# Patient Record
Sex: Male | Born: 1955 | Race: White | Hispanic: No | Marital: Married | Smoking: Current every day smoker
Health system: Southern US, Community
[De-identification: ages and names within clinical notes are randomized; demographics above are authoritative.]

## PROBLEM LIST (undated history)

## (undated) DIAGNOSIS — I10 Essential (primary) hypertension: Secondary | ICD-10-CM

## (undated) DIAGNOSIS — I4891 Unspecified atrial fibrillation: Secondary | ICD-10-CM

## (undated) DIAGNOSIS — E039 Hypothyroidism, unspecified: Secondary | ICD-10-CM

## (undated) DIAGNOSIS — E669 Obesity, unspecified: Secondary | ICD-10-CM

## (undated) DIAGNOSIS — I2510 Atherosclerotic heart disease of native coronary artery without angina pectoris: Secondary | ICD-10-CM

## (undated) DIAGNOSIS — E119 Type 2 diabetes mellitus without complications: Secondary | ICD-10-CM

## (undated) DIAGNOSIS — R519 Headache, unspecified: Secondary | ICD-10-CM

## (undated) DIAGNOSIS — R51 Headache: Secondary | ICD-10-CM

## (undated) DIAGNOSIS — Z91030 Bee allergy status: Secondary | ICD-10-CM

## (undated) DIAGNOSIS — F419 Anxiety disorder, unspecified: Secondary | ICD-10-CM

## (undated) DIAGNOSIS — H3530 Unspecified macular degeneration: Secondary | ICD-10-CM

## (undated) DIAGNOSIS — M1990 Unspecified osteoarthritis, unspecified site: Secondary | ICD-10-CM

## (undated) DIAGNOSIS — I779 Disorder of arteries and arterioles, unspecified: Secondary | ICD-10-CM

## (undated) DIAGNOSIS — E785 Hyperlipidemia, unspecified: Secondary | ICD-10-CM

## (undated) DIAGNOSIS — I480 Paroxysmal atrial fibrillation: Secondary | ICD-10-CM

## (undated) HISTORY — DX: Unspecified macular degeneration: H35.30

## (undated) HISTORY — DX: Hypothyroidism, unspecified: E03.9

## (undated) HISTORY — PX: GANGLION CYST EXCISION: SHX1691

## (undated) HISTORY — DX: Hyperlipidemia, unspecified: E78.5

## (undated) HISTORY — DX: Unspecified osteoarthritis, unspecified site: M19.90

## (undated) HISTORY — DX: Bee allergy status: Z91.030

## (undated) HISTORY — PX: POLYPECTOMY: SHX149

## (undated) HISTORY — DX: Essential (primary) hypertension: I10

## (undated) HISTORY — DX: Anxiety disorder, unspecified: F41.9

## (undated) HISTORY — DX: Atherosclerotic heart disease of native coronary artery without angina pectoris: I25.10

## (undated) HISTORY — DX: Paroxysmal atrial fibrillation: I48.0

## (undated) HISTORY — DX: Obesity, unspecified: E66.9

## (undated) HISTORY — DX: Unspecified atrial fibrillation: I48.91

## (undated) HISTORY — PX: KNEE ARTHROSCOPY: SHX127

## (undated) HISTORY — PX: COLONOSCOPY: SHX174

## (undated) HISTORY — PX: CARDIAC CATHETERIZATION: SHX172

---

## 1997-07-19 ENCOUNTER — Ambulatory Visit (HOSPITAL_COMMUNITY): Admission: RE | Admit: 1997-07-19 | Discharge: 1997-07-19 | Payer: Self-pay | Admitting: Internal Medicine

## 1998-05-27 ENCOUNTER — Emergency Department (HOSPITAL_COMMUNITY): Admission: EM | Admit: 1998-05-27 | Discharge: 1998-05-28 | Payer: Self-pay | Admitting: Emergency Medicine

## 1998-07-25 ENCOUNTER — Encounter: Payer: Self-pay | Admitting: Internal Medicine

## 1998-07-25 ENCOUNTER — Ambulatory Visit (HOSPITAL_COMMUNITY): Admission: RE | Admit: 1998-07-25 | Discharge: 1998-07-25 | Payer: Self-pay | Admitting: Internal Medicine

## 1999-07-31 ENCOUNTER — Encounter: Payer: Self-pay | Admitting: Internal Medicine

## 1999-07-31 ENCOUNTER — Ambulatory Visit (HOSPITAL_COMMUNITY): Admission: RE | Admit: 1999-07-31 | Discharge: 1999-07-31 | Payer: Self-pay | Admitting: Internal Medicine

## 2000-08-03 ENCOUNTER — Ambulatory Visit (HOSPITAL_COMMUNITY): Admission: RE | Admit: 2000-08-03 | Discharge: 2000-08-03 | Payer: Self-pay | Admitting: Internal Medicine

## 2000-08-03 ENCOUNTER — Encounter: Payer: Self-pay | Admitting: Internal Medicine

## 2001-09-05 ENCOUNTER — Encounter: Payer: Self-pay | Admitting: Internal Medicine

## 2001-09-05 ENCOUNTER — Ambulatory Visit (HOSPITAL_COMMUNITY): Admission: RE | Admit: 2001-09-05 | Discharge: 2001-09-05 | Payer: Self-pay | Admitting: Internal Medicine

## 2001-10-09 ENCOUNTER — Inpatient Hospital Stay (HOSPITAL_COMMUNITY): Admission: EM | Admit: 2001-10-09 | Discharge: 2001-10-10 | Payer: Self-pay | Admitting: Emergency Medicine

## 2001-11-13 ENCOUNTER — Ambulatory Visit (HOSPITAL_COMMUNITY): Admission: RE | Admit: 2001-11-13 | Discharge: 2001-11-13 | Payer: Self-pay | Admitting: Gastroenterology

## 2002-11-07 ENCOUNTER — Inpatient Hospital Stay (HOSPITAL_COMMUNITY): Admission: EM | Admit: 2002-11-07 | Discharge: 2002-11-08 | Payer: Self-pay | Admitting: Emergency Medicine

## 2003-10-14 ENCOUNTER — Emergency Department (HOSPITAL_COMMUNITY): Admission: EM | Admit: 2003-10-14 | Discharge: 2003-10-14 | Payer: Self-pay | Admitting: Emergency Medicine

## 2004-03-24 ENCOUNTER — Ambulatory Visit: Payer: Self-pay | Admitting: Internal Medicine

## 2004-06-24 ENCOUNTER — Ambulatory Visit: Payer: Self-pay | Admitting: Internal Medicine

## 2004-08-24 ENCOUNTER — Ambulatory Visit: Payer: Self-pay | Admitting: Internal Medicine

## 2004-08-28 ENCOUNTER — Ambulatory Visit: Payer: Self-pay | Admitting: Internal Medicine

## 2004-11-19 ENCOUNTER — Ambulatory Visit: Payer: Self-pay | Admitting: Internal Medicine

## 2004-11-27 ENCOUNTER — Ambulatory Visit: Payer: Self-pay | Admitting: Internal Medicine

## 2005-03-29 ENCOUNTER — Ambulatory Visit: Payer: Self-pay | Admitting: Internal Medicine

## 2005-06-28 ENCOUNTER — Ambulatory Visit: Payer: Self-pay | Admitting: Internal Medicine

## 2005-07-30 ENCOUNTER — Ambulatory Visit: Payer: Self-pay | Admitting: Internal Medicine

## 2005-09-01 ENCOUNTER — Ambulatory Visit (HOSPITAL_COMMUNITY): Admission: RE | Admit: 2005-09-01 | Discharge: 2005-09-01 | Payer: Self-pay | Admitting: Specialist

## 2005-11-05 ENCOUNTER — Ambulatory Visit: Payer: Self-pay | Admitting: Internal Medicine

## 2006-02-08 ENCOUNTER — Ambulatory Visit: Payer: Self-pay | Admitting: Internal Medicine

## 2006-02-08 LAB — CONVERTED CEMR LAB
ALT: 35 units/L (ref 0–40)
AST: 28 units/L (ref 0–37)
Albumin: 4.4 g/dL (ref 3.5–5.2)
Alkaline Phosphatase: 81 units/L (ref 39–117)
BUN: 7 mg/dL (ref 6–23)
CO2: 28 meq/L (ref 19–32)
Calcium: 10 mg/dL (ref 8.4–10.5)
Chloride: 104 meq/L (ref 96–112)
Chol/HDL Ratio, serum: 4.7
Cholesterol: 172 mg/dL (ref 0–200)
Creatinine, Ser: 1 mg/dL (ref 0.4–1.5)
GFR calc non Af Amer: 84 mL/min
Glomerular Filtration Rate, Af Am: 102 mL/min/{1.73_m2}
Glucose, Bld: 156 mg/dL — ABNORMAL HIGH (ref 70–99)
HDL: 36.4 mg/dL — ABNORMAL LOW (ref >39.0)
Hgb A1c MFr Bld: 8.7 % — ABNORMAL HIGH (ref 4.6–6.0)
LDL DIRECT: 108.3 mg/dL
Potassium: 4.4 meq/L (ref 3.5–5.1)
Sodium: 141 meq/L (ref 135–145)
TSH: 2.35 microintl units/mL (ref 0.35–5.50)
Total Bilirubin: 0.7 mg/dL (ref 0.3–1.2)
Total Protein: 7.1 g/dL (ref 6.0–8.3)
Triglyceride fasting, serum: 219 mg/dL (ref 0–149)
VLDL: 44 mg/dL — ABNORMAL HIGH (ref 0–40)

## 2006-05-10 ENCOUNTER — Ambulatory Visit: Payer: Self-pay | Admitting: Internal Medicine

## 2006-05-10 LAB — CONVERTED CEMR LAB
ALT: 29 units/L (ref 0–40)
AST: 23 units/L (ref 0–37)
BUN: 10 mg/dL (ref 6–23)
Creatinine, Ser: 0.9 mg/dL (ref 0.4–1.5)
Hgb A1c MFr Bld: 10.2 % — ABNORMAL HIGH (ref 4.6–6.0)
Potassium: 4.4 meq/L (ref 3.5–5.1)
Sodium: 140 meq/L (ref 135–145)

## 2006-06-21 ENCOUNTER — Ambulatory Visit: Payer: Self-pay | Admitting: Internal Medicine

## 2006-07-06 ENCOUNTER — Ambulatory Visit: Payer: Self-pay

## 2006-08-09 ENCOUNTER — Ambulatory Visit: Payer: Self-pay | Admitting: Internal Medicine

## 2006-08-09 LAB — CONVERTED CEMR LAB
ALT: 23 units/L (ref 0–40)
AST: 20 units/L (ref 0–37)
Albumin: 3.9 g/dL (ref 3.5–5.2)
Alkaline Phosphatase: 60 units/L (ref 39–117)
BUN: 8 mg/dL (ref 6–23)
Bilirubin, Direct: 0.1 mg/dL (ref 0.0–0.3)
CO2: 26 meq/L (ref 19–32)
Calcium: 9.4 mg/dL (ref 8.4–10.5)
Chloride: 111 meq/L (ref 96–112)
Creatinine, Ser: 0.9 mg/dL (ref 0.4–1.5)
GFR calc Af Amer: 115 mL/min
GFR calc non Af Amer: 95 mL/min
Glucose, Bld: 125 mg/dL — ABNORMAL HIGH (ref 70–99)
Hgb A1c MFr Bld: 8.1 % — ABNORMAL HIGH (ref 4.6–6.0)
Potassium: 3.9 meq/L (ref 3.5–5.1)
Sodium: 142 meq/L (ref 135–145)
Total Bilirubin: 0.5 mg/dL (ref 0.3–1.2)
Total Protein: 6.3 g/dL (ref 6.0–8.3)

## 2006-11-16 ENCOUNTER — Ambulatory Visit: Payer: Self-pay | Admitting: Internal Medicine

## 2006-11-16 LAB — CONVERTED CEMR LAB
ALT: 22 units/L (ref 0–53)
Alkaline Phosphatase: 55 units/L (ref 39–117)
BUN: 8 mg/dL (ref 6–23)
Bilirubin, Direct: 0.1 mg/dL (ref 0.0–0.3)
Calcium: 9.6 mg/dL (ref 8.4–10.5)
Eosinophils Absolute: 0.2 10*3/uL (ref 0.0–0.6)
GFR calc Af Amer: 115 mL/min
GFR calc non Af Amer: 95 mL/min
HDL: 31.7 mg/dL — ABNORMAL LOW (ref >39.0)
Lymphocytes Relative: 27.5 % (ref 12.0–46.0)
MCV: 94.6 fL (ref 78.0–100.0)
Monocytes Relative: 5.3 % (ref 3.0–11.0)
Neutro Abs: 6 10*3/uL (ref 1.4–7.7)
Platelets: 173 10*3/uL (ref 150–400)
Triglycerides: 129 mg/dL (ref 0–149)

## 2007-04-04 ENCOUNTER — Ambulatory Visit: Payer: Self-pay | Admitting: Internal Medicine

## 2007-04-04 DIAGNOSIS — E669 Obesity, unspecified: Secondary | ICD-10-CM | POA: Insufficient documentation

## 2007-04-04 DIAGNOSIS — E1165 Type 2 diabetes mellitus with hyperglycemia: Secondary | ICD-10-CM | POA: Insufficient documentation

## 2007-04-04 DIAGNOSIS — R609 Edema, unspecified: Secondary | ICD-10-CM | POA: Insufficient documentation

## 2007-04-04 DIAGNOSIS — I2510 Atherosclerotic heart disease of native coronary artery without angina pectoris: Secondary | ICD-10-CM | POA: Insufficient documentation

## 2007-04-04 DIAGNOSIS — E1169 Type 2 diabetes mellitus with other specified complication: Secondary | ICD-10-CM | POA: Insufficient documentation

## 2007-04-04 DIAGNOSIS — I10 Essential (primary) hypertension: Secondary | ICD-10-CM | POA: Insufficient documentation

## 2007-04-04 DIAGNOSIS — E039 Hypothyroidism, unspecified: Secondary | ICD-10-CM | POA: Insufficient documentation

## 2007-04-05 LAB — CONVERTED CEMR LAB
ALT: 23 units/L (ref 0–53)
Albumin: 4.2 g/dL (ref 3.5–5.2)
Alkaline Phosphatase: 56 units/L (ref 39–117)
BUN: 10 mg/dL (ref 6–23)
CO2: 25 meq/L (ref 19–32)
Calcium: 9.9 mg/dL (ref 8.4–10.5)
Creatinine, Ser: 0.9 mg/dL (ref 0.4–1.5)
GFR calc Af Amer: 115 mL/min
Total Protein: 6.7 g/dL (ref 6.0–8.3)

## 2007-08-01 ENCOUNTER — Ambulatory Visit: Payer: Self-pay | Admitting: Internal Medicine

## 2007-08-01 LAB — CONVERTED CEMR LAB
CO2: 30 meq/L (ref 19–32)
Calcium: 9.9 mg/dL (ref 8.4–10.5)
Chloride: 105 meq/L (ref 96–112)
Glucose, Bld: 136 mg/dL — ABNORMAL HIGH (ref 70–99)
Sodium: 142 meq/L (ref 135–145)

## 2007-08-02 ENCOUNTER — Ambulatory Visit: Payer: Self-pay | Admitting: Internal Medicine

## 2007-08-02 DIAGNOSIS — T63441A Toxic effect of venom of bees, accidental (unintentional), initial encounter: Secondary | ICD-10-CM | POA: Insufficient documentation

## 2007-11-09 ENCOUNTER — Emergency Department (HOSPITAL_COMMUNITY): Admission: EM | Admit: 2007-11-09 | Discharge: 2007-11-09 | Payer: Self-pay | Admitting: Emergency Medicine

## 2007-11-20 ENCOUNTER — Encounter: Payer: Self-pay | Admitting: Internal Medicine

## 2007-12-13 ENCOUNTER — Ambulatory Visit: Payer: Self-pay | Admitting: Internal Medicine

## 2007-12-13 DIAGNOSIS — J209 Acute bronchitis, unspecified: Secondary | ICD-10-CM | POA: Insufficient documentation

## 2007-12-13 LAB — CONVERTED CEMR LAB
CO2: 33 meq/L — ABNORMAL HIGH (ref 19–32)
Calcium: 10 mg/dL (ref 8.4–10.5)
Chloride: 108 meq/L (ref 96–112)
Glucose, Bld: 107 mg/dL — ABNORMAL HIGH (ref 70–99)
Hgb A1c MFr Bld: 7.3 % — ABNORMAL HIGH (ref 4.6–6.0)
Potassium: 4.8 meq/L (ref 3.5–5.1)
Sodium: 144 meq/L (ref 135–145)

## 2008-02-12 ENCOUNTER — Telehealth: Payer: Self-pay | Admitting: Internal Medicine

## 2008-04-25 ENCOUNTER — Ambulatory Visit: Payer: Self-pay | Admitting: Internal Medicine

## 2008-04-30 LAB — CONVERTED CEMR LAB
CO2: 31 meq/L (ref 19–32)
Chloride: 100 meq/L (ref 96–112)
Glucose, Bld: 121 mg/dL — ABNORMAL HIGH (ref 70–99)
Hgb A1c MFr Bld: 7.6 % — ABNORMAL HIGH (ref 4.6–6.0)
Potassium: 4.5 meq/L (ref 3.5–5.1)
Sodium: 142 meq/L (ref 135–145)

## 2008-07-04 ENCOUNTER — Telehealth: Payer: Self-pay | Admitting: Internal Medicine

## 2008-09-27 ENCOUNTER — Ambulatory Visit: Payer: Self-pay | Admitting: Internal Medicine

## 2008-09-30 LAB — CONVERTED CEMR LAB
ALT: 26 units/L (ref 0–53)
Bilirubin, Direct: 0.1 mg/dL (ref 0.0–0.3)
CO2: 29 meq/L (ref 19–32)
Direct LDL: 176.5 mg/dL
Glucose, Bld: 147 mg/dL — ABNORMAL HIGH (ref 70–99)
HDL: 40.3 mg/dL (ref >39.00)
Hgb A1c MFr Bld: 6.9 % — ABNORMAL HIGH (ref 4.6–6.5)
Potassium: 4.6 meq/L (ref 3.5–5.1)
Sodium: 141 meq/L (ref 135–145)
Total Bilirubin: 0.5 mg/dL (ref 0.3–1.2)
Total Protein: 6.8 g/dL (ref 6.0–8.3)
Triglycerides: 145 mg/dL (ref 0.0–149.0)
VLDL: 29 mg/dL (ref 0.0–40.0)

## 2009-01-17 ENCOUNTER — Telehealth: Payer: Self-pay | Admitting: Internal Medicine

## 2009-01-30 ENCOUNTER — Ambulatory Visit: Payer: Self-pay | Admitting: Internal Medicine

## 2009-02-03 LAB — CONVERTED CEMR LAB
ALT: 21 units/L (ref 0–53)
BUN: 10 mg/dL (ref 6–23)
Bilirubin, Direct: 0.1 mg/dL (ref 0.0–0.3)
Chloride: 103 meq/L (ref 96–112)
Cholesterol: 171 mg/dL (ref 0–200)
Creatinine, Ser: 0.8 mg/dL (ref 0.4–1.5)
GFR calc non Af Amer: 107.66 mL/min (ref >60)
Hgb A1c MFr Bld: 11.7 % — ABNORMAL HIGH (ref 4.6–6.5)
LDL Cholesterol: 109 mg/dL — ABNORMAL HIGH (ref 0–99)
Potassium: 4.1 meq/L (ref 3.5–5.1)
Total Bilirubin: 0.6 mg/dL (ref 0.3–1.2)
Total CHOL/HDL Ratio: 5
Triglycerides: 146 mg/dL (ref 0.0–149.0)
VLDL: 29.2 mg/dL (ref 0.0–40.0)

## 2009-06-05 ENCOUNTER — Ambulatory Visit: Payer: Self-pay | Admitting: Internal Medicine

## 2009-06-05 ENCOUNTER — Encounter: Payer: Self-pay | Admitting: Internal Medicine

## 2009-06-05 DIAGNOSIS — F17200 Nicotine dependence, unspecified, uncomplicated: Secondary | ICD-10-CM

## 2009-06-06 LAB — CONVERTED CEMR LAB
Albumin: 4.2 g/dL (ref 3.5–5.2)
BUN: 12 mg/dL (ref 6–23)
Calcium: 9.9 mg/dL (ref 8.4–10.5)
Cholesterol: 163 mg/dL (ref 0–200)
Creatinine, Ser: 1.1 mg/dL (ref 0.4–1.5)
GFR calc non Af Amer: 74.46 mL/min (ref >60)
Glucose, Bld: 216 mg/dL — ABNORMAL HIGH (ref 70–99)
HDL: 42.4 mg/dL (ref >39.00)
LDL Cholesterol: 90 mg/dL (ref 0–99)
Total Bilirubin: 0.7 mg/dL (ref 0.3–1.2)
Triglycerides: 151 mg/dL — ABNORMAL HIGH (ref 0.0–149.0)
VLDL: 30.2 mg/dL (ref 0.0–40.0)

## 2009-07-23 ENCOUNTER — Telehealth: Payer: Self-pay | Admitting: Internal Medicine

## 2009-08-29 ENCOUNTER — Ambulatory Visit: Payer: Self-pay | Admitting: Internal Medicine

## 2009-08-29 LAB — CONVERTED CEMR LAB
Albumin: 4.4 g/dL (ref 3.5–5.2)
Alkaline Phosphatase: 55 units/L (ref 39–117)
BUN: 17 mg/dL (ref 6–23)
CO2: 32 meq/L (ref 19–32)
Calcium: 10.1 mg/dL (ref 8.4–10.5)
GFR calc non Af Amer: 77.64 mL/min (ref >60)
Glucose, Bld: 134 mg/dL — ABNORMAL HIGH (ref 70–99)
Sodium: 142 meq/L (ref 135–145)
TSH: 24.67 microintl units/mL — ABNORMAL HIGH (ref 0.35–5.50)
Total Protein: 6.8 g/dL (ref 6.0–8.3)

## 2009-09-05 ENCOUNTER — Ambulatory Visit: Payer: Self-pay | Admitting: Internal Medicine

## 2009-10-17 ENCOUNTER — Ambulatory Visit: Payer: Self-pay | Admitting: Internal Medicine

## 2009-10-17 LAB — CONVERTED CEMR LAB: TSH: 0.21 microintl units/mL — ABNORMAL LOW (ref 0.35–5.50)

## 2009-12-04 ENCOUNTER — Ambulatory Visit: Payer: Self-pay | Admitting: Internal Medicine

## 2009-12-05 ENCOUNTER — Ambulatory Visit: Payer: Self-pay | Admitting: Internal Medicine

## 2009-12-05 LAB — CONVERTED CEMR LAB
CO2: 28 meq/L (ref 19–32)
Calcium: 10.1 mg/dL (ref 8.4–10.5)
Creatinine, Ser: 1 mg/dL (ref 0.4–1.5)
GFR calc non Af Amer: 85.92 mL/min (ref >60)
Glucose, Bld: 147 mg/dL — ABNORMAL HIGH (ref 70–99)
Hgb A1c MFr Bld: 8.5 % — ABNORMAL HIGH (ref 4.6–6.5)

## 2009-12-10 ENCOUNTER — Telehealth: Payer: Self-pay | Admitting: Internal Medicine

## 2010-03-09 ENCOUNTER — Ambulatory Visit: Payer: Self-pay | Admitting: Internal Medicine

## 2010-03-09 DIAGNOSIS — M25519 Pain in unspecified shoulder: Secondary | ICD-10-CM | POA: Insufficient documentation

## 2010-03-09 DIAGNOSIS — M542 Cervicalgia: Secondary | ICD-10-CM | POA: Insufficient documentation

## 2010-03-11 LAB — CONVERTED CEMR LAB
BUN: 11 mg/dL (ref 6–23)
CO2: 29 meq/L (ref 19–32)
Chloride: 101 meq/L (ref 96–112)
Glucose, Bld: 152 mg/dL — ABNORMAL HIGH (ref 70–99)
Potassium: 4.6 meq/L (ref 3.5–5.1)

## 2010-04-08 ENCOUNTER — Ambulatory Visit
Admission: RE | Admit: 2010-04-08 | Discharge: 2010-04-08 | Payer: Self-pay | Source: Home / Self Care | Attending: Endocrinology | Admitting: Endocrinology

## 2010-04-08 ENCOUNTER — Encounter: Payer: Self-pay | Admitting: Endocrinology

## 2010-04-23 NOTE — Progress Notes (Signed)
Summary: Chantix  Phone Note Refill Request Message from:  Fax from Pharmacy  Refills Requested: Medication #1:  Chantix starting month pak ( not on active list) please advise if ok to fill....sig/quantity  *****[facility]   Method Requested: Electronic Initial call taken by: Lanier Prude, [facility]),  December 10, 2009 5:12 PM  Follow-up for Phone Call        ok to fill - read warnings re: heart etc OK to take Follow-up by: Tresa Garter MD,  December 10, 2009 5:47 PM  Additional Follow-up for Phone Call Additional follow up Details #1::        left detailed mess informing pt of above Additional Follow-up by: Lanier Prude, [facility]),  December 11, 2009 8:21 AM    New/Updated Medications: CHANTIX STARTING MONTH PAK 0.5 MG X 11 & 1 MG X 42 TABS (VARENICLINE TARTRATE) as dirrected CHANTIX CONTINUING MONTH PAK 1 MG TABS (VARENICLINE TARTRATE) as dirrected Prescriptions: CHANTIX CONTINUING MONTH PAK 1 MG TABS (VARENICLINE TARTRATE) as dirrected  #1 x 5   Entered and Authorized by:   Tresa Garter MD   Signed by:   Lanier Prude, CMA(AAMA) on 12/11/2009   Method used:   Electronically to        [facility]  [address] [id number]* (retail)       [address]       [place], [place]  [address]       Ph: [phone] or [phone]       Fax: [phone]   RxID:   [id number] CHANTIX STARTING MONTH PAK 0.5 MG X 11 & 1 MG X 42 TABS (VARENICLINE TARTRATE) as dirrected  #1 x 0   Entered and Authorized by:   Tresa Garter MD   Signed by:   Lanier Prude, CMA(AAMA) on 12/11/2009   Method used:   Electronically to        [facility]  [address] [facility]* (retail)       [address]       [place], [place]  [address]       Ph: [phone] or [phone]       Fax: [phone]   RxID:   [id number]

## 2010-04-23 NOTE — Assessment & Plan Note (Signed)
Summary: 3 mos f/u #/cd   Vital Signs:  Patient profile:   55 year old male Height:      65 inches Weight:      239 pounds BMI:     39.92 O2 Sat:      96 % on Room air Temp:     97.4 degrees F oral Pulse rate:   64 / minute BP sitting:   122 / 68  (left arm) Cuff size:   large  Vitals Entered By: Bill Salinas CMA (December 05, 2009 7:36 AM)  O2 Flow:  Room air CC: 3 month follow up/ ab   CC:  3 month follow up/ ab.  History of Present Illness: The patient presents for a follow up of hypertension, diabetes, hyperlipidemia Lost 8 lbs!  Current Medications (verified): 1)  Lisinopril 40 Mg Tabs (Lisinopril) .Marland Kitchen.. 1 Once Daily 2)  Simvastatin 40 Mg Tabs (Simvastatin) .... One Once Daily 3)  Glucophage Xr 500 Mg  Tb24 (Metformin Hcl) .... 2 Tabs By Mouth Bid 4)  Glipizide 10 Mg  Tabs (Glipizide) .Marland Kitchen.. 1 By Mouth Bid 5)  Actos 45 Mg Tabs (Pioglitazone Hcl) .Marland Kitchen.. 1 By Mouth Qd 6)  Epipen 2-Pak 0.3 Mg/0.34ml (1:1000)  Devi (Epinephrine Hcl (Anaphylaxis)) .... As Dirr. As Needed Bee Sting 7)  Vitamin D3 1000 Unit  Tabs (Cholecalciferol) .Marland Kitchen.. 1 Qd 8)  Aspir-Low 81 Mg Tbec (Aspirin) .Marland Kitchen.. 1 Once Daily After Meal 9)  Tenoretic 100 100-25 Mg  Tabs (Atenolol-Chlorthalidone) .Marland Kitchen.. 1 By Mouth Q Am 10)  Prednisone 10 Mg  Tabs (Prednisone) .... Take 6 Tabs Prn 11)  Sudafed 30 Mg  Tabs (Pseudoephedrine Hcl) .... 2 By Mouth As Needed Allergy 12)  Synthroid 200 Mcg Tabs (Levothyroxine Sodium) .Marland Kitchen.. 1 By Mouth Once Daily As Dirr Ac For Thyroid  Allergies (verified): No Known Drug Allergies  Past History:  Past Medical History: Last updated: 04/04/2007 Coronary artery disease Diabetes mellitus, type II Hypertension Hypothyroidism Allergy to bee stings Macular degeneration  Past Surgical History: Last updated: 09/05/2009 Denies surgical history  Social History: Last updated: 06/05/2009 Occupation: [employer] Married Current Smoker 2 ppd  Review of Systems       The patient complains of  dyspnea on exertion.  The patient denies fever, chest pain, and abdominal pain.         Lost wt!  Physical Exam  General:  overweight-appearing.   Nose:  External nasal examination shows no deformity or inflammation. Nasal mucosa are pink and moist without lesions or exudates. Mouth:  Oral mucosa and oropharynx without lesions or exudates.  Teeth in good repair. Lungs:  Normal respiratory effort, chest expands symmetrically. Lungs are clear to auscultation, no crackles or wheezes. Heart:  Normal rate and regular rhythm. S1 and S2 normal without gallop, murmur, click, rub or other extra sounds. Abdomen:  Bowel sounds positive,abdomen soft and non-tender without masses, organomegaly or hernias noted. Msk:  No deformity or scoliosis noted of thoracic or lumbar spine.   Neurologic:  No cranial nerve deficits noted. Station and gait are normal. Plantar reflexes are down-going bilaterally. DTRs are symmetrical throughout. Sensory, motor and coordinative functions appear intact. Skin:  Intact without suspicious lesions or rashes Psych:  Cognition and judgment appear intact. Alert and cooperative with normal attention span and concentration. No apparent delusions, illusions, hallucinations   Impression & Recommendations:  Problem # 1:  HYPOTHYROIDISM (ICD-244.9) Assessment Improved  The following medications were removed from the medication list:    Synthroid 200 Mcg Tabs (  Levothyroxine sodium) .Marland Kitchen... 1 by mouth once daily as dirr ac for thyroid His updated medication list for this problem includes:    Synthroid 175 Mcg Tabs (Levothyroxine sodium) .Marland Kitchen... 1 by mouth qd  Labs Reviewed: TSH: 0.21 (10/17/2009)    HgBA1c: 8.9 (08/29/2009) Chol: 163 (06/05/2009)   HDL: 42.40 (06/05/2009)   LDL: 90 (06/05/2009)   TG: 151.0 (06/05/2009)  Problem # 2:  DIABETES MELLITUS, TYPE II (ICD-250.00) Assessment: Improved  His updated medication list for this problem includes:    Lisinopril 40 Mg Tabs  (Lisinopril) .Marland Kitchen... 1 once daily    Glucophage Xr 500 Mg Tb24 (Metformin hcl) .Marland Kitchen... 2 tabs by mouth bid    Glipizide 10 Mg Tabs (Glipizide) .Marland Kitchen... 1 by mouth bid    Actos 45 Mg Tabs (Pioglitazone hcl) .Marland Kitchen... 1 by mouth qd    Aspir-low 81 Mg Tbec (Aspirin) .Marland Kitchen... 1 once daily after meal  Labs Reviewed: Creat: 1.1 (08/29/2009)    Reviewed HgBA1c results: 8.9 (08/29/2009)  9.6 (06/05/2009)  Problem # 3:  CORONARY ARTERY DISEASE (ICD-414.00) Assessment: Unchanged  His updated medication list for this problem includes:    Lisinopril 40 Mg Tabs (Lisinopril) .Marland Kitchen... 1 once daily    Aspir-low 81 Mg Tbec (Aspirin) .Marland Kitchen... 1 once daily after meal    Tenoretic 100 100-25 Mg Tabs (Atenolol-chlorthalidone) .Marland Kitchen... 1 by mouth q am  Problem # 4:  TOBACCO USER (ICD-305.1) Assessment: Unchanged  2 ppd  Encouraged smoking cessation and discussed different methods for smoking cessation.   Complete Medication List: 1)  Lisinopril 40 Mg Tabs (Lisinopril) .Marland Kitchen.. 1 once daily 2)  Simvastatin 40 Mg Tabs (Simvastatin) .... One once daily 3)  Glucophage Xr 500 Mg Tb24 (Metformin hcl) .... 2 tabs by mouth bid 4)  Glipizide 10 Mg Tabs (Glipizide) .Marland Kitchen.. 1 by mouth bid 5)  Actos 45 Mg Tabs (Pioglitazone hcl) .Marland Kitchen.. 1 by mouth qd 6)  Epipen 2-pak 0.3 Mg/0.43ml (1:1000) Devi (Epinephrine hcl (anaphylaxis)) .... As dirr. as needed bee sting 7)  Vitamin D3 1000 Unit Tabs (Cholecalciferol) .Marland Kitchen.. 1 qd 8)  Aspir-low 81 Mg Tbec (Aspirin) .Marland Kitchen.. 1 once daily after meal 9)  Tenoretic 100 100-25 Mg Tabs (Atenolol-chlorthalidone) .Marland Kitchen.. 1 by mouth q am 10)  Prednisone 10 Mg Tabs (Prednisone) .... Take 6 tabs prn 11)  Sudafed 30 Mg Tabs (Pseudoephedrine hcl) .... 2 by mouth as needed allergy 12)  Synthroid 175 Mcg Tabs (Levothyroxine sodium) .Marland Kitchen.. 1 by mouth qd  Other Orders: Admin 1st Vaccine (19147) Flu Vaccine 79yrs + (82956)  Patient Instructions: 1)  Please schedule a follow-up appointment in 3 months. 2)  BMP prior to visit,  ICD-9: 3)  Lipid Panel prior to visit, ICD-9:250.00 244.80 4)  TSH prior to visit, ICD-9: 5)  HbgA1C prior to visit, ICD-9: Prescriptions: SYNTHROID 175 MCG TABS (LEVOTHYROXINE SODIUM) 1 by mouth qd  #30 x 12   Entered and Authorized by:   Tresa Garter MD   Signed by:   Tresa Garter MD on 12/05/2009   Method used:   Print then Give to Patient   RxID:   [id number]    Immunization History:  Influenza Immunization History:    Influenza:  fluvax 3+ (12/05/2009)   Flu Vaccine Consent Questions     Do you have a history of severe allergic reactions to this vaccine? no    Any prior history of allergic reactions to egg and/or gelatin? no    Do you have a sensitivity to the preservative Thimersol? no  Do you have a past history of Guillan-Barre Syndrome? no    Do you currently have an acute febrile illness? no    Have you ever had a severe reaction to latex? no    Vaccine information given and explained to patient? yes    Are you currently pregnant? no    Lot Number:[id number]   Exp Date:09/19/2010   Site Given  Left Deltoid IMlbflu

## 2010-04-23 NOTE — Assessment & Plan Note (Signed)
Summary: 4 MTH FU  STC   Vital Signs:  Patient profile:   55 year old male Weight:      245 pounds BMI:     40.92 Temp:     98.2 degrees F oral Pulse rate:   66 / minute BP sitting:   110 / 66  (left arm)  Vitals Entered By: Tora Perches (June 05, 2009 8:57 AM) CC: f/u Is Patient Diabetic? Yes   CC:  f/u.  History of Present Illness: The patient presents for a follow up of hypertension, diabetes, hyperlipidemia  Has put some wt back on  Preventive Screening-Counseling & Management  Alcohol-Tobacco     Smoking Status: current  Current Medications (verified): 1)  Lisinopril 40 Mg Tabs (Lisinopril) .Marland Kitchen.. 1 Once Daily 2)  Levoxyl 150 Mcg Tabs (Levothyroxine Sodium) .Marland Kitchen.. 1 Once Daily 3)  Simvastatin 40 Mg Tabs (Simvastatin) .... One Once Daily 4)  Glucophage Xr 500 Mg  Tb24 (Metformin Hcl) .... 2 Tabs By Mouth Bid 5)  Glipizide 10 Mg  Tabs (Glipizide) .Marland Kitchen.. 1 By Mouth Bid 6)  Actos 45 Mg Tabs (Pioglitazone Hcl) .Marland Kitchen.. 1 By Mouth Qd 7)  Epipen 2-Pak 0.3 Mg/0.88ml (1:1000)  Devi (Epinephrine Hcl (Anaphylaxis)) .... As Dirr. As Needed Bee Sting 8)  Vitamin D3 1000 Unit  Tabs (Cholecalciferol) .Marland Kitchen.. 1 Qd 9)  Aspir-Low 81 Mg Tbec (Aspirin) .Marland Kitchen.. 1 Once Daily After Meal 10)  Tenoretic 100 100-25 Mg  Tabs (Atenolol-Chlorthalidone) .Marland Kitchen.. 1 By Mouth Q Am 11)  Prednisone 10 Mg  Tabs (Prednisone) .... Take 6 Tabs Prn 12)  Sudafed 30 Mg  Tabs (Pseudoephedrine Hcl) .... 2 By Mouth As Needed Allergy  Allergies (verified): No Known Drug Allergies  Social History: Occupation: [employer] Married Current Smoker 2 ppd  Review of Systems       The patient complains of weight gain and dyspnea on exertion.  The patient denies chest pain and abdominal pain.    Physical Exam  General:  overweight-appearing.   Mouth:  Oral mucosa and oropharynx without lesions or exudates.  Teeth in good repair. Lungs:  Normal respiratory effort, chest expands symmetrically. Lungs are clear to auscultation, no  crackles or wheezes. Heart:  Normal rate and regular rhythm. S1 and S2 normal without gallop, murmur, click, rub or other extra sounds. Abdomen:  Bowel sounds positive,abdomen soft and non-tender without masses, organomegaly or hernias noted. Msk:  No deformity or scoliosis noted of thoracic or lumbar spine.   Extremities:  No clubbing, cyanosis, edema, or deformity noted with normal full range of motion of all joints.   Neurologic:  No cranial nerve deficits noted. Station and gait are normal. Plantar reflexes are down-going bilaterally. DTRs are symmetrical throughout. Sensory, motor and coordinative functions appear intact. Skin:  Intact without suspicious lesions or rashes Psych:  Cognition and judgment appear intact. Alert and cooperative with normal attention span and concentration. No apparent delusions, illusions, hallucinations   Impression & Recommendations:  Problem # 1:  DIABETES MELLITUS, TYPE II (ICD-250.00) Assessment Improved  His updated medication list for this problem includes:    Lisinopril 40 Mg Tabs (Lisinopril) .Marland Kitchen... 1 once daily    Glucophage Xr 500 Mg Tb24 (Metformin hcl) .Marland Kitchen... 2 tabs by mouth bid    Glipizide 10 Mg Tabs (Glipizide) .Marland Kitchen... 1 by mouth bid    Actos 45 Mg Tabs (Pioglitazone hcl) .Marland Kitchen... 1 by mouth qd    Aspir-low 81 Mg Tbec (Aspirin) .Marland Kitchen... 1 once daily after meal  Labs Reviewed: Creat:  0.8 (01/30/2009)    Reviewed HgBA1c results: 11.7 (01/30/2009)  6.9 (09/27/2008)  Problem # 2:  HYPOTHYROIDISM (ICD-244.9) Assessment: Improved  His updated medication list for this problem includes:    Levoxyl 150 Mcg Tabs (Levothyroxine sodium) .Marland Kitchen... 1 once daily  Problem # 3:  HYPERTENSION (ICD-401.9) Assessment: Improved  His updated medication list for this problem includes:    Lisinopril 40 Mg Tabs (Lisinopril) .Marland Kitchen... 1 once daily    Tenoretic 100 100-25 Mg Tabs (Atenolol-chlorthalidone) .Marland Kitchen... 1 by mouth q am  BP today: 110/66 Prior BP: 166/104  (01/30/2009)  Labs Reviewed: K+: 4.1 (01/30/2009) Creat: : 0.8 (01/30/2009)   Chol: 171 (01/30/2009)   HDL: 32.90 (01/30/2009)   LDL: 109 (01/30/2009)   TG: 146.0 (01/30/2009)  Problem # 4:  CORONARY ARTERY DISEASE (ICD-414.00) Assessment: Unchanged  His updated medication list for this problem includes:    Lisinopril 40 Mg Tabs (Lisinopril) .Marland Kitchen... 1 once daily    Aspir-low 81 Mg Tbec (Aspirin) .Marland Kitchen... 1 once daily after meal    Tenoretic 100 100-25 Mg Tabs (Atenolol-chlorthalidone) .Marland Kitchen... 1 by mouth q am  Problem # 5:  OBESITY (ICD-278.00) Assessment: Deteriorated See "Patient Instructions".   Problem # 6:  TOBACCO USER (ICD-305.1) Assessment: Unchanged  Encouraged smoking cessation and discussed different methods for smoking cessation.   Complete Medication List: 1)  Lisinopril 40 Mg Tabs (Lisinopril) .Marland Kitchen.. 1 once daily 2)  Levoxyl 150 Mcg Tabs (Levothyroxine sodium) .Marland Kitchen.. 1 once daily 3)  Simvastatin 40 Mg Tabs (Simvastatin) .... One once daily 4)  Glucophage Xr 500 Mg Tb24 (Metformin hcl) .... 2 tabs by mouth bid 5)  Glipizide 10 Mg Tabs (Glipizide) .Marland Kitchen.. 1 by mouth bid 6)  Actos 45 Mg Tabs (Pioglitazone hcl) .Marland Kitchen.. 1 by mouth qd 7)  Epipen 2-pak 0.3 Mg/0.70ml (1:1000) Devi (Epinephrine hcl (anaphylaxis)) .... As dirr. as needed bee sting 8)  Vitamin D3 1000 Unit Tabs (Cholecalciferol) .Marland Kitchen.. 1 qd 9)  Aspir-low 81 Mg Tbec (Aspirin) .Marland Kitchen.. 1 once daily after meal 10)  Tenoretic 100 100-25 Mg Tabs (Atenolol-chlorthalidone) .Marland Kitchen.. 1 by mouth q am 11)  Prednisone 10 Mg Tabs (Prednisone) .... Take 6 tabs prn 12)  Sudafed 30 Mg Tabs (Pseudoephedrine hcl) .... 2 by mouth as needed allergy  Patient Instructions: 1)  Please schedule a follow-up appointment in 3 months. Loose weight! 2)  BMP prior to visit, ICD-9: 3)  Hepatic Panel prior to visit, ICD-9: 4)  TSH prior to visit, ICD-9: 250.02 5)  HbgA1C prior to visit, ICD-9:

## 2010-04-23 NOTE — Assessment & Plan Note (Signed)
Summary: 3 MO ROV /NWS  #   Vital Signs:  Patient profile:   55 year old male Height:      65 inches (165.10 cm) Weight:      247.25 pounds (112.39 kg) BMI:     41.29 O2 Sat:      97 % on Room air Temp:     97.9 degrees F (36.61 degrees C) oral Pulse rate:   55 / minute BP sitting:   120 / 80  (left arm) Cuff size:   large  Vitals Entered By: Lucious Groves (September 05, 2009 8:14 AM)  O2 Flow:  Room air CC: 3 mo rtn ov./kb Is Patient Diabetic? Yes Pain Assessment Patient in pain? no        CC:  3 mo rtn ov./kb.  History of Present Illness: The patient presents for a follow up of hypertension, diabetes, hyperlipidemia   Current Medications (verified): 1)  Lisinopril 40 Mg Tabs (Lisinopril) .Marland Kitchen.. 1 Once Daily 2)  Levoxyl 150 Mcg Tabs (Levothyroxine Sodium) .Marland Kitchen.. 1 Once Daily 3)  Simvastatin 40 Mg Tabs (Simvastatin) .... One Once Daily 4)  Glucophage Xr 500 Mg  Tb24 (Metformin Hcl) .... 2 Tabs By Mouth Bid 5)  Glipizide 10 Mg  Tabs (Glipizide) .Marland Kitchen.. 1 By Mouth Bid 6)  Actos 45 Mg Tabs (Pioglitazone Hcl) .Marland Kitchen.. 1 By Mouth Qd 7)  Epipen 2-Pak 0.3 Mg/0.9ml (1:1000)  Devi (Epinephrine Hcl (Anaphylaxis)) .... As Dirr. As Needed Bee Sting 8)  Vitamin D3 1000 Unit  Tabs (Cholecalciferol) .Marland Kitchen.. 1 Qd 9)  Aspir-Low 81 Mg Tbec (Aspirin) .Marland Kitchen.. 1 Once Daily After Meal 10)  Tenoretic 100 100-25 Mg  Tabs (Atenolol-Chlorthalidone) .Marland Kitchen.. 1 By Mouth Q Am 11)  Prednisone 10 Mg  Tabs (Prednisone) .... Take 6 Tabs Prn 12)  Sudafed 30 Mg  Tabs (Pseudoephedrine Hcl) .... 2 By Mouth As Needed Allergy  Allergies (verified): No Known Drug Allergies  Past History:  Past Medical History: Last updated: 04/04/2007 Coronary artery disease Diabetes mellitus, type II Hypertension Hypothyroidism Allergy to bee stings Macular degeneration  Social History: Last updated: 06/05/2009 Occupation: [employer] Married Current Smoker 2 ppd  Past Surgical History: Denies surgical history  Review of Systems   The patient complains of dyspnea on exertion.  The patient denies fever, weight loss, weight gain, chest pain, syncope, prolonged cough, and abdominal pain.         Tired  Physical Exam  General:  overweight-appearing.   Nose:  External nasal examination shows no deformity or inflammation. Nasal mucosa are pink and moist without lesions or exudates. Mouth:  Oral mucosa and oropharynx without lesions or exudates.  Teeth in good repair. Lungs:  Normal respiratory effort, chest expands symmetrically. Lungs are clear to auscultation, no crackles or wheezes. Heart:  Normal rate and regular rhythm. S1 and S2 normal without gallop, murmur, click, rub or other extra sounds. Abdomen:  Bowel sounds positive,abdomen soft and non-tender without masses, organomegaly or hernias noted. Msk:  No deformity or scoliosis noted of thoracic or lumbar spine.   Extremities:  No clubbing, cyanosis, edema, or deformity noted with normal full range of motion of all joints.   Neurologic:  No cranial nerve deficits noted. Station and gait are normal. Plantar reflexes are down-going bilaterally. DTRs are symmetrical throughout. Sensory, motor and coordinative functions appear intact. Skin:  Intact without suspicious lesions or rashes Psych:  Cognition and judgment appear intact. Alert and cooperative with normal attention span and concentration. No apparent delusions, illusions, hallucinations  Impression & Recommendations:  Problem # 1:  HYPOTHYROIDISM (ICD-244.9) Assessment Deteriorated  The following medications were removed from the medication list:    Levoxyl 150 Mcg Tabs (Levothyroxine sodium) .Marland Kitchen... 1 once daily His updated medication list for this problem includes:    Synthroid 200 Mcg Tabs (Levothyroxine sodium) .Marland Kitchen... 1 by mouth once daily as dirr ac for thyroid  Problem # 2:  DIABETES MELLITUS, TYPE II (ICD-250.00) Assessment: Deteriorated  His updated medication list for this problem includes:     Lisinopril 40 Mg Tabs (Lisinopril) .Marland Kitchen... 1 once daily    Glucophage Xr 500 Mg Tb24 (Metformin hcl) .Marland Kitchen... 2 tabs by mouth bid    Glipizide 10 Mg Tabs (Glipizide) .Marland Kitchen... 1 by mouth bid    Actos 45 Mg Tabs (Pioglitazone hcl) .Marland Kitchen... 1 by mouth qd    Aspir-low 81 Mg Tbec (Aspirin) .Marland Kitchen... 1 once daily after meal  Problem # 3:  OBESITY (ICD-278.00) Assessment: Unchanged  Problem # 4:  CORONARY ARTERY DISEASE (ICD-414.00) Assessment: Unchanged  His updated medication list for this problem includes:    Lisinopril 40 Mg Tabs (Lisinopril) .Marland Kitchen... 1 once daily    Aspir-low 81 Mg Tbec (Aspirin) .Marland Kitchen... 1 once daily after meal    Tenoretic 100 100-25 Mg Tabs (Atenolol-chlorthalidone) .Marland Kitchen... 1 by mouth q am  Problem # 5:  TOBACCO USER (ICD-305.1) Assessment: Unchanged  Encouraged smoking cessation and discussed different methods for smoking cessation.   Complete Medication List: 1)  Lisinopril 40 Mg Tabs (Lisinopril) .Marland Kitchen.. 1 once daily 2)  Simvastatin 40 Mg Tabs (Simvastatin) .... One once daily 3)  Glucophage Xr 500 Mg Tb24 (Metformin hcl) .... 2 tabs by mouth bid 4)  Glipizide 10 Mg Tabs (Glipizide) .Marland Kitchen.. 1 by mouth bid 5)  Actos 45 Mg Tabs (Pioglitazone hcl) .Marland Kitchen.. 1 by mouth qd 6)  Epipen 2-pak 0.3 Mg/0.88ml (1:1000) Devi (Epinephrine hcl (anaphylaxis)) .... As dirr. as needed bee sting 7)  Vitamin D3 1000 Unit Tabs (Cholecalciferol) .Marland Kitchen.. 1 qd 8)  Aspir-low 81 Mg Tbec (Aspirin) .Marland Kitchen.. 1 once daily after meal 9)  Tenoretic 100 100-25 Mg Tabs (Atenolol-chlorthalidone) .Marland Kitchen.. 1 by mouth q am 10)  Prednisone 10 Mg Tabs (Prednisone) .... Take 6 tabs prn 11)  Sudafed 30 Mg Tabs (Pseudoephedrine hcl) .... 2 by mouth as needed allergy 12)  Synthroid 200 Mcg Tabs (Levothyroxine sodium) .Marland Kitchen.. 1 by mouth once daily as dirr ac for thyroid  Patient Instructions: 1)  TSH in 6 wks 2)  Please schedule a follow-up appointment in 3 months. 3)  BMP prior to visit, ICD-9: 4)  HbgA1C prior to visit, ICD-9:250.02 5)  TSH  prior to visit, ICD-9: Prescriptions: SYNTHROID 200 MCG TABS (LEVOTHYROXINE SODIUM) 1 by mouth once daily as dirr ac for thyroid Brand medically necessary #30 x 12   Entered and Authorized by:   Tresa Garter MD   Signed by:   Tresa Garter MD on 09/05/2009   Method used:   Electronically to        [facility]* (retail)       [address]       [address]       [place], [place]  [address]       Ph: [phone]       Fax: [phone]   RxID:   [id number]

## 2010-04-23 NOTE — Progress Notes (Signed)
Summary: metformin & glipizide  Phone Note Refill Request Message from:  Fax from Pharmacy on Jul 23, 2009 2:36 PM  Refills Requested: Medication #1:  GLIPIZIDE 10 MG  TABS 1 by mouth bid  Medication #2:  GLUCOPHAGE XR 500 MG  TB24 2 tabs by mouth bid [facility]   Method Requested: Electronic Initial call taken by: Orlan Leavens,  Jul 23, 2009 2:36 PM    Prescriptions: GLUCOPHAGE XR 500 MG  TB24 (METFORMIN HCL) 2 tabs by mouth bid  #120 x 12   Entered by:   Orlan Leavens   Authorized by:   Tresa Garter MD   Signed by:   Orlan Leavens on 07/23/2009   Method used:   Electronically to        [facility]* (retail)       [address]       [address]       [place], [place]  [address]       Ph: [phone]       Fax: [phone]   RxID:   [id number] GLIPIZIDE 10 MG  TABS (GLIPIZIDE) 1 by mouth bid  #60 x 12   Entered by:   Orlan Leavens   Authorized by:   Tresa Garter MD   Signed by:   Orlan Leavens on 07/23/2009   Method used:   Electronically to        [facility]* (retail)       [address]       [address]       [place], [place]  [address]       Ph: [phone]       Fax: [phone]   RxID:   [id number]

## 2010-04-23 NOTE — Miscellaneous (Signed)
Summary: [facility]   Imported By: Lester [place] 06/11/2009 07:26:50  _____________________________________________________________________  External Attachment:    Type:   Image     Comment:   External Document

## 2010-04-23 NOTE — Assessment & Plan Note (Signed)
Summary: NEW ENDO UHC-DM II-PER STACY/DR AVP--#-INSULIN USAGE-STC   Vital Signs:  Patient profile:   55 year old male Height:      65 inches (165.10 cm) Weight:      244 pounds (110.91 kg) BMI:     40.75 O2 Sat:      93 % on Room air Temp:     98.4 degrees F (36.89 degrees C) oral Pulse rate:   60 / minute Pulse rhythm:   regular BP sitting:   130 / 70  (left arm) Cuff size:   large  Vitals Entered By: Brenton Grills CMA Duncan Dull) (April 08, 2010 9:48 AM)  O2 Flow:  Room air CC: New Endo Consult/DMII/Dr. Plotnikov/pt is no longer taking Actos (edema)/aj Is Patient Diabetic? Yes   CC:  New Endo Consult/DMII/Dr. Plotnikov/pt is no longer taking Actos (edema)/aj.  History of Present Illness: pt states 14 years h/o dm.  it is complicated by cad.  he has never been on insulin.  he takes 4 oral agents.  no cbg record, but states cbg's are improved to the low-100's.   pt says his diet and exercise are "poor."   symptomatically, pt states 6 mos of moderate swelling of the legs, and assoc pain.  he stopped the actos, with some improvement in the swelling.     Current Medications (verified): 1)  Lisinopril 40 Mg Tabs (Lisinopril) .Marland Kitchen.. 1 Once Daily 2)  Simvastatin 40 Mg Tabs (Simvastatin) .... One Once Daily 3)  Glipizide 10 Mg  Tabs (Glipizide) .Marland Kitchen.. 1 By Mouth Bid 4)  Actos 45 Mg Tabs (Pioglitazone Hcl) .Marland Kitchen.. 1 By Mouth Qd 5)  Epipen 2-Pak 0.3 Mg/0.24ml (1:1000)  Devi (Epinephrine Hcl (Anaphylaxis)) .... As Dirr. As Needed Bee Sting 6)  Vitamin D3 1000 Unit  Tabs (Cholecalciferol) .Marland Kitchen.. 1 Qd 7)  Aspir-Low 81 Mg Tbec (Aspirin) .Marland Kitchen.. 1 Once Daily After Meal 8)  Tenoretic 100 100-25 Mg  Tabs (Atenolol-Chlorthalidone) .Marland Kitchen.. 1 By Mouth Q Am 9)  Prednisone 10 Mg  Tabs (Prednisone) .... Take 6 Tabs Prn 10)  Sudafed 30 Mg  Tabs (Pseudoephedrine Hcl) .... 2 By Mouth As Needed Allergy 11)  Synthroid 175 Mcg Tabs (Levothyroxine Sodium) .Marland Kitchen.. 1 By Mouth Qd 12)  Chantix Starting Month Pak 0.5 Mg X 11 &  1 Mg X 42 Tabs (Varenicline Tartrate) .... As Dirrected 13)  Chantix Continuing Month Pak 1 Mg Tabs (Varenicline Tartrate) .... As Dirrected 14)  Kombiglyze Xr 2.07-998 Mg Xr24h-Tab (Saxagliptin-Metformin) .Marland Kitchen.. 1 By Mouth Bid  Allergies (verified): No Known Drug Allergies  Past History:  Past Medical History: Last updated: 04/04/2007 Coronary artery disease Diabetes mellitus, type II Hypertension Hypothyroidism Allergy to bee stings Macular degeneration  Family History: Reviewed history from 04/04/2007 and no changes required. Family History Hypertension dm: both parents, 1 sib  Social History: Reviewed history from 06/05/2009 and no changes required. Occupation: [employer] Married Current Smoker 2 ppd  Review of Systems       denies blurry vision, chest pain, sob, n/v, cramps, memory loss, depression, hypoglycemia, rhinorrhea, and easy bruising.  he has lost 20 lbs, x 1 year.   he has intermittent headaches, excessive diaphoresis, and nocturia.   Physical Exam  General:  obese.  no distress  Head:  head: no deformity eyes: no periorbital swelling, no proptosis external nose and ears are normal mouth: no lesion seen Neck:  Supple without thyroid enlargement or tenderness.  Lungs:  Clear to auscultation bilaterally. Normal respiratory effort.  Heart:  Regular rate and rhythm without murmurs or gallops noted. Normal S1,S2.   Abdomen:  abdomen is soft, nontender.  no hepatosplenomegaly.   not distended.  no hernia  Msk:  muscle bulk and strength are grossly normal.  no obvious joint swelling.  gait is normal and steady  Pulses:  dorsalis pedis intact bilat.  no carotid bruit  Extremities:  no deformity.  no ulcer on the feet.  feet are of normal color and temp.   trace right pedal edema and trace left pedal edema.   Neurologic:  cn 2-12 grossly intact.   readily moves all 4's.   sensation is intact to touch on the feet Skin:  normal texture and temp.  no rash.   not diaphoretic  Cervical Nodes:  No significant adenopathy.  Psych:  Alert and cooperative; normal mood and affect; normal attention span and concentration.     Impression & Recommendations:  Problem # 1:  DIABETES MELLITUS, TYPE II (ICD-250.00) he needs insulin  Problem # 2:  EDEMA (ICD-782.3) due to actos.  improved off it.  Problem # 3:  CORONARY ARTERY DISEASE (ICD-414.00) he should have aggressive risk-factor control  Medications Added to Medication List This Visit: 1)  Onetouch Ultra Blue Strp (Glucose blood) .... Two times a day, and lancets 250.01  Other Orders: T-Fructosamine (54098-11914) Diabetic Clinic Referral (Diabetic) Consultation Level IV (78295)  Patient Instructions: 1)  good diet and exercise habits significanly improve the control of your diabetes.  please let me know if you wish to be referred to a dietician.  high blood sugar is very risky to your health.  you should see an eye doctor every year. 2)  controlling your blood pressure and cholesterol drastically reduces the damage diabetes does to your body.  this also applies to quitting smoking.  please discuss these with your doctor.  you should take an aspirin every day, unless you have been advised by a doctor not to. 3)  check your blood sugar 2 times a day.  vary the time of day when you check, between before the 3 meals, and at bedtime.  also check if you have symptoms of your blood sugar being too high or too low.  please keep a record of the readings and bring it to your next appointment here.  please call us sooner if you are having low blood sugar episodes. 4)  blood tests are being ordered for you today.  please call [phone] to hear your test results.  i believe it will say that you need to take insulin to control your blood sugar. 5)  refer to a diabetes educator, to learn insulin injections.  you will be called with a day and time for an appointment.  please consider how many injections per day you  wish to take, and let me know.  3 per day matches the insulin to your requirements better than 1 or 2 per day, but i will go with your decision.   6)  Please schedule a follow-up appointment in 2-3 weeks. Prescriptions: ONETOUCH ULTRA BLUE  STRP (GLUCOSE BLOOD) two times a day, and lancets 250.01  #100 x 5   Entered and Authorized by:   Minus Breeding MD   Signed by:   Minus Breeding MD on 04/08/2010   Method used:   Electronically to        [facility]* (retail)       [address]  [address]       [place], [place]  [address]       Ph: [phone]       Fax: [phone]   RxID:   [id number]    Orders Added: 1)  T-Fructosamine [phone] 2)  Diabetic Clinic Referral [Diabetic] 3)  Consultation Level IV [27253]

## 2010-04-23 NOTE — Assessment & Plan Note (Signed)
Summary: 3 mos f/u #/cd   Vital Signs:  Patient profile:   55 year old male Height:      65 inches Weight:      240 pounds BMI:     40.08 Temp:     98.2 degrees F oral Pulse rate:   72 / minute Pulse rhythm:   regular Resp:     16 per minute BP sitting:   120 / 80  (left arm) Cuff size:   large  Vitals Entered By: Lanier Prude, CMA(AAMA) (March 09, 2010 9:30 AM) CC: 3 mo f/u  Is Patient Diabetic? Yes Comments pt is not taking Actos or Chantix.   CC:  3 mo f/u .  History of Present Illness: The patient presents for a follow up of hypertension, diabetes, hyperlipidemia  C/o B neck and shoulders pain x 1 month after he was lifting something very heavy at work - a 250 lbs rock  Current Medications (verified): 1)  Lisinopril 40 Mg Tabs (Lisinopril) .Marland Kitchen.. 1 Once Daily 2)  Simvastatin 40 Mg Tabs (Simvastatin) .... One Once Daily 3)  Glucophage Xr 500 Mg  Tb24 (Metformin Hcl) .... 2 Tabs By Mouth Bid 4)  Glipizide 10 Mg  Tabs (Glipizide) .Marland Kitchen.. 1 By Mouth Bid 5)  Actos 45 Mg Tabs (Pioglitazone Hcl) .Marland Kitchen.. 1 By Mouth Qd 6)  Epipen 2-Pak 0.3 Mg/0.50ml (1:1000)  Devi (Epinephrine Hcl (Anaphylaxis)) .... As Dirr. As Needed Bee Sting 7)  Vitamin D3 1000 Unit  Tabs (Cholecalciferol) .Marland Kitchen.. 1 Qd 8)  Aspir-Low 81 Mg Tbec (Aspirin) .Marland Kitchen.. 1 Once Daily After Meal 9)  Tenoretic 100 100-25 Mg  Tabs (Atenolol-Chlorthalidone) .Marland Kitchen.. 1 By Mouth Q Am 10)  Prednisone 10 Mg  Tabs (Prednisone) .... Take 6 Tabs Prn 11)  Sudafed 30 Mg  Tabs (Pseudoephedrine Hcl) .... 2 By Mouth As Needed Allergy 12)  Synthroid 175 Mcg Tabs (Levothyroxine Sodium) .Marland Kitchen.. 1 By Mouth Qd 13)  Chantix Starting Month Pak 0.5 Mg X 11 & 1 Mg X 42 Tabs (Varenicline Tartrate) .... As Dirrected 14)  Chantix Continuing Month Pak 1 Mg Tabs (Varenicline Tartrate) .... As Dirrected  Allergies (verified): No Known Drug Allergies  Past History:  Past Medical History: Last updated: 04/04/2007 Coronary artery disease Diabetes mellitus,  type II Hypertension Hypothyroidism Allergy to bee stings Macular degeneration  Past Surgical History: Last updated: 09/05/2009 Denies surgical history  Family History: Last updated: 04/04/2007 Family History Hypertension  Social History: Last updated: 06/05/2009 Occupation: [employer] Married Current Smoker 2 ppd   Impression & Recommendations:  Problem # 1:  SHOULDER PAIN (ICD-719.41) B MSK Assessment New  His updated medication list for this problem includes:    Aspir-low 81 Mg Tbec (Aspirin) .Marland Kitchen... 1 once daily after meal  Orders: T-Cervicle Spine 2-3 Views ([address]UE)  Problem # 2:  NECK PAIN (ICD-723.1) B MSK Assessment: New  His updated medication list for this problem includes:    Aspir-low 81 Mg Tbec (Aspirin) .Marland Kitchen... 1 once daily after meal  Orders: T-Cervicle Spine 2-3 Views (45409WJ)  Problem # 3:  DIABETES MELLITUS, TYPE II (ICD-250.00) Assessment: Deteriorated He needs to go on Insulin. He needs Endocr consult The following medications were removed from the medication list:    Glucophage Xr 500 Mg Tb24 (Metformin hcl) .Marland Kitchen... 2 tabs by mouth bid His updated medication list for this problem includes:    Lisinopril 40 Mg Tabs (Lisinopril) .Marland Kitchen... 1 once daily    Glipizide 10 Mg Tabs (Glipizide) .Marland Kitchen... 1 by mouth bid  Actos 45 Mg Tabs (Pioglitazone hcl) .Marland Kitchen... 1 by mouth qd    Aspir-low 81 Mg Tbec (Aspirin) .Marland Kitchen... 1 once daily after meal    Kombiglyze Xr 2.07-998 Mg Xr24h-tab (Saxagliptin-metformin) .Marland Kitchen... 1 by mouth bid  Orders: TLB-BMP (Basic Metabolic Panel-BMET) (80048-METABOL) TLB-TSH (Thyroid Stimulating Hormone) (84443-TSH) TLB-A1C / Hgb A1C (Glycohemoglobin) (83036-A1C)  Problem # 4:  HYPERTENSION (ICD-401.9) Assessment: Unchanged  His updated medication list for this problem includes:    Lisinopril 40 Mg Tabs (Lisinopril) .Marland Kitchen... 1 once daily    Tenoretic 100 100-25 Mg Tabs (Atenolol-chlorthalidone) .Marland Kitchen... 1 by mouth q am  BP today: 120/80 Prior BP:  122/68 (12/05/2009)  Labs Reviewed: K+: 4.3 (12/04/2009) Creat: : 1.0 (12/04/2009)   Chol: 163 (06/05/2009)   HDL: 42.40 (06/05/2009)   LDL: 90 (06/05/2009)   TG: 151.0 (06/05/2009)  Complete Medication List: 1)  Lisinopril 40 Mg Tabs (Lisinopril) .Marland Kitchen.. 1 once daily 2)  Simvastatin 40 Mg Tabs (Simvastatin) .... One once daily 3)  Glipizide 10 Mg Tabs (Glipizide) .Marland Kitchen.. 1 by mouth bid 4)  Actos 45 Mg Tabs (Pioglitazone hcl) .Marland Kitchen.. 1 by mouth qd 5)  Epipen 2-pak 0.3 Mg/0.58ml (1:1000) Devi (Epinephrine hcl (anaphylaxis)) .... As dirr. as needed bee sting 6)  Vitamin D3 1000 Unit Tabs (Cholecalciferol) .Marland Kitchen.. 1 qd 7)  Aspir-low 81 Mg Tbec (Aspirin) .Marland Kitchen.. 1 once daily after meal 8)  Tenoretic 100 100-25 Mg Tabs (Atenolol-chlorthalidone) .Marland Kitchen.. 1 by mouth q am 9)  Prednisone 10 Mg Tabs (Prednisone) .... Take 6 tabs prn 10)  Sudafed 30 Mg Tabs (Pseudoephedrine hcl) .... 2 by mouth as needed allergy 11)  Synthroid 175 Mcg Tabs (Levothyroxine sodium) .Marland Kitchen.. 1 by mouth qd 12)  Chantix Starting Month Pak 0.5 Mg X 11 & 1 Mg X 42 Tabs (Varenicline tartrate) .... As dirrected 13)  Chantix Continuing Month Pak 1 Mg Tabs (Varenicline tartrate) .... As dirrected 14)  Kombiglyze Xr 2.07-998 Mg Xr24h-tab (Saxagliptin-metformin) .Marland Kitchen.. 1 by mouth bid  Patient Instructions: 1)  Contour pilow 2)  Use stretching  exercises for neck  that I have provided  3)  Please schedule a follow-up appointment in 3 months. 4)  BMP prior to visit, ICD-9: 5)  Hepatic Panel prior to visit, ICD-9: 6)  HbgA1C prior to visit, ICD-9:250.02 Prescriptions: KOMBIGLYZE XR 2.07-998 MG XR24H-TAB (SAXAGLIPTIN-METFORMIN) 1 by mouth bid  #60 x 11   Entered and Authorized by:   Tresa Garter MD   Signed by:   Tresa Garter MD on 03/10/2010   Method used:   Electronically to        [id number]* (retail)       [address]       [place], [place]  [address]       Ph: [phone] or [phone]        Fax: [phone]   RxID:   [id number]    Orders Added: 1)  Est. Patient Level IV [27253] 2)  TLB-BMP (Basic Metabolic Panel-BMET) [80048-METABOL] 3)  TLB-TSH (Thyroid Stimulating Hormone) [84443-TSH] 4)  TLB-A1C / Hgb A1C (Glycohemoglobin) [83036-A1C] 5)  T-Cervicle Spine 2-3 Views [72040TC]   Immunization History:  Tetanus/Td Immunization History:    Tetanus/Td:  historical (12/06/2007)   Immunization History:  Tetanus/Td Immunization History:    Tetanus/Td:  Historical (12/06/2007)

## 2010-06-03 ENCOUNTER — Other Ambulatory Visit: Payer: Self-pay

## 2010-06-05 ENCOUNTER — Ambulatory Visit: Payer: Self-pay | Admitting: Internal Medicine

## 2010-06-09 ENCOUNTER — Other Ambulatory Visit: Payer: Self-pay | Admitting: *Deleted

## 2010-06-09 MED ORDER — PREDNISONE 10 MG PO TABS: 60.0000 mg | ORAL_TABLET | ORAL | Status: DC | PRN

## 2010-06-09 MED ORDER — EPINEPHRINE 0.3 MG/0.3ML IJ DEVI: INTRAMUSCULAR | Status: DC

## 2010-07-07 ENCOUNTER — Other Ambulatory Visit (INDEPENDENT_AMBULATORY_CARE_PROVIDER_SITE_OTHER): Payer: 59

## 2010-07-07 DIAGNOSIS — IMO0001 Reserved for inherently not codable concepts without codable children: Secondary | ICD-10-CM

## 2010-07-07 LAB — BASIC METABOLIC PANEL
CO2: 30 mEq/L (ref 19–32)
Calcium: 9.9 mg/dL (ref 8.4–10.5)
Chloride: 101 mEq/L (ref 96–112)
Sodium: 139 mEq/L (ref 135–145)

## 2010-07-07 LAB — HEPATIC FUNCTION PANEL
ALT: 21 U/L (ref 0–53)
Bilirubin, Direct: 0.1 mg/dL (ref 0.0–0.3)
Total Bilirubin: 0.3 mg/dL (ref 0.3–1.2)

## 2010-07-07 LAB — HEMOGLOBIN A1C: Hgb A1c MFr Bld: 8.8 % — ABNORMAL HIGH (ref 4.6–6.5)

## 2010-07-08 ENCOUNTER — Ambulatory Visit (INDEPENDENT_AMBULATORY_CARE_PROVIDER_SITE_OTHER): Payer: 59 | Admitting: Internal Medicine

## 2010-07-08 ENCOUNTER — Encounter: Payer: Self-pay | Admitting: Internal Medicine

## 2010-07-08 DIAGNOSIS — E039 Hypothyroidism, unspecified: Secondary | ICD-10-CM

## 2010-07-08 DIAGNOSIS — E119 Type 2 diabetes mellitus without complications: Secondary | ICD-10-CM

## 2010-07-08 DIAGNOSIS — I2510 Atherosclerotic heart disease of native coronary artery without angina pectoris: Secondary | ICD-10-CM

## 2010-07-08 DIAGNOSIS — E1165 Type 2 diabetes mellitus with hyperglycemia: Secondary | ICD-10-CM

## 2010-07-08 DIAGNOSIS — IMO0001 Reserved for inherently not codable concepts without codable children: Secondary | ICD-10-CM

## 2010-07-08 DIAGNOSIS — E669 Obesity, unspecified: Secondary | ICD-10-CM

## 2010-07-08 NOTE — Progress Notes (Signed)
  Subjective:    Patient ID: Anthony Woods, male    DOB: Jan 29, 1956, 55 y.o.   MRN: [id number]  HPI   BP Readings from Last 3 Encounters:  07/08/10 108/70  04/08/10 130/70  03/09/10 120/80    Review of Systems     Wt Readings from Last 3 Encounters:  07/08/10 240 lb (108.863 kg)  04/08/10 244 lb (110.678 kg)  03/09/10 240 lb (108.863 kg)    Objective:   Physical Exam       Lab Results  Component Value Date   WBC 9.3 11/16/2006   HGB 14.3 11/16/2006   HCT 42.2 11/16/2006   PLT 173 11/16/2006   CHOL 163 06/05/2009   TRIG 151.0* 06/05/2009   HDL 42.40 06/05/2009   LDLDIRECT 176.5 09/27/2008   ALT 21 07/07/2010   AST 15 07/07/2010   NA 139 07/07/2010   K 4.1 07/07/2010   CL 101 07/07/2010   CREATININE 1.0 07/07/2010   BUN 12 07/07/2010   CO2 30 07/07/2010   TSH 0.22* 03/09/2010   HGBA1C 8.8* 07/07/2010    Assessment & Plan:  DIABETES MELLITUS, TYPE II Much better but far from goal. Victoza info given. He does not wish to go on Insulin.  CORONARY ARTERY DISEASE No CP - he needs to stop smoking.  HYPOTHYROIDISM Cont Rx  OBESITY He is to cont. w/wt loss

## 2010-07-08 NOTE — Assessment & Plan Note (Addendum)
Much better but far from goal. Victoza info given. He does not wish to go on Insulin.

## 2010-07-12 NOTE — Assessment & Plan Note (Signed)
He is to cont. w/wt loss

## 2010-07-12 NOTE — Assessment & Plan Note (Signed)
No CP - he needs to stop smoking.

## 2010-07-12 NOTE — Assessment & Plan Note (Signed)
Cont Rx 

## 2010-08-07 NOTE — H&P (Signed)
[place]. [facility]  Patient:    Anthony Woods, Anthony Woods Visit Number: [id number] MRN: [id number]          Service Type: EMS Location: [facility] Attending Physician:  Hanley Seamen Dictated by:   Brita Romp, P.A. Admit Date:  10/09/2001   CC:         Dr. Oneta Rack   History and Physical  CHIEF COMPLAINT:  The patient complains of chest pain which is substernal in nature and radiates to both sides.  There is also radiation down the left arm. He describes the pain as heavy and tight, is accompanied by shortness of breath, no nausea or diaphoresis.  HISTORY OF PRESENT ILLNESS:  Anthony Woods is a 55 year old male with no prior coronary artery disease history.  He reports an approximately six-month history of intermittent chest pain as previously described.  The pain occurs while at rest (however, he describes this pain as sharp) as well as with heavy exertion.  (This is the heavy type pain.)  He states that the heavy pain decreased with rest after lasting approximately 3 to 5 minutes.  There is no change with food or position.  On the day of admission, the patient had a regularly scheduled appointment with Dr. Oneta Rack for blood pressure monitoring.  In discussing the chest pain with Dr. Oneta Rack, the patient was referred to the [facility] Emergency Room for further evaluation.  PAST MEDICAL HISTORY:  Significant for hyperlipidemia, hypertension, hypothyroidism, and non-insulin-dependent diabetes mellitus (diet controlled).  PAST SURGICAL HISTORY:  Significant for removal of multiple superficial cysts.  CURRENT MEDICATIONS: 1. Lipitor 40 mg q.o.d. 2. Atenolol 50 mg q.d. 3. Synthroid 150 mcg q.d.  ALLERGIES:  The patient reports no drug allergies and is allergic to BEE STINGS.  He denies any allergies to contrast dye or shellfish.  SOCIAL HISTORY:  The patient lives in [place] with his wife.  He is employed as a Nature conservation officer.  He has a 40+ pack year  history of smoking; currently he is at two packs per day.  He denies use of alcohol or illicit drugs.  He has no regular exercise or diet program.  FAMILY HISTORY:   His mother is alive with congestive heart failure; however, no history of CAD.  Father is deceased with CVA.  He has no siblings.  CARDIAC RISK FACTORS:  Include hypertension, hyperlipidemia, diabetes mellitus, and tobacco use.  REVIEW OF SYSTEMS:  The patient reports a febrile episode the previous evening.  He also reports more frequent headaches and some vertigo-like symptoms.  As described in the HPI, the patient reports chest pain and shortness of breath.  He reports some swelling of the right knee as well as some right lower quadrant abdominal pain.  PHYSICAL EXAMINATION:  VITAL SIGNS:  Temperature 98.0, pulse 64, respiratory rate 22, blood pressure 143/83.  GENERAL:  This is a 55 year old Caucasian male in no acute distress.  HEENT:  Normocephalic, atraumatic.  Pupils are equal, round and reactive to light and accommodation.  Extraocular movements are intact.  NECK:  Supple without lymphadenopathy, thyromegaly, bruit, or jugular venous distension.  HEART:  Regular rate and rhythm, normal S1 and S2.  No S3 or S4.  No murmurs, rubs, or gallops are appreciated.  Pulses are 2+ and equal without bruits.  LUNGS:  Clear to auscultation bilaterally.  ABDOMEN:  Soft and nontender with normal bowel sounds.  There is no rebound or guarding.   There is no hepatosplenomegaly.  EXTREMITIES:  There  is no clubbing, cyanosis, or edema.  NEUROLOGIC:  The patient is awake, alert, and oriented x 3.  Cranial nerves II-XII grossly intact.  There are no focal neurological findings.  DIAGNOSTIC DATA:  Chest x-ray shows some cardiomegaly with a question of a right basilar atelectasis versus pneumonia.  Electrocardiogram shows sinus rhythm at 60, axis 33, PR interval 125, QRS 96, QTC 413.  There are some T wave inversions noted  in leads III, aVF, V1, V3.  White count 6.4, hemoglobin 13.9, hematocrit 41.4, platelet 161.  Sodium 135, potassium 3.8, chloride 106, CO2 23, BUN 12, creatinine 0.9, glucose 247. Total bilirubin 0.4, AST 22, ALT 32, alkaline phosphatase 87.  Total CK 240, MB 3.2, troponin I less than 0.01.  PTT 31, PT 13.0, INR 0.9.  ASSESSMENT/PLAN: 1. Chest pain.  This is worrisome for unstable angina.  Will start the patient    on aspirin, Lovenox, and continue beta blocker.  We will defer nitrates and    and IIb/IIIa inhibitors at this time.  Will also defer Plavix at this time.    Will plan for the patient to undergo cardiac catheterization on Tuesday,    July 22. 2. Diabetes mellitus.  Will begin diabetic diet and check hemoglobin A1C. 3. Hypertension.  Continue atenolol and see if additional agents need to    be added. 4. Hyperlipidemia.  Will continue his Lipitor and check a fasting lipid    panel the following morning. Dictated by:   Brita Romp, P.A. Attending Physician:  Hanley Seamen DD:  10/09/01 TD:  10/09/01 Job: [id number]

## 2010-08-07 NOTE — Discharge Summary (Signed)
[place]. [facility]  Patient:    Anthony Woods, Anthony Woods Visit Number: [id number] MRN: [id number]          Service Type: MED Location: [id number] Attending Physician:  Talitha Givens Dictated by:   Brita Romp, P.A.-C Admit Date:  10/09/2001 Discharge Date: 10/10/2001   CC:         Marinus Maw, M.D.   Discharge Summary  DISCHARGE DIAGNOSES: 1. Chest pain, status post cardiac catheterization. 2. Hyperlipidemia. 3. Hypertension. 4. Hypothyroidism. 5. Type 2 diabetes mellitus.  HOSPITAL COURSE:  The patient is a 55 year old male who presented to the [facility] Emergency Room on 10/09/01, complaining of substernal chest pain. He was seen and admitted by Dr. Willa Rough.  Given the patients history, Dr. Myrtis Ser was concerned that the pain may represent unstable angina.  He started the patient on aspirin and Lovenox, and continued the patients beta blocker.  He also arranged for the patient to undergo cardiac catheterization the following day.  On 10/10/01, the patient was taken to the catheterization laboratory by Dr. Daisey Must.  CATHETERIZATION RESULTS: 1. Left main coronary artery:  Less than 20% stenosis. 2. Left anterior descending artery:  Less than 20% stenosis in the mid vessel. 3. Left circumflex:  Less than 20% lesion in the first obtuse marginal branch. 4. Right coronary artery:  Less than 20% stenosis in the mid vessel. 5. Left ventriculogram:  No wall motion abnormalities, ejection fraction of    63%, no mitral regurgitation.  Dr. Gerri Spore felt that there was no significant coronary artery disease to explain the patients pain.  He felt that after the standard wait, the patient was ready for discharge.  DISCHARGE MEDICATIONS: 1. Lipitor 40 mg q.o.d. 2. Atenolol 50 mg q.d. 3. Synthroid 150 mcg q.d.  LABORATORY DATA:  White blood cell count 7.4, hemoglobin 13.9, hematocrit 40.7, platelets 171.  Preoperative  creatinine 0.9, glucose 247.  Alkaline phosphatase 87, SGOT 22, SGPT 32, total protein 6.4, albumin 4.0, calcium 9.4. Fasting lipid panel is pending at time of discharge.  TSH is 0.783.  Of note, hemoglobin A1C was elevated at 7.9.  Chest x-ray shows mild cardiomegaly with a question of right basilar atelectasis.  Electrocardiogram shows sinus rhythm at 60.  Axis 33, PR interval 125, QRS 96, QTC 413.  There are some T-wave inversions noted in leads 3, aVF, V1, and V3.  ACTIVITY:  The patient is to avoid driving, heavy lifting, tub baths, or sexual activity for two days.  DIET:  He is to follow a low fat, low cholesterol diabetic diet.  WOUND CARE:  He is to watch the catheter site for any pain, bleeding, or swelling, and to call the [place] office with any of these problems.  FOLLOWUP:  He is to contact Dr. Michaelle Birks office for a follow-up appointment. Dictated by:   Brita Romp, P.A.-C Attending Physician:  Talitha Givens DD:  10/10/01 TD:  10/16/01 Job: [id number]

## 2010-08-07 NOTE — Letter (Signed)
July 07, 2006    [facility]  [address], [place]  [address]   RE:  Anthony, Woods  MRN:  [id number]  /  DOB:  [birth year]   Dear Anthony Woods or Madam:   [name] was seen by myself on June 21, 2006 in regards to his  abnormal EKG.  His EKG, in fact, is unchanged from April 16, 2003.  He  does not have any active complaints or symptoms.  In my opinion, he is  clear for his job duties.   For complete assurance, we are getting a stress test.    Sincerely,      Georgina Quint. Plotnikov, MD  Electronically Signed    AVP/MedQ  DD: 07/07/2006  DT: 07/07/2006  Job #: [id number]   CC:   Brett Canales A. Cleta Alberts, M.D.  [name]

## 2010-08-07 NOTE — H&P (Signed)
NAME:  Anthony Woods, Anthony Woods                          ACCOUNT NO.:  [id number]   MEDICAL RECORD NO.:  [id number]                   PATIENT TYPE:  INP   LOCATION:  2901                                 FACILITY:  [facility]   PHYSICIAN:  [name], M.D.                 DATE OF BIRTH:  1956/03/07   DATE OF ADMISSION:  11/07/2002  DATE OF DISCHARGE:                                HISTORY & PHYSICAL   PRIMARY CARE PHYSICIAN:  Lucky Cowboy, M.D.   CHIEF COMPLAINT:  Lip swelling, tongue swelling, chest pain, shortness of  breath (anaphylactic shock secondary to bee sting).   HISTORY OF PRESENT ILLNESS:  Anthony Woods is [name] with [name] significant for allergy to BEE STINGS, type 2 diabetes  mellitus, and hypertension who presented to the emergency department this  morning after an acute onset of shortness of breath, lip swelling, tongue  swelling, and chest pain.  The patient states that he was stung by [name] at  approximately 9:15 [name]  He was at  work at the time of the bee sting. His symptoms began with an itching  sensation all over, then it progressed to [name].  It progressed to his lips becoming swollen and his tongue  becoming swollen.  He began having nausea with 1 episode of vomiting,  trouble breathing, and swallowing as he was arriving to the emergency  department.  He began to have some chest pain with intermittent wheezing.  When he arrived to the emergency department his blood pressure was found to  be 84/58 with [name] of 24 and [name] of 53.  He was  oxygenating 92% on room air.   He was treated by the emergency department physician with nasal cannula  oxygen, Benadryl 50 mg IV x1, 0.3 of subcu epinephrine, Decadron 4 mg IV,  and Pepcid 20 mg IV.  The patient currently is stable with [name]  of 98/50.  He states that some of the swelling in  his lips and tongue have  resolved. Currently he has no difficulty breathing and no chest pain.   REVIEW OF SYSTEMS:  The patient's review of systems was otherwise negative  up until this morning.  The patient also had an EpiPen but thought that it  was outdated.  He did not have it on his person at the time.   PAST MEDICAL HISTORY:  1. Type 2 diabetes mellitus.  2. Hypertension.  3. Hyperlipidemia.  4. Hypothyroidism.  5. Status post left wrist surgery in the past.   ALLERGIES:  Severe allergy to BEE STINGS. No known drug allergies.   MEDICATIONS:  1. Lipitor 40 mg daily.  2. Metformin extended release 500 mg daily.  3. Diovan 160 mg daily.  4.  Atenolol 100 mg daily.  5. Levothroid 150 mcg daily.   SOCIAL HISTORY:  The patient is married and lives in [place] with his  wife.  He works for the [employer].  He smokes 2 packs of cigarettes  per day. He denies alcohol and illegal drug use.   FAMILY HISTORY:  The patient's mother is 40 and has Woods history of diabetes  mellitus and other chronic diseases.  His father died at the age of 78  secondary to stomach cancer.  He also had Woods history of stroke.   PHYSICAL EXAMINATION:  VITAL SIGNS:  Temperature 97.8, blood pressure 98/50,  pulse 54, respiratory rate 18, oxygen saturation on 2 liters 95%.  GENERAL:  The patient is an overweight 55 year old Caucasian male who is  currently sitting up on the table in no acute distress.  HEENT:  Head is normocephalic, atraumatic.  Pupils are equal, round, and  reacted to light.  Extraocular movements are intact.  Conjunctivae are just  mildly injected bilaterally.  Sclerae are white.  Tympanic membranes are  mildly obscured by cerumen bilaterally; however, they do appear clear.  Nasal mucosa is moist without any drainage.  Oropharynx reveals moist mucous  membranes and [name]; however, there is no posterior  pharyngeal edema or erythema.  No pharyngeal exudate.  Also  the patient had  no periorbital edema.  NECK:  Supple.  No adenopathy.  No thyromegaly.  LUNGS:  Clear to auscultation bilaterally.  HEART:  S1, S2, with no murmurs, rubs, or gallops.  BACK:  There is [name] on the left flank area with  Woods punctate lesion that is nondraining. It is mildly swollen and mildly  tender.  ABDOMEN:  Positive bowel sounds, obese, nontender, nondistended, no  hepatosplenomegaly.  GENITOURINARY: Deferred.  RECTAL: Deferred.  EXTREMITIES:  No pretibial edema.  Pedal pulses are 2+.  No joint  abnormalities.  SKIN: No obvious diffuse rash with the exception of what was noted on the  left flank.  NEUROLOGIC: The patient is alert and oriented x3.  Cranial nerves II-XII are  intact.  Strength is 5/5 throughout.  Sensation is intact throughout.   ADMISSION LABORATORIES:  Sodium 139, potassium 3.7, chloride 108, BUN 13,  glucose 156.  Hemoglobin 16, hematocrit 47.0%.  EKG reveals sinus  tachycardia and nonspecific ST-T wave abnormalities.   ASSESSMENT:  Anaphylactic shock secondary to bee sting.  The patient  received appropriate therapy in the emergency department.  [name] The chest pain and shortness of breath have  resolved.  His blood pressure has rebounded to 90-105 systolically while in  the emergency department.   PLAN:  1. The patient will be admitted to Woods step-down bed.  2. He [name]  3. Benadryl 25 mg IV q.6h., and Pepcid 20 mg IV q.12h.  4. [name]     If wheezing recurs will add albuterol nebulizers.  5.     Will hold medications for now, specifically the antihypertensive     medications. 6. Will cover the patient's blood sugars with sliding scale insulin regimen.  7. [name]  [name], M.D.    DF/MEDQ  D:  11/07/2002  T:  11/07/2002  Job:  [id number]   cc:   Lucky Cowboy, M.D.  [address]  [place], [place] [address]  Fax: [phone]

## 2010-08-07 NOTE — Cardiovascular Report (Signed)
[place]. [facility]  Patient:    JOANDRY, SLAGTER Visit Number: [id number] MRN: [id number]          Service Type: MED Location: [id number] Attending Physician:  Talitha Givens Dictated by:   Daisey Must, M.D. [facility] Proc. Date: 10/10/01 Admit Date:  10/09/2001 Discharge Date: 10/10/2001   CC:         Marinus Maw, M.D.  Luis Abed, M.D. [facility]  Cardiac Catheterization Laboratory   Cardiac Catheterization  PROCEDURES PERFORMED: Left heart catheterization with coronary angiography and left ventriculography.  INDICATIONS: The patient is a 55 year old male with multiple cardiac risk factors including diabetes mellitus. He presented to the hospital with progressive episodes of substernal chest pain and was referred for cardiac catheterization.  DESCRIPTION OF PROCEDURE: A 6 French sheath was placed in the right femoral artery.  Standard Judkins 6 French catheters were utilized.  Contrast was Omnipaque.  There were no complications.  RESULTS:  HEMODYNAMICS: Left ventricular pressure 136/18, aortic pressure 124/72.  There was no aortic valve gradient.  LEFT VENTRICULOGRAM: Wall motion is normal. Ejection fraction calculated at 63%. There is no mitral regurgitation.  CORONARY ARTERIOGRAPHY: (Right dominant).  Left main has distal minor luminal irregularity.  Left anterior descending artery has very minor luminal irregularities in the mid vessel. The LAD gives rise to a normal sized diagonal branch.  Left circumflex gives rise to a normal sized OM-1 and a large OM-2. OM-1 in the midportion has a less than 20% stenosis in a segment of vessel which tends to kink with systole.  The right coronary artery is a dominant vessel. There are minor luminal irregularities in the mid vessel. The distal right coronary artery gives rise to a normal sized posterior descending artery, small first posterolateral branch, normal second  posterolateral branch, and a small third posterolateral branch.  IMPRESSIONS: 1. Normal left ventricular systolic function. 2. No significant coronary artery disease identified. Dictated by:   Daisey Must, M.D. [facility] Attending Physician:  Talitha Givens DD:  10/10/01 TD:  10/13/01 Job: [id number]

## 2010-08-12 ENCOUNTER — Other Ambulatory Visit: Payer: Self-pay | Admitting: *Deleted

## 2010-08-12 MED ORDER — GLIPIZIDE 10 MG PO TABS: 10.0000 mg | ORAL_TABLET | Freq: Two times a day (BID) | ORAL | Status: DC

## 2010-10-07 ENCOUNTER — Other Ambulatory Visit: Payer: Self-pay | Admitting: Internal Medicine

## 2010-10-07 ENCOUNTER — Other Ambulatory Visit (INDEPENDENT_AMBULATORY_CARE_PROVIDER_SITE_OTHER): Payer: 59

## 2010-10-07 DIAGNOSIS — E1165 Type 2 diabetes mellitus with hyperglycemia: Secondary | ICD-10-CM

## 2010-10-07 DIAGNOSIS — IMO0001 Reserved for inherently not codable concepts without codable children: Secondary | ICD-10-CM

## 2010-10-07 LAB — BASIC METABOLIC PANEL
CO2: 29 mEq/L (ref 19–32)
Calcium: 10.5 mg/dL (ref 8.4–10.5)
Chloride: 99 mEq/L (ref 96–112)
Creatinine, Ser: 1.1 mg/dL (ref 0.4–1.5)
Glucose, Bld: 143 mg/dL — ABNORMAL HIGH (ref 70–99)

## 2010-10-07 LAB — LIPID PANEL
HDL: 36.3 mg/dL — ABNORMAL LOW (ref >39.00)
Total CHOL/HDL Ratio: 4
Triglycerides: 287 mg/dL — ABNORMAL HIGH (ref 0.0–149.0)

## 2010-10-07 LAB — LDL CHOLESTEROL, DIRECT: Direct LDL: 95.6 mg/dL

## 2010-10-08 ENCOUNTER — Encounter: Payer: Self-pay | Admitting: Internal Medicine

## 2010-10-08 ENCOUNTER — Ambulatory Visit (INDEPENDENT_AMBULATORY_CARE_PROVIDER_SITE_OTHER): Payer: 59 | Admitting: Internal Medicine

## 2010-10-08 DIAGNOSIS — R05 Cough: Secondary | ICD-10-CM

## 2010-10-08 DIAGNOSIS — T464X5A Adverse effect of angiotensin-converting-enzyme inhibitors, initial encounter: Secondary | ICD-10-CM | POA: Insufficient documentation

## 2010-10-08 DIAGNOSIS — T44905A Adverse effect of unspecified drugs primarily affecting the autonomic nervous system, initial encounter: Secondary | ICD-10-CM

## 2010-10-08 DIAGNOSIS — E119 Type 2 diabetes mellitus without complications: Secondary | ICD-10-CM

## 2010-10-08 DIAGNOSIS — T6391XA Toxic effect of contact with unspecified venomous animal, accidental (unintentional), initial encounter: Secondary | ICD-10-CM

## 2010-10-08 DIAGNOSIS — I2510 Atherosclerotic heart disease of native coronary artery without angina pectoris: Secondary | ICD-10-CM

## 2010-10-08 DIAGNOSIS — R059 Cough, unspecified: Secondary | ICD-10-CM

## 2010-10-08 DIAGNOSIS — E669 Obesity, unspecified: Secondary | ICD-10-CM

## 2010-10-08 MED ORDER — LOSARTAN POTASSIUM 100 MG PO TABS: 100.0000 mg | ORAL_TABLET | Freq: Every day | ORAL | Status: DC

## 2010-10-08 NOTE — Progress Notes (Signed)
  Subjective:    Patient ID: Anthony Woods, male    DOB: 01-21-1956, 55 y.o.   MRN: [id number]  HPI  The patient presents for a follow-up of  chronic hypertension, chronic dyslipidemia, type 2 diabetes controlled with medicines  Wt Readings from Last 3 Encounters:  10/08/10 232 lb (105.235 kg)  07/08/10 240 lb (108.863 kg)  04/08/10 244 lb (110.678 kg)     Review of Systems  Constitutional: Negative for appetite change, fatigue and unexpected weight change.  HENT: Negative for nosebleeds, congestion, sore throat, sneezing, trouble swallowing and neck pain.   Eyes: Negative for itching and visual disturbance.  Respiratory: Positive for cough. Negative for shortness of breath.   Cardiovascular: Negative for chest pain, palpitations and leg swelling.  Gastrointestinal: Negative for nausea, diarrhea, blood in stool and abdominal distention.  Genitourinary: Negative for frequency and hematuria.  Musculoskeletal: Negative for back pain, joint swelling and gait problem.  Skin: Negative for rash.  Neurological: Negative for dizziness, tremors, speech difficulty and weakness.  Psychiatric/Behavioral: Negative for sleep disturbance, dysphoric mood and agitation. The patient is not nervous/anxious.        Objective:   Physical Exam  Constitutional: He is oriented to person, place, and time. He appears well-developed.       obese  HENT:  Mouth/Throat: Oropharynx is clear and moist.  Eyes: Conjunctivae are normal. Pupils are equal, round, and reactive to light.  Neck: Normal range of motion. No JVD present. No thyromegaly present.  Cardiovascular: Normal rate, regular rhythm, normal heart sounds and intact distal pulses.  Exam reveals no gallop and no friction rub.   No murmur heard. Pulmonary/Chest: Effort normal and breath sounds normal. No respiratory distress. He has no wheezes. He has no rales. He exhibits no tenderness.  Abdominal: Soft. Bowel sounds are normal. He exhibits no  distension and no mass. There is no tenderness. There is no rebound and no guarding.  Musculoskeletal: Normal range of motion. He exhibits no edema and no tenderness.  Lymphadenopathy:    He has no cervical adenopathy.  Neurological: He is alert and oriented to person, place, and time. He has normal reflexes. No cranial nerve deficit. He exhibits normal muscle tone. Coordination normal.  Skin: Skin is warm and dry. No rash noted.  Psychiatric: He has a normal mood and affect. His behavior is normal. Judgment and thought content normal.       Lab Results  Component Value Date   WBC 9.3 11/16/2006   HGB 14.3 11/16/2006   HCT 42.2 11/16/2006   PLT 173 11/16/2006   CHOL 155 10/07/2010   TRIG 287.0* 10/07/2010   HDL 36.30* 10/07/2010   LDLDIRECT 95.6 10/07/2010   ALT 21 07/07/2010   AST 15 07/07/2010   NA 138 10/07/2010   K 4.1 10/07/2010   CL 99 10/07/2010   CREATININE 1.1 10/07/2010   BUN 20 10/07/2010   CO2 29 10/07/2010   TSH 0.22* 03/09/2010   HGBA1C 8.8* 10/07/2010       Assessment & Plan:

## 2010-10-08 NOTE — Assessment & Plan Note (Signed)
Wt Readings from Last 3 Encounters:  10/08/10 232 lb (105.235 kg)  07/08/10 240 lb (108.863 kg)  04/08/10 244 lb (110.678 kg)

## 2010-10-08 NOTE — Assessment & Plan Note (Signed)
Risks associated with diet noncompliance were discussed. Compliance was encouraged.

## 2010-10-08 NOTE — Assessment & Plan Note (Signed)
D/c ACE 

## 2010-10-08 NOTE — Assessment & Plan Note (Signed)
On Rx 

## 2010-11-06 ENCOUNTER — Telehealth: Payer: Self-pay | Admitting: *Deleted

## 2010-11-06 ENCOUNTER — Other Ambulatory Visit: Payer: Self-pay | Admitting: *Deleted

## 2010-11-06 MED ORDER — LEVOTHYROXINE SODIUM 175 MCG PO TABS: 175.0000 ug | ORAL_TABLET | Freq: Every day | ORAL | Status: DC

## 2010-11-06 NOTE — Telephone Encounter (Signed)
Rec Rf req for Lisinopril 40mg  1 po qd # 90. Last filled 10-30-10. I dont see this med on active list. Ok to fill?

## 2010-11-09 NOTE — Telephone Encounter (Signed)
OK to fill this prescription with additional refills x11 Thank you!  

## 2010-11-10 MED ORDER — LISINOPRIL 40 MG PO TABS: 40.0000 mg | ORAL_TABLET | Freq: Every day | ORAL | Status: DC

## 2010-12-01 ENCOUNTER — Other Ambulatory Visit: Payer: Self-pay | Admitting: *Deleted

## 2010-12-01 MED ORDER — ATENOLOL-CHLORTHALIDONE 100-25 MG PO TABS: 1.0000 | ORAL_TABLET | Freq: Every day | ORAL | Status: DC

## 2011-01-01 ENCOUNTER — Other Ambulatory Visit: Payer: Self-pay | Admitting: *Deleted

## 2011-01-01 MED ORDER — LEVOTHYROXINE SODIUM 175 MCG PO TABS: 175.0000 ug | ORAL_TABLET | Freq: Every day | ORAL | Status: DC

## 2011-01-12 ENCOUNTER — Other Ambulatory Visit: Payer: Self-pay | Admitting: *Deleted

## 2011-01-12 MED ORDER — SIMVASTATIN 40 MG PO TABS: 40.0000 mg | ORAL_TABLET | Freq: Every day | ORAL | Status: DC

## 2011-02-10 ENCOUNTER — Ambulatory Visit (INDEPENDENT_AMBULATORY_CARE_PROVIDER_SITE_OTHER): Payer: 59 | Admitting: Internal Medicine

## 2011-02-10 ENCOUNTER — Other Ambulatory Visit (INDEPENDENT_AMBULATORY_CARE_PROVIDER_SITE_OTHER): Payer: 59

## 2011-02-10 ENCOUNTER — Encounter: Payer: Self-pay | Admitting: Internal Medicine

## 2011-02-10 VITALS — BP 128/78 | HR 64 | Temp 98.2°F | Resp 16 | Wt 239.0 lb

## 2011-02-10 DIAGNOSIS — E669 Obesity, unspecified: Secondary | ICD-10-CM

## 2011-02-10 DIAGNOSIS — Z0000 Encounter for general adult medical examination without abnormal findings: Secondary | ICD-10-CM

## 2011-02-10 DIAGNOSIS — I10 Essential (primary) hypertension: Secondary | ICD-10-CM

## 2011-02-10 DIAGNOSIS — I2510 Atherosclerotic heart disease of native coronary artery without angina pectoris: Secondary | ICD-10-CM

## 2011-02-10 DIAGNOSIS — E119 Type 2 diabetes mellitus without complications: Secondary | ICD-10-CM

## 2011-02-10 DIAGNOSIS — F17200 Nicotine dependence, unspecified, uncomplicated: Secondary | ICD-10-CM

## 2011-02-10 DIAGNOSIS — E039 Hypothyroidism, unspecified: Secondary | ICD-10-CM

## 2011-02-10 LAB — COMPREHENSIVE METABOLIC PANEL
Albumin: 4.2 g/dL (ref 3.5–5.2)
BUN: 11 mg/dL (ref 6–23)
Calcium: 9.9 mg/dL (ref 8.4–10.5)
Chloride: 101 mEq/L (ref 96–112)
GFR: 88.7 mL/min (ref >60.00)
Glucose, Bld: 215 mg/dL — ABNORMAL HIGH (ref 70–99)
Sodium: 138 mEq/L (ref 135–145)

## 2011-02-10 LAB — HEMOGLOBIN A1C: Hgb A1c MFr Bld: 9.9 % — ABNORMAL HIGH (ref 4.6–6.5)

## 2011-02-10 NOTE — Assessment & Plan Note (Signed)
Wt Readings from Last 3 Encounters:  02/10/11 239 lb (108.41 kg)  10/08/10 232 lb (105.235 kg)  07/08/10 240 lb (108.863 kg)    

## 2011-02-10 NOTE — Assessment & Plan Note (Signed)
Continue with current prescription therapy as reflected on the Med list.  

## 2011-02-10 NOTE — Patient Instructions (Signed)
Wt Readings from Last 3 Encounters:  02/10/11 239 lb (108.41 kg)  10/08/10 232 lb (105.235 kg)  07/08/10 240 lb (108.863 kg)

## 2011-02-10 NOTE — Progress Notes (Signed)
  Subjective:    Patient ID: Anthony Woods, male    DOB: 27-Apr-1955, 55 y.o.   MRN: [id number]  HPI  The patient presents for a follow-up of  chronic hypertension, chronic dyslipidemia, type 2 diabetes controlled with medicines    Review of Systems  Constitutional: Negative for appetite change, fatigue and unexpected weight change.  HENT: Negative for nosebleeds, congestion, sore throat, sneezing, trouble swallowing and neck pain.   Eyes: Negative for itching and visual disturbance.  Respiratory: Negative for cough.   Cardiovascular: Negative for chest pain, palpitations and leg swelling.  Gastrointestinal: Negative for nausea, diarrhea, blood in stool and abdominal distention.  Genitourinary: Negative for frequency and hematuria.  Musculoskeletal: Negative for back pain, joint swelling and gait problem.  Skin: Negative for rash.  Neurological: Negative for dizziness, tremors, speech difficulty and weakness.  Psychiatric/Behavioral: Negative for sleep disturbance, dysphoric mood and agitation. The patient is not nervous/anxious.        Objective:   Physical Exam  Vitals reviewed. Constitutional: He is oriented to person, place, and time. He appears well-developed.       obese  HENT:  Mouth/Throat: Oropharynx is clear and moist.  Eyes: Conjunctivae are normal. Pupils are equal, round, and reactive to light.  Neck: Normal range of motion. No JVD present. No thyromegaly present.  Cardiovascular: Normal rate, regular rhythm, normal heart sounds and intact distal pulses.  Exam reveals no gallop and no friction rub.   No murmur heard. Pulmonary/Chest: Effort normal and breath sounds normal. No respiratory distress. He has no wheezes. He has no rales. He exhibits no tenderness.  Abdominal: Soft. Bowel sounds are normal. He exhibits no distension and no mass. There is no tenderness. There is no rebound and no guarding.  Musculoskeletal: Normal range of motion. He exhibits no edema and no  tenderness.  Lymphadenopathy:    He has no cervical adenopathy.  Neurological: He is alert and oriented to person, place, and time. He has normal reflexes. No cranial nerve deficit. He exhibits normal muscle tone. Coordination normal.  Skin: Skin is warm and dry. No rash noted.  Psychiatric: He has a normal mood and affect. His behavior is normal. Judgment and thought content normal.    Labs today pending      Assessment & Plan:

## 2011-02-10 NOTE — Assessment & Plan Note (Signed)
11/12 - down to 1.5 ppd Discussed

## 2011-02-12 ENCOUNTER — Telehealth: Payer: Self-pay | Admitting: Internal Medicine

## 2011-02-12 NOTE — Telephone Encounter (Signed)
Anthony Woods, please, inform patient that all labs are abnormal - needs better sugar control Thx

## 2011-02-15 NOTE — Telephone Encounter (Signed)
Pt informed

## 2011-02-19 ENCOUNTER — Other Ambulatory Visit: Payer: Self-pay | Admitting: *Deleted

## 2011-02-19 MED ORDER — GLIPIZIDE 10 MG PO TABS: 10.0000 mg | ORAL_TABLET | Freq: Two times a day (BID) | ORAL | Status: DC

## 2011-03-15 ENCOUNTER — Telehealth: Payer: Self-pay

## 2011-03-15 MED ORDER — SAXAGLIPTIN-METFORMIN ER 2.5-1000 MG PO TB24: ORAL_TABLET | ORAL | Status: DC

## 2011-03-15 NOTE — Telephone Encounter (Signed)
Medication refill

## 2011-03-30 ENCOUNTER — Ambulatory Visit (INDEPENDENT_AMBULATORY_CARE_PROVIDER_SITE_OTHER): Payer: 59 | Admitting: Ophthalmology

## 2011-03-30 DIAGNOSIS — H2510 Age-related nuclear cataract, unspecified eye: Secondary | ICD-10-CM

## 2011-03-30 DIAGNOSIS — E1165 Type 2 diabetes mellitus with hyperglycemia: Secondary | ICD-10-CM

## 2011-03-30 DIAGNOSIS — E11319 Type 2 diabetes mellitus with unspecified diabetic retinopathy without macular edema: Secondary | ICD-10-CM

## 2011-03-30 DIAGNOSIS — H3530 Unspecified macular degeneration: Secondary | ICD-10-CM

## 2011-03-30 DIAGNOSIS — E1139 Type 2 diabetes mellitus with other diabetic ophthalmic complication: Secondary | ICD-10-CM

## 2011-06-08 ENCOUNTER — Other Ambulatory Visit (INDEPENDENT_AMBULATORY_CARE_PROVIDER_SITE_OTHER): Payer: 59

## 2011-06-08 DIAGNOSIS — E119 Type 2 diabetes mellitus without complications: Secondary | ICD-10-CM

## 2011-06-08 DIAGNOSIS — Z0000 Encounter for general adult medical examination without abnormal findings: Secondary | ICD-10-CM

## 2011-06-08 LAB — BASIC METABOLIC PANEL
BUN: 12 mg/dL (ref 6–23)
CO2: 31 mEq/L (ref 19–32)
Calcium: 10.1 mg/dL (ref 8.4–10.5)
GFR: 90.82 mL/min (ref >60.00)
Glucose, Bld: 276 mg/dL — ABNORMAL HIGH (ref 70–99)
Sodium: 137 mEq/L (ref 135–145)

## 2011-06-08 LAB — HEPATIC FUNCTION PANEL
ALT: 29 U/L (ref 0–53)
Albumin: 4 g/dL (ref 3.5–5.2)
Alkaline Phosphatase: 68 U/L (ref 39–117)
Bilirubin, Direct: 0.1 mg/dL (ref 0.0–0.3)
Total Protein: 6.8 g/dL (ref 6.0–8.3)

## 2011-06-08 LAB — LIPID PANEL
Cholesterol: 171 mg/dL (ref 0–200)
HDL: 35.3 mg/dL — ABNORMAL LOW (ref >39.00)
VLDL: 81 mg/dL — ABNORMAL HIGH (ref 0.0–40.0)

## 2011-06-08 LAB — URINALYSIS
Bilirubin Urine: NEGATIVE
Hgb urine dipstick: NEGATIVE
Ketones, ur: NEGATIVE
Total Protein, Urine: NEGATIVE
Urine Glucose: >=1000
pH: 6.5 (ref 5.0–8.0)

## 2011-06-08 LAB — CBC WITH DIFFERENTIAL/PLATELET
Basophils Absolute: 0.1 10*3/uL (ref 0.0–0.1)
Eosinophils Absolute: 0.2 10*3/uL (ref 0.0–0.7)
Hemoglobin: 15.8 g/dL (ref 13.0–17.0)
Lymphocytes Relative: 24.6 % (ref 12.0–46.0)
Lymphs Abs: 2.6 10*3/uL (ref 0.7–4.0)
MCHC: 33.5 g/dL (ref 30.0–36.0)
Monocytes Relative: 6.8 % (ref 3.0–12.0)
Neutro Abs: 6.8 10*3/uL (ref 1.4–7.7)
Platelets: 192 10*3/uL (ref 150.0–400.0)
RDW: 13 % (ref 11.5–14.6)

## 2011-06-10 ENCOUNTER — Ambulatory Visit (INDEPENDENT_AMBULATORY_CARE_PROVIDER_SITE_OTHER): Payer: 59 | Admitting: Internal Medicine

## 2011-06-10 ENCOUNTER — Encounter: Payer: Self-pay | Admitting: Internal Medicine

## 2011-06-10 VITALS — BP 140/90 | HR 76 | Temp 97.1°F | Resp 16 | Wt 245.0 lb

## 2011-06-10 DIAGNOSIS — Z0000 Encounter for general adult medical examination without abnormal findings: Secondary | ICD-10-CM

## 2011-06-10 DIAGNOSIS — F17200 Nicotine dependence, unspecified, uncomplicated: Secondary | ICD-10-CM

## 2011-06-10 DIAGNOSIS — I10 Essential (primary) hypertension: Secondary | ICD-10-CM

## 2011-06-10 DIAGNOSIS — E669 Obesity, unspecified: Secondary | ICD-10-CM | POA: Insufficient documentation

## 2011-06-10 DIAGNOSIS — E039 Hypothyroidism, unspecified: Secondary | ICD-10-CM

## 2011-06-10 DIAGNOSIS — I2510 Atherosclerotic heart disease of native coronary artery without angina pectoris: Secondary | ICD-10-CM

## 2011-06-10 DIAGNOSIS — E119 Type 2 diabetes mellitus without complications: Secondary | ICD-10-CM

## 2011-06-10 DIAGNOSIS — E6609 Other obesity due to excess calories: Secondary | ICD-10-CM | POA: Insufficient documentation

## 2011-06-10 NOTE — Assessment & Plan Note (Signed)
Continue with current prescription therapy as reflected on the Med list.  

## 2011-06-10 NOTE — Assessment & Plan Note (Signed)
Discussed.

## 2011-06-10 NOTE — Progress Notes (Signed)
Patient ID: Anthony Woods, male   DOB: 06-Feb-1956, 56 y.o.   MRN: [id number]  Subjective:    Patient ID: Anthony Woods, male    DOB: 1956/02/12, 56 y.o.   MRN: [id number]  HPI The patient is here for a wellness exam. The patient has been doing well overall without major physical or psychological issues going on lately. The patient presents for a follow-up of  chronic hypertension, chronic dyslipidemia, type 2 diabetes not controlled with medicines; gained wt He cont to smoke  Wt Readings from Last 3 Encounters:  06/10/11 245 lb (111.131 kg)  02/10/11 239 lb (108.41 kg)  10/08/10 232 lb (105.235 kg)   BP Readings from Last 3 Encounters:  06/10/11 140/90  02/10/11 128/78  10/08/10 112/68      Review of Systems  Constitutional: Negative for appetite change, fatigue and unexpected weight change.  HENT: Negative for nosebleeds, congestion, sore throat, sneezing, trouble swallowing and neck pain.   Eyes: Negative for itching and visual disturbance.  Respiratory: Negative for cough.   Cardiovascular: Negative for chest pain, palpitations and leg swelling.  Gastrointestinal: Negative for nausea, diarrhea, blood in stool and abdominal distention.  Genitourinary: Negative for frequency and hematuria.  Musculoskeletal: Negative for back pain, joint swelling and gait problem.  Skin: Negative for rash.  Neurological: Negative for dizziness, tremors, speech difficulty and weakness.  Psychiatric/Behavioral: Negative for sleep disturbance, dysphoric mood and agitation. The patient is not nervous/anxious.        Objective:   Physical Exam  Vitals reviewed. Constitutional: He is oriented to person, place, and time. He appears well-developed.       obese  HENT:  Mouth/Throat: Oropharynx is clear and moist.  Eyes: Conjunctivae are normal. Pupils are equal, round, and reactive to light.  Neck: Normal range of motion. No JVD present. No thyromegaly present.  Cardiovascular: Normal rate,  regular rhythm, normal heart sounds and intact distal pulses.  Exam reveals no gallop and no friction rub.   No murmur heard. Pulmonary/Chest: Effort normal and breath sounds normal. No respiratory distress. He has no wheezes. He has no rales. He exhibits no tenderness.  Abdominal: Soft. Bowel sounds are normal. He exhibits no distension and no mass. There is no tenderness. There is no rebound and no guarding.  Musculoskeletal: Normal range of motion. He exhibits no edema and no tenderness.  Lymphadenopathy:    He has no cervical adenopathy.  Neurological: He is alert and oriented to person, place, and time. He has normal reflexes. No cranial nerve deficit. He exhibits normal muscle tone. Coordination normal.  Skin: Skin is warm and dry. No rash noted.  Psychiatric: He has a normal mood and affect. His behavior is normal. Judgment and thought content normal.    Lab Results  Component Value Date   WBC 10.4 06/08/2011   HGB 15.8 06/08/2011   HCT 47.1 06/08/2011   PLT 192.0 06/08/2011   GLUCOSE 276* 06/08/2011   CHOL 171 06/08/2011   TRIG 405.0* 06/08/2011   HDL 35.30* 06/08/2011   LDLDIRECT 90.6 06/08/2011   LDLCALC 90 06/05/2009   ALT 29 06/08/2011   AST 21 06/08/2011   NA 137 06/08/2011   K 4.8 06/08/2011   CL 97 06/08/2011   CREATININE 0.9 06/08/2011   BUN 12 06/08/2011   CO2 31 06/08/2011   TSH 0.55 06/08/2011   PSA 0.30 06/08/2011   HGBA1C 11.6* 06/08/2011         Assessment & Plan:

## 2011-06-10 NOTE — Assessment & Plan Note (Signed)
We discussed age appropriate health related issues, including available/recomended screening tests and vaccinations. We discussed a need for adhering to healthy diet and exercise. Labs/EKG were reviewed/ordered. All questions were answered.   

## 2011-06-10 NOTE — Assessment & Plan Note (Signed)
Bariatric surg consult

## 2011-06-10 NOTE — Assessment & Plan Note (Signed)
Worse Continue with current prescription therapy as reflected on the Med list.  

## 2011-06-10 NOTE — Assessment & Plan Note (Signed)
Worse. Endocr cons Dr Everardo All

## 2011-06-22 ENCOUNTER — Other Ambulatory Visit: Payer: Self-pay | Admitting: *Deleted

## 2011-06-22 MED ORDER — ATENOLOL-CHLORTHALIDONE 100-25 MG PO TABS: 1.0000 | ORAL_TABLET | Freq: Every day | ORAL | Status: DC

## 2011-07-16 ENCOUNTER — Other Ambulatory Visit: Payer: Self-pay | Admitting: Internal Medicine

## 2011-07-19 ENCOUNTER — Ambulatory Visit (INDEPENDENT_AMBULATORY_CARE_PROVIDER_SITE_OTHER): Payer: 59 | Admitting: Endocrinology

## 2011-07-19 ENCOUNTER — Encounter: Payer: Self-pay | Admitting: Endocrinology

## 2011-07-19 VITALS — BP 138/82 | HR 60 | Temp 98.4°F | Ht 65.0 in | Wt 247.0 lb

## 2011-07-19 DIAGNOSIS — E119 Type 2 diabetes mellitus without complications: Secondary | ICD-10-CM

## 2011-07-19 NOTE — Patient Instructions (Addendum)
good diet and exercise habits significanly improve the control of your diabetes.  please let me know if you wish to be referred to a dietician.  high blood sugar is very risky to your health.  you should see an eye doctor every year. controlling your blood pressure and cholesterol drastically reduces the damage diabetes does to your body.  this also applies to quitting smoking.  please discuss these with your doctor.  you should take an aspirin every day, unless you have been advised by a doctor not to.   check your blood sugar 2 times a day.  vary the time of day when you check, between before the 3 meals, and at bedtime.  also check if you have symptoms of your blood sugar being too high or too low.  please keep a record of the readings and bring it to your next appointment here.  please call us sooner if your blood sugar goes below 70, or if it stays over 200. Please come back for a follow-up appointment in 2 weeks.   Stop diabetes pills as you run out of them. Start humalog 5 units 3x a day (just before each meal).   Refer to a dietician specialist.  you will receive a phone call, about a day and time for an appointment.

## 2011-07-19 NOTE — Progress Notes (Signed)
Subjective:    Patient ID: Anthony Woods, male    DOB: 12/25/1955, 56 y.o.   MRN: [id number]  HPI Pt returns for f/u of IDDM (dx'ed 1998, complicated by CAD).  He was advised to start insulin more than 1 year ago, but did not do so.  pt states he feels well in general.  He was turned down for bariatric surgery because he is a smoker. Past Medical History  Diagnosis Date  . CAD (coronary artery disease)   . Type II or unspecified type diabetes mellitus without mention of complication, not stated as uncontrolled   . HTN (hypertension)   . Hypothyroidism   . Allergy to bee sting   . Macular degeneration     No past surgical history on file.  History   Social History  . Marital Status: Married    Spouse Name: N/A    Number of Children: N/A  . Years of Education: N/A   Occupational History  . [employer]    Social History Main Topics  . Smoking status: Current Everyday Smoker -- 2.0 packs/day  . Smokeless tobacco: Not on file  . Alcohol Use: No  . Drug Use: No  . Sexually Active: Yes   Other Topics Concern  . Not on file   Social History Narrative  . No narrative on file    Current Outpatient Prescriptions on File Prior to Visit  Medication Sig Dispense Refill  . aspirin 81 MG EC tablet Take 81 mg by mouth daily.        Marland Kitchen atenolol-chlorthalidone (TENORETIC) 100-25 MG per tablet Take 1 tablet by mouth daily.  30 tablet  5  . glipiZIDE (GLUCOTROL) 10 MG tablet Take 1 tablet (10 mg total) by mouth 2 (two) times daily.  60 tablet  5  . glucose blood (ONE TOUCH ULTRA TEST) test strip 1 each by Other route 2 (two) times daily. Use as instructed       . Insulin Lispro, Human, (HUMALOG KWIKPEN [place]) Inject 5 Units into the skin 3 (three) times daily. (just before each meal)      . levothyroxine (SYNTHROID, LEVOTHROID) 175 MCG tablet Take 1 tablet (175 mcg total) by mouth daily.  90 tablet  2  . losartan (COZAAR) 100 MG tablet Take 1 tablet (100 mg total) by mouth daily.  90  tablet  3  . Saxagliptin-Metformin (KOMBIGLYZE XR) 2.07-998 MG TB24 Take by mouth 2 (two) times daily.  60 tablet  5  . simvastatin (ZOCOR) 40 MG tablet Take 1 tablet (40 mg total) by mouth at bedtime.  90 tablet  2  . EPIPEN 2-PAK 0.3 MG/0.3ML DEVI USE AS DIRECTED FOR BEE STINGS.  2 each  1  . predniSONE (DELTASONE) 10 MG tablet Take 6 tablets (60 mg total) by mouth as needed.  30 tablet  1  . varenicline (CHANTIX) 1 MG tablet Take 1 mg by mouth 2 (two) times daily.          Allergies  Allergen Reactions  . Actos (Pioglitazone Hydrochloride)     swelling  . Lisinopril     cough    Family History  Problem Relation Age of Onset  . Hypertension Other   . Diabetes Father   . Cancer Father     stomach  . Diabetes Mother   . Cancer Mother     male ca  . Diabetes Other     BP 138/82  Pulse 60  Temp(Src) 98.4 F (36.9 C) (  Oral)  Ht 5\' 5"  (1.651 m)  Wt 247 lb (112.038 kg)  BMI 41.10 kg/m2  SpO2 96%  Review of Systems He lost weight, but has since regained.      Objective:   Physical Exam VITAL SIGNS:  See vs page. GENERAL: no distress. Pulses: dorsalis pedis intact bilat.   Feet: no deformity.  no ulcer on the feet.  feet are of normal color and temp.  Trace bilat leg edema Neuro: sensation is intact to touch on the feet.    Lab Results  Component Value Date   HGBA1C 11.6* 06/08/2011      Assessment & Plan:  DM.  needs increased rx

## 2011-07-20 ENCOUNTER — Telehealth: Payer: Self-pay | Admitting: *Deleted

## 2011-07-20 MED ORDER — PREDNISONE 10 MG PO TABS: 60.0000 mg | ORAL_TABLET | ORAL | Status: DC | PRN

## 2011-07-20 NOTE — Telephone Encounter (Signed)
OK to fill this prescription with additional refills x1 Thank you!  

## 2011-07-20 NOTE — Telephone Encounter (Signed)
Done

## 2011-07-20 NOTE — Telephone Encounter (Signed)
Requested Medications     predniSONE (DELTASONE) 10 MG tablet [Pharmacy Med Name: PREDNISONE 10 MG TABLET TAB 10 MG]   The source prescription has been discontinued.   TAKE 6 TABLETS BY MOUTH AS NEEDED.   Disp: 30 tablet R: 1 Start: 07/16/2011  Class: Normal   Requested on: 06/09/2010   Originally ordered on: 06/09/2010  Last refill: 06/09/2010

## 2011-07-22 ENCOUNTER — Other Ambulatory Visit: Payer: Self-pay | Admitting: Endocrinology

## 2011-08-02 ENCOUNTER — Encounter: Payer: Self-pay | Admitting: Endocrinology

## 2011-08-02 ENCOUNTER — Ambulatory Visit (INDEPENDENT_AMBULATORY_CARE_PROVIDER_SITE_OTHER): Payer: 59 | Admitting: Endocrinology

## 2011-08-02 VITALS — BP 134/78 | HR 61 | Temp 98.2°F | Ht 65.0 in | Wt 245.0 lb

## 2011-08-02 DIAGNOSIS — E119 Type 2 diabetes mellitus without complications: Secondary | ICD-10-CM

## 2011-08-02 NOTE — Progress Notes (Signed)
Subjective:    Patient ID: Anthony Woods, male    DOB: 20-Sep-1955, 56 y.o.   MRN: [id number]  HPI Pt returns for f/u of IDDM (dx'ed 1998, complicated by CAD).  he brings a record of his cbg's which i have reviewed today. It varies from 117-300.  There is no trend throughout the day. Past Medical History  Diagnosis Date  . CAD (coronary artery disease)   . Type II or unspecified type diabetes mellitus without mention of complication, not stated as uncontrolled   . HTN (hypertension)   . Hypothyroidism   . Allergy to bee sting   . Macular degeneration     No past surgical history on file.  History   Social History  . Marital Status: Married    Spouse Name: N/A    Number of Children: N/A  . Years of Education: N/A   Occupational History  . [employer]    Social History Main Topics  . Smoking status: Current Everyday Smoker -- 2.0 packs/day  . Smokeless tobacco: Not on file  . Alcohol Use: No  . Drug Use: No  . Sexually Active: Yes   Other Topics Concern  . Not on file   Social History Narrative  . No narrative on file    Current Outpatient Prescriptions on File Prior to Visit  Medication Sig Dispense Refill  . aspirin 81 MG EC tablet Take 81 mg by mouth daily.        Marland Kitchen atenolol-chlorthalidone (TENORETIC) 100-25 MG per tablet Take 1 tablet by mouth daily.  30 tablet  5  . EPIPEN 2-PAK 0.3 MG/0.3ML DEVI USE AS DIRECTED FOR BEE STINGS.  2 each  1  . Insulin Lispro, Human, (HUMALOG KWIKPEN [place]) Inject 10 Units into the skin 3 (three) times daily. (just before each meal)      . Lancets (ONETOUCH ULTRASOFT) lancets TEST TWO TIMES A DAY  100 each  5  . levothyroxine (SYNTHROID, LEVOTHROID) 175 MCG tablet Take 1 tablet (175 mcg total) by mouth daily.  90 tablet  2  . losartan (COZAAR) 100 MG tablet Take 1 tablet (100 mg total) by mouth daily.  90 tablet  3  . ONE TOUCH ULTRA TEST test strip TEST TWO TIMES A DAY  100 each  5  . predniSONE (DELTASONE) 10 MG tablet Take 6  tablets (60 mg total) by mouth as needed.  30 tablet  1  . simvastatin (ZOCOR) 40 MG tablet Take 1 tablet (40 mg total) by mouth at bedtime.  90 tablet  2  . varenicline (CHANTIX) 1 MG tablet Take 1 mg by mouth 2 (two) times daily.          Allergies  Allergen Reactions  . Actos (Pioglitazone Hydrochloride)     swelling  . Lisinopril     cough    Family History  Problem Relation Age of Onset  . Hypertension Other   . Diabetes Father   . Cancer Father     stomach  . Diabetes Mother   . Cancer Mother     male ca  . Diabetes Other    BP 134/78  Pulse 61  Temp(Src) 98.2 F (36.8 C) (Oral)  Ht 5\' 5"  (1.651 m)  Wt 245 lb (111.131 kg)  BMI 40.77 kg/m2  SpO2 95%  Review of Systems denies hypoglycemia    Objective:   Physical Exam VITAL SIGNS:  See vs page GENERAL: no distress PSYCH: Alert and oriented x 3.  Does  not appear anxious nor depressed.     Assessment & Plan:  DM. Improved.

## 2011-08-02 NOTE — Patient Instructions (Addendum)
check your blood sugar 2 times a day.  vary the time of day when you check, between before the 3 meals, and at bedtime.  also check if you have symptoms of your blood sugar being too high or too low.  please keep a record of the readings and bring it to your next appointment here.  please call us sooner if your blood sugar goes below 70, or if it stays over 200. Please come back for a follow-up appointment in 2 weeks.   Stop glipizide Increase humalog to 10 units 3x a day (just before each meal).

## 2011-08-17 ENCOUNTER — Ambulatory Visit (INDEPENDENT_AMBULATORY_CARE_PROVIDER_SITE_OTHER): Payer: 59 | Admitting: Endocrinology

## 2011-08-17 ENCOUNTER — Encounter: Payer: Self-pay | Admitting: Endocrinology

## 2011-08-17 VITALS — BP 124/78 | HR 56 | Temp 97.2°F | Ht 65.0 in | Wt 244.0 lb

## 2011-08-17 DIAGNOSIS — E119 Type 2 diabetes mellitus without complications: Secondary | ICD-10-CM

## 2011-08-17 NOTE — Progress Notes (Signed)
Subjective:    Patient ID: Anthony Woods, male    DOB: 10/09/1955, 56 y.o.   MRN: [id number]  HPI Pt returns for f/u of IDDM (dx'ed 1998, complicated by CAD).  he brings a record of his cbg's which i have reviewed today. It varies from 155-500.  There is no trend throughout the day, except that it may be lower in the afternoon.  He seldom takes the prednisone (when he is stung be a bee).   Past Medical History  Diagnosis Date  . CAD (coronary artery disease)   . Type II or unspecified type diabetes mellitus without mention of complication, not stated as uncontrolled   . HTN (hypertension)   . Hypothyroidism   . Allergy to bee sting   . Macular degeneration     No past surgical history on file.  History   Social History  . Marital Status: Married    Spouse Name: N/A    Number of Children: N/A  . Years of Education: N/A   Occupational History  . [employer]    Social History Main Topics  . Smoking status: Current Everyday Smoker -- 2.0 packs/day  . Smokeless tobacco: Not on file  . Alcohol Use: No  . Drug Use: No  . Sexually Active: Yes   Other Topics Concern  . Not on file   Social History Narrative  . No narrative on file    Current Outpatient Prescriptions on File Prior to Visit  Medication Sig Dispense Refill  . aspirin 81 MG EC tablet Take 81 mg by mouth daily.        Marland Kitchen atenolol-chlorthalidone (TENORETIC) 100-25 MG per tablet Take 1 tablet by mouth daily.  30 tablet  5  . EPIPEN 2-PAK 0.3 MG/0.3ML DEVI USE AS DIRECTED FOR BEE STINGS.  2 each  1  . Insulin Lispro, Human, (HUMALOG KWIKPEN [place]) 3x a day (just before each meal) 15-10-15 units.      . Lancets (ONETOUCH ULTRASOFT) lancets TEST TWO TIMES A DAY  100 each  5  . levothyroxine (SYNTHROID, LEVOTHROID) 175 MCG tablet Take 1 tablet (175 mcg total) by mouth daily.  90 tablet  2  . losartan (COZAAR) 100 MG tablet Take 1 tablet (100 mg total) by mouth daily.  90 tablet  3  . ONE TOUCH ULTRA TEST test strip TEST  TWO TIMES A DAY  100 each  5  . predniSONE (DELTASONE) 10 MG tablet Take 6 tablets (60 mg total) by mouth as needed.  30 tablet  1  . simvastatin (ZOCOR) 40 MG tablet Take 1 tablet (40 mg total) by mouth at bedtime.  90 tablet  2  . varenicline (CHANTIX) 1 MG tablet Take 1 mg by mouth 2 (two) times daily.          Allergies  Allergen Reactions  . Actos (Pioglitazone Hydrochloride)     swelling  . Lisinopril     cough    Family History  Problem Relation Age of Onset  . Hypertension Other   . Diabetes Father   . Cancer Father     stomach  . Diabetes Mother   . Cancer Mother     male ca  . Diabetes Other     BP 124/78  Pulse 56  Temp(Src) 97.2 F (36.2 C) (Oral)  Ht 5\' 5"  (1.651 m)  Wt 244 lb (110.678 kg)  BMI 40.60 kg/m2  SpO2 96%  Review of Systems denies hypoglycemia    Objective:  Physical Exam VITAL SIGNS:  See vs page GENERAL: no distress PSYCH: Alert and oriented x 3.  Does not appear anxious nor depressed.     Assessment & Plan:  DM.  needs increased rx

## 2011-08-17 NOTE — Patient Instructions (Addendum)
check your blood sugar 2 times a day.  vary the time of day when you check, between before the 3 meals, and at bedtime.  also check if you have symptoms of your blood sugar being too high or too low.  please keep a record of the readings and bring it to your next appointment here.  please call us sooner if your blood sugar goes below 70, or if it stays over 200. Please come back for a follow-up appointment in 1 month.   Increase humalog to 3x a day (just before each meal) 15-10-15 units.

## 2011-08-27 ENCOUNTER — Encounter: Payer: 59 | Attending: Endocrinology | Admitting: *Deleted

## 2011-08-27 ENCOUNTER — Encounter: Payer: Self-pay | Admitting: *Deleted

## 2011-08-27 VITALS — Ht 65.0 in | Wt 241.7 lb

## 2011-08-27 DIAGNOSIS — E119 Type 2 diabetes mellitus without complications: Secondary | ICD-10-CM

## 2011-08-27 DIAGNOSIS — Z713 Dietary counseling and surveillance: Secondary | ICD-10-CM | POA: Insufficient documentation

## 2011-08-27 NOTE — Progress Notes (Signed)
Medical Nutrition Therapy:  Appt start time: 0800 end time:  0900.   Assessment:  Primary concerns today: diabetes.   MEDICATIONS: see list   DIETARY INTAKE:  Usual eating pattern includes 3 meals and 2-3 snacks per day.  Everyday foods include high fat, high sodium, high refined carbohydrates.  Avoided foods include most vegetables/fruits.    24-hr recall:  B ( AM): most days: 1.5 cups corn flakes with whole milk  Snk ( AM): sometimes: packet Nabs or banana with diet soda L ( PM): sandwich on wheat bread [place] with small amount of mayonnaise; with packet of Nabs. Drinks diet soda  Snk ( PM): none D ( PM): lean meats with small baked potato with salad; sandwich with Nabs or cereal Snk ( PM): cereal, Nabs, sandwich, anything Beverages: diet soda  Usual physical activity: walk at job  Estimated energy needs: 2000 calories  225 g carbohydrates 150 g protein 56 g fat  Progress Towards Goal(s):  In progress.   Nutritional Diagnosis:  NB-1.1 Food and nutrition-related knowledge deficit related to healthy food choices.  As evidenced by uncontrolled diabetes, hypertension, and hypercholesterolemia.    Intervention:  Nutrition counseling provided.  Client has DM for over 15 years, but never received proper diabetes education.  He follows no specific meal plan and his most recent HgA1C was 11.6%.  Discussed etiology of diabetes and disease progression.  Discussed role of obesity and smoking on disease progression and complications of elevated A1C.  Discussed carb counting, reading food labels, limiting dietary fats (especially saturated and trans fats), as well as limiting sodium.  Encouraged physical activity.  Patient did not seem interested in nutrition education, but wife was supportive and willing to make changes.  Handouts given during visit include: Carb Counting and Food Label handouts Meal Plan Card  Goals:  Follow Diabetes Meal Plan as instructed  Eat 3 meals and 2  snacks, every 3-5 hrs  Limit carbohydrate intake to 45-60 grams carbohydrate/meal  Limit carbohydrate intake to 15 grams carbohydrate/snack  Add lean protein foods to meals/snacks  Monitor glucose levels as instructed by your doctor  Aim for 10 mins of physical activity daily  Bring food record and glucose log to your next nutrition visit  Limit saturated fats and aim for no trans fat; increase unsaturated fat  Choose leaner option like 2% milk and chicken/fish vs red meat  Look for Calorie Brooke Dare book to use when eating out  Monitoring/Evaluation:  Dietary intake, exercise, and body weight prn.  Encouraged patient to make follow-up appointment.

## 2011-08-27 NOTE — Patient Instructions (Signed)
Goals:  Follow Diabetes Meal Plan as instructed  Eat 3 meals and 2 snacks, every 3-5 hrs  Limit carbohydrate intake to 45-60 grams carbohydrate/meal  Limit carbohydrate intake to 15 grams carbohydrate/snack  Add lean protein foods to meals/snacks  Monitor glucose levels as instructed by your doctor  Aim for 10 mins of physical activity daily  Bring food record and glucose log to your next nutrition visit  Limit saturated fats and aim for no trans fat; increase unsaturated fat  Choose leaner option like 2% milk and chicken/fish vs red meat  Look for Calorie Brooke Dare book to use when eating out

## 2011-09-13 ENCOUNTER — Ambulatory Visit (INDEPENDENT_AMBULATORY_CARE_PROVIDER_SITE_OTHER): Payer: 59 | Admitting: Internal Medicine

## 2011-09-13 ENCOUNTER — Encounter: Payer: Self-pay | Admitting: Internal Medicine

## 2011-09-13 ENCOUNTER — Ambulatory Visit (INDEPENDENT_AMBULATORY_CARE_PROVIDER_SITE_OTHER): Payer: 59 | Admitting: Endocrinology

## 2011-09-13 ENCOUNTER — Encounter: Payer: Self-pay | Admitting: Endocrinology

## 2011-09-13 ENCOUNTER — Telehealth: Payer: Self-pay | Admitting: Internal Medicine

## 2011-09-13 ENCOUNTER — Other Ambulatory Visit (INDEPENDENT_AMBULATORY_CARE_PROVIDER_SITE_OTHER): Payer: 59

## 2011-09-13 VITALS — BP 118/72 | HR 52 | Temp 98.3°F | Wt 237.0 lb

## 2011-09-13 VITALS — BP 118/72 | HR 52 | Temp 97.0°F | Ht 65.0 in | Wt 238.0 lb

## 2011-09-13 DIAGNOSIS — I2510 Atherosclerotic heart disease of native coronary artery without angina pectoris: Secondary | ICD-10-CM

## 2011-09-13 DIAGNOSIS — E119 Type 2 diabetes mellitus without complications: Secondary | ICD-10-CM

## 2011-09-13 DIAGNOSIS — I10 Essential (primary) hypertension: Secondary | ICD-10-CM

## 2011-09-13 DIAGNOSIS — T6391XA Toxic effect of contact with unspecified venomous animal, accidental (unintentional), initial encounter: Secondary | ICD-10-CM

## 2011-09-13 LAB — BASIC METABOLIC PANEL
BUN: 12 mg/dL (ref 6–23)
Calcium: 10.2 mg/dL (ref 8.4–10.5)
Creatinine, Ser: 1.1 mg/dL (ref 0.4–1.5)
GFR: 77.05 mL/min (ref >60.00)
Potassium: 4.2 mEq/L (ref 3.5–5.1)

## 2011-09-13 LAB — HEMOGLOBIN A1C: Hgb A1c MFr Bld: 12 % — ABNORMAL HIGH (ref 4.6–6.5)

## 2011-09-13 MED ORDER — "PEN NEEDLES 5/16"" 31G X 8 MM MISC": 1.0000 | Freq: Three times a day (TID) | Status: DC

## 2011-09-13 NOTE — Assessment & Plan Note (Signed)
He has a kit of Rx prn

## 2011-09-13 NOTE — Telephone Encounter (Signed)
Anthony Woods, please, inform patient that his DM is worse. Needs to f/up w/Dr Purvis Sheffield

## 2011-09-13 NOTE — Patient Instructions (Addendum)
check your blood sugar 2 times a day.  vary the time of day when you check, between before the 3 meals, and at bedtime.  also check if you have symptoms of your blood sugar being too high or too low.  please keep a record of the readings and bring it to your next appointment here.  please call us sooner if your blood sugar goes below 70, or if it stays over 200. Please come back for a follow-up appointment in 1 month.   Increase humalog to 3x a day (just before each meal) 20-15-25 units.

## 2011-09-13 NOTE — Assessment & Plan Note (Signed)
Continue with current prescription therapy as reflected on the Med list.  

## 2011-09-13 NOTE — Assessment & Plan Note (Signed)
Better Continue with current prescription therapy as reflected on the Med list.  

## 2011-09-13 NOTE — Assessment & Plan Note (Signed)
  6/13 - lost 4 lbs - s/p dietary consult

## 2011-09-13 NOTE — Progress Notes (Signed)
   Subjective:    Patient ID: Anthony Woods, male    DOB: 01-23-1956, 56 y.o.   MRN: [id number]  HPI  The patient presents for a follow-up of  chronic hypertension, chronic dyslipidemia, type 2 diabetes not controlled with medicines; lost wt He cont to smoke  Wt Readings from Last 3 Encounters:  09/13/11 237 lb (107.502 kg)  09/13/11 238 lb (107.956 kg)  08/27/11 241 lb 11.2 oz (109.634 kg)   BP Readings from Last 3 Encounters:  09/13/11 118/72  09/13/11 118/72  08/17/11 124/78      Review of Systems  Constitutional: Negative for appetite change, fatigue and unexpected weight change.  HENT: Negative for nosebleeds, congestion, sore throat, sneezing, trouble swallowing and neck pain.   Eyes: Negative for itching and visual disturbance.  Respiratory: Negative for cough.   Cardiovascular: Negative for chest pain, palpitations and leg swelling.  Gastrointestinal: Negative for nausea, diarrhea, blood in stool and abdominal distention.  Genitourinary: Negative for frequency and hematuria.  Musculoskeletal: Negative for back pain, joint swelling and gait problem.  Skin: Negative for rash.  Neurological: Negative for dizziness, tremors, speech difficulty and weakness.  Psychiatric/Behavioral: Negative for disturbed wake/sleep cycle, dysphoric mood and agitation. The patient is not nervous/anxious.        Objective:   Physical Exam  Vitals reviewed. Constitutional: He is oriented to person, place, and time. He appears well-developed.       obese  HENT:  Mouth/Throat: Oropharynx is clear and moist.  Eyes: Conjunctivae are normal. Pupils are equal, round, and reactive to light.  Neck: Normal range of motion. No JVD present. No thyromegaly present.  Cardiovascular: Normal rate, regular rhythm, normal heart sounds and intact distal pulses.  Exam reveals no gallop and no friction rub.   No murmur heard. Pulmonary/Chest: Effort normal and breath sounds normal. No respiratory  distress. He has no wheezes. He has no rales. He exhibits no tenderness.  Abdominal: Soft. Bowel sounds are normal. He exhibits no distension and no mass. There is no tenderness. There is no rebound and no guarding.  Musculoskeletal: Normal range of motion. He exhibits no edema and no tenderness.  Lymphadenopathy:    He has no cervical adenopathy.  Neurological: He is alert and oriented to person, place, and time. He has normal reflexes. No cranial nerve deficit. He exhibits normal muscle tone. Coordination normal.  Skin: Skin is warm and dry. No rash noted.  Psychiatric: He has a normal mood and affect. His behavior is normal. Judgment and thought content normal.    Lab Results  Component Value Date   WBC 10.4 06/08/2011   HGB 15.8 06/08/2011   HCT 47.1 06/08/2011   PLT 192.0 06/08/2011   GLUCOSE 276* 06/08/2011   CHOL 171 06/08/2011   TRIG 405.0* 06/08/2011   HDL 35.30* 06/08/2011   LDLDIRECT 90.6 06/08/2011   LDLCALC 90 06/05/2009   ALT 29 06/08/2011   AST 21 06/08/2011   NA 137 06/08/2011   K 4.8 06/08/2011   CL 97 06/08/2011   CREATININE 0.9 06/08/2011   BUN 12 06/08/2011   CO2 31 06/08/2011   TSH 0.55 06/08/2011   PSA 0.30 06/08/2011   HGBA1C 11.6* 06/08/2011         Assessment & Plan:

## 2011-09-13 NOTE — Progress Notes (Signed)
Subjective:    Patient ID: Anthony Woods, male    DOB: [birth year], 56 y.o.   MRN: [id number]  HPI Pt returns for f/u of IDDM (dx'ed 1998, complicated by CAD).  no cbg record, but states cbg's are highest at hs.  It varies from 200-400.  It is in general higher as the day goes on.  pt states he feels well in general, except for fatigue.   Past Medical History  Diagnosis Date  . CAD (coronary artery disease)   . Type II or unspecified type diabetes mellitus without mention of complication, not stated as uncontrolled   . HTN (hypertension)   . Hypothyroidism   . Allergy to bee sting   . Macular degeneration   . Hyperlipidemia   . Obesity     No past surgical history on file.  History   Social History  . Marital Status: Married    Spouse Name: N/A    Number of Children: N/A  . Years of Education: N/A   Occupational History  . [employer]    Social History Main Topics  . Smoking status: Current Everyday Smoker -- 2.0 packs/day  . Smokeless tobacco: Not on file  . Alcohol Use: No  . Drug Use: No  . Sexually Active: Yes   Other Topics Concern  . Not on file   Social History Narrative  . No narrative on file    Current Outpatient Prescriptions on File Prior to Visit  Medication Sig Dispense Refill  . aspirin 81 MG EC tablet Take 81 mg by mouth daily.        Marland Kitchen atenolol-chlorthalidone (TENORETIC) 100-25 MG per tablet Take 1 tablet by mouth daily.  30 tablet  5  . EPIPEN 2-PAK 0.3 MG/0.3ML DEVI USE AS DIRECTED FOR BEE STINGS.  2 each  1  . Insulin Lispro, Human, (HUMALOG KWIKPEN [place]) 3x a day (just before each meal) 15-10-15 units.      . Lancets (ONETOUCH ULTRASOFT) lancets TEST TWO TIMES A DAY  100 each  5  . levothyroxine (SYNTHROID, LEVOTHROID) 175 MCG tablet Take 1 tablet (175 mcg total) by mouth daily.  90 tablet  2  . losartan (COZAAR) 100 MG tablet Take 1 tablet (100 mg total) by mouth daily.  90 tablet  3  . ONE TOUCH ULTRA TEST test strip TEST TWO TIMES A DAY  100  each  5  . predniSONE (DELTASONE) 10 MG tablet Take 6 tablets (60 mg total) by mouth as needed.  30 tablet  1  . simvastatin (ZOCOR) 40 MG tablet Take 1 tablet (40 mg total) by mouth at bedtime.  90 tablet  2  . varenicline (CHANTIX) 1 MG tablet Take 1 mg by mouth 2 (two) times daily.          Allergies  Allergen Reactions  . Actos (Pioglitazone Hydrochloride)     swelling  . Lisinopril     cough    Family History  Problem Relation Age of Onset  . Hypertension Other   . Diabetes Father   . Cancer Father     stomach  . Diabetes Mother   . Cancer Mother     male ca  . Diabetes Other     BP 118/72  Pulse 52  Temp 97 F (36.1 C) (Oral)  Ht 5\' 5"  (1.651 m)  Wt 238 lb (107.956 kg)  BMI 39.61 kg/m2  SpO2 97%  Review of Systems denies hypoglycemia.      Objective:  Physical Exam VITAL SIGNS:  See vs page GENERAL: no distress. SKIN:  Insulin injection sites at the anterior abdomen are normal      Assessment & Plan:  DM.  He needs increased rx

## 2011-09-14 NOTE — Telephone Encounter (Signed)
Pt informed

## 2011-09-27 ENCOUNTER — Ambulatory Visit (INDEPENDENT_AMBULATORY_CARE_PROVIDER_SITE_OTHER): Payer: 59 | Admitting: Ophthalmology

## 2011-09-27 DIAGNOSIS — H43819 Vitreous degeneration, unspecified eye: Secondary | ICD-10-CM

## 2011-09-27 DIAGNOSIS — H35719 Central serous chorioretinopathy, unspecified eye: Secondary | ICD-10-CM

## 2011-09-27 DIAGNOSIS — E11319 Type 2 diabetes mellitus with unspecified diabetic retinopathy without macular edema: Secondary | ICD-10-CM

## 2011-09-27 DIAGNOSIS — H2510 Age-related nuclear cataract, unspecified eye: Secondary | ICD-10-CM

## 2011-09-27 DIAGNOSIS — E1139 Type 2 diabetes mellitus with other diabetic ophthalmic complication: Secondary | ICD-10-CM

## 2011-10-13 ENCOUNTER — Other Ambulatory Visit: Payer: Self-pay | Admitting: Internal Medicine

## 2011-10-13 ENCOUNTER — Ambulatory Visit: Payer: 59 | Admitting: Endocrinology

## 2011-10-15 ENCOUNTER — Other Ambulatory Visit: Payer: Self-pay | Admitting: *Deleted

## 2011-10-15 MED ORDER — LEVOTHYROXINE SODIUM 175 MCG PO TABS: 175.0000 ug | ORAL_TABLET | Freq: Every day | ORAL | Status: DC

## 2011-10-25 ENCOUNTER — Other Ambulatory Visit: Payer: Self-pay | Admitting: *Deleted

## 2011-10-25 MED ORDER — INSULIN LISPRO 100 UNIT/ML ~~LOC~~ SOLN: SUBCUTANEOUS | Status: DC

## 2011-10-25 NOTE — Telephone Encounter (Signed)
Pt needs refill of Humalog sent to [facility]-he is almost out of samples.

## 2011-10-27 ENCOUNTER — Emergency Department (HOSPITAL_COMMUNITY)
Admission: EM | Admit: 2011-10-27 | Discharge: 2011-10-27 | Disposition: A | Discharge status: Home / Self Care | Payer: Worker's Compensation | Attending: Emergency Medicine | Admitting: Emergency Medicine

## 2011-10-27 ENCOUNTER — Encounter (HOSPITAL_COMMUNITY): Payer: Self-pay | Admitting: Emergency Medicine

## 2011-10-27 DIAGNOSIS — Z888 Allergy status to other drugs, medicaments and biological substances status: Secondary | ICD-10-CM | POA: Insufficient documentation

## 2011-10-27 DIAGNOSIS — T63461A Toxic effect of venom of wasps, accidental (unintentional), initial encounter: Secondary | ICD-10-CM | POA: Insufficient documentation

## 2011-10-27 DIAGNOSIS — H3530 Unspecified macular degeneration: Secondary | ICD-10-CM | POA: Insufficient documentation

## 2011-10-27 DIAGNOSIS — E785 Hyperlipidemia, unspecified: Secondary | ICD-10-CM | POA: Insufficient documentation

## 2011-10-27 DIAGNOSIS — Z79899 Other long term (current) drug therapy: Secondary | ICD-10-CM | POA: Insufficient documentation

## 2011-10-27 DIAGNOSIS — T63441A Toxic effect of venom of bees, accidental (unintentional), initial encounter: Secondary | ICD-10-CM

## 2011-10-27 DIAGNOSIS — I2510 Atherosclerotic heart disease of native coronary artery without angina pectoris: Secondary | ICD-10-CM | POA: Insufficient documentation

## 2011-10-27 DIAGNOSIS — T6391XA Toxic effect of contact with unspecified venomous animal, accidental (unintentional), initial encounter: Secondary | ICD-10-CM | POA: Insufficient documentation

## 2011-10-27 DIAGNOSIS — F17200 Nicotine dependence, unspecified, uncomplicated: Secondary | ICD-10-CM | POA: Insufficient documentation

## 2011-10-27 DIAGNOSIS — I10 Essential (primary) hypertension: Secondary | ICD-10-CM | POA: Insufficient documentation

## 2011-10-27 DIAGNOSIS — E119 Type 2 diabetes mellitus without complications: Secondary | ICD-10-CM | POA: Insufficient documentation

## 2011-10-27 DIAGNOSIS — Z794 Long term (current) use of insulin: Secondary | ICD-10-CM | POA: Insufficient documentation

## 2011-10-27 DIAGNOSIS — E039 Hypothyroidism, unspecified: Secondary | ICD-10-CM | POA: Insufficient documentation

## 2011-10-27 DIAGNOSIS — Z7982 Long term (current) use of aspirin: Secondary | ICD-10-CM | POA: Insufficient documentation

## 2011-10-27 MED ORDER — EPINEPHRINE 0.3 MG/0.3ML IJ DEVI: 0.3000 mg | INTRAMUSCULAR | Status: DC | PRN

## 2011-10-27 MED ORDER — ACETAMINOPHEN 325 MG PO TABS
975.0000 mg | ORAL_TABLET | Freq: Once | ORAL | Status: AC
  Administered 2011-10-27: 975 mg via ORAL
  Filled 2011-10-27: qty 3

## 2011-10-27 MED ORDER — ACETAMINOPHEN 500 MG PO TABS
1000.0000 mg | ORAL_TABLET | Freq: Once | ORAL | Status: DC
  Filled 2011-10-27: qty 2

## 2011-10-27 MED ORDER — DIPHENHYDRAMINE HCL 25 MG PO CAPS
50.0000 mg | ORAL_CAPSULE | Freq: Once | ORAL | Status: AC
  Administered 2011-10-27: 50 mg via ORAL
  Filled 2011-10-27: qty 2

## 2011-10-27 NOTE — ED Notes (Signed)
Pt with known bee allergy stung on right arm; pt self administered epi pen and now having generalized itching

## 2011-10-27 NOTE — ED Notes (Signed)
MD at bedside. 

## 2011-10-27 NOTE — ED Provider Notes (Addendum)
History     CSN: [id number]  Arrival date & time 10/27/11  1115   First MD Initiated Contact with Patient 10/27/11 1132      Chief Complaint  Patient presents with  . Insect Bite  . Allergic Reaction    (Consider location/radiation/quality/duration/timing/severity/associated sxs/prior treatment) HPI Patient was stung by a bee right forearmone time approximately 15 minutes ago. He complains of itching of his face both arms and both legs and feet onset immediately afterwards. Denies chest pain denies shortness of breath denies swelling of throat lips or tongue no voice change no other complaint no other associated symptoms. Treated himself with an EpiPen prior to coming here Past Medical History  Diagnosis Date  . CAD (coronary artery disease)   . Type II or unspecified type diabetes mellitus without mention of complication, not stated as uncontrolled   . HTN (hypertension)   . Hypothyroidism   . Allergy to bee sting   . Macular degeneration   . Hyperlipidemia   . Obesity     History reviewed. No pertinent past surgical history.  Family History  Problem Relation Age of Onset  . Hypertension Other   . Diabetes Father   . Cancer Father     stomach  . Diabetes Mother   . Cancer Mother     male ca  . Diabetes Other     History  Substance Use Topics  . Smoking status: Current Everyday Smoker -- 2.0 packs/day  . Smokeless tobacco: Not on file  . Alcohol Use: No      Review of Systems  Constitutional: Negative.   HENT: Negative.   Respiratory: Negative.   Cardiovascular: Negative.   Gastrointestinal: Negative.   Musculoskeletal: Negative.   Skin: Negative.        Itching  Neurological: Negative.   Hematological: Negative.   Psychiatric/Behavioral: Negative.   All other systems reviewed and are negative.    Allergies  Actos and Lisinopril  Home Medications   Current Outpatient Rx  Name Route Sig Dispense Refill  . ASPIRIN 81 MG PO TBEC Oral Take 81  mg by mouth daily.      . ATENOLOL-CHLORTHALIDONE 100-25 MG PO TABS Oral Take 1 tablet by mouth daily. 30 tablet 5  . EPIPEN 2-PAK 0.3 MG/0.3ML IJ DEVI  USE AS DIRECTED FOR BEE STINGS. 2 each 1  . INSULIN LISPRO (HUMAN) 100 UNIT/ML [place] SOLN  Inject into the skin 3 times daily (just before each meal) 20-15-25 units 30 mL 2  . PEN NEEDLES 5/16" 31G X 8 MM MISC Does not apply 1 Device by Does not apply route 3 (three) times daily before meals. 90 each 11  . ONETOUCH ULTRASOFT LANCETS MISC  TEST TWO TIMES A DAY 100 each 5  . LEVOTHYROXINE SODIUM 175 MCG PO TABS Oral Take 1 tablet (175 mcg total) by mouth daily. 90 tablet 2  . LOSARTAN POTASSIUM 100 MG PO TABS  TAKE 1 TABLET BY MOUTH DAILY. 90 tablet 3  . ONETOUCH ULTRA BLUE VI STRP  TEST TWO TIMES A DAY 100 each 5  . PREDNISONE 10 MG PO TABS Oral Take 6 tablets (60 mg total) by mouth as needed. 30 tablet 1  . SIMVASTATIN 40 MG PO TABS  TAKE 1 TABLET BY MOUTH AT BEDTIME. 90 tablet 2  . VARENICLINE TARTRATE 1 MG PO TABS Oral Take 1 mg by mouth 2 (two) times daily.        BP 145/84  Pulse 68  Temp 98.5 F (  36.9 C) (Oral)  Resp 16  SpO2 100%  Physical Exam  Nursing note and vitals reviewed. Constitutional: He appears well-developed and well-nourished.  HENT:  Head: Normocephalic and atraumatic.  Eyes: Conjunctivae are normal. Pupils are equal, round, and reactive to light.  Neck: Neck supple. No tracheal deviation present. No thyromegaly present.  Cardiovascular: Normal rate and regular rhythm.   No murmur heard. Pulmonary/Chest: Effort normal and breath sounds normal.  Abdominal: Soft. Bowel sounds are normal. He exhibits no distension. There is no tenderness.  Musculoskeletal: Normal range of motion. He exhibits no edema and no tenderness.  Neurological: He is alert. Coordination normal.  Skin: Skin is warm and dry. No rash noted.  Psychiatric: He has a normal mood and affect.    ED Course  Procedures (including critical care  time)  Labs Reviewed - No data to display No results found. 4:30 PM patient is alert Glasgow Coma Score 15 asymptomatic after treatment with Benadryl. He is not lightheaded on standing.  No diagnosis found.  Date: 11/30/2011  Rate: 70  Rhythm: normal sinus rhythm  QRS Axis: left  Intervals: normal  ST/T Wave abnormalities: nonspecific T wave changes  Conduction Disutrbances:none  Narrative Interpretation:   Old EKG Reviewed: none available    MDM    Plan prescription EpiPen Diagnosis allergic reaction to bee sting     Doug Sou, MD 10/27/11 1539  Doug Sou, MD 11/30/11 4540

## 2011-10-27 NOTE — ED Notes (Addendum)
Verbal order given by nurse to do ekg RN/Ginnie. (witnessed patient reporting "chest tightness" to rn-ginnie.

## 2011-10-27 NOTE — ED Notes (Signed)
Co-worker at bedside. 

## 2011-10-27 NOTE — ED Notes (Signed)
Family at bedside. 

## 2011-11-11 ENCOUNTER — Ambulatory Visit (INDEPENDENT_AMBULATORY_CARE_PROVIDER_SITE_OTHER): Payer: 59 | Admitting: Endocrinology

## 2011-11-11 ENCOUNTER — Encounter: Payer: Self-pay | Admitting: Endocrinology

## 2011-11-11 VITALS — BP 112/62 | HR 55 | Temp 98.2°F | Wt 240.0 lb

## 2011-11-11 DIAGNOSIS — E119 Type 2 diabetes mellitus without complications: Secondary | ICD-10-CM

## 2011-11-11 MED ORDER — INSULIN LISPRO 100 UNIT/ML ~~LOC~~ SOLN: SUBCUTANEOUS | Status: DC

## 2011-11-11 NOTE — Progress Notes (Signed)
Subjective:    Patient ID: Anthony Woods, male    DOB: November 16, 1955, 56 y.o.   MRN: [id number]  HPI Pt returns for f/u of IDDM (dx'ed 1998, complicated by CAD).  no cbg record, but states cbg's are highest at hs.  It varies from 120-400.  It is in general higher as the day goes on.  pt states he feels well in general, except for fatigue. Past Medical History  Diagnosis Date  . CAD (coronary artery disease)   . Type II or unspecified type diabetes mellitus without mention of complication, not stated as uncontrolled   . HTN (hypertension)   . Hypothyroidism   . Allergy to bee sting   . Macular degeneration   . Hyperlipidemia   . Obesity     No past surgical history on file.  History   Social History  . Marital Status: Married    Spouse Name: N/A    Number of Children: N/A  . Years of Education: N/A   Occupational History  . [employer]    Social History Main Topics  . Smoking status: Current Everyday Smoker -- 2.0 packs/day  . Smokeless tobacco: Not on file  . Alcohol Use: No  . Drug Use: No  . Sexually Active: Yes   Other Topics Concern  . Not on file   Social History Narrative  . No narrative on file    Current Outpatient Prescriptions on File Prior to Visit  Medication Sig Dispense Refill  . aspirin 81 MG EC tablet Take 81 mg by mouth daily.        Marland Kitchen atenolol-chlorthalidone (TENORETIC) 100-25 MG per tablet Take 1 tablet by mouth daily.  30 tablet  5  . EPINEPHrine (EPIPEN) 0.3 mg/0.3 mL DEVI Inject 0.3 mLs (0.3 mg total) into the muscle as needed.  1 Device  0  . EPIPEN 2-PAK 0.3 MG/0.3ML DEVI USE AS DIRECTED FOR BEE STINGS.  2 each  1  . Insulin Pen Needle (PEN NEEDLES 31GX5/16") 31G X 8 MM MISC 1 Device by Does not apply route 3 (three) times daily before meals.  90 each  11  . Lancets (ONETOUCH ULTRASOFT) lancets TEST TWO TIMES A DAY  100 each  5  . levothyroxine (SYNTHROID, LEVOTHROID) 175 MCG tablet Take 1 tablet (175 mcg total) by mouth daily.  90 tablet  2    . losartan (COZAAR) 100 MG tablet TAKE 1 TABLET BY MOUTH DAILY.  90 tablet  3  . ONE TOUCH ULTRA TEST test strip TEST TWO TIMES A DAY  100 each  5  . simvastatin (ZOCOR) 40 MG tablet TAKE 1 TABLET BY MOUTH AT BEDTIME.  90 tablet  2    Allergies  Allergen Reactions  . Actos (Pioglitazone Hydrochloride)     swelling  . Bee Venom Itching  . Lisinopril     cough    Family History  Problem Relation Age of Onset  . Hypertension Other   . Diabetes Father   . Cancer Father     stomach  . Diabetes Mother   . Cancer Mother     male ca  . Diabetes Other     BP 112/62  Pulse 55  Temp 98.2 F (36.8 C) (Oral)  Wt 240 lb (108.863 kg)  SpO2 94%   Review of Systems denies hypoglycemia.    Objective:   Physical Exam Pulses: dorsalis pedis intact bilat.   Feet: no deformity.  no ulcer on the feet.  feet are  of normal color and temp.  no edema Neuro: sensation is intact to touch on the feet.      Assessment & Plan:  DM: needs increased rx

## 2011-11-11 NOTE — Patient Instructions (Addendum)
check your blood sugar 2 times a day.  vary the time of day when you check, between before the 3 meals, and at bedtime.  also check if you have symptoms of your blood sugar being too high or too low.  please keep a record of the readings and bring it to your next appointment here.  please call us sooner if your blood sugar goes below 70, or if you have a lot of readings over 200. Please come back for a follow-up appointment in 1 month.   Increase humalog to 3x a day (just before each meal) 30-25-35 units.

## 2011-12-16 ENCOUNTER — Ambulatory Visit: Payer: Self-pay | Admitting: Endocrinology

## 2011-12-16 ENCOUNTER — Ambulatory Visit: Payer: 59 | Admitting: Internal Medicine

## 2011-12-29 ENCOUNTER — Other Ambulatory Visit: Payer: Self-pay | Admitting: Internal Medicine

## 2012-01-06 ENCOUNTER — Ambulatory Visit (INDEPENDENT_AMBULATORY_CARE_PROVIDER_SITE_OTHER): Payer: 59 | Admitting: Endocrinology

## 2012-01-06 ENCOUNTER — Other Ambulatory Visit (INDEPENDENT_AMBULATORY_CARE_PROVIDER_SITE_OTHER): Payer: 59

## 2012-01-06 ENCOUNTER — Ambulatory Visit (INDEPENDENT_AMBULATORY_CARE_PROVIDER_SITE_OTHER): Payer: 59 | Admitting: Internal Medicine

## 2012-01-06 ENCOUNTER — Encounter: Payer: Self-pay | Admitting: Endocrinology

## 2012-01-06 ENCOUNTER — Encounter: Payer: Self-pay | Admitting: Internal Medicine

## 2012-01-06 VITALS — BP 110/66 | HR 54 | Temp 97.9°F | Resp 16 | Wt 251.2 lb

## 2012-01-06 VITALS — BP 110/70 | HR 76 | Temp 98.1°F | Resp 16 | Wt 252.0 lb

## 2012-01-06 DIAGNOSIS — I10 Essential (primary) hypertension: Secondary | ICD-10-CM

## 2012-01-06 DIAGNOSIS — T6391XA Toxic effect of contact with unspecified venomous animal, accidental (unintentional), initial encounter: Secondary | ICD-10-CM

## 2012-01-06 DIAGNOSIS — Z23 Encounter for immunization: Secondary | ICD-10-CM

## 2012-01-06 DIAGNOSIS — E119 Type 2 diabetes mellitus without complications: Secondary | ICD-10-CM

## 2012-01-06 DIAGNOSIS — I2510 Atherosclerotic heart disease of native coronary artery without angina pectoris: Secondary | ICD-10-CM

## 2012-01-06 DIAGNOSIS — E039 Hypothyroidism, unspecified: Secondary | ICD-10-CM

## 2012-01-06 LAB — BASIC METABOLIC PANEL
BUN: 13 mg/dL (ref 6–23)
Chloride: 98 mEq/L (ref 96–112)
Creatinine, Ser: 1 mg/dL (ref 0.4–1.5)
Glucose, Bld: 174 mg/dL — ABNORMAL HIGH (ref 70–99)
Potassium: 3.6 mEq/L (ref 3.5–5.1)

## 2012-01-06 LAB — LIPID PANEL
LDL Cholesterol: 114 mg/dL — ABNORMAL HIGH (ref 0–99)
VLDL: 24.4 mg/dL (ref 0.0–40.0)

## 2012-01-06 LAB — HEMOGLOBIN A1C: Hgb A1c MFr Bld: 10.3 % — ABNORMAL HIGH (ref 4.6–6.5)

## 2012-01-06 LAB — HEPATIC FUNCTION PANEL: Total Bilirubin: 0.4 mg/dL (ref 0.3–1.2)

## 2012-01-06 MED ORDER — INSULIN LISPRO 100 UNIT/ML ~~LOC~~ SOLN: SUBCUTANEOUS | Status: DC

## 2012-01-06 NOTE — Assessment & Plan Note (Signed)
Continue with current prescription therapy as reflected on the Med list.  

## 2012-01-06 NOTE — Assessment & Plan Note (Signed)
Wt Readings from Last 3 Encounters:  01/06/12 252 lb (114.306 kg)  11/11/11 240 lb (108.863 kg)  09/13/11 237 lb (107.502 kg)

## 2012-01-06 NOTE — Progress Notes (Signed)
Subjective:    Patient ID: Anthony Woods, male    DOB: [birth year], 56 y.o.   MRN: [id number]  HPI Pt returns for f/u of IDDM (dx'ed 1998, complicated by CAD).  no cbg record, but states cbg's are highest at hs.  It varies from 98-200's.  It is in general lowest in the afternoon, and highest in am (he says it may be high at hs, also).  Fatigue is slightly better. Past Medical History  Diagnosis Date  . CAD (coronary artery disease)   . Type II or unspecified type diabetes mellitus without mention of complication, not stated as uncontrolled   . HTN (hypertension)   . Hypothyroidism   . Allergy to bee sting   . Macular degeneration   . Hyperlipidemia   . Obesity     No past surgical history on file.  History   Social History  . Marital Status: Married    Spouse Name: N/A    Number of Children: N/A  . Years of Education: N/A   Occupational History  . [employer]    Social History Main Topics  . Smoking status: Current Every Day Smoker -- 2.0 packs/day  . Smokeless tobacco: Not on file  . Alcohol Use: No  . Drug Use: No  . Sexually Active: Yes   Other Topics Concern  . Not on file   Social History Narrative  . No narrative on file    Current Outpatient Prescriptions on File Prior to Visit  Medication Sig Dispense Refill  . aspirin 81 MG EC tablet Take 81 mg by mouth daily.        Marland Kitchen atenolol-chlorthalidone (TENORETIC) 100-25 MG per tablet TAKE 1 TABLET BY MOUTH DAILY.  30 tablet  5  . EPIPEN 2-PAK 0.3 MG/0.3ML DEVI USE AS DIRECTED FOR BEE STINGS.  2 each  1  . Insulin Pen Needle (PEN NEEDLES 31GX5/16") 31G X 8 MM MISC 1 Device by Does not apply route 3 (three) times daily before meals.  90 each  11  . Lancets (ONETOUCH ULTRASOFT) lancets TEST TWO TIMES A DAY  100 each  5  . levothyroxine (SYNTHROID, LEVOTHROID) 175 MCG tablet Take 1 tablet (175 mcg total) by mouth daily.  90 tablet  2  . losartan (COZAAR) 100 MG tablet TAKE 1 TABLET BY MOUTH DAILY.  90 tablet  3  .  ONE TOUCH ULTRA TEST test strip TEST TWO TIMES A DAY  100 each  5  . simvastatin (ZOCOR) 40 MG tablet TAKE 1 TABLET BY MOUTH AT BEDTIME.  90 tablet  2    Allergies  Allergen Reactions  . Actos (Pioglitazone Hydrochloride)     swelling  . Bee Venom Itching  . Lisinopril     cough    Family History  Problem Relation Age of Onset  . Hypertension Other   . Diabetes Father   . Cancer Father     stomach  . Diabetes Mother   . Cancer Mother     male ca  . Diabetes Other    BP 110/66  Pulse 54  Temp 97.9 F (36.6 C) (Oral)  Resp 16  Wt 251 lb 3 oz (113.938 kg)  SpO2 94%   Review of Systems denies hypoglycemia    Objective:   Physical Exam VITAL SIGNS:  See vs page GENERAL: no distress PSYCH: Alert and oriented x 3.  Does not appear anxious nor depressed.     Assessment & Plan:  DM, needs increased rx

## 2012-01-06 NOTE — Assessment & Plan Note (Signed)
Chronic, not well controlled - better He has been using insulin  F/u w/Dr Everardo All

## 2012-01-06 NOTE — Assessment & Plan Note (Signed)
Continue with current prescription therapy as reflected on the Med list. Card cons Dr Myrtis Ser

## 2012-01-06 NOTE — Patient Instructions (Addendum)
check your blood sugar 2 times a day.  vary the time of day when you check, between before the 3 meals, and at bedtime.  also check if you have symptoms of your blood sugar being too high or too low.  please keep a record of the readings and bring it to your next appointment here.  please call us sooner if your blood sugar goes below 70, or if you have a lot of readings over 200.  For now, concentrate on comparing the bedtime and morning blood sugar. Please come back for a follow-up appointment in 1 month.   Increase humalog to 3x a day (just before each meal) 35-25-45 units.

## 2012-01-06 NOTE — Progress Notes (Signed)
   Subjective:    Patient ID: Anthony Woods, male    DOB: 1955-04-01, 56 y.o.   MRN: [id number]  HPI  The patient presents for a follow-up of  chronic hypertension, chronic dyslipidemia, type 2 diabetes better controlled with medicines/insulin; wt gain, CAD  He cont to smoke  Wt Readings from Last 3 Encounters:  01/06/12 252 lb (114.306 kg)  11/11/11 240 lb (108.863 kg)  09/13/11 237 lb (107.502 kg)   BP Readings from Last 3 Encounters:  01/06/12 110/70  11/11/11 112/62  10/27/11 102/69      Review of Systems  Constitutional: Negative for appetite change, fatigue and unexpected weight change.  HENT: Negative for nosebleeds, congestion, sore throat, sneezing, trouble swallowing and neck pain.   Eyes: Negative for itching and visual disturbance.  Respiratory: Negative for cough.   Cardiovascular: Negative for chest pain, palpitations and leg swelling.  Gastrointestinal: Negative for nausea, diarrhea, blood in stool and abdominal distention.  Genitourinary: Negative for frequency and hematuria.  Musculoskeletal: Negative for back pain, joint swelling and gait problem.  Skin: Negative for rash.  Neurological: Negative for dizziness, tremors, speech difficulty and weakness.  Psychiatric/Behavioral: Negative for disturbed wake/sleep cycle, dysphoric mood and agitation. The patient is not nervous/anxious.        Objective:   Physical Exam  Vitals reviewed. Constitutional: He is oriented to person, place, and time. He appears well-developed.       obese  HENT:  Mouth/Throat: Oropharynx is clear and moist.  Eyes: Conjunctivae normal are normal. Pupils are equal, round, and reactive to light.  Neck: Normal range of motion. No JVD present. No thyromegaly present.  Cardiovascular: Normal rate, regular rhythm, normal heart sounds and intact distal pulses.  Exam reveals no gallop and no friction rub.   No murmur heard. Pulmonary/Chest: Effort normal and breath sounds normal. No  respiratory distress. He has no wheezes. He has no rales. He exhibits no tenderness.  Abdominal: Soft. Bowel sounds are normal. He exhibits no distension and no mass. There is no tenderness. There is no rebound and no guarding.  Musculoskeletal: Normal range of motion. He exhibits no edema and no tenderness.  Lymphadenopathy:    He has no cervical adenopathy.  Neurological: He is alert and oriented to person, place, and time. He has normal reflexes. No cranial nerve deficit. He exhibits normal muscle tone. Coordination normal.  Skin: Skin is warm and dry. No rash noted.  Psychiatric: He has a normal mood and affect. His behavior is normal. Judgment and thought content normal.    Lab Results  Component Value Date   WBC 10.4 06/08/2011   HGB 15.8 06/08/2011   HCT 47.1 06/08/2011   PLT 192.0 06/08/2011   GLUCOSE 271* 09/13/2011   CHOL 171 06/08/2011   TRIG 405.0* 06/08/2011   HDL 35.30* 06/08/2011   LDLDIRECT 90.6 06/08/2011   LDLCALC 90 06/05/2009   ALT 29 06/08/2011   AST 21 06/08/2011   NA 137 09/13/2011   K 4.2 09/13/2011   CL 98 09/13/2011   CREATININE 1.1 09/13/2011   BUN 12 09/13/2011   CO2 32 09/13/2011   TSH 0.55 06/08/2011   PSA 0.30 06/08/2011   HGBA1C 12.0* 09/13/2011         Assessment & Plan:

## 2012-01-06 NOTE — Assessment & Plan Note (Signed)
Severe reaction He has a kit of Rx prn 

## 2012-01-07 ENCOUNTER — Telehealth: Payer: Self-pay | Admitting: Internal Medicine

## 2012-01-07 NOTE — Telephone Encounter (Signed)
[name], please, inform patient that his DM control is better but is still far from desired - pls cont w/Dr Ellison's recommendations  Thx

## 2012-01-09 ENCOUNTER — Encounter: Payer: Self-pay | Admitting: Internal Medicine

## 2012-01-11 NOTE — Telephone Encounter (Signed)
Pt informed

## 2012-02-01 ENCOUNTER — Encounter: Payer: Self-pay | Admitting: *Deleted

## 2012-02-01 ENCOUNTER — Ambulatory Visit (INDEPENDENT_AMBULATORY_CARE_PROVIDER_SITE_OTHER): Payer: 59 | Admitting: Cardiovascular Disease

## 2012-02-01 ENCOUNTER — Encounter: Payer: Self-pay | Admitting: Cardiovascular Disease

## 2012-02-01 VITALS — BP 125/66 | HR 56 | Ht 65.0 in | Wt 263.0 lb

## 2012-02-01 DIAGNOSIS — Z720 Tobacco use: Secondary | ICD-10-CM

## 2012-02-01 DIAGNOSIS — I2510 Atherosclerotic heart disease of native coronary artery without angina pectoris: Secondary | ICD-10-CM

## 2012-02-01 DIAGNOSIS — F17200 Nicotine dependence, unspecified, uncomplicated: Secondary | ICD-10-CM

## 2012-02-01 DIAGNOSIS — R079 Chest pain, unspecified: Secondary | ICD-10-CM

## 2012-02-01 NOTE — Progress Notes (Signed)
History of Present Illness: 56 yo male with history of mild CAD by cath 2003, DM, HTN, HLD, obesity, ongoing tobacco abuse referred today for evaluation of chest pain. He tells me that he has been having tightness in his chest with minimal exertion. 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 His chest pain is described as tightness with exertion, center of his chest with associated SOB.   Primary Care Physician: Plotnikov  Last Lipid Profile:Lipid Panel     Component Value Date/Time   CHOL 183 01/06/2012 0855   TRIG 122.0 01/06/2012 0855   HDL 44.40 01/06/2012 0855   CHOLHDL 4 01/06/2012 0855   VLDL 24.4 01/06/2012 0855   LDLCALC 114* 01/06/2012 0855     Past Medical History  Diagnosis Date  . CAD (coronary artery disease)   . Type II or unspecified type diabetes mellitus without mention of complication, not stated as uncontrolled   . HTN (hypertension)   . Hypothyroidism   . Allergy to bee sting   . Macular degeneration   . Hyperlipidemia   . Obesity     Past Surgical History  Procedure Date  . Knee surgery     Meniscus removal    Current Outpatient Prescriptions  Medication Sig Dispense Refill  . aspirin 81 MG EC tablet Take 81 mg by mouth daily.        Marland Kitchen atenolol-chlorthalidone (TENORETIC) 100-25 MG per tablet TAKE 1 TABLET BY MOUTH DAILY.  30 tablet  5  . EPIPEN 2-PAK 0.3 MG/0.3ML DEVI USE AS DIRECTED FOR BEE STINGS.  2 each  1  . insulin lispro (HUMALOG) 100 UNIT/ML injection Inject into the skin 3 times daily (just before each meal) 35-25-45 units, and pen needles 3/day  45 mL  11  . Insulin Pen Needle (PEN NEEDLES 31GX5/16") 31G X 8 MM MISC 1 Device by Does not apply route 3 (three) times daily before meals.  90 each  11  . Lancets (ONETOUCH ULTRASOFT) lancets TEST TWO TIMES A DAY  100 each  5  . levothyroxine (SYNTHROID, LEVOTHROID) 175 MCG tablet Take 1 tablet (175 mcg total) by mouth daily.  90 tablet  2  . losartan (COZAAR) 100 MG tablet TAKE 1  TABLET BY MOUTH DAILY.  90 tablet  3  . ONE TOUCH ULTRA TEST test strip TEST TWO TIMES A DAY  100 each  5  . prednisoLONE 5 MG TABS Take by mouth. Pt only takes prednisone when he gets bee stings      . simvastatin (ZOCOR) 40 MG tablet TAKE 1 TABLET BY MOUTH AT BEDTIME.  90 tablet  2    Allergies  Allergen Reactions  . Actos (Pioglitazone Hydrochloride)     swelling  . Bee Venom Itching  . Lisinopril     cough    History   Social History  . Marital Status: Married    Spouse Name: N/A    Number of Children: 1  . Years of Education: N/A   Occupational History  . [employer]   . 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 Smoking status: Current Every Day Smoker -- 2.0 packs/day for 40 years    Types: Cigarettes  . Smokeless tobacco: Not on file  . Alcohol Use: No  . Drug Use: No  . Sexually Active: Yes   Other Topics Concern  . Not on file   Social History Narrative  . No narrative on file  Family History  Problem Relation Age of Onset  . Hypertension Other   . Diabetes Father   . Cancer Father     stomach  . Diabetes Mother   . Cancer Mother     male ca  . Diabetes Other   . CVA Father   . CAD Maternal Grandmother     Review of Systems:  As stated in the HPI and otherwise negative.   BP 125/66  Pulse 56  Ht 5\' 5"  (1.651 m)  Wt 263 lb (119.296 kg)  BMI 43.77 kg/m2  Physical Examination: General: Well developed, well nourished, NAD HEENT: OP clear, mucus membranes moist SKIN: warm, dry. No rashes. Neuro: No focal deficits Musculoskeletal: Muscle strength 5/5 all ext Psychiatric: Mood and affect normal Neck: No JVD, no carotid bruits, no thyromegaly, no lymphadenopathy. Lungs:Clear bilaterally, no wheezes, rhonci, crackles Cardiovascular: Regular rate and rhythm. No murmurs, gallops or rubs. Abdomen:Soft. Bowel sounds present. Non-tender.  Extremities: No lower extremity edema. Pulses are 2 + in the bilateral DP/PT.  EKG: Sinus brady,  rate 56 bpm. LAD. Non-specific T wave changes.   Cardiac cath 10/10/01: Left main has distal minor luminal irregularity.  Left anterior descending artery has very minor luminal irregularities in the  mid vessel. The LAD gives rise to a normal sized diagonal branch.  Left circumflex gives rise to a normal sized OM-1 and a large OM-2. OM-1 in  the midportion has a less than 20% stenosis in a segment of vessel which tends  to kink with systole.  The right coronary artery is a dominant vessel. There are minor luminal  irregularities in the mid vessel. The distal right coronary artery gives rise  to a normal sized posterior descending artery, small first posterolateral  branch, normal second posterolateral branch, and a small third posterolateral  branch.  IMPRESSIONS:  1. Normal left ventricular systolic function.  2. No significant coronary artery disease identified.   Assessment and Plan:   1. CAD:  He is known to have mild CAD by cath 2003 and has continued to smoke 2ppd since then. His other cardiac risk factors include HTN, HLD, DM, obesity. This is concerning for unstable angina. I think a cardiac cath is indicated. I have discussed this with the patient and his wife. Will arrange for 02/04/12 in the main cath lab at [facility]. Risks and benefits reviewed with pt. Pre-cath labs today.   2. Tobacco abuse: Smoking cessation encouraged.   3. HTN: BP well controlled

## 2012-02-01 NOTE — Patient Instructions (Addendum)
Your physician recommends that you schedule a follow-up appointment in: 6 weeks.   Your physician has requested that you have a cardiac catheterization. Cardiac catheterization is used to diagnose and/or treat various heart conditions. Doctors may recommend this procedure for a number of different reasons. The most common reason is to evaluate chest pain. Chest pain can be a symptom of coronary artery disease (CAD), and cardiac catheterization can show whether plaque is narrowing or blocking your heart's arteries. This procedure is also used to evaluate the valves, as well as measure the blood flow and oxygen levels in different parts of your heart. For further information please visit [email or web address] Please follow instruction sheet, as given. Scheduled for November 15,2013.  Coronary Angiography Coronary angiography is an X-ray procedure used to look at the arteries in the heart. In this procedure, a dye is injected through a long, hollow tube (catheter). The catheter is about the size of a piece of cooked spaghetti. The catheter injects a dye into an artery in your groin. X-rays are then taken to show if there is a blockage in the arteries of your heart. BEFORE THE PROCEDURE   Let your caregiver know if you have allergies to shellfish or contrast dye. Also let your caregiver know if you have kidney problems or failure.  Do not eat or drink starting from midnight up to the time of the procedure, or as directed.  You may drink enough water to take your medications the morning of the procedure if you were instructed to do so.  You should be at the hospital or outpatient facility where the procedure is to be done 60 minutes prior to the procedure or as directed. PROCEDURE  You may be given an IV medication to help you relax before the procedure.  You will be prepared for the procedure by washing and shaving the area where the catheter will be inserted. This is usually done in the groin but may  be done in the fold of your arm by your elbow.  A medicine will be given to numb your groin where the catheter will be inserted.  A specially trained doctor will insert the catheter into an artery in your groin. The catheter is guided by using a special type of X-ray (fluoroscopy) to the blood vessel being examined.  A special dye is then injected into the catheter and X-rays are taken. The dye helps to show where any narrowing or blockages are located in the heart arteries. AFTER THE PROCEDURE   After the procedure you will be kept in bed lying flat for several hours. You will be instructed to not bend or cross your legs.  The groin insertion site will be watched and checked frequently.  The pulse in your feet will be checked frequently.  Additional blood tests, X-rays and an EKG may be done.  You may stay in the hospital overnight for observation. SEEK IMMEDIATE MEDICAL CARE IF:   You develop chest pain, shortness of breath, feel faint, or pass out.  There is bleeding, swelling, or drainage from the catheter insertion site.  You develop pain, discoloration, coldness, or severe bruising in the leg or area where the catheter was inserted.  You have a fever. Document Released: 09/12/2002 Document Revised: 05/31/2011 Document Reviewed: 11/01/2007 [facility] Patient Information 2013 [place], [place].

## 2012-02-02 ENCOUNTER — Telehealth: Payer: Self-pay | Admitting: *Deleted

## 2012-02-02 ENCOUNTER — Encounter (HOSPITAL_COMMUNITY): Payer: Self-pay | Admitting: Pharmacy Technician

## 2012-02-02 LAB — CBC WITH DIFFERENTIAL/PLATELET
Basophils Relative: 0.4 % (ref 0.0–3.0)
Eosinophils Relative: 2.3 % (ref 0.0–5.0)
HCT: 47 % (ref 39.0–52.0)
Hemoglobin: 15.6 g/dL (ref 13.0–17.0)
Lymphs Abs: 2.9 10*3/uL (ref 0.7–4.0)
MCV: 95.9 fl (ref 78.0–100.0)
Monocytes Absolute: 0.6 10*3/uL (ref 0.1–1.0)
Neutro Abs: 5.4 10*3/uL (ref 1.4–7.7)
RBC: 4.9 Mil/uL (ref 4.22–5.81)
WBC: 9.1 10*3/uL (ref 4.5–10.5)

## 2012-02-02 LAB — BASIC METABOLIC PANEL
CO2: 32 mEq/L (ref 19–32)
Chloride: 96 mEq/L (ref 96–112)
Creatinine, Ser: 1 mg/dL (ref 0.4–1.5)

## 2012-02-02 LAB — PROTIME-INR: INR: 1 ratio (ref 0.8–1.0)

## 2012-02-02 NOTE — Telephone Encounter (Signed)
Triage received call from lab with results of BMP done yesterday showing potassium of 7.1. Lab thinks this is falsely elevated.  Per lab CBC and PT will not need to be redrawn. I called and spoke with pt and he will come in today for repeat BMP.

## 2012-02-03 NOTE — Telephone Encounter (Signed)
Repeat ok. cdm

## 2012-02-04 ENCOUNTER — Ambulatory Visit (HOSPITAL_COMMUNITY)
Admission: RE | Admit: 2012-02-04 | Discharge: 2012-02-04 | Disposition: A | Discharge status: Home / Self Care | Payer: 59 | Source: Ambulatory Visit | Attending: Cardiovascular Disease | Admitting: Cardiovascular Disease

## 2012-02-04 ENCOUNTER — Encounter (HOSPITAL_COMMUNITY)
Admission: RE | Disposition: A | Discharge status: Home / Self Care | Payer: Self-pay | Source: Ambulatory Visit | Attending: Cardiovascular Disease

## 2012-02-04 DIAGNOSIS — R079 Chest pain, unspecified: Secondary | ICD-10-CM

## 2012-02-04 DIAGNOSIS — I10 Essential (primary) hypertension: Secondary | ICD-10-CM | POA: Insufficient documentation

## 2012-02-04 DIAGNOSIS — R0789 Other chest pain: Secondary | ICD-10-CM | POA: Insufficient documentation

## 2012-02-04 DIAGNOSIS — I2510 Atherosclerotic heart disease of native coronary artery without angina pectoris: Secondary | ICD-10-CM | POA: Insufficient documentation

## 2012-02-04 DIAGNOSIS — E119 Type 2 diabetes mellitus without complications: Secondary | ICD-10-CM | POA: Insufficient documentation

## 2012-02-04 DIAGNOSIS — F17200 Nicotine dependence, unspecified, uncomplicated: Secondary | ICD-10-CM | POA: Insufficient documentation

## 2012-02-04 DIAGNOSIS — E785 Hyperlipidemia, unspecified: Secondary | ICD-10-CM | POA: Insufficient documentation

## 2012-02-04 DIAGNOSIS — E669 Obesity, unspecified: Secondary | ICD-10-CM | POA: Insufficient documentation

## 2012-02-04 HISTORY — PX: LEFT HEART CATHETERIZATION WITH CORONARY ANGIOGRAM: SHX5451

## 2012-02-04 SURGERY — LEFT HEART CATHETERIZATION WITH CORONARY ANGIOGRAM
Anesthesia: LOCAL

## 2012-02-04 MED ORDER — FENTANYL CITRATE 0.05 MG/ML IJ SOLN
INTRAMUSCULAR | Status: AC
  Filled 2012-02-04: qty 2

## 2012-02-04 MED ORDER — DIAZEPAM 5 MG PO TABS
5.0000 mg | ORAL_TABLET | ORAL | Status: AC
  Administered 2012-02-04: 5 mg via ORAL

## 2012-02-04 MED ORDER — SODIUM CHLORIDE 0.9 % IV SOLN: 250.0000 mL | INTRAVENOUS | Status: DC | PRN

## 2012-02-04 MED ORDER — SODIUM CHLORIDE 0.9 % IJ SOLN: 3.0000 mL | Freq: Two times a day (BID) | INTRAMUSCULAR | Status: DC

## 2012-02-04 MED ORDER — ASPIRIN 81 MG PO CHEW
324.0000 mg | CHEWABLE_TABLET | ORAL | Status: AC
  Administered 2012-02-04: 324 mg via ORAL

## 2012-02-04 MED ORDER — SODIUM CHLORIDE 0.9 % IV SOLN: INTRAVENOUS | Status: AC

## 2012-02-04 MED ORDER — SODIUM CHLORIDE 0.9 % IV SOLN
INTRAVENOUS | Status: DC
  Administered 2012-02-04: 06:00:00 via INTRAVENOUS

## 2012-02-04 MED ORDER — SODIUM CHLORIDE 0.9 % IJ SOLN: 3.0000 mL | INTRAMUSCULAR | Status: DC | PRN

## 2012-02-04 MED ORDER — VERAPAMIL HCL 2.5 MG/ML IV SOLN
INTRAVENOUS | Status: AC
  Filled 2012-02-04: qty 2

## 2012-02-04 MED ORDER — NITROGLYCERIN 0.2 MG/ML ON CALL CATH LAB
INTRAVENOUS | Status: AC
  Filled 2012-02-04: qty 1

## 2012-02-04 MED ORDER — HEPARIN SODIUM (PORCINE) 1000 UNIT/ML IJ SOLN
INTRAMUSCULAR | Status: AC
  Filled 2012-02-04: qty 1

## 2012-02-04 MED ORDER — ONDANSETRON HCL 4 MG/2ML IJ SOLN: 4.0000 mg | Freq: Four times a day (QID) | INTRAMUSCULAR | Status: DC | PRN

## 2012-02-04 MED ORDER — LIDOCAINE HCL (PF) 1 % IJ SOLN
INTRAMUSCULAR | Status: AC
  Filled 2012-02-04: qty 30

## 2012-02-04 MED ORDER — MIDAZOLAM HCL 2 MG/2ML IJ SOLN
INTRAMUSCULAR | Status: AC
  Filled 2012-02-04: qty 2

## 2012-02-04 MED ORDER — DIAZEPAM 5 MG PO TABS
ORAL_TABLET | ORAL | Status: AC
  Filled 2012-02-04: qty 1

## 2012-02-04 MED ORDER — ACETAMINOPHEN 325 MG PO TABS: 650.0000 mg | ORAL_TABLET | ORAL | Status: DC | PRN

## 2012-02-04 MED ORDER — ASPIRIN 81 MG PO CHEW
CHEWABLE_TABLET | ORAL | Status: AC
  Filled 2012-02-04: qty 4

## 2012-02-04 MED ORDER — HEPARIN (PORCINE) IN NACL 2-0.9 UNIT/ML-% IJ SOLN
INTRAMUSCULAR | Status: AC
  Filled 2012-02-04: qty 1000

## 2012-02-04 NOTE — Interval H&P Note (Signed)
History and Physical Interval Note:  02/04/2012 7:45 AM  Anthony Woods A Brining  has presented today for cardiac cath with the diagnosis of Chest pain  The various methods of treatment have been discussed with the patient and family. After consideration of risks, benefits and other options for treatment, the patient has consented to  Procedure(s) (LRB) with comments: LEFT HEART CATHETERIZATION WITH CORONARY ANGIOGRAM (N/A) as a surgical intervention .  The patient's history has been reviewed, patient examined, no change in status, stable for surgery.  I have reviewed the patient's chart and labs.  Questions were answered to the patient's satisfaction.     MCALHANY,CHRISTOPHER

## 2012-02-04 NOTE — H&P (View-Only) (Signed)
 History of Present Illness: 56 yo male with history of mild CAD by cath 2003, DM, HTN, HLD, obesity, ongoing tobacco abuse referred today for evaluation of chest pain. He tells me that he has been having tightness in his chest with minimal exertion. 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 His chest pain is described as tightness with exertion, center of his chest with associated SOB.   Primary Care Physician: Plotnikov  Last Lipid Profile:Lipid Panel     Component Value Date/Time   CHOL 183 01/06/2012 0855   TRIG 122.0 01/06/2012 0855   HDL 44.40 01/06/2012 0855   CHOLHDL 4 01/06/2012 0855   VLDL 24.4 01/06/2012 0855   LDLCALC 114* 01/06/2012 0855     Past Medical History  Diagnosis Date  . CAD (coronary artery disease)   . Type II or unspecified type diabetes mellitus without mention of complication, not stated as uncontrolled   . HTN (hypertension)   . Hypothyroidism   . Allergy to bee sting   . Macular degeneration   . Hyperlipidemia   . Obesity     Past Surgical History  Procedure Date  . Knee surgery     Meniscus removal    Current Outpatient Prescriptions  Medication Sig Dispense Refill  . aspirin 81 MG EC tablet Take 81 mg by mouth daily.        . atenolol-chlorthalidone (TENORETIC) 100-25 MG per tablet TAKE 1 TABLET BY MOUTH DAILY.  30 tablet  5  . EPIPEN 2-PAK 0.3 MG/0.3ML DEVI USE AS DIRECTED FOR BEE STINGS.  2 each  1  . insulin lispro (HUMALOG) 100 UNIT/ML injection Inject into the skin 3 times daily (just before each meal) 35-25-45 units, and pen needles 3/day  45 mL  11  . Insulin Pen Needle (PEN NEEDLES 31GX5/16") 31G X 8 MM MISC 1 Device by Does not apply route 3 (three) times daily before meals.  90 each  11  . Lancets (ONETOUCH ULTRASOFT) lancets TEST TWO TIMES A DAY  100 each  5  . levothyroxine (SYNTHROID, LEVOTHROID) 175 MCG tablet Take 1 tablet (175 mcg total) by mouth daily.  90 tablet  2  . losartan (COZAAR) 100 MG tablet TAKE 1  TABLET BY MOUTH DAILY.  90 tablet  3  . ONE TOUCH ULTRA TEST test strip TEST TWO TIMES A DAY  100 each  5  . prednisoLONE 5 MG TABS Take by mouth. Pt only takes prednisone when he gets bee stings      . simvastatin (ZOCOR) 40 MG tablet TAKE 1 TABLET BY MOUTH AT BEDTIME.  90 tablet  2    Allergies  Allergen Reactions  . Actos (Pioglitazone Hydrochloride)     swelling  . Bee Venom Itching  . Lisinopril     cough    History   Social History  . Marital Status: Married    Spouse Name: N/A    Number of Children: 1  . Years of Education: N/A   Occupational History  . [employer]   . 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 Smoking status: Current Every Day Smoker -- 2.0 packs/day for 40 years    Types: Cigarettes  . Smokeless tobacco: Not on file  . Alcohol Use: No  . Drug Use: No  . Sexually Active: Yes   Other Topics Concern  . Not on file   Social History Narrative  . No narrative on file      Family History  Problem Relation Age of Onset  . Hypertension Other   . Diabetes Father   . Cancer Father     stomach  . Diabetes Mother   . Cancer Mother     male ca  . Diabetes Other   . CVA Father   . CAD Maternal Grandmother     Review of Systems:  As stated in the HPI and otherwise negative.   BP 125/66  Pulse 56  Ht 5' 5" (1.651 m)  Wt 263 lb (119.296 kg)  BMI 43.77 kg/m2  Physical Examination: General: Well developed, well nourished, NAD HEENT: OP clear, mucus membranes moist SKIN: warm, dry. No rashes. Neuro: No focal deficits Musculoskeletal: Muscle strength 5/5 all ext Psychiatric: Mood and affect normal Neck: No JVD, no carotid bruits, no thyromegaly, no lymphadenopathy. Lungs:Clear bilaterally, no wheezes, rhonci, crackles Cardiovascular: Regular rate and rhythm. No murmurs, gallops or rubs. Abdomen:Soft. Bowel sounds present. Non-tender.  Extremities: No lower extremity edema. Pulses are 2 + in the bilateral DP/PT.  EKG: Sinus brady,  rate 56 bpm. LAD. Non-specific T wave changes.   Cardiac cath 10/10/01: Left main has distal minor luminal irregularity.  Left anterior descending artery has very minor luminal irregularities in the  mid vessel. The LAD gives rise to a normal sized diagonal branch.  Left circumflex gives rise to a normal sized OM-1 and a large OM-2. OM-1 in  the midportion has a less than 20% stenosis in a segment of vessel which tends  to kink with systole.  The right coronary artery is a dominant vessel. There are minor luminal  irregularities in the mid vessel. The distal right coronary artery gives rise  to a normal sized posterior descending artery, small first posterolateral  branch, normal second posterolateral branch, and a small third posterolateral  branch.  IMPRESSIONS:  1. Normal left ventricular systolic function.  2. No significant coronary artery disease identified.   Assessment and Plan:   1. CAD:  He is known to have mild CAD by cath 2003 and has continued to smoke 2ppd since then. His other cardiac risk factors include HTN, HLD, DM, obesity. This is concerning for unstable angina. I think a cardiac cath is indicated. I have discussed this with the patient and his wife. Will arrange for 02/04/12 in the main cath lab at [facility]. Risks and benefits reviewed with pt. Pre-cath labs today.   2. Tobacco abuse: Smoking cessation encouraged.   3. HTN: BP well controlled   

## 2012-02-04 NOTE — CV Procedure (Signed)
    Cardiac Catheterization Operative Report  Anthony Woods [id number] 11/15/20138:13 AM Sonda Primes, MD  Procedure Performed:  1. Left Heart Catheterization 2. Selective Coronary Angiography 3. Left ventricular angiogram  Operator: Verne Carrow, MD  Arterial access site:  Right radial artery.   Indication:   55 yo male with history of mild CAD, HTN, HLD, obesity, tobacco abuse and DM with exertional chest tightness. Cardiac cath to exclude obstructive CAD.                                     Procedure Details: The risks, benefits, complications, treatment options, and expected outcomes were discussed with the patient. The patient and/or family concurred with the proposed plan, giving informed consent. The patient was brought to the cath lab after IV hydration was begun and oral premedication was given. The patient was further sedated with Versed and Fentanyl.  The right wrist was assessed with an Allens test which was positive. The right wrist was prepped and draped in a sterile fashion. 1% lidocaine was used for local anesthesia. Using the modified Seldinger access technique, a 5 French sheath was placed in the right radial artery. 3 mg Verapamil was given through the sheath. 5500 units IV heparin was given. Standard diagnostic catheters were used to perform selective coronary angiography. A pigtail catheter was used to perform a left ventricular angiogram. The sheath was removed from the right radial artery and a Terumo hemostasis band was applied at the arteriotomy site on the right wrist.    There were no immediate complications. The patient was taken to the recovery area in stable condition.   Hemodynamic Findings: Central aortic pressure: 99/58 Left ventricular pressure: 106/13/18  Angiographic Findings:  Left main:  No obstructive disease.   Left Anterior Descending Artery: Large caliber vessel that courses to the apex with no disease noted. The diagonal branch is  moderate sized and has 20% proximal stenosis.   Circumflex Artery: Large caliber vessel with serial 20% stenoses in the proximal and mid AV groove Circumflex. The first obtuse marginal branch is large and has 10% plaque. The AV groove vessel terminates into the second obtuse marginal branch which has no disease.   Right Coronary Artery: Large, dominant vessel with 20% mid stenosis.   Left Ventricular Angiogram: LVEF 60-65%.   Impression: 1. Mild non-obstructive CAD 2. Preserved LV systolic function 3. Non-cardiac chest pain.   Recommendations: Management of mild CAD including statin, ASA. Tobacco cessation advised.        Complications:  None. The patient tolerated the procedure well.

## 2012-02-07 ENCOUNTER — Ambulatory Visit: Payer: Self-pay | Admitting: Endocrinology

## 2012-02-21 ENCOUNTER — Ambulatory Visit (INDEPENDENT_AMBULATORY_CARE_PROVIDER_SITE_OTHER): Payer: 59 | Admitting: Endocrinology

## 2012-02-21 ENCOUNTER — Encounter: Payer: Self-pay | Admitting: Endocrinology

## 2012-02-21 VITALS — BP 122/74 | HR 85 | Temp 98.1°F | Wt 269.0 lb

## 2012-02-21 DIAGNOSIS — E119 Type 2 diabetes mellitus without complications: Secondary | ICD-10-CM

## 2012-02-21 NOTE — Progress Notes (Signed)
Subjective:    Patient ID: Anthony Woods, male    DOB: 04/23/1955, 56 y.o.   MRN: [id number]  HPI Pt returns for f/u of IDDM (dx'ed 1998, complicated by CAD).  no cbg record, but states cbg's are highest at hs.  It varies from 92-200's.  It is in general lowest in the afternoon, and highest in am and at hs.  Fatigue and weight gain persist. Past Medical History  Diagnosis Date  . CAD (coronary artery disease)   . Type II or unspecified type diabetes mellitus without mention of complication, not stated as uncontrolled   . HTN (hypertension)   . Hypothyroidism   . Allergy to bee sting   . Macular degeneration   . Hyperlipidemia   . Obesity     Past Surgical History  Procedure Date  . Knee surgery     Meniscus removal    History   Social History  . Marital Status: Married    Spouse Name: N/A    Number of Children: 1  . Years of Education: N/A   Occupational History  . [employer]   . 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 Smoking status: Current Every Day Smoker -- 2.0 packs/day for 40 years    Types: Cigarettes  . Smokeless tobacco: Not on file  . Alcohol Use: No  . Drug Use: No  . Sexually Active: Yes   Other Topics Concern  . Not on file   Social History Narrative  . No narrative on file    Current Outpatient Prescriptions on File Prior to Visit  Medication Sig Dispense Refill  . aspirin 81 MG EC tablet Take 81 mg by mouth daily.        Marland Kitchen atenolol-chlorthalidone (TENORETIC) 100-25 MG per tablet TAKE 1 TABLET BY MOUTH DAILY.  30 tablet  5  . EPIPEN 2-PAK 0.3 MG/0.3ML DEVI USE AS DIRECTED FOR BEE STINGS.  2 each  1  . Insulin Pen Needle (PEN NEEDLES 31GX5/16") 31G X 8 MM MISC 1 Device by Does not apply route 3 (three) times daily before meals.  90 each  11  . Lancets (ONETOUCH ULTRASOFT) lancets TEST TWO TIMES A DAY  100 each  5  . levothyroxine (SYNTHROID, LEVOTHROID) 175 MCG tablet Take 1 tablet (175 mcg total) by mouth daily.  90 tablet  2  .  losartan (COZAAR) 100 MG tablet Take 100 mg by mouth daily.      . ONE TOUCH ULTRA TEST test strip TEST TWO TIMES A DAY  100 each  5  . predniSONE (DELTASONE) 10 MG tablet Take 60 mg by mouth as needed. For bee stings      . simvastatin (ZOCOR) 40 MG tablet Take 40 mg by mouth daily.        Allergies  Allergen Reactions  . Actos (Pioglitazone Hydrochloride)     swelling  . Bee Venom Itching  . Lisinopril     cough    Family History  Problem Relation Age of Onset  . Hypertension Other   . Diabetes Father   . Cancer Father     stomach  . Diabetes Mother   . Cancer Mother     male ca  . Diabetes Other   . CVA Father   . CAD Maternal Grandmother     BP 122/74  Pulse 85  Temp 98.1 F (36.7 C) (Oral)  Wt 269 lb (122.018 kg)  SpO2 92%    Review  of Systems denies hypoglycemia.      Objective:   Physical Exam VITAL SIGNS:  See vs page GENERAL: no distress PSYCH: Alert and oriented x 3.  Does not appear anxious nor depressed.     Assessment & Plan:  DM: therapy limited by noncompliance with diet and cbg recording.  i'll do the best i can.

## 2012-02-21 NOTE — Patient Instructions (Addendum)
check your blood sugar 2 times a day.  vary the time of day when you check, between before the 3 meals, and at bedtime.  also check if you have symptoms of your blood sugar being too high or too low.  please keep a record of the readings and bring it to your next appointment here.  please call us sooner if your blood sugar goes below 70, or if you have a lot of readings over 200.  For now, concentrate on comparing the bedtime and morning blood sugar. Please come back for a follow-up appointment in 1 month.   Increase humalog to 3x a day (just before each meal) 45-20-55 units.  There is no medical reason to eat a bedtime snack, but if you eat something at bedtime, take 5 units for it.

## 2012-03-27 ENCOUNTER — Ambulatory Visit (INDEPENDENT_AMBULATORY_CARE_PROVIDER_SITE_OTHER): Payer: 59 | Admitting: Endocrinology

## 2012-03-27 ENCOUNTER — Ambulatory Visit (INDEPENDENT_AMBULATORY_CARE_PROVIDER_SITE_OTHER): Payer: 59 | Admitting: Cardiovascular Disease

## 2012-03-27 ENCOUNTER — Ambulatory Visit (INDEPENDENT_AMBULATORY_CARE_PROVIDER_SITE_OTHER)
Admission: RE | Admit: 2012-03-27 | Discharge: 2012-03-27 | Disposition: A | Discharge status: Home / Self Care | Payer: 59 | Source: Ambulatory Visit | Attending: Cardiovascular Disease | Admitting: Cardiovascular Disease

## 2012-03-27 ENCOUNTER — Encounter: Payer: Self-pay | Admitting: Cardiovascular Disease

## 2012-03-27 VITALS — BP 120/70 | HR 65 | Ht 65.0 in | Wt 280.0 lb

## 2012-03-27 VITALS — BP 136/74 | Ht 65.0 in | Wt 280.0 lb

## 2012-03-27 DIAGNOSIS — R0600 Dyspnea, unspecified: Secondary | ICD-10-CM

## 2012-03-27 DIAGNOSIS — I2510 Atherosclerotic heart disease of native coronary artery without angina pectoris: Secondary | ICD-10-CM

## 2012-03-27 DIAGNOSIS — R0989 Other specified symptoms and signs involving the circulatory and respiratory systems: Secondary | ICD-10-CM

## 2012-03-27 DIAGNOSIS — E119 Type 2 diabetes mellitus without complications: Secondary | ICD-10-CM

## 2012-03-27 MED ORDER — FUROSEMIDE 40 MG PO TABS: 40.0000 mg | ORAL_TABLET | Freq: Every day | ORAL | Status: DC

## 2012-03-27 NOTE — Progress Notes (Signed)
Subjective:    Patient ID: Anthony Woods, male    DOB: Aug 29, 1955, 57 y.o.   MRN: [id number]  HPI Pt returns for f/u of IDDM (dx'ed 1998, complicated by CAD).  no cbg record, but states cbg's are highest at hs.  It varies from 92-300's.  It is in general lowest in the afternoon, and still highest in am and at hs.  sxs of fatigue and weight gain are unchanged.   Past Medical History  Diagnosis Date  . CAD (coronary artery disease)   . Type II or unspecified type diabetes mellitus without mention of complication, not stated as uncontrolled   . HTN (hypertension)   . Hypothyroidism   . Allergy to bee sting   . Macular degeneration   . Hyperlipidemia   . Obesity     Past Surgical History  Procedure Date  . Knee surgery     Meniscus removal    History   Social History  . Marital Status: Married    Spouse Name: N/A    Number of Children: 1  . Years of Education: N/A   Occupational History  . [employer]   . 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 Smoking status: Current Every Day Smoker -- 2.0 packs/day for 40 years    Types: Cigarettes  . Smokeless tobacco: Not on file  . Alcohol Use: No  . Drug Use: No  . Sexually Active: Yes   Other Topics Concern  . Not on file   Social History Narrative  . No narrative on file    Current Outpatient Prescriptions on File Prior to Visit  Medication Sig Dispense Refill  . aspirin 81 MG EC tablet Take 81 mg by mouth daily.        Marland Kitchen atenolol-chlorthalidone (TENORETIC) 100-25 MG per tablet TAKE 1 TABLET BY MOUTH DAILY.  30 tablet  5  . EPIPEN 2-PAK 0.3 MG/0.3ML DEVI USE AS DIRECTED FOR BEE STINGS.  2 each  1  . furosemide (LASIX) 40 MG tablet Take 1 tablet (40 mg total) by mouth daily.  30 tablet  11  . insulin lispro (HUMALOG) 100 UNIT/ML injection Inject into the skin 3 times daily (just before each meal) 50-10-75 units, and pen needles 3/day      . Insulin Pen Needle (PEN NEEDLES 31GX5/16") 31G X 8 MM MISC 1 Device by Does  not apply route 3 (three) times daily before meals.  90 each  11  . Lancets (ONETOUCH ULTRASOFT) lancets TEST TWO TIMES A DAY  100 each  5  . levothyroxine (SYNTHROID, LEVOTHROID) 175 MCG tablet Take 1 tablet (175 mcg total) by mouth daily.  90 tablet  2  . losartan (COZAAR) 100 MG tablet Take 100 mg by mouth daily.      . ONE TOUCH ULTRA TEST test strip TEST TWO TIMES A DAY  100 each  5  . predniSONE (DELTASONE) 10 MG tablet Take 60 mg by mouth as needed. For bee stings      . simvastatin (ZOCOR) 40 MG tablet Take 40 mg by mouth daily.        Allergies  Allergen Reactions  . Actos (Pioglitazone Hydrochloride)     swelling  . Bee Venom Itching  . Lisinopril     cough    Family History  Problem Relation Age of Onset  . Hypertension Other   . Diabetes Father   . Cancer Father     stomach  . Diabetes  Mother   . Cancer Mother     male ca  . Diabetes Other   . CVA Father   . CAD Maternal Grandmother     BP 120/70  Pulse 65  Ht 5\' 5"  (1.651 m)  Wt 280 lb (127.007 kg)  BMI 46.59 kg/m2  SpO2 95%    Review of Systems denies hypoglycemia    Objective:   Physical Exam VITAL SIGNS:  See vs page GENERAL: no distress Pulses: dorsalis pedis intact bilat.   Feet: no deformity.  no ulcer on the feet.  feet are of normal color and temp.  1+ bilat leg edema Neuro: sensation is intact to touch on the feet.        Assessment & Plan:  He may need a simpler regimen.  therapy limited by noncompliance with cbg recording.  i'll do the best i can.

## 2012-03-27 NOTE — Patient Instructions (Addendum)
Your physician wants you to follow-up in 12 months.  You will receive a reminder letter in the mail two months in advance. If you don't receive a letter, please call our office to schedule the follow-up appointment.  Your physician has recommended you make the following change in your medication:  Start furosemide 40 mg by mouth daily.  Eat a banana everyday.   Have chest x-ray done at [facility] office on [facility].

## 2012-03-27 NOTE — Patient Instructions (Addendum)
check your blood sugar 2 times a day.  vary the time of day when you check, between before the 3 meals, and at bedtime.  also check if you have symptoms of your blood sugar being too high or too low.  please keep a record of the readings and bring it to your next appointment here.  please call us sooner if your blood sugar goes below 70, or if you have a lot of readings over 200.  Here is a book to write it in.   Please come back for a follow-up appointment in 1 month.   Increase humalog to 3x a day (just before each meal) 50-10-75 units.  There is no medical reason to eat a bedtime snack, but if you eat something at bedtime, take 5 units for it.

## 2012-03-27 NOTE — Progress Notes (Signed)
History of Present Illness: 57 yo male with history of mild CAD by cath 2003, DM, HTN, HLD, obesity, ongoing tobacco abuse here today for cardiac follow up. I saw him as a new patient 02/01/12 for evaluation of chest pain. He told me that he had been having tightness in his chest with minimal exertion. 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 His chest pain was described as tightness with exertion, center of his chest with associated SOB. I arranged a cardiac cath on 02/04/12 and he was found to have mild non-obstructive CAD (see below).   He is here today for follow up. He tells me that he has no changes in his breathing. He is still feeling dyspneic when he exerts himself. No chest pressure. He does have some lower edema, resolves at night. He is still smoking.   Primary Care Physician: Plotnikov  Last Lipid Profile:Lipid Panel     Component Value Date/Time   CHOL 183 01/06/2012 0855   TRIG 122.0 01/06/2012 0855   HDL 44.40 01/06/2012 0855   CHOLHDL 4 01/06/2012 0855   VLDL 24.4 01/06/2012 0855   LDLCALC 114* 01/06/2012 0855     Past Medical History  Diagnosis Date  . CAD (coronary artery disease)   . Type II or unspecified type diabetes mellitus without mention of complication, not stated as uncontrolled   . HTN (hypertension)   . Hypothyroidism   . Allergy to bee sting   . Macular degeneration   . Hyperlipidemia   . Obesity     Past Surgical History  Procedure Date  . Knee surgery     Meniscus removal    Current Outpatient Prescriptions  Medication Sig Dispense Refill  . aspirin 81 MG EC tablet Take 81 mg by mouth daily.        Marland Kitchen atenolol-chlorthalidone (TENORETIC) 100-25 MG per tablet TAKE 1 TABLET BY MOUTH DAILY.  30 tablet  5  . EPIPEN 2-PAK 0.3 MG/0.3ML DEVI USE AS DIRECTED FOR BEE STINGS.  2 each  1  . insulin lispro (HUMALOG) 100 UNIT/ML injection Inject into the skin 3 times daily (just before each meal) 45-20-55 units, and pen needles 3/day        . Insulin Pen Needle (PEN NEEDLES 31GX5/16") 31G X 8 MM MISC 1 Device by Does not apply route 3 (three) times daily before meals.  90 each  11  . Lancets (ONETOUCH ULTRASOFT) lancets TEST TWO TIMES A DAY  100 each  5  . levothyroxine (SYNTHROID, LEVOTHROID) 175 MCG tablet Take 1 tablet (175 mcg total) by mouth daily.  90 tablet  2  . losartan (COZAAR) 100 MG tablet Take 100 mg by mouth daily.      . ONE TOUCH ULTRA TEST test strip TEST TWO TIMES A DAY  100 each  5  . predniSONE (DELTASONE) 10 MG tablet Take 60 mg by mouth as needed. For bee stings      . simvastatin (ZOCOR) 40 MG tablet Take 40 mg by mouth daily.        Allergies  Allergen Reactions  . Actos (Pioglitazone Hydrochloride)     swelling  . Bee Venom Itching  . Lisinopril     cough    History   Social History  . Marital Status: Married    Spouse Name: N/A    Number of Children: 1  . Years of Education: N/A   Occupational History  . [employer]   . GRAVE DIGGER  Social History Main Topics  . Smoking status: Current Every Day Smoker -- 2.0 packs/day for 40 years    Types: Cigarettes  . Smokeless tobacco: Not on file  . Alcohol Use: No  . Drug Use: No  . Sexually Active: Yes   Other Topics Concern  . Not on file   Social History Narrative  . No narrative on file    Family History  Problem Relation Age of Onset  . Hypertension Other   . Diabetes Father   . Cancer Father     stomach  . Diabetes Mother   . Cancer Mother     male ca  . Diabetes Other   . CVA Father   . CAD Maternal Grandmother     Review of Systems:  As stated in the HPI and otherwise negative.   BP 136/74  Ht 5\' 5"  (1.651 m)  Wt 280 lb (127.007 kg)  BMI 46.59 kg/m2  Physical Examination: General: Well developed, well nourished, NAD HEENT: OP clear, mucus membranes moist SKIN: warm, dry. No rashes. Neuro: No focal deficits Musculoskeletal: Muscle strength 5/5 all ext Psychiatric: Mood and affect normal Neck: No JVD,  no carotid bruits, no thyromegaly, no lymphadenopathy. Lungs:Clear bilaterally, no wheezes, rhonci, crackles Cardiovascular: Regular rate and rhythm. No murmurs, gallops or rubs. Abdomen:Soft. Bowel sounds present. Non-tender.  Extremities: Trace bilateral lower extremity edema. Pulses are 2 + in the bilateral DP/PT.  Cardiac cath 02/04/12: Left main: No obstructive disease.  Left Anterior Descending Artery: Large caliber vessel that courses to the apex with no disease noted. The diagonal branch is moderate sized and has 20% proximal stenosis.  Circumflex Artery: Large caliber vessel with serial 20% stenoses in the proximal and mid AV groove Circumflex. The first obtuse marginal branch is large and has 10% plaque. The AV groove vessel terminates into the second obtuse marginal branch which has no disease.  Right Coronary Artery: Large, dominant vessel with 20% mid stenosis.  Left Ventricular Angiogram: LVEF 60-65%.  Impression:  1. Mild non-obstructive CAD  2. Preserved LV systolic function  3. Non-cardiac chest pain.    Assessment and Plan:   1. CAD: Recent cath with minor non-obstructive CAD. Will continue ASA and statin.   2. Dyspnea: He continues to have dyspnea. Likely multi-factorial with obesity, tobacco abuse. He may have a component of diastolic CHF. Will start Lasix 40 mg po Qdaily. He will eat a banana every day while taking Lasix.

## 2012-03-29 ENCOUNTER — Ambulatory Visit (INDEPENDENT_AMBULATORY_CARE_PROVIDER_SITE_OTHER): Payer: 59 | Admitting: Ophthalmology

## 2012-03-29 DIAGNOSIS — H35719 Central serous chorioretinopathy, unspecified eye: Secondary | ICD-10-CM

## 2012-03-29 DIAGNOSIS — H2510 Age-related nuclear cataract, unspecified eye: Secondary | ICD-10-CM

## 2012-03-29 DIAGNOSIS — H43819 Vitreous degeneration, unspecified eye: Secondary | ICD-10-CM

## 2012-03-29 DIAGNOSIS — E119 Type 2 diabetes mellitus without complications: Secondary | ICD-10-CM

## 2012-03-29 DIAGNOSIS — E11319 Type 2 diabetes mellitus with unspecified diabetic retinopathy without macular edema: Secondary | ICD-10-CM

## 2012-03-31 ENCOUNTER — Other Ambulatory Visit: Payer: Self-pay | Admitting: *Deleted

## 2012-03-31 MED ORDER — INSULIN LISPRO 100 UNIT/ML ~~LOC~~ SOLN: SUBCUTANEOUS | Status: DC

## 2012-04-07 ENCOUNTER — Encounter: Payer: Self-pay | Admitting: Internal Medicine

## 2012-04-07 ENCOUNTER — Ambulatory Visit (INDEPENDENT_AMBULATORY_CARE_PROVIDER_SITE_OTHER): Payer: 59 | Admitting: Internal Medicine

## 2012-04-07 VITALS — BP 130/72 | HR 72 | Temp 97.8°F | Resp 16 | Wt 277.0 lb

## 2012-04-07 DIAGNOSIS — F17200 Nicotine dependence, unspecified, uncomplicated: Secondary | ICD-10-CM

## 2012-04-07 DIAGNOSIS — R609 Edema, unspecified: Secondary | ICD-10-CM

## 2012-04-07 DIAGNOSIS — I2510 Atherosclerotic heart disease of native coronary artery without angina pectoris: Secondary | ICD-10-CM

## 2012-04-07 DIAGNOSIS — E119 Type 2 diabetes mellitus without complications: Secondary | ICD-10-CM

## 2012-04-07 MED ORDER — LORCASERIN HCL 10 MG PO TABS: 1.0000 | ORAL_TABLET | Freq: Two times a day (BID) | ORAL | Status: DC

## 2012-04-07 NOTE — Progress Notes (Signed)
   Subjective:  HPI  The patient presents for a follow-up of  chronic hypertension, chronic dyslipidemia, type 2 diabetes better controlled with medicines/insulin; wt gain, CAD  He cont to smoke 1.5 ppd  Wt Readings from Last 3 Encounters:  04/07/12 277 lb (125.646 kg)  03/27/12 280 lb (127.007 kg)  03/27/12 280 lb (127.007 kg)   BP Readings from Last 3 Encounters:  04/07/12 130/72  03/27/12 120/70  03/27/12 136/74      Review of Systems  Constitutional: Negative for appetite change, fatigue and unexpected weight change.  HENT: Negative for nosebleeds, congestion, sore throat, sneezing, trouble swallowing and neck pain.   Eyes: Negative for itching and visual disturbance.  Respiratory: Negative for cough.   Cardiovascular: Negative for chest pain, palpitations and leg swelling.  Gastrointestinal: Negative for nausea, diarrhea, blood in stool and abdominal distention.  Genitourinary: Negative for frequency and hematuria.  Musculoskeletal: Negative for back pain, joint swelling and gait problem.  Skin: Negative for rash.  Neurological: Negative for dizziness, tremors, speech difficulty and weakness.  Psychiatric/Behavioral: Negative for sleep disturbance, dysphoric mood and agitation. The patient is not nervous/anxious.        Objective:   Physical Exam  Vitals reviewed. Constitutional: He is oriented to person, place, and time. He appears well-developed.       obese  HENT:  Mouth/Throat: Oropharynx is clear and moist.  Eyes: Conjunctivae normal are normal. Pupils are equal, round, and reactive to light.  Neck: Normal range of motion. No JVD present. No thyromegaly present.  Cardiovascular: Normal rate, regular rhythm, normal heart sounds and intact distal pulses.  Exam reveals no gallop and no friction rub.   No murmur heard. Pulmonary/Chest: Effort normal and breath sounds normal. No respiratory distress. He has no wheezes. He has no rales. He exhibits no tenderness.    Abdominal: Soft. Bowel sounds are normal. He exhibits no distension and no mass. There is no tenderness. There is no rebound and no guarding.  Musculoskeletal: Normal range of motion. He exhibits no edema and no tenderness.  Lymphadenopathy:    He has no cervical adenopathy.  Neurological: He is alert and oriented to person, place, and time. He has normal reflexes. No cranial nerve deficit. He exhibits normal muscle tone. Coordination normal.  Skin: Skin is warm and dry. No rash noted.  Psychiatric: He has a normal mood and affect. His behavior is normal. Judgment and thought content normal.    Lab Results  Component Value Date   WBC 9.1 02/01/2012   HGB 15.6 02/01/2012   HCT 47.0 02/01/2012   PLT 175.0 02/01/2012   GLUCOSE 142* 02/01/2012   CHOL 183 01/06/2012   TRIG 122.0 01/06/2012   HDL 44.40 01/06/2012   LDLDIRECT 90.6 06/08/2011   LDLCALC 114* 01/06/2012   ALT 19 01/06/2012   AST 20 01/06/2012   NA 136 02/01/2012   K 3.4* 02/01/2012   CL 96 02/01/2012   CREATININE 1.0 02/01/2012   BUN 14 02/01/2012   CO2 32 02/01/2012   TSH 0.55 06/08/2011   PSA 0.30 06/08/2011   INR 1.0 02/01/2012   HGBA1C 10.3* 01/06/2012         Assessment & Plan:

## 2012-04-07 NOTE — Assessment & Plan Note (Signed)
Discussed.

## 2012-04-07 NOTE — Assessment & Plan Note (Signed)
Better  

## 2012-04-07 NOTE — Assessment & Plan Note (Signed)
Wt Readings from Last 3 Encounters:  04/07/12 277 lb (125.646 kg)  03/27/12 280 lb (127.007 kg)  03/27/12 280 lb (127.007 kg)

## 2012-04-07 NOTE — Assessment & Plan Note (Signed)
Continue with current prescription therapy as reflected on the Med list. Wt loss discussed

## 2012-04-07 NOTE — Assessment & Plan Note (Addendum)
Continue with current prescription therapy as reflected on the Med list.  

## 2012-04-25 ENCOUNTER — Other Ambulatory Visit: Payer: Self-pay | Admitting: *Deleted

## 2012-04-25 NOTE — Telephone Encounter (Signed)
Ok Note: phentermine may carry some cardiac riscks - tachycardia etc Thx

## 2012-04-25 NOTE — Telephone Encounter (Signed)
Rec fax stating Belviq is not covered. Copay with discount card is still $157. They request to change to phentermine 37.5 = $ 12.99 copay. Please advise.

## 2012-04-26 NOTE — Telephone Encounter (Signed)
Left mess for patient to call back.  

## 2012-04-27 ENCOUNTER — Ambulatory Visit (INDEPENDENT_AMBULATORY_CARE_PROVIDER_SITE_OTHER): Payer: 59 | Admitting: Endocrinology

## 2012-04-27 ENCOUNTER — Encounter: Payer: Self-pay | Admitting: Endocrinology

## 2012-04-27 VITALS — BP 130/78 | HR 75 | Wt 278.0 lb

## 2012-04-27 DIAGNOSIS — E119 Type 2 diabetes mellitus without complications: Secondary | ICD-10-CM

## 2012-04-27 MED ORDER — INSULIN LISPRO 100 UNIT/ML ~~LOC~~ SOLN: SUBCUTANEOUS | Status: DC

## 2012-04-27 MED ORDER — PHENTERMINE HCL 37.5 MG PO TABS: 37.5000 mg | ORAL_TABLET | Freq: Every day | ORAL | Status: DC

## 2012-04-27 NOTE — Telephone Encounter (Signed)
Called pt again no answer

## 2012-04-27 NOTE — Telephone Encounter (Signed)
Rx phoned in. Pharmacists states she will put a note on Rx re: cardiac related symptoms.

## 2012-04-27 NOTE — Progress Notes (Signed)
Subjective:    Patient ID: Anthony Woods, male    DOB: 07/29/1955, 57 y.o.   MRN: [id number]  HPI Pt returns for f/u of IDDM (dx'ed 1998, complicated by CAD).  no cbg record, but states cbg's vary from 100-200's.  It is lowest in am, and highest in the afternoon.  pt states he feels well in general. Past Medical History  Diagnosis Date  . CAD (coronary artery disease)   . Type II or unspecified type diabetes mellitus without mention of complication, not stated as uncontrolled   . HTN (hypertension)   . Hypothyroidism   . Allergy to bee sting   . Macular degeneration   . Hyperlipidemia   . Obesity     Past Surgical History  Procedure Date  . Knee surgery     Meniscus removal    History   Social History  . Marital Status: Married    Spouse Name: N/A    Number of Children: 1  . Years of Education: N/A   Occupational History  . [employer]   . 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 Smoking status: Current Every Day Smoker -- 2.0 packs/day for 40 years    Types: Cigarettes  . Smokeless tobacco: Not on file  . Alcohol Use: No  . Drug Use: No  . Sexually Active: Yes   Other Topics Concern  . Not on file   Social History Narrative  . No narrative on file    Current Outpatient Prescriptions on File Prior to Visit  Medication Sig Dispense Refill  . aspirin 81 MG EC tablet Take 81 mg by mouth daily.        Marland Kitchen atenolol-chlorthalidone (TENORETIC) 100-25 MG per tablet TAKE 1 TABLET BY MOUTH DAILY.  30 tablet  5  . EPIPEN 2-PAK 0.3 MG/0.3ML DEVI USE AS DIRECTED FOR BEE STINGS.  2 each  1  . furosemide (LASIX) 40 MG tablet Take 1 tablet (40 mg total) by mouth daily.  30 tablet  11  . Insulin Pen Needle (PEN NEEDLES 31GX5/16") 31G X 8 MM MISC 1 Device by Does not apply route 3 (three) times daily before meals.  90 each  11  . Lancets (ONETOUCH ULTRASOFT) lancets TEST TWO TIMES A DAY  100 each  5  . levothyroxine (SYNTHROID, LEVOTHROID) 175 MCG tablet Take 1 tablet  (175 mcg total) by mouth daily.  90 tablet  2  . losartan (COZAAR) 100 MG tablet Take 100 mg by mouth daily.      . ONE TOUCH ULTRA TEST test strip TEST TWO TIMES A DAY  100 each  5  . predniSONE (DELTASONE) 10 MG tablet Take 60 mg by mouth as needed. For bee stings      . simvastatin (ZOCOR) 40 MG tablet Take 40 mg by mouth daily.        Allergies  Allergen Reactions  . Actos (Pioglitazone Hydrochloride)     swelling  . Bee Venom Itching  . Lisinopril     cough    Family History  Problem Relation Age of Onset  . Hypertension Other   . Diabetes Father   . Cancer Father     stomach  . Diabetes Mother   . Cancer Mother     male ca  . Diabetes Other   . CVA Father   . CAD Maternal Grandmother     BP 130/78  Pulse 75  Wt 278 lb (126.1 kg)  SpO2  98%    Review of Systems denies hypoglycemia    Objective:   Physical Exam VITAL SIGNS:  See vs page GENERAL: no distress PSYCH: Alert and oriented x 3.  Does not appear anxious nor depressed.     Assessment & Plan:  DM: needs increased rx

## 2012-04-27 NOTE — Patient Instructions (Addendum)
check your blood sugar 2 times a day.  vary the time of day when you check, between before the 3 meals, and at bedtime.  also check if you have symptoms of your blood sugar being too high or too low.  please keep a record of the readings and bring it to your next appointment here.  please call us sooner if your blood sugar goes below 70, or if you have a lot of readings over 200.  Please come back for a follow-up appointment in 1 month.   Increase humalog to 3x a day (just before each meal) 60-20-85 units.  There is no medical reason to eat a bedtime snack, but if you eat something at bedtime, take 5 units for it.

## 2012-05-25 ENCOUNTER — Encounter: Payer: Self-pay | Admitting: Endocrinology

## 2012-05-25 ENCOUNTER — Ambulatory Visit (INDEPENDENT_AMBULATORY_CARE_PROVIDER_SITE_OTHER): Payer: 59 | Admitting: Endocrinology

## 2012-05-25 VITALS — BP 134/80 | HR 80 | Wt 282.0 lb

## 2012-05-25 DIAGNOSIS — E119 Type 2 diabetes mellitus without complications: Secondary | ICD-10-CM

## 2012-05-25 LAB — HEMOGLOBIN A1C: Hgb A1c MFr Bld: 9.5 % — ABNORMAL HIGH (ref 4.6–6.5)

## 2012-05-25 LAB — MICROALBUMIN / CREATININE URINE RATIO
Creatinine,U: 20.3 mg/dL
Microalb Creat Ratio: 3.9 mg/g (ref 0.0–30.0)

## 2012-05-25 NOTE — Progress Notes (Signed)
Subjective:    Patient ID: Anthony Woods, male    DOB: 1956/02/13, 57 y.o.   MRN: [id number]  HPI Pt returns for f/u of IDDM (dx'ed 1998, complicated by CAD; he has never had severe hypoglycemia or DKA).  no cbg record, but states cbg's vary from 80 up to the low-200's.  It is lowest before breakfast and at lunch, and higher at other times of day pt states he feels well in general.   Past Medical History  Diagnosis Date  . CAD (coronary artery disease)   . Type II or unspecified type diabetes mellitus without mention of complication, not stated as uncontrolled   . HTN (hypertension)   . Hypothyroidism   . Allergy to bee sting   . Macular degeneration   . Hyperlipidemia   . Obesity     Past Surgical History  Procedure Laterality Date  . Knee surgery      Meniscus removal    History   Social History  . Marital Status: Married    Spouse Name: N/A    Number of Children: 1  . Years of Education: N/A   Occupational History  . [employer]   . 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 Smoking status: Current Every Day Smoker -- 2.00 packs/day for 40 years    Types: Cigarettes  . Smokeless tobacco: Not on file  . Alcohol Use: No  . Drug Use: No  . Sexually Active: Yes   Other Topics Concern  . Not on file   Social History Narrative  . No narrative on file    Current Outpatient Prescriptions on File Prior to Visit  Medication Sig Dispense Refill  . aspirin 81 MG EC tablet Take 81 mg by mouth daily.        Marland Kitchen atenolol-chlorthalidone (TENORETIC) 100-25 MG per tablet TAKE 1 TABLET BY MOUTH DAILY.  30 tablet  5  . EPIPEN 2-PAK 0.3 MG/0.3ML DEVI USE AS DIRECTED FOR BEE STINGS.  2 each  1  . furosemide (LASIX) 40 MG tablet Take 1 tablet (40 mg total) by mouth daily.  30 tablet  11  . Insulin Pen Needle (PEN NEEDLES 31GX5/16") 31G X 8 MM MISC 1 Device by Does not apply route 3 (three) times daily before meals.  90 each  11  . Lancets (ONETOUCH ULTRASOFT) lancets TEST TWO  TIMES A DAY  100 each  5  . levothyroxine (SYNTHROID, LEVOTHROID) 175 MCG tablet Take 1 tablet (175 mcg total) by mouth daily.  90 tablet  2  . losartan (COZAAR) 100 MG tablet Take 100 mg by mouth daily.      . ONE TOUCH ULTRA TEST test strip TEST TWO TIMES A DAY  100 each  5  . phentermine (ADIPEX-P) 37.5 MG tablet Take 1 tablet (37.5 mg total) by mouth daily before breakfast.  30 tablet  2  . predniSONE (DELTASONE) 10 MG tablet Take 60 mg by mouth as needed. For bee stings      . simvastatin (ZOCOR) 40 MG tablet Take 40 mg by mouth daily.       No current facility-administered medications on file prior to visit.    Allergies  Allergen Reactions  . Actos (Pioglitazone Hydrochloride)     swelling  . Bee Venom Itching  . Lisinopril     cough    Family History  Problem Relation Age of Onset  . Hypertension Other   . Diabetes Father   .  Cancer Father     stomach  . Diabetes Mother   . Cancer Mother     male ca  . Diabetes Other   . CVA Father   . CAD Maternal Grandmother     BP 134/80  Pulse 80  Wt 282 lb (127.914 kg)  BMI 46.93 kg/m2  SpO2 98%  Review of Systems denies hypoglycemia    Objective:   Physical Exam VITAL SIGNS:  See vs page GENERAL: no distress PSYCH: Alert and oriented x 3.  Does not appear anxious nor depressed.     Assessment & Plan:  DM: improved control

## 2012-05-25 NOTE — Patient Instructions (Addendum)
check your blood sugar 2 times a day.  vary the time of day when you check, between before the 3 meals, and at bedtime.  also check if you have symptoms of your blood sugar being too high or too low.  please keep a record of the readings and bring it to your next appointment here.  please call us sooner if your blood sugar goes below 70, or if you have a lot of readings over 200.  Please come back for a follow-up appointment in 1 month.   Increase humalog to 3x a day (just before each meal) 60-25-90 units.  There is no medical reason to eat a bedtime snack, but if you eat something at bedtime, take 5 units for it.

## 2012-06-21 ENCOUNTER — Other Ambulatory Visit: Payer: Self-pay | Admitting: Internal Medicine

## 2012-06-22 ENCOUNTER — Other Ambulatory Visit: Payer: Self-pay | Admitting: *Deleted

## 2012-06-22 MED ORDER — ATENOLOL-CHLORTHALIDONE 100-25 MG PO TABS: 1.0000 | ORAL_TABLET | Freq: Every day | ORAL | Status: DC

## 2012-06-22 MED ORDER — SIMVASTATIN 40 MG PO TABS: 40.0000 mg | ORAL_TABLET | Freq: Every day | ORAL | Status: DC

## 2012-06-22 MED ORDER — LEVOTHYROXINE SODIUM 175 MCG PO TABS: 175.0000 ug | ORAL_TABLET | Freq: Every day | ORAL | Status: DC

## 2012-07-10 ENCOUNTER — Ambulatory Visit (INDEPENDENT_AMBULATORY_CARE_PROVIDER_SITE_OTHER): Payer: 59 | Admitting: Endocrinology

## 2012-07-10 ENCOUNTER — Encounter: Payer: Self-pay | Admitting: Endocrinology

## 2012-07-10 ENCOUNTER — Other Ambulatory Visit: Payer: Self-pay

## 2012-07-10 VITALS — BP 134/70 | HR 79 | Wt 279.0 lb

## 2012-07-10 DIAGNOSIS — E119 Type 2 diabetes mellitus without complications: Secondary | ICD-10-CM

## 2012-07-10 MED ORDER — INSULIN GLARGINE 100 UNIT/ML ~~LOC~~ SOLN: 100.0000 [IU] | SUBCUTANEOUS | Status: DC

## 2012-07-10 NOTE — Progress Notes (Signed)
Subjective:    Patient ID: Anthony Woods, male    DOB: 1955/07/27, 57 y.o.   MRN: [id number]  HPI Pt returns for f/u of IDDM (dx'ed 1998, complicated by CAD; he has never had severe hypoglycemia or DKA).  he brings a record of his cbg's which i have reviewed today.  It varies from 50's up to the 400's.  There is no trend throughout the day.  He says exercise also does not correlate with cbg's. Past Medical History  Diagnosis Date  . CAD (coronary artery disease)   . Type II or unspecified type diabetes mellitus without mention of complication, not stated as uncontrolled   . HTN (hypertension)   . Hypothyroidism   . Allergy to bee sting   . Macular degeneration   . Hyperlipidemia   . Obesity     Past Surgical History  Procedure Laterality Date  . Knee surgery      Meniscus removal    History   Social History  . Marital Status: Married    Spouse Name: N/A    Number of Children: 1  . Years of Education: N/A   Occupational History  . [employer]   . 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 Smoking status: Current Every Day Smoker -- 2.00 packs/day for 40 years    Types: Cigarettes  . Smokeless tobacco: Not on file  . Alcohol Use: No  . Drug Use: No  . Sexually Active: Yes   Other Topics Concern  . Not on file   Social History Narrative  . No narrative on file    Current Outpatient Prescriptions on File Prior to Visit  Medication Sig Dispense Refill  . aspirin 81 MG EC tablet Take 81 mg by mouth daily.        Marland Kitchen atenolol-chlorthalidone (TENORETIC) 100-25 MG per tablet Take 1 tablet by mouth daily.  30 tablet  4  . EPIPEN 2-PAK 0.3 MG/0.3ML DEVI USE AS DIRECTED FOR BEE STINGS.  2 each  1  . furosemide (LASIX) 40 MG tablet Take 1 tablet (40 mg total) by mouth daily.  30 tablet  11  . insulin lispro (HUMALOG) 100 UNIT/ML injection 3 times daily (just before each meal) 60-25-90 units, and pen needles 3/day      . Insulin Pen Needle (PEN NEEDLES 31GX5/16") 31G X 8  MM MISC 1 Device by Does not apply route 3 (three) times daily before meals.  90 each  11  . Lancets (ONETOUCH ULTRASOFT) lancets TEST TWO TIMES A DAY  100 each  5  . levothyroxine (SYNTHROID, LEVOTHROID) 175 MCG tablet TAKE 1 TABLET BY MOUTH DAILY.  90 tablet  2  . levothyroxine (SYNTHROID, LEVOTHROID) 175 MCG tablet Take 1 tablet (175 mcg total) by mouth daily.  90 tablet  4  . losartan (COZAAR) 100 MG tablet Take 100 mg by mouth daily.      . ONE TOUCH ULTRA TEST test strip TEST TWO TIMES A DAY  100 each  5  . phentermine (ADIPEX-P) 37.5 MG tablet Take 1 tablet (37.5 mg total) by mouth daily before breakfast.  30 tablet  2  . predniSONE (DELTASONE) 10 MG tablet Take 60 mg by mouth as needed. For bee stings      . simvastatin (ZOCOR) 40 MG tablet TAKE 1 TABLET BY MOUTH AT BEDTIME.  90 tablet  2  . simvastatin (ZOCOR) 40 MG tablet Take 1 tablet (40 mg total) by mouth daily.  30 tablet  4   No current facility-administered medications on file prior to visit.    Allergies  Allergen Reactions  . Actos (Pioglitazone Hydrochloride)     swelling  . Bee Venom Itching  . Lisinopril     cough    Family History  Problem Relation Age of Onset  . Hypertension Other   . Diabetes Father   . Cancer Father     stomach  . Diabetes Mother   . Cancer Mother     male ca  . Diabetes Other   . CVA Father   . CAD Maternal Grandmother    BP 134/70  Pulse 79  Wt 279 lb (126.554 kg)  BMI 46.43 kg/m2  SpO2 95%  Review of Systems Denies LOC    Objective:   Physical Exam VITAL SIGNS:  See vs page GENERAL: no distress Pulses: dorsalis pedis intact bilat.   Feet: no deformity.  no ulcer on the feet.  feet are of normal color and temp.  1+ bilat leg edema Neuro: sensation is intact to touch on the feet.      Assessment & Plan:  DM: He may do better with a simpler regimen

## 2012-07-10 NOTE — Patient Instructions (Addendum)
check your blood sugar 2 times a day.  vary the time of day when you check, between before the 3 meals, and at bedtime.  also check if you have symptoms of your blood sugar being too high or too low.  please keep a record of the readings and bring it to your next appointment here.  please call us sooner if your blood sugar goes below 70, or if you have a lot of readings over 200.  Please come back for a follow-up appointment in 1 month.   Please take humalog 50 units, once a day, with the evening meal.  Start lantus, 100 units every morning.

## 2012-07-13 ENCOUNTER — Telehealth: Payer: Self-pay

## 2012-07-13 ENCOUNTER — Other Ambulatory Visit: Payer: Self-pay | Admitting: *Deleted

## 2012-07-13 MED ORDER — GLUCAGON (RDNA) 1 MG IJ KIT: 1.0000 mg | PACK | Freq: Once | INTRAMUSCULAR | Status: DC | PRN

## 2012-07-13 NOTE — Telephone Encounter (Signed)
please call patient: Was the cbg of 82 before or after the humalog?

## 2012-07-13 NOTE — Telephone Encounter (Signed)
Called pt and LVM per Dr Everardo All to try reducing the lantus to 90 units, and the same humalog. Advised if he had any questions or problems to call our office.

## 2012-07-13 NOTE — Telephone Encounter (Signed)
Pt's wife called pt's cbg yesterday was 239 am, last pm 82 and he did not take the 50 units of Humlaog qhs this am cbg is 168 should he take th e100 units Lantus this am?

## 2012-07-13 NOTE — Telephone Encounter (Signed)
Pt states it was before the humalog 

## 2012-07-13 NOTE — Telephone Encounter (Signed)
Pt states it was before the humalog

## 2012-07-13 NOTE — Telephone Encounter (Signed)
please call patient: Try reducing the lantus to 90 units, and the same humalog, thanks.

## 2012-07-19 ENCOUNTER — Other Ambulatory Visit: Payer: Self-pay | Admitting: Endocrinology

## 2012-07-24 ENCOUNTER — Telehealth: Payer: Self-pay | Admitting: *Deleted

## 2012-07-24 NOTE — Telephone Encounter (Signed)
Rf req for Phentermine 37.5 mg 1 po qam. # 30. Last filled 06/27/12. Ok to Rf?

## 2012-07-25 MED ORDER — PHENTERMINE HCL 37.5 MG PO TABS: 37.5000 mg | ORAL_TABLET | Freq: Every day | ORAL | Status: DC

## 2012-07-25 NOTE — Telephone Encounter (Signed)
OK to fill this prescription with additional refills x2 Thank you!  

## 2012-07-25 NOTE — Telephone Encounter (Signed)
Done

## 2012-08-08 ENCOUNTER — Ambulatory Visit (INDEPENDENT_AMBULATORY_CARE_PROVIDER_SITE_OTHER): Payer: 59 | Admitting: Internal Medicine

## 2012-08-08 ENCOUNTER — Encounter: Payer: Self-pay | Admitting: Internal Medicine

## 2012-08-08 VITALS — BP 130/82 | HR 80 | Temp 98.2°F | Resp 16 | Wt 282.0 lb

## 2012-08-08 DIAGNOSIS — E119 Type 2 diabetes mellitus without complications: Secondary | ICD-10-CM

## 2012-08-08 DIAGNOSIS — Z1211 Encounter for screening for malignant neoplasm of colon: Secondary | ICD-10-CM

## 2012-08-08 DIAGNOSIS — I2510 Atherosclerotic heart disease of native coronary artery without angina pectoris: Secondary | ICD-10-CM

## 2012-08-08 DIAGNOSIS — F17200 Nicotine dependence, unspecified, uncomplicated: Secondary | ICD-10-CM

## 2012-08-08 DIAGNOSIS — R21 Rash and other nonspecific skin eruption: Secondary | ICD-10-CM

## 2012-08-08 DIAGNOSIS — G4700 Insomnia, unspecified: Secondary | ICD-10-CM

## 2012-08-08 MED ORDER — LOSARTAN POTASSIUM 100 MG PO TABS: 100.0000 mg | ORAL_TABLET | Freq: Every day | ORAL | Status: DC

## 2012-08-08 MED ORDER — TRIAMCINOLONE ACETONIDE 0.5 % EX CREA: TOPICAL_CREAM | Freq: Three times a day (TID) | CUTANEOUS | Status: DC

## 2012-08-08 NOTE — Assessment & Plan Note (Signed)
Cont to work w/Dr Everardo All Last A1c was better!

## 2012-08-08 NOTE — Assessment & Plan Note (Signed)
Discussed.

## 2012-08-08 NOTE — Assessment & Plan Note (Signed)
Sleep test suggested

## 2012-08-08 NOTE — Progress Notes (Signed)
   Subjective:  HPI  The patient presents for a follow-up of  chronic hypertension, chronic dyslipidemia, type 2 diabetes better controlled with medicines/insulin; wt gain, CAD C/o rash on R shin  He cont to smoke 1.5 ppd  Wt Readings from Last 3 Encounters:  08/08/12 282 lb (127.914 kg)  07/10/12 279 lb (126.554 kg)  05/25/12 282 lb (127.914 kg)   BP Readings from Last 3 Encounters:  08/08/12 130/82  07/10/12 134/70  05/25/12 134/80      Review of Systems  Constitutional: Negative for appetite change, fatigue and unexpected weight change.  HENT: Negative for nosebleeds, congestion, sore throat, sneezing, trouble swallowing and neck pain.   Eyes: Negative for itching and visual disturbance.  Respiratory: Negative for cough.   Cardiovascular: Negative for chest pain, palpitations and leg swelling.  Gastrointestinal: Negative for nausea, diarrhea, blood in stool and abdominal distention.  Genitourinary: Negative for frequency and hematuria.  Musculoskeletal: Negative for back pain, joint swelling and gait problem.  Skin: Negative for rash.  Neurological: Negative for dizziness, tremors, speech difficulty and weakness.  Psychiatric/Behavioral: Negative for sleep disturbance, dysphoric mood and agitation. The patient is not nervous/anxious.        Objective:   Physical Exam  Vitals reviewed. Constitutional: He is oriented to person, place, and time. He appears well-developed.  obese  HENT:  Mouth/Throat: Oropharynx is clear and moist.  Eyes: Conjunctivae are normal. Pupils are equal, round, and reactive to light.  Neck: Normal range of motion. No JVD present. No thyromegaly present.  Cardiovascular: Normal rate, regular rhythm, normal heart sounds and intact distal pulses.  Exam reveals no gallop and no friction rub.   No murmur heard. Pulmonary/Chest: Effort normal and breath sounds normal. No respiratory distress. He has no wheezes. He has no rales. He exhibits no  tenderness.  Abdominal: Soft. Bowel sounds are normal. He exhibits no distension and no mass. There is no tenderness. There is no rebound and no guarding.  Musculoskeletal: Normal range of motion. He exhibits no edema and no tenderness.  Lymphadenopathy:    He has no cervical adenopathy.  Neurological: He is alert and oriented to person, place, and time. He has normal reflexes. No cranial nerve deficit. He exhibits normal muscle tone. Coordination normal.  Skin: Skin is warm and dry. No rash noted.  Psychiatric: He has a normal mood and affect. His behavior is normal. Judgment and thought content normal.  mild dry rash on R anter dist shin  Lab Results  Component Value Date   WBC 9.1 02/01/2012   HGB 15.6 02/01/2012   HCT 47.0 02/01/2012   PLT 175.0 02/01/2012   GLUCOSE 142* 02/01/2012   CHOL 183 01/06/2012   TRIG 122.0 01/06/2012   HDL 44.40 01/06/2012   LDLDIRECT 90.6 06/08/2011   LDLCALC 114* 01/06/2012   ALT 19 01/06/2012   AST 20 01/06/2012   NA 136 02/01/2012   K 3.4* 02/01/2012   CL 96 02/01/2012   CREATININE 1.0 02/01/2012   BUN 14 02/01/2012   CO2 32 02/01/2012   TSH 0.55 06/08/2011   PSA 0.30 06/08/2011   INR 1.0 02/01/2012   HGBA1C 9.5* 05/25/2012   MICROALBUR 0.8 05/25/2012         Assessment & Plan:

## 2012-08-08 NOTE — Assessment & Plan Note (Signed)
Continue with current prescription therapy as reflected on the Med list.  

## 2012-08-08 NOTE — Assessment & Plan Note (Addendum)
Wt Readings from Last 3 Encounters:  08/08/12 282 lb (127.914 kg)  07/10/12 279 lb (126.554 kg)  05/25/12 282 lb (127.914 kg)   Sleep test suggested

## 2012-08-08 NOTE — Assessment & Plan Note (Signed)
5/14 R shin eczema Triamc cream tid

## 2012-08-09 ENCOUNTER — Encounter: Payer: Self-pay | Admitting: Internal Medicine

## 2012-08-09 ENCOUNTER — Ambulatory Visit (INDEPENDENT_AMBULATORY_CARE_PROVIDER_SITE_OTHER): Payer: 59 | Admitting: Endocrinology

## 2012-08-09 ENCOUNTER — Encounter: Payer: Self-pay | Admitting: Endocrinology

## 2012-08-09 VITALS — BP 126/70 | HR 76 | Ht 65.0 in | Wt 282.0 lb

## 2012-08-09 DIAGNOSIS — E119 Type 2 diabetes mellitus without complications: Secondary | ICD-10-CM

## 2012-08-09 NOTE — Progress Notes (Signed)
Subjective:    Patient ID: Anthony Woods, male    DOB: 12-25-1955, 57 y.o.   MRN: [id number]  HPI Pt returns for f/u of IDDM (dx'ed 1998; he has little if any neuropathy of the lower extremities; he has associated CAD; he has never had severe hypoglycemia or DKA).  no cbg record, but states cbg's are still sometimes low in the middle of the night, even with the reduction of lantus to 90 units qd.  He denies LOC.  Otherwise, cbg's are in the 100's-200's.   Past Medical History  Diagnosis Date  . CAD (coronary artery disease)   . Type II or unspecified type diabetes mellitus without mention of complication, not stated as uncontrolled   . HTN (hypertension)   . Hypothyroidism   . Allergy to bee sting   . Macular degeneration   . Hyperlipidemia   . Obesity     Past Surgical History  Procedure Laterality Date  . Knee surgery      Meniscus removal    History   Social History  . Marital Status: Married    Spouse Name: N/A    Number of Children: 1  . Years of Education: N/A   Occupational History  . [employer]   . 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 Smoking status: Current Every Day Smoker -- 2.00 packs/day for 40 years    Types: Cigarettes  . Smokeless tobacco: Not on file  . Alcohol Use: No  . Drug Use: No  . Sexually Active: Yes   Other Topics Concern  . Not on file   Social History Narrative  . No narrative on file    Current Outpatient Prescriptions on File Prior to Visit  Medication Sig Dispense Refill  . aspirin 81 MG EC tablet Take 81 mg by mouth daily.        Marland Kitchen atenolol-chlorthalidone (TENORETIC) 100-25 MG per tablet Take 1 tablet by mouth daily.  30 tablet  4  . EPIPEN 2-PAK 0.3 MG/0.3ML DEVI USE AS DIRECTED FOR BEE STINGS.  2 each  1  . furosemide (LASIX) 40 MG tablet Take 1 tablet (40 mg total) by mouth daily.  30 tablet  11  . glucagon (GLUCAGON EMERGENCY) 1 MG injection Inject 1 mg into the vein once as needed.  1 each  4  . insulin lispro  (HUMALOG) 100 UNIT/ML injection Inject 50 Units into the skin daily. With the evening meal, and pen needles 2/day      . Insulin Pen Needle (PEN NEEDLES 31GX5/16") 31G X 8 MM MISC 1 Device by Does not apply route 3 (three) times daily before meals.  90 each  11  . Lancets (ONETOUCH ULTRASOFT) lancets TEST TWO TIMES A DAY  100 each  5  . levothyroxine (SYNTHROID, LEVOTHROID) 175 MCG tablet TAKE 1 TABLET BY MOUTH DAILY.  90 tablet  2  . levothyroxine (SYNTHROID, LEVOTHROID) 175 MCG tablet Take 1 tablet (175 mcg total) by mouth daily.  90 tablet  4  . losartan (COZAAR) 100 MG tablet Take 1 tablet (100 mg total) by mouth daily.  90 tablet  3  . ONE TOUCH ULTRA TEST test strip TEST TWO TIMES A DAY  100 each  6  . phentermine (ADIPEX-P) 37.5 MG tablet Take 1 tablet (37.5 mg total) by mouth daily before breakfast.  30 tablet  2  . predniSONE (DELTASONE) 10 MG tablet Take 60 mg by mouth as needed. For bee stings      .  simvastatin (ZOCOR) 40 MG tablet TAKE 1 TABLET BY MOUTH AT BEDTIME.  90 tablet  2  . triamcinolone cream (KENALOG) 0.5 % Apply topically 3 (three) times daily. For rash  45 g  1   No current facility-administered medications on file prior to visit.    Allergies  Allergen Reactions  . Actos (Pioglitazone Hydrochloride)     swelling  . Bee Venom Itching  . Lisinopril     cough    Family History  Problem Relation Age of Onset  . Hypertension Other   . Diabetes Father   . Cancer Father     stomach  . Diabetes Mother   . Cancer Mother     male ca  . Diabetes Other   . CVA Father   . CAD Maternal Grandmother     BP 126/70  Pulse 76  Ht 5\' 5"  (1.651 m)  Wt 282 lb (127.914 kg)  BMI 46.93 kg/m2  SpO2 98%   Review of Systems Denies n/v.  He has weight gain.    Objective:   Physical Exam VITAL SIGNS:  See vs page GENERAL: no distress PSYCH: Alert and oriented x 3.  Does not appear anxious nor depressed.       Assessment & Plan:  DM: therapy limited by pt's need  for a simple regimen.  This insulin regimen was chosen from multiple possibilities, as it is the best available option.

## 2012-08-09 NOTE — Patient Instructions (Addendum)
check your blood sugar 2 times a day.  vary the time of day when you check, between before the 3 meals, and at bedtime.  also check if you have symptoms of your blood sugar being too high or too low.  please keep a record of the readings and bring it to your next appointment here.  please call us sooner if your blood sugar goes below 70, or if you have a lot of readings over 200.  Please come back for a follow-up appointment in 1 month.   Please reduce the lantus to 80 units every morning.

## 2012-09-06 ENCOUNTER — Telehealth: Payer: Self-pay | Admitting: *Deleted

## 2012-09-06 NOTE — Telephone Encounter (Signed)
Pt is scheduled to have a colonoscopy with Dr. Leone Payor 09-27-12.  However, when I was getting his chart ready- he had a colonoscopy with Dr. Arlyce Dice in 2003.  I called him and explained the situation and appointment changed to Dr. Arlyce Dice on 10-16-12 at 2:00 p.m.  PV appt kept

## 2012-09-08 ENCOUNTER — Other Ambulatory Visit: Payer: Self-pay | Admitting: Endocrinology

## 2012-09-08 ENCOUNTER — Other Ambulatory Visit: Payer: Self-pay | Admitting: *Deleted

## 2012-09-08 MED ORDER — GLUCOSE BLOOD VI STRP: 1.0000 | ORAL_STRIP | Freq: Two times a day (BID) | Status: DC

## 2012-09-11 ENCOUNTER — Ambulatory Visit (INDEPENDENT_AMBULATORY_CARE_PROVIDER_SITE_OTHER): Payer: 59 | Admitting: Endocrinology

## 2012-09-11 ENCOUNTER — Encounter: Payer: Self-pay | Admitting: Gastroenterology

## 2012-09-11 ENCOUNTER — Ambulatory Visit (AMBULATORY_SURGERY_CENTER): Payer: 59 | Admitting: *Deleted

## 2012-09-11 ENCOUNTER — Encounter: Payer: Self-pay | Admitting: Endocrinology

## 2012-09-11 VITALS — Ht 65.0 in | Wt 277.0 lb

## 2012-09-11 VITALS — BP 132/80 | HR 80 | Ht 65.0 in | Wt 278.0 lb

## 2012-09-11 DIAGNOSIS — Z800 Family history of malignant neoplasm of digestive organs: Secondary | ICD-10-CM

## 2012-09-11 DIAGNOSIS — Z1211 Encounter for screening for malignant neoplasm of colon: Secondary | ICD-10-CM

## 2012-09-11 DIAGNOSIS — E119 Type 2 diabetes mellitus without complications: Secondary | ICD-10-CM

## 2012-09-11 MED ORDER — INSULIN DETEMIR 100 UNIT/ML FLEXPEN: 150.0000 [IU] | PEN_INJECTOR | SUBCUTANEOUS | Status: DC

## 2012-09-11 MED ORDER — NA SULFATE-K SULFATE-MG SULF 17.5-3.13-1.6 GM/177ML PO SOLN: ORAL | Status: DC

## 2012-09-11 NOTE — Progress Notes (Signed)
Explained to patient to check BS frequently and drink plenty of fluids and some juice while doing the Suprep. Our 24 hour # given to patient also. He verbalizes understanding.

## 2012-09-11 NOTE — Progress Notes (Signed)
Subjective:    Patient ID: Anthony Woods, male    DOB: 03-Jul-1955, 57 y.o.   MRN: [id number]  HPI Pt returns for f/u of IDDM (dx'ed 1998; he has little if any neuropathy of the lower extremities; he has associated CAD; he has never had severe hypoglycemia or DKA; therapy has been limited by pt's need for a simple regimen).  no cbg record, but states cbg's are still sometimes low in the middle of the night.  He attributes this to forgetting the humalog until well after the evening meal. Past Medical History  Diagnosis Date  . CAD (coronary artery disease)   . Type II or unspecified type diabetes mellitus without mention of complication, not stated as uncontrolled   . HTN (hypertension)   . Hypothyroidism   . Allergy to bee sting   . Macular degeneration   . Hyperlipidemia   . Obesity     Past Surgical History  Procedure Laterality Date  . Knee surgery      Meniscus removal  . Wrist ganglion excision      left    History   Social History  . Marital Status: Married    Spouse Name: N/A    Number of Children: 1  . Years of Education: N/A   Occupational History  . [employer]   . 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 Smoking status: Current Every Day Smoker -- 2.00 packs/day for 40 years    Types: Cigarettes  . Smokeless tobacco: Never Used  . Alcohol Use: No  . Drug Use: No  . Sexually Active: Yes   Other Topics Concern  . Not on file   Social History Narrative  . No narrative on file    Current Outpatient Prescriptions on File Prior to Visit  Medication Sig Dispense Refill  . aspirin 81 MG EC tablet Take 81 mg by mouth daily.        Marland Kitchen atenolol-chlorthalidone (TENORETIC) 100-25 MG per tablet Take 1 tablet by mouth daily.  30 tablet  4  . EPIPEN 2-PAK 0.3 MG/0.3ML DEVI USE AS DIRECTED FOR BEE STINGS.  2 each  1  . furosemide (LASIX) 40 MG tablet Take 1 tablet (40 mg total) by mouth daily.  30 tablet  11  . glucagon (GLUCAGON EMERGENCY) 1 MG injection  Inject 1 mg into the vein once as needed.  1 each  4  . glucose blood (ONE TOUCH ULTRA TEST) test strip 1 each by Other route 2 (two) times daily. Use as instructed  100 each  6  . Lancets (ONETOUCH ULTRASOFT) lancets TEST TWO TIMES A DAY  100 each  5  . levothyroxine (SYNTHROID, LEVOTHROID) 175 MCG tablet TAKE 1 TABLET BY MOUTH DAILY.  90 tablet  2  . levothyroxine (SYNTHROID, LEVOTHROID) 175 MCG tablet Take 1 tablet (175 mcg total) by mouth daily.  90 tablet  4  . losartan (COZAAR) 100 MG tablet Take 1 tablet (100 mg total) by mouth daily.  90 tablet  3  . ONE TOUCH ULTRA TEST test strip TEST TWO TIMES A DAY  100 each  6  . phentermine (ADIPEX-P) 37.5 MG tablet Take 1 tablet (37.5 mg total) by mouth daily before breakfast.  30 tablet  2  . predniSONE (DELTASONE) 10 MG tablet Take 60 mg by mouth as needed. For bee stings      . simvastatin (ZOCOR) 40 MG tablet TAKE 1 TABLET BY MOUTH AT BEDTIME.  90  tablet  2  . triamcinolone cream (KENALOG) 0.5 % Apply topically 3 (three) times daily. For rash  45 g  1   No current facility-administered medications on file prior to visit.    Allergies  Allergen Reactions  . Actos (Pioglitazone Hydrochloride)     swelling  . Bee Venom Itching  . Lisinopril     cough    Family History  Problem Relation Age of Onset  . Hypertension Other   . Diabetes Father   . Cancer Father     stomach  . CVA Father   . Stomach cancer Father   . Diabetes Mother   . Cancer Mother     male ca  . Colon cancer Mother 26  . Diabetes Other   . CAD Maternal Grandmother    BP 132/80  Pulse 80  Ht 5\' 5"  (1.651 m)  Wt 278 lb (126.1 kg)  BMI 46.26 kg/m2  SpO2 98%  Review of Systems Denies weight change and LOC    Objective:   Physical Exam VITAL SIGNS:  See vs page GENERAL: no distress  Lab Results  Component Value Date   HGBA1C 9.5* 05/25/2012      Assessment & Plan:  DM: This insulin regimen was chosen from multiple options, as it is simpler than his  current insulin regimen.  The benefits of glycemic control must be weighed against the risks of hypoglycemia.

## 2012-09-11 NOTE — Patient Instructions (Addendum)
check your blood sugar 2 times a day.  vary the time of day when you check, between before the 3 meals, and at bedtime.  also check if you have symptoms of your blood sugar being too high or too low.  please keep a record of the readings and bring it to your next appointment here.  please call us sooner if your blood sugar goes below 70, or if you have a lot of readings over 200.  Please come back for a follow-up appointment in 3 months.   Please change the lantus and humalog to "levemir,"  150 units each morning. This may not be enough insulin.  Please call if your blood sugar is high.

## 2012-09-27 ENCOUNTER — Encounter: Payer: Self-pay | Admitting: Internal Medicine

## 2012-09-28 ENCOUNTER — Telehealth: Payer: Self-pay | Admitting: *Deleted

## 2012-09-28 NOTE — Telephone Encounter (Signed)
Rf req for Phentermine 37.5 mg 1 po qam. # 30. Last filled 09/20/12. Ok to Rf?

## 2012-09-29 MED ORDER — PHENTERMINE HCL 37.5 MG PO TABS: 37.5000 mg | ORAL_TABLET | Freq: Every day | ORAL | Status: DC

## 2012-09-29 NOTE — Telephone Encounter (Signed)
Ok x 2 mo Thx

## 2012-09-29 NOTE — Telephone Encounter (Signed)
Done to fill in 30 days from last fill which was 09/20/12.

## 2012-10-04 ENCOUNTER — Ambulatory Visit (INDEPENDENT_AMBULATORY_CARE_PROVIDER_SITE_OTHER): Payer: Self-pay | Admitting: Ophthalmology

## 2012-10-11 ENCOUNTER — Ambulatory Visit (INDEPENDENT_AMBULATORY_CARE_PROVIDER_SITE_OTHER): Payer: 59 | Admitting: Endocrinology

## 2012-10-11 ENCOUNTER — Encounter: Payer: Self-pay | Admitting: Endocrinology

## 2012-10-11 VITALS — BP 100/60 | HR 62 | Temp 98.7°F | Ht 65.0 in | Wt 270.9 lb

## 2012-10-11 DIAGNOSIS — E119 Type 2 diabetes mellitus without complications: Secondary | ICD-10-CM

## 2012-10-11 MED ORDER — INSULIN ISOPHANE HUMAN 100 UNIT/ML KWIKPEN: 150.0000 [IU] | PEN_INJECTOR | SUBCUTANEOUS | Status: DC

## 2012-10-11 NOTE — Patient Instructions (Addendum)
check your blood sugar 2 times a day.  vary the time of day when you check, between before the 3 meals, and at bedtime.  also check if you have symptoms of your blood sugar being too high or too low.  please keep a record of the readings and bring it to your next appointment here.  please call us sooner if your blood sugar goes below 70, or if you have a lot of readings over 200.  Please come back for a follow-up appointment in 1 month.   Please change the levemir to "NPH", 150 units each morning.  When you come back, we'll address the fact that you seem to need more insulin on the weekend.

## 2012-10-11 NOTE — Progress Notes (Signed)
Subjective:    Patient ID: Anthony Woods, male    DOB: 03-12-1956, 57 y.o.   MRN: [id number]  HPI Pt returns for f/u of IDDM (dx'ed 1998; he has little if any neuropathy of the lower extremities; he has associated CAD; he has never had severe hypoglycemia or DKA; therapy has been limited by pt's need for a simple regimen).  no cbg record, but states cbg's vary from 79-317.  It is in general higher as the day goes on.   Past Medical History  Diagnosis Date  . CAD (coronary artery disease)   . Type II or unspecified type diabetes mellitus without mention of complication, not stated as uncontrolled   . HTN (hypertension)   . Hypothyroidism   . Allergy to bee sting   . Macular degeneration   . Hyperlipidemia   . Obesity     Past Surgical History  Procedure Laterality Date  . Knee surgery      Meniscus removal  . Wrist ganglion excision      left    History   Social History  . Marital Status: Married    Spouse Name: N/A    Number of Children: 1  . Years of Education: N/A   Occupational History  . [employer]   . 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 Smoking status: Current Every Day Smoker -- 2.00 packs/day for 40 years    Types: Cigarettes  . Smokeless tobacco: Never Used  . Alcohol Use: No  . Drug Use: No  . Sexually Active: Yes   Other Topics Concern  . Not on file   Social History Narrative  . No narrative on file    Current Outpatient Prescriptions on File Prior to Visit  Medication Sig Dispense Refill  . aspirin 81 MG EC tablet Take 81 mg by mouth daily.        Marland Kitchen atenolol-chlorthalidone (TENORETIC) 100-25 MG per tablet Take 1 tablet by mouth daily.  30 tablet  4  . EPIPEN 2-PAK 0.3 MG/0.3ML DEVI USE AS DIRECTED FOR BEE STINGS.  2 each  1  . furosemide (LASIX) 40 MG tablet Take 1 tablet (40 mg total) by mouth daily.  30 tablet  11  . glucagon (GLUCAGON EMERGENCY) 1 MG injection Inject 1 mg into the vein once as needed.  1 each  4  . glucose blood  (ONE TOUCH ULTRA TEST) test strip 1 each by Other route 2 (two) times daily. Use as instructed  100 each  6  . Lancets (ONETOUCH ULTRASOFT) lancets TEST TWO TIMES A DAY  100 each  5  . levothyroxine (SYNTHROID, LEVOTHROID) 175 MCG tablet TAKE 1 TABLET BY MOUTH DAILY.  90 tablet  2  . levothyroxine (SYNTHROID, LEVOTHROID) 175 MCG tablet Take 1 tablet (175 mcg total) by mouth daily.  90 tablet  4  . losartan (COZAAR) 100 MG tablet Take 1 tablet (100 mg total) by mouth daily.  90 tablet  3  . Na Sulfate-K Sulfate-Mg Sulf SOLN Take as directed.  354 mL  0  . ONE TOUCH ULTRA TEST test strip TEST TWO TIMES A DAY  100 each  6  . phentermine (ADIPEX-P) 37.5 MG tablet Take 1 tablet (37.5 mg total) by mouth daily before breakfast.  30 tablet  1  . predniSONE (DELTASONE) 10 MG tablet Take 60 mg by mouth as needed. For bee stings      . simvastatin (ZOCOR) 40 MG tablet TAKE 1  TABLET BY MOUTH AT BEDTIME.  90 tablet  2  . triamcinolone cream (KENALOG) 0.5 % Apply topically 3 (three) times daily. For rash  45 g  1   No current facility-administered medications on file prior to visit.    Allergies  Allergen Reactions  . Actos (Pioglitazone Hydrochloride)     swelling  . Bee Venom Itching  . Lisinopril     cough    Family History  Problem Relation Age of Onset  . Hypertension Other   . Diabetes Father   . Cancer Father     stomach  . CVA Father   . Stomach cancer Father   . Diabetes Mother   . Cancer Mother     male ca  . Colon cancer Mother 75  . Diabetes Other   . CAD Maternal Grandmother     BP 100/60  Pulse 62  Temp(Src) 98.7 F (37.1 C) (Oral)  Ht 5\' 5"  (1.651 m)  Wt 270 lb 14.4 oz (122.879 kg)  BMI 45.08 kg/m2  SpO2 95%   Review of Systems denies hypoglycemia and weight change.     Objective:   Physical Exam VITAL SIGNS:  See vs page GENERAL: no distress SKIN:  Insulin injection sites at the anterior abdomen are normal.     Assessment & Plan:  DM: This insulin  regimen was chosen from multiple options, for its simplicity.  The benefits of glycemic control must be weighed against the risks of hypoglycemia.  The pattern of his reported cbg's suggests he needs an insulin with a faster onset and shorter duration of action.

## 2012-10-16 ENCOUNTER — Ambulatory Visit (AMBULATORY_SURGERY_CENTER): Payer: 59 | Admitting: Gastroenterology

## 2012-10-16 ENCOUNTER — Encounter: Payer: Self-pay | Admitting: Gastroenterology

## 2012-10-16 VITALS — BP 149/75 | HR 73 | Temp 98.3°F | Resp 17 | Ht 65.0 in | Wt 277.0 lb

## 2012-10-16 DIAGNOSIS — D126 Benign neoplasm of colon, unspecified: Secondary | ICD-10-CM

## 2012-10-16 DIAGNOSIS — Z1211 Encounter for screening for malignant neoplasm of colon: Secondary | ICD-10-CM

## 2012-10-16 LAB — GLUCOSE, CAPILLARY
Glucose-Capillary: 103 mg/dL — ABNORMAL HIGH (ref 70–99)
Glucose-Capillary: 105 mg/dL — ABNORMAL HIGH (ref 70–99)

## 2012-10-16 MED ORDER — SODIUM CHLORIDE 0.9 % IV SOLN: 500.0000 mL | INTRAVENOUS | Status: DC

## 2012-10-16 NOTE — Patient Instructions (Addendum)
Discharge instructions given with verbal understanding. Handout on polyps given. Resume previous medications. YOU HAD AN ENDOSCOPIC PROCEDURE TODAY AT THE [facility]: Refer to the procedure report that was given to you for any specific questions about what was found during the examination.  If the procedure report does not answer your questions, please call your gastroenterologist to clarify.  If you requested that your care partner not be given the details of your procedure findings, then the procedure report has been included in a sealed envelope for you to review at your convenience later.  YOU SHOULD EXPECT: Some feelings of bloating in the abdomen. Passage of more gas than usual.  Walking can help get rid of the air that was put into your GI tract during the procedure and reduce the bloating. If you had a lower endoscopy (such as a colonoscopy or flexible sigmoidoscopy) you may notice spotting of blood in your stool or on the toilet paper. If you underwent a bowel prep for your procedure, then you may not have a normal bowel movement for a few days.  DIET: Your first meal following the procedure should be a light meal and then it is ok to progress to your normal diet.  A half-sandwich or bowl of soup is an example of a good first meal.  Heavy or fried foods are harder to digest and may make you feel nauseous or bloated.  Likewise meals heavy in dairy and vegetables can cause extra gas to form and this can also increase the bloating.  Drink plenty of fluids but you should avoid alcoholic beverages for 24 hours.  ACTIVITY: Your care partner should take you home directly after the procedure.  You should plan to take it easy, moving slowly for the rest of the day.  You can resume normal activity the day after the procedure however you should NOT DRIVE or use heavy machinery for 24 hours (because of the sedation medicines used during the test).    SYMPTOMS TO REPORT IMMEDIATELY: A  gastroenterologist can be reached at any hour.  During normal business hours, 8:30 AM to 5:00 PM Monday through Friday, call [phone].  After hours and on weekends, please call the GI answering service at [phone] who will take a message and have the physician on call contact you.   Following lower endoscopy (colonoscopy or flexible sigmoidoscopy):  Excessive amounts of blood in the stool  Significant tenderness or worsening of abdominal pains  Swelling of the abdomen that is new, acute  Fever of 100F or higher  FOLLOW UP: If any biopsies were taken you will be contacted by phone or by letter within the next 1-3 weeks.  Call your gastroenterologist if you have not heard about the biopsies in 3 weeks.  Our staff will call the home number listed on your records the next business day following your procedure to check on you and address any questions or concerns that you may have at that time regarding the information given to you following your procedure. This is a courtesy call and so if there is no answer at the home number and we have not heard from you through the emergency physician on call, we will assume that you have returned to your regular daily activities without incident.  SIGNATURES/CONFIDENTIALITY: You and/or your care partner have signed paperwork which will be entered into your electronic medical record.  These signatures attest to the fact that that the information above on your After Visit Summary has   been reviewed and is understood.  Full responsibility of the confidentiality of this discharge information lies with you and/or your care-partner. 

## 2012-10-16 NOTE — Op Note (Addendum)
[facility] [address] [place], [address]   COLONOSCOPY PROCEDURE REPORT  PATIENT: Anthony, Woods  MR#: [id number] BIRTHDATE: [birth year] , 57  yrs. old GENDER: Male ENDOSCOPIST: Louis Meckel, MD REFERRED XB:JYNW Buckner Malta, M.D. PROCEDURE DATE:  10/16/2012 PROCEDURE:   Colonoscopy with snare polypectomy ASA CLASS:   Class II INDICATIONS: high  risk screening. - mother with colon cancer age 56  MEDICATIONS: MAC sedation, administered by CRNA and propofol (Diprivan) 300mg  IV  DESCRIPTION OF PROCEDURE:   After the risks benefits and alternatives of the procedure were thoroughly explained, informed consent was obtained.  A digital rectal exam revealed no abnormalities of the rectum.   The LB [id number]  endoscope was introduced through the anus and advanced to the cecum, which was identified by both the appendix and ileocecal valve. No adverse events experienced.   The quality of the prep was excellent using Suprep  The instrument was then slowly withdrawn as the colon was fully examined.      COLON FINDINGS: A sessile polyp measuring 10 mm in size was found in the sigmoid colon.  A polypectomy was performed with a cold snare. The resection was complete and the polyp tissue was completely retrieved.   The colon mucosa was otherwise normal.  Retroflexed views revealed no abnormalities. The time to cecum=3 minutes 16 seconds.  Withdrawal time=7 minutes 59 seconds.  The scope was withdrawn and the procedure completed. COMPLICATIONS: There were no complications.  ENDOSCOPIC IMPRESSION: 1.   Sessile polyp measuring 10 mm in size was found in the sigmoid colon; polypectomy was performed with a cold snare 2.   The colon mucosa was otherwise normal  RECOMMENDATIONS: Igiven your family history of colon cancer I recommend that you undergo repeat colonoscopy every 5 years   eSigned:  Louis Meckel, MD 10/16/2012 2:57 PM Revised: 10/16/2012 2:57  PM  cc:

## 2012-10-16 NOTE — Progress Notes (Signed)
Patient did not experience any of the following events: a burn prior to discharge; a fall within the facility; wrong site/side/patient/procedure/implant event; or a hospital transfer or hospital admission upon discharge from the facility. (G8907) Patient did not have preoperative order for IV antibiotic SSI prophylaxis. (G8918)  

## 2012-10-16 NOTE — Progress Notes (Signed)
Called to room to assist during endoscopic procedure.  Patient ID and intended procedure confirmed with present staff. Received instructions for my participation in the procedure from the performing physician.  

## 2012-10-17 ENCOUNTER — Telehealth: Payer: Self-pay | Admitting: *Deleted

## 2012-10-17 NOTE — Telephone Encounter (Signed)
  Follow up Call-  Call back number 10/16/2012  Post procedure Call Back phone  # [phone]  Permission to leave phone message Yes     Patient questions:  Message left for pt. To call us if necessary.

## 2012-10-19 ENCOUNTER — Encounter: Payer: Self-pay | Admitting: Gastroenterology

## 2012-11-01 ENCOUNTER — Ambulatory Visit (INDEPENDENT_AMBULATORY_CARE_PROVIDER_SITE_OTHER): Payer: Self-pay | Admitting: Ophthalmology

## 2012-11-10 ENCOUNTER — Ambulatory Visit: Payer: Self-pay | Admitting: Endocrinology

## 2012-11-10 DIAGNOSIS — Z0289 Encounter for other administrative examinations: Secondary | ICD-10-CM

## 2012-11-15 ENCOUNTER — Other Ambulatory Visit: Payer: Self-pay | Admitting: *Deleted

## 2012-11-15 MED ORDER — EPINEPHRINE 0.3 MG/0.3ML IJ SOAJ: 0.3000 mg | Freq: Once | INTRAMUSCULAR | Status: DC

## 2012-12-06 ENCOUNTER — Ambulatory Visit (INDEPENDENT_AMBULATORY_CARE_PROVIDER_SITE_OTHER): Payer: Self-pay | Admitting: Ophthalmology

## 2012-12-16 ENCOUNTER — Other Ambulatory Visit: Payer: Self-pay | Admitting: Internal Medicine

## 2012-12-22 MED ORDER — PHENTERMINE HCL 37.5 MG PO TABS: ORAL_TABLET | ORAL | Status: DC

## 2012-12-22 NOTE — Addendum Note (Signed)
Addended by: Deatra James on: 12/22/2012 09:40 AM   Modules accepted: Orders

## 2012-12-22 NOTE — Telephone Encounter (Signed)
Called pt no answer (home) LMOM rx for phentermine has to be pick up @ the office. All other meds has been sent back to his pharmacy...Raechel Chute

## 2012-12-22 NOTE — Telephone Encounter (Signed)
MD states to call in. Can not call in phentermine. Reprinted pt has to pick up rx...lmb

## 2013-01-25 ENCOUNTER — Other Ambulatory Visit: Payer: Self-pay

## 2013-02-06 ENCOUNTER — Ambulatory Visit (INDEPENDENT_AMBULATORY_CARE_PROVIDER_SITE_OTHER): Payer: 59 | Admitting: Internal Medicine

## 2013-02-06 ENCOUNTER — Other Ambulatory Visit (INDEPENDENT_AMBULATORY_CARE_PROVIDER_SITE_OTHER): Payer: 59

## 2013-02-06 ENCOUNTER — Encounter: Payer: Self-pay | Admitting: Internal Medicine

## 2013-02-06 VITALS — BP 158/90 | HR 76 | Temp 97.8°F | Resp 16 | Wt 273.0 lb

## 2013-02-06 DIAGNOSIS — I2510 Atherosclerotic heart disease of native coronary artery without angina pectoris: Secondary | ICD-10-CM

## 2013-02-06 DIAGNOSIS — E119 Type 2 diabetes mellitus without complications: Secondary | ICD-10-CM

## 2013-02-06 DIAGNOSIS — I10 Essential (primary) hypertension: Secondary | ICD-10-CM

## 2013-02-06 DIAGNOSIS — E039 Hypothyroidism, unspecified: Secondary | ICD-10-CM

## 2013-02-06 DIAGNOSIS — Z23 Encounter for immunization: Secondary | ICD-10-CM

## 2013-02-06 DIAGNOSIS — F17200 Nicotine dependence, unspecified, uncomplicated: Secondary | ICD-10-CM

## 2013-02-06 LAB — BASIC METABOLIC PANEL
BUN: 13 mg/dL (ref 6–23)
Creatinine, Ser: 1 mg/dL (ref 0.4–1.5)
GFR: 86.99 mL/min (ref >60.00)

## 2013-02-06 NOTE — Assessment & Plan Note (Signed)
Same

## 2013-02-06 NOTE — Assessment & Plan Note (Addendum)
Risks associated with treatment/diet noncompliance were discussed. Compliance was encouraged. F/u w/Dr Jannetta Quint Readings from Last 3 Encounters:  02/06/13 273 lb (123.832 kg)  10/16/12 277 lb (125.646 kg)  10/11/12 270 lb 14.4 oz (122.879 kg)

## 2013-02-06 NOTE — Progress Notes (Signed)
Pre visit review using our clinic review tool, if applicable. No additional management support is needed unless otherwise documented below in the visit note. 

## 2013-02-06 NOTE — Assessment & Plan Note (Signed)
Continue with current prescription therapy as reflected on the Med list.  

## 2013-02-06 NOTE — Progress Notes (Signed)
   Subjective:  HPI  The patient presents for a follow-up of  chronic hypertension, chronic dyslipidemia, type 2 diabetes better controlled with medicines/insulin; wt gain, CAD  He cont to smoke 1.5 ppd  Wt Readings from Last 3 Encounters:  02/06/13 273 lb (123.832 kg)  10/16/12 277 lb (125.646 kg)  10/11/12 270 lb 14.4 oz (122.879 kg)   BP Readings from Last 3 Encounters:  02/06/13 158/90  10/16/12 149/75  10/11/12 100/60      Review of Systems  Constitutional: Negative for appetite change, fatigue and unexpected weight change.  HENT: Negative for congestion, nosebleeds, sneezing, sore throat and trouble swallowing.   Eyes: Negative for itching and visual disturbance.  Respiratory: Negative for cough.   Cardiovascular: Negative for chest pain, palpitations and leg swelling.  Gastrointestinal: Negative for nausea, diarrhea, blood in stool and abdominal distention.  Genitourinary: Negative for frequency and hematuria.  Musculoskeletal: Negative for back pain, gait problem, joint swelling and neck pain.  Skin: Negative for rash.  Neurological: Negative for dizziness, tremors, speech difficulty and weakness.  Psychiatric/Behavioral: Negative for sleep disturbance, dysphoric mood and agitation. The patient is not nervous/anxious.        Objective:   Physical Exam  Vitals reviewed. Constitutional: He is oriented to person, place, and time. He appears well-developed.  obese  HENT:  Mouth/Throat: Oropharynx is clear and moist.  Eyes: Conjunctivae are normal. Pupils are equal, round, and reactive to light.  Neck: Normal range of motion. No JVD present. No thyromegaly present.  Cardiovascular: Normal rate, regular rhythm, normal heart sounds and intact distal pulses.  Exam reveals no gallop and no friction rub.   No murmur heard. Pulmonary/Chest: Effort normal and breath sounds normal. No respiratory distress. He has no wheezes. He has no rales. He exhibits no tenderness.   Abdominal: Soft. Bowel sounds are normal. He exhibits no distension and no mass. There is no tenderness. There is no rebound and no guarding.  Musculoskeletal: Normal range of motion. He exhibits edema. He exhibits no tenderness.  Lymphadenopathy:    He has no cervical adenopathy.  Neurological: He is alert and oriented to person, place, and time. He has normal reflexes. No cranial nerve deficit. He exhibits normal muscle tone. Coordination normal.  Skin: Skin is warm and dry. No rash noted.  Psychiatric: He has a normal mood and affect. His behavior is normal. Judgment and thought content normal.    Lab Results  Component Value Date   WBC 9.1 02/01/2012   HGB 15.6 02/01/2012   HCT 47.0 02/01/2012   PLT 175.0 02/01/2012   GLUCOSE 142* 02/01/2012   CHOL 183 01/06/2012   TRIG 122.0 01/06/2012   HDL 44.40 01/06/2012   LDLDIRECT 90.6 06/08/2011   LDLCALC 114* 01/06/2012   ALT 19 01/06/2012   AST 20 01/06/2012   NA 136 02/01/2012   K 3.4* 02/01/2012   CL 96 02/01/2012   CREATININE 1.0 02/01/2012   BUN 14 02/01/2012   CO2 32 02/01/2012   TSH 0.55 06/08/2011   PSA 0.30 06/08/2011   INR 1.0 02/01/2012   HGBA1C 9.5* 05/25/2012   MICROALBUR 0.8 05/25/2012         Assessment & Plan:

## 2013-02-06 NOTE — Assessment & Plan Note (Signed)
Wt Readings from Last 3 Encounters:  02/06/13 273 lb (123.832 kg)  10/16/12 277 lb (125.646 kg)  10/11/12 270 lb 14.4 oz (122.879 kg)

## 2013-02-06 NOTE — Assessment & Plan Note (Signed)
BP Readings from Last 3 Encounters:  02/06/13 158/90  10/16/12 149/75  10/11/12 100/60  Continue with current prescription therapy as reflected on the Med list.

## 2013-02-06 NOTE — Assessment & Plan Note (Signed)
Continue with current prescription therapy as reflected on the Med list. Tobacco discussed

## 2013-02-12 ENCOUNTER — Ambulatory Visit (INDEPENDENT_AMBULATORY_CARE_PROVIDER_SITE_OTHER): Payer: 59 | Admitting: Ophthalmology

## 2013-02-12 ENCOUNTER — Ambulatory Visit: Payer: Self-pay | Admitting: Endocrinology

## 2013-02-12 ENCOUNTER — Encounter: Payer: Self-pay | Admitting: Endocrinology

## 2013-02-12 ENCOUNTER — Ambulatory Visit (INDEPENDENT_AMBULATORY_CARE_PROVIDER_SITE_OTHER): Payer: 59 | Admitting: Endocrinology

## 2013-02-12 VITALS — BP 130/70 | HR 68 | Temp 98.2°F | Resp 16 | Ht 65.0 in | Wt 276.0 lb

## 2013-02-12 DIAGNOSIS — E11319 Type 2 diabetes mellitus with unspecified diabetic retinopathy without macular edema: Secondary | ICD-10-CM

## 2013-02-12 DIAGNOSIS — H2510 Age-related nuclear cataract, unspecified eye: Secondary | ICD-10-CM

## 2013-02-12 DIAGNOSIS — I10 Essential (primary) hypertension: Secondary | ICD-10-CM

## 2013-02-12 DIAGNOSIS — H3530 Unspecified macular degeneration: Secondary | ICD-10-CM

## 2013-02-12 DIAGNOSIS — H43819 Vitreous degeneration, unspecified eye: Secondary | ICD-10-CM

## 2013-02-12 DIAGNOSIS — E119 Type 2 diabetes mellitus without complications: Secondary | ICD-10-CM

## 2013-02-12 DIAGNOSIS — H35719 Central serous chorioretinopathy, unspecified eye: Secondary | ICD-10-CM

## 2013-02-12 DIAGNOSIS — E1139 Type 2 diabetes mellitus with other diabetic ophthalmic complication: Secondary | ICD-10-CM

## 2013-02-12 DIAGNOSIS — H35039 Hypertensive retinopathy, unspecified eye: Secondary | ICD-10-CM

## 2013-02-12 NOTE — Patient Instructions (Addendum)
check your blood sugar 2 times a day.  vary the time of day when you check, between before the 3 meals, and at bedtime.  also check if you have symptoms of your blood sugar being too high or too low.  please keep a record of the readings and bring it to your next appointment here.  please call us sooner if your blood sugar goes below 70, or if you have a lot of readings over 200.   Please come back for a follow-up appointment in 6 weeks.     Please take 150 units each morning, when you don't work. On work days, take just 12 units.  On this type of insulin schedule, you should eat meals on a regular schedule.  If a meal is missed or significantly delayed, your blood sugar could go low.

## 2013-02-12 NOTE — Progress Notes (Signed)
Subjective:    Patient ID: Anthony Woods, male    DOB: 12/08/1955, 57 y.o.   MRN: [id number]  HPI Pt returns for f/u of IDDM (dx'ed 1998, on a routine blood test; he has little if any neuropathy of the lower extremities; he has associated CAD; he has never had severe hypoglycemia or DKA; therapy has been limited by pt's need for a simple regimen; he was changed to NPH, due to the pattern of his cbg's).  He has mild hypoglycemia in the afternoon, approx once per month.  no cbg record, but states cbg's vary from 47-300.  It is lowest on work days, and when he eats a small lunch.   Past Medical History  Diagnosis Date  . CAD (coronary artery disease)   . Type II or unspecified type diabetes mellitus without mention of complication, not stated as uncontrolled   . HTN (hypertension)   . Hypothyroidism   . Allergy to bee sting   . Macular degeneration   . Hyperlipidemia   . Obesity     Past Surgical History  Procedure Laterality Date  . Knee surgery      Meniscus removal  . Wrist ganglion excision      left    History   Social History  . Marital Status: Married    Spouse Name: N/A    Number of Children: 1  . Years of Education: N/A   Occupational History  . [employer]   . 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 Smoking status: Current Every Day Smoker -- 2.00 packs/day for 40 years    Types: Cigarettes  . Smokeless tobacco: Never Used  . Alcohol Use: No  . Drug Use: No  . Sexual Activity: Yes   Other Topics Concern  . Not on file   Social History Narrative  . No narrative on file    Current Outpatient Prescriptions on File Prior to Visit  Medication Sig Dispense Refill  . aspirin 81 MG EC tablet Take 81 mg by mouth daily.        Marland Kitchen atenolol-chlorthalidone (TENORETIC) 100-25 MG per tablet TAKE 1 TABLET BY MOUTH DAILY.  30 tablet  5  . EPINEPHrine (EPIPEN 2-PAK) 0.3 mg/0.3 mL SOAJ injection Inject 0.3 mLs (0.3 mg total) into the muscle once.  2 Device  1  .  furosemide (LASIX) 40 MG tablet Take 1 tablet (40 mg total) by mouth daily.  30 tablet  11  . glucagon (GLUCAGON EMERGENCY) 1 MG injection Inject 1 mg into the vein once as needed.  1 each  4  . glucose blood (ONE TOUCH ULTRA TEST) test strip 1 each by Other route 2 (two) times daily. Use as instructed  100 each  6  . Insulin Isophane Human (HUMULIN N KWIKPEN) 100 UNIT/ML SUPN Inject 150 Units into the skin every morning.  20 pen  11  . Lancets (ONETOUCH ULTRASOFT) lancets TEST TWO TIMES A DAY  100 each  5  . levothyroxine (SYNTHROID, LEVOTHROID) 175 MCG tablet Take 1 tablet (175 mcg total) by mouth daily.  90 tablet  4  . losartan (COZAAR) 100 MG tablet Take 1 tablet (100 mg total) by mouth daily.  90 tablet  3  . ONE TOUCH ULTRA TEST test strip TEST TWO TIMES A DAY  100 each  6  . phentermine (ADIPEX-P) 37.5 MG tablet TAKE 1 TABLET BY MOUTH EVERY MORNING BEFORE BREAKFAST.  30 tablet  2  . predniSONE (  DELTASONE) 10 MG tablet Take 60 mg by mouth as needed. For bee stings      . simvastatin (ZOCOR) 40 MG tablet TAKE 1 TABLET BY MOUTH DAILY.  30 tablet  5  . triamcinolone cream (KENALOG) 0.5 % Apply topically 3 (three) times daily. For rash  45 g  1   No current facility-administered medications on file prior to visit.    Allergies  Allergen Reactions  . Actos [Pioglitazone Hydrochloride]     swelling  . Bee Venom Itching  . Lisinopril     cough    Family History  Problem Relation Age of Onset  . Hypertension Other   . Diabetes Father   . Cancer Father     stomach  . CVA Father   . Stomach cancer Father   . Diabetes Mother   . Cancer Mother     male ca  . Colon cancer Mother 57  . Diabetes Other   . CAD Maternal Grandmother    BP 130/70  Pulse 68  Temp(Src) 98.2 F (36.8 C) (Oral)  Resp 16  Ht 5\' 5"  (1.651 m)  Wt 276 lb (125.193 kg)  BMI 45.93 kg/m2  Review of Systems Denies LOC and weight change.      Objective:   Physical Exam VITAL SIGNS:  See vs  page GENERAL: no distress PSYCH: Alert and oriented x 3.  Does not appear anxious nor depressed.  Lab Results  Component Value Date   HGBA1C 11.5* 02/06/2013      Assessment & Plan:  DM: This insulin regimen was chosen from multiple options, for its simplicity.  The benefits of glycemic control must be weighed against the risks of hypoglycemia.  The pattern of his reported cbg's suggests he needs an insulin with a faster onset and shorter duration of action, than lantus or levemir. Occupational situation: in this context, he needs a variable insulin dosage.

## 2013-03-16 ENCOUNTER — Other Ambulatory Visit: Payer: Self-pay | Admitting: Cardiovascular Disease

## 2013-03-26 ENCOUNTER — Encounter: Payer: Self-pay | Admitting: Endocrinology

## 2013-03-26 ENCOUNTER — Ambulatory Visit (INDEPENDENT_AMBULATORY_CARE_PROVIDER_SITE_OTHER): Payer: 59 | Admitting: Endocrinology

## 2013-03-26 VITALS — BP 122/74 | HR 70 | Temp 98.2°F | Ht 65.0 in | Wt 281.0 lb

## 2013-03-26 DIAGNOSIS — E119 Type 2 diabetes mellitus without complications: Secondary | ICD-10-CM

## 2013-03-26 LAB — HEMOGLOBIN A1C: Hgb A1c MFr Bld: 12.1 % — ABNORMAL HIGH (ref 4.6–6.5)

## 2013-03-26 NOTE — Patient Instructions (Addendum)
check your blood sugar 2 times a day.  vary the time of day when you check, between before the 3 meals, and at bedtime.  also check if you have symptoms of your blood sugar being too high or too low.  please keep a record of the readings and bring it to your next appointment here.  please call us sooner if your blood sugar goes below 70, or if you have a lot of readings over 200.   Please come back for a follow-up appointment in 3 months.  blood tests are being requested for you today.  We'll contact you with results.    Please take 150 units each morning, when you don't work. On work days, take just 120 units.  On this type of insulin schedule, you should eat meals on a regular schedule.  If a meal is missed or significantly delayed, your blood sugar could go low.

## 2013-03-26 NOTE — Progress Notes (Signed)
Subjective:    Patient ID: Anthony Woods, male    DOB: 04-13-1955, 58 y.o.   MRN: [id number]  HPI Pt returns for f/u of IDDM (dx'ed 1998, on a routine blood test; he has little if any neuropathy of the lower extremities; he has associated CAD; he has never had severe hypoglycemia or DKA; therapy has been limited by pt's need for a simple regimen; he was changed to NPH, due to the pattern of his cbg's).  He says he never misses the insulin.  He still has mild hypoglycemia in the afternoon, approx once per month.  This happens on work days only.  no cbg record, but states cbg's vary widely.  It is lowest on work days, and when he eats a small breakfast and/or lunch.   Past Medical History  Diagnosis Date  . CAD (coronary artery disease)   . Type II or unspecified type diabetes mellitus without mention of complication, not stated as uncontrolled   . HTN (hypertension)   . Hypothyroidism   . Allergy to bee sting   . Macular degeneration   . Hyperlipidemia   . Obesity     Past Surgical History  Procedure Laterality Date  . Knee surgery      Meniscus removal  . Wrist ganglion excision      left    History   Social History  . Marital Status: Married    Spouse Name: N/A    Number of Children: 1  . Years of Education: N/A   Occupational History  . [employer]   . 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 Smoking status: Current Every Day Smoker -- 2.00 packs/day for 40 years    Types: Cigarettes  . Smokeless tobacco: Never Used  . Alcohol Use: No  . Drug Use: No  . Sexual Activity: Yes   Other Topics Concern  . Not on file   Social History Narrative  . No narrative on file    Current Outpatient Prescriptions on File Prior to Visit  Medication Sig Dispense Refill  . aspirin 81 MG EC tablet Take 81 mg by mouth daily.        Marland Kitchen atenolol-chlorthalidone (TENORETIC) 100-25 MG per tablet TAKE 1 TABLET BY MOUTH DAILY.  30 tablet  5  . EPINEPHrine (EPIPEN 2-PAK) 0.3  mg/0.3 mL SOAJ injection Inject 0.3 mLs (0.3 mg total) into the muscle once.  2 Device  1  . furosemide (LASIX) 40 MG tablet TAKE 1 TABLET BY MOUTH DAILY.  30 tablet  0  . glucagon (GLUCAGON EMERGENCY) 1 MG injection Inject 1 mg into the vein once as needed.  1 each  4  . glucose blood (ONE TOUCH ULTRA TEST) test strip 1 each by Other route 2 (two) times daily. Use as instructed  100 each  6  . Lancets (ONETOUCH ULTRASOFT) lancets TEST TWO TIMES A DAY  100 each  5  . levothyroxine (SYNTHROID, LEVOTHROID) 175 MCG tablet Take 1 tablet (175 mcg total) by mouth daily.  90 tablet  4  . losartan (COZAAR) 100 MG tablet Take 1 tablet (100 mg total) by mouth daily.  90 tablet  3  . ONE TOUCH ULTRA TEST test strip TEST TWO TIMES A DAY  100 each  6  . phentermine (ADIPEX-P) 37.5 MG tablet TAKE 1 TABLET BY MOUTH EVERY MORNING BEFORE BREAKFAST.  30 tablet  2  . predniSONE (DELTASONE) 10 MG tablet Take 60 mg by mouth  as needed. For bee stings      . simvastatin (ZOCOR) 40 MG tablet TAKE 1 TABLET BY MOUTH DAILY.  30 tablet  5  . triamcinolone cream (KENALOG) 0.5 % Apply topically 3 (three) times daily. For rash  45 g  1   No current facility-administered medications on file prior to visit.    Allergies  Allergen Reactions  . Actos [Pioglitazone Hydrochloride]     swelling  . Bee Venom Itching  . Lisinopril     cough    Family History  Problem Relation Age of Onset  . Hypertension Other   . Diabetes Father   . Cancer Father     stomach  . CVA Father   . Stomach cancer Father   . Diabetes Mother   . Cancer Mother     male ca  . Colon cancer Mother 85  . Diabetes Other   . CAD Maternal Grandmother    BP 122/74  Pulse 70  Temp(Src) 98.2 F (36.8 C) (Oral)  Ht 5\' 5"  (1.651 m)  Wt 281 lb (127.461 kg)  BMI 46.76 kg/m2  SpO2 94%  Review of Systems Denies LOC and weight change.      Objective:   Physical Exam VITAL SIGNS:  See vs page GENERAL: no distress  Lab Results  Component  Value Date   HGBA1C 12.1* 03/26/2013      Assessment & Plan:  DM: very poor control is usually due to noncompliance.  The best available option is to increase insulin based on a1c values.   Occupational situation: in this context, he needs a variable insulin dosage.

## 2013-03-30 ENCOUNTER — Other Ambulatory Visit: Payer: Self-pay | Admitting: Cardiovascular Disease

## 2013-04-02 ENCOUNTER — Telehealth: Payer: Self-pay | Admitting: *Deleted

## 2013-04-02 NOTE — Telephone Encounter (Signed)
OK to fill this prescription with additional refills x1 Thank you!  

## 2013-04-02 NOTE — Telephone Encounter (Signed)
Refill request for phentermine 37.5mg .  Last OV 11.18.14 Last filled 10.3.14

## 2013-04-03 ENCOUNTER — Other Ambulatory Visit: Payer: Self-pay | Admitting: Internal Medicine

## 2013-04-03 MED ORDER — PHENTERMINE HCL 37.5 MG PO TABS: ORAL_TABLET | ORAL | Status: DC

## 2013-04-03 NOTE — Telephone Encounter (Signed)
Refill done.  

## 2013-05-09 ENCOUNTER — Ambulatory Visit: Payer: Self-pay | Admitting: Internal Medicine

## 2013-05-24 ENCOUNTER — Other Ambulatory Visit (INDEPENDENT_AMBULATORY_CARE_PROVIDER_SITE_OTHER): Payer: 59

## 2013-05-24 ENCOUNTER — Encounter: Payer: Self-pay | Admitting: Internal Medicine

## 2013-05-24 ENCOUNTER — Ambulatory Visit (INDEPENDENT_AMBULATORY_CARE_PROVIDER_SITE_OTHER): Payer: 59 | Admitting: Internal Medicine

## 2013-05-24 VITALS — BP 110/70 | HR 76 | Temp 98.4°F | Resp 16 | Wt 282.0 lb

## 2013-05-24 DIAGNOSIS — E119 Type 2 diabetes mellitus without complications: Secondary | ICD-10-CM

## 2013-05-24 DIAGNOSIS — Z23 Encounter for immunization: Secondary | ICD-10-CM

## 2013-05-24 DIAGNOSIS — I10 Essential (primary) hypertension: Secondary | ICD-10-CM

## 2013-05-24 LAB — BASIC METABOLIC PANEL
BUN: 16 mg/dL (ref 6–23)
CO2: 31 mEq/L (ref 19–32)
Calcium: 10.2 mg/dL (ref 8.4–10.5)
Chloride: 94 mEq/L — ABNORMAL LOW (ref 96–112)
Creatinine, Ser: 1.1 mg/dL (ref 0.4–1.5)
GFR: 73.37 mL/min (ref >60.00)
GLUCOSE: 351 mg/dL — AB (ref 70–99)
POTASSIUM: 3.3 meq/L — AB (ref 3.5–5.1)
SODIUM: 133 meq/L — AB (ref 135–145)

## 2013-05-24 NOTE — Progress Notes (Signed)
   Subjective:  HPI  The patient presents for a follow-up of  chronic hypertension, chronic dyslipidemia, type 2 diabetes better controlled with medicines/insulin; wt gain, CAD  He cont to smoke 1.5 ppd  Wt Readings from Last 3 Encounters:  05/24/13 282 lb (127.914 kg)  03/26/13 281 lb (127.461 kg)  02/12/13 276 lb (125.193 kg)   BP Readings from Last 3 Encounters:  05/24/13 110/70  03/26/13 122/74  02/12/13 130/70      Review of Systems  Constitutional: Negative for appetite change, fatigue and unexpected weight change.  HENT: Negative for congestion, nosebleeds, sneezing, sore throat and trouble swallowing.   Eyes: Negative for itching and visual disturbance.  Respiratory: Negative for cough.   Cardiovascular: Negative for chest pain, palpitations and leg swelling.  Gastrointestinal: Negative for nausea, diarrhea, blood in stool and abdominal distention.  Genitourinary: Negative for frequency and hematuria.  Musculoskeletal: Negative for back pain, gait problem, joint swelling and neck pain.  Skin: Negative for rash.  Neurological: Negative for dizziness, tremors, speech difficulty and weakness.  Psychiatric/Behavioral: Negative for sleep disturbance, dysphoric mood and agitation. The patient is not nervous/anxious.        Objective:   Physical Exam  Vitals reviewed. Constitutional: He is oriented to person, place, and time. He appears well-developed.  obese  HENT:  Mouth/Throat: Oropharynx is clear and moist.  Eyes: Conjunctivae are normal. Pupils are equal, round, and reactive to light.  Neck: Normal range of motion. No JVD present. No thyromegaly present.  Cardiovascular: Normal rate, regular rhythm, normal heart sounds and intact distal pulses.  Exam reveals no gallop and no friction rub.   No murmur heard. Pulmonary/Chest: Effort normal and breath sounds normal. No respiratory distress. He has no wheezes. He has no rales. He exhibits no tenderness.  Abdominal:  Soft. Bowel sounds are normal. He exhibits no distension and no mass. There is no tenderness. There is no rebound and no guarding.  Musculoskeletal: Normal range of motion. He exhibits edema. He exhibits no tenderness.  Lymphadenopathy:    He has no cervical adenopathy.  Neurological: He is alert and oriented to person, place, and time. He has normal reflexes. No cranial nerve deficit. He exhibits normal muscle tone. Coordination normal.  Skin: Skin is warm and dry. No rash noted.  Psychiatric: He has a normal mood and affect. His behavior is normal. Judgment and thought content normal.    Lab Results  Component Value Date   WBC 9.1 02/01/2012   HGB 15.6 02/01/2012   HCT 47.0 02/01/2012   PLT 175.0 02/01/2012   GLUCOSE 260* 02/06/2013   CHOL 183 01/06/2012   TRIG 122.0 01/06/2012   HDL 44.40 01/06/2012   LDLDIRECT 90.6 06/08/2011   LDLCALC 114* 01/06/2012   ALT 19 01/06/2012   AST 20 01/06/2012   NA 136 02/06/2013   K 4.0 02/06/2013   CL 99 02/06/2013   CREATININE 1.0 02/06/2013   BUN 13 02/06/2013   CO2 31 02/06/2013   TSH 0.55 06/08/2011   PSA 0.30 06/08/2011   INR 1.0 02/01/2012   HGBA1C 12.1* 03/26/2013   MICROALBUR 0.8 05/25/2012         Assessment & Plan:

## 2013-05-24 NOTE — Assessment & Plan Note (Signed)
Continue with current prescription therapy as reflected on the Med list.  

## 2013-05-24 NOTE — Progress Notes (Signed)
Pre visit review using our clinic review tool, if applicable. No additional management support is needed unless otherwise documented below in the visit note. 

## 2013-05-24 NOTE — Assessment & Plan Note (Signed)
Wt Readings from Last 3 Encounters:  05/24/13 282 lb (127.914 kg)  03/26/13 281 lb (127.461 kg)  02/12/13 276 lb (125.193 kg)

## 2013-05-25 ENCOUNTER — Other Ambulatory Visit (INDEPENDENT_AMBULATORY_CARE_PROVIDER_SITE_OTHER): Payer: 59

## 2013-05-25 DIAGNOSIS — E119 Type 2 diabetes mellitus without complications: Secondary | ICD-10-CM

## 2013-05-25 LAB — HEMOGLOBIN A1C
HEMOGLOBIN A1C: 12.3 % — AB (ref 4.6–6.5)
HEMOGLOBIN A1C: 12.2 % — AB (ref 4.6–6.5)

## 2013-06-12 ENCOUNTER — Other Ambulatory Visit: Payer: Self-pay | Admitting: Internal Medicine

## 2013-06-28 ENCOUNTER — Other Ambulatory Visit: Payer: Self-pay | Admitting: Endocrinology

## 2013-07-02 ENCOUNTER — Ambulatory Visit: Payer: Self-pay | Admitting: Endocrinology

## 2013-07-18 ENCOUNTER — Other Ambulatory Visit: Payer: Self-pay | Admitting: Internal Medicine

## 2013-07-18 ENCOUNTER — Other Ambulatory Visit: Payer: Self-pay | Admitting: Endocrinology

## 2013-07-18 ENCOUNTER — Other Ambulatory Visit: Payer: Self-pay | Admitting: *Deleted

## 2013-07-18 MED ORDER — GLUCOSE BLOOD VI STRP: ORAL_STRIP | Status: DC

## 2013-08-14 ENCOUNTER — Ambulatory Visit (INDEPENDENT_AMBULATORY_CARE_PROVIDER_SITE_OTHER): Payer: 59 | Admitting: Ophthalmology

## 2013-08-14 DIAGNOSIS — E1139 Type 2 diabetes mellitus with other diabetic ophthalmic complication: Secondary | ICD-10-CM

## 2013-08-14 DIAGNOSIS — H2510 Age-related nuclear cataract, unspecified eye: Secondary | ICD-10-CM

## 2013-08-14 DIAGNOSIS — E1165 Type 2 diabetes mellitus with hyperglycemia: Secondary | ICD-10-CM

## 2013-08-14 DIAGNOSIS — H35039 Hypertensive retinopathy, unspecified eye: Secondary | ICD-10-CM

## 2013-08-14 DIAGNOSIS — H43819 Vitreous degeneration, unspecified eye: Secondary | ICD-10-CM

## 2013-08-14 DIAGNOSIS — E11319 Type 2 diabetes mellitus with unspecified diabetic retinopathy without macular edema: Secondary | ICD-10-CM

## 2013-08-14 DIAGNOSIS — H3530 Unspecified macular degeneration: Secondary | ICD-10-CM

## 2013-08-14 DIAGNOSIS — I10 Essential (primary) hypertension: Secondary | ICD-10-CM

## 2013-09-20 ENCOUNTER — Telehealth: Payer: Self-pay | Admitting: *Deleted

## 2013-09-20 DIAGNOSIS — E119 Type 2 diabetes mellitus without complications: Secondary | ICD-10-CM

## 2013-09-20 NOTE — Telephone Encounter (Signed)
Left message on machine for patient reminding him of upcoming appointment and to come fasting. Lipid ordered.

## 2013-09-25 ENCOUNTER — Encounter: Payer: Self-pay | Admitting: Internal Medicine

## 2013-09-25 ENCOUNTER — Ambulatory Visit (INDEPENDENT_AMBULATORY_CARE_PROVIDER_SITE_OTHER): Payer: 59 | Admitting: *Deleted

## 2013-09-25 ENCOUNTER — Ambulatory Visit (INDEPENDENT_AMBULATORY_CARE_PROVIDER_SITE_OTHER): Payer: 59 | Admitting: Internal Medicine

## 2013-09-25 ENCOUNTER — Telehealth: Payer: Self-pay | Admitting: Internal Medicine

## 2013-09-25 VITALS — BP 130/76 | HR 76 | Temp 99.3°F | Resp 16 | Wt 273.0 lb

## 2013-09-25 DIAGNOSIS — I2510 Atherosclerotic heart disease of native coronary artery without angina pectoris: Secondary | ICD-10-CM

## 2013-09-25 DIAGNOSIS — E039 Hypothyroidism, unspecified: Secondary | ICD-10-CM

## 2013-09-25 DIAGNOSIS — R0683 Snoring: Secondary | ICD-10-CM

## 2013-09-25 DIAGNOSIS — R0989 Other specified symptoms and signs involving the circulatory and respiratory systems: Secondary | ICD-10-CM

## 2013-09-25 DIAGNOSIS — F17200 Nicotine dependence, unspecified, uncomplicated: Secondary | ICD-10-CM

## 2013-09-25 DIAGNOSIS — E119 Type 2 diabetes mellitus without complications: Secondary | ICD-10-CM

## 2013-09-25 DIAGNOSIS — I10 Essential (primary) hypertension: Secondary | ICD-10-CM

## 2013-09-25 DIAGNOSIS — R0609 Other forms of dyspnea: Secondary | ICD-10-CM

## 2013-09-25 LAB — BASIC METABOLIC PANEL
BUN: 16 mg/dL (ref 6–23)
CALCIUM: 9.7 mg/dL (ref 8.4–10.5)
CO2: 38 meq/L — AB (ref 19–32)
CREATININE: 1 mg/dL (ref 0.4–1.5)
Chloride: 97 mEq/L (ref 96–112)
GFR: 82.76 mL/min (ref >60.00)
Glucose, Bld: 264 mg/dL — ABNORMAL HIGH (ref 70–99)
Potassium: 3 mEq/L — ABNORMAL LOW (ref 3.5–5.1)
Sodium: 137 mEq/L (ref 135–145)

## 2013-09-25 LAB — HEMOGLOBIN A1C: Hgb A1c MFr Bld: 13 % — ABNORMAL HIGH (ref 4.6–6.5)

## 2013-09-25 NOTE — Assessment & Plan Note (Signed)
Continue with current prescription therapy as reflected on the Med list.  

## 2013-09-25 NOTE — Assessment & Plan Note (Signed)
Sleep test Cont w/wt loss

## 2013-09-25 NOTE — Progress Notes (Signed)
   Subjective:  HPI  The patient presents for a follow-up of  chronic hypertension, chronic dyslipidemia, type 2 diabetes better controlled with medicines/insulin; wt gain, CAD  Theran doesn't sleep well...  He cont to smoke 1.5-2 ppd  Wt Readings from Last 3 Encounters:  09/25/13 273 lb (123.832 kg)  05/24/13 282 lb (127.914 kg)  03/26/13 281 lb (127.461 kg)   BP Readings from Last 3 Encounters:  09/25/13 130/76  05/24/13 110/70  03/26/13 122/74      Review of Systems  Constitutional: Negative for appetite change, fatigue and unexpected weight change.  HENT: Negative for congestion, nosebleeds, sneezing, sore throat and trouble swallowing.   Eyes: Negative for itching and visual disturbance.  Respiratory: Negative for cough.   Cardiovascular: Negative for chest pain, palpitations and leg swelling.  Gastrointestinal: Negative for nausea, diarrhea, blood in stool and abdominal distention.  Genitourinary: Negative for frequency and hematuria.  Musculoskeletal: Negative for back pain, gait problem, joint swelling and neck pain.  Skin: Negative for rash.  Neurological: Negative for dizziness, tremors, speech difficulty and weakness.  Psychiatric/Behavioral: Negative for sleep disturbance, dysphoric mood and agitation. The patient is not nervous/anxious.        Objective:   Physical Exam  Vitals reviewed. Constitutional: He is oriented to person, place, and time. He appears well-developed.  obese  HENT:  Mouth/Throat: Oropharynx is clear and moist.  Eyes: Conjunctivae are normal. Pupils are equal, round, and reactive to light.  Neck: Normal range of motion. No JVD present. No thyromegaly present.  Cardiovascular: Normal rate, regular rhythm, normal heart sounds and intact distal pulses.  Exam reveals no gallop and no friction rub.   No murmur heard. Pulmonary/Chest: Effort normal and breath sounds normal. No respiratory distress. He has no wheezes. He has no rales. He  exhibits no tenderness.  Abdominal: Soft. Bowel sounds are normal. He exhibits no distension and no mass. There is no tenderness. There is no rebound and no guarding.  Musculoskeletal: Normal range of motion. He exhibits edema. He exhibits no tenderness.  Lymphadenopathy:    He has no cervical adenopathy.  Neurological: He is alert and oriented to person, place, and time. He has normal reflexes. No cranial nerve deficit. He exhibits normal muscle tone. Coordination normal.  Skin: Skin is warm and dry. No rash noted.  Psychiatric: He has a normal mood and affect. His behavior is normal. Judgment and thought content normal.    Lab Results  Component Value Date   WBC 9.1 02/01/2012   HGB 15.6 02/01/2012   HCT 47.0 02/01/2012   PLT 175.0 02/01/2012   GLUCOSE 351* 05/24/2013   CHOL 183 01/06/2012   TRIG 122.0 01/06/2012   HDL 44.40 01/06/2012   LDLDIRECT 90.6 06/08/2011   LDLCALC 114* 01/06/2012   ALT 19 01/06/2012   AST 20 01/06/2012   NA 133* 05/24/2013   K 3.3* 05/24/2013   CL 94* 05/24/2013   CREATININE 1.1 05/24/2013   BUN 16 05/24/2013   CO2 31 05/24/2013   TSH 0.55 06/08/2011   PSA 0.30 06/08/2011   INR 1.0 02/01/2012   HGBA1C 12.2* 05/25/2013   MICROALBUR 0.8 05/25/2012         Assessment & Plan:

## 2013-09-25 NOTE — Assessment & Plan Note (Signed)
7/15 - 1.5-2 ppd discussed

## 2013-09-25 NOTE — Progress Notes (Signed)
Pre visit review using our clinic review tool, if applicable. No additional management support is needed unless otherwise documented below in the visit note. 

## 2013-09-25 NOTE — Assessment & Plan Note (Signed)
Better  

## 2013-09-25 NOTE — Telephone Encounter (Signed)
Relevant patient education mailed to patient.  

## 2013-09-26 ENCOUNTER — Telehealth: Payer: Self-pay | Admitting: Internal Medicine

## 2013-09-26 MED ORDER — POTASSIUM CHLORIDE ER 10 MEQ PO TBCR: 10.0000 meq | EXTENDED_RELEASE_TABLET | Freq: Two times a day (BID) | ORAL | Status: DC

## 2013-09-26 NOTE — Telephone Encounter (Signed)
Relevant patient education assigned to patient using Emmi. ° °

## 2013-10-11 ENCOUNTER — Emergency Department (HOSPITAL_COMMUNITY)
Admission: EM | Admit: 2013-10-11 | Discharge: 2013-10-11 | Disposition: A | Discharge status: Home / Self Care | Payer: Worker's Compensation | Attending: Emergency Medicine | Admitting: Emergency Medicine

## 2013-10-11 ENCOUNTER — Encounter (HOSPITAL_COMMUNITY): Payer: Self-pay | Admitting: Emergency Medicine

## 2013-10-11 DIAGNOSIS — I2510 Atherosclerotic heart disease of native coronary artery without angina pectoris: Secondary | ICD-10-CM | POA: Insufficient documentation

## 2013-10-11 DIAGNOSIS — Z7982 Long term (current) use of aspirin: Secondary | ICD-10-CM | POA: Insufficient documentation

## 2013-10-11 DIAGNOSIS — Z794 Long term (current) use of insulin: Secondary | ICD-10-CM | POA: Diagnosis not present

## 2013-10-11 DIAGNOSIS — E119 Type 2 diabetes mellitus without complications: Secondary | ICD-10-CM | POA: Insufficient documentation

## 2013-10-11 DIAGNOSIS — T63461A Toxic effect of venom of wasps, accidental (unintentional), initial encounter: Secondary | ICD-10-CM | POA: Insufficient documentation

## 2013-10-11 DIAGNOSIS — T63481A Toxic effect of venom of other arthropod, accidental (unintentional), initial encounter: Secondary | ICD-10-CM

## 2013-10-11 DIAGNOSIS — E039 Hypothyroidism, unspecified: Secondary | ICD-10-CM | POA: Insufficient documentation

## 2013-10-11 DIAGNOSIS — Z79899 Other long term (current) drug therapy: Secondary | ICD-10-CM | POA: Insufficient documentation

## 2013-10-11 DIAGNOSIS — E669 Obesity, unspecified: Secondary | ICD-10-CM | POA: Insufficient documentation

## 2013-10-11 DIAGNOSIS — R0602 Shortness of breath: Secondary | ICD-10-CM | POA: Insufficient documentation

## 2013-10-11 DIAGNOSIS — F17200 Nicotine dependence, unspecified, uncomplicated: Secondary | ICD-10-CM | POA: Insufficient documentation

## 2013-10-11 DIAGNOSIS — T6391XA Toxic effect of contact with unspecified venomous animal, accidental (unintentional), initial encounter: Secondary | ICD-10-CM | POA: Insufficient documentation

## 2013-10-11 DIAGNOSIS — E785 Hyperlipidemia, unspecified: Secondary | ICD-10-CM | POA: Diagnosis not present

## 2013-10-11 DIAGNOSIS — Y9289 Other specified places as the place of occurrence of the external cause: Secondary | ICD-10-CM | POA: Diagnosis not present

## 2013-10-11 DIAGNOSIS — T782XXA Anaphylactic shock, unspecified, initial encounter: Secondary | ICD-10-CM

## 2013-10-11 DIAGNOSIS — I10 Essential (primary) hypertension: Secondary | ICD-10-CM | POA: Diagnosis not present

## 2013-10-11 DIAGNOSIS — Y9389 Activity, other specified: Secondary | ICD-10-CM | POA: Insufficient documentation

## 2013-10-11 DIAGNOSIS — IMO0002 Reserved for concepts with insufficient information to code with codable children: Secondary | ICD-10-CM | POA: Insufficient documentation

## 2013-10-11 MED ORDER — METHYLPREDNISOLONE SODIUM SUCC 125 MG IJ SOLR
125.0000 mg | Freq: Once | INTRAMUSCULAR | Status: AC
  Administered 2013-10-11: 125 mg via INTRAVENOUS
  Filled 2013-10-11: qty 2

## 2013-10-11 MED ORDER — DIPHENHYDRAMINE HCL 50 MG/ML IJ SOLN
25.0000 mg | Freq: Once | INTRAMUSCULAR | Status: AC
  Administered 2013-10-11: 25 mg via INTRAVENOUS
  Filled 2013-10-11: qty 1

## 2013-10-11 MED ORDER — DIPHENHYDRAMINE HCL 25 MG PO TABS: 25.0000 mg | ORAL_TABLET | Freq: Four times a day (QID) | ORAL | Status: DC

## 2013-10-11 MED ORDER — FAMOTIDINE 20 MG PO TABS: 20.0000 mg | ORAL_TABLET | Freq: Two times a day (BID) | ORAL | Status: DC

## 2013-10-11 MED ORDER — FAMOTIDINE IN NACL 20-0.9 MG/50ML-% IV SOLN
20.0000 mg | Freq: Once | INTRAVENOUS | Status: AC
  Administered 2013-10-11: 20 mg via INTRAVENOUS
  Filled 2013-10-11: qty 50

## 2013-10-11 MED ORDER — ALBUTEROL SULFATE (2.5 MG/3ML) 0.083% IN NEBU
5.0000 mg | INHALATION_SOLUTION | Freq: Once | RESPIRATORY_TRACT | Status: AC
Start: 2013-10-11 — End: 2013-10-11
  Administered 2013-10-11: 5 mg via RESPIRATORY_TRACT
  Filled 2013-10-11: qty 6

## 2013-10-11 MED ORDER — EPINEPHRINE 0.3 MG/0.3ML IJ SOAJ: 0.3000 mg | INTRAMUSCULAR | Status: DC | PRN

## 2013-10-11 MED ORDER — EPINEPHRINE 0.3 MG/0.3ML IJ SOAJ
0.3000 mg | Freq: Once | INTRAMUSCULAR | Status: AC
  Administered 2013-10-11: 0.3 mg via INTRAMUSCULAR
  Filled 2013-10-11: qty 0.3

## 2013-10-11 MED ORDER — IPRATROPIUM-ALBUTEROL 0.5-2.5 (3) MG/3ML IN SOLN
3.0000 mL | Freq: Once | RESPIRATORY_TRACT | Status: AC
  Administered 2013-10-11: 3 mL via RESPIRATORY_TRACT
  Filled 2013-10-11: qty 3

## 2013-10-11 MED ORDER — PREDNISONE 20 MG PO TABS: 60.0000 mg | ORAL_TABLET | Freq: Every day | ORAL | Status: DC

## 2013-10-11 MED ORDER — SODIUM CHLORIDE 0.9 % IV SOLN: 1000.0000 mL | INTRAVENOUS | Status: DC

## 2013-10-11 MED ORDER — SODIUM CHLORIDE 0.9 % IV SOLN
1000.0000 mL | Freq: Once | INTRAVENOUS | Status: AC
  Administered 2013-10-11: 1000 mL via INTRAVENOUS

## 2013-10-11 NOTE — ED Provider Notes (Signed)
CSN: [id number]     Arrival date & time 10/11/13  1324 History   First MD Initiated Contact with Patient 10/11/13 1337     Chief Complaint  Patient presents with  . Allergic Reaction     (Consider location/radiation/quality/duration/timing/severity/associated sxs/prior Treatment) Patient is a 58 y.o. male presenting with allergic reaction. The history is provided by the patient. No language interpreter was used.  Allergic Reaction Presenting symptoms: difficulty breathing   Presenting symptoms: no difficulty swallowing and no rash   Severity:  Moderate Prior allergic episodes:  Insect allergies Context: insect bite/sting   Context comment:  3 yellow jacket stings Relieved by:  Nothing Worsened by:  Nothing tried Ineffective treatments:  Epinephrine   Past Medical History  Diagnosis Date  . CAD (coronary artery disease)   . Type II or unspecified type diabetes mellitus without mention of complication, not stated as uncontrolled   . HTN (hypertension)   . Hypothyroidism   . Allergy to bee sting   . Macular degeneration   . Hyperlipidemia   . Obesity    Past Surgical History  Procedure Laterality Date  . Knee surgery      Meniscus removal  . Wrist ganglion excision      left   Family History  Problem Relation Age of Onset  . Hypertension Other   . Diabetes Father   . Cancer Father     stomach  . CVA Father   . Stomach cancer Father   . Diabetes Mother   . Cancer Mother     male ca  . Colon cancer Mother 67  . Diabetes Other   . CAD Maternal Grandmother    History  Substance Use Topics  . Smoking status: Current Every Day Smoker -- 2.00 packs/day for 40 years    Types: Cigarettes  . Smokeless tobacco: Never Used  . Alcohol Use: No    Review of Systems  Constitutional: Negative for fever, activity change, appetite change and fatigue.  HENT: Negative for congestion, facial swelling, rhinorrhea and trouble swallowing.   Eyes: Negative for photophobia and  pain.  Respiratory: Positive for shortness of breath. Negative for cough and chest tightness.   Cardiovascular: Negative for chest pain and leg swelling.  Gastrointestinal: Negative for nausea, vomiting, abdominal pain, diarrhea and constipation.  Endocrine: Negative for polydipsia and polyuria.  Genitourinary: Negative for dysuria, urgency, decreased urine volume and difficulty urinating.  Musculoskeletal: Negative for back pain and gait problem.  Skin: Negative for color change, rash and wound.  Allergic/Immunologic: Negative for immunocompromised state.  Neurological: Negative for dizziness, facial asymmetry, speech difficulty, weakness, numbness and headaches.  Psychiatric/Behavioral: Negative for confusion, decreased concentration and agitation.      Allergies  Actos; Bee venom; and Lisinopril  Home Medications   Prior to Admission medications   Medication Sig Start Date End Date Taking? Authorizing Provider  aspirin 81 MG EC tablet Take 81 mg by mouth daily.     Yes Historical Provider, MD  atenolol-chlorthalidone (TENORETIC) 100-25 MG per tablet Take 1 tablet by mouth daily.   Yes Historical Provider, MD  EPINEPHrine (EPIPEN 2-PAK) 0.3 mg/0.3 mL SOAJ injection Inject 0.3 mLs (0.3 mg total) into the muscle once. 11/15/12  Yes [name], MD  furosemide (LASIX) 40 MG tablet Take 40 mg by mouth daily.   Yes Historical Provider, MD  glucagon (GLUCAGON EMERGENCY) 1 MG injection Inject 1 mg into the vein once as needed. 07/13/12  Yes Renato Shin, MD  glucose blood test  strip 1 each by Other route See admin instructions. Check blood sugar 2 times daily.   Yes Historical Provider, MD  Insulin Isophane Human (HUMULIN N) 100 UNIT/ML Kiwkpen Inject 150 Units into the skin every morning.  10/11/12  Yes Renato Shin, MD  Insulin Pen Needle (PEN NEEDLES) 31G X 6 MM MISC 1 each by Other route See admin instructions. Use once daily with insulin pen.   Yes Historical Provider, MD  Lancets  MISC 1 each by Other route See admin instructions. Check blood sugar 2 times daily.   Yes Historical Provider, MD  levothyroxine (SYNTHROID, LEVOTHROID) 175 MCG tablet Take 175 mcg by mouth daily before breakfast.   Yes Historical Provider, MD  losartan (COZAAR) 100 MG tablet Take 100 mg by mouth daily.   Yes Historical Provider, MD  potassium chloride (KLOR-CON 10) 10 MEQ tablet Take 1 tablet (10 mEq total) by mouth 2 (two) times daily. 09/26/13  Yes [name], MD  predniSONE (DELTASONE) 10 MG tablet Take 60 mg by mouth as needed. For bee stings   Yes Historical Provider, MD  simvastatin (ZOCOR) 40 MG tablet Take 40 mg by mouth daily.   Yes Historical Provider, MD  diphenhydrAMINE (BENADRYL) 25 MG tablet Take 1 tablet (25 mg total) by mouth every 6 (six) hours. For 3 days 10/11/13   Neta Ehlers, MD  EPINEPHrine (EPIPEN) 0.3 mg/0.3 mL IJ SOAJ injection Inject 0.3 mLs (0.3 mg total) into the muscle as needed. 10/11/13   Neta Ehlers, MD  famotidine (PEPCID) 20 MG tablet Take 1 tablet (20 mg total) by mouth 2 (two) times daily. For 3 days 10/11/13   Neta Ehlers, MD  predniSONE (DELTASONE) 20 MG tablet Take 3 tablets (60 mg total) by mouth daily. For 3 days 10/11/13   Neta Ehlers, MD   BP 134/65  Pulse 87  Temp(Src) 98.4 F (36.9 C) (Oral)  Resp 21  SpO2 90% Physical Exam  Constitutional: He is oriented to person, place, and time. He appears well-developed and well-nourished. No distress.  HENT:  Head: Normocephalic and atraumatic.  Mouth/Throat: No oropharyngeal exudate.  Eyes: Pupils are equal, round, and reactive to light.  Neck: Normal range of motion. Neck supple.  Cardiovascular: Normal rate, regular rhythm and normal heart sounds.  Exam reveals no gallop and no friction rub.   No murmur heard. Pulmonary/Chest: Effort normal. No respiratory distress. He has wheezes in the right upper field, the right middle field, the right lower field, the left upper field, the  left middle field and the left lower field. He has no rales.  Abdominal: Soft. Bowel sounds are normal. He exhibits no distension and no mass. There is no tenderness. There is no rebound and no guarding.  Musculoskeletal: Normal range of motion. He exhibits no edema and no tenderness.  Neurological: He is alert and oriented to person, place, and time.  Skin: Skin is warm and dry.     Psychiatric: He has a normal mood and affect.    ED Course  Procedures (including critical care time) Labs Review Labs Reviewed - No data to display  Imaging Review No results found.   EKG Interpretation None      MDM   Final diagnoses:  Anaphylaxis due to hymenoptera venom, accidental or unintentional, initial encounter    Pt is a 59 y.o. male with Pmhx as above who presents with anaphylactic reaction to yellow jacket sting w/ hx of same. He states he got 3 stings  around 1PM while at work and took an epi shortly after. He denies CP, SOB, rash, ab pain, n/v/d, though has audible wheezing on pulm exam. 2nd epi given along w/ decadron, benadryl, pepcid, and albuterol MDI.    Pt states he feels somewhat better. Wheezing somewhat improved.  Will give another duoneb. Care transferred to Dr. Regenia Skeeter who will observe until approx 6pm. Will plan on d/c home w/ 3 days of pred, benadryl, zantac. Return precautions given for new or worsening symptoms including s/sx of rebound anaphylaxis.          Neta Ehlers, MD 10/11/13 [phone]

## 2013-10-11 NOTE — ED Provider Notes (Signed)
Patient appears well, no wheezing or recurrence of symptoms. Stable for discharge. Medicines prescribed by Dr. Patria Mane, MD 10/12/13 [phone]

## 2013-10-11 NOTE — ED Notes (Signed)
Pt taken off O2 will monitor O2 sats

## 2013-10-11 NOTE — Discharge Instructions (Signed)
Anaphylactic Reaction °An anaphylactic reaction is a sudden, severe allergic reaction. It affects the whole body. It can be life threatening. You may need to stay in the hospital.  °HOME CARE °· Wear a medical bracelet or necklace that lists your allergy. °· Carry your allergy kit or medicine shot to treat severe allergic reactions with you. These can save your life. °· Do not drive until medicine from your shot has worn off, unless your doctor says it is okay. °· If you have hives or a rash: °¨ Take medicine as told by your doctor. °¨ You may take over-the-counter antihistamine medicine. °¨ Place cold cloths on your skin. Take baths in cool water. Avoid hot baths and hot showers. °GET HELP RIGHT AWAY IF:  °· Your mouth is puffy (swollen), or you have trouble breathing. °· You start making whistling sounds when you breathe (wheezing). °· You have a tight feeling in your chest or throat. °· You have a rash, hives, puffiness, or itching on your body. °· You throw up (vomit) or have watery poop (diarrhea). °· You feel dizzy or pass out (faint). °· You think you are having an allergic reaction. °· You have new symptoms. °This is an emergency. Use your medicine shot or allergy kit as told. Call your local emergency services (911 in [place] Even if you feel better after the shot, you need to go to the hospital emergency department. °MAKE SURE YOU:  °· Understand these instructions. °· Will watch your condition. °· Will get help right away if you are not doing well or get worse. °Document Released: 08/25/2007 Document Revised: 09/07/2011 Document Reviewed: 06/09/2011 °[employer]® Patient Information ©2015 [employer]. This information is not intended to replace advice given to you by your health care provider. Make sure you discuss any questions you have with your health care provider. ° °

## 2013-10-11 NOTE — ED Notes (Signed)
Pt from home c/o bee sting x 3 today; pt sts was at work and yellow jackets stung pt x 3; pt with hx anaphylaxis in past; pt took own epi pen

## 2013-10-25 ENCOUNTER — Other Ambulatory Visit: Payer: Self-pay | Admitting: Endocrinology

## 2013-10-29 ENCOUNTER — Other Ambulatory Visit: Payer: Self-pay | Admitting: Endocrinology

## 2013-11-21 ENCOUNTER — Other Ambulatory Visit: Payer: Self-pay | Admitting: Internal Medicine

## 2013-11-29 ENCOUNTER — Other Ambulatory Visit: Payer: Self-pay | Admitting: Internal Medicine

## 2013-12-06 ENCOUNTER — Ambulatory Visit (INDEPENDENT_AMBULATORY_CARE_PROVIDER_SITE_OTHER): Payer: 59 | Admitting: Endocrinology

## 2013-12-06 ENCOUNTER — Encounter: Payer: Self-pay | Admitting: Endocrinology

## 2013-12-06 VITALS — BP 122/66 | HR 65 | Temp 98.2°F | Ht 65.0 in | Wt 273.0 lb

## 2013-12-06 DIAGNOSIS — E119 Type 2 diabetes mellitus without complications: Secondary | ICD-10-CM

## 2013-12-06 DIAGNOSIS — Z23 Encounter for immunization: Secondary | ICD-10-CM

## 2013-12-06 LAB — LDL CHOLESTEROL, DIRECT: Direct LDL: 124.8 mg/dL

## 2013-12-06 LAB — LIPID PANEL
CHOLESTEROL: 191 mg/dL (ref 0–200)
HDL: 32.6 mg/dL — ABNORMAL LOW (ref >39.00)
NonHDL: 158.4
TRIGLYCERIDES: 287 mg/dL — AB (ref 0.0–149.0)
Total CHOL/HDL Ratio: 6
VLDL: 57.4 mg/dL — ABNORMAL HIGH (ref 0.0–40.0)

## 2013-12-06 LAB — MICROALBUMIN / CREATININE URINE RATIO
Creatinine,U: 23.7 mg/dL
MICROALB UR: 4.7 mg/dL — AB (ref 0.0–1.9)
Microalb Creat Ratio: 19.8 mg/g (ref 0.0–30.0)

## 2013-12-06 LAB — HEMOGLOBIN A1C: Hgb A1c MFr Bld: 12.7 % — ABNORMAL HIGH (ref 4.6–6.5)

## 2013-12-06 MED ORDER — INSULIN ISOPHANE HUMAN 100 UNIT/ML KWIKPEN: 180.0000 [IU] | PEN_INJECTOR | SUBCUTANEOUS | Status: DC

## 2013-12-06 NOTE — Patient Instructions (Addendum)
check your blood sugar 2 times a day.  vary the time of day when you check, between before the 3 meals, and at bedtime.  also check if you have symptoms of your blood sugar being too high or too low.  please keep a record of the readings and bring it to your next appointment here.  please call us sooner if your blood sugar goes below 70, or if you have a lot of readings over 200.   Please come back for a follow-up appointment in 1 month.  blood and urine tests are being requested for you today.  We'll contact you with results.    Please increase the NPH insulin to 180 units each morning, when you don't work. On work days, take just 150 units.  On this type of insulin schedule, you should eat meals on a regular schedule.  If a meal is missed or significantly delayed, your blood sugar could go low.

## 2013-12-06 NOTE — Progress Notes (Signed)
Subjective:    Patient ID: Anthony Woods, male    DOB: March 29, 1955, 58 y.o.   MRN: [id number]  HPI Pt returns for f/u of diabetes mellitus:  DM type: 1 Dx'ed: 5956 Complications: retinopathy and CAD Therapy: insulin since 2012 DKA: never Severe hypoglycemia: never Pancreatitis: never Other info: he declines weight-loss surgery; he was changed to qd insulin, due to poor results with multiple daily injections; he was changed to qam NPH, due to the pattern of his cbg's; he needs less insulin on work days.    Interval history: He says he never misses the insulin.  He still has mild hypoglycemia in the afternoon, approx once per month.  This happens on work days only.  no cbg record, but states cbg's vary widely.  It is lowest on work days, and when he eats a small breakfast and/or lunch.    Past Medical History  Diagnosis Date  . CAD (coronary artery disease)   . Type II or unspecified type diabetes mellitus without mention of complication, not stated as uncontrolled   . HTN (hypertension)   . Hypothyroidism   . Allergy to bee sting   . Macular degeneration   . Hyperlipidemia   . Obesity     Past Surgical History  Procedure Laterality Date  . Knee surgery      Meniscus removal  . Wrist ganglion excision      left    History   Social History  . Marital Status: Married    Spouse Name: N/A    Number of Children: 1  . Years of Education: N/A   Occupational History  . [employer]   . 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 Smoking status: Current Every Day Smoker -- 2.00 packs/day for 40 years    Types: Cigarettes  . Smokeless tobacco: Never Used  . Alcohol Use: No  . Drug Use: No  . Sexual Activity: Yes   Other Topics Concern  . Not on file   Social History Narrative  . No narrative on file    Current Outpatient Prescriptions on File Prior to Visit  Medication Sig Dispense Refill  . aspirin 81 MG EC tablet Take 81 mg by mouth daily.        Marland Kitchen  atenolol-chlorthalidone (TENORETIC) 100-25 MG per tablet Take 1 tablet by mouth daily.      . diphenhydrAMINE (BENADRYL) 25 MG tablet Take 1 tablet (25 mg total) by mouth every 6 (six) hours. For 3 days  12 tablet  0  . EPINEPHrine (EPIPEN 2-PAK) 0.3 mg/0.3 mL SOAJ injection Inject 0.3 mLs (0.3 mg total) into the muscle once.  2 Device  1  . EPINEPHrine (EPIPEN) 0.3 mg/0.3 mL IJ SOAJ injection Inject 0.3 mLs (0.3 mg total) into the muscle as needed.  2 Device  2  . famotidine (PEPCID) 20 MG tablet Take 1 tablet (20 mg total) by mouth 2 (two) times daily. For 3 days  6 tablet  0  . furosemide (LASIX) 40 MG tablet Take 40 mg by mouth daily.      Marland Kitchen glucagon (GLUCAGON EMERGENCY) 1 MG injection Inject 1 mg into the vein once as needed.  1 each  4  . glucose blood test strip 1 each by Other route See admin instructions. Check blood sugar 2 times daily.      . Insulin Pen Needle (PEN NEEDLES) 31G X 6 MM MISC 1 each by Other route See admin instructions.  Use once daily with insulin pen.      . Lancets MISC 1 each by Other route See admin instructions. Check blood sugar 2 times daily.      Marland Kitchen levothyroxine (SYNTHROID, LEVOTHROID) 175 MCG tablet Take 175 mcg by mouth daily before breakfast.      . losartan (COZAAR) 100 MG tablet Take 100 mg by mouth daily.      . potassium chloride (K-DUR) 10 MEQ tablet TAKE 1 TABLET BY MOUTH 2 TIMES DAILY.  60 tablet  0  . predniSONE (DELTASONE) 10 MG tablet Take 60 mg by mouth as needed. For bee stings      . predniSONE (DELTASONE) 20 MG tablet Take 3 tablets (60 mg total) by mouth daily. For 3 days  9 tablet  0  . simvastatin (ZOCOR) 40 MG tablet Take 40 mg by mouth daily.      . simvastatin (ZOCOR) 40 MG tablet TAKE 1 TABLET BY MOUTH DAILY.  30 tablet  5   No current facility-administered medications on file prior to visit.    Allergies  Allergen Reactions  . Actos [Pioglitazone Hydrochloride] Swelling  . Bee Venom Itching  . Lisinopril Other (See Comments)     cough    Family History  Problem Relation Age of Onset  . Hypertension Other   . Diabetes Father   . Cancer Father     stomach  . CVA Father   . Stomach cancer Father   . Diabetes Mother   . Cancer Mother     male ca  . Colon cancer Mother 40  . Diabetes Other   . CAD Maternal Grandmother     BP 122/66  Pulse 65  Temp(Src) 98.2 F (36.8 C) (Oral)  Ht 5\' 5"  (1.651 m)  Wt 273 lb (123.832 kg)  BMI 45.43 kg/m2  SpO2 95%    Review of Systems He has lost a few lbs.  Denies LOC    Objective:   Physical Exam VITAL SIGNS:  See vs page GENERAL: no distress.  Morbid obesity Pulses: dorsalis pedis intact bilat.   Feet: no deformity.  1+ bilat leg edema Skin:  no ulcer on the feet.  normal color and temp. Neuro: sensation is intact to touch on the feet  Lab Results  Component Value Date   HGBA1C 12.7* 12/06/2013       Assessment & Plan:  DM: severe exacerbation Noncompliance with cbg recording: I'll work around this as best I can. Occupational status: this is complicating the rx of DM   Patient is advised the following: Patient Instructions  check your blood sugar 2 times a day.  vary the time of day when you check, between before the 3 meals, and at bedtime.  also check if you have symptoms of your blood sugar being too high or too low.  please keep a record of the readings and bring it to your next appointment here.  please call us sooner if your blood sugar goes below 70, or if you have a lot of readings over 200.   Please come back for a follow-up appointment in 1 month.  blood and urine tests are being requested for you today.  We'll contact you with results.    Please increase the NPH insulin to 180 units each morning, when you don't work. On work days, take just 150 units.  On this type of insulin schedule, you should eat meals on a regular schedule.  If a meal is missed or significantly  delayed, your blood sugar could go low.

## 2013-12-17 ENCOUNTER — Telehealth: Payer: Self-pay | Admitting: *Deleted

## 2013-12-17 NOTE — Telephone Encounter (Signed)
Called and left several messages to call back in ref: to setting up home sleep study. Pt has nto returned any calls. Anthony Woods

## 2014-01-07 ENCOUNTER — Encounter: Payer: Self-pay | Admitting: Endocrinology

## 2014-01-07 ENCOUNTER — Ambulatory Visit (INDEPENDENT_AMBULATORY_CARE_PROVIDER_SITE_OTHER): Payer: 59 | Admitting: Endocrinology

## 2014-01-07 ENCOUNTER — Encounter: Payer: 59 | Attending: Endocrinology | Admitting: Nutrition

## 2014-01-07 VITALS — BP 124/68 | HR 67 | Temp 99.0°F | Ht 65.0 in | Wt 282.0 lb

## 2014-01-07 DIAGNOSIS — Z794 Long term (current) use of insulin: Secondary | ICD-10-CM | POA: Diagnosis not present

## 2014-01-07 DIAGNOSIS — Z713 Dietary counseling and surveillance: Secondary | ICD-10-CM | POA: Diagnosis not present

## 2014-01-07 DIAGNOSIS — E119 Type 2 diabetes mellitus without complications: Secondary | ICD-10-CM | POA: Insufficient documentation

## 2014-01-07 DIAGNOSIS — E1159 Type 2 diabetes mellitus with other circulatory complications: Secondary | ICD-10-CM

## 2014-01-07 NOTE — Progress Notes (Signed)
Subjective:    Patient ID: Anthony Woods, male    DOB: Aug 24, 1955, 58 y.o.   MRN: [id number]  HPI DM type: 1 Dx'ed: 2947 Complications: retinopathy and CAD Therapy: insulin since 2012 DKA: never Severe hypoglycemia: never.  Pancreatitis: never Other info: he declines weight-loss surgery; he was changed to qd insulin, due to poor results with multiple daily injections; he was changed to qam NPH, due to the pattern of his cbg's; he needs less insulin on work days.   Interval history:  He says he misses the insulin approx twice per week.  He says this is due to oversleeping on weekends, but he also misses during the week sometimes.  He says the pen needle sometimes slightly bends in his abdominal sq tissue.  No assoc pain.  no cbg record, but states cbg's are extremely variable. He says he seldom misses the zocor.   Past Medical History  Diagnosis Date  . CAD (coronary artery disease)   . Type II or unspecified type diabetes mellitus without mention of complication, not stated as uncontrolled   . HTN (hypertension)   . Hypothyroidism   . Allergy to bee sting   . Macular degeneration   . Hyperlipidemia   . Obesity     Past Surgical History  Procedure Laterality Date  . Knee surgery      Meniscus removal  . Wrist ganglion excision      left    History   Social History  . Marital Status: Married    Spouse Name: N/A    Number of Children: 1  . Years of Education: N/A   Occupational History  . [employer]   . 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 Smoking status: Current Every Day Smoker -- 2.00 packs/day for 40 years    Types: Cigarettes  . Smokeless tobacco: Never Used  . Alcohol Use: No  . Drug Use: No  . Sexual Activity: Yes   Other Topics Concern  . Not on file   Social History Narrative  . No narrative on file    Current Outpatient Prescriptions on File Prior to Visit  Medication Sig Dispense Refill  . aspirin 81 MG EC tablet Take 81 mg by mouth  daily.        Marland Kitchen atenolol-chlorthalidone (TENORETIC) 100-25 MG per tablet Take 1 tablet by mouth daily.      . diphenhydrAMINE (BENADRYL) 25 MG tablet Take 1 tablet (25 mg total) by mouth every 6 (six) hours. For 3 days  12 tablet  0  . EPINEPHrine (EPIPEN 2-PAK) 0.3 mg/0.3 mL SOAJ injection Inject 0.3 mLs (0.3 mg total) into the muscle once.  2 Device  1  . EPINEPHrine (EPIPEN) 0.3 mg/0.3 mL IJ SOAJ injection Inject 0.3 mLs (0.3 mg total) into the muscle as needed.  2 Device  2  . famotidine (PEPCID) 20 MG tablet Take 1 tablet (20 mg total) by mouth 2 (two) times daily. For 3 days  6 tablet  0  . furosemide (LASIX) 40 MG tablet Take 40 mg by mouth daily.      Marland Kitchen glucagon (GLUCAGON EMERGENCY) 1 MG injection Inject 1 mg into the vein once as needed.  1 each  4  . glucose blood test strip 1 each by Other route See admin instructions. Check blood sugar 2 times daily.      . Insulin NPH, Human,, Isophane, (HUMULIN N KWIKPEN) 100 UNIT/ML Kiwkpen Inject 180 Units into the  skin every morning.  60 mL  11  . Insulin Pen Needle (PEN NEEDLES) 31G X 6 MM MISC 1 each by Other route See admin instructions. Use once daily with insulin pen.      . Lancets MISC 1 each by Other route See admin instructions. Check blood sugar 2 times daily.      Marland Kitchen levothyroxine (SYNTHROID, LEVOTHROID) 175 MCG tablet Take 175 mcg by mouth daily before breakfast.      . losartan (COZAAR) 100 MG tablet Take 100 mg by mouth daily.      . potassium chloride (K-DUR) 10 MEQ tablet TAKE 1 TABLET BY MOUTH 2 TIMES DAILY.  60 tablet  0  . predniSONE (DELTASONE) 10 MG tablet Take 60 mg by mouth as needed. For bee stings      . predniSONE (DELTASONE) 20 MG tablet Take 3 tablets (60 mg total) by mouth daily. For 3 days  9 tablet  0  . simvastatin (ZOCOR) 40 MG tablet Take 40 mg by mouth daily.      . simvastatin (ZOCOR) 40 MG tablet TAKE 1 TABLET BY MOUTH DAILY.  30 tablet  5   No current facility-administered medications on file prior to visit.      Allergies  Allergen Reactions  . Actos [Pioglitazone Hydrochloride] Swelling  . Bee Venom Itching  . Lisinopril Other (See Comments)    cough    Family History  Problem Relation Age of Onset  . Hypertension Other   . Diabetes Father   . Cancer Father     stomach  . CVA Father   . Stomach cancer Father   . Diabetes Mother   . Cancer Mother     male ca  . Colon cancer Mother 56  . Diabetes Other   . CAD Maternal Grandmother     BP 124/68  Pulse 67  Temp(Src) 99 F (37.2 C) (Oral)  Ht 5\' 5"  (1.651 m)  Wt 282 lb (127.914 kg)  BMI 46.93 kg/m2  SpO2 92%  Review of Systems He denies hypoglycemia and weight change.      Objective:   Physical Exam VITAL SIGNS:  See vs page GENERAL: no distress Pulses: dorsalis pedis intact bilat.   Feet: no deformity.  1+ bilat leg edema Skin:  no ulcer on the feet.  normal color and temp. Neuro: sensation is intact to touch on the feet.     Lab Results  Component Value Date   HGBA1C 12.7* 12/06/2013       Assessment & Plan:  DM: moderate exacerbation. Dyslipidemia: back on rx.  We'll recheck this in the future. Noncompliance with cbg recording and insulin injections: I'll work around this as best I can.  We discussed ways to improve compliance.     Patient is advised the following: Patient Instructions  check your blood sugar 2 times a day.  vary the time of day when you check, between before the 3 meals, and at bedtime.  also check if you have symptoms of your blood sugar being too high or too low.  please keep a record of the readings and bring it to your next appointment here.  please call us sooner if your blood sugar goes below 70, or if you have a lot of readings over 200.   Please come back for a follow-up appointment in 1 month.  Please take the NPH insulin to 180 units each morning, when you don't work.   On work days, take just 150 units.  On this type of insulin schedule, you should eat meals on a regular  schedule.  If a meal is missed or significantly delayed, your blood sugar could go low.  Try putting the insulin pen next to something you use each morning, such as your toothbrush.   Please see Vaughan Basta, about injecting your insulin.

## 2014-01-07 NOTE — Patient Instructions (Addendum)
check your blood sugar 2 times a day.  vary the time of day when you check, between before the 3 meals, and at bedtime.  also check if you have symptoms of your blood sugar being too high or too low.  please keep a record of the readings and bring it to your next appointment here.  please call us sooner if your blood sugar goes below 70, or if you have a lot of readings over 200.   Please come back for a follow-up appointment in 1 month.  Please take the NPH insulin to 180 units each morning, when you don't work.   On work days, take just 150 units.  On this type of insulin schedule, you should eat meals on a regular schedule.  If a meal is missed or significantly delayed, your blood sugar could go low.  Try putting the insulin pen next to something you use each morning, such as your toothbrush.   Please see Vaughan Basta, about injecting your insulin.

## 2014-01-08 NOTE — Progress Notes (Signed)
Patient reports that when he pulls out his needle, it is usually bent, and he is not sure he is getting the full dose--sometimes the insulin leaks back out of this injection site. He uses only 2 sites on his abdomen, and they are hardened.  I explained that he needs to use new sites, because these have gotten scarred, and they are not absorbing the insulin as well.  He was shown different sites to use,and he was given a 30g, needle, instead of a 32g, which I told him was a little thicker, and would not bend as quickly. I explained that these sites need to get blood to them to pick up the insulin.  When the tissue is scarred, the blood flow does not get to the insulin deposits under the skin, and will not work to lower the blood sugar.  He reported good understanding of this and agreed to try new sites.  H He is also reporting low blood sugars some days when he gets home from work, despite having a good lunch.  He will monitor how many times he has this this week and call Monday with the number. He is also eating cereal and milk for breakfast, and he was given other alternatives.  He sometimes eats cereal and then stops and gets a sausage biscuit on his way to work.  Discussed other alternatives he can eat.

## 2014-01-08 NOTE — Patient Instructions (Signed)
1.  Stop eating cereal and milk for breakfast 2.  Use the 30g needles and let me know if they continue to bend 3.  Use alternative injection sites that stay at least 2 inches away from older sites.

## 2014-01-14 ENCOUNTER — Telehealth: Payer: Self-pay | Admitting: Nutrition

## 2014-01-14 NOTE — Telephone Encounter (Signed)
I do not know whose patient this is, please forward accordingly

## 2014-01-14 NOTE — Telephone Encounter (Signed)
Message was left on my machine that she wanted to give me her husband's blood sugar readings. I phoned her back, but had to leave a message to call me today before noon, or to call Dr. Ronnie Derby office number.  That number was given to her

## 2014-01-15 NOTE — Telephone Encounter (Signed)
Phone call from wife with blood sugar readings: 01/11/14 FBS: 243     AcS: 203 01/12/14: FBS: 301    AcS: 190 01/13/14: FBS: 300    AcS: 338 01/14/14: FBS: 226    AcS:  58 01/15/14  FBS: 128 Wife says he is eating better--watching portion sizes.  Only one low blood sugar and patient was symtomatic.  He treated it appropriately.

## 2014-01-15 NOTE — Telephone Encounter (Signed)
please call patient: Your blood sugar was much lower on 01/14/14.  Why do you think this was?  Did you work that day?  How many units did you take that am?  How many units did you take on the other days?

## 2014-01-16 NOTE — Telephone Encounter (Signed)
Requested call back to discuss.  

## 2014-01-16 NOTE — Telephone Encounter (Signed)
Thank you: Please increase the NPH insulin to 220 units each morning, when you don't work.  On work days, take just 120 units. i'll see you next time.

## 2014-01-16 NOTE — Telephone Encounter (Signed)
Contacted pt. He states that on 10/23 and 10/26 he took 150 units in the morning. On 10/24 and 10/25 he took 180 units. Pt states that on 10/26 when his blood sugar dropped he had worked that day and took 150 units that morning. Pt could not give a reason has to why his sugar had dropped.

## 2014-01-17 NOTE — Telephone Encounter (Signed)
Unable to reach pt. Will try again at a later time.  

## 2014-01-17 NOTE — Telephone Encounter (Signed)
Pt calling you back

## 2014-01-17 NOTE — Telephone Encounter (Signed)
Requested call back to discuss.  

## 2014-01-18 NOTE — Telephone Encounter (Signed)
Unable to reach pt. Will try again at a later time.  

## 2014-01-21 NOTE — Telephone Encounter (Signed)
Contacted pt and advised of below. Pt stated that for right now he did not want to increase his insulin. Pt states that he is going to stay on 150 units when he is working and 180 when he is not working. Pt stated he wants to stay on this dosage for right now. Pt advised that based on his blood sugars from last week his readings have been high and the insulin adjustment would aid in bringing the numbers down. Pt states he had another low reading on Friday but did not use his meter to check and did not the increased dosage to bring his sugar lower. Pt advised to check sugar as instructed and to call back in 3 days to give numbers. Pt voiced understanding.

## 2014-01-29 ENCOUNTER — Ambulatory Visit (INDEPENDENT_AMBULATORY_CARE_PROVIDER_SITE_OTHER): Payer: 59 | Admitting: Internal Medicine

## 2014-01-29 ENCOUNTER — Encounter: Payer: Self-pay | Admitting: Internal Medicine

## 2014-01-29 VITALS — BP 148/80 | HR 67 | Temp 98.6°F | Wt 286.0 lb

## 2014-01-29 DIAGNOSIS — I2510 Atherosclerotic heart disease of native coronary artery without angina pectoris: Secondary | ICD-10-CM

## 2014-01-29 DIAGNOSIS — E034 Atrophy of thyroid (acquired): Secondary | ICD-10-CM

## 2014-01-29 DIAGNOSIS — E038 Other specified hypothyroidism: Secondary | ICD-10-CM

## 2014-01-29 DIAGNOSIS — Z720 Tobacco use: Secondary | ICD-10-CM

## 2014-01-29 DIAGNOSIS — F17200 Nicotine dependence, unspecified, uncomplicated: Secondary | ICD-10-CM

## 2014-01-29 DIAGNOSIS — E1165 Type 2 diabetes mellitus with hyperglycemia: Secondary | ICD-10-CM

## 2014-01-29 DIAGNOSIS — I10 Essential (primary) hypertension: Secondary | ICD-10-CM

## 2014-01-29 DIAGNOSIS — IMO0002 Reserved for concepts with insufficient information to code with codable children: Secondary | ICD-10-CM

## 2014-01-29 MED ORDER — INSULIN PEN NEEDLE 29G X 12.7MM MISC: Status: DC

## 2014-01-29 NOTE — Assessment & Plan Note (Signed)
Continue with current prescription therapy as reflected on the Med list. BP Readings from Last 3 Encounters:  01/29/14 148/80  01/07/14 124/68  12/06/13 122/66

## 2014-01-29 NOTE — Assessment & Plan Note (Signed)
Continue with current prescription therapy as reflected on the Med list.  

## 2014-01-29 NOTE — Assessment & Plan Note (Signed)
Wt Readings from Last 3 Encounters:  01/29/14 286 lb (129.729 kg)  01/07/14 282 lb (127.914 kg)  12/06/13 273 lb (123.832 kg)

## 2014-01-29 NOTE — Assessment & Plan Note (Addendum)
Now he is on insulin 2015 per Dr Loanne Drilling Lab Results  Component Value Date   HGBA1C 12.7* 12/06/2013  Poor control. Continue with current DM therapy as reflected on the Med list - titrate up Given Rx for longer needles

## 2014-01-29 NOTE — Progress Notes (Signed)
Pre visit review using our clinic review tool, if applicable. No additional management support is needed unless otherwise documented below in the visit note. 

## 2014-01-29 NOTE — Assessment & Plan Note (Signed)
Discussed.

## 2014-02-04 ENCOUNTER — Other Ambulatory Visit: Payer: Self-pay | Admitting: Internal Medicine

## 2014-02-07 ENCOUNTER — Encounter: Payer: Self-pay | Admitting: Endocrinology

## 2014-02-07 ENCOUNTER — Ambulatory Visit (INDEPENDENT_AMBULATORY_CARE_PROVIDER_SITE_OTHER): Payer: 59 | Admitting: Endocrinology

## 2014-02-07 VITALS — BP 136/86 | HR 66 | Temp 98.0°F | Ht 65.0 in | Wt 284.0 lb

## 2014-02-07 DIAGNOSIS — E1165 Type 2 diabetes mellitus with hyperglycemia: Secondary | ICD-10-CM

## 2014-02-07 DIAGNOSIS — IMO0002 Reserved for concepts with insufficient information to code with codable children: Secondary | ICD-10-CM

## 2014-02-07 LAB — HEMOGLOBIN A1C
Hgb A1c MFr Bld: 11.3 % — ABNORMAL HIGH
Mean Plasma Glucose: 278 mg/dL — ABNORMAL HIGH

## 2014-02-07 NOTE — Progress Notes (Signed)
Subjective:    Patient ID: Anthony Woods, male    DOB: September 01, 1955, 58 y.o.   MRN: [id number]  HPI DM type: 1 Dx'ed: 0175 Complications: retinopathy and CAD Therapy: insulin since 2012 DKA: never Severe hypoglycemia: never.  Pancreatitis: never Other: he declines weight-loss surgery; he was changed to qd insulin, due to poor results with multiple daily injections; he was changed to qam NPH, due to the pattern of his cbg's; he needs less insulin on work days; he declines weight loss surgery.  Interval history:  Pt says he takes the insulin qd as rx'ed.  no cbg record, but states cbg's vary from 72-356.  It was lowest on a day when he took the full 180 units on a work day.  pt states he otherwise feels well in general.  Past Medical History  Diagnosis Date  . CAD (coronary artery disease)   . Type II or unspecified type diabetes mellitus without mention of complication, not stated as uncontrolled   . HTN (hypertension)   . Hypothyroidism   . Allergy to bee sting   . Macular degeneration   . Hyperlipidemia   . Obesity     Past Surgical History  Procedure Laterality Date  . Knee surgery      Meniscus removal  . Wrist ganglion excision      left    History   Social History  . Marital Status: Married    Spouse Name: N/A    Number of Children: 1  . Years of Education: N/A   Occupational History  . [employer]   . 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 Smoking status: Current Every Day Smoker -- 2.00 packs/day for 40 years    Types: Cigarettes  . Smokeless tobacco: Never Used  . Alcohol Use: No  . Drug Use: No  . Sexual Activity: Yes   Other Topics Concern  . Not on file   Social History Narrative    Current Outpatient Prescriptions on File Prior to Visit  Medication Sig Dispense Refill  . aspirin 81 MG EC tablet Take 81 mg by mouth daily.      Marland Kitchen atenolol-chlorthalidone (TENORETIC) 100-25 MG per tablet Take 1 tablet by mouth daily.    . diphenhydrAMINE  (BENADRYL) 25 MG tablet Take 1 tablet (25 mg total) by mouth every 6 (six) hours. For 3 days 12 tablet 0  . EPINEPHrine (EPIPEN 2-PAK) 0.3 mg/0.3 mL SOAJ injection Inject 0.3 mLs (0.3 mg total) into the muscle once. 2 Device 1  . EPINEPHrine (EPIPEN) 0.3 mg/0.3 mL IJ SOAJ injection Inject 0.3 mLs (0.3 mg total) into the muscle as needed. 2 Device 2  . famotidine (PEPCID) 20 MG tablet Take 1 tablet (20 mg total) by mouth 2 (two) times daily. For 3 days 6 tablet 0  . furosemide (LASIX) 40 MG tablet Take 40 mg by mouth daily.    Marland Kitchen glucagon (GLUCAGON EMERGENCY) 1 MG injection Inject 1 mg into the vein once as needed. 1 each 4  . glucose blood test strip 1 each by Other route See admin instructions. Check blood sugar 2 times daily.    . Insulin NPH, Human,, Isophane, (HUMULIN N KWIKPEN) 100 UNIT/ML Kiwkpen Inject 180 Units into the skin every morning. (Patient taking differently: Inject 200 Units into the skin every morning. ) 60 mL 11  . Insulin Pen Needle (BD ULTRA-FINE PEN NEEDLES) 29G X 12.7MM MISC Use for insulin shots 100 each 11  .  Insulin Pen Needle (PEN NEEDLES) 31G X 6 MM MISC 1 each by Other route See admin instructions. Use once daily with insulin pen.    . Lancets MISC 1 each by Other route See admin instructions. Check blood sugar 2 times daily.    Marland Kitchen levothyroxine (SYNTHROID, LEVOTHROID) 175 MCG tablet Take 175 mcg by mouth daily before breakfast.    . losartan (COZAAR) 100 MG tablet Take 100 mg by mouth daily.    . potassium chloride (K-DUR) 10 MEQ tablet TAKE 1 TABLET BY MOUTH 2 TIMES DAILY. 60 tablet 11  . predniSONE (DELTASONE) 10 MG tablet Take 60 mg by mouth as needed. For bee stings    . simvastatin (ZOCOR) 40 MG tablet Take 40 mg by mouth daily.    . simvastatin (ZOCOR) 40 MG tablet TAKE 1 TABLET BY MOUTH DAILY. 30 tablet 5   No current facility-administered medications on file prior to visit.    Allergies  Allergen Reactions  . Actos [Pioglitazone Hydrochloride] Swelling  .  Bee Venom Itching  . Lisinopril Other (See Comments)    cough    Family History  Problem Relation Age of Onset  . Hypertension Other   . Diabetes Father   . Cancer Father     stomach  . CVA Father   . Stomach cancer Father   . Diabetes Mother   . Cancer Mother     male ca  . Colon cancer Mother 39  . Diabetes Other   . CAD Maternal Grandmother     BP 136/86 mmHg  Pulse 66  Temp(Src) 98 F (36.7 C) (Oral)  Ht 5\' 5"  (1.651 m)  Wt 284 lb (128.822 kg)  BMI 47.26 kg/m2  SpO2 98%    Review of Systems He denies hypoglycemia and weight change.      Objective:   Physical Exam VITAL SIGNS:  See vs page.   GENERAL: no distress.  Pulses: dorsalis pedis intact bilat.   Feet: no deformity. 2+ bilat leg edema.   Skin: no ulcer on the feet. normal color and temp.  Neuro: sensation is intact to touch on the feet.     Lab Results  Component Value Date   HGBA1C 11.3* 02/07/2014       Assessment & Plan:  DM: severe exacerbation. Noncompliance with cbg recording, persistent: I'll work around this as best I can  Patient is advised the following: Patient Instructions  check your blood sugar 2 times a day.  vary the time of day when you check, between before the 3 meals, and at bedtime.  also check if you have symptoms of your blood sugar being too high or too low.  please keep a record of the readings and bring it to your next appointment here.  please call us sooner if your blood sugar goes below 70, or if you have a lot of readings over 200.   Please come back for a follow-up appointment in 1 month.  Please increase the NPH insulin to 200 units each morning, when you don't work.   blood tests are being requested for you today.  We'll let you know about the results.  On work days, take just 150 units.  On this type of insulin schedule, you should eat meals on a regular schedule.  If a meal is missed or significantly delayed, your blood sugar could go low.    addendum:  increase insulin to 250 units each morning, when you don't work. On work days, take just  150 units.

## 2014-02-07 NOTE — Patient Instructions (Addendum)
check your blood sugar 2 times a day.  vary the time of day when you check, between before the 3 meals, and at bedtime.  also check if you have symptoms of your blood sugar being too high or too low.  please keep a record of the readings and bring it to your next appointment here.  please call us sooner if your blood sugar goes below 70, or if you have a lot of readings over 200.   Please come back for a follow-up appointment in 1 month.  Please increase the NPH insulin to 200 units each morning, when you don't work.   blood tests are being requested for you today.  We'll let you know about the results.  On work days, take just 150 units.  On this type of insulin schedule, you should eat meals on a regular schedule.  If a meal is missed or significantly delayed, your blood sugar could go low.

## 2014-02-28 ENCOUNTER — Encounter (HOSPITAL_COMMUNITY): Payer: Self-pay | Admitting: Cardiovascular Disease

## 2014-03-26 ENCOUNTER — Ambulatory Visit (INDEPENDENT_AMBULATORY_CARE_PROVIDER_SITE_OTHER): Payer: 59 | Admitting: Endocrinology

## 2014-03-26 ENCOUNTER — Encounter: Payer: Self-pay | Admitting: Endocrinology

## 2014-03-26 VITALS — BP 126/76 | HR 61 | Temp 98.5°F | Ht 65.0 in | Wt 288.0 lb

## 2014-03-26 DIAGNOSIS — E1165 Type 2 diabetes mellitus with hyperglycemia: Secondary | ICD-10-CM

## 2014-03-26 DIAGNOSIS — IMO0002 Reserved for concepts with insufficient information to code with codable children: Secondary | ICD-10-CM

## 2014-03-26 NOTE — Patient Instructions (Addendum)
check your blood sugar 2 times a day.  vary the time of day when you check, between before the 3 meals, and at bedtime.  also check if you have symptoms of your blood sugar being too high or too low.  please keep a record of the readings and bring it to your next appointment here.  please call us sooner if your blood sugar goes below 70, or if you have a lot of readings over 200.   Please come back for a follow-up appointment in 1 month.  Please increase the NPH insulin to 230 units each morning, when you don't work.   On work days, take just 180 units.   On this type of insulin schedule, you should eat meals on a regular schedule.  If a meal is missed or significantly delayed, your blood sugar could go low.

## 2014-03-26 NOTE — Progress Notes (Signed)
Subjective:    Patient ID: Anthony Woods, male    DOB: [birth year], 59 y.o.   MRN: [id number]  HPI  Pt returns for f/u of diabetes mellitus.   DM type: Insulin-requiring type 2 Dx'ed: 0973 Complications: retinopathy and CAD Therapy: insulin since 2012 DKA: never Severe hypoglycemia: never.  Pancreatitis: never Other: he declines weight-loss surgery; he was changed to qd insulin, due to poor results with multiple daily injections; he was changed to qam NPH, due to the pattern of his cbg's; he needs less insulin on work days.   Interval history:  Pt says he takes the insulin qd as rx'ed, but only 180 units.  He takes the same, if he works or not.  no cbg record, but states cbg's vary from 163-171 in am, which is the only time of day he checks.  He says he seldom checks cbg's.  pt states he feels well in general.  Past Medical History  Diagnosis Date  . CAD (coronary artery disease)   . Type II or unspecified type diabetes mellitus without mention of complication, not stated as uncontrolled   . HTN (hypertension)   . Hypothyroidism   . Allergy to bee sting   . Macular degeneration   . Hyperlipidemia   . Obesity     Past Surgical History  Procedure Laterality Date  . Knee surgery      Meniscus removal  . Wrist ganglion excision      left  . Left heart catheterization with coronary angiogram N/A 02/04/2012    Procedure: LEFT HEART CATHETERIZATION WITH CORONARY ANGIOGRAM;  Surgeon: Burnell Blanks, MD;  Location: [facility] C[facility] LAB;  Service: Cardiovascular;  Laterality: N/A;    History   Social History  . Marital Status: Married    Spouse Name: N/A    Number of Children: 1  . Years of Education: N/A   Occupational History  . [employer]   . 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 Smoking status: Current Every Day Smoker -- 2.00 packs/day for 40 years    Types: Cigarettes  . Smokeless tobacco: Never Used  . Alcohol Use: No  . Drug Use: No  . Sexual Activity:  Yes   Other Topics Concern  . Not on file   Social History Narrative    Current Outpatient Prescriptions on File Prior to Visit  Medication Sig Dispense Refill  . aspirin 81 MG EC tablet Take 81 mg by mouth daily.      Marland Kitchen atenolol-chlorthalidone (TENORETIC) 100-25 MG per tablet Take 1 tablet by mouth daily.    . diphenhydrAMINE (BENADRYL) 25 MG tablet Take 1 tablet (25 mg total) by mouth every 6 (six) hours. For 3 days 12 tablet 0  . EPINEPHrine (EPIPEN 2-PAK) 0.3 mg/0.3 mL SOAJ injection Inject 0.3 mLs (0.3 mg total) into the muscle once. 2 Device 1  . EPINEPHrine (EPIPEN) 0.3 mg/0.3 mL IJ SOAJ injection Inject 0.3 mLs (0.3 mg total) into the muscle as needed. 2 Device 2  . famotidine (PEPCID) 20 MG tablet Take 1 tablet (20 mg total) by mouth 2 (two) times daily. For 3 days 6 tablet 0  . furosemide (LASIX) 40 MG tablet Take 40 mg by mouth daily.    Marland Kitchen glucagon (GLUCAGON EMERGENCY) 1 MG injection Inject 1 mg into the vein once as needed. 1 each 4  . glucose blood test strip 1 each by Other route See admin instructions. Check blood sugar 2 times daily.    Marland Kitchen  Insulin Pen Needle (BD ULTRA-FINE PEN NEEDLES) 29G X 12.7MM MISC Use for insulin shots 100 each 11  . Insulin Pen Needle (PEN NEEDLES) 31G X 6 MM MISC 1 each by Other route See admin instructions. Use once daily with insulin pen.    . Lancets MISC 1 each by Other route See admin instructions. Check blood sugar 2 times daily.    Marland Kitchen levothyroxine (SYNTHROID, LEVOTHROID) 175 MCG tablet Take 175 mcg by mouth daily before breakfast.    . losartan (COZAAR) 100 MG tablet Take 100 mg by mouth daily.    . potassium chloride (K-DUR) 10 MEQ tablet TAKE 1 TABLET BY MOUTH 2 TIMES DAILY. 60 tablet 11  . predniSONE (DELTASONE) 10 MG tablet Take 60 mg by mouth as needed. For bee stings    . simvastatin (ZOCOR) 40 MG tablet Take 40 mg by mouth daily.    . simvastatin (ZOCOR) 40 MG tablet TAKE 1 TABLET BY MOUTH DAILY. 30 tablet 5   No current  facility-administered medications on file prior to visit.    Allergies  Allergen Reactions  . Actos [Pioglitazone Hydrochloride] Swelling  . Bee Venom Itching  . Lisinopril Other (See Comments)    cough    Family History  Problem Relation Age of Onset  . Hypertension Other   . Diabetes Father   . Cancer Father     stomach  . CVA Father   . Stomach cancer Father   . Diabetes Mother   . Cancer Mother     male ca  . Colon cancer Mother 71  . Diabetes Other   . CAD Maternal Grandmother    BP 126/76 mmHg  Pulse 61  Temp(Src) 98.5 F (36.9 C) (Oral)  Ht 5\' 5"  (1.651 m)  Wt 288 lb (130.636 kg)  BMI 47.93 kg/m2  SpO2 95%  Review of Systems He denies hypoglycemia and weight change.      Objective:   Physical Exam VITAL SIGNS:  See vs page GENERAL: no distress.  Morbid obesity Pulses: dorsalis pedis intact bilat.   MSK: no deformity of the feet CV: 1+ bilat leg edema Skin:  no ulcer on the feet.  normal color and temp on the feet. Neuro: sensation is intact to touch on the feet.        Assessment & Plan:  DM: poorly controlled. Noncompliance with cbg recording: I'll work around this as best I can. Occupational status: he needs less insulin on work days.     Patient is advised the following: Patient Instructions  check your blood sugar 2 times a day.  vary the time of day when you check, between before the 3 meals, and at bedtime.  also check if you have symptoms of your blood sugar being too high or too low.  please keep a record of the readings and bring it to your next appointment here.  please call us sooner if your blood sugar goes below 70, or if you have a lot of readings over 200.   Please come back for a follow-up appointment in 1 month.  Please increase the NPH insulin to 230 units each morning, when you don't work.   On work days, take just 180 units.   On this type of insulin schedule, you should eat meals on a regular schedule.  If a meal is missed or  significantly delayed, your blood sugar could go low.

## 2014-04-26 ENCOUNTER — Ambulatory Visit: Payer: Self-pay | Admitting: Endocrinology

## 2014-05-17 ENCOUNTER — Ambulatory Visit (INDEPENDENT_AMBULATORY_CARE_PROVIDER_SITE_OTHER): Payer: 59 | Admitting: Ophthalmology

## 2014-05-21 ENCOUNTER — Ambulatory Visit (INDEPENDENT_AMBULATORY_CARE_PROVIDER_SITE_OTHER): Payer: 59 | Admitting: Ophthalmology

## 2014-05-30 ENCOUNTER — Ambulatory Visit: Payer: Self-pay | Admitting: Internal Medicine

## 2014-06-07 ENCOUNTER — Ambulatory Visit: Payer: Self-pay | Admitting: Internal Medicine

## 2014-06-07 ENCOUNTER — Other Ambulatory Visit: Payer: Self-pay | Admitting: Internal Medicine

## 2014-06-11 ENCOUNTER — Ambulatory Visit: Payer: Self-pay | Admitting: Internal Medicine

## 2014-06-19 ENCOUNTER — Ambulatory Visit (INDEPENDENT_AMBULATORY_CARE_PROVIDER_SITE_OTHER): Payer: 59 | Admitting: Family Medicine

## 2014-06-19 VITALS — BP 122/80 | HR 81 | Temp 98.1°F | Resp 18 | Ht 66.5 in | Wt 292.0 lb

## 2014-06-19 DIAGNOSIS — M5442 Lumbago with sciatica, left side: Secondary | ICD-10-CM | POA: Diagnosis not present

## 2014-06-19 DIAGNOSIS — IMO0002 Reserved for concepts with insufficient information to code with codable children: Secondary | ICD-10-CM

## 2014-06-19 DIAGNOSIS — E1165 Type 2 diabetes mellitus with hyperglycemia: Secondary | ICD-10-CM

## 2014-06-19 LAB — POCT URINALYSIS DIPSTICK
Bilirubin, UA: NEGATIVE
Blood, UA: NEGATIVE
Glucose, UA: NEGATIVE
Ketones, UA: NEGATIVE
Leukocytes, UA: NEGATIVE
Nitrite, UA: NEGATIVE
Protein, UA: NEGATIVE
Spec Grav, UA: 1.01
Urobilinogen, UA: 0.2
pH, UA: 7

## 2014-06-19 LAB — POCT UA - MICROSCOPIC ONLY
Bacteria, U Microscopic: NEGATIVE
Casts, Ur, LPF, POC: NEGATIVE
Crystals, Ur, HPF, POC: NEGATIVE
Mucus, UA: NEGATIVE
RBC, urine, microscopic: NEGATIVE
WBC, Ur, HPF, POC: NEGATIVE
Yeast, UA: NEGATIVE

## 2014-06-19 LAB — POCT GLYCOSYLATED HEMOGLOBIN (HGB A1C): Hemoglobin A1C: 10.1

## 2014-06-19 LAB — GLUCOSE, POCT (MANUAL RESULT ENTRY): POC Glucose: 166 mg/dl — AB (ref 70–99)

## 2014-06-19 MED ORDER — HYDROCODONE-ACETAMINOPHEN 5-325 MG PO TABS: 1.0000 | ORAL_TABLET | Freq: Four times a day (QID) | ORAL | Status: DC | PRN

## 2014-06-19 MED ORDER — METFORMIN HCL 1000 MG PO TABS: 1000.0000 mg | ORAL_TABLET | Freq: Two times a day (BID) | ORAL | Status: DC

## 2014-06-19 MED ORDER — LINAGLIPTIN 5 MG PO TABS: 5.0000 mg | ORAL_TABLET | Freq: Every day | ORAL | Status: DC

## 2014-06-19 MED ORDER — PREDNISONE 20 MG PO TABS: 40.0000 mg | ORAL_TABLET | Freq: Every day | ORAL | Status: DC

## 2014-06-19 NOTE — Patient Instructions (Signed)
Change insulin to 30 units every morning and 10 units every evening. We need to recheck you in 48 hours. I've called in your new diabetes medications like to start them today.   Sciatica Sciatica is pain, weakness, numbness, or tingling along the path of the sciatic nerve. The nerve starts in the lower back and runs down the back of each leg. The nerve controls the muscles in the lower leg and in the back of the knee, while also providing sensation to the back of the thigh, lower leg, and the sole of your foot. Sciatica is a symptom of another medical condition. For instance, nerve damage or certain conditions, such as a herniated disk or bone spur on the spine, pinch or put pressure on the sciatic nerve. This causes the pain, weakness, or other sensations normally associated with sciatica. Generally, sciatica only affects one side of the body. CAUSES   Herniated or slipped disc.  Degenerative disk disease.  A pain disorder involving the narrow muscle in the buttocks (piriformis syndrome).  Pelvic injury or fracture.  Pregnancy.  Tumor (rare). SYMPTOMS  Symptoms can vary from mild to very severe. The symptoms usually travel from the low back to the buttocks and down the back of the leg. Symptoms can include:  Mild tingling or dull aches in the lower back, leg, or hip.  Numbness in the back of the calf or sole of the foot.  Burning sensations in the lower back, leg, or hip.  Sharp pains in the lower back, leg, or hip.  Leg weakness.  Severe back pain inhibiting movement. These symptoms may get worse with coughing, sneezing, laughing, or prolonged sitting or standing. Also, being overweight may worsen symptoms. DIAGNOSIS  Your caregiver will perform a physical exam to look for common symptoms of sciatica. He or she may ask you to do certain movements or activities that would trigger sciatic nerve pain. Other tests may be performed to find the cause of the sciatica. These may  include:  Blood tests.  X-rays.  Imaging tests, such as an MRI or CT scan. TREATMENT  Treatment is directed at the cause of the sciatic pain. Sometimes, treatment is not necessary and the pain and discomfort goes away on its own. If treatment is needed, your caregiver may suggest:  Over-the-counter medicines to relieve pain.  Prescription medicines, such as anti-inflammatory medicine, muscle relaxants, or narcotics.  Applying heat or ice to the painful area.  Steroid injections to lessen pain, irritation, and inflammation around the nerve.  Reducing activity during periods of pain.  Exercising and stretching to strengthen your abdomen and improve flexibility of your spine. Your caregiver may suggest losing weight if the extra weight makes the back pain worse.  Physical therapy.  Surgery to eliminate what is pressing or pinching the nerve, such as a bone spur or part of a herniated disk. HOME CARE INSTRUCTIONS   Only take over-the-counter or prescription medicines for pain or discomfort as directed by your caregiver.  Apply ice to the affected area for 20 minutes, 3-4 times a day for the first 48-72 hours. Then try heat in the same way.  Exercise, stretch, or perform your usual activities if these do not aggravate your pain.  Attend physical therapy sessions as directed by your caregiver.  Keep all follow-up appointments as directed by your caregiver.  Do not wear high heels or shoes that do not provide proper support.  Check your mattress to see if it is too soft. A firm  mattress may lessen your pain and discomfort. SEEK IMMEDIATE MEDICAL CARE IF:   You lose control of your bowel or bladder (incontinence).  You have increasing weakness in the lower back, pelvis, buttocks, or legs.  You have redness or swelling of your back.  You have a burning sensation when you urinate.  You have pain that gets worse when you lie down or awakens you at night.  Your pain is worse  than you have experienced in the past.  Your pain is lasting longer than 4 weeks.  You are suddenly losing weight without reason. MAKE SURE YOU:  Understand these instructions.  Will watch your condition.  Will get help right away if you are not doing well or get worse. Document Released: 03/02/2001 Document Revised: 09/07/2011 Document Reviewed: 07/18/2011 [facility] Patient Information 2015 [place], [place]. This information is not intended to replace advice given to you by your health care provider. Make sure you discuss any questions you have with your health care provider.

## 2014-06-19 NOTE — Progress Notes (Signed)
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 He describes pain as sharp, constant, worse with weightbearing, and beginning last Saturday. There was no precipitating trauma.  Patient does not have a history of low back pain. He denies fever, difficulty with urination, sensory loss in the leg, or motor loss in the leg.  Patient's other problem is diabetes. Currently he's taking 230 units of insulin daily. He's concerned because his weight has consistently increased and now his knees are hurting and he can't exercise. He had been on metformin in the past and his weight was coming down, but he was switched insulin to get better control. Now is having periods of hypoglycemia. He's had dramatic swings in his sugar from below 52 up to 300.  Both patient and spouse are frustrated with his diabetes.  Objective: Patient is alert and in no acute distress. His wife does much of the talking for him. He's morbidly obese.  Patient has positive straight-leg raising on each side, worse on the left. Palpation of the back reveals no local tenderness. He has no rash on his back. Reflexes are diminished symmetrically in the knees and ankles. He has good range of motion of his feet and no muscle wasting.  Patient does have 2+ pretibial edema bilaterally.  Results for orders placed or performed in visit on 06/19/14  POCT glucose (manual entry)  Result Value Ref Range   POC Glucose 166 (A) 70 - 99 mg/dl  POCT glycosylated hemoglobin (Hb A1C)  Result Value Ref Range   Hemoglobin A1C 10.1   POCT UA - Microscopic Only  Result Value Ref Range   WBC, Ur, HPF, POC neg    RBC, urine, microscopic neg    Bacteria, U Microscopic neg    Mucus, UA neg    Epithelial cells, urine per micros 0-1    Crystals, Ur, HPF, POC neg    Casts, Ur, LPF, POC neg    Yeast, UA neg   POCT urinalysis dipstick  Result Value Ref Range   Color, UA light yellow    Clarity, UA clear    Glucose, UA neg    Bilirubin, UA neg    Ketones, UA neg    Spec Grav, UA 1.010    Blood, UA neg    pH, UA 7.0    Protein, UA neg    Urobilinogen, UA 0.2    Nitrite, UA neg    Leukocytes, UA Negative    Assessment: Patient clearly is not doing well with respect to his diabetes. His massive use of insulin along with his progressive weight gain is consistent with his A1c of 10.1.  His weight is causing what appears to be a sciatic condition. After long discussion with the family, we decided to reduce his insulin and use other medications try to bring his sugar under control and prevent further waking.  Left-sided low back pain with left-sided sciatica - Plan: POCT UA - Microscopic Only, POCT urinalysis dipstick, predniSONE (DELTASONE) 20 MG tablet, HYDROcodone-acetaminophen (NORCO) 5-325 MG per tablet  Diabetes type 2, uncontrolled - Plan: POCT glucose (manual entry), POCT glycosylated hemoglobin (Hb A1C), metFORMIN (GLUCOPHAGE) 1000 MG tablet, Comprehensive metabolic panel, linagliptin (TRADJENTA) 5 MG TABS tablet, Ambulatory referral to Endocrinology  Recheck in 2 days  Robyn Haber, MD

## 2014-06-22 ENCOUNTER — Ambulatory Visit (INDEPENDENT_AMBULATORY_CARE_PROVIDER_SITE_OTHER): Payer: 59 | Admitting: Family Medicine

## 2014-06-22 VITALS — BP 136/80 | HR 71 | Temp 99.8°F | Resp 18 | Ht 66.5 in | Wt 289.9 lb

## 2014-06-22 DIAGNOSIS — M5432 Sciatica, left side: Secondary | ICD-10-CM

## 2014-06-22 DIAGNOSIS — E1165 Type 2 diabetes mellitus with hyperglycemia: Secondary | ICD-10-CM | POA: Diagnosis not present

## 2014-06-22 DIAGNOSIS — IMO0002 Reserved for concepts with insufficient information to code with codable children: Secondary | ICD-10-CM

## 2014-06-22 NOTE — Progress Notes (Signed)
Patient ID: Anthony Woods, male   DOB: 14-Aug-1955, 59 y.o.   MRN: [id number]   This chart was scribed for Robyn Haber, MD by Dellis Filbert, ED Scribe at [facility].The patient was seen in exam room 05 and the patient's care was started at 10:57 AM.  Patient ID: Anthony Woods MRN: [id number], DOB: 06-Jul-1955, 59 y.o. Date of Encounter: 06/22/2014,   Primary Physician: Walker Kehr, MD  Chief Complaint:  Chief Complaint  Patient presents with  . Follow-up   HPI:  Anthony Woods is a 59 y.o. male with a history of diabetes who presents to [facility] for follow up. His lower back pain has improved and he only has pain with bending over. Pt developed back pain march 25. He denies trauma and he does not believe it did not precipitate from lifting something heavy. Pt has history of diabetes he was 230 units of insulin daily and having hypoglycemic effects. He is currently taking 40 units of insulin daily. Pt's wife states he has loss of appetite. His blood sugar has been in the 250's, this morning it was 257. He is a Sport and exercise psychologist.  Past Medical History  Diagnosis Date  . CAD (coronary artery disease)   . Type II or unspecified type diabetes mellitus without mention of complication, not stated as uncontrolled   . HTN (hypertension)   . Hypothyroidism   . Allergy to bee sting   . Macular degeneration   . Hyperlipidemia   . Obesity   . Arthritis     Home Meds: Prior to Admission medications   Medication Sig Start Date End Date Taking? Authorizing Provider  aspirin 81 MG EC tablet Take 81 mg by mouth daily.     Yes Historical Provider, MD  atenolol-chlorthalidone (TENORETIC) 100-25 MG per tablet TAKE 1 TABLET BY MOUTH DAILY. 06/07/14  Yes [name], MD  diphenhydrAMINE (BENADRYL) 25 MG tablet Take 1 tablet (25 mg total) by mouth every 6 (six) hours. For 3 days 10/11/13  Yes Ernestina Patches, MD  EPINEPHrine (EPIPEN 2-PAK) 0.3 mg/0.3 mL  SOAJ injection Inject 0.3 mLs (0.3 mg total) into the muscle once. 11/15/12  Yes [name], MD  famotidine (PEPCID) 20 MG tablet Take 1 tablet (20 mg total) by mouth 2 (two) times daily. For 3 days 10/11/13  Yes Ernestina Patches, MD  glucagon (GLUCAGON EMERGENCY) 1 MG injection Inject 1 mg into the vein once as needed. 07/13/12  Yes Renato Shin, MD  glucose blood test strip 1 each by Other route See admin instructions. Check blood sugar 2 times daily.   Yes Historical Provider, MD  HYDROcodone-acetaminophen (NORCO) 5-325 MG per tablet Take 1 tablet by mouth every 6 (six) hours as needed for moderate pain. 06/19/14  Yes Robyn Haber, MD  Insulin NPH, Human,, Isophane, (HUMULIN N KWIKPEN) 100 UNIT/ML Kiwkpen Inject 230 Units into the skin every morning.    Yes Historical Provider, MD  Insulin Pen Needle (BD ULTRA-FINE PEN NEEDLES) 29G X 12.7MM MISC Use for insulin shots 01/29/14  Yes [name], MD  Insulin Pen Needle (PEN NEEDLES) 31G X 6 MM MISC 1 each by Other route See admin instructions. Use once daily with insulin pen.   Yes Historical Provider, MD  Lancets MISC 1 each by Other route See admin instructions. Check blood sugar 2 times daily.   Yes Historical Provider, MD  levothyroxine (SYNTHROID, LEVOTHROID) 175 MCG tablet Take 175 mcg by mouth  daily before breakfast.   Yes Historical Provider, MD  linagliptin (TRADJENTA) 5 MG TABS tablet Take 1 tablet (5 mg total) by mouth daily. 06/19/14  Yes Robyn Haber, MD  losartan (COZAAR) 100 MG tablet Take 100 mg by mouth daily.   Yes Historical Provider, MD  metFORMIN (GLUCOPHAGE) 1000 MG tablet Take 1 tablet (1,000 mg total) by mouth 2 (two) times daily with a meal. 06/19/14  Yes Robyn Haber, MD  potassium chloride (K-DUR) 10 MEQ tablet TAKE 1 TABLET BY MOUTH 2 TIMES DAILY. 02/04/14  Yes [name], MD  predniSONE (DELTASONE) 10 MG tablet Take 60 mg by mouth as needed. For bee stings   Yes Historical Provider, MD    predniSONE (DELTASONE) 20 MG tablet Take 2 tablets (40 mg total) by mouth daily. 06/19/14  Yes Robyn Haber, MD  simvastatin (ZOCOR) 40 MG tablet TAKE 1 TABLET BY MOUTH DAILY. 06/07/14  Yes Cassandria Anger, MD   Allergies:  Allergies  Allergen Reactions  . Actos [Pioglitazone Hydrochloride] Swelling  . Bee Venom Itching  . Lisinopril Other (See Comments)    cough   History   Social History  . Marital Status: Married    Spouse Name: N/A  . Number of Children: 1  . Years of Education: N/A   Occupational History  . [employer]   . 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 Smoking status: Current Every Day Smoker -- 2.00 packs/day for 40 years    Types: Cigarettes  . Smokeless tobacco: Never Used  . Alcohol Use: No  . Drug Use: No  . Sexual Activity: Yes   Other Topics Concern  . Not on file   Social History Narrative    Review of Systems: Constitutional: negative for chills, fever, night sweats, weight changes, or fatigue  HEENT: negative for vision changes, hearing loss, congestion, rhinorrhea, ST, epistaxis, or sinus pressure Cardiovascular: negative for chest pain or palpitations Respiratory: negative for hemoptysis, wheezing, shortness of breath, or cough Abdominal: negative for abdominal pain, nausea, vomiting, diarrhea, or constipation Dermatological: negative for rash Neurologic: negative for headache, dizziness, or syncope All other systems reviewed and are otherwise negative with the exception to those above and in the HPI.  Physical Exam: Blood pressure 136/80, pulse 71, temperature 99.8 F (37.7 C), temperature source Oral, resp. rate 18, height 5' 6.5" (1.689 m), weight 289 lb 14.4 oz (131.498 kg), SpO2 95 %., Body mass index is 46.1 kg/(m^2). General: Well developed, well nourished, in no acute distress. Head: Normocephalic, atraumatic, eyes without discharge, sclera non-icteric, nares are without discharge. Bilateral auditory canals clear, TM's  are without perforation, pearly grey and translucent with reflective cone of light bilaterally. Oral cavity moist, posterior pharynx without exudate, erythema, peritonsillar abscess, or post nasal drip.  Neck: Supple. No thyromegaly. Full ROM. No lymphadenopathy. Lungs: Clear bilaterally to auscultation without wheezes, rales, or rhonchi. Breathing is unlabored. Heart: RRR with S1 S2. No murmurs, rubs, or gallops appreciated. Abdomen: Soft, non-tender, non-distended with normoactive bowel sounds. No hepatomegaly. No rebound/guarding. No obvious abdominal masses. Msk:  Strength and tone normal for age. Extremities/Skin: Warm and dry. No clubbing or cyanosis. No edema. No rashes or suspicious lesions. Patient is moving much better with regards to his back. His reflexes are normal basal has some straight leg raising signs on the left. Neuro: Alert and oriented X 3. Moves all extremities spontaneously. Gait is normal. CNII-XII grossly in tact. Psych:  Responds to questions appropriately with a  normal affect.    ASSESSMENT AND PLAN:  59 y.o. year old male with type 2 diabetes which is poorly controlled at in no worse situation than he was several days ago. His back pain is much better.  I've asked patient to continue his current regimen until the.prednisone is complete. He's to do only light duty at work.  I've asked him to come back in 2 weeks to review the diabetes as well as the back pain.  This chart was scribed in my presence and reviewed by me personally.  This chart was scribed in my presence and reviewed by me personally.    ICD-9-CM ICD-10-CM   1. Sciatica, left 724.3 M54.32   2. Diabetes type 2, uncontrolled 250.02 E11.65      Signed, Robyn Haber, MD

## 2014-06-22 NOTE — Patient Instructions (Signed)
Continue current plan and return in 2 weeks

## 2014-07-06 ENCOUNTER — Ambulatory Visit (INDEPENDENT_AMBULATORY_CARE_PROVIDER_SITE_OTHER): Payer: 59 | Admitting: Family Medicine

## 2014-07-06 VITALS — BP 132/80 | HR 68 | Temp 98.0°F | Resp 18 | Wt 283.0 lb

## 2014-07-06 DIAGNOSIS — M5432 Sciatica, left side: Secondary | ICD-10-CM

## 2014-07-06 DIAGNOSIS — E1165 Type 2 diabetes mellitus with hyperglycemia: Secondary | ICD-10-CM | POA: Diagnosis not present

## 2014-07-06 DIAGNOSIS — IMO0002 Reserved for concepts with insufficient information to code with codable children: Secondary | ICD-10-CM

## 2014-07-06 NOTE — Progress Notes (Signed)
Patient ID: Anthony Woods, male   DOB: 03-06-1956, 59 y.o.   MRN: [id number]   This chart was scribed for Robyn Haber, MD by [facility], medical scribe at [facility].The patient was seen in exam room 06 and the patient's care was started at 9:06 AM.  Patient ID: Anthony Woods MRN: [id number], DOB: 03-19-1956, 59 y.o. Date of Encounter: 07/06/2014  Primary Physician: Walker Kehr, MD  Chief Complaint:  Chief Complaint  Patient presents with  . Follow-up   HPI:  Anthony Woods is a 59 y.o. male who presents to [facility] here for a follow up. Last seen on 06/22/2014 for sciatica nerve irration and elevated blood sugar. His back pain has improved with treatment of prednisone. He also has right knee for 5 years now but does have good strength.   His blood sugar today is 263 and has been averaging around 250. Appetite has returned to baseline, he is not eating constantly. Taking 40 units of insulin, he was previously taking 200 units of insulin. Pt dropped 10 pounds since last visit.   Past Medical History  Diagnosis Date  . CAD (coronary artery disease)   . Type II or unspecified type diabetes mellitus without mention of complication, not stated as uncontrolled   . HTN (hypertension)   . Hypothyroidism   . Allergy to bee sting   . Macular degeneration   . Hyperlipidemia   . Obesity   . Arthritis     Home Meds: Prior to Admission medications   Medication Sig Start Date End Date Taking? Authorizing Provider  aspirin 81 MG EC tablet Take 81 mg by mouth daily.     Yes Historical Provider, MD  atenolol-chlorthalidone (TENORETIC) 100-25 MG per tablet TAKE 1 TABLET BY MOUTH DAILY. 06/07/14  Yes [name], MD  diphenhydrAMINE (BENADRYL) 25 MG tablet Take 1 tablet (25 mg total) by mouth every 6 (six) hours. For 3 days 10/11/13  Yes Ernestina Patches, MD  EPINEPHrine (EPIPEN 2-PAK) 0.3 mg/0.3 mL SOAJ injection Inject 0.3 mLs (0.3 mg total)  into the muscle once. 11/15/12  Yes [name], MD  famotidine (PEPCID) 20 MG tablet Take 1 tablet (20 mg total) by mouth 2 (two) times daily. For 3 days 10/11/13  Yes Ernestina Patches, MD  glucagon (GLUCAGON EMERGENCY) 1 MG injection Inject 1 mg into the vein once as needed. 07/13/12  Yes Renato Shin, MD  glucose blood test strip 1 each by Other route See admin instructions. Check blood sugar 2 times daily.   Yes Historical Provider, MD  HYDROcodone-acetaminophen (NORCO) 5-325 MG per tablet Take 1 tablet by mouth every 6 (six) hours as needed for moderate pain. 06/19/14  Yes Robyn Haber, MD  Insulin NPH, Human,, Isophane, (HUMULIN N KWIKPEN) 100 UNIT/ML Kiwkpen Inject 230 Units into the skin every morning.    Yes Historical Provider, MD  Insulin Pen Needle (BD ULTRA-FINE PEN NEEDLES) 29G X 12.7MM MISC Use for insulin shots 01/29/14  Yes [name], MD  Insulin Pen Needle (PEN NEEDLES) 31G X 6 MM MISC 1 each by Other route See admin instructions. Use once daily with insulin pen.   Yes Historical Provider, MD  Lancets MISC 1 each by Other route See admin instructions. Check blood sugar 2 times daily.   Yes Historical Provider, MD  levothyroxine (SYNTHROID, LEVOTHROID) 175 MCG tablet Take 175 mcg by mouth daily before breakfast.   Yes Historical Provider, MD  linagliptin (  TRADJENTA) 5 MG TABS tablet Take 1 tablet (5 mg total) by mouth daily. 06/19/14  Yes Robyn Haber, MD  losartan (COZAAR) 100 MG tablet Take 100 mg by mouth daily.   Yes Historical Provider, MD  metFORMIN (GLUCOPHAGE) 1000 MG tablet Take 1 tablet (1,000 mg total) by mouth 2 (two) times daily with a meal. 06/19/14  Yes Robyn Haber, MD  potassium chloride (K-DUR) 10 MEQ tablet TAKE 1 TABLET BY MOUTH 2 TIMES DAILY. 02/04/14  Yes [name], MD  predniSONE (DELTASONE) 10 MG tablet Take 60 mg by mouth as needed. For bee stings   Yes Historical Provider, MD  predniSONE (DELTASONE) 20 MG tablet Take 2 tablets (40  mg total) by mouth daily. 06/19/14  Yes Robyn Haber, MD  simvastatin (ZOCOR) 40 MG tablet TAKE 1 TABLET BY MOUTH DAILY. 06/07/14  Yes Cassandria Anger, MD   Allergies:  Allergies  Allergen Reactions  . Actos [Pioglitazone Hydrochloride] Swelling  . Bee Venom Itching  . Lisinopril Other (See Comments)    cough   History   Social History  . Marital Status: Married    Spouse Name: N/A  . Number of Children: 1  . Years of Education: N/A   Occupational History  . [employer]   . 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 Smoking status: Current Every Day Smoker -- 2.00 packs/day for 40 years    Types: Cigarettes  . Smokeless tobacco: Never Used  . Alcohol Use: No  . Drug Use: No  . Sexual Activity: Yes   Other Topics Concern  . Not on file   Social History Narrative    Review of Systems: Constitutional: negative for chills, fever, night sweats, weight changes, or fatigue  HEENT: negative for vision changes, hearing loss, congestion, rhinorrhea, ST, epistaxis, or sinus pressure Cardiovascular: negative for chest pain or palpitations Respiratory: negative for hemoptysis, wheezing, shortness of breath, or cough Abdominal: negative for abdominal pain, nausea, vomiting, diarrhea, or constipation Msk: Positive for back pain Dermatological: negative for rash Neurologic: negative for headache, dizziness, or syncope All other systems reviewed and are otherwise negative with the exception to those above and in the HPI.  Physical Exam: Blood pressure 132/80, pulse 68, temperature 98 F (36.7 C), temperature source Oral, resp. rate 18, weight 283 lb (128.368 kg), SpO2 94 %., Body mass index is 45 kg/(m^2). General: Well developed, well nourished, in no acute distress. Head: Normocephalic, atraumatic, eyes without discharge, sclera non-icteric, nares are without discharge. Bilateral auditory canals clear, TM's are without perforation, pearly grey and translucent with  reflective cone of light bilaterally. Oral cavity moist, posterior pharynx without exudate, erythema, peritonsillar abscess, or post nasal drip.  Neck: Supple. No thyromegaly. Full ROM. No lymphadenopathy. Lungs: Clear bilaterally to auscultation without wheezes, rales, or rhonchi. Breathing is unlabored. Heart: RRR with S1 S2. No murmurs, rubs, or gallops appreciated. Abdomen: Soft, non-tender, non-distended with normoactive bowel sounds. No hepatomegaly. No rebound/guarding. No obvious abdominal masses. Msk:  Strength and tone normal for age. Straight leg raise is normal Extremities/Skin: Warm and dry. No clubbing or cyanosis. No edema. No rashes or suspicious lesions. Neuro: Alert and oriented X 3. Moves all extremities spontaneously. Gait is normal. CNII-XII grossly in tact. Psych:  Responds to questions appropriately with a normal affect.   Labs:  ASSESSMENT AND PLAN:  60 y.o. year old male with sciatica follow up and type 2 diabetes uncontrolled. Pt was asked to come back in a month.  Pt was advised to return to work on his next scheduled shift.  This chart was scribed in my presence and reviewed by me personally.    ICD-9-CM ICD-10-CM   1. Sciatica, left 724.3 M54.32 Ambulatory referral to Endocrinology  2. Type 2 diabetes mellitus, uncontrolled 250.02 E11.65      Signed, Robyn Haber, MD  Signed, Robyn Haber, MD 07/06/2014 9:06 AM

## 2014-07-12 ENCOUNTER — Encounter: Payer: Self-pay | Admitting: Endocrinology

## 2014-07-29 ENCOUNTER — Other Ambulatory Visit: Payer: Self-pay | Admitting: Internal Medicine

## 2014-08-30 ENCOUNTER — Other Ambulatory Visit: Payer: Self-pay | Admitting: Internal Medicine

## 2014-09-11 ENCOUNTER — Telehealth: Payer: Self-pay | Admitting: *Deleted

## 2014-09-11 NOTE — Telephone Encounter (Signed)
Left message for patient to get a1c checked 

## 2014-11-11 ENCOUNTER — Encounter (HOSPITAL_COMMUNITY): Payer: Self-pay | Admitting: *Deleted

## 2014-11-11 ENCOUNTER — Emergency Department (HOSPITAL_COMMUNITY)
Admission: EM | Admit: 2014-11-11 | Discharge: 2014-11-11 | Disposition: A | Discharge status: Home / Self Care | Payer: Worker's Compensation | Attending: Emergency Medicine | Admitting: Emergency Medicine

## 2014-11-11 DIAGNOSIS — Z7952 Long term (current) use of systemic steroids: Secondary | ICD-10-CM | POA: Insufficient documentation

## 2014-11-11 DIAGNOSIS — I2510 Atherosclerotic heart disease of native coronary artery without angina pectoris: Secondary | ICD-10-CM | POA: Insufficient documentation

## 2014-11-11 DIAGNOSIS — T63441A Toxic effect of venom of bees, accidental (unintentional), initial encounter: Secondary | ICD-10-CM | POA: Insufficient documentation

## 2014-11-11 DIAGNOSIS — Z794 Long term (current) use of insulin: Secondary | ICD-10-CM | POA: Insufficient documentation

## 2014-11-11 DIAGNOSIS — E669 Obesity, unspecified: Secondary | ICD-10-CM | POA: Diagnosis not present

## 2014-11-11 DIAGNOSIS — Z720 Tobacco use: Secondary | ICD-10-CM | POA: Insufficient documentation

## 2014-11-11 DIAGNOSIS — Z79899 Other long term (current) drug therapy: Secondary | ICD-10-CM | POA: Diagnosis not present

## 2014-11-11 DIAGNOSIS — M1990 Unspecified osteoarthritis, unspecified site: Secondary | ICD-10-CM | POA: Diagnosis not present

## 2014-11-11 DIAGNOSIS — E785 Hyperlipidemia, unspecified: Secondary | ICD-10-CM | POA: Insufficient documentation

## 2014-11-11 DIAGNOSIS — Z7982 Long term (current) use of aspirin: Secondary | ICD-10-CM | POA: Diagnosis not present

## 2014-11-11 DIAGNOSIS — E119 Type 2 diabetes mellitus without complications: Secondary | ICD-10-CM | POA: Diagnosis not present

## 2014-11-11 DIAGNOSIS — E039 Hypothyroidism, unspecified: Secondary | ICD-10-CM | POA: Diagnosis not present

## 2014-11-11 DIAGNOSIS — I10 Essential (primary) hypertension: Secondary | ICD-10-CM | POA: Diagnosis not present

## 2014-11-11 DIAGNOSIS — Z91030 Bee allergy status: Secondary | ICD-10-CM

## 2014-11-11 MED ORDER — PREDNISONE 10 MG PO TABS: 40.0000 mg | ORAL_TABLET | Freq: Every day | ORAL | Status: DC

## 2014-11-11 MED ORDER — EPINEPHRINE 0.3 MG/0.3ML IJ SOAJ: 0.3000 mg | Freq: Once | INTRAMUSCULAR | Status: DC

## 2014-11-11 MED ORDER — PREDNISONE 20 MG PO TABS
60.0000 mg | ORAL_TABLET | Freq: Once | ORAL | Status: AC
  Administered 2014-11-11: 60 mg via ORAL
  Filled 2014-11-11: qty 3

## 2014-11-11 MED ORDER — DIPHENHYDRAMINE HCL 25 MG PO CAPS
25.0000 mg | ORAL_CAPSULE | Freq: Once | ORAL | Status: AC
  Administered 2014-11-11: 25 mg via ORAL
  Filled 2014-11-11: qty 1

## 2014-11-11 MED ORDER — DIPHENHYDRAMINE HCL 25 MG PO TABS: 25.0000 mg | ORAL_TABLET | Freq: Four times a day (QID) | ORAL | Status: DC

## 2014-11-11 MED ORDER — FAMOTIDINE 20 MG PO TABS
40.0000 mg | ORAL_TABLET | Freq: Once | ORAL | Status: AC
  Administered 2014-11-11: 40 mg via ORAL
  Filled 2014-11-11: qty 2

## 2014-11-11 MED ORDER — FAMOTIDINE 20 MG PO TABS: 20.0000 mg | ORAL_TABLET | Freq: Two times a day (BID) | ORAL | Status: DC

## 2014-11-11 NOTE — Discharge Instructions (Signed)
Anaphylactic Reaction An anaphylactic reaction is a sudden, severe allergic reaction. It affects the whole body. It can be life threatening. You may need to stay in the hospital.  [place] a medical bracelet or necklace that lists your allergy.  Carry your allergy kit or medicine shot to treat severe allergic reactions with you. These can save your life.  Do not drive until medicine from your shot has worn off, unless your doctor says it is okay.  If you have hives or a rash:  Take medicine as told by your doctor.  You may take over-the-counter antihistamine medicine.  Place cold cloths on your skin. Take baths in cool water. Avoid hot baths and hot showers. GET HELP RIGHT AWAY IF:   Your mouth is puffy (swollen), or you have trouble breathing.  You start making whistling sounds when you breathe (wheezing).  You have a tight feeling in your chest or throat.  You have a rash, hives, puffiness, or itching on your body.  You throw up (vomit) or have watery poop (diarrhea).  You feel dizzy or pass out (faint).  You think you are having an allergic reaction.  You have new symptoms. This is an emergency. Use your medicine shot or allergy kit as told. Call your local emergency services (911 in [place] Even if you feel better after the shot, you need to go to the hospital emergency department. MAKE SURE YOU:   Understand these instructions.  Will watch your condition.  Will get help right away if you are not doing well or get worse. Document Released: 08/25/2007 Document Revised: 09/07/2011 Document Reviewed: 06/09/2011 [facility] Patient Information 2015 [place], [place]. This information is not intended to replace advice given to you by your health care provider. Make sure you discuss any questions you have with your health care provider.  Take the Benadryl as directed for the next 2 days. Take Pepcid for 7 days. Take the prednisone for 5 days. EpiPen is been renewed. Return  for any new or worse symptoms.

## 2014-11-11 NOTE — ED Provider Notes (Signed)
CSN: [id number]     Arrival date & time 11/11/14  1431 History   First MD Initiated Contact with Patient 11/11/14 1512     Chief Complaint  Patient presents with  . Insect Bite     (Consider location/radiation/quality/duration/timing/severity/associated sxs/prior Treatment) The history is provided by the patient and the spouse.   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 Patient injected himself with 2 epi-pens immediately following the stenting. Sting occurred around 2:00 in the afternoon. Patient never developed any significant symptoms but does have local tenderness redness and swelling to the right forearm at the site of the bee sting. Patient never had any tongue swelling lip swelling trouble breathing lightheadedness or feeling like he was given a pass out or rash.  Past Medical History  Diagnosis Date  . CAD (coronary artery disease)   . Type II or unspecified type diabetes mellitus without mention of complication, not stated as uncontrolled   . HTN (hypertension)   . Hypothyroidism   . Allergy to bee sting   . Macular degeneration   . Hyperlipidemia   . Obesity   . Arthritis    Past Surgical History  Procedure Laterality Date  . Knee surgery      Meniscus removal  . Wrist ganglion excision      left  . Left heart catheterization with coronary angiogram N/A 02/04/2012    Procedure: LEFT HEART CATHETERIZATION WITH CORONARY ANGIOGRAM;  Surgeon: Burnell Blanks, MD;  Location: [facility] CATH LAB;  Service: Cardiovascular;  Laterality: N/A;   Family History  Problem Relation Age of Onset  . Hypertension Other   . Diabetes Father   . Cancer Father     stomach  . CVA Father   . Stomach cancer Father   . Diabetes Mother   . Cancer Mother     male ca  . Colon cancer Mother 59  . Diabetes Other   . CAD Maternal Grandmother    Social History  Substance Use Topics  . Smoking status: Current Every  Day Smoker -- 2.00 packs/day for 40 years    Types: Cigarettes  . Smokeless tobacco: Never Used  . Alcohol Use: No    Review of Systems  Constitutional: Negative for fever.  HENT: Negative for congestion.   Eyes: Negative for visual disturbance.  Respiratory: Negative for shortness of breath.   Cardiovascular: Negative for chest pain.  Gastrointestinal: Negative for abdominal pain.  Genitourinary: Negative for hematuria.  Skin: Negative for rash.  Neurological: Negative for syncope and light-headedness.  Hematological: Does not bruise/bleed easily.  Psychiatric/Behavioral: Negative for confusion.      Allergies  Bee venom; Actos; and Lisinopril  Home Medications   Prior to Admission medications   Medication Sig Start Date End Date Taking? Authorizing Provider  acetaminophen (TYLENOL) 650 MG CR tablet Take 1,300 mg by mouth every 8 (eight) hours as needed for pain.   Yes Historical Provider, MD  aspirin 81 MG EC tablet Take 81 mg by mouth daily.     Yes Historical Provider, MD  atenolol-chlorthalidone (TENORETIC) 100-25 MG per tablet TAKE 1 TABLET BY MOUTH DAILY. 06/07/14  Yes [name], MD  diphenhydrAMINE (BENADRYL) 25 MG tablet Take 1 tablet (25 mg total) by mouth every 6 (six) hours. For 3 days 10/11/13  Yes Ernestina Patches, MD  EPINEPHrine (EPIPEN 2-PAK) 0.3 mg/0.3 mL SOAJ injection Inject 0.3 mLs (0.3 mg total) into the muscle  once. 11/15/12  Yes [name], MD  famotidine (PEPCID) 20 MG tablet Take 1 tablet (20 mg total) by mouth 2 (two) times daily. For 3 days 10/11/13  Yes Ernestina Patches, MD  glucagon (GLUCAGON EMERGENCY) 1 MG injection Inject 1 mg into the vein once as needed. 07/13/12  Yes Renato Shin, MD  insulin lispro protamine-lispro (HUMALOG 75/25 MIX) (75-25) 100 UNIT/ML SUSP injection Inject 20-40 Units into the skin 2 (two) times daily with a meal. Takes 40 units in am and 20 units in pm   Yes Historical Provider, MD  levothyroxine (SYNTHROID,  LEVOTHROID) 175 MCG tablet TAKE 1 TABLET BY MOUTH DAILY. 07/29/14  Yes [name], MD  linagliptin (TRADJENTA) 5 MG TABS tablet Take 1 tablet (5 mg total) by mouth daily. 06/19/14  Yes Robyn Haber, MD  losartan (COZAAR) 100 MG tablet Take 100 mg by mouth daily.   Yes Historical Provider, MD  losartan (COZAAR) 100 MG tablet TAKE 1 TABLET BY MOUTH DAILY. 09/02/14  Yes [name], MD  metFORMIN (GLUCOPHAGE) 1000 MG tablet Take 1 tablet (1,000 mg total) by mouth 2 (two) times daily with a meal. 06/19/14  Yes Robyn Haber, MD  potassium chloride (K-DUR) 10 MEQ tablet TAKE 1 TABLET BY MOUTH 2 TIMES DAILY. 02/04/14  Yes [name], MD  simvastatin (ZOCOR) 40 MG tablet TAKE 1 TABLET BY MOUTH DAILY. 06/07/14  Yes [name], MD  diphenhydrAMINE (BENADRYL) 25 MG tablet Take 1 tablet (25 mg total) by mouth every 6 (six) hours. 11/11/14   Fredia Sorrow, MD  EPINEPHrine 0.3 mg/0.3 mL IJ SOAJ injection Inject 0.3 mLs (0.3 mg total) into the muscle once. 11/11/14   Fredia Sorrow, MD  famotidine (PEPCID) 20 MG tablet Take 1 tablet (20 mg total) by mouth 2 (two) times daily. 11/11/14   Fredia Sorrow, MD  HYDROcodone-acetaminophen (NORCO) 5-325 MG per tablet Take 1 tablet by mouth every 6 (six) hours as needed for moderate pain. 06/19/14   Robyn Haber, MD  Insulin Pen Needle (BD ULTRA-FINE PEN NEEDLES) 29G X 12.7MM MISC Use for insulin shots 01/29/14   [name], MD  predniSONE (DELTASONE) 10 MG tablet Take 4 tablets (40 mg total) by mouth daily. 11/11/14   Fredia Sorrow, MD  predniSONE (DELTASONE) 20 MG tablet Take 2 tablets (40 mg total) by mouth daily. 06/19/14   Robyn Haber, MD   BP 119/74 mmHg  Pulse 64  Temp(Src) 98 F (36.7 C) (Oral)  Resp 19  Wt 267 lb 5 oz (121.252 kg)  SpO2 94% Physical Exam  Constitutional: He is oriented to person, place, and time. He appears well-developed and well-nourished. No distress.  HENT:  Head: Normocephalic and  atraumatic.  Mouth/Throat: Oropharynx is clear and moist.  Eyes: Conjunctivae and EOM are normal. Pupils are equal, round, and reactive to light.  Neck: Normal range of motion. Neck supple.  Cardiovascular: Normal rate, regular rhythm and normal heart sounds.   No murmur heard. Pulmonary/Chest: Effort normal and breath sounds normal. No respiratory distress. He has no wheezes.  Abdominal: Soft. Bowel sounds are normal. There is no tenderness.  Musculoskeletal: Normal range of motion. He exhibits edema and tenderness.  Right forearm at the site of the bee sting with redness and swelling and some mild tenderness to palpation the redness and swelling measures about 7 cm. No hives.  Neurological: He is alert and oriented to person, place, and time. No cranial nerve deficit. He exhibits normal muscle tone. Coordination normal.  Skin: Skin is warm. No rash noted. There is erythema.  Nursing note and vitals reviewed.   ED Course  Procedures (including critical care time) Labs Review Labs Reviewed - No data to display  Imaging Review No results found. I have personally reviewed and evaluated these images and lab results as part of my medical decision-making.   EKG Interpretation None      MDM   Final diagnoses:  Bee sting allergy  Bee sting reaction, accidental or unintentional, initial encounter    Patient with a history of significant bee allergy. Was stung in the right forearm at about 2:00 this afternoon. Patient started getting swelling in his right forearm injected himself with his EpiPen 2. Patient had a history in the past of anaphylaxis to bee stings.  The patient never developed any significant symptoms following the EpiPen's. Note lip swelling no tongue swelling no trouble breathing no wheezing no chest pain no lightheadedness.  In the emergency department patient was given Benadryl Pepcid and prednisone orally and monitored no significant development of  symptoms.    Fredia Sorrow, MD 11/11/14 1730

## 2014-11-11 NOTE — ED Notes (Signed)
Patient was stung by a bee today on the right forearm.  Patient used epi pens x 2.  He has swelling in the right forearm.  He is having itching in his hands and tingling in his toes.  Patient denies any sob.  He has hx of anaphylaxis to bees

## 2014-11-11 NOTE — ED Notes (Signed)
MD at bedside. 

## 2014-12-09 ENCOUNTER — Ambulatory Visit (INDEPENDENT_AMBULATORY_CARE_PROVIDER_SITE_OTHER): Payer: Commercial Managed Care - HMO | Admitting: Internal Medicine

## 2014-12-09 ENCOUNTER — Encounter: Payer: Self-pay | Admitting: Internal Medicine

## 2014-12-09 ENCOUNTER — Other Ambulatory Visit (INDEPENDENT_AMBULATORY_CARE_PROVIDER_SITE_OTHER): Payer: Commercial Managed Care - HMO

## 2014-12-09 VITALS — BP 138/80 | HR 68 | Wt 261.0 lb

## 2014-12-09 DIAGNOSIS — I10 Essential (primary) hypertension: Secondary | ICD-10-CM

## 2014-12-09 DIAGNOSIS — E038 Other specified hypothyroidism: Secondary | ICD-10-CM | POA: Diagnosis not present

## 2014-12-09 DIAGNOSIS — M1731 Unilateral post-traumatic osteoarthritis, right knee: Secondary | ICD-10-CM

## 2014-12-09 DIAGNOSIS — I2510 Atherosclerotic heart disease of native coronary artery without angina pectoris: Secondary | ICD-10-CM

## 2014-12-09 DIAGNOSIS — E1165 Type 2 diabetes mellitus with hyperglycemia: Secondary | ICD-10-CM

## 2014-12-09 DIAGNOSIS — Z23 Encounter for immunization: Secondary | ICD-10-CM | POA: Diagnosis not present

## 2014-12-09 DIAGNOSIS — IMO0002 Reserved for concepts with insufficient information to code with codable children: Secondary | ICD-10-CM

## 2014-12-09 DIAGNOSIS — M1711 Unilateral primary osteoarthritis, right knee: Secondary | ICD-10-CM | POA: Insufficient documentation

## 2014-12-09 DIAGNOSIS — E034 Atrophy of thyroid (acquired): Secondary | ICD-10-CM

## 2014-12-09 LAB — HEPATIC FUNCTION PANEL
ALBUMIN: 4.1 g/dL (ref 3.5–5.2)
ALT: 14 U/L (ref 0–53)
AST: 14 U/L (ref 0–37)
Alkaline Phosphatase: 55 U/L (ref 39–117)
Bilirubin, Direct: 0.1 mg/dL (ref 0.0–0.3)
TOTAL PROTEIN: 7 g/dL (ref 6.0–8.3)
Total Bilirubin: 0.3 mg/dL (ref 0.2–1.2)

## 2014-12-09 LAB — BASIC METABOLIC PANEL
BUN: 11 mg/dL (ref 6–23)
CALCIUM: 10.1 mg/dL (ref 8.4–10.5)
CO2: 30 mEq/L (ref 19–32)
CREATININE: 0.97 mg/dL (ref 0.40–1.50)
Chloride: 99 mEq/L (ref 96–112)
GFR: 84.37 mL/min (ref >60.00)
GLUCOSE: 240 mg/dL — AB (ref 70–99)
Potassium: 3.9 mEq/L (ref 3.5–5.1)
Sodium: 136 mEq/L (ref 135–145)

## 2014-12-09 LAB — LIPID PANEL
CHOLESTEROL: 153 mg/dL (ref 0–200)
HDL: 33.7 mg/dL — AB (ref >39.00)
NonHDL: 119.31
TRIGLYCERIDES: 246 mg/dL — AB (ref 0.0–149.0)
Total CHOL/HDL Ratio: 5
VLDL: 49.2 mg/dL — ABNORMAL HIGH (ref 0.0–40.0)

## 2014-12-09 LAB — TSH: TSH: 1.08 u[IU]/mL (ref 0.35–4.50)

## 2014-12-09 LAB — HEMOGLOBIN A1C: Hgb A1c MFr Bld: 9.6 % — ABNORMAL HIGH (ref 4.6–6.5)

## 2014-12-09 LAB — LDL CHOLESTEROL, DIRECT: Direct LDL: 100 mg/dL

## 2014-12-09 NOTE — Assessment & Plan Note (Signed)
Humalog mix, Metformin 

## 2014-12-09 NOTE — Assessment & Plan Note (Signed)
Pt declined inection

## 2014-12-09 NOTE — Assessment & Plan Note (Signed)
On ASA, Tenormin

## 2014-12-09 NOTE — Progress Notes (Signed)
Pre visit review using our clinic review tool, if applicable. No additional management support is needed unless otherwise documented below in the visit note. 

## 2014-12-09 NOTE — Assessment & Plan Note (Signed)
Losartan, Tenoretic 

## 2014-12-09 NOTE — Progress Notes (Signed)
Subjective:  Patient ID: Anthony Woods, male    DOB: 10/30/1955  Age: 59 y.o. MRN: [id number]  CC: No chief complaint on file.   HPI Anthony Woods presents for DM, HTN, OA f/u  Outpatient Prescriptions Prior to Visit  Medication Sig Dispense Refill  . acetaminophen (TYLENOL) 650 MG CR tablet Take 1,300 mg by mouth every 8 (eight) hours as needed for pain.    Marland Kitchen aspirin 81 MG EC tablet Take 81 mg by mouth daily.      Marland Kitchen atenolol-chlorthalidone (TENORETIC) 100-25 MG per tablet TAKE 1 TABLET BY MOUTH DAILY. 30 tablet 5  . diphenhydrAMINE (BENADRYL) 25 MG tablet Take 1 tablet (25 mg total) by mouth every 6 (six) hours. 20 tablet 0  . EPINEPHrine (EPIPEN 2-PAK) 0.3 mg/0.3 mL SOAJ injection Inject 0.3 mLs (0.3 mg total) into the muscle once. 2 Device 1  . EPINEPHrine 0.3 mg/0.3 mL IJ SOAJ injection Inject 0.3 mLs (0.3 mg total) into the muscle once. 2 Device 0  . famotidine (PEPCID) 20 MG tablet Take 1 tablet (20 mg total) by mouth 2 (two) times daily. 30 tablet 0  . glucagon (GLUCAGON EMERGENCY) 1 MG injection Inject 1 mg into the vein once as needed. 1 each 4  . HYDROcodone-acetaminophen (NORCO) 5-325 MG per tablet Take 1 tablet by mouth every 6 (six) hours as needed for moderate pain. 12 tablet 0  . insulin lispro protamine-lispro (HUMALOG 75/25 MIX) (75-25) 100 UNIT/ML SUSP injection Inject 20-40 Units into the skin 2 (two) times daily with a meal. Takes 40 units in am and 20 units in pm    . Insulin Pen Needle (BD ULTRA-FINE PEN NEEDLES) 29G X 12.7MM MISC Use for insulin shots 100 each 11  . levothyroxine (SYNTHROID, LEVOTHROID) 175 MCG tablet TAKE 1 TABLET BY MOUTH DAILY. 90 tablet 3  . linagliptin (TRADJENTA) 5 MG TABS tablet Take 1 tablet (5 mg total) by mouth daily. 30 tablet 5  . losartan (COZAAR) 100 MG tablet TAKE 1 TABLET BY MOUTH DAILY. 90 tablet 3  . metFORMIN (GLUCOPHAGE) 1000 MG tablet Take 1 tablet (1,000 mg total) by mouth 2 (two) times daily with a meal. 180 tablet 3  .  potassium chloride (K-DUR) 10 MEQ tablet TAKE 1 TABLET BY MOUTH 2 TIMES DAILY. 60 tablet 11  . predniSONE (DELTASONE) 10 MG tablet Take 4 tablets (40 mg total) by mouth daily. 20 tablet 0  . predniSONE (DELTASONE) 20 MG tablet Take 2 tablets (40 mg total) by mouth daily. 10 tablet 0  . simvastatin (ZOCOR) 40 MG tablet TAKE 1 TABLET BY MOUTH DAILY. 30 tablet 5  . diphenhydrAMINE (BENADRYL) 25 MG tablet Take 1 tablet (25 mg total) by mouth every 6 (six) hours. For 3 days 12 tablet 0  . famotidine (PEPCID) 20 MG tablet Take 1 tablet (20 mg total) by mouth 2 (two) times daily. For 3 days 6 tablet 0  . losartan (COZAAR) 100 MG tablet Take 100 mg by mouth daily.     No facility-administered medications prior to visit.    ROS Review of Systems  Constitutional: Negative for appetite change, fatigue and unexpected weight change.  HENT: Negative for congestion, nosebleeds, sneezing, sore throat and trouble swallowing.   Eyes: Negative for itching and visual disturbance.  Respiratory: Negative for cough.   Cardiovascular: Negative for chest pain, palpitations and leg swelling.  Gastrointestinal: Negative for nausea, diarrhea, blood in stool and abdominal distention.  Genitourinary: Negative for frequency and hematuria.  Musculoskeletal:  Positive for arthralgias and gait problem. Negative for back pain, joint swelling and neck pain.  Skin: Negative for rash.  Neurological: Negative for dizziness, tremors, speech difficulty and weakness.  Psychiatric/Behavioral: Negative for sleep disturbance, dysphoric mood and agitation. The patient is not nervous/anxious.     Objective:  BP 138/80 mmHg  Pulse 68  Wt 261 lb (118.389 kg)  SpO2 92%  BP Readings from Last 3 Encounters:  12/09/14 138/80  11/11/14 119/74  07/06/14 132/80    Wt Readings from Last 3 Encounters:  12/09/14 261 lb (118.389 kg)  11/11/14 267 lb 5 oz (121.252 kg)  07/06/14 283 lb (128.368 kg)    Physical Exam  Constitutional:  He is oriented to person, place, and time. He appears well-developed. No distress.  NAD  HENT:  Mouth/Throat: Oropharynx is clear and moist.  Eyes: Conjunctivae are normal. Pupils are equal, round, and reactive to light.  Neck: Normal range of motion. No JVD present. No thyromegaly present.  Cardiovascular: Normal rate, regular rhythm, normal heart sounds and intact distal pulses.  Exam reveals no gallop and no friction rub.   No murmur heard. Pulmonary/Chest: Effort normal and breath sounds normal. No respiratory distress. He has no wheezes. He has no rales. He exhibits no tenderness.  Abdominal: Soft. Bowel sounds are normal. He exhibits no distension and no mass. There is no tenderness. There is no rebound and no guarding.  Musculoskeletal: Normal range of motion. He exhibits no edema or tenderness.  Lymphadenopathy:    He has no cervical adenopathy.  Neurological: He is alert and oriented to person, place, and time. He has normal reflexes. No cranial nerve deficit. He exhibits normal muscle tone. He displays a negative Romberg sign. Coordination and gait normal.  Skin: Skin is warm and dry. No rash noted.  Psychiatric: He has a normal mood and affect. His behavior is normal. Judgment and thought content normal.  Obese R knee w/painful ROM  Lab Results  Component Value Date   WBC 9.1 02/01/2012   HGB 15.6 02/01/2012   HCT 47.0 02/01/2012   PLT 175.0 02/01/2012   GLUCOSE 264* 09/25/2013   CHOL 191 12/06/2013   TRIG 287.0* 12/06/2013   HDL 32.60* 12/06/2013   LDLDIRECT 124.8 12/06/2013   LDLCALC 114* 01/06/2012   ALT 19 01/06/2012   AST 20 01/06/2012   NA 137 09/25/2013   K 3.0* 09/25/2013   CL 97 09/25/2013   CREATININE 1.0 09/25/2013   BUN 16 09/25/2013   CO2 38* 09/25/2013   TSH 0.55 06/08/2011   PSA 0.30 06/08/2011   INR 1.0 02/01/2012   HGBA1C 10.1 06/19/2014   MICROALBUR 4.7* 12/06/2013    No results found.  Assessment & Plan:   There are no diagnoses linked  to this encounter. I am having Mr. Linhart maintain his aspirin, glucagon, EPINEPHrine, Insulin Pen Needle, potassium chloride, simvastatin, atenolol-chlorthalidone, metFORMIN, linagliptin, predniSONE, HYDROcodone-acetaminophen, levothyroxine, losartan, insulin lispro protamine-lispro, acetaminophen, predniSONE, diphenhydrAMINE, famotidine, and EPINEPHrine.  No orders of the defined types were placed in this encounter.     Follow-up: No Follow-up on file.  Walker Kehr, MD

## 2014-12-09 NOTE — Assessment & Plan Note (Signed)
Levothroid °Labs °

## 2014-12-12 MED ORDER — PREDNISONE 10 MG PO TABS: 40.0000 mg | ORAL_TABLET | Freq: Every day | ORAL | Status: DC

## 2015-01-27 ENCOUNTER — Other Ambulatory Visit: Payer: Self-pay | Admitting: Internal Medicine

## 2015-02-12 ENCOUNTER — Other Ambulatory Visit: Payer: Self-pay | Admitting: Internal Medicine

## 2015-03-05 ENCOUNTER — Other Ambulatory Visit: Payer: Self-pay | Admitting: Endocrinology

## 2015-04-14 ENCOUNTER — Other Ambulatory Visit (INDEPENDENT_AMBULATORY_CARE_PROVIDER_SITE_OTHER): Payer: Commercial Managed Care - HMO

## 2015-04-14 ENCOUNTER — Ambulatory Visit (INDEPENDENT_AMBULATORY_CARE_PROVIDER_SITE_OTHER): Payer: Commercial Managed Care - HMO | Admitting: Internal Medicine

## 2015-04-14 ENCOUNTER — Encounter: Payer: Self-pay | Admitting: Internal Medicine

## 2015-04-14 VITALS — BP 138/80 | HR 66 | Ht 66.0 in | Wt 261.0 lb

## 2015-04-14 DIAGNOSIS — IMO0002 Reserved for concepts with insufficient information to code with codable children: Secondary | ICD-10-CM

## 2015-04-14 DIAGNOSIS — E1165 Type 2 diabetes mellitus with hyperglycemia: Secondary | ICD-10-CM | POA: Diagnosis not present

## 2015-04-14 DIAGNOSIS — E1159 Type 2 diabetes mellitus with other circulatory complications: Secondary | ICD-10-CM

## 2015-04-14 DIAGNOSIS — R079 Chest pain, unspecified: Secondary | ICD-10-CM | POA: Insufficient documentation

## 2015-04-14 DIAGNOSIS — I2510 Atherosclerotic heart disease of native coronary artery without angina pectoris: Secondary | ICD-10-CM

## 2015-04-14 DIAGNOSIS — F17200 Nicotine dependence, unspecified, uncomplicated: Secondary | ICD-10-CM

## 2015-04-14 DIAGNOSIS — R0789 Other chest pain: Secondary | ICD-10-CM

## 2015-04-14 DIAGNOSIS — Z0000 Encounter for general adult medical examination without abnormal findings: Secondary | ICD-10-CM

## 2015-04-14 DIAGNOSIS — R350 Frequency of micturition: Secondary | ICD-10-CM

## 2015-04-14 LAB — URINALYSIS, ROUTINE W REFLEX MICROSCOPIC
BILIRUBIN URINE: NEGATIVE
Hgb urine dipstick: NEGATIVE
KETONES UR: NEGATIVE
Leukocytes, UA: NEGATIVE
Nitrite: NEGATIVE
PH: 6.5 (ref 5.0–8.0)
RBC / HPF: NONE SEEN (ref 0–?)
SPECIFIC GRAVITY, URINE: 1.01 (ref 1.000–1.030)
TOTAL PROTEIN, URINE-UPE24: 30 — AB
UROBILINOGEN UA: 0.2 (ref 0.0–1.0)
Urine Glucose: NEGATIVE

## 2015-04-14 LAB — LIPID PANEL
CHOL/HDL RATIO: 5
CHOLESTEROL: 170 mg/dL (ref 0–200)
HDL: 37.6 mg/dL — AB (ref >39.00)
LDL Cholesterol: 100 mg/dL — ABNORMAL HIGH (ref 0–99)
NonHDL: 132.89
TRIGLYCERIDES: 163 mg/dL — AB (ref 0.0–149.0)
VLDL: 32.6 mg/dL (ref 0.0–40.0)

## 2015-04-14 LAB — CBC WITH DIFFERENTIAL/PLATELET
BASOS ABS: 0 10*3/uL (ref 0.0–0.1)
Basophils Relative: 0.4 % (ref 0.0–3.0)
EOS ABS: 0.2 10*3/uL (ref 0.0–0.7)
Eosinophils Relative: 1.5 % (ref 0.0–5.0)
HEMATOCRIT: 50.8 % (ref 39.0–52.0)
Hemoglobin: 16.9 g/dL (ref 13.0–17.0)
LYMPHS PCT: 19.6 % (ref 12.0–46.0)
Lymphs Abs: 2.4 10*3/uL (ref 0.7–4.0)
MCHC: 33.2 g/dL (ref 30.0–36.0)
MCV: 93.1 fl (ref 78.0–100.0)
MONO ABS: 0.6 10*3/uL (ref 0.1–1.0)
Monocytes Relative: 5 % (ref 3.0–12.0)
NEUTROS ABS: 9 10*3/uL — AB (ref 1.4–7.7)
NEUTROS PCT: 73.5 % (ref 43.0–77.0)
PLATELETS: 204 10*3/uL (ref 150.0–400.0)
RBC: 5.46 Mil/uL (ref 4.22–5.81)
RDW: 13.3 % (ref 11.5–15.5)
WBC: 12.3 10*3/uL — ABNORMAL HIGH (ref 4.0–10.5)

## 2015-04-14 LAB — HEMOGLOBIN A1C: HEMOGLOBIN A1C: 10.1 % — AB (ref 4.6–6.5)

## 2015-04-14 LAB — GLUCOSE, POCT (MANUAL RESULT ENTRY): POC GLUCOSE: 181 mg/dL — AB (ref 70–99)

## 2015-04-14 LAB — BASIC METABOLIC PANEL
BUN: 10 mg/dL (ref 6–23)
CHLORIDE: 97 meq/L (ref 96–112)
CO2: 32 mEq/L (ref 19–32)
Calcium: 9.9 mg/dL (ref 8.4–10.5)
Creatinine, Ser: 1.01 mg/dL (ref 0.40–1.50)
GFR: 80.43 mL/min (ref >60.00)
Glucose, Bld: 173 mg/dL — ABNORMAL HIGH (ref 70–99)
POTASSIUM: 4.4 meq/L (ref 3.5–5.1)
SODIUM: 138 meq/L (ref 135–145)

## 2015-04-14 LAB — HEPATIC FUNCTION PANEL
ALK PHOS: 73 U/L (ref 39–117)
ALT: 17 U/L (ref 0–53)
AST: 13 U/L (ref 0–37)
Albumin: 4.3 g/dL (ref 3.5–5.2)
BILIRUBIN DIRECT: 0.1 mg/dL (ref 0.0–0.3)
BILIRUBIN TOTAL: 0.5 mg/dL (ref 0.2–1.2)
Total Protein: 6.7 g/dL (ref 6.0–8.3)

## 2015-04-14 LAB — PSA: PSA: 0.46 ng/mL (ref 0.10–4.00)

## 2015-04-14 LAB — TSH: TSH: 1.36 u[IU]/mL (ref 0.35–4.50)

## 2015-04-14 NOTE — Assessment & Plan Note (Addendum)
Dr Chalmers Cater - appt next week Humalog mix, Metformin

## 2015-04-14 NOTE — Assessment & Plan Note (Signed)
1/17 due to hyperglycemia and poss BPH Labs

## 2015-04-14 NOTE — Assessment & Plan Note (Signed)
Wt Readings from Last 3 Encounters:  04/14/15 261 lb (118.389 kg)  12/09/14 261 lb (118.389 kg)  11/11/14 267 lb 5 oz (121.252 kg)

## 2015-04-14 NOTE — Progress Notes (Signed)
Subjective:  Patient ID: Anthony Woods, male    DOB: 1956/03/08  Age: 60 y.o. MRN: [id number]  CC: Annual Exam   HPI Anthony Woods presents for a well exam. C/o L ribs pain x 2 weeks after lifting a cross w/another guy - ?400 lbs. C/o peeing a lot at night  Outpatient Prescriptions Prior to Visit  Medication Sig Dispense Refill  . acetaminophen (TYLENOL) 650 MG CR tablet Take 1,300 mg by mouth every 8 (eight) hours as needed for pain.    Marland Kitchen aspirin 81 MG EC tablet Take 81 mg by mouth daily.      Marland Kitchen atenolol-chlorthalidone (TENORETIC) 100-25 MG tablet TAKE 1 TABLET BY MOUTH DAILY. 30 tablet 5  . diphenhydrAMINE (BENADRYL) 25 MG tablet Take 1 tablet (25 mg total) by mouth every 6 (six) hours. 20 tablet 0  . EPINEPHrine 0.3 mg/0.3 mL IJ SOAJ injection Inject 0.3 mLs (0.3 mg total) into the muscle once. 2 Device 0  . glucagon (GLUCAGON EMERGENCY) 1 MG injection Inject 1 mg into the vein once as needed. 1 each 4  . Insulin Pen Needle (BD ULTRA-FINE PEN NEEDLES) 29G X 12.7MM MISC Use for insulin shots 100 each 11  . levothyroxine (SYNTHROID, LEVOTHROID) 175 MCG tablet TAKE 1 TABLET BY MOUTH DAILY. 90 tablet 3  . linagliptin (TRADJENTA) 5 MG TABS tablet Take 1 tablet (5 mg total) by mouth daily. 30 tablet 5  . losartan (COZAAR) 100 MG tablet TAKE 1 TABLET BY MOUTH DAILY. 90 tablet 3  . metFORMIN (GLUCOPHAGE) 1000 MG tablet Take 1 tablet (1,000 mg total) by mouth 2 (two) times daily with a meal. 180 tablet 3  . potassium chloride (K-DUR) 10 MEQ tablet TAKE 1 TABLET BY MOUTH 2 TIMES DAILY. 60 tablet 11  . predniSONE (DELTASONE) 10 MG tablet Take 4 tablets (40 mg total) by mouth daily. Prn bee sting 20 tablet 3  . simvastatin (ZOCOR) 40 MG tablet TAKE 1 TABLET BY MOUTH DAILY. 30 tablet 11  . ULTICARE MICRO PEN NEEDLES 32G X 4 MM MISC USE AS DIRECTED WITH INSULIN PEN 100 each PRN  . insulin lispro protamine-lispro (HUMALOG 75/25 MIX) (75-25) 100 UNIT/ML SUSP injection Inject 20-40 Units into the  skin 2 (two) times daily with a meal. Takes 40 units in am and 20 units in pm     No facility-administered medications prior to visit.    ROS Review of Systems  Constitutional: Negative for appetite change, fatigue and unexpected weight change.  HENT: Negative for congestion, nosebleeds, sneezing, sore throat and trouble swallowing.   Eyes: Negative for itching and visual disturbance.  Respiratory: Positive for chest tightness. Negative for cough.   Cardiovascular: Negative for chest pain, palpitations and leg swelling.  Gastrointestinal: Negative for nausea, diarrhea, blood in stool and abdominal distention.  Genitourinary: Negative for frequency and hematuria.  Musculoskeletal: Positive for back pain and arthralgias. Negative for joint swelling, gait problem and neck pain.  Skin: Negative for rash.  Neurological: Negative for dizziness, tremors, speech difficulty and weakness.  Psychiatric/Behavioral: Negative for suicidal ideas, sleep disturbance, dysphoric mood and agitation. The patient is not nervous/anxious.   knees hurt  Objective:  BP 138/80 mmHg  Pulse 66  Ht 5\' 6"  (1.676 m)  Wt 261 lb (118.389 kg)  BMI 42.15 kg/m2  SpO2 93%  BP Readings from Last 3 Encounters:  04/14/15 138/80  12/09/14 138/80  11/11/14 119/74    Wt Readings from Last 3 Encounters:  04/14/15 261 lb (118.389 kg)  12/09/14 261 lb (118.389 kg)  11/11/14 267 lb 5 oz (121.252 kg)    Physical Exam  Constitutional: He is oriented to person, place, and time. He appears well-developed. No distress.  NAD  HENT:  Mouth/Throat: Oropharynx is clear and moist.  Eyes: Conjunctivae are normal. Pupils are equal, round, and reactive to light.  Neck: Normal range of motion. No JVD present. No thyromegaly present.  Cardiovascular: Normal rate, regular rhythm, normal heart sounds and intact distal pulses.  Exam reveals no gallop and no friction rub.   No murmur heard. Pulmonary/Chest: Effort normal and breath  sounds normal. No respiratory distress. He has no wheezes. He has no rales. He exhibits no tenderness.  Abdominal: Soft. Bowel sounds are normal. He exhibits no distension and no mass. There is no tenderness. There is no rebound and no guarding.  Musculoskeletal: Normal range of motion. He exhibits tenderness. He exhibits no edema.  Lymphadenopathy:    He has no cervical adenopathy.  Neurological: He is alert and oriented to person, place, and time. He has normal reflexes. No cranial nerve deficit. He exhibits normal muscle tone. He displays a negative Romberg sign. Coordination and gait normal.  Skin: Skin is warm and dry. No rash noted.  Psychiatric: He has a normal mood and affect. His behavior is normal. Judgment and thought content normal.  Obese L anterior ribs are tender Pt declined rectal  EKG - no acute changes  Lab Results  Component Value Date   WBC 9.1 02/01/2012   HGB 15.6 02/01/2012   HCT 47.0 02/01/2012   PLT 175.0 02/01/2012   GLUCOSE 240* 12/09/2014   CHOL 153 12/09/2014   TRIG 246.0* 12/09/2014   HDL 33.70* 12/09/2014   LDLDIRECT 100.0 12/09/2014   LDLCALC 114* 01/06/2012   ALT 14 12/09/2014   AST 14 12/09/2014   NA 136 12/09/2014   K 3.9 12/09/2014   CL 99 12/09/2014   CREATININE 0.97 12/09/2014   BUN 11 12/09/2014   CO2 30 12/09/2014   TSH 1.08 12/09/2014   PSA 0.30 06/08/2011   INR 1.0 02/01/2012   HGBA1C 9.6* 12/09/2014   MICROALBUR 4.7* 12/06/2013    No results found.  Assessment & Plan:   There are no diagnoses linked to this encounter. I have discontinued Mr. Anthony Woods's insulin lispro protamine-lispro. I am also having him maintain his aspirin, glucagon, Insulin Pen Needle, metFORMIN, linagliptin, levothyroxine, losartan, acetaminophen, diphenhydrAMINE, EPINEPHrine, predniSONE, atenolol-chlorthalidone, potassium chloride, simvastatin, ULTICARE MICRO PEN NEEDLES, and HUMALOG MIX 75/25 KWIKPEN.  Meds ordered this encounter  Medications  .  HUMALOG MIX 75/25 KWIKPEN (75-25) 100 UNIT/ML Kwikpen    Sig: Inject 30-40 Units into the skin 2 (two) times daily with a meal. Injects 40 units daily in morning and 30 units daily in evening.    Refill:  4     Follow-up: No Follow-up on file.  Walker Kehr, MD

## 2015-04-14 NOTE — Assessment & Plan Note (Addendum)
F/u w/Dr Julianne Handler On ASA, Tenormin, Simvastatin

## 2015-04-14 NOTE — Assessment & Plan Note (Signed)
1/17 - down to 1.5 ppd Discussed

## 2015-04-14 NOTE — Assessment & Plan Note (Signed)
We discussed age appropriate health related issues, including available/recomended screening tests and vaccinations. We discussed a need for adhering to healthy diet and exercise. Labs/EKG were reviewed/ordered. All questions were answered.   

## 2015-04-14 NOTE — Progress Notes (Signed)
Pre visit review using our clinic review tool, if applicable. No additional management support is needed unless otherwise documented below in the visit note. 

## 2015-04-14 NOTE — Assessment & Plan Note (Signed)
1/17 L MSK

## 2015-04-15 LAB — HEPATITIS C ANTIBODY: HCV Ab: NEGATIVE

## 2015-04-15 NOTE — Addendum Note (Signed)
Addended by: Cassandria Anger on: 04/15/2015 10:21 AM   Modules accepted: Miquel Dunn

## 2015-04-17 ENCOUNTER — Telehealth: Payer: Self-pay | Admitting: Internal Medicine

## 2015-04-17 NOTE — Telephone Encounter (Signed)
New Message  This message is to inform you that we have made 3 consecutive attempts to contact the patient.  We have also mailed a letter to the patient to inform them to call in and schedule. Although we were unsuccessful in these attempts we wanted you to be aware of our efforts. Will remove the patient from our Work queue at this time    Jarold Motto [facility]

## 2015-04-17 NOTE — Telephone Encounter (Signed)
Noted. Thx.

## 2015-04-24 LAB — HM DIABETES EYE EXAM

## 2015-06-21 ENCOUNTER — Other Ambulatory Visit: Payer: Self-pay | Admitting: Internal Medicine

## 2015-06-21 ENCOUNTER — Other Ambulatory Visit: Payer: Self-pay | Admitting: Family Medicine

## 2015-06-27 ENCOUNTER — Encounter (INDEPENDENT_AMBULATORY_CARE_PROVIDER_SITE_OTHER): Payer: Commercial Managed Care - HMO | Admitting: Ophthalmology

## 2015-06-27 DIAGNOSIS — H43813 Vitreous degeneration, bilateral: Secondary | ICD-10-CM

## 2015-06-27 DIAGNOSIS — H2513 Age-related nuclear cataract, bilateral: Secondary | ICD-10-CM | POA: Diagnosis not present

## 2015-06-27 DIAGNOSIS — I10 Essential (primary) hypertension: Secondary | ICD-10-CM

## 2015-06-27 DIAGNOSIS — E113293 Type 2 diabetes mellitus with mild nonproliferative diabetic retinopathy without macular edema, bilateral: Secondary | ICD-10-CM

## 2015-06-27 DIAGNOSIS — H35712 Central serous chorioretinopathy, left eye: Secondary | ICD-10-CM | POA: Diagnosis not present

## 2015-06-27 DIAGNOSIS — H35033 Hypertensive retinopathy, bilateral: Secondary | ICD-10-CM

## 2015-06-27 DIAGNOSIS — E11319 Type 2 diabetes mellitus with unspecified diabetic retinopathy without macular edema: Secondary | ICD-10-CM | POA: Diagnosis not present

## 2015-07-11 ENCOUNTER — Other Ambulatory Visit: Payer: Self-pay | Admitting: Internal Medicine

## 2015-08-12 ENCOUNTER — Other Ambulatory Visit (INDEPENDENT_AMBULATORY_CARE_PROVIDER_SITE_OTHER): Payer: Commercial Managed Care - HMO

## 2015-08-12 ENCOUNTER — Ambulatory Visit (INDEPENDENT_AMBULATORY_CARE_PROVIDER_SITE_OTHER): Payer: Commercial Managed Care - HMO | Admitting: Internal Medicine

## 2015-08-12 ENCOUNTER — Encounter: Payer: Self-pay | Admitting: Internal Medicine

## 2015-08-12 VITALS — BP 130/84 | HR 62 | Wt 260.0 lb

## 2015-08-12 DIAGNOSIS — I10 Essential (primary) hypertension: Secondary | ICD-10-CM | POA: Diagnosis not present

## 2015-08-12 DIAGNOSIS — IMO0001 Reserved for inherently not codable concepts without codable children: Secondary | ICD-10-CM

## 2015-08-12 DIAGNOSIS — E119 Type 2 diabetes mellitus without complications: Secondary | ICD-10-CM

## 2015-08-12 DIAGNOSIS — E034 Atrophy of thyroid (acquired): Secondary | ICD-10-CM

## 2015-08-12 DIAGNOSIS — E038 Other specified hypothyroidism: Secondary | ICD-10-CM | POA: Diagnosis not present

## 2015-08-12 DIAGNOSIS — E1165 Type 2 diabetes mellitus with hyperglycemia: Secondary | ICD-10-CM | POA: Diagnosis not present

## 2015-08-12 DIAGNOSIS — I2510 Atherosclerotic heart disease of native coronary artery without angina pectoris: Secondary | ICD-10-CM

## 2015-08-12 DIAGNOSIS — F17200 Nicotine dependence, unspecified, uncomplicated: Secondary | ICD-10-CM

## 2015-08-12 DIAGNOSIS — Z794 Long term (current) use of insulin: Secondary | ICD-10-CM

## 2015-08-12 DIAGNOSIS — J309 Allergic rhinitis, unspecified: Secondary | ICD-10-CM | POA: Insufficient documentation

## 2015-08-12 DIAGNOSIS — J3089 Other allergic rhinitis: Secondary | ICD-10-CM

## 2015-08-12 LAB — BASIC METABOLIC PANEL
BUN: 7 mg/dL (ref 6–23)
CO2: 31 mEq/L (ref 19–32)
Calcium: 10.6 mg/dL — ABNORMAL HIGH (ref 8.4–10.5)
Chloride: 103 mEq/L (ref 96–112)
Creatinine, Ser: 0.9 mg/dL (ref 0.40–1.50)
GFR: 91.77 mL/min (ref >60.00)
Glucose, Bld: 122 mg/dL — ABNORMAL HIGH (ref 70–99)
Potassium: 4.5 mEq/L (ref 3.5–5.1)
Sodium: 140 mEq/L (ref 135–145)

## 2015-08-12 LAB — HEMOGLOBIN A1C: Hgb A1c MFr Bld: 8.4 % — ABNORMAL HIGH (ref 4.6–6.5)

## 2015-08-12 MED ORDER — LINAGLIPTIN 5 MG PO TABS: 5.0000 mg | ORAL_TABLET | Freq: Every day | ORAL | Status: DC

## 2015-08-12 MED ORDER — POTASSIUM CHLORIDE ER 10 MEQ PO TBCR: 10.0000 meq | EXTENDED_RELEASE_TABLET | Freq: Two times a day (BID) | ORAL | Status: DC

## 2015-08-12 MED ORDER — HUMALOG MIX 75/25 KWIKPEN (75-25) 100 UNIT/ML ~~LOC~~ SUPN: 30.0000 [IU] | PEN_INJECTOR | Freq: Two times a day (BID) | SUBCUTANEOUS | Status: DC

## 2015-08-12 MED ORDER — LORATADINE 10 MG PO TABS: 10.0000 mg | ORAL_TABLET | Freq: Every day | ORAL | Status: DC

## 2015-08-12 MED ORDER — LEVOTHYROXINE SODIUM 175 MCG PO TABS: 175.0000 ug | ORAL_TABLET | Freq: Every day | ORAL | Status: DC

## 2015-08-12 MED ORDER — METFORMIN HCL 1000 MG PO TABS
1000.0000 mg | ORAL_TABLET | Freq: Two times a day (BID) | ORAL | Status: DC
Start: 2015-08-12 — End: 2022-09-03

## 2015-08-12 NOTE — Assessment & Plan Note (Signed)
On ASA, Tenormin, Simvastatin

## 2015-08-12 NOTE — Progress Notes (Signed)
Subjective:  Patient ID: Anthony Woods, male    DOB: 14-Oct-1955  Age: 60 y.o. MRN: [id number]  CC: No chief complaint on file.   HPI Anthony Woods presents for DM2, CAD, HTN, COPD f/u. C/o allergies... He saw Dr Chalmers Cater  Outpatient Prescriptions Prior to Visit  Medication Sig Dispense Refill  . acetaminophen (TYLENOL) 650 MG CR tablet Take 1,300 mg by mouth every 8 (eight) hours as needed for pain.    Marland Kitchen aspirin 81 MG EC tablet Take 81 mg by mouth daily.      Marland Kitchen atenolol-chlorthalidone (TENORETIC) 100-25 MG tablet Take 1 tablet by mouth daily. 90 tablet 3  . diphenhydrAMINE (BENADRYL) 25 MG tablet Take 1 tablet (25 mg total) by mouth every 6 (six) hours. 20 tablet 0  . EPINEPHrine 0.3 mg/0.3 mL IJ SOAJ injection Inject 0.3 mLs (0.3 mg total) into the muscle once. 2 Device 0  . glucagon (GLUCAGON EMERGENCY) 1 MG injection Inject 1 mg into the vein once as needed. 1 each 4  . HUMALOG MIX 75/25 KWIKPEN (75-25) 100 UNIT/ML Kwikpen Inject 30-40 Units into the skin 2 (two) times daily with a meal. Injects 40 units daily in morning and 30 units daily in evening.  4  . Insulin Pen Needle (BD ULTRA-FINE PEN NEEDLES) 29G X 12.7MM MISC Use for insulin shots 100 each 11  . levothyroxine (SYNTHROID, LEVOTHROID) 175 MCG tablet TAKE 1 TABLET BY MOUTH DAILY. 90 tablet 3  . linagliptin (TRADJENTA) 5 MG TABS tablet Take 1 tablet (5 mg total) by mouth daily. 30 tablet 5  . losartan (COZAAR) 100 MG tablet TAKE 1 TABLET BY MOUTH DAILY. 90 tablet 3  . metFORMIN (GLUCOPHAGE) 1000 MG tablet Take 1 tablet (1,000 mg total) by mouth 2 (two) times daily with a meal. 180 tablet 3  . potassium chloride (K-DUR) 10 MEQ tablet TAKE 1 TABLET BY MOUTH 2 TIMES DAILY. 60 tablet 11  . predniSONE (DELTASONE) 10 MG tablet Take 4 tablets (40 mg total) by mouth daily. Prn bee sting 20 tablet 3  . simvastatin (ZOCOR) 40 MG tablet TAKE 1 TABLET BY MOUTH DAILY. 30 tablet 11  . ULTICARE MICRO PEN NEEDLES 32G X 4 MM MISC USE AS DIRECTED  WITH INSULIN PEN 100 each PRN  . atenolol-chlorthalidone (TENORETIC) 100-25 MG tablet TAKE 1 TABLET BY MOUTH DAILY. (Patient not taking: Reported on 08/12/2015) 30 tablet 5   No facility-administered medications prior to visit.    ROS Review of Systems  Constitutional: Negative for appetite change, fatigue and unexpected weight change.  HENT: Positive for rhinorrhea. Negative for congestion, nosebleeds, sneezing, sore throat and trouble swallowing.   Eyes: Negative for itching and visual disturbance.  Respiratory: Positive for cough.   Cardiovascular: Negative for chest pain, palpitations and leg swelling.  Gastrointestinal: Negative for nausea, diarrhea, blood in stool and abdominal distention.  Genitourinary: Negative for frequency and hematuria.  Musculoskeletal: Negative for back pain, joint swelling, gait problem and neck pain.  Skin: Negative for rash.  Neurological: Negative for dizziness, tremors, speech difficulty and weakness.  Psychiatric/Behavioral: Negative for suicidal ideas, sleep disturbance, dysphoric mood and agitation. The patient is not nervous/anxious.     Objective:  BP 130/84 mmHg  Pulse 62  Wt 260 lb (117.935 kg)  SpO2 96%  BP Readings from Last 3 Encounters:  08/12/15 130/84  04/14/15 138/80  12/09/14 138/80    Wt Readings from Last 3 Encounters:  08/12/15 260 lb (117.935 kg)  04/14/15 261 lb (118.389 kg)  12/09/14 261 lb (118.389 kg)    Physical Exam  Constitutional: He is oriented to person, place, and time. He appears well-developed. No distress.  NAD  HENT:  Mouth/Throat: Oropharynx is clear and moist.  Eyes: Conjunctivae are normal. Pupils are equal, round, and reactive to light.  Neck: Normal range of motion. No JVD present. No thyromegaly present.  Cardiovascular: Normal rate, regular rhythm, normal heart sounds and intact distal pulses.  Exam reveals no gallop and no friction rub.   No murmur heard. Pulmonary/Chest: Effort normal and  breath sounds normal. No respiratory distress. He has no wheezes. He has no rales. He exhibits no tenderness.  Abdominal: Soft. Bowel sounds are normal. He exhibits no distension and no mass. There is no tenderness. There is no rebound and no guarding.  Musculoskeletal: Normal range of motion. He exhibits no edema or tenderness.  Lymphadenopathy:    He has no cervical adenopathy.  Neurological: He is alert and oriented to person, place, and time. He has normal reflexes. No cranial nerve deficit. He exhibits normal muscle tone. He displays a negative Romberg sign. Coordination and gait normal.  Skin: Skin is warm and dry. No rash noted.  Psychiatric: He has a normal mood and affect. His behavior is normal. Judgment and thought content normal.  Obese  Lab Results  Component Value Date   WBC 12.3* 04/14/2015   HGB 16.9 04/14/2015   HCT 50.8 04/14/2015   PLT 204.0 04/14/2015   GLUCOSE 173* 04/14/2015   CHOL 170 04/14/2015   TRIG 163.0* 04/14/2015   HDL 37.60* 04/14/2015   LDLDIRECT 100.0 12/09/2014   LDLCALC 100* 04/14/2015   ALT 17 04/14/2015   AST 13 04/14/2015   NA 138 04/14/2015   K 4.4 04/14/2015   CL 97 04/14/2015   CREATININE 1.01 04/14/2015   BUN 10 04/14/2015   CO2 32 04/14/2015   TSH 1.36 04/14/2015   PSA 0.46 04/14/2015   INR 1.0 02/01/2012   HGBA1C 10.1* 04/14/2015   MICROALBUR 4.7* 12/06/2013    No results found.  Assessment & Plan:   Diagnoses and all orders for this visit:  Uncontrolled type 2 diabetes mellitus without complication, with long-term current use of insulin (HCC) -     linagliptin (TRADJENTA) 5 MG TABS tablet; Take 1 tablet (5 mg total) by mouth daily. -     metFORMIN (GLUCOPHAGE) 1000 MG tablet; Take 1 tablet (1,000 mg total) by mouth 2 (two) times daily with a meal.  Other orders -     HUMALOG MIX 75/25 KWIKPEN (75-25) 100 UNIT/ML Kwikpen; Inject 30-40 Units into the skin 2 (two) times daily with a meal. Injects 40 units daily in morning and  30 units daily in evening. -     levothyroxine (SYNTHROID, LEVOTHROID) 175 MCG tablet; Take 1 tablet (175 mcg total) by mouth daily. -     potassium chloride (K-DUR) 10 MEQ tablet; Take 1 tablet (10 mEq total) by mouth 2 (two) times daily.   I am having Mr. Dejoseph maintain his aspirin, glucagon, Insulin Pen Needle, metFORMIN, linagliptin, levothyroxine, losartan, acetaminophen, diphenhydrAMINE, EPINEPHrine, predniSONE, potassium chloride, simvastatin, ULTICARE MICRO PEN NEEDLES, HUMALOG MIX 75/25 KWIKPEN, atenolol-chlorthalidone, ONE TOUCH ULTRA TEST, and onetouch ultrasoft.  Meds ordered this encounter  Medications  . ONE TOUCH ULTRA TEST test strip    Sig: 1 strip by Does not apply route 2 (two) times daily.    Refill:  12  . Lancets (ONETOUCH ULTRASOFT) lancets    Sig: 1 each by Other  route 2 (two) times daily.    Refill:  12     Follow-up: No Follow-up on file.  Walker Kehr, MD

## 2015-08-12 NOTE — Assessment & Plan Note (Signed)
Loratidine

## 2015-08-12 NOTE — Progress Notes (Signed)
Pre visit review using our clinic review tool, if applicable. No additional management support is needed unless otherwise documented below in the visit note. 

## 2015-08-12 NOTE — Assessment & Plan Note (Signed)
Losartan, Tenoretic 

## 2015-08-12 NOTE — Assessment & Plan Note (Signed)
Discussed.

## 2015-08-12 NOTE — Assessment & Plan Note (Signed)
On Levothroid 

## 2015-08-12 NOTE — Assessment & Plan Note (Signed)
Insulin pen, Metformin, Trajenta

## 2015-09-18 ENCOUNTER — Other Ambulatory Visit: Payer: Self-pay | Admitting: Internal Medicine

## 2015-10-06 ENCOUNTER — Encounter (HOSPITAL_COMMUNITY): Payer: Self-pay | Admitting: Emergency Medicine

## 2015-10-06 ENCOUNTER — Emergency Department (HOSPITAL_COMMUNITY)
Admission: EM | Admit: 2015-10-06 | Discharge: 2015-10-06 | Disposition: A | Discharge status: Home / Self Care | Payer: Worker's Compensation | Attending: Emergency Medicine | Admitting: Emergency Medicine

## 2015-10-06 DIAGNOSIS — Z7984 Long term (current) use of oral hypoglycemic drugs: Secondary | ICD-10-CM | POA: Diagnosis not present

## 2015-10-06 DIAGNOSIS — I10 Essential (primary) hypertension: Secondary | ICD-10-CM | POA: Insufficient documentation

## 2015-10-06 DIAGNOSIS — E119 Type 2 diabetes mellitus without complications: Secondary | ICD-10-CM | POA: Insufficient documentation

## 2015-10-06 DIAGNOSIS — Z794 Long term (current) use of insulin: Secondary | ICD-10-CM | POA: Insufficient documentation

## 2015-10-06 DIAGNOSIS — Z79899 Other long term (current) drug therapy: Secondary | ICD-10-CM | POA: Insufficient documentation

## 2015-10-06 DIAGNOSIS — X58XXXA Exposure to other specified factors, initial encounter: Secondary | ICD-10-CM | POA: Diagnosis not present

## 2015-10-06 DIAGNOSIS — E785 Hyperlipidemia, unspecified: Secondary | ICD-10-CM | POA: Diagnosis not present

## 2015-10-06 DIAGNOSIS — S0086XA Insect bite (nonvenomous) of other part of head, initial encounter: Secondary | ICD-10-CM | POA: Diagnosis present

## 2015-10-06 DIAGNOSIS — Y999 Unspecified external cause status: Secondary | ICD-10-CM | POA: Insufficient documentation

## 2015-10-06 DIAGNOSIS — Z7982 Long term (current) use of aspirin: Secondary | ICD-10-CM | POA: Diagnosis not present

## 2015-10-06 DIAGNOSIS — F17210 Nicotine dependence, cigarettes, uncomplicated: Secondary | ICD-10-CM | POA: Insufficient documentation

## 2015-10-06 DIAGNOSIS — Y929 Unspecified place or not applicable: Secondary | ICD-10-CM | POA: Insufficient documentation

## 2015-10-06 DIAGNOSIS — Z6841 Body Mass Index (BMI) 40.0 and over, adult: Secondary | ICD-10-CM | POA: Insufficient documentation

## 2015-10-06 DIAGNOSIS — T63441A Toxic effect of venom of bees, accidental (unintentional), initial encounter: Secondary | ICD-10-CM

## 2015-10-06 DIAGNOSIS — Y939 Activity, unspecified: Secondary | ICD-10-CM | POA: Diagnosis not present

## 2015-10-06 DIAGNOSIS — E669 Obesity, unspecified: Secondary | ICD-10-CM | POA: Insufficient documentation

## 2015-10-06 MED ORDER — PREDNISONE 20 MG PO TABS: 60.0000 mg | ORAL_TABLET | Freq: Every day | ORAL | Status: DC

## 2015-10-06 MED ORDER — EPINEPHRINE 0.3 MG/0.3ML IJ SOAJ: 0.3000 mg | Freq: Once | INTRAMUSCULAR | Status: DC

## 2015-10-06 MED ORDER — DIPHENHYDRAMINE HCL 25 MG PO CAPS
25.0000 mg | ORAL_CAPSULE | Freq: Once | ORAL | Status: AC
  Administered 2015-10-06: 25 mg via ORAL
  Filled 2015-10-06: qty 1

## 2015-10-06 MED ORDER — PREDNISONE 20 MG PO TABS
60.0000 mg | ORAL_TABLET | Freq: Once | ORAL | Status: AC
  Administered 2015-10-06: 60 mg via ORAL
  Filled 2015-10-06: qty 3

## 2015-10-06 NOTE — ED Notes (Signed)
Patient given coke. 

## 2015-10-06 NOTE — ED Notes (Signed)
Pt was stung by a bee at 1300 on the back of his head. Pt has swelling to back of head that his coming around to right side of neck. Pt used his epi pen at 1305. Pts speech is clear. Denies and sob or difficulty breathing at this time.

## 2015-10-06 NOTE — ED Notes (Signed)
Pt refused gown.  

## 2015-10-06 NOTE — ED Notes (Signed)
Pt wife at bedside.

## 2015-10-06 NOTE — ED Notes (Signed)
Patient refused gown. 

## 2015-10-06 NOTE — ED Notes (Signed)
Patient has some swelling to the back of the neck. Patient states he has intermitted itching, redness noted. Patient states painful. Patient states he took his EpiPen.

## 2015-10-06 NOTE — ED Provider Notes (Signed)
CSN: [id number]     Arrival date & time 10/06/15  1326 History   First MD Initiated Contact with Patient 10/06/15 1536     Chief Complaint  Patient presents with  . 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 He carries an EpiPen with him, when he became symptomatic after being stung by a bee at 1:00 PM he gave himself an EpiPen. Since that time he has improved significantly. His initial symptoms were pain and swelling to the back of his head at the neck, some swelling towards the right side of the cheek and the neck and some itching on the palms. Since giving himself the EpiPen his symptoms have improved but are still persistent. There has been no respiratory symptoms including no swelling of the lips, the tongue, the pharynx, no hypotension, near-syncope or palpitations and no chest pain. He has had use an EpiPen for this multiple times in the past.  Past Medical History  Diagnosis Date  . CAD (coronary artery disease)   . Type II or unspecified type diabetes mellitus without mention of complication, not stated as uncontrolled   . HTN (hypertension)   . Hypothyroidism   . Allergy to bee sting   . Macular degeneration   . Hyperlipidemia   . Obesity   . Arthritis    Past Surgical History  Procedure Laterality Date  . Knee surgery      Meniscus removal  . Wrist ganglion excision      left  . Left heart catheterization with coronary angiogram N/A 02/04/2012    Procedure: LEFT HEART CATHETERIZATION WITH CORONARY ANGIOGRAM;  Surgeon: Burnell Blanks, MD;  Location: [facility] CATH LAB;  Service: Cardiovascular;  Laterality: N/A;   Family History  Problem Relation Age of Onset  . Hypertension Other   . Diabetes Father   . Cancer Father     stomach  . CVA Father   . Stomach cancer  Father   . Diabetes Mother   . Cancer Mother     male ca  . Colon cancer Mother 4  . Diabetes Other   . CAD Maternal Grandmother    Social History  Substance Use Topics  . Smoking status: Current Every Day Smoker -- 2.00 packs/day for 40 years    Types: Cigarettes  . Smokeless tobacco: Never Used  . Alcohol Use: No    Review of Systems  All other systems reviewed and are negative.     Allergies  Bee venom; Actos; Lipitor; and Lisinopril  Home Medications   Prior to Admission medications   Medication Sig Start Date End Date Taking? Authorizing Provider  acetaminophen (TYLENOL) 650 MG CR tablet Take 650 mg by mouth every 12 (twelve) hours.    Yes Historical Provider, MD  aspirin 81 MG EC tablet Take 81 mg by mouth daily.     Yes Historical Provider, MD  atenolol-chlorthalidone (TENORETIC) 100-25 MG tablet Take 1 tablet by mouth daily. 07/14/15  Yes Evie Lacks Plotnikov, MD  Lancets (ONETOUCH ULTRASOFT) lancets 1 each by Other route 2 (two) times daily. 07/31/15  Yes Historical Provider, MD  ONE TOUCH ULTRA TEST test strip 1 strip by Does not apply route 2 (two) times daily. 07/31/15  Yes Historical Provider, MD  simvastatin (ZOCOR) 40 MG tablet TAKE 1 TABLET BY MOUTH DAILY. 02/12/15  Yes  Cassandria Anger, MD  ULTICARE MICRO PEN NEEDLES 32G X 4 MM MISC USE AS DIRECTED WITH INSULIN PEN 03/06/15  Yes Renato Shin, MD  diphenhydrAMINE (BENADRYL) 25 MG tablet Take 1 tablet (25 mg total) by mouth every 6 (six) hours. Patient taking differently: Take 25 mg by mouth every 6 (six) hours as needed for allergies. For allergic reactions 11/11/14   Fredia Sorrow, MD  EPINEPHrine 0.3 mg/0.3 mL IJ SOAJ injection Inject 0.3 mLs (0.3 mg total) into the muscle once. 11/11/14   Fredia Sorrow, MD  glucagon (GLUCAGON EMERGENCY) 1 MG injection Inject 1 mg into the vein once as needed. 07/13/12   Renato Shin, MD  HUMALOG MIX 75/25 KWIKPEN (75-25) 100 UNIT/ML Kwikpen Inject 30-40 Units into the  skin 2 (two) times daily with a meal. Injects 40 units daily in morning and 30 units daily in evening. 08/12/15   Cassandria Anger, MD  Insulin Pen Needle (BD ULTRA-FINE PEN NEEDLES) 29G X 12.7MM MISC Use for insulin shots 01/29/14   Cassandria Anger, MD  levothyroxine (SYNTHROID, LEVOTHROID) 175 MCG tablet Take 1 tablet (175 mcg total) by mouth daily. 08/12/15   Evie Lacks Plotnikov, MD  linagliptin (TRADJENTA) 5 MG TABS tablet Take 1 tablet (5 mg total) by mouth daily. 08/12/15   Evie Lacks Plotnikov, MD  loratadine (CLARITIN) 10 MG tablet Take 1 tablet (10 mg total) by mouth daily. 08/12/15   Evie Lacks Plotnikov, MD  losartan (COZAAR) 100 MG tablet TAKE 1 TABLET BY MOUTH DAILY. 09/18/15   Evie Lacks Plotnikov, MD  metFORMIN (GLUCOPHAGE) 1000 MG tablet Take 1 tablet (1,000 mg total) by mouth 2 (two) times daily with a meal. 08/12/15   Evie Lacks Plotnikov, MD  potassium chloride (K-DUR) 10 MEQ tablet Take 1 tablet (10 mEq total) by mouth 2 (two) times daily. 08/12/15   Evie Lacks Plotnikov, MD  predniSONE (DELTASONE) 10 MG tablet Take 4 tablets (40 mg total) by mouth daily. Prn bee sting 12/12/14   Evie Lacks Plotnikov, MD   BP 156/78 mmHg  Pulse 67  Temp(Src) 98.8 F (37.1 C) (Oral)  Resp 20  Ht 5\' 5"  (1.651 m)  Wt 260 lb (117.935 kg)  BMI 43.27 kg/m2  SpO2 95% Physical Exam  Constitutional: He appears well-developed and well-nourished. No distress.  HENT:  Head: Normocephalic and atraumatic.  Mouth/Throat: Oropharynx is clear and moist. No oropharyngeal exudate.  Bee sting evident to the posterior scalp at the neck scalp junction, some surrounding induration, no swelling of the face the cheeks the lips or the tongue, oropharynx is clear, no asymmetry of the tonsillar pillars, uvula is midline and phonation is normal  Eyes: Conjunctivae and EOM are normal. Pupils are equal, round, and reactive to light. Right eye exhibits no discharge. Left eye exhibits no discharge. No scleral icterus.  Neck:  Normal range of motion. Neck supple. No JVD present. No thyromegaly present.  Cardiovascular: Normal rate, regular rhythm, normal heart sounds and intact distal pulses.  Exam reveals no gallop and no friction rub.   No murmur heard. Pulmonary/Chest: Effort normal and breath sounds normal. No respiratory distress. He has no wheezes. He has no rales.  Normal work of breathing, no wheezing  Abdominal: Soft. Bowel sounds are normal. He exhibits no distension and no mass. There is no tenderness.  Musculoskeletal: Normal range of motion. He exhibits no edema or tenderness.  Lymphadenopathy:    He has no cervical adenopathy.  Neurological: He is alert. Coordination normal.  Skin: Skin  is warm and dry. No rash noted. No erythema.  Psychiatric: He has a normal mood and affect. His behavior is normal.  Nursing note and vitals reviewed.   ED Course  Procedures (including critical care time) Labs Review Labs Reviewed - No data to display  Imaging Review No results found. I have personally reviewed and evaluated these images and lab results as part of my medical decision-making.    MDM   Final diagnoses:  Allergic reaction to bee sting, accidental or unintentional, initial encounter    The patient appears well, he has no urticaria, his vital signs are unremarkable except for mild hypertension. There has been no signs of anaphylaxis. He will be given a dose of steroids and Benadryl and observed for several hours. The patient is in agreement with the plan.  The pt has improved - no recurrent sx - VS normal - stable and walking around ED without difficulty - pt aware of indications for return.  Meds given in ED:  Medications  predniSONE (DELTASONE) tablet 60 mg (60 mg Oral Given 10/06/15 1630)  diphenhydrAMINE (BENADRYL) capsule 25 mg (25 mg Oral Given 10/06/15 1630)    New Prescriptions   PREDNISONE (DELTASONE) 20 MG TABLET    Take 3 tablets (60 mg total) by mouth daily.      Noemi Chapel, MD 10/06/15 [phone]

## 2015-10-06 NOTE — Discharge Instructions (Signed)

## 2015-12-15 ENCOUNTER — Ambulatory Visit (INDEPENDENT_AMBULATORY_CARE_PROVIDER_SITE_OTHER): Payer: Commercial Managed Care - HMO | Admitting: Internal Medicine

## 2015-12-15 ENCOUNTER — Encounter: Payer: Self-pay | Admitting: Internal Medicine

## 2015-12-15 VITALS — BP 128/80 | HR 56 | Temp 98.4°F | Wt 258.0 lb

## 2015-12-15 DIAGNOSIS — E1159 Type 2 diabetes mellitus with other circulatory complications: Secondary | ICD-10-CM | POA: Diagnosis not present

## 2015-12-15 DIAGNOSIS — Z0000 Encounter for general adult medical examination without abnormal findings: Secondary | ICD-10-CM

## 2015-12-15 DIAGNOSIS — Z23 Encounter for immunization: Secondary | ICD-10-CM | POA: Diagnosis not present

## 2015-12-15 DIAGNOSIS — E1165 Type 2 diabetes mellitus with hyperglycemia: Secondary | ICD-10-CM

## 2015-12-15 DIAGNOSIS — I10 Essential (primary) hypertension: Secondary | ICD-10-CM | POA: Diagnosis not present

## 2015-12-15 DIAGNOSIS — T6391XS Toxic effect of contact with unspecified venomous animal, accidental (unintentional), sequela: Secondary | ICD-10-CM | POA: Diagnosis not present

## 2015-12-15 DIAGNOSIS — I2510 Atherosclerotic heart disease of native coronary artery without angina pectoris: Secondary | ICD-10-CM | POA: Diagnosis not present

## 2015-12-15 DIAGNOSIS — IMO0002 Reserved for concepts with insufficient information to code with codable children: Secondary | ICD-10-CM

## 2015-12-15 NOTE — Progress Notes (Signed)
Subjective:  Patient ID: Anthony Woods, male    DOB: 05-Jun-1955  Age: 60 y.o. MRN: [id number]  CC: No chief complaint on file.   HPI Latray A Manfredo presents for HTN, DM, CAD f/u  Outpatient Medications Prior to Visit  Medication Sig Dispense Refill  . acetaminophen (TYLENOL) 650 MG CR tablet Take 650 mg by mouth every 12 (twelve) hours.     Marland Kitchen aspirin 81 MG EC tablet Take 81 mg by mouth daily.      Marland Kitchen atenolol-chlorthalidone (TENORETIC) 100-25 MG tablet Take 1 tablet by mouth daily. 90 tablet 3  . diphenhydrAMINE (BENADRYL) 25 MG tablet Take 1 tablet (25 mg total) by mouth every 6 (six) hours. (Patient taking differently: Take 25 mg by mouth every 6 (six) hours as needed for allergies. For allergic reactions) 20 tablet 0  . EPINEPHrine 0.3 mg/0.3 mL IJ SOAJ injection Inject 0.3 mLs (0.3 mg total) into the muscle once. For anaphylactic reaction 2 Device 0  . glucagon (GLUCAGON EMERGENCY) 1 MG injection Inject 1 mg into the vein once as needed. 1 each 4  . HUMALOG MIX 75/25 KWIKPEN (75-25) 100 UNIT/ML Kwikpen Inject 30-40 Units into the skin 2 (two) times daily with a meal. Injects 40 units daily in morning and 30 units daily in evening. (Patient taking differently: Inject 20-50 Units into the skin 2 (two) times daily with a meal. 50 units in the morning and 20 units in the evening) 15 mL 3  . ibuprofen (ADVIL,MOTRIN) 200 MG tablet Take 200 mg by mouth 2 (two) times daily.    . Insulin Pen Needle (BD ULTRA-FINE PEN NEEDLES) 29G X 12.7MM MISC Use for insulin shots 100 each 11  . Lancets (ONETOUCH ULTRASOFT) lancets 1 each by Other route 2 (two) times daily.  12  . levothyroxine (SYNTHROID, LEVOTHROID) 175 MCG tablet Take 1 tablet (175 mcg total) by mouth daily. 90 tablet 3  . linagliptin (TRADJENTA) 5 MG TABS tablet Take 1 tablet (5 mg total) by mouth daily. 90 tablet 3  . losartan (COZAAR) 100 MG tablet TAKE 1 TABLET BY MOUTH DAILY. 90 tablet 3  . metFORMIN (GLUCOPHAGE) 1000 MG tablet Take 1  tablet (1,000 mg total) by mouth 2 (two) times daily with a meal. 180 tablet 3  . ONE TOUCH ULTRA TEST test strip 1 strip by Does not apply route 2 (two) times daily.  12  . potassium chloride (K-DUR) 10 MEQ tablet Take 1 tablet (10 mEq total) by mouth 2 (two) times daily. 180 tablet 3  . predniSONE (DELTASONE) 20 MG tablet Take 3 tablets (60 mg total) by mouth daily. 15 tablet 0  . simvastatin (ZOCOR) 40 MG tablet TAKE 1 TABLET BY MOUTH DAILY. 30 tablet 11  . ULTICARE MICRO PEN NEEDLES 32G X 4 MM MISC USE AS DIRECTED WITH INSULIN PEN 100 each PRN  . loratadine (CLARITIN) 10 MG tablet Take 1 tablet (10 mg total) by mouth daily. (Patient not taking: Reported on 12/15/2015) 100 tablet 3   No facility-administered medications prior to visit.     ROS Review of Systems  Constitutional: Negative for appetite change, fatigue and unexpected weight change.  HENT: Negative for congestion, nosebleeds, sneezing, sore throat and trouble swallowing.   Eyes: Negative for itching and visual disturbance.  Respiratory: Negative for cough.   Cardiovascular: Negative for chest pain, palpitations and leg swelling.  Gastrointestinal: Negative for abdominal distention, blood in stool, diarrhea and nausea.  Genitourinary: Negative for frequency and hematuria.  Musculoskeletal:  Negative for back pain, gait problem, joint swelling and neck pain.  Skin: Negative for rash.  Neurological: Negative for dizziness, tremors, speech difficulty and weakness.  Psychiatric/Behavioral: Negative for agitation, dysphoric mood and sleep disturbance. The patient is not nervous/anxious.     Objective:  BP 128/80   Pulse (!) 56   Temp 98.4 F (36.9 C) (Oral)   Wt 258 lb (117 kg)   SpO2 92%   BMI 42.93 kg/m   BP Readings from Last 3 Encounters:  12/15/15 128/80  10/06/15 158/84  08/12/15 130/84    Wt Readings from Last 3 Encounters:  12/15/15 258 lb (117 kg)  10/06/15 260 lb (117.9 kg)  08/12/15 260 lb (117.9 kg)     Physical Exam  Constitutional: He is oriented to person, place, and time. He appears well-developed. No distress.  NAD  HENT:  Mouth/Throat: Oropharynx is clear and moist.  Eyes: Conjunctivae are normal. Pupils are equal, round, and reactive to light.  Neck: Normal range of motion. No JVD present. No thyromegaly present.  Cardiovascular: Normal rate, regular rhythm, normal heart sounds and intact distal pulses.  Exam reveals no gallop and no friction rub.   No murmur heard. Pulmonary/Chest: Effort normal and breath sounds normal. No respiratory distress. He has no wheezes. He has no rales. He exhibits no tenderness.  Abdominal: Soft. Bowel sounds are normal. He exhibits no distension and no mass. There is no tenderness. There is no rebound and no guarding.  Musculoskeletal: Normal range of motion. He exhibits no edema or tenderness.  Lymphadenopathy:    He has no cervical adenopathy.  Neurological: He is alert and oriented to person, place, and time. He has normal reflexes. No cranial nerve deficit. He exhibits normal muscle tone. He displays a negative Romberg sign. Coordination and gait normal.  Skin: Skin is warm and dry. No rash noted.  Psychiatric: He has a normal mood and affect. His behavior is normal. Judgment and thought content normal.    Lab Results  Component Value Date   WBC 12.3 (H) 04/14/2015   HGB 16.9 04/14/2015   HCT 50.8 04/14/2015   PLT 204.0 04/14/2015   GLUCOSE 122 (H) 08/12/2015   CHOL 170 04/14/2015   TRIG 163.0 (H) 04/14/2015   HDL 37.60 (L) 04/14/2015   LDLDIRECT 100.0 12/09/2014   LDLCALC 100 (H) 04/14/2015   ALT 17 04/14/2015   AST 13 04/14/2015   NA 140 08/12/2015   K 4.5 08/12/2015   CL 103 08/12/2015   CREATININE 0.90 08/12/2015   BUN 7 08/12/2015   CO2 31 08/12/2015   TSH 1.36 04/14/2015   PSA 0.46 04/14/2015   INR 1.0 02/01/2012   HGBA1C 8.4 (H) 08/12/2015   MICROALBUR 4.7 (H) 12/06/2013    No results found.  Assessment & Plan:    There are no diagnoses linked to this encounter. I am having Mr. Inglis maintain his aspirin, glucagon, Insulin Pen Needle, acetaminophen, diphenhydrAMINE, simvastatin, ULTICARE MICRO PEN NEEDLES, atenolol-chlorthalidone, ONE TOUCH ULTRA TEST, onetouch ultrasoft, HUMALOG MIX 75/25 KWIKPEN, linagliptin, levothyroxine, metFORMIN, potassium chloride, loratadine, losartan, ibuprofen, EPINEPHrine, and predniSONE.  No orders of the defined types were placed in this encounter.    Follow-up: No Follow-up on file.  Walker Kehr, MD

## 2015-12-15 NOTE — Assessment & Plan Note (Signed)
Severe reaction He has a kit of Rx prn

## 2015-12-15 NOTE — Assessment & Plan Note (Signed)
Losartan, Tenoretic 

## 2015-12-15 NOTE — Addendum Note (Signed)
Addended by: Cresenciano Lick on: 12/15/2015 09:28 AM   Modules accepted: Orders

## 2015-12-15 NOTE — Progress Notes (Signed)
Pre visit review using our clinic review tool, if applicable. No additional management support is needed unless otherwise documented below in the visit note. 

## 2015-12-15 NOTE — Assessment & Plan Note (Signed)
On ASA, Tenormin, Simvastatin

## 2015-12-15 NOTE — Assessment & Plan Note (Addendum)
Humalog mix, Metformin Chronic, not well controlled Dr Chalmers Cater in Nov w/labs

## 2015-12-17 ENCOUNTER — Telehealth: Payer: Self-pay | Admitting: Internal Medicine

## 2015-12-17 DIAGNOSIS — M25569 Pain in unspecified knee: Secondary | ICD-10-CM

## 2015-12-17 NOTE — Telephone Encounter (Signed)
FYI: Patient's wife called to give there surgeron who did his knee surgery. It was Dr. Hart Robinsons, [place] [phone].

## 2015-12-19 NOTE — Telephone Encounter (Signed)
I'll refer to Dr Theda Sers Thx

## 2015-12-19 NOTE — Addendum Note (Signed)
Addended by: Cassandria Anger on: 12/19/2015 10:20 AM   Modules accepted: Orders

## 2016-01-30 ENCOUNTER — Other Ambulatory Visit: Payer: Self-pay | Admitting: Internal Medicine

## 2016-03-09 ENCOUNTER — Other Ambulatory Visit: Payer: Self-pay | Admitting: Internal Medicine

## 2016-03-29 ENCOUNTER — Ambulatory Visit (INDEPENDENT_AMBULATORY_CARE_PROVIDER_SITE_OTHER): Payer: Commercial Managed Care - HMO | Admitting: Ophthalmology

## 2016-04-13 ENCOUNTER — Ambulatory Visit (INDEPENDENT_AMBULATORY_CARE_PROVIDER_SITE_OTHER): Payer: Commercial Managed Care - HMO | Admitting: Physician Assistant

## 2016-04-13 ENCOUNTER — Encounter: Payer: Self-pay | Admitting: Physician Assistant

## 2016-04-13 DIAGNOSIS — M25521 Pain in right elbow: Secondary | ICD-10-CM

## 2016-04-13 MED ORDER — DICLOFENAC SODIUM 75 MG PO TBEC: 75.0000 mg | DELAYED_RELEASE_TABLET | Freq: Two times a day (BID) | ORAL | 0 refills | Status: DC

## 2016-04-13 MED ORDER — DICLOFENAC SODIUM 1 % TD GEL
1.0000 | Freq: Four times a day (QID) | TRANSDERMAL | 0 refills | Status: DC
Start: 2016-04-13 — End: 2016-05-21

## 2016-04-13 MED ORDER — MELOXICAM 15 MG PO TABS: 15.0000 mg | ORAL_TABLET | Freq: Every day | ORAL | 0 refills | Status: DC

## 2016-04-13 NOTE — Patient Instructions (Addendum)
Take meloxicam daily for the next 2 months. You can also use topical NSAID as prescribed. Use brace daily for the next four weeks as well and try to avoid movements that irritate it. Start doing the stretches below as tolerated.  If no improvement in 2 weeks, return to clinic as we may need to try a prednisone course at this time.   Tennis Elbow Tennis elbow is puffiness (inflammation) of the outer tendons of your forearm close to your elbow. Your tendons attach your muscles to your bones. Tennis elbow can happen in any sport or job in which you use your elbow too much. It is caused by doing the same motion over and over. Tennis elbow can cause:  Pain and tenderness in your forearm and the outer part of your elbow.  A burning feeling. This runs from your elbow through your arm.  Weak grip in your hands. Follow these instructions at home: Activity  Rest your elbow and wrist as told by your doctor. Try to avoid any activities that caused the problem until your doctor says that you can do them again.  If a physical therapist teaches you exercises, do all of them as told.  If you lift an object, lift it with your palm facing up. This is easier on your elbow. Lifestyle  If your tennis elbow is caused by sports, check your equipment and make sure that:  You are using it correctly.  It fits you well.  If your tennis elbow is caused by work, take breaks often, if you are able. Talk with your manager about doing your work in a way that is safe for you.  If your tennis elbow is caused by computer use, talk with your manager about any changes that can be made to your work setup. General instructions  If told, apply ice to the painful area:  Put ice in a plastic bag.  Place a towel between your skin and the bag.  Leave the ice on for 20 minutes, 2-3 times per day.  Take medicines only as told by your doctor.  If you were given a brace, wear it as told by your doctor.  Keep all  follow-up visits as told by your doctor. This is important. Contact a doctor if:  Your pain does not get better with treatment.  Your pain gets worse.  You have weakness in your forearm, hand, or fingers.  You cannot feel your forearm, hand, or fingers. This information is not intended to replace advice given to you by your health care provider. Make sure you discuss any questions you have with your health care provider. Document Released: 08/26/2009 Document Revised: 11/06/2015 Document Reviewed: 03/04/2014 Elsevier Interactive Patient Education  2017 [place] Ask your health care provider which exercises are safe for you. Do exercises exactly as told by your health care provider and adjust them as directed. It is normal to feel mild stretching, pulling, tightness, or discomfort as you do these exercises, but you should stop right away if you feel sudden pain or your pain gets worse. Do not begin these exercises until told by your health care provider. Stretching and range of motion exercises These exercises warm up your muscles and joints and improve the movement and flexibility of your elbow. These exercises also help to relieve pain, numbness, and tingling. Exercise A: Wrist extensor stretch 1. Extend your left / right elbow with your fingers pointing down. 2. Gently pull the palm of  your left / right hand toward you until you feel a gentle stretch on the top of your forearm. 3. To increase the stretch, push your left / right hand toward the outer edge or pinkie side of your forearm. 4. Hold this position for __________ seconds. Repeat __________ times. Complete this exercise __________ times a day. If directed by your health care provider, repeat this stretch except do it with a bent elbow this time. Exercise B: Wrist flexor stretch 1. Extend your left / right elbow and turn your palm upward. 2. Gently pull your left / right palm and fingertips back so your  wrist extends and your fingers point more toward the ground. 3. You should feel a gentle stretch on the inside of your forearm. 4. Hold this position for __________ seconds. Repeat __________ times. Complete this exercise __________ times a day. If directed by your health care provider, repeat this stretch except do it with a bent elbow this time. Strengthening exercises These exercises build strength and endurance in your elbow. Endurance is the ability to use your muscles for a long time, even after they get tired. Exercise C: Wrist extensors 1. Sit with your left / right forearm palm-down and fully supported on a table or countertop. Your elbow should be resting below the height of your shoulder. 2. Let your left / right wrist extend over the edge of the surface. 3. Loosely hold a __________ weight or a piece of rubber exercise band or tubing in your left / right hand. Slowly curl your left / right hand up toward your forearm. If you are using band or tubing, hold the band or tubing in place with your other hand to provide resistance. 4. Hold this position for __________ seconds. 5. Slowly return to the starting position. Repeat __________ times. Complete this exercise __________ times a day. Exercise D: Radial deviators 1. Stand with a __________ weight in your left / righthand. Or, sit while holding a rubber exercise band or tubing with your other arm supported on a table or countertop. Position your hand so your thumb is on top. 2. Raise your hand upward in front of you so your thumb travels toward your forearm, or pull up on the rubber tubing. 3. Hold this position for __________ seconds. 4. Slowly return to the starting position. Repeat __________ times. Complete this exercise __________ times a day. Exercise E: Eccentric wrist extensors 1. Sit with your left / right forearm palm-down and fully supported on a table or countertop. Your elbow should be resting below the height of your  shoulder. 2. If told by your health care provider, hold a __________ weight in your hand. 3. Let your left / right wrist extend over the edge of the surface. 4. Use your other hand to lift up your left / right hand toward your forearm. Keep your forearm on the table. 5. Using only the muscles in your left / right hand, slowly lower your hand back down to the starting position. Repeat __________ times. Complete this exercise __________ times a day. This information is not intended to replace advice given to you by your health care provider. Make sure you discuss any questions you have with your health care provider. Document Released: 03/08/2005 Document Revised: 11/12/2015 Document Reviewed: 12/05/2014 Elsevier Interactive Patient Education  2017 [employer].   IF you received an x-ray today, you will receive an invoice from [facility]. Please contact [facility] at [phone] with questions or concerns regarding your invoice.  IF you received labwork today, you will receive an invoice from [facility]. Please contact [facility] at [phone] with questions or concerns regarding your invoice.   Our billing staff will not be able to assist you with questions regarding bills from these companies.  You will be contacted with the lab results as soon as they are available. The fastest way to get your results is to activate your My Chart account. Instructions are located on the last page of this paperwork. If you have not heard from [place] regarding the results in 2 weeks, please contact this office.

## 2016-04-13 NOTE — Progress Notes (Signed)
Anthony Woods  MRN: [id number] DOB: May 05, 1955  Subjective:  Anthony Woods is a 61 y.o. male seen in office today for a chief complaint of right elbow x 1 month. Has associated shooting pain when he fully extends arm and intermittent weakness. Denies acute injury, numbness, tingling, redness, and warmth. He works in [employer] and is constantly shoveling and raking. Has tried biofreeze and ice pack with mild relief. Had a similar problem in the right elbow 15 years ago and had steroid injection which seemed to provide relief.   Review of Systems  Constitutional: Negative for chills, diaphoresis, fatigue and fever.  Musculoskeletal: Negative for neck pain and neck stiffness.    Patient Active Problem List   Diagnosis Date Noted  . Allergic rhinitis 08/12/2015  . Chest pain 04/14/2015  . Urinary frequency 04/14/2015  . Osteoarthritis of right knee 12/09/2014  . Snoring 09/25/2013  . Rash and nonspecific skin eruption 08/08/2012  . Insomnia 08/08/2012  . Morbid obesity ([place]) 06/10/2011  . Well adult exam 06/10/2011  . Cough due to angiotensin-converting enzyme inhibitor 10/08/2010  . SHOULDER PAIN 03/09/2010  . NECK PAIN 03/09/2010  . TOBACCO USER 06/05/2009  . BRONCHITIS, ACUTE 12/13/2007  . Toxic effect of venom 08/02/2007  . Hypothyroidism 04/04/2007  . Diabetes mellitus type 2, uncontrolled ([place]) 04/04/2007  . Essential hypertension 04/04/2007  . Coronary atherosclerosis 04/04/2007  . Edema 04/04/2007    Current Outpatient Prescriptions on File Prior to Visit  Medication Sig Dispense Refill  . acetaminophen (TYLENOL) 650 MG CR tablet Take 650 mg by mouth every 12 (twelve) hours.     Marland Kitchen aspirin 81 MG EC tablet Take 81 mg by mouth daily.      Marland Kitchen atenolol-chlorthalidone (TENORETIC) 100-25 MG tablet Take 1 tablet by mouth daily. 90 tablet 3  . diphenhydrAMINE (BENADRYL) 25 MG tablet Take 1 tablet (25 mg total) by mouth every 6 (six) hours. (Patient taking differently: Take  25 mg by mouth every 6 (six) hours as needed for allergies. For allergic reactions) 20 tablet 0  . EPINEPHrine 0.3 mg/0.3 mL IJ SOAJ injection Inject 0.3 mLs (0.3 mg total) into the muscle once. For anaphylactic reaction 2 Device 0  . glucagon (GLUCAGON EMERGENCY) 1 MG injection Inject 1 mg into the vein once as needed. 1 each 4  . HUMALOG MIX 75/25 KWIKPEN (75-25) 100 UNIT/ML Kwikpen Inject 30-40 Units into the skin 2 (two) times daily with a meal. Injects 40 units daily in morning and 30 units daily in evening. (Patient taking differently: Inject 20-50 Units into the skin 2 (two) times daily with a meal. 50 units in the morning and 20 units in the evening) 15 mL 3  . ibuprofen (ADVIL,MOTRIN) 200 MG tablet Take 200 mg by mouth 2 (two) times daily.    . Lancets (ONETOUCH ULTRASOFT) lancets 1 each by Other route 2 (two) times daily.  12  . levothyroxine (SYNTHROID, LEVOTHROID) 175 MCG tablet Take 1 tablet (175 mcg total) by mouth daily. 90 tablet 3  . linagliptin (TRADJENTA) 5 MG TABS tablet Take 1 tablet (5 mg total) by mouth daily. 90 tablet 3  . loratadine (CLARITIN) 10 MG tablet Take 1 tablet (10 mg total) by mouth daily. 100 tablet 3  . losartan (COZAAR) 100 MG tablet TAKE 1 TABLET BY MOUTH DAILY. 90 tablet 3  . metFORMIN (GLUCOPHAGE) 1000 MG tablet Take 1 tablet (1,000 mg total) by mouth 2 (two) times daily with a meal. 180 tablet 3  .  ONE TOUCH ULTRA TEST test strip 1 strip by Does not apply route 2 (two) times daily.  12  . potassium chloride (K-DUR) 10 MEQ tablet Take 1 tablet (10 mEq total) by mouth 2 (two) times daily. 180 tablet 3  . simvastatin (ZOCOR) 40 MG tablet Take 1 tablet (40 mg total) by mouth daily. Yearly physical due in January must see MD for refills 30 tablet 2  . ULTICARE MICRO PEN NEEDLES 32G X 4 MM MISC USE AS DIRECTED WITH INSULIN PEN 100 each PRN   No current facility-administered medications on file prior to visit.     Allergies  Allergen Reactions  . Bee Venom  Anaphylaxis  . Actos [Pioglitazone Hydrochloride] Swelling  . Lipitor [Atorvastatin] Other (See Comments)    Patient cannot recall reaction  . Lisinopril Other (See Comments)    cough     Objective:  There were no vitals taken for this visit.  Physical Exam  Constitutional: He is oriented to person, place, and time and well-developed, well-nourished, and in no distress.  HENT:  Head: Normocephalic and atraumatic.  Eyes: Conjunctivae are normal.  Neck: Normal range of motion.  Pulmonary/Chest: Effort normal.  Musculoskeletal:       Right elbow: He exhibits normal range of motion and no swelling. Tenderness found. Lateral epicondyle tenderness noted.       Left elbow: Normal.       Right wrist: Normal.  Pain with resisted wrist extension with the right elbow in full extension  Pain in right elbow with resisted pronation of right hand, relieved when pressure applied over wrist extensors.   Neurological: He is alert and oriented to person, place, and time. Gait normal.  Skin: Skin is warm and dry.  Psychiatric: Affect normal.  Vitals reviewed.   Assessment and Plan :  1. Right elbow pain History and physical exam consistent with lateral epicondylitis. Will treat conservatively with brace, NSAIDs, rest, and stretches. Pt given brace in office. Pt instructed to return to clinic if symptoms worsen, do not improve in 2 weeks, or as needed as he may need prednisone taper at this point or PT referral.  - meloxicam (MOBIC) 15 MG tablet; Take 1 tablet (15 mg total) by mouth daily.  Dispense: 30 tablet; Refill: 0 - diclofenac sodium (VOLTAREN) 1 % GEL; Apply 1 application topically 4 (four) times daily.  Dispense: 100 g; Refill: 0  Tenna Delaine PA-C  [facility] 04/13/2016 3:00 PM

## 2016-04-15 ENCOUNTER — Encounter: Payer: Commercial Managed Care - HMO | Admitting: Internal Medicine

## 2016-05-03 ENCOUNTER — Other Ambulatory Visit: Payer: Self-pay | Admitting: Internal Medicine

## 2016-05-11 ENCOUNTER — Inpatient Hospital Stay (HOSPITAL_COMMUNITY)
Admission: EM | Admit: 2016-05-11 | Discharge: 2016-05-13 | DRG: 247 | Disposition: A | Discharge status: Home / Self Care | Payer: Commercial Managed Care - HMO | Attending: Cardiology | Admitting: Cardiology

## 2016-05-11 ENCOUNTER — Emergency Department (HOSPITAL_COMMUNITY): Payer: Commercial Managed Care - HMO

## 2016-05-11 ENCOUNTER — Encounter (HOSPITAL_COMMUNITY): Payer: Self-pay

## 2016-05-11 DIAGNOSIS — Z955 Presence of coronary angioplasty implant and graft: Secondary | ICD-10-CM

## 2016-05-11 DIAGNOSIS — H3530 Unspecified macular degeneration: Secondary | ICD-10-CM | POA: Diagnosis present

## 2016-05-11 DIAGNOSIS — E1165 Type 2 diabetes mellitus with hyperglycemia: Secondary | ICD-10-CM | POA: Diagnosis present

## 2016-05-11 DIAGNOSIS — I480 Paroxysmal atrial fibrillation: Principal | ICD-10-CM | POA: Diagnosis present

## 2016-05-11 DIAGNOSIS — E1169 Type 2 diabetes mellitus with other specified complication: Secondary | ICD-10-CM | POA: Diagnosis present

## 2016-05-11 DIAGNOSIS — E1121 Type 2 diabetes mellitus with diabetic nephropathy: Secondary | ICD-10-CM | POA: Diagnosis present

## 2016-05-11 DIAGNOSIS — Z6841 Body Mass Index (BMI) 40.0 and over, adult: Secondary | ICD-10-CM

## 2016-05-11 DIAGNOSIS — R778 Other specified abnormalities of plasma proteins: Secondary | ICD-10-CM

## 2016-05-11 DIAGNOSIS — R079 Chest pain, unspecified: Secondary | ICD-10-CM | POA: Diagnosis not present

## 2016-05-11 DIAGNOSIS — Z79899 Other long term (current) drug therapy: Secondary | ICD-10-CM

## 2016-05-11 DIAGNOSIS — I4891 Unspecified atrial fibrillation: Secondary | ICD-10-CM | POA: Diagnosis not present

## 2016-05-11 DIAGNOSIS — Z823 Family history of stroke: Secondary | ICD-10-CM

## 2016-05-11 DIAGNOSIS — Z8249 Family history of ischemic heart disease and other diseases of the circulatory system: Secondary | ICD-10-CM

## 2016-05-11 DIAGNOSIS — Z91030 Bee allergy status: Secondary | ICD-10-CM

## 2016-05-11 DIAGNOSIS — R0602 Shortness of breath: Secondary | ICD-10-CM | POA: Diagnosis not present

## 2016-05-11 DIAGNOSIS — R7989 Other specified abnormal findings of blood chemistry: Secondary | ICD-10-CM

## 2016-05-11 DIAGNOSIS — E669 Obesity, unspecified: Secondary | ICD-10-CM | POA: Diagnosis present

## 2016-05-11 DIAGNOSIS — Z833 Family history of diabetes mellitus: Secondary | ICD-10-CM

## 2016-05-11 DIAGNOSIS — F17200 Nicotine dependence, unspecified, uncomplicated: Secondary | ICD-10-CM | POA: Diagnosis present

## 2016-05-11 DIAGNOSIS — F17210 Nicotine dependence, cigarettes, uncomplicated: Secondary | ICD-10-CM | POA: Diagnosis present

## 2016-05-11 DIAGNOSIS — I10 Essential (primary) hypertension: Secondary | ICD-10-CM | POA: Diagnosis present

## 2016-05-11 DIAGNOSIS — Z888 Allergy status to other drugs, medicaments and biological substances status: Secondary | ICD-10-CM

## 2016-05-11 DIAGNOSIS — I25119 Atherosclerotic heart disease of native coronary artery with unspecified angina pectoris: Secondary | ICD-10-CM | POA: Diagnosis present

## 2016-05-11 DIAGNOSIS — Z7982 Long term (current) use of aspirin: Secondary | ICD-10-CM

## 2016-05-11 DIAGNOSIS — R748 Abnormal levels of other serum enzymes: Secondary | ICD-10-CM | POA: Diagnosis present

## 2016-05-11 DIAGNOSIS — E1159 Type 2 diabetes mellitus with other circulatory complications: Secondary | ICD-10-CM

## 2016-05-11 DIAGNOSIS — Z800 Family history of malignant neoplasm of digestive organs: Secondary | ICD-10-CM

## 2016-05-11 DIAGNOSIS — E782 Mixed hyperlipidemia: Secondary | ICD-10-CM | POA: Diagnosis present

## 2016-05-11 DIAGNOSIS — E039 Hypothyroidism, unspecified: Secondary | ICD-10-CM | POA: Diagnosis present

## 2016-05-11 DIAGNOSIS — R51 Headache: Secondary | ICD-10-CM | POA: Diagnosis not present

## 2016-05-11 DIAGNOSIS — Z794 Long term (current) use of insulin: Secondary | ICD-10-CM

## 2016-05-11 HISTORY — DX: Headache, unspecified: R51.9

## 2016-05-11 HISTORY — DX: Type 2 diabetes mellitus without complications: E11.9

## 2016-05-11 HISTORY — DX: Headache: R51

## 2016-05-11 LAB — CBC
HCT: 48.8 % (ref 39.0–52.0)
Hemoglobin: 17.2 g/dL — ABNORMAL HIGH (ref 13.0–17.0)
MCH: 31.6 pg (ref 26.0–34.0)
MCHC: 35.2 g/dL (ref 30.0–36.0)
MCV: 89.7 fL (ref 78.0–100.0)
PLATELETS: 219 10*3/uL (ref 150–400)
RBC: 5.44 MIL/uL (ref 4.22–5.81)
RDW: 12.9 % (ref 11.5–15.5)
WBC: 15.6 10*3/uL — ABNORMAL HIGH (ref 4.0–10.5)

## 2016-05-11 LAB — BASIC METABOLIC PANEL
Anion gap: 13 (ref 5–15)
BUN: 9 mg/dL (ref 6–20)
CALCIUM: 9.8 mg/dL (ref 8.9–10.3)
CO2: 25 mmol/L (ref 22–32)
CREATININE: 1 mg/dL (ref 0.61–1.24)
Chloride: 97 mmol/L — ABNORMAL LOW (ref 101–111)
GFR calc Af Amer: >60 mL/min (ref >60)
GFR calc non Af Amer: >60 mL/min (ref >60)
Glucose, Bld: 245 mg/dL — ABNORMAL HIGH (ref 65–99)
Potassium: 4.1 mmol/L (ref 3.5–5.1)
Sodium: 135 mmol/L (ref 135–145)

## 2016-05-11 LAB — URINALYSIS, ROUTINE W REFLEX MICROSCOPIC
BACTERIA UA: NONE SEEN
Bilirubin Urine: NEGATIVE
Hgb urine dipstick: NEGATIVE
KETONES UR: NEGATIVE mg/dL
Leukocytes, UA: NEGATIVE
Nitrite: NEGATIVE
PROTEIN: 100 mg/dL — AB
SQUAMOUS EPITHELIAL / LPF: NONE SEEN
Specific Gravity, Urine: 1.003 — ABNORMAL LOW (ref 1.005–1.030)
pH: 7 (ref 5.0–8.0)

## 2016-05-11 LAB — I-STAT TROPONIN, ED: TROPONIN I, POC: 0.03 ng/mL (ref 0.00–0.08)

## 2016-05-11 MED ORDER — ASPIRIN 81 MG PO TBEC: 81.0000 mg | DELAYED_RELEASE_TABLET | Freq: Every day | ORAL | Status: DC

## 2016-05-11 MED ORDER — INSULIN LISPRO PROT & LISPRO (75-25 MIX) 100 UNIT/ML KWIKPEN: 30.0000 [IU] | PEN_INJECTOR | Freq: Two times a day (BID) | SUBCUTANEOUS | Status: DC

## 2016-05-11 MED ORDER — INSULIN ASPART 100 UNIT/ML ~~LOC~~ SOLN
0.0000 [IU] | Freq: Every day | SUBCUTANEOUS | Status: DC
  Administered 2016-05-12: 22:00:00 2 [IU] via SUBCUTANEOUS

## 2016-05-11 MED ORDER — ATENOLOL-CHLORTHALIDONE 100-25 MG PO TABS: 1.0000 | ORAL_TABLET | Freq: Every day | ORAL | Status: DC

## 2016-05-11 MED ORDER — SODIUM CHLORIDE 0.9% FLUSH
3.0000 mL | Freq: Two times a day (BID) | INTRAVENOUS | Status: DC
  Administered 2016-05-12 – 2016-05-13 (×3): 3 mL via INTRAVENOUS

## 2016-05-11 MED ORDER — DILTIAZEM LOAD VIA INFUSION
15.0000 mg | Freq: Once | INTRAVENOUS | Status: AC
  Administered 2016-05-11: 15 mg via INTRAVENOUS
  Filled 2016-05-11: qty 15

## 2016-05-11 MED ORDER — INSULIN ASPART 100 UNIT/ML ~~LOC~~ SOLN
0.0000 [IU] | Freq: Three times a day (TID) | SUBCUTANEOUS | Status: DC
  Administered 2016-05-12: 19:00:00 2 [IU] via SUBCUTANEOUS
  Administered 2016-05-12: 3 [IU] via SUBCUTANEOUS
  Administered 2016-05-12: 2 [IU] via SUBCUTANEOUS
  Administered 2016-05-13 (×2): 1 [IU] via SUBCUTANEOUS

## 2016-05-11 MED ORDER — LEVOTHYROXINE SODIUM 75 MCG PO TABS
175.0000 ug | ORAL_TABLET | Freq: Every day | ORAL | Status: DC
  Administered 2016-05-12 – 2016-05-13 (×2): 175 ug via ORAL
  Filled 2016-05-11 (×2): qty 1

## 2016-05-11 MED ORDER — DILTIAZEM HCL 100 MG IV SOLR
5.0000 mg/h | INTRAVENOUS | Status: DC
  Administered 2016-05-11: 5 mg/h via INTRAVENOUS
  Filled 2016-05-11: qty 100

## 2016-05-11 MED ORDER — SIMVASTATIN 20 MG PO TABS
40.0000 mg | ORAL_TABLET | Freq: Every day | ORAL | Status: DC
  Administered 2016-05-12 – 2016-05-13 (×3): 40 mg via ORAL
  Filled 2016-05-11: qty 1
  Filled 2016-05-11 (×2): qty 2

## 2016-05-11 MED ORDER — SODIUM CHLORIDE 0.9 % IV SOLN
Freq: Once | INTRAVENOUS | Status: AC
  Administered 2016-05-11: 21:00:00 via INTRAVENOUS

## 2016-05-11 NOTE — ED Triage Notes (Addendum)
Pt from home with chest pain and SOB that started today. Pt is chest pain free as of now. Pt currently in A-fib with no known history of such. Pt has 324mg  ASA with EMS and 281mL of NS

## 2016-05-11 NOTE — ED Notes (Signed)
Crash cart at bedside per provider request

## 2016-05-11 NOTE — ED Provider Notes (Signed)
[place] DEPT Provider Note   CSN: [id number] Arrival date & time: 05/11/16  1824     History   Chief Complaint Chief Complaint  Patient presents with  . Atrial Fibrillation  . Chest Pain    HPI   Blood pressure 158/92, pulse 68, resp. rate 19, height 5\' 5"  (1.651 m), weight 113.4 kg, SpO2 97 %.  Anthony Woods is a 61 y.o. male with past medical history significant for tobacco use, obesity, poorly controlled insulin-dependent diabetes,, hypertension, lipidemia, CAD (Per Pt: had nonobstructive cath, has not followed with cardiology in 2 years) started feeling lightheaded like his blood sugar was low, he sat down and ate some food this resolved the lightheaded sensation and started having severe headache, 10 out of 10 atypical in severity and location for this patient on the left temporal region without any associated change of vision, dysarthria, ataxia, cervicalgia. He sat down in a dark room developed a left-sided nonradiating chest pain with associated shortness of breath, diaphoresis and nausea. Is given full dose aspirin by EMS, he is chest pain-free. No palpitations or syncope. He severed been told that he has A. fib or regular heart rate, he is not anticoagulated. He was in his normal state of health leading up to this morning.   Past Medical History:  Diagnosis Date  . Allergy to bee sting   . Arthritis   . CAD (coronary artery disease)   . HTN (hypertension)   . Hyperlipidemia   . Hypothyroidism   . Macular degeneration   . Obesity   . Type II or unspecified type diabetes mellitus without mention of complication, not stated as uncontrolled     Patient Active Problem List   Diagnosis Date Noted  . Allergic rhinitis 08/12/2015  . Chest pain 04/14/2015  . Urinary frequency 04/14/2015  . Osteoarthritis of right knee 12/09/2014  . Snoring 09/25/2013  . Rash and nonspecific skin eruption 08/08/2012  . Insomnia 08/08/2012  . Morbid obesity ([place]) 06/10/2011  .  Well adult exam 06/10/2011  . Cough due to angiotensin-converting enzyme inhibitor 10/08/2010  . SHOULDER PAIN 03/09/2010  . NECK PAIN 03/09/2010  . TOBACCO USER 06/05/2009  . BRONCHITIS, ACUTE 12/13/2007  . Toxic effect of venom 08/02/2007  . Hypothyroidism 04/04/2007  . Diabetes mellitus type 2, uncontrolled ([place]) 04/04/2007  . Essential hypertension 04/04/2007  . Coronary atherosclerosis 04/04/2007  . Edema 04/04/2007    Past Surgical History:  Procedure Laterality Date  . KNEE SURGERY     Meniscus removal  . LEFT HEART CATHETERIZATION WITH CORONARY ANGIOGRAM N/A 02/04/2012   Procedure: LEFT HEART CATHETERIZATION WITH CORONARY ANGIOGRAM;  Surgeon: Burnell Blanks, MD;  Location: [facility] LAB;  Service: Cardiovascular;  Laterality: N/A;  . WRIST GANGLION EXCISION     left       Home Medications    Prior to Admission medications   Medication Sig Start Date End Date Taking? Authorizing Provider  acetaminophen (TYLENOL) 650 MG CR tablet Take 650 mg by mouth every 8 (eight) hours as needed for pain.    Yes Historical Provider, MD  aspirin 81 MG EC tablet Take 81 mg by mouth daily.     Yes Historical Provider, MD  atenolol-chlorthalidone (TENORETIC) 100-25 MG tablet Take 1 tablet by mouth daily. 07/14/15  Yes [name], MD  diclofenac sodium (VOLTAREN) 1 % GEL Apply 1 application topically 4 (four) times daily. Patient taking differently: Apply 1 application topically daily as needed (pain).  04/13/16  Yes [place]  D Wiseman, PA-C  diphenhydrAMINE (BENADRYL) 25 MG tablet Take 1 tablet (25 mg total) by mouth every 6 (six) hours. Patient taking differently: Take 25 mg by mouth every 6 (six) hours as needed for allergies. For allergic reactions 11/11/14  Yes Fredia Sorrow, MD  EPINEPHrine 0.3 mg/0.3 mL IJ SOAJ injection Inject 0.3 mLs (0.3 mg total) into the muscle once. For anaphylactic reaction 10/06/15  Yes Noemi Chapel, MD  HUMALOG MIX 75/25 KWIKPEN (75-25) 100  UNIT/ML Kwikpen Inject 30-40 Units into the skin 2 (two) times daily with a meal. Injects 40 units daily in morning and 30 units daily in evening. Patient taking differently: Inject 30-50 Units into the skin 2 (two) times daily with a meal. 50 units in the morning and 30 units in the evening 08/12/15  Yes [name], MD  ibuprofen (ADVIL,MOTRIN) 200 MG tablet Take 200 mg by mouth every 8 (eight) hours as needed for moderate pain.    Yes Historical Provider, MD  levothyroxine (SYNTHROID, LEVOTHROID) 175 MCG tablet Take 1 tablet (175 mcg total) by mouth daily. 08/12/15  Yes [name], MD  linagliptin (TRADJENTA) 5 MG TABS tablet Take 1 tablet (5 mg total) by mouth daily. 08/12/15  Yes [name], MD  losartan (COZAAR) 100 MG tablet TAKE 1 TABLET BY MOUTH DAILY. 09/18/15  Yes [name], MD  metFORMIN (GLUCOPHAGE) 1000 MG tablet Take 1 tablet (1,000 mg total) by mouth 2 (two) times daily with a meal. 08/12/15  Yes [name], MD  potassium chloride (K-DUR) 10 MEQ tablet Take 1 tablet (10 mEq total) by mouth 2 (two) times daily. 08/12/15  Yes [name], MD  simvastatin (ZOCOR) 40 MG tablet Take 1 tablet (40 mg total) by mouth daily. Yearly physical due in January must see MD for refills 02/02/16  Yes Cassandria Anger, MD    Family History Family History  Problem Relation Age of Onset  . Diabetes Father   . Cancer Father     stomach  . CVA Father   . Stomach cancer Father   . Diabetes Mother   . Cancer Mother     male ca  . Colon cancer Mother 42  . Hypertension Other   . Diabetes Other   . CAD Maternal Grandmother     Social History Social History  Substance Use Topics  . Smoking status: Current Every Day Smoker    Packs/day: 2.00    Years: 40.00    Types: Cigarettes  . Smokeless tobacco: Never Used  . Alcohol use No     Allergies   Bee venom; Actos [pioglitazone hydrochloride]; Lipitor [atorvastatin]; and  Lisinopril   Review of Systems Review of Systems  10 systems reviewed and found to be negative, except as noted in the HPI.  Physical Exam Updated Vital Signs BP 123/97   Pulse (!) 55   Temp 98.2 F (36.8 C) (Oral)   Resp 18   Ht 5\' 5"  (1.651 m)   Wt 113.4 kg   SpO2 92%   BMI 41.60 kg/m   Physical Exam  Constitutional: He is oriented to person, place, and time. He appears well-developed and well-nourished. No distress.  Alert and affable  HENT:  Head: Normocephalic and atraumatic.  Mouth/Throat: Oropharynx is clear and moist.  Eyes: Conjunctivae and EOM are normal. Pupils are equal, round, and reactive to light.  Neck: Normal range of motion.  FROM to C-spine. Pt can touch chin to chest without discomfort. No TTP  of midline cervical spine.   Cardiovascular: Intact distal pulses.   Tachycardia, irregular  Pulmonary/Chest: Effort normal and breath sounds normal.  Abdominal: Soft. There is no tenderness.  Musculoskeletal: Normal range of motion.  Neurological: He is alert and oriented to person, place, and time.  II-Visual fields grossly intact. III/IV/VI-Extraocular movements intact.  Pupils reactive bilaterally. V/VII-Smile symmetric, equal eyebrow raise,  facial sensation intact VIII- Hearing grossly intact IX/X-Normal gag XI-bilateral shoulder shrug XII-midline tongue extension Motor: 5/5 bilaterally with normal tone and bulk Cerebellar: Normal finger-to-nose  and normal heel-to-shin test.      Skin: He is diaphoretic.  Psychiatric: He has a normal mood and affect.  Nursing note and vitals reviewed.    ED Treatments / Results  Labs (all labs ordered are listed, but only abnormal results are displayed) Labs Reviewed  BASIC METABOLIC PANEL - Abnormal; Notable for the following:       Result Value   Chloride 97 (*)    Glucose, Bld 245 (*)    All other components within normal limits  CBC - Abnormal; Notable for the following:    WBC 15.6 (*)     Hemoglobin 17.2 (*)    All other components within normal limits  INFLUENZA PANEL BY PCR (TYPE A & B)  URINALYSIS, ROUTINE W REFLEX MICROSCOPIC  I-STAT TROPOININ, ED    EKG  EKG Interpretation None       Radiology Ct Head Wo Contrast  Result Date: 05/11/2016 CLINICAL DATA:  Severe posterior headache for 3 days. History of hypertension, diabetes, hyperlipidemia. EXAM: CT HEAD WITHOUT CONTRAST TECHNIQUE: Contiguous axial images were obtained from the base of the skull through the vertex without intravenous contrast. COMPARISON:  None. FINDINGS: BRAIN: The ventricles and sulci are mildly prominent for age. No intraparenchymal hemorrhage, mass effect nor midline shift. Patchy supratentorial white matter hypodensities. No acute large vascular territory infarcts. No abnormal extra-axial fluid collections. Basal cisterns are patent. VASCULAR: Mild calcific atherosclerosis of the carotid siphons. Mildly dense intracranial vessels associated with hemoconcentration. SKULL: No skull fracture. No significant scalp soft tissue swelling. SINUSES/ORBITS: The mastoid air-cells and included paranasal sinuses are well-aerated.The included ocular globes and orbital contents are non-suspicious. OTHER: Nonspecific bilateral facial skin thickening. IMPRESSION: No acute intracranial process. Borderline parenchymal brain volume loss for age. Moderate white matter changes compatible with chronic small vessel ischemic disease. Electronically Signed   By: Elon Alas M.D.   On: 05/11/2016 19:45   Dg Chest Port 1 View  Result Date: 05/11/2016 CLINICAL DATA:  Headache, chest pain and shortness of breath. EXAM: PORTABLE CHEST 1 VIEW COMPARISON:  03/27/2012 FINDINGS: Cardiomediastinal silhouette is normal. Mediastinal contours appear intact. Calcific atherosclerotic disease of the aorta. There is no evidence of focal airspace consolidation, pleural effusion or pneumothorax. Osseous structures are without acute  abnormality. Soft tissues are grossly normal. IMPRESSION: No evidence of acute cardiopulmonary process. Calcific atherosclerotic disease of the aorta. Electronically Signed   By: Fidela Salisbury M.D.   On: 05/11/2016 19:11    Procedures Procedures (including critical care time)  CRITICAL CARE Performed by: Monico Blitz   Total critical care time: 45 minutes  Critical care time was exclusive of separately billable procedures and treating other patients.  Critical care was necessary to treat or prevent imminent or life-threatening deterioration.  Critical care was time spent personally by me on the following activities: development of treatment plan with patient and/or surrogate as well as nursing, discussions with consultants, evaluation of patient's response to treatment, examination of  patient, obtaining history from patient or surrogate, ordering and performing treatments and interventions, ordering and review of laboratory studies, ordering and review of radiographic studies, pulse oximetry and re-evaluation of patient's condition.   Medications Ordered in ED Medications  diltiazem (CARDIZEM) 1 mg/mL load via infusion 15 mg (15 mg Intravenous Bolus from Bag 05/11/16 2115)    And  diltiazem (CARDIZEM) 100 mg in dextrose 5 % 100 mL (1 mg/mL) infusion (5 mg/hr Intravenous Bolus from Bag 05/11/16 2116)  0.9 %  sodium chloride infusion (not administered)     Initial Impression / Assessment and Plan / ED Course  I have reviewed the triage vital signs and the nursing notes.  Pertinent labs & imaging results that were available during my care of the patient were reviewed by me and considered in my medical decision making (see chart for details).     Vitals:   05/11/16 2004 05/11/16 2015 05/11/16 2030 05/11/16 2045  BP: 101/72 101/72 111/75 123/97  Pulse: 81 (!) 47 100 (!) 55  Resp: 14  23 18   Temp: 98.2 F (36.8 C)     TempSrc: Oral     SpO2: 96% 93% 92% 92%  Weight:       Height:        Anthony Woods is 61 y.o. male presenting with New onset A. fib, it appears that this started in between 25 and 2 PM. States that 2 PM he developed a severe headache, was thunderclap in onset. Neurologic exam nonfocal. No meningeal signs, given the severity of the headache, head CT is ordered and obtained within 6 hours of symptom onset, negative acute findings.  Patient remains in rapid ventricular response, chest x-rays without infiltrate. He does have a leukocytosis of 15.6 which I think is likely secondary to stress response.  Check EKG here shows remains persistently tachycardic, blood pressure was initially strong over 150 and going down in the 120s. Patient will be started on Cardizem 15 mg bolus and then drip. Discussed with cardiology Dr. Eula Fried who states that this patient can be cardioverted in the a.m., he agrees with rate control and hospitalist admission. He is concerned that with this white count there may be a source of infection I will obtain flu swab and urinalysis. Chest x-rays without infiltrate.  After discussing with Dr. Alcario Drought I have talked to cardiology and they will primarily admit this patient.  Final Clinical Impressions(s) / ED Diagnoses   Final diagnoses:  New onset atrial fibrillation [facility])    New Prescriptions New Prescriptions   No medications on file     Monico Blitz, PA-C 05/11/16 2140    Julianne Rice, MD 05/11/16 [phone]

## 2016-05-11 NOTE — ED Notes (Signed)
Patient transported to CT 

## 2016-05-11 NOTE — H&P (Signed)
CARDIOLOGY OBSERVATION HISTORY AND PHYSICAL EXAMINATION NOTE  Patient ID: Anthony Woods MRN: [id number], DOB/AGE: [birth year]   Admit date: 05/11/2016   Primary Physician: Walker Kehr, MD Primary Cardiologist: Lauree Chandler MD  Reason for admission: Atrial fibrillation  HPI: This is a 61 y.o. male with history of mild obstructive CAD (02/04/2012), HTN, IDDM, HLD, obesity, ongoing tobacco abuse (50 pack years) presented for evaluation to the ED. Patient has a long standing history of CP for which he underwent cardiac cath in 2003 and then 2013 which did not demonstrate any obstructive dz. Pt is on BB and has been managed with aspirin and statin since then. Today he had another episode of CP, and headache which he said was 10/10 in severity but without any symptoms of stroke, associated with SOB, diaphoresis and nausea. CT head was performed in the ED which did not demonstrate any stroke. However he was found to be in new onset afib for which he is being admitted to cardiology.  In the ED, he was started on cardiazem drip after bolus of 15 mg of diltiazem with some response. EMS gave full dose aspirin, however he is on 81 mg of aspirin at home. Flu swab and UA were also obtained in the ED to r/o infection since WBC was elevated.   Problem List: Past Medical History:  Diagnosis Date  . Allergy to bee sting   . Arthritis   . CAD (coronary artery disease)   . HTN (hypertension)   . Hyperlipidemia   . Hypothyroidism   . Macular degeneration   . Obesity   . Type II or unspecified type diabetes mellitus without mention of complication, not stated as uncontrolled     Past Surgical History:  Procedure Laterality Date  . KNEE SURGERY     Meniscus removal  . LEFT HEART CATHETERIZATION WITH CORONARY ANGIOGRAM N/A 02/04/2012   Procedure: LEFT HEART CATHETERIZATION WITH CORONARY ANGIOGRAM;  Surgeon: Burnell Blanks, MD;  Location[facility];  Service: Cardiovascular;   Laterality: N/A;  . WRIST GANGLION EXCISION     left     Allergies:  Allergies  Allergen Reactions  . Bee Venom Anaphylaxis  . Actos [Pioglitazone Hydrochloride] Swelling  . Lipitor [Atorvastatin] Other (See Comments)    Patient cannot recall reaction  . Lisinopril Other (See Comments)    cough     Home Medications Current Facility-Administered Medications  Medication Dose Route Frequency Provider Last Rate Last Dose  . 0.9 %  sodium chloride infusion   Intravenous Once [employer], PA-C      . diltiazem (CARDIZEM) 100 mg in dextrose 5 % 100 mL (1 mg/mL) infusion  5-15 mg/hr Intravenous Continuous Nicole Pisciotta, PA-C 5 mL/hr at 05/11/16 2116 5 mg/hr at 05/11/16 2116   Current Outpatient Prescriptions  Medication Sig Dispense Refill  . acetaminophen (TYLENOL) 650 MG CR tablet Take 650 mg by mouth every 8 (eight) hours as needed for pain.     Marland Kitchen aspirin 81 MG EC tablet Take 81 mg by mouth daily.      Marland Kitchen atenolol-chlorthalidone (TENORETIC) 100-25 MG tablet Take 1 tablet by mouth daily. 90 tablet 3  . diclofenac sodium (VOLTAREN) 1 % GEL Apply 1 application topically 4 (four) times daily. (Patient taking differently: Apply 1 application topically daily as needed (pain). ) 100 g 0  . diphenhydrAMINE (BENADRYL) 25 MG tablet Take 1 tablet (25 mg total) by mouth every 6 (six) hours. (Patient taking differently: Take 25 mg by mouth every 6 (  six) hours as needed for allergies. For allergic reactions) 20 tablet 0  . EPINEPHrine 0.3 mg/0.3 mL IJ SOAJ injection Inject 0.3 mLs (0.3 mg total) into the muscle once. For anaphylactic reaction 2 Device 0  . HUMALOG MIX 75/25 KWIKPEN (75-25) 100 UNIT/ML Kwikpen Inject 30-40 Units into the skin 2 (two) times daily with a meal. Injects 40 units daily in morning and 30 units daily in evening. (Patient taking differently: Inject 30-50 Units into the skin 2 (two) times daily with a meal. 50 units in the morning and 30 units in the evening) 15 mL 3  .  ibuprofen (ADVIL,MOTRIN) 200 MG tablet Take 200 mg by mouth every 8 (eight) hours as needed for moderate pain.     Marland Kitchen levothyroxine (SYNTHROID, LEVOTHROID) 175 MCG tablet Take 1 tablet (175 mcg total) by mouth daily. 90 tablet 3  . linagliptin (TRADJENTA) 5 MG TABS tablet Take 1 tablet (5 mg total) by mouth daily. 90 tablet 3  . losartan (COZAAR) 100 MG tablet TAKE 1 TABLET BY MOUTH DAILY. 90 tablet 3  . metFORMIN (GLUCOPHAGE) 1000 MG tablet Take 1 tablet (1,000 mg total) by mouth 2 (two) times daily with a meal. 180 tablet 3  . potassium chloride (K-DUR) 10 MEQ tablet Take 1 tablet (10 mEq total) by mouth 2 (two) times daily. 180 tablet 3  . simvastatin (ZOCOR) 40 MG tablet Take 1 tablet (40 mg total) by mouth daily. Yearly physical due in January must see MD for refills 30 tablet 2     Family History  Problem Relation Age of Onset  . Diabetes Father   . Cancer Father     stomach  . CVA Father   . Stomach cancer Father   . Diabetes Mother   . Cancer Mother     male ca  . Colon cancer Mother 70  . Hypertension Other   . Diabetes Other   . CAD Maternal Grandmother      Social History   Social History  . Marital status: Married    Spouse name: N/A  . Number of children: 1  . Years of education: N/A   Occupational History  . [employer]   . [employer]   Social History Main Topics  . Smoking status: Current Every Day Smoker    Packs/day: 2.00    Years: 40.00    Types: Cigarettes  . Smokeless tobacco: Never Used  . Alcohol use No  . Drug use: No  . Sexual activity: Yes   Other Topics Concern  . Not on file   Social History Narrative  . No narrative on file     Review of Systems: General: chills, denied fevers, weight gain   Cardiovascular: chest pain, dyspnea, leg edema  Dermatological: negative for rash Respiratory: productive cough and sneezing  Urologic: negative for hematuria Abdominal: some nausea and abdominal pain in the epigastric  region  Neurologic: dizziness Endocrine: diabetes, hypothyroidism Immunological: no lymph adenopathy Psych: non homicidal/suicidal  Physical Exam: Vitals: BP 107/65   Pulse 92   Temp 98.2 F (36.8 C) (Oral)   Resp 20   Ht 5\' 5"  (1.651 m)   Wt 113.4 kg (250 lb)   SpO2 94%   BMI 41.60 kg/m  General: not in acute distress Neck: JVP flat, neck supple Heart: irregular rate and rhythm, S1, S2, no murmurs  Lungs: CTAB  GI: non tender, non distended, bowel sounds present Extremities: trace bilateral lower extremity edema Neuro: AAO  x 3  Psych: normal affect, no anxiety   Labs:   Results for orders placed or performed during the hospital encounter of 05/11/16 (from the past 24 hour(s))  Basic metabolic panel     Status: Abnormal   Collection Time: 05/11/16  6:40 PM  Result Value Ref Range   Sodium 135 135 - 145 mmol/L   Potassium 4.1 3.5 - 5.1 mmol/L   Chloride 97 (L) 101 - 111 mmol/L   CO2 25 22 - 32 mmol/L   Glucose, Bld 245 (H) 65 - 99 mg/dL   BUN 9 6 - 20 mg/dL   Creatinine, Ser 1.00 0.61 - 1.24 mg/dL   Calcium 9.8 8.9 - 10.3 mg/dL   GFR calc non Af Amer >60 >60 mL/min   GFR calc Af Amer >60 >60 mL/min   Anion gap 13 5 - 15  CBC     Status: Abnormal   Collection Time: 05/11/16  6:40 PM  Result Value Ref Range   WBC 15.6 (H) 4.0 - 10.5 K/uL   RBC 5.44 4.22 - 5.81 MIL/uL   Hemoglobin 17.2 (H) 13.0 - 17.0 g/dL   HCT 48.8 39.0 - 52.0 %   MCV 89.7 78.0 - 100.0 fL   MCH 31.6 26.0 - 34.0 pg   MCHC 35.2 30.0 - 36.0 g/dL   RDW 12.9 11.5 - 15.5 %   Platelets 219 150 - 400 K/uL  I-stat troponin, ED     Status: None   Collection Time: 05/11/16  6:54 PM  Result Value Ref Range   Troponin i, poc 0.03 0.00 - 0.08 ng/mL   Comment 3             Radiology/Studies: Ct Head Wo Contrast  Result Date: 05/11/2016 CLINICAL DATA:  Severe posterior headache for 3 days. History of hypertension, diabetes, hyperlipidemia. EXAM: CT HEAD WITHOUT CONTRAST TECHNIQUE: Contiguous axial  images were obtained from the base of the skull through the vertex without intravenous contrast. COMPARISON:  None. FINDINGS: BRAIN: The ventricles and sulci are mildly prominent for age. No intraparenchymal hemorrhage, mass effect nor midline shift. Patchy supratentorial white matter hypodensities. No acute large vascular territory infarcts. No abnormal extra-axial fluid collections. Basal cisterns are patent. VASCULAR: Mild calcific atherosclerosis of the carotid siphons. Mildly dense intracranial vessels associated with hemoconcentration. SKULL: No skull fracture. No significant scalp soft tissue swelling. SINUSES/ORBITS: The mastoid air-cells and included paranasal sinuses are well-aerated.The included ocular globes and orbital contents are non-suspicious. OTHER: Nonspecific bilateral facial skin thickening. IMPRESSION: No acute intracranial process. Borderline parenchymal brain volume loss for age. Moderate white matter changes compatible with chronic small vessel ischemic disease. Electronically Signed   By: Elon Alas M.D.   On: 05/11/2016 19:45   Dg Chest Port 1 View  Result Date: 05/11/2016 CLINICAL DATA:  Headache, chest pain and shortness of breath. EXAM: PORTABLE CHEST 1 VIEW COMPARISON:  03/27/2012 FINDINGS: Cardiomediastinal silhouette is normal. Mediastinal contours appear intact. Calcific atherosclerotic disease of the aorta. There is no evidence of focal airspace consolidation, pleural effusion or pneumothorax. Osseous structures are without acute abnormality. Soft tissues are grossly normal. IMPRESSION: No evidence of acute cardiopulmonary process. Calcific atherosclerotic disease of the aorta. Electronically Signed   By: Fidela Salisbury M.D.   On: 05/11/2016 19:11    EKG: atrial fibrillation with RVR Cardiac cath in 2013 Mild CAD  Medical decision making:  Discussed care with the patient Discussed care with the physician on the phone Reviewed labs and imaging  personally Reviewed  prior records  ASSESSMENT AND PLAN:  This is a 61 y.o. male with HTN, HLD, IDDM, obesity, mild CAD presented with new onset atrial fibrillation and chest pain     Active Problems:   Atrial fibrillation with RVR ([place])  New onset atrial fibrillation with RVR, nonvalvular atrial fibrillation, rate controlled on diltiazem drip, unclear trigger (may be URTI) (CHADS2VASC = 2; HTN and diabetes) Maintained on IV heparin for anticoagulation, echo ordered  Rate control started by using diltiazem drip w/success   Consider TEE/DCCV  Will keep patient NPO post midnight Checking TSH, electrolytes Keep potassium >4, calcium >9, magnesium >2 Repeat echocardiogram to evaluate for LVEF/tachycardia induced cardiomyoapthy  Atypical cardiac chest pain, possible ACS Admit for observation Cycle troponin Serial EKGs NPO post midnight  Lipid panel, HbA1c, TSH Aspirin and statin  Echocardiogram in AM  Hypertension, essential Continue atenolol and chlorthalidone Goal <140/90  Diabetes mellitus with nephropathy SSI, finger sticks No oral hypoglycemics HbA1c  Hyperlipidemia, mixed Lipid panel ordered Simvastatin 40 mg daily   Hypothyroidism - checking TSH  Tobacco use Counseled for smoking cessation  Signed, Flossie Dibble, MD MS 05/11/2016, 9:48 PM

## 2016-05-11 NOTE — ED Notes (Signed)
Pt complained of SOB, O2 level WNL, placed pt on 2L O2 for comfort

## 2016-05-11 NOTE — ED Notes (Signed)
Pt given diet coke, ok'd by [employer]

## 2016-05-12 ENCOUNTER — Encounter (HOSPITAL_COMMUNITY)
Admission: EM | Disposition: A | Discharge status: Home / Self Care | Payer: Self-pay | Source: Home / Self Care | Attending: Cardiology

## 2016-05-12 ENCOUNTER — Other Ambulatory Visit (HOSPITAL_COMMUNITY): Payer: Self-pay

## 2016-05-12 ENCOUNTER — Encounter (HOSPITAL_COMMUNITY): Payer: Self-pay | Admitting: General Practice

## 2016-05-12 DIAGNOSIS — I4891 Unspecified atrial fibrillation: Secondary | ICD-10-CM | POA: Diagnosis present

## 2016-05-12 DIAGNOSIS — Z794 Long term (current) use of insulin: Secondary | ICD-10-CM | POA: Diagnosis not present

## 2016-05-12 DIAGNOSIS — H3530 Unspecified macular degeneration: Secondary | ICD-10-CM | POA: Diagnosis present

## 2016-05-12 DIAGNOSIS — Z888 Allergy status to other drugs, medicaments and biological substances status: Secondary | ICD-10-CM | POA: Diagnosis not present

## 2016-05-12 DIAGNOSIS — Z823 Family history of stroke: Secondary | ICD-10-CM | POA: Diagnosis not present

## 2016-05-12 DIAGNOSIS — Z91030 Bee allergy status: Secondary | ICD-10-CM | POA: Diagnosis not present

## 2016-05-12 DIAGNOSIS — R002 Palpitations: Secondary | ICD-10-CM | POA: Diagnosis not present

## 2016-05-12 DIAGNOSIS — Z800 Family history of malignant neoplasm of digestive organs: Secondary | ICD-10-CM | POA: Diagnosis not present

## 2016-05-12 DIAGNOSIS — I480 Paroxysmal atrial fibrillation: Secondary | ICD-10-CM | POA: Diagnosis not present

## 2016-05-12 DIAGNOSIS — Z8249 Family history of ischemic heart disease and other diseases of the circulatory system: Secondary | ICD-10-CM | POA: Diagnosis not present

## 2016-05-12 DIAGNOSIS — E1165 Type 2 diabetes mellitus with hyperglycemia: Secondary | ICD-10-CM | POA: Diagnosis present

## 2016-05-12 DIAGNOSIS — E039 Hypothyroidism, unspecified: Secondary | ICD-10-CM | POA: Diagnosis not present

## 2016-05-12 DIAGNOSIS — Z833 Family history of diabetes mellitus: Secondary | ICD-10-CM | POA: Diagnosis not present

## 2016-05-12 DIAGNOSIS — Z6841 Body Mass Index (BMI) 40.0 and over, adult: Secondary | ICD-10-CM | POA: Diagnosis not present

## 2016-05-12 DIAGNOSIS — R7989 Other specified abnormal findings of blood chemistry: Secondary | ICD-10-CM

## 2016-05-12 DIAGNOSIS — E1121 Type 2 diabetes mellitus with diabetic nephropathy: Secondary | ICD-10-CM | POA: Diagnosis not present

## 2016-05-12 DIAGNOSIS — I2510 Atherosclerotic heart disease of native coronary artery without angina pectoris: Secondary | ICD-10-CM | POA: Diagnosis not present

## 2016-05-12 DIAGNOSIS — E782 Mixed hyperlipidemia: Secondary | ICD-10-CM | POA: Diagnosis present

## 2016-05-12 DIAGNOSIS — F17200 Nicotine dependence, unspecified, uncomplicated: Secondary | ICD-10-CM | POA: Diagnosis present

## 2016-05-12 DIAGNOSIS — I25119 Atherosclerotic heart disease of native coronary artery with unspecified angina pectoris: Secondary | ICD-10-CM | POA: Diagnosis present

## 2016-05-12 DIAGNOSIS — I10 Essential (primary) hypertension: Secondary | ICD-10-CM | POA: Diagnosis present

## 2016-05-12 DIAGNOSIS — F17210 Nicotine dependence, cigarettes, uncomplicated: Secondary | ICD-10-CM | POA: Diagnosis present

## 2016-05-12 DIAGNOSIS — Z7982 Long term (current) use of aspirin: Secondary | ICD-10-CM | POA: Diagnosis not present

## 2016-05-12 DIAGNOSIS — Z79899 Other long term (current) drug therapy: Secondary | ICD-10-CM | POA: Diagnosis not present

## 2016-05-12 DIAGNOSIS — R778 Other specified abnormalities of plasma proteins: Secondary | ICD-10-CM

## 2016-05-12 DIAGNOSIS — R079 Chest pain, unspecified: Secondary | ICD-10-CM | POA: Diagnosis not present

## 2016-05-12 DIAGNOSIS — Z720 Tobacco use: Secondary | ICD-10-CM | POA: Diagnosis not present

## 2016-05-12 DIAGNOSIS — R748 Abnormal levels of other serum enzymes: Secondary | ICD-10-CM | POA: Diagnosis present

## 2016-05-12 DIAGNOSIS — R0602 Shortness of breath: Secondary | ICD-10-CM | POA: Diagnosis not present

## 2016-05-12 HISTORY — PX: LEFT HEART CATH AND CORONARY ANGIOGRAPHY: CATH118249

## 2016-05-12 HISTORY — PX: CORONARY STENT INTERVENTION: CATH118234

## 2016-05-12 HISTORY — PX: CORONARY ANGIOPLASTY WITH STENT PLACEMENT: SHX49

## 2016-05-12 LAB — COMPREHENSIVE METABOLIC PANEL
ALBUMIN: 3.5 g/dL (ref 3.5–5.0)
ALT: 19 U/L (ref 17–63)
AST: 22 U/L (ref 15–41)
Alkaline Phosphatase: 63 U/L (ref 38–126)
Anion gap: 12 (ref 5–15)
BILIRUBIN TOTAL: 0.8 mg/dL (ref 0.3–1.2)
BUN: 8 mg/dL (ref 6–20)
CHLORIDE: 98 mmol/L — AB (ref 101–111)
CO2: 28 mmol/L (ref 22–32)
Calcium: 9.5 mg/dL (ref 8.9–10.3)
Creatinine, Ser: 0.96 mg/dL (ref 0.61–1.24)
GFR calc Af Amer: >60 mL/min (ref >60)
GFR calc non Af Amer: >60 mL/min (ref >60)
GLUCOSE: 191 mg/dL — AB (ref 65–99)
Potassium: 3.9 mmol/L (ref 3.5–5.1)
Sodium: 138 mmol/L (ref 135–145)
Total Protein: 6 g/dL — ABNORMAL LOW (ref 6.5–8.1)

## 2016-05-12 LAB — GLUCOSE, CAPILLARY
GLUCOSE-CAPILLARY: 163 mg/dL — AB (ref 65–99)
Glucose-Capillary: 131 mg/dL — ABNORMAL HIGH (ref 65–99)
Glucose-Capillary: 175 mg/dL — ABNORMAL HIGH (ref 65–99)
Glucose-Capillary: 198 mg/dL — ABNORMAL HIGH (ref 65–99)
Glucose-Capillary: 201 mg/dL — ABNORMAL HIGH (ref 65–99)
Glucose-Capillary: 249 mg/dL — ABNORMAL HIGH (ref 65–99)

## 2016-05-12 LAB — CBC
HCT: 48.5 % (ref 39.0–52.0)
Hemoglobin: 16.5 g/dL (ref 13.0–17.0)
MCH: 31.4 pg (ref 26.0–34.0)
MCHC: 34 g/dL (ref 30.0–36.0)
MCV: 92.2 fL (ref 78.0–100.0)
Platelets: 183 10*3/uL (ref 150–400)
RBC: 5.26 MIL/uL (ref 4.22–5.81)
RDW: 13.3 % (ref 11.5–15.5)
WBC: 13.5 10*3/uL — ABNORMAL HIGH (ref 4.0–10.5)

## 2016-05-12 LAB — TROPONIN I
TROPONIN I: 0.06 ng/mL — AB (ref <0.03)
Troponin I: 0.08 ng/mL (ref <0.03)
Troponin I: 0.09 ng/mL (ref <0.03)

## 2016-05-12 LAB — INFLUENZA PANEL BY PCR (TYPE A & B)
Influenza A By PCR: NEGATIVE
Influenza B By PCR: NEGATIVE

## 2016-05-12 LAB — TSH: TSH: 2.3 u[IU]/mL (ref 0.350–4.500)

## 2016-05-12 LAB — PROTIME-INR
INR: 1.12
Prothrombin Time: 14.5 seconds (ref 11.4–15.2)

## 2016-05-12 LAB — HIV ANTIBODY (ROUTINE TESTING W REFLEX): HIV SCREEN 4TH GENERATION: NONREACTIVE

## 2016-05-12 LAB — BRAIN NATRIURETIC PEPTIDE: B Natriuretic Peptide: 258.2 pg/mL — ABNORMAL HIGH (ref 0.0–100.0)

## 2016-05-12 LAB — POCT ACTIVATED CLOTTING TIME: ACTIVATED CLOTTING TIME: 323 s

## 2016-05-12 LAB — HEPARIN LEVEL (UNFRACTIONATED): Heparin Unfractionated: 0.6 IU/mL (ref 0.30–0.70)

## 2016-05-12 LAB — SURGICAL PCR SCREEN
MRSA, PCR: NEGATIVE
Staphylococcus aureus: NEGATIVE

## 2016-05-12 SURGERY — LEFT HEART CATH AND CORONARY ANGIOGRAPHY

## 2016-05-12 MED ORDER — HEPARIN (PORCINE) IN NACL 100-0.45 UNIT/ML-% IJ SOLN
1400.0000 [IU]/h | INTRAMUSCULAR | Status: DC
  Administered 2016-05-12: 1400 [IU]/h via INTRAVENOUS
  Filled 2016-05-12: qty 250

## 2016-05-12 MED ORDER — NITROGLYCERIN 1 MG/10 ML FOR IR/CATH LAB
INTRA_ARTERIAL | Status: AC
  Filled 2016-05-12: qty 10

## 2016-05-12 MED ORDER — FENTANYL CITRATE (PF) 100 MCG/2ML IJ SOLN
INTRAMUSCULAR | Status: DC | PRN
  Administered 2016-05-12: 25 ug via INTRAVENOUS

## 2016-05-12 MED ORDER — TIROFIBAN HCL IN NACL 5-0.9 MG/100ML-% IV SOLN
0.1500 ug/kg/min | INTRAVENOUS | Status: AC
  Administered 2016-05-12: 0.15 ug/kg/min via INTRAVENOUS

## 2016-05-12 MED ORDER — TIROFIBAN HCL IN NACL 5-0.9 MG/100ML-% IV SOLN
INTRAVENOUS | Status: AC
  Filled 2016-05-12: qty 100

## 2016-05-12 MED ORDER — SODIUM CHLORIDE 0.9 % WEIGHT BASED INFUSION: 3.0000 mL/kg/h | INTRAVENOUS | Status: DC

## 2016-05-12 MED ORDER — ASPIRIN 81 MG PO CHEW: 81.0000 mg | CHEWABLE_TABLET | ORAL | Status: DC

## 2016-05-12 MED ORDER — HEPARIN SODIUM (PORCINE) 1000 UNIT/ML IJ SOLN
INTRAMUSCULAR | Status: AC
  Filled 2016-05-12: qty 1

## 2016-05-12 MED ORDER — CHLORTHALIDONE 25 MG PO TABS
25.0000 mg | ORAL_TABLET | Freq: Every day | ORAL | Status: DC
  Administered 2016-05-12 – 2016-05-13 (×2): 25 mg via ORAL
  Filled 2016-05-12 (×2): qty 1

## 2016-05-12 MED ORDER — ASPIRIN 81 MG PO CHEW: 81.0000 mg | CHEWABLE_TABLET | Freq: Every day | ORAL | Status: DC

## 2016-05-12 MED ORDER — HEPARIN (PORCINE) IN NACL 2-0.9 UNIT/ML-% IJ SOLN
INTRAMUSCULAR | Status: AC
  Filled 2016-05-12: qty 500

## 2016-05-12 MED ORDER — SODIUM CHLORIDE 0.9% FLUSH: 3.0000 mL | INTRAVENOUS | Status: DC | PRN

## 2016-05-12 MED ORDER — ACETAMINOPHEN 325 MG PO TABS
650.0000 mg | ORAL_TABLET | ORAL | Status: DC | PRN
  Administered 2016-05-13: 09:00:00 650 mg via ORAL
  Filled 2016-05-12: qty 2

## 2016-05-12 MED ORDER — VERAPAMIL HCL 2.5 MG/ML IV SOLN
INTRAVENOUS | Status: DC | PRN
  Administered 2016-05-12: 10 mL via INTRA_ARTERIAL

## 2016-05-12 MED ORDER — SODIUM CHLORIDE 0.9 % IV SOLN: 250.0000 mL | INTRAVENOUS | Status: DC | PRN

## 2016-05-12 MED ORDER — LABETALOL HCL 5 MG/ML IV SOLN: 10.0000 mg | INTRAVENOUS | Status: AC | PRN

## 2016-05-12 MED ORDER — LIDOCAINE HCL (PF) 1 % IJ SOLN
INTRAMUSCULAR | Status: AC
  Filled 2016-05-12: qty 30

## 2016-05-12 MED ORDER — SODIUM CHLORIDE 0.9% FLUSH: 3.0000 mL | Freq: Two times a day (BID) | INTRAVENOUS | Status: DC

## 2016-05-12 MED ORDER — ASPIRIN EC 81 MG PO TBEC
81.0000 mg | DELAYED_RELEASE_TABLET | Freq: Every day | ORAL | Status: DC
  Administered 2016-05-13: 09:00:00 81 mg via ORAL
  Filled 2016-05-12: qty 1

## 2016-05-12 MED ORDER — FENTANYL CITRATE (PF) 100 MCG/2ML IJ SOLN
INTRAMUSCULAR | Status: AC
  Filled 2016-05-12: qty 2

## 2016-05-12 MED ORDER — IOPAMIDOL (ISOVUE-370) INJECTION 76%
INTRAVENOUS | Status: AC
  Filled 2016-05-12: qty 50

## 2016-05-12 MED ORDER — ASPIRIN 81 MG PO CHEW
81.0000 mg | CHEWABLE_TABLET | ORAL | Status: AC
  Administered 2016-05-12: 81 mg via ORAL
  Filled 2016-05-12: qty 1

## 2016-05-12 MED ORDER — TIROFIBAN (AGGRASTAT) BOLUS VIA INFUSION
INTRAVENOUS | Status: DC | PRN
  Administered 2016-05-12: 3032.5 ug via INTRAVENOUS

## 2016-05-12 MED ORDER — HEPARIN (PORCINE) IN NACL 2-0.9 UNIT/ML-% IJ SOLN
INTRAMUSCULAR | Status: DC | PRN
  Administered 2016-05-12: 12:00:00

## 2016-05-12 MED ORDER — HEPARIN (PORCINE) IN NACL 100-0.45 UNIT/ML-% IJ SOLN
1800.0000 [IU]/h | INTRAMUSCULAR | Status: DC
  Administered 2016-05-12: 19:00:00 1400 [IU]/h via INTRAVENOUS
  Administered 2016-05-13: 1600 [IU]/h via INTRAVENOUS
  Filled 2016-05-12 (×2): qty 250

## 2016-05-12 MED ORDER — SODIUM CHLORIDE 0.9 % WEIGHT BASED INFUSION: 1.0000 mL/kg/h | INTRAVENOUS | Status: DC

## 2016-05-12 MED ORDER — SODIUM CHLORIDE 0.9 % WEIGHT BASED INFUSION
3.0000 mL/kg/h | INTRAVENOUS | Status: DC
  Administered 2016-05-12: 3 mL/kg/h via INTRAVENOUS

## 2016-05-12 MED ORDER — HEPARIN BOLUS VIA INFUSION
4000.0000 [IU] | Freq: Once | INTRAVENOUS | Status: AC
  Administered 2016-05-12: 4000 [IU] via INTRAVENOUS
  Filled 2016-05-12: qty 4000

## 2016-05-12 MED ORDER — SODIUM CHLORIDE 0.9 % WEIGHT BASED INFUSION
1.0000 mL/kg/h | INTRAVENOUS | Status: DC
  Administered 2016-05-12: 1 mL/kg/h via INTRAVENOUS

## 2016-05-12 MED ORDER — VERAPAMIL HCL 2.5 MG/ML IV SOLN
INTRAVENOUS | Status: AC
  Filled 2016-05-12: qty 2

## 2016-05-12 MED ORDER — NITROGLYCERIN 1 MG/10 ML FOR IR/CATH LAB
INTRA_ARTERIAL | Status: DC | PRN
  Administered 2016-05-12: 200 ug via INTRACORONARY

## 2016-05-12 MED ORDER — IOPAMIDOL (ISOVUE-370) INJECTION 76%
INTRAVENOUS | Status: AC
  Filled 2016-05-12: qty 100

## 2016-05-12 MED ORDER — ATENOLOL 50 MG PO TABS
100.0000 mg | ORAL_TABLET | Freq: Every day | ORAL | Status: DC
  Administered 2016-05-12 – 2016-05-13 (×2): 100 mg via ORAL
  Filled 2016-05-12: qty 2
  Filled 2016-05-12: qty 4

## 2016-05-12 MED ORDER — TIROFIBAN HCL IN NACL 5-0.9 MG/100ML-% IV SOLN
INTRAVENOUS | Status: DC | PRN
  Administered 2016-05-12: 13:00:00
  Administered 2016-05-12: 0.15 ug/kg/min via INTRAVENOUS

## 2016-05-12 MED ORDER — INSULIN ASPART PROT & ASPART (70-30 MIX) 100 UNIT/ML ~~LOC~~ SUSP
30.0000 [IU] | Freq: Every day | SUBCUTANEOUS | Status: DC
  Administered 2016-05-12: 21:00:00 30 [IU] via SUBCUTANEOUS
  Filled 2016-05-12 (×2): qty 10

## 2016-05-12 MED ORDER — CLOPIDOGREL BISULFATE 300 MG PO TABS
ORAL_TABLET | ORAL | Status: AC
  Filled 2016-05-12: qty 2

## 2016-05-12 MED ORDER — HEPARIN SODIUM (PORCINE) 1000 UNIT/ML IJ SOLN
INTRAMUSCULAR | Status: DC | PRN
Start: 2016-05-12 — End: 2016-05-12
  Administered 2016-05-12: 5000 [IU] via INTRAVENOUS
  Administered 2016-05-12: 6000 [IU] via INTRAVENOUS

## 2016-05-12 MED ORDER — HYDRALAZINE HCL 20 MG/ML IJ SOLN: 5.0000 mg | INTRAMUSCULAR | Status: AC | PRN

## 2016-05-12 MED ORDER — ANGIOPLASTY BOOK
Freq: Once | Status: AC
  Administered 2016-05-12: 22:00:00 1
  Filled 2016-05-12: qty 1

## 2016-05-12 MED ORDER — MIDAZOLAM HCL 2 MG/2ML IJ SOLN
INTRAMUSCULAR | Status: DC | PRN
  Administered 2016-05-12: 2 mg via INTRAVENOUS

## 2016-05-12 MED ORDER — CLOPIDOGREL BISULFATE 75 MG PO TABS
75.0000 mg | ORAL_TABLET | Freq: Every day | ORAL | Status: DC
  Administered 2016-05-13: 75 mg via ORAL
  Filled 2016-05-12: qty 1

## 2016-05-12 MED ORDER — ONDANSETRON HCL 4 MG/2ML IJ SOLN: 4.0000 mg | Freq: Four times a day (QID) | INTRAMUSCULAR | Status: DC | PRN

## 2016-05-12 MED ORDER — SODIUM CHLORIDE 0.9% FLUSH
3.0000 mL | Freq: Two times a day (BID) | INTRAVENOUS | Status: DC
  Administered 2016-05-12 – 2016-05-13 (×2): 3 mL via INTRAVENOUS

## 2016-05-12 MED ORDER — INSULIN ASPART PROT & ASPART (70-30 MIX) 100 UNIT/ML ~~LOC~~ SUSP
50.0000 [IU] | Freq: Every day | SUBCUTANEOUS | Status: DC
  Administered 2016-05-13: 50 [IU] via SUBCUTANEOUS

## 2016-05-12 MED ORDER — CLOPIDOGREL BISULFATE 300 MG PO TABS
ORAL_TABLET | ORAL | Status: DC | PRN
  Administered 2016-05-12: 600 mg via ORAL

## 2016-05-12 MED ORDER — IOPAMIDOL (ISOVUE-370) INJECTION 76%
INTRAVENOUS | Status: DC | PRN
  Administered 2016-05-12: 115 mL

## 2016-05-12 MED ORDER — LIDOCAINE HCL (PF) 1 % IJ SOLN
INTRAMUSCULAR | Status: DC | PRN
  Administered 2016-05-12: 3 mL

## 2016-05-12 MED ORDER — MIDAZOLAM HCL 2 MG/2ML IJ SOLN
INTRAMUSCULAR | Status: AC
  Filled 2016-05-12: qty 2

## 2016-05-12 SURGICAL SUPPLY — 19 items
BALLN EUPHORA RX 2.5X15 (BALLOONS) ×2
BALLN ~~LOC~~ EMERGE MR 3.0X12 (BALLOONS) ×2
BALLOON EUPHORA RX 2.5X15 (BALLOONS) IMPLANT
BALLOON ~~LOC~~ EMERGE MR 3.0X12 (BALLOONS) IMPLANT
CATH IMPULSE 5F ANG/FL3.5 (CATHETERS) ×2 IMPLANT
DEVICE RAD COMP TR BAND LRG (VASCULAR PRODUCTS) ×1 IMPLANT
GLIDESHEATH SLEND SS 6F .021 (SHEATH) ×1 IMPLANT
GUIDE CATH RUNWAY 6FR CLS3 (CATHETERS) ×1 IMPLANT
GUIDEWIRE INQWIRE 1.5J.035X260 (WIRE) IMPLANT
INQWIRE 1.5J .035X260CM (WIRE) ×2
KIT ENCORE 26 ADVANTAGE (KITS) ×1 IMPLANT
KIT HEART LEFT (KITS) ×2 IMPLANT
PACK CARDIAC CATHETERIZATION (CUSTOM PROCEDURE TRAY) ×2 IMPLANT
STENT SYNERGY DES 2.75X16 (Permanent Stent) ×1 IMPLANT
SYR MEDRAD MARK V 150ML (SYRINGE) ×2 IMPLANT
TRANSDUCER W/STOPCOCK (MISCELLANEOUS) ×2 IMPLANT
TUBING CIL FLEX 10 FLL-RA (TUBING) ×2 IMPLANT
VALVE GUARDIAN II ~~LOC~~ HEMO (MISCELLANEOUS) ×1 IMPLANT
WIRE ASAHI PROWATER 180CM (WIRE) ×1 IMPLANT

## 2016-05-12 NOTE — Progress Notes (Signed)
ANTICOAGULATION CONSULT NOTE - Initial Consult  Pharmacy Consult for Heparin Indication: atrial fibrillation  Allergies  Allergen Reactions  . Bee Venom Anaphylaxis  . Actos [Pioglitazone Hydrochloride] Swelling  . Lipitor [Atorvastatin] Other (See Comments)    Patient cannot recall reaction  . Lisinopril Other (See Comments)    cough    Patient Measurements: Height: 5\' 5"  (165.1 cm) Weight: 267 lb 6.4 oz (121.3 kg) IBW/kg (Calculated) : 61.5 Heparin Dosing Weight: 89 kg  Vital Signs: Temp: 98 F (36.7 C) (02/20 2354) Temp Source: Oral (02/20 2354) BP: 129/94 (02/20 2354) Pulse Rate: 110 (02/20 2354)  Labs:  Recent Labs  05/11/16 1840  HGB 17.2*  HCT 48.8  PLT 219  CREATININE 1.00    Estimated Creatinine Clearance: 96.1 mL/min (by C-G formula based on SCr of 1 mg/dL).   Medical History: Past Medical History:  Diagnosis Date  . Allergy to bee sting   . Arthritis   . CAD (coronary artery disease)   . HTN (hypertension)   . Hyperlipidemia   . Hypothyroidism   . Macular degeneration   . Obesity   . Type II or unspecified type diabetes mellitus without mention of complication, not stated as uncontrolled     Medications:  Prescriptions Prior to Admission  Medication Sig Dispense Refill Last Dose  . acetaminophen (TYLENOL) 650 MG CR tablet Take 650 mg by mouth every 8 (eight) hours as needed for pain.    05/11/2016 at Unknown time  . aspirin 81 MG EC tablet Take 81 mg by mouth daily.     05/11/2016 at Unknown time  . atenolol-chlorthalidone (TENORETIC) 100-25 MG tablet Take 1 tablet by mouth daily. 90 tablet 3 05/11/2016 at Unknown time  . diclofenac sodium (VOLTAREN) 1 % GEL Apply 1 application topically 4 (four) times daily. (Patient taking differently: Apply 1 application topically daily as needed (pain). ) 100 g 0 Past Month at Unknown time  . diphenhydrAMINE (BENADRYL) 25 MG tablet Take 1 tablet (25 mg total) by mouth every 6 (six) hours. (Patient taking  differently: Take 25 mg by mouth every 6 (six) hours as needed for allergies. For allergic reactions) 20 tablet 0 Past Month at Unknown time  . EPINEPHrine 0.3 mg/0.3 mL IJ SOAJ injection Inject 0.3 mLs (0.3 mg total) into the muscle once. For anaphylactic reaction 2 Device 0 prn  . HUMALOG MIX 75/25 KWIKPEN (75-25) 100 UNIT/ML Kwikpen Inject 30-40 Units into the skin 2 (two) times daily with a meal. Injects 40 units daily in morning and 30 units daily in evening. (Patient taking differently: Inject 30-50 Units into the skin 2 (two) times daily with a meal. 50 units in the morning and 30 units in the evening) 15 mL 3 05/11/2016 at Unknown time  . ibuprofen (ADVIL,MOTRIN) 200 MG tablet Take 200 mg by mouth every 8 (eight) hours as needed for moderate pain.    Past Week at Unknown time  . levothyroxine (SYNTHROID, LEVOTHROID) 175 MCG tablet Take 1 tablet (175 mcg total) by mouth daily. 90 tablet 3 05/11/2016 at Unknown time  . linagliptin (TRADJENTA) 5 MG TABS tablet Take 1 tablet (5 mg total) by mouth daily. 90 tablet 3 05/11/2016 at Unknown time  . losartan (COZAAR) 100 MG tablet TAKE 1 TABLET BY MOUTH DAILY. 90 tablet 3 05/11/2016 at Unknown time  . metFORMIN (GLUCOPHAGE) 1000 MG tablet Take 1 tablet (1,000 mg total) by mouth 2 (two) times daily with a meal. 180 tablet 3 05/11/2016 at Unknown time  .  potassium chloride (K-DUR) 10 MEQ tablet Take 1 tablet (10 mEq total) by mouth 2 (two) times daily. 180 tablet 3 05/11/2016 at Unknown time  . simvastatin (ZOCOR) 40 MG tablet Take 1 tablet (40 mg total) by mouth daily. Yearly physical due in January must see MD for refills 30 tablet 2 05/10/2016 at Unknown time    Assessment: 61 y.o. M presents with new onset afib with RVR. To begin heparin gtt. Possible cardioversion in a.m. No AC PTA. CBC ok on admission.  Goal of Therapy:  Heparin level 0.3-0.7 units/ml Monitor platelets by anticoagulation protocol: Yes   Plan:  Heparin IV bolus 4000 units Heparin gtt  at 1400 units/hr Will f/u heparin level in 6 hours Daily heparin level and CBC  Sherlon Handing, PharmD, BCPS Clinical pharmacist, pager [phone] 05/12/2016,12:01 AM

## 2016-05-12 NOTE — Progress Notes (Signed)
ANTICOAGULATION CONSULT NOTE - Follow Up Consult  Pharmacy Consult for Heparin Indication: atrial fibrillation  Allergies  Allergen Reactions  . Bee Venom Anaphylaxis  . Actos [Pioglitazone Hydrochloride] Swelling  . Lipitor [Atorvastatin] Other (See Comments)    Patient cannot recall reaction  . Lisinopril Other (See Comments)    cough    Patient Measurements: Height: 5\' 5"  (165.1 cm) Weight: 267 lb 6.4 oz (121.3 kg) IBW/kg (Calculated) : 61.5 Heparin Dosing Weight: 89kg  Vital Signs: BP: 129/67 (02/21 1625) Pulse Rate: 59 (02/21 1630)  Labs:  Recent Labs  05/11/16 1840 05/12/16 0016 05/12/16 0627  HGB 17.2*  --  16.5  HCT 48.8  --  48.5  PLT 219  --  183  LABPROT  --   --  14.5  INR  --   --  1.12  HEPARINUNFRC  --   --  0.60  CREATININE 1.00  --  0.96  TROPONINI  --  0.08* 0.09*    Estimated Creatinine Clearance: 100.1 mL/min (by C-G formula based on SCr of 0.96 mg/dL).  Assessment: 60yom s/p cath to resume heparin for afib. Radial sheath removed and TR band applied at 12:55. Previously therapeutic on 1400 units/hr.  Goal of Therapy:  Heparin level 0.3-0.7 units/ml Monitor platelets by anticoagulation protocol: Yes   Plan:  1) At 1900, resume heparin at 1400 units/hr 2) Daily heparin level and CBC 3) Follow up oral anticoagulation  Alejandra, Lem[place] 05/12/2016,4:39 PM

## 2016-05-12 NOTE — Progress Notes (Signed)
Report and care received from [place]. Patient arrived from cath lab. Patient resting comfortably and has no complaints. VSS. TR band to Rt Radial site with 13cc of air. Rt radial site WNL. Will continue to monitor patient.

## 2016-05-12 NOTE — Progress Notes (Signed)
Aggrastat D/C'd at [facility]

## 2016-05-12 NOTE — Interval H&P Note (Signed)
Cath Lab Visit (complete for each Cath Lab visit)  Clinical Evaluation Leading to the Procedure:   ACS: Yes.    Non-ACS:    Anginal Classification: CCS IV  Anti-ischemic medical therapy: Minimal Therapy (1 class of medications)  Non-Invasive Test Results: No non-invasive testing performed  Prior CABG: No previous CABG      History and Physical Interval Note:  05/12/2016 12:06 PM  Anthony Woods  has presented today for surgery, with the diagnosis of cp  The various methods of treatment have been discussed with the patient and family. After consideration of risks, benefits and other options for treatment, the patient has consented to  Procedure(s): Left Heart Cath and Coronary Angiography (N/A) as a surgical intervention .  The patient's history has been reviewed, patient examined, no change in status, stable for surgery.  I have reviewed the patient's chart and labs.  Questions were answered to the patient's satisfaction.     Larae Grooms

## 2016-05-12 NOTE — Progress Notes (Signed)
Pt's right TR Band removed and clean dry drsg applied.  Site is level 0. Good CSM.  Post TR Band instructions given to pt and he verbalized understanding of such.

## 2016-05-12 NOTE — Progress Notes (Signed)
ANTICOAGULATION CONSULT NOTE - Follow Up Consult  Pharmacy Consult for Heparin Indication: atrial fibrillation  Allergies  Allergen Reactions  . Bee Venom Anaphylaxis  . Actos [Pioglitazone Hydrochloride] Swelling  . Lipitor [Atorvastatin] Other (See Comments)    Patient cannot recall reaction  . Lisinopril Other (See Comments)    cough   Patient Measurements: Height: 5\' 5"  (165.1 cm) Weight: 267 lb 6.4 oz (121.3 kg) IBW/kg (Calculated) : 61.5 Heparin Dosing Weight: 89 kg  Vital Signs: Temp: 98.3 F (36.8 C) (02/21 0312) Temp Source: Oral (02/21 0312) BP: 104/61 (02/21 0312) Pulse Rate: 53 (02/21 0312)  Labs:  Recent Labs  05/11/16 1840 05/12/16 0016 05/12/16 0627  HGB 17.2*  --  16.5  HCT 48.8  --  48.5  PLT 219  --  183  LABPROT  --   --  14.5  INR  --   --  1.12  HEPARINUNFRC  --   --  0.60  CREATININE 1.00  --  0.96  TROPONINI  --  0.08* 0.09*   Estimated Creatinine Clearance: 100.1 mL/min (by C-G formula based on SCr of 0.96 mg/dL).  Medications:  Prescriptions Prior to Admission  Medication Sig Dispense Refill Last Dose  . acetaminophen (TYLENOL) 650 MG CR tablet Take 650 mg by mouth every 8 (eight) hours as needed for pain.    05/11/2016 at Unknown time  . aspirin 81 MG EC tablet Take 81 mg by mouth daily.     05/11/2016 at Unknown time  . atenolol-chlorthalidone (TENORETIC) 100-25 MG tablet Take 1 tablet by mouth daily. 90 tablet 3 05/11/2016 at Unknown time  . diclofenac sodium (VOLTAREN) 1 % GEL Apply 1 application topically 4 (four) times daily. (Patient taking differently: Apply 1 application topically daily as needed (pain). ) 100 g 0 Past Month at Unknown time  . diphenhydrAMINE (BENADRYL) 25 MG tablet Take 1 tablet (25 mg total) by mouth every 6 (six) hours. (Patient taking differently: Take 25 mg by mouth every 6 (six) hours as needed for allergies. For allergic reactions) 20 tablet 0 Past Month at Unknown time  . EPINEPHrine 0.3 mg/0.3 mL IJ SOAJ  injection Inject 0.3 mLs (0.3 mg total) into the muscle once. For anaphylactic reaction 2 Device 0 prn  . HUMALOG MIX 75/25 KWIKPEN (75-25) 100 UNIT/ML Kwikpen Inject 30-40 Units into the skin 2 (two) times daily with a meal. Injects 40 units daily in morning and 30 units daily in evening. (Patient taking differently: Inject 30-50 Units into the skin 2 (two) times daily with a meal. 50 units in the morning and 30 units in the evening) 15 mL 3 05/11/2016 at Unknown time  . ibuprofen (ADVIL,MOTRIN) 200 MG tablet Take 200 mg by mouth every 8 (eight) hours as needed for moderate pain.    Past Week at Unknown time  . levothyroxine (SYNTHROID, LEVOTHROID) 175 MCG tablet Take 1 tablet (175 mcg total) by mouth daily. 90 tablet 3 05/11/2016 at Unknown time  . linagliptin (TRADJENTA) 5 MG TABS tablet Take 1 tablet (5 mg total) by mouth daily. 90 tablet 3 05/11/2016 at Unknown time  . losartan (COZAAR) 100 MG tablet TAKE 1 TABLET BY MOUTH DAILY. 90 tablet 3 05/11/2016 at Unknown time  . metFORMIN (GLUCOPHAGE) 1000 MG tablet Take 1 tablet (1,000 mg total) by mouth 2 (two) times daily with a meal. 180 tablet 3 05/11/2016 at Unknown time  . potassium chloride (K-DUR) 10 MEQ tablet Take 1 tablet (10 mEq total) by mouth 2 (two) times  daily. 180 tablet 3 05/11/2016 at Unknown time  . simvastatin (ZOCOR) 40 MG tablet Take 1 tablet (40 mg total) by mouth daily. Yearly physical due in January must see MD for refills 30 tablet 2 05/10/2016 at Unknown time   Assessment: 61 y.o. M presents with new onset afib with RVR. No anticoagulation prior to admission, now on heparin drip. CBC remains stable with no bleeding or infusion related issues. Patietn NPO for possible cardioversion or nuclear study later today.  First heparin level therapeutic: 0.60  Goal of Therapy:  Heparin level 0.3-0.7 units/ml Monitor platelets by anticoagulation protocol: Yes   Plan:  Heparin gtt at 1400 units/hr Will f/u heparin level in 6 hours Daily  heparin level and CBC  Georga Bora, PharmD Clinical Pharmacist Pager: [phone] 05/12/2016 8:21 AM

## 2016-05-12 NOTE — H&P (View-Only) (Signed)
Progress Note  Patient Name: Anthony Woods Date of Encounter: 05/12/2016  Primary Cardiologist: Dr. Angelena Form   Subjective   No chest pain and no SOB, not aware he was out of a fib  Inpatient Medications    Scheduled Meds: . aspirin EC  81 mg Oral Daily  . atenolol  100 mg Oral Daily   And  . chlorthalidone  25 mg Oral Daily  . insulin aspart  0-5 Units Subcutaneous QHS  . insulin aspart  0-9 Units Subcutaneous TID WC  . insulin aspart protamine- aspart  30 Units Subcutaneous Q supper  . insulin aspart protamine- aspart  50 Units Subcutaneous Q breakfast  . levothyroxine  175 mcg Oral QAC breakfast  . simvastatin  40 mg Oral q1800  . sodium chloride flush  3 mL Intravenous Q12H   Continuous Infusions: . diltiazem (CARDIZEM) infusion Stopped (05/12/16 0210)  . heparin 1,400 Units/hr (05/12/16 0111)   PRN Meds:    Vital Signs    Vitals:   05/11/16 2331 05/11/16 2354 05/12/16 0213 05/12/16 0312  BP: 133/70 (!) 129/94 (!) 85/66 104/61  Pulse:  (!) 110 (!) 55 (!) 53  Resp:  (!) 24 (!) 22 20  Temp:  98 F (36.7 C) 98.2 F (36.8 C) 98.3 F (36.8 C)  TempSrc:  Oral Axillary Oral  SpO2:  95% 91% 93%  Weight:  267 lb 6.4 oz (121.3 kg)    Height:  5\' 5"  (1.651 m)      Intake/Output Summary (Last 24 hours) at 05/12/16 0651 Last data filed at 05/12/16 0008  Gross per 24 hour  Intake                0 ml  Output              400 ml  Net             -400 ml   Filed Weights   05/11/16 1830 05/11/16 2354  Weight: 250 lb (113.4 kg) 267 lb 6.4 oz (121.3 kg)    Telemetry    Converted to SR at 0201 with long pause and then HR to 25 for 3-4 sec. Now SR to SB - Personally Reviewed  ECG    SB at 56  - Personally Reviewed  Physical Exam   GEN: No acute distress.   Neck: No JVD Cardiac: RRR, no murmurs, rubs, or gallops.  Respiratory: Clear to auscultation bilaterally. GI: Soft, nontender, non-distended  MS: No edema; No deformity. Neuro:  Nonfocal  Psych:  Normal affect   Labs    Chemistry Recent Labs Lab 05/11/16 1840  NA 135  K 4.1  CL 97*  CO2 25  GLUCOSE 245*  BUN 9  CREATININE 1.00  CALCIUM 9.8  GFRNONAA >60  GFRAA >60  ANIONGAP 13     Hematology Recent Labs Lab 05/11/16 1840  WBC 15.6*  RBC 5.44  HGB 17.2*  HCT 48.8  MCV 89.7  MCH 31.6  MCHC 35.2  RDW 12.9  PLT 219    Cardiac Enzymes Recent Labs Lab 05/12/16 0016  TROPONINI 0.08*    Recent Labs Lab 05/11/16 1854  TROPIPOC 0.03     BNP Recent Labs Lab 05/12/16 0016  BNP 258.2*     DDimer No results for input(s): DDIMER in the last 168 hours.   Radiology    Ct Head Wo Contrast  Result Date: 05/11/2016 CLINICAL DATA:  Severe posterior headache for 3 days. History of hypertension, diabetes, hyperlipidemia.  EXAM: CT HEAD WITHOUT CONTRAST TECHNIQUE: Contiguous axial images were obtained from the base of the skull through the vertex without intravenous contrast. COMPARISON:  None. FINDINGS: BRAIN: The ventricles and sulci are mildly prominent for age. No intraparenchymal hemorrhage, mass effect nor midline shift. Patchy supratentorial white matter hypodensities. No acute large vascular territory infarcts. No abnormal extra-axial fluid collections. Basal cisterns are patent. VASCULAR: Mild calcific atherosclerosis of the carotid siphons. Mildly dense intracranial vessels associated with hemoconcentration. SKULL: No skull fracture. No significant scalp soft tissue swelling. SINUSES/ORBITS: The mastoid air-cells and included paranasal sinuses are well-aerated.The included ocular globes and orbital contents are non-suspicious. OTHER: Nonspecific bilateral facial skin thickening. IMPRESSION: No acute intracranial process. Borderline parenchymal brain volume loss for age. Moderate white matter changes compatible with chronic small vessel ischemic disease. Electronically Signed   By: Elon Alas M.D.   On: 05/11/2016 19:45   Dg Chest Port 1 View  Result  Date: 05/11/2016 CLINICAL DATA:  Headache, chest pain and shortness of breath. EXAM: PORTABLE CHEST 1 VIEW COMPARISON:  03/27/2012 FINDINGS: Cardiomediastinal silhouette is normal. Mediastinal contours appear intact. Calcific atherosclerotic disease of the aorta. There is no evidence of focal airspace consolidation, pleural effusion or pneumothorax. Osseous structures are without acute abnormality. Soft tissues are grossly normal. IMPRESSION: No evidence of acute cardiopulmonary process. Calcific atherosclerotic disease of the aorta. Electronically Signed   By: Fidela Salisbury M.D.   On: 05/11/2016 19:11    Cardiac Studies   Echo to be done  Patient Profile     61 y.o. male with hx of mild obst. CAD in 2013, HTN, IDDM, obesity tobacco abuse admitted with chest pain and headache associated with SOB, diaphoresis and nausea.  CT head neg.  He was in new a fib.     Assessment & Plan    Chest pain and headache with associated a fib.  Troponin 0.08. (CT head neg) --on IV heparin, ASA --nuc study vs. Cath with his risk factors of HTN, DM, tobacco use- pt is NPO --on BB  New atrial fib now converted to SR, (CHADS2VASC = 2; HTN and diabetes) ? anginal --on IV heparin --echo for today  HTN,   BP down to 85 systolic and dilt drip stopped  Now 104/61  DM-2  On oral agents now on SSI  Hypothyroid on synthroid and TSH 2.30  Tobacco use- discussed stopping  Signed, Cecilie Kicks, NP  05/12/2016, 6:51 AM    Pt seen and examined   I agree with findings as noted by L Ingold   Pt admitted with CP and HA  Found to be in afib  He has since converted to SR CP is gone  Pt is a VERY DIFFICULT historian  VAGUE  Has chest discomfort at home  No energy  Getting worse.   He had cath several years ago with mild dz.  Still with history and risk factors would recomm evaluation prior to committing to long term anticoagulatin when he will need  Pt understands  Agrees to proceed  Will plan for  today.  Continue statin.   Counselled on tobacco cessation.  Dorris Carnes

## 2016-05-12 NOTE — Progress Notes (Signed)
CRITICAL VALUE ALERT  Critical value received: troponin 0.08  Date of notification:  05/12/16  Time of notification:  01:14  Critical value read back:yes  Nurse who received alert:  Torria Fromer I. MD notified (1st page): yes  Time of first page:  01:18  MD notified (2nd page):  Time of second page:  Responding MD:    Time MD responded:  01:20

## 2016-05-12 NOTE — Progress Notes (Signed)
Progress Note  Patient Name: Anthony Woods Date of Encounter: 05/12/2016  Primary Cardiologist: Dr. Angelena Form   Subjective   No chest pain and no SOB, not aware he was out of a fib  Inpatient Medications    Scheduled Meds: . aspirin EC  81 mg Oral Daily  . atenolol  100 mg Oral Daily   And  . chlorthalidone  25 mg Oral Daily  . insulin aspart  0-5 Units Subcutaneous QHS  . insulin aspart  0-9 Units Subcutaneous TID WC  . insulin aspart protamine- aspart  30 Units Subcutaneous Q supper  . insulin aspart protamine- aspart  50 Units Subcutaneous Q breakfast  . levothyroxine  175 mcg Oral QAC breakfast  . simvastatin  40 mg Oral q1800  . sodium chloride flush  3 mL Intravenous Q12H   Continuous Infusions: . diltiazem (CARDIZEM) infusion Stopped (05/12/16 0210)  . heparin 1,400 Units/hr (05/12/16 0111)   PRN Meds:    Vital Signs    Vitals:   05/11/16 2331 05/11/16 2354 05/12/16 0213 05/12/16 0312  BP: 133/70 (!) 129/94 (!) 85/66 104/61  Pulse:  (!) 110 (!) 55 (!) 53  Resp:  (!) 24 (!) 22 20  Temp:  98 F (36.7 C) 98.2 F (36.8 C) 98.3 F (36.8 C)  TempSrc:  Oral Axillary Oral  SpO2:  95% 91% 93%  Weight:  267 lb 6.4 oz (121.3 kg)    Height:  5\' 5"  (1.651 m)      Intake/Output Summary (Last 24 hours) at 05/12/16 0651 Last data filed at 05/12/16 0008  Gross per 24 hour  Intake                0 ml  Output              400 ml  Net             -400 ml   Filed Weights   05/11/16 1830 05/11/16 2354  Weight: 250 lb (113.4 kg) 267 lb 6.4 oz (121.3 kg)    Telemetry    Converted to SR at 0201 with long pause and then HR to 25 for 3-4 sec. Now SR to SB - Personally Reviewed  ECG    SB at 56  - Personally Reviewed  Physical Exam   GEN: No acute distress.   Neck: No JVD Cardiac: RRR, no murmurs, rubs, or gallops.  Respiratory: Clear to auscultation bilaterally. GI: Soft, nontender, non-distended  MS: No edema; No deformity. Neuro:  Nonfocal  Psych:  Normal affect   Labs    Chemistry Recent Labs Lab 05/11/16 1840  NA 135  K 4.1  CL 97*  CO2 25  GLUCOSE 245*  BUN 9  CREATININE 1.00  CALCIUM 9.8  GFRNONAA >60  GFRAA >60  ANIONGAP 13     Hematology Recent Labs Lab 05/11/16 1840  WBC 15.6*  RBC 5.44  HGB 17.2*  HCT 48.8  MCV 89.7  MCH 31.6  MCHC 35.2  RDW 12.9  PLT 219    Cardiac Enzymes Recent Labs Lab 05/12/16 0016  TROPONINI 0.08*    Recent Labs Lab 05/11/16 1854  TROPIPOC 0.03     BNP Recent Labs Lab 05/12/16 0016  BNP 258.2*     DDimer No results for input(s): DDIMER in the last 168 hours.   Radiology    Ct Head Wo Contrast  Result Date: 05/11/2016 CLINICAL DATA:  Severe posterior headache for 3 days. History of hypertension, diabetes, hyperlipidemia.  EXAM: CT HEAD WITHOUT CONTRAST TECHNIQUE: Contiguous axial images were obtained from the base of the skull through the vertex without intravenous contrast. COMPARISON:  None. FINDINGS: BRAIN: The ventricles and sulci are mildly prominent for age. No intraparenchymal hemorrhage, mass effect nor midline shift. Patchy supratentorial white matter hypodensities. No acute large vascular territory infarcts. No abnormal extra-axial fluid collections. Basal cisterns are patent. VASCULAR: Mild calcific atherosclerosis of the carotid siphons. Mildly dense intracranial vessels associated with hemoconcentration. SKULL: No skull fracture. No significant scalp soft tissue swelling. SINUSES/ORBITS: The mastoid air-cells and included paranasal sinuses are well-aerated.The included ocular globes and orbital contents are non-suspicious. OTHER: Nonspecific bilateral facial skin thickening. IMPRESSION: No acute intracranial process. Borderline parenchymal brain volume loss for age. Moderate white matter changes compatible with chronic small vessel ischemic disease. Electronically Signed   By: Elon Alas M.D.   On: 05/11/2016 19:45   Dg Chest Port 1 View  Result  Date: 05/11/2016 CLINICAL DATA:  Headache, chest pain and shortness of breath. EXAM: PORTABLE CHEST 1 VIEW COMPARISON:  03/27/2012 FINDINGS: Cardiomediastinal silhouette is normal. Mediastinal contours appear intact. Calcific atherosclerotic disease of the aorta. There is no evidence of focal airspace consolidation, pleural effusion or pneumothorax. Osseous structures are without acute abnormality. Soft tissues are grossly normal. IMPRESSION: No evidence of acute cardiopulmonary process. Calcific atherosclerotic disease of the aorta. Electronically Signed   By: Fidela Salisbury M.D.   On: 05/11/2016 19:11    Cardiac Studies   Echo to be done  Patient Profile     61 y.o. male with hx of mild obst. CAD in 2013, HTN, IDDM, obesity tobacco abuse admitted with chest pain and headache associated with SOB, diaphoresis and nausea.  CT head neg.  He was in new a fib.     Assessment & Plan    Chest pain and headache with associated a fib.  Troponin 0.08. (CT head neg) --on IV heparin, ASA --nuc study vs. Cath with his risk factors of HTN, DM, tobacco use- pt is NPO --on BB  New atrial fib now converted to SR, (CHADS2VASC = 2; HTN and diabetes) ? anginal --on IV heparin --echo for today  HTN,   BP down to 85 systolic and dilt drip stopped  Now 104/61  DM-2  On oral agents now on SSI  Hypothyroid on synthroid and TSH 2.30  Tobacco use- discussed stopping  Signed, Cecilie Kicks, NP  05/12/2016, 6:51 AM    Pt seen and examined   I agree with findings as noted by L Ingold   Pt admitted with CP and HA  Found to be in afib  He has since converted to SR CP is gone  Pt is a VERY DIFFICULT historian  VAGUE  Has chest discomfort at home  No energy  Getting worse.   He had cath several years ago with mild dz.  Still with history and risk factors would recomm evaluation prior to committing to long term anticoagulatin when he will need  Pt understands  Agrees to proceed  Will plan for  today.  Continue statin.   Counselled on tobacco cessation.  Dorris Carnes

## 2016-05-12 NOTE — Progress Notes (Signed)
Per MD on call, stopped the IV Cardizem, get an EKG and stop the normal saline fluid.

## 2016-05-12 NOTE — Progress Notes (Signed)
Report and Care Transferred to [facility]. Patient remains comfortable.

## 2016-05-13 ENCOUNTER — Encounter (HOSPITAL_COMMUNITY): Payer: Self-pay | Admitting: Interventional Cardiology

## 2016-05-13 ENCOUNTER — Other Ambulatory Visit: Payer: Self-pay | Admitting: Internal Medicine

## 2016-05-13 ENCOUNTER — Telehealth: Payer: Self-pay | Admitting: Cardiology

## 2016-05-13 ENCOUNTER — Inpatient Hospital Stay (HOSPITAL_COMMUNITY): Payer: Commercial Managed Care - HMO

## 2016-05-13 DIAGNOSIS — Z720 Tobacco use: Secondary | ICD-10-CM

## 2016-05-13 DIAGNOSIS — R002 Palpitations: Secondary | ICD-10-CM

## 2016-05-13 LAB — BASIC METABOLIC PANEL
ANION GAP: 10 (ref 5–15)
BUN: 9 mg/dL (ref 6–20)
CALCIUM: 9.7 mg/dL (ref 8.9–10.3)
CO2: 28 mmol/L (ref 22–32)
Chloride: 100 mmol/L — ABNORMAL LOW (ref 101–111)
Creatinine, Ser: 0.96 mg/dL (ref 0.61–1.24)
GLUCOSE: 147 mg/dL — AB (ref 65–99)
POTASSIUM: 3.3 mmol/L — AB (ref 3.5–5.1)
Sodium: 138 mmol/L (ref 135–145)

## 2016-05-13 LAB — HEPARIN LEVEL (UNFRACTIONATED)
HEPARIN UNFRACTIONATED: 0.16 [IU]/mL — AB (ref 0.30–0.70)
HEPARIN UNFRACTIONATED: 0.25 [IU]/mL — AB (ref 0.30–0.70)

## 2016-05-13 LAB — GLUCOSE, CAPILLARY
GLUCOSE-CAPILLARY: 126 mg/dL — AB (ref 65–99)
Glucose-Capillary: 139 mg/dL — ABNORMAL HIGH (ref 65–99)
Glucose-Capillary: 90 mg/dL (ref 65–99)

## 2016-05-13 LAB — ECHOCARDIOGRAM COMPLETE
HEIGHTINCHES: 65 in
WEIGHTICAEL: 4232.83 [oz_av]

## 2016-05-13 LAB — CBC
HCT: 46.3 % (ref 39.0–52.0)
HEMOGLOBIN: 15.8 g/dL (ref 13.0–17.0)
MCH: 31.2 pg (ref 26.0–34.0)
MCHC: 34.1 g/dL (ref 30.0–36.0)
MCV: 91.5 fL (ref 78.0–100.0)
Platelets: 205 10*3/uL (ref 150–400)
RBC: 5.06 MIL/uL (ref 4.22–5.81)
RDW: 13.2 % (ref 11.5–15.5)
WBC: 10.4 10*3/uL (ref 4.0–10.5)

## 2016-05-13 LAB — HEMOGLOBIN A1C
Hgb A1c MFr Bld: 9.6 % — ABNORMAL HIGH (ref 4.8–5.6)
Mean Plasma Glucose: 229

## 2016-05-13 MED ORDER — PERFLUTREN LIPID MICROSPHERE
INTRAVENOUS | Status: AC
  Administered 2016-05-13: 4 mL via INTRAVENOUS
  Filled 2016-05-13: qty 10

## 2016-05-13 MED ORDER — FUROSEMIDE 10 MG/ML IJ SOLN
40.0000 mg | Freq: Once | INTRAMUSCULAR | Status: AC
  Administered 2016-05-13: 40 mg via INTRAVENOUS
  Filled 2016-05-13: qty 4

## 2016-05-13 MED ORDER — RIVAROXABAN (XARELTO) EDUCATION KIT FOR AFIB PATIENTS
PACK | Freq: Once | Status: AC
  Administered 2016-05-13: 12:00:00
  Filled 2016-05-13: qty 1

## 2016-05-13 MED ORDER — CLOPIDOGREL BISULFATE 75 MG PO TABS: 75.0000 mg | ORAL_TABLET | Freq: Every day | ORAL | 11 refills | Status: DC

## 2016-05-13 MED ORDER — PERFLUTREN LIPID MICROSPHERE
1.0000 mL | INTRAVENOUS | Status: AC | PRN
  Administered 2016-05-13: 4 mL via INTRAVENOUS
  Filled 2016-05-13: qty 10

## 2016-05-13 MED ORDER — RIVAROXABAN 20 MG PO TABS
20.0000 mg | ORAL_TABLET | Freq: Every day | ORAL | 0 refills | Status: DC
Start: 2016-05-13 — End: 2016-05-13

## 2016-05-13 MED ORDER — POTASSIUM CHLORIDE CRYS ER 20 MEQ PO TBCR
20.0000 meq | EXTENDED_RELEASE_TABLET | Freq: Every day | ORAL | Status: DC
  Administered 2016-05-13: 12:00:00 20 meq via ORAL
  Filled 2016-05-13: qty 1

## 2016-05-13 MED ORDER — RIVAROXABAN 20 MG PO TABS
20.0000 mg | ORAL_TABLET | Freq: Every day | ORAL | Status: DC
  Administered 2016-05-13: 20 mg via ORAL
  Filled 2016-05-13: qty 1

## 2016-05-13 MED ORDER — NITROGLYCERIN 0.4 MG SL SUBL: 0.4000 mg | SUBLINGUAL_TABLET | SUBLINGUAL | 3 refills | Status: DC | PRN

## 2016-05-13 MED ORDER — RIVAROXABAN 20 MG PO TABS: 20.0000 mg | ORAL_TABLET | Freq: Every day | ORAL | 11 refills | Status: DC

## 2016-05-13 MED ORDER — POTASSIUM CHLORIDE CRYS ER 20 MEQ PO TBCR
40.0000 meq | EXTENDED_RELEASE_TABLET | Freq: Once | ORAL | Status: AC
  Administered 2016-05-13: 40 meq via ORAL
  Filled 2016-05-13: qty 2

## 2016-05-13 NOTE — Progress Notes (Signed)
  Echocardiogram 2D Echocardiogram with Definity has been performed.  Darlina Sicilian M 05/13/2016, 11:31 AM

## 2016-05-13 NOTE — Care Management Note (Signed)
Case Management Note  Patient Details  Name: Anthony Woods MRN: [id number] Date of Birth: 31-Aug-1955  Subjective/Objective:  S/p coronary stent intervention, will be on plavix, NCM will cont to follow for dc needs.                  Action/Plan:   Expected Discharge Date:                  Expected Discharge Plan:  Home/Self Care  In-House Referral:     Discharge planning Services  CM Consult  Post Acute Care Choice:    Choice offered to:     DME Arranged:    DME Agency:     HH Arranged:    HH Agency:     Status of Service:  In process, will continue to follow  If discussed at Long Length of Stay Meetings, dates discussed:    Additional Comments:  Zenon Mayo, RN 05/13/2016, 9:26 AM

## 2016-05-13 NOTE — Discharge Summary (Signed)
Discharge Summary    Patient ID: Anthony Woods,  MRN: [id number], DOB/AGE: Jul 31, 1955 61 y.o.  Admit date: 05/11/2016 Discharge date: 05/13/2016  Primary Care Provider: Walker Kehr Primary Cardiologist: Dr Angelena Form  Discharge Diagnoses    Active Problems:   Hypothyroidism   Type II diabetes mellitus, uncontrolled ([place])   TOBACCO USER   Essential hypertension   New onset atrial fibrillation (HCC)   Elevated troponin   Atrial fibrillation with RVR (HCC)   Allergies Allergies  Allergen Reactions  . Bee Venom Anaphylaxis  . Actos [Pioglitazone Hydrochloride] Swelling  . Lipitor [Atorvastatin] Other (See Comments)    Patient cannot recall reaction  . Lisinopril Other (See Comments)    cough    Diagnostic Studies/Procedures    CATH: 05/12/2016  Mid RCA lesion, 30 %stenosed.  Mid LAD lesion, 20 %stenosed.  Mid Cx lesion, 75 %stenosed. A STENT SYNERGY DES 2.75X16 drug eluting stent was successfully placed, postdilated to 3.1 mm.  Post intervention, there is a 0% residual stenosis.  The left ventricular systolic function is normal.  LV end diastolic pressure is moderately elevated at 21.  The left ventricular ejection fraction is 50-55% by visual estimate.  There is no aortic valve stenosis.  Continue dual antiplatelet therapy for now. He may be started on anticoagulation. If this is the case, will stop aspirin and continue his anticoagulant plus clopidogrel 75 mg daily. This is why we chose clopidogrel in this case. Also, a Synergy drug-eluting stent was used in the event that antiplatelet therapy needs to be stopped sooner than 1 year. Overall, he needs aggressive secondary prevention including weight loss, diabetes control and avoidance of tobacco.  Postprocedure fluids minimized due to elevated LVEDP.  ECHO: 05/13/2016 - Left ventricle: The cavity size was mildly dilated. There was   mild concentric hypertrophy. Systolic function was normal. The  estimated ejection fraction was in the range of 50% to 55%. Wall   motion was normal; there were no regional wall motion   abnormalities. Features are consistent with a pseudonormal left   ventricular filling pattern, with concomitant abnormal relaxation   and increased filling pressure (grade 2 diastolic dysfunction). - Aortic valve: Transvalvular velocity was within the normal range.   There was no stenosis. There was no regurgitation. - Mitral valve: Transvalvular velocity was within the normal range.   There was no evidence for stenosis. There was no regurgitation. - Left atrium: The atrium was moderately dilated. - Right ventricle: The cavity size was normal. Wall thickness was   normal. Systolic function was normal. - Tricuspid valve: There was trivial regurgitation.  _____________   History of Present Illness     61 y.o. male with hx of mild obst. CAD in 2013, HTN, IDDM, obesity, tobacco abuse admitted with chest pain and headache associated with SOB, diaphoresis and nausea.  CT head neg.  He was in new a fib. CHA2DS2VASc=3 (HTN, DM, CAD)  Hospital Course     Consultants: none   Pt was placed on heparin and a Cardizem gtt with improvement in his rate.  WBC elevated but pt was negative for flu.   His BP dropped to [id number] systolic and the Cardizem gtt was d/c'd. He converted spontaneously to SR with a pause and brief bradycardia. Once he converted to SR, his chest pain resolved.   His troponin's were mildly elevated with flat trend. However, because of his CP in the setting of elevated HR, and poorly controlled CRFs, cardiac cath was recommended. This  was performed on 02/21. He had significant stenosis in his CFX, treated w/ Synergy DES. He tolerated the procedure well.   With a CHA2DS2VASc of 3, anticoagulation is indicated. He was started on Xarelto after the cath.   His A1c was checked and was elevated at 9.6. Compliance with a DM diet and meds is recommended, f/u with PCP and Dr  Chalmers Cater.   His LVEDP was moderately elevated (21) at the cath.   ON 02/22, he was given seen by Dr Harrington Challenger and all data were reviewed. He was given IV Lasix on to improve his oxygenation as his LVEDP was elevated and his weight was trending up. HIs potassium was slightly low and was supplemented. He was ambulating without chest pain or SOB and considered stable for discharge, to follow up as an outpatient.  _____________  Discharge Vitals Blood pressure 125/66, pulse 61, temperature 97.1 F (36.2 C), temperature source Oral, resp. rate 19, height 5\' 5"  (1.651 m), weight 264 lb 8.8 oz (120 kg), SpO2 98 %.  Filed Weights   05/11/16 1830 05/11/16 2354 05/13/16 0230  Weight: 250 lb (113.4 kg) 267 lb 6.4 oz (121.3 kg) 264 lb 8.8 oz (120 kg)    Labs & Radiologic Studies    CBC  Recent Labs  05/12/16 0627 05/13/16 0137  WBC 13.5* 10.4  HGB 16.5 15.8  HCT 48.5 46.3  MCV 92.2 91.5  PLT 183 [id number]   Basic Metabolic Panel  Recent Labs  05/12/16 0627 05/13/16 0137  NA 138 138  K 3.9 3.3*  CL 98* 100*  CO2 28 28  GLUCOSE 191* 147*  BUN 8 9  CREATININE 0.96 0.96  CALCIUM 9.5 9.7   Liver Function Tests  Recent Labs  05/12/16 0627  AST 22  ALT 19  ALKPHOS 63  BILITOT 0.8  PROT 6.0*  ALBUMIN 3.5   Cardiac Enzymes  Recent Labs  05/12/16 0016 05/12/16 0627 05/12/16 1805  TROPONINI 0.08* 0.09* 0.06*   Hemoglobin A1C  Recent Labs  05/12/16 0016  HGBA1C 9.6*   Thyroid Function Tests  Recent Labs  05/12/16 0016  TSH 2.300   _____________  Ct Head Wo Contrast Result Date: 05/11/2016 CLINICAL DATA:  Severe posterior headache for 3 days. History of hypertension, diabetes, hyperlipidemia. EXAM: CT HEAD WITHOUT CONTRAST TECHNIQUE: Contiguous axial images were obtained from the base of the skull through the vertex without intravenous contrast. COMPARISON:  None. FINDINGS: BRAIN: The ventricles and sulci are mildly prominent for age. No intraparenchymal hemorrhage, mass  effect nor midline shift. Patchy supratentorial white matter hypodensities. No acute large vascular territory infarcts. No abnormal extra-axial fluid collections. Basal cisterns are patent. VASCULAR: Mild calcific atherosclerosis of the carotid siphons. Mildly dense intracranial vessels associated with hemoconcentration. SKULL: No skull fracture. No significant scalp soft tissue swelling. SINUSES/ORBITS: The mastoid air-cells and included paranasal sinuses are well-aerated.The included ocular globes and orbital contents are non-suspicious. OTHER: Nonspecific bilateral facial skin thickening. IMPRESSION: No acute intracranial process. Borderline parenchymal brain volume loss for age. Moderate white matter changes compatible with chronic small vessel ischemic disease. Electronically Signed   By: Elon Alas M.D.   On: 05/11/2016 19:45   Dg Chest Port 1 View Result Date: 05/11/2016 CLINICAL DATA:  Headache, chest pain and shortness of breath. EXAM: PORTABLE CHEST 1 VIEW COMPARISON:  03/27/2012 FINDINGS: Cardiomediastinal silhouette is normal. Mediastinal contours appear intact. Calcific atherosclerotic disease of the aorta. There is no evidence of focal airspace consolidation, pleural effusion or pneumothorax. Osseous structures are without  acute abnormality. Soft tissues are grossly normal. IMPRESSION: No evidence of acute cardiopulmonary process. Calcific atherosclerotic disease of the aorta. Electronically Signed   By: Fidela Salisbury M.D.   On: 05/11/2016 19:11   Disposition   Pt is being discharged home today in good condition.  Follow-up Plans & Appointments    Follow-up Information    Lyda Jester, PA-C Follow up on 05/21/2016.   Specialties:  Cardiology, Radiology Why:  See provider at 11:00 am, please arrive 15 minutes early for paperwork.  Contact information: [address] [place] [address] [phone]          Discharge Instructions    Amb Referral to  Cardiac Rehabilitation    Complete by:  As directed    Diagnosis:  Coronary Stents      Discharge Medications   Current Discharge Medication List    START taking these medications   Details  clopidogrel (PLAVIX) 75 MG tablet Take 1 tablet (75 mg total) by mouth daily with breakfast. Qty: 30 tablet, Refills: 11    nitroGLYCERIN (NITROSTAT) 0.4 MG SL tablet Place 1 tablet (0.4 mg total) under the tongue every 5 (five) minutes as needed for chest pain. Qty: 25 tablet, Refills: 3    rivaroxaban (XARELTO) 20 MG TABS tablet Take 1 tablet (20 mg total) by mouth daily. Qty: 30 tablet, Refills: 11      CONTINUE these medications which have NOT CHANGED   Details  acetaminophen (TYLENOL) 650 MG CR tablet Take 650 mg by mouth every 8 (eight) hours as needed for pain.     aspirin 81 MG EC tablet Take 81 mg by mouth daily.      atenolol-chlorthalidone (TENORETIC) 100-25 MG tablet Take 1 tablet by mouth daily. Qty: 90 tablet, Refills: 3    diclofenac sodium (VOLTAREN) 1 % GEL Apply 1 application topically 4 (four) times daily. Qty: 100 g, Refills: 0   Associated Diagnoses: Right elbow pain    diphenhydrAMINE (BENADRYL) 25 MG tablet Take 1 tablet (25 mg total) by mouth every 6 (six) hours. Qty: 20 tablet, Refills: 0    EPINEPHrine 0.3 mg/0.3 mL IJ SOAJ injection Inject 0.3 mLs (0.3 mg total) into the muscle once. For anaphylactic reaction Qty: 2 Device, Refills: 0    HUMALOG MIX 75/25 KWIKPEN (75-25) 100 UNIT/ML Kwikpen Inject 30-40 Units into the skin 2 (two) times daily with a meal. Injects 40 units daily in morning and 30 units daily in evening. Qty: 15 mL, Refills: 3    levothyroxine (SYNTHROID, LEVOTHROID) 175 MCG tablet Take 1 tablet (175 mcg total) by mouth daily. Qty: 90 tablet, Refills: 3    linagliptin (TRADJENTA) 5 MG TABS tablet Take 1 tablet (5 mg total) by mouth daily. Qty: 90 tablet, Refills: 3   Associated Diagnoses: Uncontrolled type 2 diabetes mellitus without  complication, with long-term current use of insulin (HCC)    losartan (COZAAR) 100 MG tablet TAKE 1 TABLET BY MOUTH DAILY. Qty: 90 tablet, Refills: 3    metFORMIN (GLUCOPHAGE) 1000 MG tablet Take 1 tablet (1,000 mg total) by mouth 2 (two) times daily with a meal. Qty: 180 tablet, Refills: 3   Associated Diagnoses: Uncontrolled type 2 diabetes mellitus without complication, with long-term current use of insulin (HCC)    potassium chloride (K-DUR) 10 MEQ tablet Take 1 tablet (10 mEq total) by mouth 2 (two) times daily. Qty: 180 tablet, Refills: 3    simvastatin (ZOCOR) 40 MG tablet Take 1 tablet (40 mg total)  by mouth daily. Yearly physical due in January must see MD for refills Qty: 30 tablet, Refills: 2      STOP taking these medications     ibuprofen (ADVIL,MOTRIN) 200 MG tablet           Outstanding Labs/Studies    Duration of Discharge Encounter   Greater than 30 minutes including physician time.  Jonetta Speak NP 05/13/2016, 2:44 PM

## 2016-05-13 NOTE — Progress Notes (Signed)
Discharge instructions reviewed with teach back instruction. All questions answered.  PIV's removed. Patient ready for discharge.

## 2016-05-13 NOTE — Progress Notes (Signed)
ANTICOAGULATION CONSULT NOTE - Follow Up Consult  Pharmacy Consult for Heparin Indication: atrial fibrillation  Allergies  Allergen Reactions  . Bee Venom Anaphylaxis  . Actos [Pioglitazone Hydrochloride] Swelling  . Lipitor [Atorvastatin] Other (See Comments)    Patient cannot recall reaction  . Lisinopril Other (See Comments)    cough    Patient Measurements: Height: 5\' 5"  (165.1 cm) Weight: 264 lb 8.8 oz (120 kg) IBW/kg (Calculated) : 61.5 Heparin Dosing Weight: 89kg  Vital Signs: Temp: 97.7 F (36.5 C) (02/22 0700) Temp Source: Oral (02/22 0700) BP: 153/84 (02/22 0700) Pulse Rate: 68 (02/22 0700)  Labs:  Recent Labs  05/11/16 1840 05/12/16 0016 05/12/16 0627 05/12/16 1805 05/13/16 0137 05/13/16 0832  HGB 17.2*  --  16.5  --  15.8  --   HCT 48.8  --  48.5  --  46.3  --   PLT 219  --  183  --  205  --   LABPROT  --   --  14.5  --   --   --   INR  --   --  1.12  --   --   --   HEPARINUNFRC  --   --  0.60  --  0.16* 0.25*  CREATININE 1.00  --  0.96  --  0.96  --   TROPONINI  --  0.08* 0.09* 0.06*  --   --     Estimated Creatinine Clearance: 99.5 mL/min (by C-G formula based on SCr of 0.96 mg/dL).  Assessment: 60yom with new onset Afib, s/p cath and heparin restarted. Heparin level subtherapeutic (0.2) on gtt at 1600 units/hr. No issues with line or bleeding reported per RN.  Goal of Therapy:  Heparin level 0.3-0.7 units/ml Monitor platelets by anticoagulation protocol: Yes   Plan:  Increase heparin to 1800 units/hr Will f/u 6 hr heparin level F/u plans for oral anticoagulation  Maryanna Shape, PharmD, BCPS  Clinical Pharmacist  Pager: [phone]   05/13/2016,10:30 AM

## 2016-05-13 NOTE — Progress Notes (Signed)
ANTICOAGULATION CONSULT NOTE - Follow Up Consult  Pharmacy Consult for Heparin Indication: atrial fibrillation  Allergies  Allergen Reactions  . Bee Venom Anaphylaxis  . Actos [Pioglitazone Hydrochloride] Swelling  . Lipitor [Atorvastatin] Other (See Comments)    Patient cannot recall reaction  . Lisinopril Other (See Comments)    cough    Patient Measurements: Height: 5\' 5"  (165.1 cm) Weight: 267 lb 6.4 oz (121.3 kg) IBW/kg (Calculated) : 61.5 Heparin Dosing Weight: 89kg  Vital Signs: Temp: 98.1 F (36.7 C) (02/22 0230) Temp Source: Oral (02/22 0230) BP: 144/67 (02/22 0230) Pulse Rate: 67 (02/22 0230)  Labs:  Recent Labs  05/11/16 1840 05/12/16 0016 05/12/16 0627 05/12/16 1805 05/13/16 0137  HGB 17.2*  --  16.5  --  15.8  HCT 48.8  --  48.5  --  46.3  PLT 219  --  183  --  205  LABPROT  --   --  14.5  --   --   INR  --   --  1.12  --   --   HEPARINUNFRC  --   --  0.60  --  0.16*  CREATININE 1.00  --  0.96  --  0.96  TROPONINI  --  0.08* 0.09* 0.06*  --     Estimated Creatinine Clearance: 100.1 mL/min (by C-G formula based on SCr of 0.96 mg/dL).  Assessment: 60yom s/p cath to resume heparin for afib. Heparin level subtherapeutic (0.16) on gtt at 1400 units/hr. No issues with line or bleeding reported per RN.  Goal of Therapy:  Heparin level 0.3-0.7 units/ml Monitor platelets by anticoagulation protocol: Yes   Plan:  Increase heparin to 1600 units/hr Will f/u 6 hr heparin level  Sherlon Handing, PharmD, BCPS Clinical pharmacist, pager [phone] 05/13/2016,3:01 AM

## 2016-05-13 NOTE — Telephone Encounter (Signed)
New message      TOC appt on 05-21-16 with Brittiany per Suanne Marker

## 2016-05-13 NOTE — Progress Notes (Signed)
Progress Note  Patient Name: Anthony Woods Date of Encounter: 05/13/2016  Primary Cardiologist: New  Subjective   Denies CP  No SOB    Inpatient Medications    Scheduled Meds: . aspirin EC  81 mg Oral Daily  . atenolol  100 mg Oral Daily   And  . chlorthalidone  25 mg Oral Daily  . clopidogrel  75 mg Oral Q breakfast  . insulin aspart  0-5 Units Subcutaneous QHS  . insulin aspart  0-9 Units Subcutaneous TID WC  . insulin aspart protamine- aspart  30 Units Subcutaneous Q supper  . insulin aspart protamine- aspart  50 Units Subcutaneous Q breakfast  . levothyroxine  175 mcg Oral QAC breakfast  . simvastatin  40 mg Oral q1800  . sodium chloride flush  3 mL Intravenous Q12H  . sodium chloride flush  3 mL Intravenous Q12H   Continuous Infusions: . heparin 1,600 Units/hr (05/13/16 0845)   PRN Meds: sodium chloride, acetaminophen, ondansetron (ZOFRAN) IV, sodium chloride flush   Vital Signs    Vitals:   05/12/16 2150 05/13/16 0230 05/13/16 0640 05/13/16 0700  BP: 131/63 (!) 144/67 (!) 147/81 (!) 153/84  Pulse: 67 67 65 68  Resp: 17 19 17 18   Temp:  98.1 F (36.7 C)  97.7 F (36.5 C)  TempSrc:  Oral  Oral  SpO2: 92% 96% 93% 94%  Weight:  264 lb 8.8 oz (120 kg)    Height:        Intake/Output Summary (Last 24 hours) at 05/13/16 0936 Last data filed at 05/13/16 0800  Gross per 24 hour  Intake          1427.23 ml  Output             2425 ml  Net          -997.77 ml   Filed Weights   05/11/16 1830 05/11/16 2354 05/13/16 0230  Weight: 250 lb (113.4 kg) 267 lb 6.4 oz (121.3 kg) 264 lb 8.8 oz (120 kg)    Telemetry    SR now    ECG    =  Physical Exam  Morbidly obse 61 yo   GEN: No acute distress.   Neck: No JVD Cardiac: RRR, no murmurs, rubs, or gallops.  Respiratory: Clear to auscultation bilaterally. GI: Soft, nontender, non-distended  MS: No edema; No deformity. Neuro:  Nonfocal  Psych: Normal affect   Labs    Chemistry Recent Labs Lab  05/11/16 1840 05/12/16 0627 05/13/16 0137  NA 135 138 138  K 4.1 3.9 3.3*  CL 97* 98* 100*  CO2 25 28 28   GLUCOSE 245* 191* 147*  BUN 9 8 9   CREATININE 1.00 0.96 0.96  CALCIUM 9.8 9.5 9.7  PROT  --  6.0*  --   ALBUMIN  --  3.5  --   AST  --  22  --   ALT  --  19  --   ALKPHOS  --  63  --   BILITOT  --  0.8  --   GFRNONAA >60 >60 >60  GFRAA >60 >60 >60  ANIONGAP 13 12 10      Hematology Recent Labs Lab 05/11/16 1840 05/12/16 0627 05/13/16 0137  WBC 15.6* 13.5* 10.4  RBC 5.44 5.26 5.06  HGB 17.2* 16.5 15.8  HCT 48.8 48.5 46.3  MCV 89.7 92.2 91.5  MCH 31.6 31.4 31.2  MCHC 35.2 34.0 34.1  RDW 12.9 13.3 13.2  PLT 219 183 205  Cardiac Enzymes Recent Labs Lab 05/12/16 0016 05/12/16 0627 05/12/16 1805  TROPONINI 0.08* 0.09* 0.06*    Recent Labs Lab 05/11/16 1854  TROPIPOC 0.03     BNP Recent Labs Lab 05/12/16 0016  BNP 258.2*     DDimer No results for input(s): DDIMER in the last 168 hours.   Radiology    Ct Head Wo Contrast  Result Date: 05/11/2016 CLINICAL DATA:  Severe posterior headache for 3 days. History of hypertension, diabetes, hyperlipidemia. EXAM: CT HEAD WITHOUT CONTRAST TECHNIQUE: Contiguous axial images were obtained from the base of the skull through the vertex without intravenous contrast. COMPARISON:  None. FINDINGS: BRAIN: The ventricles and sulci are mildly prominent for age. No intraparenchymal hemorrhage, mass effect nor midline shift. Patchy supratentorial white matter hypodensities. No acute large vascular territory infarcts. No abnormal extra-axial fluid collections. Basal cisterns are patent. VASCULAR: Mild calcific atherosclerosis of the carotid siphons. Mildly dense intracranial vessels associated with hemoconcentration. SKULL: No skull fracture. No significant scalp soft tissue swelling. SINUSES/ORBITS: The mastoid air-cells and included paranasal sinuses are well-aerated.The included ocular globes and orbital contents are  non-suspicious. OTHER: Nonspecific bilateral facial skin thickening. IMPRESSION: No acute intracranial process. Borderline parenchymal brain volume loss for age. Moderate white matter changes compatible with chronic small vessel ischemic disease. Electronically Signed   By: Elon Alas M.D.   On: 05/11/2016 19:45   Dg Chest Port 1 View  Result Date: 05/11/2016 CLINICAL DATA:  Headache, chest pain and shortness of breath. EXAM: PORTABLE CHEST 1 VIEW COMPARISON:  03/27/2012 FINDINGS: Cardiomediastinal silhouette is normal. Mediastinal contours appear intact. Calcific atherosclerotic disease of the aorta. There is no evidence of focal airspace consolidation, pleural effusion or pneumothorax. Osseous structures are without acute abnormality. Soft tissues are grossly normal. IMPRESSION: No evidence of acute cardiopulmonary process. Calcific atherosclerotic disease of the aorta. Electronically Signed   By: Fidela Salisbury M.D.   On: 05/11/2016 19:11    Cardiac Studies   Left Heart Cath and Coronary Angiography  Conclusion     Mid RCA lesion, 30 %stenosed.  Mid LAD lesion, 20 %stenosed.  Mid Cx lesion, 75 %stenosed. A STENT SYNERGY DES 2.75X16 drug eluting stent was successfully placed, postdilated to 3.1 mm.  Post intervention, there is a 0% residual stenosis.  The left ventricular systolic function is normal.  LV end diastolic pressure is moderately elevated.  The left ventricular ejection fraction is 50-55% by visual estimate.  There is no aortic valve stenosis.   Continue dual antiplatelet therapy for now. He may be started on anticoagulation. If this is the case, will stop aspirin and continue his anticoagulant plus clopidogrel 75 mg daily. This is why we chose clopidogrel in this case. Also, a Synergy drug-eluting stent was used in the event that antiplatelet therapy needs to be stopped sooner than 1 year.  Overall, he needs aggressive secondary prevention including weight  loss, diabetes control and avoidance of tobacco.  Postprocedure fluids minimized due to elevated LVEDP.  (25 mm HG        Patient Profile       Assessment & Plan    1  CAD   Intervention as noted  On ASA and plavix  Now with heparin  Will place on Plavix only given Xarelto use    Give lasix x1 today with elevated LVEDP    Replete KCL    2  PAF  Pt needs to be on anticoagulatne  Would recomm Xarelto 20 mg  Daily  Echo today  3  DM  Now follows with B Balan  Glu is in 200s usually   Improving    4  Tobacco  Conselled on cessation.  Anticipate D/Cpossibly later today    Signed, Dorris Carnes, MD  05/13/2016, 9:36 AM

## 2016-05-13 NOTE — Discharge Instructions (Addendum)
PLEASE REMEMBER TO BRING ALL OF YOUR MEDICATIONS TO EACH OF YOUR FOLLOW-UP OFFICE VISITS. ° °PLEASE ATTEND ALL SCHEDULED FOLLOW-UP APPOINTMENTS.  ° °Activity: Increase activity slowly as tolerated. You may shower, but no soaking baths (or swimming) for 1 week. No driving for 2 days. No lifting over 5 lbs for 1 week. No sexual activity for 1 week.  ° °You May Return to Work: in 1 week (if applicable) ° °Wound Care: You may wash cath site gently with soap and water. Keep cath site clean and dry. If you notice pain, swelling, bleeding or pus at your cath site, please call [phone]. ° ° ° °Cardiac Cath Site Care °Refer to this sheet in the next few weeks. These instructions provide you with information on caring for yourself after your procedure. Your caregiver may also give you more specific instructions. Your treatment has been planned according to current medical practices, but problems sometimes occur. Call your caregiver if you have any problems or questions after your procedure. °HOME CARE INSTRUCTIONS °· You may shower 24 hours after the procedure. Remove the bandage (dressing) and gently wash the site with plain soap and water. Gently pat the site dry.  °· Do not apply powder or lotion to the site.  °· Do not sit in a bathtub, swimming pool, or whirlpool for 5 to 7 days.  °· No bending, squatting, or lifting anything over 10 pounds (4.5 kg) as directed by your caregiver.  °· Inspect the site at least twice daily.  °· Do not drive home if you are discharged the same day of the procedure. Have someone else drive you.  °· You may drive 24 hours after the procedure unless otherwise instructed by your caregiver.  °What to expect: °· Any bruising will usually fade within 1 to 2 weeks.  °· Blood that collects in the tissue (hematoma) may be painful to the touch. It should usually decrease in size and tenderness within 1 to 2 weeks.  °SEEK IMMEDIATE MEDICAL CARE IF: °· You have unusual pain at the site or down the  affected limb.  °· You have redness, warmth, swelling, or pain at the site.  °· You have drainage (other than a small amount of blood on the dressing).  °· You have chills.  °· You have a fever or persistent symptoms for more than 72 hours.  °· You have a fever and your symptoms suddenly get worse.  °· Your leg becomes pale, cool, tingly, or numb.  °· You have heavy bleeding from the site. Hold pressure on the site.  °Document Released: 04/10/2010 Document Revised: 02/25/2011 Document Reviewed: 04/10/2010 °ExitCare® Patient Information ©2012 [employer]. ° ° °**Information on my medicine - XARELTO® (Rivaroxaban)** ° °This medication education was reviewed with me or my healthcare representative as part of my discharge preparation.   ° °Why was Xarelto® prescribed for you? °Xarelto® was prescribed for you to reduce the risk of a blood clot forming that can cause a stroke if you have a medical condition called atrial fibrillation (a type of irregular heartbeat). ° °What do you need to know about xarelto® ? °Take your Xarelto® ONCE DAILY at the same time every day with your evening meal. °If you have difficulty swallowing the tablet whole, you may crush it and mix in applesauce just prior to taking your dose. ° °Take Xarelto® exactly as prescribed by your doctor and DO NOT stop taking Xarelto® without talking to the doctor who prescribed the medication.  Stopping without other   stroke prevention medication to take the place of Xarelto® may increase your risk of developing a clot that causes a stroke.  Refill your prescription before you run out. ° °After discharge, you should have regular check-up appointments with your healthcare provider that is prescribing your Xarelto®.  In the future your dose may need to be changed if your kidney function or weight changes by a significant amount. ° °What do you do if you miss a dose? °If you are taking Xarelto® ONCE DAILY and you miss a dose, take it as soon as you remember on  the same day then continue your regularly scheduled once daily regimen the next day. Do not take two doses of Xarelto® at the same time or on the same day.  ° °Important Safety Information °A possible side effect of Xarelto® is bleeding. You should call your healthcare provider right away if you experience any of the following: °? Bleeding from an injury or your nose that does not stop. °? Unusual colored urine (red or dark brown) or unusual colored stools (red or black). °? Unusual bruising for unknown reasons. °? A serious fall or if you hit your head (even if there is no bleeding). ° °Some medicines may interact with Xarelto® and might increase your risk of bleeding while on Xarelto®. To help avoid this, consult your healthcare provider or pharmacist prior to using any new prescription or non-prescription medications, including herbals, vitamins, non-steroidal anti-inflammatory drugs (NSAIDs) and supplements. ° °This website has more information on Xarelto®: www.xarelto.com. ° °

## 2016-05-13 NOTE — Progress Notes (Signed)
CARDIAC REHAB PHASE I   PRE:  Rate/Rhythm: 77 SR  BP:  Supine:   Sitting: 144/76  Standing:    SaO2: 95%RA  MODE:  Ambulation: 500 ft   POST:  Rate/Rhythm: 86 SR  BP:  Supine:   Sitting: 166/80  Standing:    SaO2: 98%RA 0905-1020 Pt walked 500 ft on RA with steady gait. Wheezing. He stated was not unusual for him to wheeze with activity. No CP. Tolerated well. Remained in NSR. Education completed with pt and wife who voiced understanding. Stressed importance of plavix with stent. Discussed atrial fib and gave OFF the BEAT booklet. Discussed watching carbs and heart healthy eating. Gave diabetic diet and heart healthy diets. Discussed walking for ex. Reviewed NTG use and risk factors. Gave smoking cessation handout and pt declined fake cigarette. Wife stated he has taken chantix and ? welbutrin before and became easily stressed and quick to bark. Doubtful pt will be able to quit. Discussed CRP 2 and will refer to [facility] program.   Graylon Good, RN BSN  05/13/2016 10:15 AM

## 2016-05-13 NOTE — Plan of Care (Signed)
Problem: Safety: Goal: Ability to remain free from injury will improve Outcome: Progressing Pt will remain free from falls during my shift

## 2016-05-14 NOTE — Telephone Encounter (Signed)
Patient contacted regarding discharge from [facility] on 05/13/16  Patient understands to follow up with provider simmons on 05/21/16 at 11 am at [address]. Patient understands discharge instructions? yes Patient understands medications and regiment? yes Patient understands to bring all medications to this visit? yes

## 2016-05-17 ENCOUNTER — Emergency Department (HOSPITAL_COMMUNITY)
Admission: EM | Admit: 2016-05-17 | Discharge: 2016-05-17 | Disposition: A | Discharge status: Home / Self Care | Payer: Commercial Managed Care - HMO | Attending: Emergency Medicine | Admitting: Emergency Medicine

## 2016-05-17 ENCOUNTER — Encounter (HOSPITAL_COMMUNITY): Payer: Self-pay

## 2016-05-17 DIAGNOSIS — Z7982 Long term (current) use of aspirin: Secondary | ICD-10-CM | POA: Diagnosis not present

## 2016-05-17 DIAGNOSIS — E119 Type 2 diabetes mellitus without complications: Secondary | ICD-10-CM | POA: Insufficient documentation

## 2016-05-17 DIAGNOSIS — K625 Hemorrhage of anus and rectum: Secondary | ICD-10-CM | POA: Insufficient documentation

## 2016-05-17 DIAGNOSIS — Z7901 Long term (current) use of anticoagulants: Secondary | ICD-10-CM | POA: Diagnosis not present

## 2016-05-17 DIAGNOSIS — Z79899 Other long term (current) drug therapy: Secondary | ICD-10-CM | POA: Insufficient documentation

## 2016-05-17 DIAGNOSIS — E039 Hypothyroidism, unspecified: Secondary | ICD-10-CM | POA: Diagnosis not present

## 2016-05-17 DIAGNOSIS — I10 Essential (primary) hypertension: Secondary | ICD-10-CM | POA: Diagnosis not present

## 2016-05-17 DIAGNOSIS — Z955 Presence of coronary angioplasty implant and graft: Secondary | ICD-10-CM | POA: Insufficient documentation

## 2016-05-17 DIAGNOSIS — Z7984 Long term (current) use of oral hypoglycemic drugs: Secondary | ICD-10-CM | POA: Diagnosis not present

## 2016-05-17 DIAGNOSIS — F17210 Nicotine dependence, cigarettes, uncomplicated: Secondary | ICD-10-CM | POA: Insufficient documentation

## 2016-05-17 DIAGNOSIS — I2510 Atherosclerotic heart disease of native coronary artery without angina pectoris: Secondary | ICD-10-CM | POA: Insufficient documentation

## 2016-05-17 LAB — CBC WITH DIFFERENTIAL/PLATELET
BASOS ABS: 0 10*3/uL (ref 0.0–0.1)
Basophils Relative: 0 %
Eosinophils Absolute: 0.4 10*3/uL (ref 0.0–0.7)
Eosinophils Relative: 4 %
HCT: 46.1 % (ref 39.0–52.0)
Hemoglobin: 16.1 g/dL (ref 13.0–17.0)
LYMPHS ABS: 2.5 10*3/uL (ref 0.7–4.0)
LYMPHS PCT: 25 %
MCH: 31.6 pg (ref 26.0–34.0)
MCHC: 34.9 g/dL (ref 30.0–36.0)
MCV: 90.6 fL (ref 78.0–100.0)
Monocytes Absolute: 0.8 10*3/uL (ref 0.1–1.0)
Monocytes Relative: 8 %
NEUTROS ABS: 6.4 10*3/uL (ref 1.7–7.7)
NEUTROS PCT: 63 %
PLATELETS: 183 10*3/uL (ref 150–400)
RBC: 5.09 MIL/uL (ref 4.22–5.81)
RDW: 12.8 % (ref 11.5–15.5)
WBC: 10 10*3/uL (ref 4.0–10.5)

## 2016-05-17 LAB — BASIC METABOLIC PANEL
ANION GAP: 10 (ref 5–15)
BUN: 10 mg/dL (ref 6–20)
CHLORIDE: 95 mmol/L — AB (ref 101–111)
CO2: 27 mmol/L (ref 22–32)
Calcium: 9.9 mg/dL (ref 8.9–10.3)
Creatinine, Ser: 0.95 mg/dL (ref 0.61–1.24)
Glucose, Bld: 342 mg/dL — ABNORMAL HIGH (ref 65–99)
POTASSIUM: 5 mmol/L (ref 3.5–5.1)
SODIUM: 132 mmol/L — AB (ref 135–145)

## 2016-05-17 LAB — ABO/RH: ABO/RH(D): O POS

## 2016-05-17 LAB — TYPE AND SCREEN
ABO/RH(D): O POS
Antibody Screen: NEGATIVE

## 2016-05-17 LAB — POC OCCULT BLOOD, ED: FECAL OCCULT BLD: POSITIVE — AB

## 2016-05-17 NOTE — ED Triage Notes (Signed)
Pt states recent cardiac stent placement. Pt states blood in stool this evening. Pt denies any abdominal pain. Pt denies any nausea/vomiting. Pt denies any chest pain or SOB.

## 2016-05-17 NOTE — ED Provider Notes (Signed)
[facility] DEPT Provider Note   CSN: [id number] Arrival date & time: 05/17/16  0022  By signing my name below, I, [name]., attest that this documentation has been prepared under the direction and in the presence of [name], MD. Electronically signed: [name]., ED Scribe. 05/17/16. 12:33 AM.   History   Chief Complaint Chief Complaint  Patient presents with  . Rectal Bleeding    HPI Anthony Woods is a 61 y.o. male with hx of cardiac stent placement who presents to the Emergency Department complaining of rectal bleeding with sudden onset x1 day. Pt states that he had x1 episode of rectal bleeding earlier today. He states that during this episode he saw stool and blood in the toilet. Pt denies abdominal pain, nausea, vomiting, chest pain, SOB, weakness, dizziness, epistaxis, hematuria. He denies hx of these episodes, hx of liver complications. Of note, pt takes blood thinners Plavix and Xarelto. He reports hx of 2 colonoscopies in the past, last being 3 years ago at the [facility].   The history is provided by the patient and the spouse. No language interpreter was used.  Rectal Bleeding  Quality:  Maroon Amount:  Scant Timing:  Sporadic Chronicity:  New Context: defecation   Similar prior episodes: no   Relieved by:  None tried Worsened by:  Nothing Ineffective treatments:  None tried Associated symptoms: no abdominal pain, no dizziness, no epistaxis, no fever and no vomiting   Risk factors: no liver disease     Past Medical History:  Diagnosis Date  . Allergy to bee sting   . Arthritis    "right knee" (05/12/2016)  . CAD (coronary artery disease)   . Headache    "when his sugar goes too high or too low" (05/12/2016)  . HTN (hypertension)   . Hyperlipidemia   . Hypothyroidism   . Macular degeneration   . Obesity   . Type II diabetes mellitus [facility])     Patient Active Problem List   Diagnosis Date Noted  . Atrial  fibrillation with RVR ([place]) 05/12/2016  . Elevated troponin   . New onset atrial fibrillation ([place]) 05/11/2016  . Allergic rhinitis 08/12/2015  . Chest pain with moderate risk of acute coronary syndrome 04/14/2015  . Urinary frequency 04/14/2015  . Osteoarthritis of right knee 12/09/2014  . Snoring 09/25/2013  . Rash and nonspecific skin eruption 08/08/2012  . Insomnia 08/08/2012  . Morbid obesity ([place]) 06/10/2011  . Well adult exam 06/10/2011  . Cough due to angiotensin-converting enzyme inhibitor 10/08/2010  . SHOULDER PAIN 03/09/2010  . NECK PAIN 03/09/2010  . TOBACCO USER 06/05/2009  . BRONCHITIS, ACUTE 12/13/2007  . Toxic effect of venom 08/02/2007  . Hypothyroidism 04/04/2007  . Type II diabetes mellitus, uncontrolled ([place]) 04/04/2007  . Essential hypertension 04/04/2007  . Coronary atherosclerosis 04/04/2007  . Edema 04/04/2007    Past Surgical History:  Procedure Laterality Date  . CARDIAC CATHETERIZATION    . CORONARY ANGIOPLASTY WITH STENT PLACEMENT  05/12/2016  . CORONARY STENT INTERVENTION N/A 05/12/2016   Procedure: Coronary Stent Intervention;  Surgeon: Jettie Booze, MD;  Location: [facility] CV LAB;  Service: Cardiovascular;  Laterality: N/A;  . GANGLION CYST EXCISION Left 1980s  . KNEE ARTHROSCOPY Right    Meniscus removal  . LEFT HEART CATH AND CORONARY ANGIOGRAPHY N/A 05/12/2016   Procedure: Left Heart Cath and Coronary Angiography;  Surgeon: Jettie Booze, MD;  Location: [facility] LAB;  Service: Cardiovascular;  Laterality: N/A;  . LEFT HEART CATHETERIZATION WITH CORONARY ANGIOGRAM N/A 02/04/2012   Procedure: LEFT HEART CATHETERIZATION WITH CORONARY ANGIOGRAM;  Surgeon: Burnell Blanks, MD;  Location: [facility] CATH LAB;  Service: Cardiovascular;  Laterality: N/A;       Home Medications    Prior to Admission medications   Medication Sig Start Date End Date Taking? Authorizing Provider  acetaminophen (TYLENOL) 650 MG CR tablet Take 650  mg by mouth every 8 (eight) hours as needed for pain.     Historical Provider, MD  aspirin 81 MG EC tablet Take 81 mg by mouth daily.      Historical Provider, MD  atenolol-chlorthalidone (TENORETIC) 100-25 MG tablet Take 1 tablet by mouth daily. 07/14/15   [name], MD  clopidogrel (PLAVIX) 75 MG tablet Take 1 tablet (75 mg total) by mouth daily with breakfast. 05/14/16   Evelene Croon Barrett, PA-C  diclofenac sodium (VOLTAREN) 1 % GEL Apply 1 application topically 4 (four) times daily. Patient taking differently: Apply 1 application topically daily as needed (pain).  04/13/16   Leonie Douglas, PA-C  diphenhydrAMINE (BENADRYL) 25 MG tablet Take 1 tablet (25 mg total) by mouth every 6 (six) hours. Patient taking differently: Take 25 mg by mouth every 6 (six) hours as needed for allergies. For allergic reactions 11/11/14   Fredia Sorrow, MD  EPINEPHrine 0.3 mg/0.3 mL IJ SOAJ injection Inject 0.3 mLs (0.3 mg total) into the muscle once. For anaphylactic reaction 10/06/15   Noemi Chapel, MD  HUMALOG MIX 75/25 KWIKPEN (75-25) 100 UNIT/ML Kwikpen Inject 30-40 Units into the skin 2 (two) times daily with a meal. Injects 40 units daily in morning and 30 units daily in evening. Patient taking differently: Inject 30-50 Units into the skin 2 (two) times daily with a meal. 50 units in the morning and 30 units in the evening 08/12/15   [name], MD  levothyroxine (SYNTHROID, LEVOTHROID) 175 MCG tablet Take 1 tablet (175 mcg total) by mouth daily. 08/12/15   [name], MD  linagliptin (TRADJENTA) 5 MG TABS tablet Take 1 tablet (5 mg total) by mouth daily. 08/12/15   [name], MD  losartan (COZAAR) 100 MG tablet TAKE 1 TABLET BY MOUTH DAILY. 09/18/15   [name], MD  metFORMIN (GLUCOPHAGE) 1000 MG tablet Take 1 tablet (1,000 mg total) by mouth 2 (two) times daily with a meal. 08/12/15   [name], MD  nitroGLYCERIN (NITROSTAT) 0.4 MG SL tablet Place 1 tablet  (0.4 mg total) under the tongue every 5 (five) minutes as needed for chest pain. 05/13/16   Rhonda G Barrett, PA-C  potassium chloride (K-DUR) 10 MEQ tablet Take 1 tablet (10 mEq total) by mouth 2 (two) times daily. 08/12/15   [name], MD  rivaroxaban (XARELTO) 20 MG TABS tablet Take 1 tablet (20 mg total) by mouth daily. 05/13/16   Rhonda G Barrett, PA-C  simvastatin (ZOCOR) 40 MG tablet Take 1 tablet (40 mg total) by mouth daily. Yearly physical due in January must see MD for refills 02/02/16   Cassandria Anger, MD  simvastatin (ZOCOR) 40 MG tablet Take 1 tablet (40 mg total) by mouth daily. Needs appointment 05/14/16   Cassandria Anger, MD    Family History Family History  Problem Relation Age of Onset  . Diabetes Father   . Cancer Father     stomach  . CVA Father   . Stomach cancer Father   . Diabetes Mother   .  Cancer Mother     male ca  . Colon cancer Mother 69  . Hypertension Other   . Diabetes Other   . CAD Maternal Grandmother     Social History Social History  Substance Use Topics  . Smoking status: Current Every Day Smoker    Packs/day: 2.00    Years: 42.00    Types: Cigarettes  . Smokeless tobacco: Never Used  . Alcohol use No     Allergies   Bee venom; Actos [pioglitazone hydrochloride]; Lipitor [atorvastatin]; and Lisinopril   Review of Systems Review of Systems  Constitutional: Negative for fever.  HENT: Negative for nosebleeds.   Cardiovascular: Negative for chest pain.  Gastrointestinal: Positive for blood in stool and hematochezia. Negative for abdominal pain and vomiting.  Genitourinary: Negative for hematuria.  Neurological: Negative for dizziness.  All other systems reviewed and are negative.    Physical Exam Updated Vital Signs BP 150/89 (BP Location: Left Arm)   Pulse 65   Temp 97.6 F (36.4 C) (Oral)   Resp 18   SpO2 94%   Physical Exam  CONSTITUTIONAL: Well developed/well nourished HEAD:  Normocephalic/atraumatic EYES: EOMI/PERRL ENMT: Mucous membranes moist NECK: supple no meningeal signs SPINE/BACK:entire spine nontender CV: S1/S2 noted,  LUNGS: Lungs are clear to auscultation bilaterally, no apparent distress ABDOMEN: soft, nontender, no rebound or guarding, bowel sounds noted throughout abdomen GU:no cva tenderness Rectal: No external hemorrhoids, no masses, stool color brown, no melena or blood, chaperone present NEURO: Pt is awake/alert/appropriate, moves all extremitiesx4.  No facial droop.   EXTREMITIES: pulses normal/equal, full ROM SKIN: warm, color normal PSYCH: no abnormalities of mood noted, alert and oriented to situation  ED Treatments / Results   DIAGNOSTIC STUDIES: Oxygen Saturation is 94% on RA, inadequate by my interpretation.   COORDINATION OF CARE: 12:33 AM-Discussed next steps with pt. Pt verbalized understanding and is agreeable with the plan.    Labs (all labs ordered are listed, but only abnormal results are displayed) Labs Reviewed  BASIC METABOLIC PANEL - Abnormal; Notable for the following:       Result Value   Sodium 132 (*)    Chloride 95 (*)    Glucose, Bld 342 (*)    All other components within normal limits  POC OCCULT BLOOD, ED - Abnormal; Notable for the following:    Fecal Occult Bld POSITIVE (*)    All other components within normal limits  CBC WITH DIFFERENTIAL/PLATELET  TYPE AND SCREEN  ABO/RH    EKG  EKG Interpretation None       Radiology No results found.  Procedures Procedures (including critical care time)  Medications Ordered in ED Medications - No data to display   Initial Impression / Assessment and Plan / ED Course  I have reviewed the triage vital signs and the nursing notes.  Pertinent labs results that were available during my care of the patient were reviewed by me and considered in my medical decision making (see chart for details).     2:17 AM Pt stable Recent admission with new  onset afib and on xarelto as well as plavix s/p stent placement He had an isolated episode of blood mixed stool No melena reported No hematemesis Rectal exam here does not reveal gross blood or melena but hemoccult positive likely from earlier HGB stable Previous c-scope from 2014 pt had polypectomy, no issues since Pt has done well since that time Will monitor 3:35 AM Pt stable No new complaints He had no further bleeding  issues here HGB stable At this point, my suspicion for serious cause of GI bleed is low given the appearance of his stool on rectal exam.  No melena to suggest upper GI bleed I feel he is stable for d/c home.  At this point I feel benefit of plavix/xarelto outweigh risk of further bleeding We discussed strict return precautions He has already seen/known by [facility]  Final Clinical Impressions(s) / ED Diagnoses   Final diagnoses:  Rectal bleeding    New Prescriptions New Prescriptions   No medications on file   I personally performed the services described in this documentation, which was scribed in my presence. The recorded information has been reviewed and is accurate.       [name], MD 05/17/16 [phone]

## 2016-05-21 ENCOUNTER — Ambulatory Visit (INDEPENDENT_AMBULATORY_CARE_PROVIDER_SITE_OTHER): Payer: Commercial Managed Care - HMO | Admitting: Cardiology

## 2016-05-21 ENCOUNTER — Encounter: Payer: Self-pay | Admitting: Cardiology

## 2016-05-21 VITALS — BP 124/60 | HR 60 | Ht 65.0 in | Wt 270.4 lb

## 2016-05-21 DIAGNOSIS — I2510 Atherosclerotic heart disease of native coronary artery without angina pectoris: Secondary | ICD-10-CM

## 2016-05-21 DIAGNOSIS — Z9861 Coronary angioplasty status: Secondary | ICD-10-CM

## 2016-05-21 NOTE — Patient Instructions (Signed)
Medication Instructions:  Stop taking Aspirin. All other medications remain the same.  Labwork: None  Testing/Procedures: None  Follow-Up: In 3 months with Dr Angelena Form.       If you need a refill on your cardiac medications before your next appointment, please call your pharmacy.

## 2016-05-21 NOTE — Progress Notes (Signed)
05/21/2016 Anthony Woods   1955/03/23  OV:[id number]  Primary Physician Walker Kehr, MD Primary Cardiologist: Dr. Angelena Form    Reason for Visit/CC: [facility] f/u for Atrial Fibrillation and CAD s/p PCI.  HPI:  61 y.o.malewith hx of mild obst. CAD in 2013, HTN, IDDM, obesity and tobacco abuse, who was recently admitted with chest pain and dyspnea, found to be in new onset atrial fibrillation. He converted with Cardizem but developed bradycardia afterwards. His troponin's were mildly elevated with flat trend. However, because of his CP in the setting of elevated HR, and poorly controlled CRFs, cardiac cath was recommended. This was performed on 05/12/16. He had significant stenosis in his CFX (75%), treated w/ Synergy DES. With a CHA2DS2VASc of 3, anticoagulation is indicated. He was started on Xarelto after the cath.  Also Plavix. No ASA. Continued on losartan and simvastatin. No AVN blocking agent given issues with bradycardia post conversion. Echo showed normal LVEF at 50-55%. His DM was noted to be poorly controlled. BG levels were in the 200s. Hgb A1c was 9.6.  He presents back to clinic today for f/u. He denies any recurrent CP. No dyspnea. No issues with cath site. He reports full compliance with his meds.. Tolerating well. No side effects. He quit smoking last week. He is doing well with this w/o cravings. He plans to f/u with his PCP regarding his DM.   Current Meds  Medication Sig  . aspirin 81 MG EC tablet Take 81 mg by mouth daily.    Marland Kitchen atenolol-chlorthalidone (TENORETIC) 100-25 MG tablet Take 1 tablet by mouth daily.  . clopidogrel (PLAVIX) 75 MG tablet Take 1 tablet (75 mg total) by mouth daily with breakfast.  . diclofenac sodium (VOLTAREN) 1 % GEL Apply 2 g topically as needed (pain).  Marland Kitchen diphenhydrAMINE (BENADRYL) 25 mg capsule Take 25 mg by mouth as needed (bee stings).  . EPINEPHrine 0.3 mg/0.3 mL IJ SOAJ injection Inject 0.3 mLs (0.3 mg total) into the muscle once. For  anaphylactic reaction  . HUMALOG MIX 75/25 KWIKPEN (75-25) 100 UNIT/ML Kwikpen Inject 30-40 Units into the skin 2 (two) times daily with a meal. Injects 40 units daily in morning and 30 units daily in evening. (Patient taking differently: Inject 30-50 Units into the skin 2 (two) times daily with a meal. 50 units in the morning and 30 units in the evening)  . levothyroxine (SYNTHROID, LEVOTHROID) 175 MCG tablet Take 1 tablet (175 mcg total) by mouth daily.  Marland Kitchen linagliptin (TRADJENTA) 5 MG TABS tablet Take 1 tablet (5 mg total) by mouth daily.  Marland Kitchen losartan (COZAAR) 100 MG tablet TAKE 1 TABLET BY MOUTH DAILY.  . meloxicam (MOBIC) 15 MG tablet Take 15 mg by mouth as needed for pain (knee).   . metFORMIN (GLUCOPHAGE) 1000 MG tablet Take 1 tablet (1,000 mg total) by mouth 2 (two) times daily with a meal.  . nitroGLYCERIN (NITROSTAT) 0.4 MG SL tablet Place 1 tablet (0.4 mg total) under the tongue every 5 (five) minutes as needed for chest pain.  . potassium chloride (K-DUR) 10 MEQ tablet Take 1 tablet (10 mEq total) by mouth 2 (two) times daily.  . rivaroxaban (XARELTO) 20 MG TABS tablet Take 1 tablet (20 mg total) by mouth daily.  . simvastatin (ZOCOR) 40 MG tablet Take 1 tablet (40 mg total) by mouth daily. Yearly physical due in January must see MD for refills  . [DISCONTINUED] simvastatin (ZOCOR) 40 MG tablet Take 1 tablet (40 mg total) by mouth  daily. Needs appointment   Allergies  Allergen Reactions  . Bee Venom Anaphylaxis  . Actos [Pioglitazone Hydrochloride] Swelling  . Lipitor [Atorvastatin] Other (See Comments)    Patient cannot recall reaction  . Lisinopril Other (See Comments)    cough   Past Medical History:  Diagnosis Date  . Allergy to bee sting   . Arthritis    "right knee" (05/12/2016)  . CAD (coronary artery disease)   . Headache    "when his sugar goes too high or too low" (05/12/2016)  . HTN (hypertension)   . Hyperlipidemia   . Hypothyroidism   . Macular degeneration   .  Obesity   . Type II diabetes mellitus (HCC)    Family History  Problem Relation Age of Onset  . Diabetes Father   . Cancer Father     stomach  . CVA Father   . Stomach cancer Father   . Diabetes Mother   . Cancer Mother     male ca  . Colon cancer Mother 47  . Hypertension Other   . Diabetes Other   . CAD Maternal Grandmother    Past Surgical History:  Procedure Laterality Date  . CARDIAC CATHETERIZATION    . CORONARY ANGIOPLASTY WITH STENT PLACEMENT  05/12/2016  . CORONARY STENT INTERVENTION N/A 05/12/2016   Procedure: Coronary Stent Intervention;  Surgeon: Jettie Booze, MD;  Location: [place] [facility];  Service: Cardiovascular;  Laterality: N/A;  . GANGLION CYST EXCISION Left 1980s  . KNEE ARTHROSCOPY Right    Meniscus removal  . LEFT HEART CATH AND CORONARY ANGIOGRAPHY N/A 05/12/2016   Procedure: Left Heart Cath and Coronary Angiography;  Surgeon: Jettie Booze, MD;  Location: [facility];  Service: Cardiovascular;  Laterality: N/A;  . LEFT HEART CATHETERIZATION WITH CORONARY ANGIOGRAM N/A 02/04/2012   Procedure: LEFT HEART CATHETERIZATION WITH CORONARY ANGIOGRAM;  Surgeon: Burnell Blanks, MD;  Location: [facility];  Service: Cardiovascular;  Laterality: N/A;   Social History   Social History  . Marital status: Married    Spouse name: N/A  . Number of children: 1  . Years of education: N/A   Occupational History  . [employer]   . G[employer]VE [employer]GER [employer]   Social History Main Topics  . Smoking status: Former Smoker    Packs/day: 2.00    Years: 42.00    Types: Cigarettes    Quit date: 05/11/2016  . Smokeless tobacco: Never Used  . Alcohol use No  . Drug use: No  . Sexual activity: Not Currently   Other Topics Concern  . Not on file   Social History Narrative  . No narrative on file     Review of Systems: General: negative for chills, fever, night sweats or weight changes.  Cardiovascular: negative for chest pain,  dyspnea on exertion, edema, orthopnea, palpitations, paroxysmal nocturnal dyspnea or shortness of breath Dermatological: negative for rash Respiratory: negative for cough or wheezing Urologic: negative for hematuria Abdominal: negative for nausea, vomiting, diarrhea, bright red blood per rectum, melena, or hematemesis Neurologic: negative for visual changes, syncope, or dizziness All other systems reviewed and are otherwise negative except as noted above.   Physical Exam:  Blood pressure 124/60, pulse 60, height 5\' 5"  (1.651 m), weight 270 lb 6.4 oz (122.7 kg), SpO2 98 %.  General appearance: alert, cooperative, no distress and moderately obese Neck: no carotid bruit and no JVD Lungs: clear to auscultation bilaterally Heart: regular rate and rhythm,  S1, S2 normal, no murmur, click, rub or gallop Extremities: extremities normal, atraumatic, no cyanosis or edema Pulses: 2+ and symmetric Skin: Skin color, texture, turgor normal. No rashes or lesions Neurologic: Grossly normal  EKG not performed   ASSESSMENT AND PLAN:   1. CAD: s/p recent NSTEMI. Significant stenosis in his CFX (75%), treated w/ Synergy DES. Stable w/o recurrent angina. Continue Plavix and Xarelto. No ASA. No BB given bradycardia. Continue losartan. Needs better control of DM. He quit smoking which is great.   2. PAF: converted in hospital with IV Cardizem but then developed bradycardia. He is not on any AVN blocking agents for this reasons. HR is controlled at 60 bpm. RRR on exam. He denies any palpitations since discharge. CHA2DS2 VASc score is 3. He is on Xarelto and fully compliant.   3. HTN: BP is controlled on current regimen.   4. DM: poorly controlled. Hgb A1c was 9.6. He plans to f/u with his PCP for further management.    PLAN  F/u with Dr. Angelena Form in 2-3 months.   Cylah Fannin PA-C 05/21/2016 11:26 AM

## 2016-05-28 ENCOUNTER — Telehealth (HOSPITAL_COMMUNITY): Payer: Self-pay | Admitting: Internal Medicine

## 2016-05-28 NOTE — Telephone Encounter (Signed)
Patient UHC and insurance benefits was verified. UHC: $25.00 co-payment, deductible $500.00/$500.00 has been met, out of pocket $3000/$3000 has been met, no co-insurance, no pre-authorization and no limit on visit. Passport/reference [phone]. Information given to [facility] for review.

## 2016-06-04 ENCOUNTER — Other Ambulatory Visit (INDEPENDENT_AMBULATORY_CARE_PROVIDER_SITE_OTHER): Payer: Commercial Managed Care - HMO

## 2016-06-04 ENCOUNTER — Ambulatory Visit (INDEPENDENT_AMBULATORY_CARE_PROVIDER_SITE_OTHER): Payer: Commercial Managed Care - HMO | Admitting: Internal Medicine

## 2016-06-04 ENCOUNTER — Encounter: Payer: Self-pay | Admitting: Internal Medicine

## 2016-06-04 VITALS — BP 126/68 | HR 72 | Temp 98.7°F | Resp 16 | Ht 65.0 in | Wt 275.5 lb

## 2016-06-04 DIAGNOSIS — I4891 Unspecified atrial fibrillation: Secondary | ICD-10-CM

## 2016-06-04 DIAGNOSIS — I10 Essential (primary) hypertension: Secondary | ICD-10-CM

## 2016-06-04 DIAGNOSIS — E1159 Type 2 diabetes mellitus with other circulatory complications: Secondary | ICD-10-CM

## 2016-06-04 DIAGNOSIS — IMO0002 Reserved for concepts with insufficient information to code with codable children: Secondary | ICD-10-CM

## 2016-06-04 DIAGNOSIS — I2510 Atherosclerotic heart disease of native coronary artery without angina pectoris: Secondary | ICD-10-CM

## 2016-06-04 DIAGNOSIS — E1165 Type 2 diabetes mellitus with hyperglycemia: Secondary | ICD-10-CM

## 2016-06-04 DIAGNOSIS — F17200 Nicotine dependence, unspecified, uncomplicated: Secondary | ICD-10-CM | POA: Diagnosis not present

## 2016-06-04 LAB — HEPATIC FUNCTION PANEL
ALK PHOS: 61 U/L (ref 39–117)
ALT: 24 U/L (ref 0–53)
AST: 21 U/L (ref 0–37)
Albumin: 4.1 g/dL (ref 3.5–5.2)
BILIRUBIN DIRECT: 0 mg/dL (ref 0.0–0.3)
BILIRUBIN TOTAL: 0.3 mg/dL (ref 0.2–1.2)
TOTAL PROTEIN: 6.6 g/dL (ref 6.0–8.3)

## 2016-06-04 LAB — CBC WITH DIFFERENTIAL/PLATELET
BASOS ABS: 0.1 10*3/uL (ref 0.0–0.1)
BASOS PCT: 0.6 % (ref 0.0–3.0)
EOS ABS: 0.3 10*3/uL (ref 0.0–0.7)
Eosinophils Relative: 2.8 % (ref 0.0–5.0)
HEMATOCRIT: 44.9 % (ref 39.0–52.0)
Hemoglobin: 15.2 g/dL (ref 13.0–17.0)
LYMPHS PCT: 19.8 % (ref 12.0–46.0)
Lymphs Abs: 2 10*3/uL (ref 0.7–4.0)
MCHC: 34 g/dL (ref 30.0–36.0)
MCV: 91.3 fl (ref 78.0–100.0)
MONO ABS: 0.5 10*3/uL (ref 0.1–1.0)
Monocytes Relative: 5.4 % (ref 3.0–12.0)
Neutro Abs: 7.1 10*3/uL (ref 1.4–7.7)
Neutrophils Relative %: 71.4 % (ref 43.0–77.0)
Platelets: 204 10*3/uL (ref 150.0–400.0)
RBC: 4.91 Mil/uL (ref 4.22–5.81)
RDW: 12.9 % (ref 11.5–15.5)
WBC: 10 10*3/uL (ref 4.0–10.5)

## 2016-06-04 LAB — BASIC METABOLIC PANEL
BUN: 9 mg/dL (ref 6–23)
CHLORIDE: 92 meq/L — AB (ref 96–112)
CO2: 33 mEq/L — ABNORMAL HIGH (ref 19–32)
CREATININE: 1.03 mg/dL (ref 0.40–1.50)
Calcium: 10.4 mg/dL (ref 8.4–10.5)
GFR: 78.32 mL/min (ref >60.00)
Glucose, Bld: 356 mg/dL — ABNORMAL HIGH (ref 70–99)
Potassium: 4 mEq/L (ref 3.5–5.1)
Sodium: 131 mEq/L — ABNORMAL LOW (ref 135–145)

## 2016-06-04 LAB — HEMOGLOBIN A1C: HEMOGLOBIN A1C: 10.1 % — AB (ref 4.6–6.5)

## 2016-06-04 MED ORDER — EPINEPHRINE 0.3 MG/0.3ML IJ SOAJ: 0.3000 mg | Freq: Once | INTRAMUSCULAR | 3 refills | Status: DC

## 2016-06-04 NOTE — Assessment & Plan Note (Signed)
Losartan Atenolol-HCT

## 2016-06-04 NOTE — Assessment & Plan Note (Signed)
Plavix Atenolol-HCT

## 2016-06-04 NOTE — Assessment & Plan Note (Signed)
Wt Readings from Last 3 Encounters:  06/04/16 275 lb 8 oz (125 kg)  05/21/16 270 lb 6.4 oz (122.7 kg)  05/13/16 264 lb 8.8 oz (120 kg)  worse

## 2016-06-04 NOTE — Assessment & Plan Note (Signed)
Xarelto Atenolol-HCT 

## 2016-06-04 NOTE — Progress Notes (Signed)
Pre-visit discussion using our clinic review tool. No additional management support is needed unless otherwise documented below in the visit note.  

## 2016-06-04 NOTE — Assessment & Plan Note (Signed)
Humalog, Metformin, Tradjenta

## 2016-06-04 NOTE — Assessment & Plan Note (Signed)
Epipen Rx

## 2016-06-04 NOTE — Progress Notes (Signed)
Subjective:  Patient ID: Anthony Woods, male    DOB: [birth year]  Age: 61 y.o. MRN: [id number]  CC: Annual Exam (DM type 2, HTN, OA, A fib, hypothyroidism )   HPI Anthony Woods presents for CAD, HTN, DM f/u - he is trying to smoke less (now down to 1/3 ppd from 2 ppd) F/u A fib in Feb - hosp stay was reviewed  Outpatient Medications Prior to Visit  Medication Sig Dispense Refill  . atenolol-chlorthalidone (TENORETIC) 100-25 MG tablet Take 1 tablet by mouth daily. 90 tablet 3  . clopidogrel (PLAVIX) 75 MG tablet Take 1 tablet (75 mg total) by mouth daily with breakfast. 30 tablet 11  . diclofenac sodium (VOLTAREN) 1 % GEL Apply 2 g topically as needed (pain).    Marland Kitchen diphenhydrAMINE (BENADRYL) 25 mg capsule Take 25 mg by mouth as needed (bee stings).    . EPINEPHrine 0.3 mg/0.3 mL IJ SOAJ injection Inject 0.3 mLs (0.3 mg total) into the muscle once. For anaphylactic reaction 2 Device 0  . HUMALOG MIX 75/25 KWIKPEN (75-25) 100 UNIT/ML Kwikpen Inject 30-40 Units into the skin 2 (two) times daily with a meal. Injects 40 units daily in morning and 30 units daily in evening. (Patient taking differently: Inject 30-50 Units into the skin 2 (two) times daily with a meal. 50 units in the morning and 30 units in the evening) 15 mL 3  . levothyroxine (SYNTHROID, LEVOTHROID) 175 MCG tablet Take 1 tablet (175 mcg total) by mouth daily. 90 tablet 3  . linagliptin (TRADJENTA) 5 MG TABS tablet Take 1 tablet (5 mg total) by mouth daily. 90 tablet 3  . losartan (COZAAR) 100 MG tablet TAKE 1 TABLET BY MOUTH DAILY. 90 tablet 3  . meloxicam (MOBIC) 15 MG tablet Take 15 mg by mouth as needed for pain (knee).   0  . metFORMIN (GLUCOPHAGE) 1000 MG tablet Take 1 tablet (1,000 mg total) by mouth 2 (two) times daily with a meal. 180 tablet 3  . nitroGLYCERIN (NITROSTAT) 0.4 MG SL tablet Place 1 tablet (0.4 mg total) under the tongue every 5 (five) minutes as needed for chest pain. 25 tablet 3  . potassium chloride  (K-DUR) 10 MEQ tablet Take 1 tablet (10 mEq total) by mouth 2 (two) times daily. 180 tablet 3  . rivaroxaban (XARELTO) 20 MG TABS tablet Take 1 tablet (20 mg total) by mouth daily. 30 tablet 11  . simvastatin (ZOCOR) 40 MG tablet Take 1 tablet (40 mg total) by mouth daily. Yearly physical due in January must see MD for refills 30 tablet 2   No facility-administered medications prior to visit.     ROS Review of Systems  Constitutional: Negative for appetite change, fatigue and unexpected weight change.  HENT: Negative for congestion, nosebleeds, sneezing, sore throat and trouble swallowing.   Eyes: Negative for itching and visual disturbance.  Respiratory: Negative for cough.   Cardiovascular: Negative for chest pain, palpitations and leg swelling.  Gastrointestinal: Negative for abdominal distention, blood in stool, diarrhea and nausea.  Genitourinary: Negative for frequency and hematuria.  Musculoskeletal: Negative for back pain, gait problem, joint swelling and neck pain.  Skin: Negative for rash.  Neurological: Negative for dizziness, tremors, speech difficulty and weakness.  Psychiatric/Behavioral: Negative for agitation, dysphoric mood and sleep disturbance. The patient is not nervous/anxious.     Objective:  BP 126/68   Pulse 72   Temp 98.7 F (37.1 C) (Oral)   Resp 16   Ht 5'  5" (1.651 m)   Wt 275 lb 8 oz (125 kg)   SpO2 95%   BMI 45.85 kg/m   BP Readings from Last 3 Encounters:  06/04/16 126/68  05/21/16 124/60  05/17/16 116/68    Wt Readings from Last 3 Encounters:  06/04/16 275 lb 8 oz (125 kg)  05/21/16 270 lb 6.4 oz (122.7 kg)  05/13/16 264 lb 8.8 oz (120 kg)    Physical Exam  Constitutional: He is oriented to person, place, and time. He appears well-developed. No distress.  NAD  HENT:  Mouth/Throat: Oropharynx is clear and moist.  Eyes: Conjunctivae are normal. Pupils are equal, round, and reactive to light.  Neck: Normal range of motion. No JVD  present. No thyromegaly present.  Cardiovascular: Normal rate, regular rhythm, normal heart sounds and intact distal pulses.  Exam reveals no gallop and no friction rub.   No murmur heard. Pulmonary/Chest: Effort normal. No respiratory distress. He has wheezes. He has no rales. He exhibits no tenderness.  Abdominal: Soft. Bowel sounds are normal. He exhibits no distension and no mass. There is no tenderness. There is no rebound and no guarding.  Musculoskeletal: Normal range of motion. He exhibits edema. He exhibits no tenderness.  Lymphadenopathy:    He has no cervical adenopathy.  Neurological: He is alert and oriented to person, place, and time. He has normal reflexes. No cranial nerve deficit. He exhibits normal muscle tone. He displays a negative Romberg sign. Coordination and gait normal.  Skin: Skin is warm and dry. No rash noted.  Psychiatric: He has a normal mood and affect. His behavior is normal. Judgment and thought content normal.  edema 1+ B  Lab Results  Component Value Date   WBC 10.0 05/17/2016   HGB 16.1 05/17/2016   HCT 46.1 05/17/2016   PLT 183 05/17/2016   GLUCOSE 342 (H) 05/17/2016   CHOL 170 04/14/2015   TRIG 163.0 (H) 04/14/2015   HDL 37.60 (L) 04/14/2015   LDLDIRECT 100.0 12/09/2014   LDLCALC 100 (H) 04/14/2015   ALT 19 05/12/2016   AST 22 05/12/2016   NA 132 (L) 05/17/2016   K 5.0 05/17/2016   CL 95 (L) 05/17/2016   CREATININE 0.95 05/17/2016   BUN 10 05/17/2016   CO2 27 05/17/2016   TSH 2.300 05/12/2016   PSA 0.46 04/14/2015   INR 1.12 05/12/2016   HGBA1C 9.6 (H) 05/12/2016   MICROALBUR 4.7 (H) 12/06/2013    No results found.  Assessment & Plan:   There are no diagnoses linked to this encounter. I am having Mr. Keep maintain his atenolol-chlorthalidone, HUMALOG MIX 75/25 KWIKPEN, linagliptin, levothyroxine, metFORMIN, potassium chloride, losartan, EPINEPHrine, simvastatin, clopidogrel, rivaroxaban, nitroGLYCERIN, diclofenac sodium,  diphenhydrAMINE, and meloxicam.  No orders of the defined types were placed in this encounter.    Follow-up: No Follow-up on file.  Walker Kehr, MD

## 2016-06-04 NOTE — Assessment & Plan Note (Signed)
he is trying to smoke less (now down to 1/3 ppd from 2 ppd) Discussed

## 2016-06-09 ENCOUNTER — Telehealth (HOSPITAL_COMMUNITY): Payer: Self-pay | Admitting: Internal Medicine

## 2016-06-09 NOTE — Telephone Encounter (Signed)
I called and left message on patient voicemail to call the office about scheduling and participating in the Cardiac Rehab program. I left my contact information on patient voicemail to return my call.  ° °

## 2016-06-29 ENCOUNTER — Telehealth (HOSPITAL_COMMUNITY): Payer: Self-pay | Admitting: Internal Medicine

## 2016-06-29 NOTE — Telephone Encounter (Signed)
*  second phone call * I called and left message on patient voicemail to call the office about scheduling and participating in the Cardiac Rehab program. I left my contact information on patient voicemail to return my call.

## 2016-07-02 ENCOUNTER — Other Ambulatory Visit: Payer: Self-pay | Admitting: Internal Medicine

## 2016-07-07 ENCOUNTER — Encounter (HOSPITAL_COMMUNITY): Payer: Self-pay | Admitting: Internal Medicine

## 2016-07-07 NOTE — Progress Notes (Signed)
Final notice to patient. Patient has been called 2X. I have mailed patient a letter with information about the Cardiac Rehab program. Referral will be canceled if no response.

## 2016-07-08 ENCOUNTER — Other Ambulatory Visit: Payer: Self-pay

## 2016-07-08 MED ORDER — EPINEPHRINE 0.3 MG/0.3ML IJ SOAJ: 0.3000 mg | Freq: Once | INTRAMUSCULAR | 3 refills | Status: AC

## 2016-07-08 NOTE — Telephone Encounter (Signed)
Rec'd fax from [facility] for Epinephrine 0.3mg  auto-inj

## 2016-08-06 ENCOUNTER — Other Ambulatory Visit: Payer: Self-pay | Admitting: Internal Medicine

## 2016-08-11 DIAGNOSIS — E1165 Type 2 diabetes mellitus with hyperglycemia: Secondary | ICD-10-CM | POA: Diagnosis not present

## 2016-08-11 DIAGNOSIS — E7800 Pure hypercholesterolemia, unspecified: Secondary | ICD-10-CM | POA: Diagnosis not present

## 2016-08-11 DIAGNOSIS — E039 Hypothyroidism, unspecified: Secondary | ICD-10-CM | POA: Diagnosis not present

## 2016-08-18 DIAGNOSIS — E039 Hypothyroidism, unspecified: Secondary | ICD-10-CM | POA: Diagnosis not present

## 2016-08-18 DIAGNOSIS — I10 Essential (primary) hypertension: Secondary | ICD-10-CM | POA: Diagnosis not present

## 2016-08-18 DIAGNOSIS — E1165 Type 2 diabetes mellitus with hyperglycemia: Secondary | ICD-10-CM | POA: Diagnosis not present

## 2016-08-23 ENCOUNTER — Ambulatory Visit (INDEPENDENT_AMBULATORY_CARE_PROVIDER_SITE_OTHER): Payer: Commercial Managed Care - HMO | Admitting: Cardiovascular Disease

## 2016-08-23 ENCOUNTER — Encounter: Payer: Self-pay | Admitting: Cardiovascular Disease

## 2016-08-23 VITALS — BP 140/82 | HR 63 | Ht 65.0 in | Wt 269.0 lb

## 2016-08-23 DIAGNOSIS — I480 Paroxysmal atrial fibrillation: Secondary | ICD-10-CM

## 2016-08-23 DIAGNOSIS — I2510 Atherosclerotic heart disease of native coronary artery without angina pectoris: Secondary | ICD-10-CM | POA: Diagnosis not present

## 2016-08-23 DIAGNOSIS — I10 Essential (primary) hypertension: Secondary | ICD-10-CM | POA: Diagnosis not present

## 2016-08-23 DIAGNOSIS — Z720 Tobacco use: Secondary | ICD-10-CM | POA: Diagnosis not present

## 2016-08-23 MED ORDER — CLOPIDOGREL BISULFATE 75 MG PO TABS: 75.0000 mg | ORAL_TABLET | Freq: Every day | ORAL | 11 refills | Status: DC

## 2016-08-23 MED ORDER — RIVAROXABAN 20 MG PO TABS: 20.0000 mg | ORAL_TABLET | Freq: Every day | ORAL | 11 refills | Status: DC

## 2016-08-23 MED ORDER — NITROGLYCERIN 0.4 MG SL SUBL: 0.4000 mg | SUBLINGUAL_TABLET | SUBLINGUAL | 6 refills | Status: DC | PRN

## 2016-08-23 NOTE — Patient Instructions (Signed)

## 2016-08-23 NOTE — Progress Notes (Signed)
Chief Complaint  Patient presents with  . Follow-up    CAD   History of Present Illness: 61 yo male with history of atrial fibrillation, CAD, HTN, DM, obesity and tobacco abuse who is here today for cardiac follow up. I have not seen him since 2014. He was admitted to [facility] February 2018 with chest pain and was found to be in atrial fibrillation. 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 He was discharged on Plavix and Xarelto. No beta blocker given bradycardia. Echo February 2018 with LVEF=50-55%.   He is here today for follow up. The patient denies any chest pain, dyspnea, palpitations, lower extremity edema, orthopnea, PND, dizziness, near syncope or syncope. He has continued to smoke 1ppd.    Primary Care Physician: Cassandria Anger, MD  Past Medical History:  Diagnosis Date  . Allergy to bee sting   . Arthritis    "right knee" (05/12/2016)  . CAD (coronary artery disease)   . Headache    "when his sugar goes too high or too low" (05/12/2016)  . HTN (hypertension)   . Hyperlipidemia   . Hypothyroidism   . Macular degeneration   . Obesity   . Type II diabetes mellitus ([place])     Past Surgical History:  Procedure Laterality Date  . CARDIAC CATHETERIZATION    . CORONARY ANGIOPLASTY WITH STENT PLACEMENT  05/12/2016  . CORONARY STENT INTERVENTION N/A 05/12/2016   Procedure: Coronary Stent Intervention;  Surgeon: Jettie Booze, MD;  Location: [facility];  Service: Cardiovascular;  Laterality: N/A;  . GANGLION CYST EXCISION Left 1980s  . KNEE ARTHROSCOPY Right    Meniscus removal  . LEFT HEART CATH AND CORONARY ANGIOGRAPHY N/A 05/12/2016   Procedure: Left Heart Cath and Coronary Angiography;  Surgeon: Jettie Booze, MD;  Location: [facility];  Service: Cardiovascular;  Laterality: N/A;  . LEFT HEART CATHETERIZATION WITH CORONARY ANGIOGRAM N/A 02/04/2012   Procedure: LEFT HEART  CATHETERIZATION WITH CORONARY ANGIOGRAM;  Surgeon: Burnell Blanks, MD;  Location: [facility];  Service: Cardiovascular;  Laterality: N/A;    Current Outpatient Prescriptions  Medication Sig Dispense Refill  . atenolol-chlorthalidone (TENORETIC) 100-25 MG tablet TAKE 1 TABLET BY MOUTH DAILY. 90 tablet 3  . clopidogrel (PLAVIX) 75 MG tablet Take 1 tablet (75 mg total) by mouth daily with breakfast. 30 tablet 11  . diclofenac sodium (VOLTAREN) 1 % GEL Apply 2 g topically as needed (pain).    Marland Kitchen diphenhydrAMINE (BENADRYL) 25 mg capsule Take 25 mg by mouth as needed (bee stings).    Marland Kitchen HUMALOG MIX 75/25 KWIKPEN (75-25) 100 UNIT/ML Kwikpen Inject into the skin 2 (two) times daily with a meal. Inject 60 units into skin 2 times daily with meals. Injects 40 units at night     . levothyroxine (SYNTHROID, LEVOTHROID) 175 MCG tablet Take 1 tablet (175 mcg total) by mouth daily. 90 tablet 3  . linagliptin (TRADJENTA) 5 MG TABS tablet Take 1 tablet (5 mg total) by mouth daily. 90 tablet 3  . losartan (COZAAR) 100 MG tablet TAKE 1 TABLET BY MOUTH DAILY. 90 tablet 3  . meloxicam (MOBIC) 15 MG tablet Take 15 mg by mouth as needed for pain (knee).   0  . metFORMIN (GLUCOPHAGE) 1000 MG tablet Take 1 tablet (1,000 mg total) by mouth 2 (two) times daily with a meal. 180 tablet 3  . nitroGLYCERIN (NITROSTAT) 0.4 MG SL tablet Place  1 tablet (0.4 mg total) under the tongue every 5 (five) minutes as needed for chest pain. 25 tablet 6  . potassium chloride (K-DUR) 10 MEQ tablet Take 1 tablet (10 mEq total) by mouth 2 (two) times daily. 180 tablet 3  . rivaroxaban (XARELTO) 20 MG TABS tablet Take 1 tablet (20 mg total) by mouth daily. 30 tablet 11  . simvastatin (ZOCOR) 40 MG tablet Take 1 tablet (40 mg total) by mouth daily. Yearly physical due in January must see MD for refills 30 tablet 2   No current facility-administered medications for this visit.     Allergies  Allergen Reactions  . Bee Venom Anaphylaxis   . Actos [Pioglitazone Hydrochloride] Swelling  . Lipitor [Atorvastatin] Other (See Comments)    Patient cannot recall reaction  . Lisinopril Other (See Comments)    cough    Social History   Social History  . Marital status: Married    Spouse name: N/A  . Number of children: 1  . Years of education: N/A   Occupational History  . [employer]   . [employer]   Social History Main Topics  . Smoking status: Former Smoker    Packs/day: 2.00    Years: 42.00    Types: Cigarettes    Quit date: 05/11/2016  . Smokeless tobacco: Never Used  . Alcohol use No  . Drug use: No  . Sexual activity: Not Currently   Other Topics Concern  . Not on file   Social History Narrative  . No narrative on file    Family History  Problem Relation Age of Onset  . Diabetes Father   . Cancer Father        stomach  . CVA Father   . Stomach cancer Father   . Diabetes Mother   . Cancer Mother        male ca  . Colon cancer Mother 62  . Hypertension Other   . Diabetes Other   . CAD Maternal Grandmother     Review of Systems:  As stated in the HPI and otherwise negative.   BP 140/82   Pulse 63   Ht 5\' 5"  (1.651 m)   Wt 269 lb (122 kg)   SpO2 94%   BMI 44.76 kg/m   Physical Examination: General: Well developed, well nourished, NAD  HEENT: OP clear, mucus membranes moist  SKIN: warm, dry. No rashes. Neuro: No focal deficits  Musculoskeletal: Muscle strength 5/5 all ext  Psychiatric: Mood and affect normal  Neck: No JVD, no carotid bruits, no thyromegaly, no lymphadenopathy.  Lungs:Clear bilaterally, no wheezes, rhonci, crackles Cardiovascular: Regular rate and rhythm. No murmurs, gallops or rubs. Abdomen:Soft. Bowel sounds present. Non-tender.  Extremities: No lower extremity edema. Pulses are 2 + in the bilateral DP/PT.  EKG:  EKG is not ordered today. The ekg ordered today demonstrates   Recent Labs: 05/12/2016: B Natriuretic Peptide 258.2; TSH  2.300 06/04/2016: ALT 24; BUN 9; Creatinine, Ser 1.03; Hemoglobin 15.2; Platelets 204.0; Potassium 4.0; Sodium 131   Lipid Panel    Component Value Date/Time   CHOL 170 04/14/2015 0911   TRIG 163.0 (H) 04/14/2015 0911   TRIG 219 (HH) 02/08/2006 0757   HDL 37.60 (L) 04/14/2015 0911   CHOLHDL 5 04/14/2015 0911   VLDL 32.6 04/14/2015 0911   LDLCALC 100 (H) 04/14/2015 0911   LDLDIRECT 100.0 12/09/2014 0827     Wt Readings from Last 3 Encounters:  08/23/16 269 lb (122  kg)  06/04/16 275 lb 8 oz (125 kg)  05/21/16 270 lb 6.4 oz (122.7 kg)     Other studies Reviewed: Additional studies/ records that were reviewed today include: . Review of the above records demonstrates:   Assessment and Plan:   1. CAD without angina: He is having no chest pain suggestive of angina following recent cath with DES placement in mid Circumflex. Will continue Plavix, statin. No ASA due to use of Xarelto. No beta blocker given bradycardia. I will request records from Dr. Chalmers Cater as he reports she checked his lipids recently.   2. Atrial fibrillation, paroxysmal: He is in sinus today. Continue Xarelto. No AV nodal blocking agent due to bradycardia while on Cardizem in the hospital.   3. HTN: BP is controlled. No changes.   4. Tobacco abuse: Smoking cessation advised.     Current medicines are reviewed at length with the patient today.  The patient does not have concerns regarding medicines.  The following changes have been made:  no change  Labs/ tests ordered today include:  No orders of the defined types were placed in this encounter.  Disposition:   FU with me in 12 months  Signed, Lauree Chandler, MD 08/23/2016 9:10 AM    [address], [place], [place]  [address] Phone: [phone]; Fax: [phone]

## 2016-08-24 ENCOUNTER — Telehealth: Payer: Self-pay | Admitting: Cardiovascular Disease

## 2016-08-24 NOTE — Telephone Encounter (Signed)
ROI faxed to [facility].

## 2016-08-30 ENCOUNTER — Telehealth: Payer: Self-pay | Admitting: Cardiovascular Disease

## 2016-08-30 NOTE — Telephone Encounter (Signed)
ROI re-faxed to [facility].

## 2016-09-01 ENCOUNTER — Telehealth: Payer: Self-pay | Admitting: Cardiovascular Disease

## 2016-09-01 NOTE — Telephone Encounter (Signed)
Records rec From [facility]. Placed in [employer].

## 2016-09-02 ENCOUNTER — Other Ambulatory Visit: Payer: Self-pay | Admitting: Nurse Practitioner

## 2016-09-02 ENCOUNTER — Other Ambulatory Visit: Payer: Self-pay | Admitting: Internal Medicine

## 2016-09-02 ENCOUNTER — Ambulatory Visit
Admission: RE | Admit: 2016-09-02 | Discharge: 2016-09-02 | Disposition: A | Discharge status: Home / Self Care | Payer: Worker's Compensation | Source: Ambulatory Visit | Attending: Nurse Practitioner | Admitting: Nurse Practitioner

## 2016-09-02 DIAGNOSIS — T1490XA Injury, unspecified, initial encounter: Secondary | ICD-10-CM

## 2016-09-04 ENCOUNTER — Other Ambulatory Visit: Payer: Self-pay | Admitting: Internal Medicine

## 2016-09-07 ENCOUNTER — Ambulatory Visit (INDEPENDENT_AMBULATORY_CARE_PROVIDER_SITE_OTHER): Payer: Worker's Compensation

## 2016-09-07 ENCOUNTER — Encounter: Payer: Self-pay | Admitting: Family Medicine

## 2016-09-07 ENCOUNTER — Ambulatory Visit (INDEPENDENT_AMBULATORY_CARE_PROVIDER_SITE_OTHER): Payer: Worker's Compensation | Admitting: Family Medicine

## 2016-09-07 VITALS — BP 176/98 | HR 60 | Temp 98.6°F | Resp 16 | Ht 65.0 in | Wt 272.0 lb

## 2016-09-07 DIAGNOSIS — Y990 Civilian activity done for income or pay: Secondary | ICD-10-CM

## 2016-09-07 DIAGNOSIS — S62653A Nondisplaced fracture of medial phalanx of left middle finger, initial encounter for closed fracture: Secondary | ICD-10-CM

## 2016-09-07 DIAGNOSIS — S6000XA Contusion of unspecified finger without damage to nail, initial encounter: Secondary | ICD-10-CM

## 2016-09-07 NOTE — Patient Instructions (Addendum)
Wear splint faithfully and try to avoid any trauma to the finger.  Return in about 3-4 weeks for a recheck. Sooner if problems or concerns.  Tylenol extra strength 500 mg 2 tablets 3 times daily as needed for pain  Ice and elevate if needed for pain  Return at anytime if it looks like something is too swollen or red.  See letter regarding light duty    IF you received an x-ray today, you will receive an invoice from [facility]. Please contact [facility] at [phone] with questions or concerns regarding your invoice.   IF you received labwork today, you will receive an invoice from [place]. Please contact [facility] at [phone] with questions or concerns regarding your invoice.   Our billing staff will not be able to assist you with questions regarding bills from these companies.  You will be contacted with the lab results as soon as they are available. The fastest way to get your results is to activate your My Chart account. Instructions are located on the last page of this paperwork. If you have not heard from [place] regarding the results in 2 weeks, please contact this office.

## 2016-09-07 NOTE — Progress Notes (Signed)
Patient ID: Anthony Woods, male    DOB: 1955-11-16  Age: 61 y.o. MRN: [id number]  Chief Complaint  Patient presents with  . Hand Injury    Left hand, happened Thursday     Subjective:   61 year old man who works for the city of [place]. He had a door to an end loader closed on his left hand 5 days ago. There was pain and contusion across all 4 fingers, but worst in primarily in the middle finger. The third finger also was more severely injured. He saw a physician at the city clinic who sent him to another facility for an x-ray at [place]. Apparently there is a fracture of the proximal phalanx, though the patient states that he understood to the report to be unclear. He is been wearing a aluminum splint, and came in here today because of his Worker's Comp. status. He has been taking extra strength Tylenol for the pain.  Patient is diabetic and hypertensive.  Current allergies, medications, problem list, past/family and social histories reviewed.  Objective:  BP (!) 176/98   Pulse 60   Temp 98.6 F (37 C) (Oral)   Resp 16   Ht 5\' 5"  (1.651 m)   Wt 272 lb (123.4 kg)   SpO2 97%   BMI 45.26 kg/m   No major acute distress. No major deformity of hand. Slight ecchymosis on the distal portion of the middle finger. There is a little bit of swelling in the renal finger. It is tender primarily along the proximal phalanx. Sensation and some movement is present.  Assessment & Plan:   Assessment: 1. Work related injury   2. Contusion of finger of left hand, initial encounter   3. Closed nondisplaced fracture of middle phalanx of left middle finger, initial encounter       Plan: Will check an x-ray again.  Orders Placed This Encounter  Procedures  . DG Finger Middle Left    Standing Status:   Future    Number of Occurrences:   1    Standing Expiration Date:   09/07/2017    Order Specific Question:   Reason for Exam (SYMPTOM  OR DIAGNOSIS REQUIRED)    Answer:   work  injury, probable fracture left 3rd finger    Order Specific Question:   Preferred imaging location?    Answer:   External    No orders of the defined types were placed in this encounter.        Patient Instructions   Wear splint faithfully and try to avoid any trauma to the finger.  Return in about 3-4 weeks for a recheck. Sooner if problems or concerns.  Tylenol extra strength 500 mg 2 tablets 3 times daily as needed for pain  Ice and elevate if needed for pain  Return at anytime if it looks like something is too swollen or red.  See letter regarding light duty    IF you received an x-ray today, you will receive an invoice from [facility]. Please contact [facility] at [phone] with questions or concerns regarding your invoice.   IF you received labwork today, you will receive an invoice from [place]. Please contact [facility] at [phone] with questions or concerns regarding your invoice.   Our billing staff will not be able to assist you with questions regarding bills from these companies.  You will be contacted with the lab results as soon as they are available. The fastest way to get your results is to activate  your My Chart account. Instructions are located on the last page of this paperwork. If you have not heard from [place] regarding the results in 2 weeks, please contact this office.         Return in about 4 weeks (around 10/05/2016) for Recheck fracture.   Yamilette Garretson, MD 09/07/2016

## 2016-09-14 ENCOUNTER — Ambulatory Visit (INDEPENDENT_AMBULATORY_CARE_PROVIDER_SITE_OTHER): Payer: 59 | Admitting: Internal Medicine

## 2016-09-14 ENCOUNTER — Encounter: Payer: Self-pay | Admitting: Internal Medicine

## 2016-09-14 VITALS — BP 142/76 | HR 61 | Temp 98.1°F | Ht 65.0 in | Wt 269.0 lb

## 2016-09-14 DIAGNOSIS — I10 Essential (primary) hypertension: Secondary | ICD-10-CM

## 2016-09-14 DIAGNOSIS — Z23 Encounter for immunization: Secondary | ICD-10-CM

## 2016-09-14 DIAGNOSIS — E1159 Type 2 diabetes mellitus with other circulatory complications: Secondary | ICD-10-CM | POA: Diagnosis not present

## 2016-09-14 DIAGNOSIS — E034 Atrophy of thyroid (acquired): Secondary | ICD-10-CM

## 2016-09-14 DIAGNOSIS — I4891 Unspecified atrial fibrillation: Secondary | ICD-10-CM | POA: Diagnosis not present

## 2016-09-14 DIAGNOSIS — IMO0002 Reserved for concepts with insufficient information to code with codable children: Secondary | ICD-10-CM

## 2016-09-14 DIAGNOSIS — E1165 Type 2 diabetes mellitus with hyperglycemia: Secondary | ICD-10-CM

## 2016-09-14 DIAGNOSIS — T63441S Toxic effect of venom of bees, accidental (unintentional), sequela: Secondary | ICD-10-CM | POA: Diagnosis not present

## 2016-09-14 MED ORDER — EPINEPHRINE 0.3 MG/0.3ML IJ SOAJ: 0.3000 mg | Freq: Once | INTRAMUSCULAR | 1 refills | Status: AC

## 2016-09-14 NOTE — Assessment & Plan Note (Signed)
Levothroid 

## 2016-09-14 NOTE — Progress Notes (Signed)
Subjective:  Patient ID: Anthony Woods, male    DOB: 1956/01/21  Age: 61 y.o. MRN: [id number]  CC: No chief complaint on file.   HPI Anthony Woods presents for DM, HTN, hypothyroidism f/u He has a L 3d finger workman's comp injury  Outpatient Medications Prior to Visit  Medication Sig Dispense Refill  . atenolol-chlorthalidone (TENORETIC) 100-25 MG tablet TAKE 1 TABLET BY MOUTH DAILY. 90 tablet 3  . clopidogrel (PLAVIX) 75 MG tablet Take 1 tablet (75 mg total) by mouth daily with breakfast. 30 tablet 11  . diclofenac sodium (VOLTAREN) 1 % GEL Apply 2 g topically as needed (pain).    Marland Kitchen diphenhydrAMINE (BENADRYL) 25 mg capsule Take 25 mg by mouth as needed (bee stings).    Marland Kitchen HUMALOG MIX 75/25 KWIKPEN (75-25) 100 UNIT/ML Kwikpen Inject into the skin 2 (two) times daily with a meal. Inject 60 units into skin 2 times daily with meals. Injects 40 units at night     . levothyroxine (SYNTHROID, LEVOTHROID) 175 MCG tablet TAKE 1 TABLET (175 MCG TOTAL) BY MOUTH DAILY. 90 tablet 2  . linagliptin (TRADJENTA) 5 MG TABS tablet Take 1 tablet (5 mg total) by mouth daily. 90 tablet 3  . losartan (COZAAR) 100 MG tablet TAKE 1 TABLET BY MOUTH DAILY. 90 tablet 1  . meloxicam (MOBIC) 15 MG tablet Take 15 mg by mouth as needed for pain (knee).   0  . metFORMIN (GLUCOPHAGE) 1000 MG tablet Take 1 tablet (1,000 mg total) by mouth 2 (two) times daily with a meal. 180 tablet 3  . nitroGLYCERIN (NITROSTAT) 0.4 MG SL tablet Place 1 tablet (0.4 mg total) under the tongue every 5 (five) minutes as needed for chest pain. 25 tablet 6  . potassium chloride (K-DUR) 10 MEQ tablet Take 1 tablet (10 mEq total) by mouth 2 (two) times daily. 180 tablet 3  . rivaroxaban (XARELTO) 20 MG TABS tablet Take 1 tablet (20 mg total) by mouth daily. 30 tablet 11  . simvastatin (ZOCOR) 40 MG tablet Take 1 tablet (40 mg total) by mouth daily. Yearly physical due in January must see MD for refills 30 tablet 2  . levothyroxine (SYNTHROID,  LEVOTHROID) 175 MCG tablet TAKE 1 TABLET (175 MCG TOTAL) BY MOUTH DAILY. 90 tablet 1  . losartan (COZAAR) 100 MG tablet TAKE 1 TABLET BY MOUTH DAILY. 90 tablet 2   No facility-administered medications prior to visit.     ROS Review of Systems  Constitutional: Negative for appetite change, fatigue and unexpected weight change.  HENT: Negative for congestion, nosebleeds, sneezing, sore throat and trouble swallowing.   Eyes: Negative for itching and visual disturbance.  Respiratory: Negative for cough.   Cardiovascular: Negative for chest pain, palpitations and leg swelling.  Gastrointestinal: Negative for abdominal distention, blood in stool, diarrhea and nausea.  Genitourinary: Negative for frequency and hematuria.  Musculoskeletal: Positive for arthralgias. Negative for back pain, gait problem, joint swelling and neck pain.  Skin: Negative for rash.  Neurological: Negative for dizziness, tremors, speech difficulty and weakness.  Psychiatric/Behavioral: Negative for agitation, dysphoric mood and sleep disturbance. The patient is not nervous/anxious.     Objective:  BP (!) 142/76 (BP Location: Left Arm, Patient Position: Sitting, Cuff Size: Large)   Pulse 61   Temp 98.1 F (36.7 C) (Oral)   Ht 5\' 5"  (1.651 m)   Wt 269 lb (122 kg)   SpO2 98%   BMI 44.76 kg/m   BP Readings from Last 3 Encounters:  09/14/16 (!) 142/76  09/07/16 (!) 176/98  08/23/16 140/82    Wt Readings from Last 3 Encounters:  09/14/16 269 lb (122 kg)  09/07/16 272 lb (123.4 kg)  08/23/16 269 lb (122 kg)    Physical Exam  Constitutional: He is oriented to person, place, and time. He appears well-developed. No distress.  NAD  HENT:  Mouth/Throat: Oropharynx is clear and moist.  Eyes: Conjunctivae are normal. Pupils are equal, round, and reactive to light.  Neck: Normal range of motion. No JVD present. No thyromegaly present.  Cardiovascular: Normal rate, regular rhythm, normal heart sounds and intact  distal pulses.  Exam reveals no gallop and no friction rub.   No murmur heard. Pulmonary/Chest: Effort normal and breath sounds normal. No respiratory distress. He has no wheezes. He has no rales. He exhibits no tenderness.  Abdominal: Soft. Bowel sounds are normal. He exhibits no distension and no mass. There is no tenderness. There is no rebound and no guarding.  Musculoskeletal: Normal range of motion. He exhibits no edema or tenderness.  Lymphadenopathy:    He has no cervical adenopathy.  Neurological: He is alert and oriented to person, place, and time. He has normal reflexes. No cranial nerve deficit. He exhibits normal muscle tone. He displays a negative Romberg sign. Coordination and gait normal.  Skin: Skin is warm and dry. No rash noted.  Psychiatric: He has a normal mood and affect. His behavior is normal. Judgment and thought content normal.  Obese L 3d finger w/a splint  Dr Almetta Lovely labs 5/18  Lab Results  Component Value Date   WBC 10.0 06/04/2016   HGB 15.2 06/04/2016   HCT 44.9 06/04/2016   PLT 204.0 06/04/2016   GLUCOSE 356 (H) 06/04/2016   CHOL 170 04/14/2015   TRIG 163.0 (H) 04/14/2015   HDL 37.60 (L) 04/14/2015   LDLDIRECT 100.0 12/09/2014   LDLCALC 100 (H) 04/14/2015   ALT 24 06/04/2016   AST 21 06/04/2016   NA 131 (L) 06/04/2016   K 4.0 06/04/2016   CL 92 (L) 06/04/2016   CREATININE 1.03 06/04/2016   BUN 9 06/04/2016   CO2 33 (H) 06/04/2016   TSH 2.300 05/12/2016   PSA 0.46 04/14/2015   INR 1.12 05/12/2016   HGBA1C 10.1 (H) 06/04/2016   MICROALBUR 4.7 (H) 12/06/2013    Dg Hand Complete Left  Result Date: 09/02/2016 CLINICAL DATA:  Crush injury of the left hand today. Pain and soft tissue swelling over the PIP joint of the middle finger. EXAM: LEFT HAND - COMPLETE 3+ VIEW COMPARISON:  None in PACs FINDINGS: There is an acute fracture of the radial aspect of the base of the middle phalanx of the third finger. Less than 1 mm of distraction is seen. The  other phalanges are intact as is the metacarpal. There are chronic cystic changes in the third meta carpal head. The other digits exhibit no acute abnormalities. IMPRESSION: Minimally distracted intra-articular fracture through the radial aspect of the base of the middle phalanx of the left index finger. Electronically Signed   By: David  Martinique M.D.   On: 09/02/2016 13:49    Assessment & Plan:   There are no diagnoses linked to this encounter. I am having Mr. Townley maintain his linagliptin, metFORMIN, potassium chloride, simvastatin, diclofenac sodium, diphenhydrAMINE, meloxicam, atenolol-chlorthalidone, HUMALOG MIX 75/25 KWIKPEN, rivaroxaban, clopidogrel, nitroGLYCERIN, losartan, and levothyroxine.  No orders of the defined types were placed in this encounter.    Follow-up: No Follow-up on file.  Alex Rayvon Brandvold,  MD

## 2016-09-14 NOTE — Assessment & Plan Note (Signed)
Losartan, Tenoretic Labs

## 2016-09-14 NOTE — Assessment & Plan Note (Signed)
BP Readings from Last 3 Encounters:  09/14/16 (!) 142/76  09/07/16 (!) 176/98  08/23/16 140/82

## 2016-09-14 NOTE — Addendum Note (Signed)
Addended by: Karren Cobble on: 09/14/2016 11:39 AM   Modules accepted: Orders

## 2016-09-14 NOTE — Assessment & Plan Note (Signed)
Humalog mix, Metformin 

## 2016-09-14 NOTE — Assessment & Plan Note (Signed)
He has a kit of Rx prn - EpiPen - Rx renewed, Sudafed, Benadryl, Prednisone

## 2016-09-14 NOTE — Assessment & Plan Note (Signed)
Xarelto

## 2016-10-06 ENCOUNTER — Encounter: Payer: Self-pay | Admitting: Physician Assistant

## 2016-10-06 ENCOUNTER — Ambulatory Visit (INDEPENDENT_AMBULATORY_CARE_PROVIDER_SITE_OTHER): Payer: Worker's Compensation | Admitting: Physician Assistant

## 2016-10-06 ENCOUNTER — Ambulatory Visit (INDEPENDENT_AMBULATORY_CARE_PROVIDER_SITE_OTHER): Payer: Worker's Compensation

## 2016-10-06 VITALS — BP 146/81 | HR 95 | Temp 98.2°F | Resp 18 | Ht 66.54 in | Wt 268.8 lb

## 2016-10-06 DIAGNOSIS — S62653A Nondisplaced fracture of medial phalanx of left middle finger, initial encounter for closed fracture: Secondary | ICD-10-CM | POA: Diagnosis not present

## 2016-10-06 DIAGNOSIS — S52514A Nondisplaced fracture of right radial styloid process, initial encounter for closed fracture: Secondary | ICD-10-CM | POA: Diagnosis not present

## 2016-10-06 NOTE — Progress Notes (Signed)
MRN: [id number] DOB: [birth year]  Subjective:   Anthony Woods is a 61 y.o. male presenting for chief complaint of Finger Injury ( W/C- 4 week f/u for Recheck fracture) Pt initially seen in our clinic by Dr. Linna Darner on 09/07/16 for left middle finger injury. He had a door to an end loader close on his left hand five days prior to being seen by Dr. Linna Darner. He had seen a physician at another facility prior to coming here and had an xray at [place]. He could not remember what the report said so additional films were obtained in office. On 09/07/16, imaging of the left middle finger showed nondisplaced fracture at the base of the left middle finger middle phalanx. Pt was instructed to wear splint faithfully, avoid trauma to finger, take tylenol extra strength for pain, ice, elevate, and return to office for recheck in 3-4 weeks.   Today, he reports he admits good compliance to treatment plan. Has worn the splint daily. Has used tylenol as needed. Has iced and elevated the finger occasionally. Notes the pain has significantly improved. Has occasionally numbness sensation, which resolves when he loosens the splint. Still having some swelling. Notes he also has had some pain at the base of the 4th digit that has not resolved since the initial injury. He has not attempted any ROM exercises with fractured finger.   Romey's medications list, allergies, past medical history and past surgical history were reviewed and excluded from this note due to being a worker's comp case.   Objective:   Vitals: BP (!) 146/81 (BP Location: Right Arm, Patient Position: Sitting, Cuff Size: Large)   Pulse 95   Temp 98.2 F (36.8 C) (Oral)   Resp 18   Ht 5' 6.53" (1.69 m)   Wt 268 lb 12.8 oz (121.9 kg)   SpO2 95%   BMI 42.69 kg/m   Physical Exam  Constitutional: He is oriented to person, place, and time. He appears well-developed and well-nourished.  HENT:  Head: Normocephalic and atraumatic.  Eyes:  Conjunctivae are normal.  Neck: Normal range of motion.  Pulmonary/Chest: Effort normal.  Musculoskeletal:       Left hand: He exhibits decreased range of motion (with flexion of 3rd digit), tenderness (mild tenderness with palpation of base of 3rd middle phalanx and with palpation of 4th MCP joint) and swelling (of 3rd digit ). He exhibits normal capillary refill. Normal sensation noted.  Neurological: He is alert and oriented to person, place, and time.  Skin: Skin is warm and dry.  Psychiatric: He has a normal mood and affect.  Vitals reviewed.   No results found for this or any previous visit (from the past 24 hour(s)).   Dg Hand Complete Left  Result Date: 10/06/2016 CLINICAL DATA:  Improving pain four weeks status post nondisplaced base of left third middle phalanx fracture. Persistent left fourth MCP joint pain. EXAM: LEFT HAND - COMPLETE 3+ VIEW COMPARISON:  09/02/2016 left hand radiographs. FINDINGS: Nondisplaced intra-articular fracture at the radial base of the middle phalanx in the left third finger, with slight bone resorption at the fracture site likely indicating early healing response. No additional fracture. No dislocation. No suspicious focal osseous lesion. 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 No radiopaque foreign body. IMPRESSION: 1. Nondisplaced intra-articular fracture at the base of the middle phalanx in the left third finger, with probable early healing response. 2. No additional fracture. 3. Mild left third MCP joint osteoarthritis. Electronically Signed  By: Ilona Sorrel M.D.   On: 10/06/2016 14:54     Assessment and Plan :  1. Closed nondisplaced fracture of middle phalanx of left middle finger, initial encounter Due to plain film findings of only probable early healing at 4 weeks post injury, pt warrants orthopedic referral. We will d/c splint at this time and have applied buddy tape in office. Recommended to start ROM exercises in finger  as tolerated. Follow up with ortho.  - DG Hand Complete Left; Future - Ambulatory referral to [place], PA-C  [facility] [place] 10/06/2016 6:15 PM

## 2016-10-06 NOTE — Patient Instructions (Addendum)
Your xray shows probable early healing response at the middle phalanx, which we would expect to have more of a healing response at this time. This could be due to the fact that it has been immobilized for the past 4 weeks. I would like you to start wearing the buddy tape daily and removing it at night while asleep. During the day, start doing finger exercises as tolerated (basically just bending the middle finger to a point of discomfort, not pain). I have put in a referral to orthopedics so you can follow up with them so they can ensure proper healing. They should contact you within the next week for an appointment. If you have not heard from them in one week, call our office and let me know. There was no fracture noted on the 4th digit, which is great news. I recommend also using ice and elevation for swelling. Use tylenol as needed for pain. Thank you for letting me participate in your health and well being.   Finger Fracture A finger fracture is a break in any of the bones of the fingers. What are the causes? The main cause of finger fractures is traumatic injury, such as from:  An injury while playing sports.  A workplace injury.  A fall.  What increases the risk? Activities that can increase your risk of finger fractures include:  Sports.  Workplace activities that involve machinery.  A condition called osteoporosis, which can make your bones less dense and cause them to fracture more easily.  What are the signs or symptoms? The main symptoms of a broken finger are pain, bruising, and swelling shortly after the injury. Other symptoms include:  Bruising of your finger.  Stiffness of your finger.  Exposed bones (compound or open fracture) if the fracture is severe.  How is this diagnosed? This condition is diagnosed based on a physical exam, your medical history, and your symptoms. An X-ray will also be done. How is this treated? Treatment for this condition depends on the  severity of the fracture. If the bones are still in place, the finger may be splinted to keep the finger still while it heals (immobilization). If the bones are out of place, they will need to be put back into place. This may be done without surgery, or surgery may be needed. You may also be given exercises to do to regain strength and flexibility (physical therapy). Follow these instructions at home: If you have a splint:  Wear the splint as told by your health care provider. Remove it only as told by your health care provider.  Loosen the splint if your fingers tingle, become numb, or turn cold and blue.  Keep the splint clean.  If the splint is not waterproof: ? Do not let it get wet. ? Cover it with a watertight covering when you take a bath or a shower. Managing pain, stiffness, and swelling  If directed, put ice on the injured area: ? If you have a removable splint, remove it as told by your health care provider. ? Put ice in a plastic bag. ? Place a towel between your skin and the bag. ? Leave the ice on for 20 minutes, 2-3 times a day.  Move your fingers often to avoid stiffness and to lessen swelling.  Raise (elevate) the injured area above the level of your heart while you are sitting or lying down. Driving  Do not drive or use heavy machinery while taking prescription pain medicine.  Ask your  health care provider when it is safe to drive if you have a splint. General instructions  Do not put pressure on any part of the splint until it is fully hardened. This may take several hours.  Do not use any products that contain nicotine or tobacco, such as cigarettes and e-cigarettes. These can delay bone healing. If you need help quitting, ask your health care provider.  Take over-the-counter and prescription medicines only as told by your health care provider.  Do exercises as told by your health care provider.  Keep all follow-up visits as told by your health care  provider. This is important. Contact a health care provider if:  Your pain or swelling gets worse even with treatment.  You have trouble moving your finger. Get help right away if:  Your finger becomes numb or blue. Summary  A finger fracture is a break in any of the bones of the fingers.  Traumatic injury is the main cause of finger fractures.  Treatment for this condition depends on the severity of the fracture. This information is not intended to replace advice given to you by your health care provider. Make sure you discuss any questions you have with your health care provider. Document Released: 06/20/2000 Document Revised: 01/27/2016 Document Reviewed: 01/27/2016 Elsevier Interactive Patient Education  2017 [employer].    IF you received an x-ray today, you will receive an invoice from [facility]. Please contact [facility] at [phone] with questions or concerns regarding your invoice.   IF you received labwork today, you will receive an invoice from [place]. Please contact [facility] at [phone] with questions or concerns regarding your invoice.   Our billing staff will not be able to assist you with questions regarding bills from these companies.  You will be contacted with the lab results as soon as they are available. The fastest way to get your results is to activate your My Chart account. Instructions are located on the last page of this paperwork. If you have not heard from [place] regarding the results in 2 weeks, please contact this office.

## 2016-10-14 ENCOUNTER — Other Ambulatory Visit: Payer: Self-pay | Admitting: Internal Medicine

## 2016-11-19 ENCOUNTER — Other Ambulatory Visit: Payer: Self-pay | Admitting: Endocrinology

## 2016-12-15 ENCOUNTER — Encounter: Payer: Self-pay | Admitting: Internal Medicine

## 2016-12-15 ENCOUNTER — Ambulatory Visit (INDEPENDENT_AMBULATORY_CARE_PROVIDER_SITE_OTHER): Payer: 59 | Admitting: Internal Medicine

## 2016-12-15 VITALS — BP 126/84 | HR 62 | Temp 98.0°F | Ht 66.5 in | Wt 268.0 lb

## 2016-12-15 DIAGNOSIS — Z566 Other physical and mental strain related to work: Secondary | ICD-10-CM

## 2016-12-15 DIAGNOSIS — E1165 Type 2 diabetes mellitus with hyperglycemia: Secondary | ICD-10-CM

## 2016-12-15 DIAGNOSIS — I10 Essential (primary) hypertension: Secondary | ICD-10-CM

## 2016-12-15 DIAGNOSIS — E1159 Type 2 diabetes mellitus with other circulatory complications: Secondary | ICD-10-CM | POA: Diagnosis not present

## 2016-12-15 DIAGNOSIS — F17200 Nicotine dependence, unspecified, uncomplicated: Secondary | ICD-10-CM | POA: Diagnosis not present

## 2016-12-15 DIAGNOSIS — Z23 Encounter for immunization: Secondary | ICD-10-CM

## 2016-12-15 DIAGNOSIS — I4891 Unspecified atrial fibrillation: Secondary | ICD-10-CM | POA: Diagnosis not present

## 2016-12-15 DIAGNOSIS — IMO0002 Reserved for concepts with insufficient information to code with codable children: Secondary | ICD-10-CM

## 2016-12-15 DIAGNOSIS — J209 Acute bronchitis, unspecified: Secondary | ICD-10-CM | POA: Diagnosis not present

## 2016-12-15 MED ORDER — ESCITALOPRAM OXALATE 5 MG PO TABS: 5.0000 mg | ORAL_TABLET | Freq: Every day | ORAL | 5 refills | Status: DC

## 2016-12-15 MED ORDER — RIVAROXABAN 20 MG PO TABS: 20.0000 mg | ORAL_TABLET | Freq: Every day | ORAL | 11 refills | Status: DC

## 2016-12-15 MED ORDER — LOSARTAN POTASSIUM 100 MG PO TABS: 100.0000 mg | ORAL_TABLET | Freq: Every day | ORAL | 11 refills | Status: DC

## 2016-12-15 MED ORDER — CLOPIDOGREL BISULFATE 75 MG PO TABS: 75.0000 mg | ORAL_TABLET | Freq: Every day | ORAL | 11 refills | Status: DC

## 2016-12-15 MED ORDER — ATENOLOL-CHLORTHALIDONE 100-25 MG PO TABS: 1.0000 | ORAL_TABLET | Freq: Every day | ORAL | 11 refills | Status: DC

## 2016-12-15 MED ORDER — SIMVASTATIN 40 MG PO TABS: 40.0000 mg | ORAL_TABLET | Freq: Every day | ORAL | 11 refills | Status: DC

## 2016-12-15 MED ORDER — LEVOTHYROXINE SODIUM 175 MCG PO TABS: 175.0000 ug | ORAL_TABLET | Freq: Every day | ORAL | 11 refills | Status: DC

## 2016-12-15 MED ORDER — CEFUROXIME AXETIL 250 MG PO TABS: 250.0000 mg | ORAL_TABLET | Freq: Two times a day (BID) | ORAL | 0 refills | Status: AC

## 2016-12-15 NOTE — Progress Notes (Signed)
Subjective:  Patient ID: Anthony Woods, male    DOB: 13-Nov-1955  Age: 61 y.o. MRN: [id number]  CC: No chief complaint on file.   HPI Anthony Woods presents for stress C/o more cough: smoking 2 ppd C/o face muscles twitching  Outpatient Medications Prior to Visit  Medication Sig Dispense Refill  . atenolol-chlorthalidone (TENORETIC) 100-25 MG tablet TAKE 1 TABLET BY MOUTH DAILY. 90 tablet 3  . clopidogrel (PLAVIX) 75 MG tablet Take 1 tablet (75 mg total) by mouth daily with breakfast. 30 tablet 11  . diphenhydrAMINE (BENADRYL) 25 mg capsule Take 25 mg by mouth as needed (bee stings).    Marland Kitchen HUMALOG MIX 75/25 KWIKPEN (75-25) 100 UNIT/ML Kwikpen Inject into the skin 2 (two) times daily with a meal. Inject 60 units into skin 2 times daily with meals. Injects 40 units at night     . Insulin Pen Needle (ULTICARE MICRO PEN NEEDLES) 32G X 4 MM MISC use with insulin pen to inject insulin 2x daily. 100 each PRN  . levothyroxine (SYNTHROID, LEVOTHROID) 175 MCG tablet TAKE 1 TABLET (175 MCG TOTAL) BY MOUTH DAILY. 90 tablet 2  . linagliptin (TRADJENTA) 5 MG TABS tablet Take 1 tablet (5 mg total) by mouth daily. 90 tablet 3  . losartan (COZAAR) 100 MG tablet TAKE 1 TABLET BY MOUTH DAILY. 90 tablet 1  . metFORMIN (GLUCOPHAGE) 1000 MG tablet Take 1 tablet (1,000 mg total) by mouth 2 (two) times daily with a meal. 180 tablet 3  . nitroGLYCERIN (NITROSTAT) 0.4 MG SL tablet Place 1 tablet (0.4 mg total) under the tongue every 5 (five) minutes as needed for chest pain. 25 tablet 6  . potassium chloride (K-DUR) 10 MEQ tablet TAKE 1 TABLET BY MOUTH 2 TIMES DAILY. 60 tablet 6  . rivaroxaban (XARELTO) 20 MG TABS tablet Take 1 tablet (20 mg total) by mouth daily. 30 tablet 11  . simvastatin (ZOCOR) 40 MG tablet Take 1 tablet (40 mg total) by mouth daily. Yearly physical due in January must see MD for refills 30 tablet 2  . diclofenac sodium (VOLTAREN) 1 % GEL Apply 2 g topically as needed (pain).    . meloxicam  (MOBIC) 15 MG tablet Take 15 mg by mouth as needed for pain (knee).   0  . potassium chloride (K-DUR) 10 MEQ tablet Take 1 tablet (10 mEq total) by mouth 2 (two) times daily. 180 tablet 3   No facility-administered medications prior to visit.     ROS Review of Systems  Constitutional: Positive for fatigue. Negative for appetite change and unexpected weight change.  HENT: Negative for congestion, nosebleeds, sneezing, sore throat and trouble swallowing.   Eyes: Negative for itching and visual disturbance.  Respiratory: Positive for cough.   Cardiovascular: Negative for chest pain, palpitations and leg swelling.  Gastrointestinal: Negative for abdominal distention, blood in stool, diarrhea and nausea.  Genitourinary: Negative for frequency and hematuria.  Musculoskeletal: Negative for back pain, gait problem, joint swelling and neck pain.  Skin: Negative for rash.  Neurological: Positive for headaches. Negative for dizziness, tremors, speech difficulty and weakness.  Psychiatric/Behavioral: Positive for sleep disturbance. Negative for agitation and dysphoric mood. The patient is nervous/anxious.     Objective:  BP 126/84 (BP Location: Left Arm, Patient Position: Sitting, Cuff Size: Large)   Pulse 62   Temp 98 F (36.7 C) (Oral)   Ht 5' 6.5" (1.689 m)   Wt 268 lb (121.6 kg)   SpO2 98%   BMI 42.61  kg/m   BP Readings from Last 3 Encounters:  12/15/16 126/84  10/06/16 (!) 146/81  09/14/16 (!) 142/76    Wt Readings from Last 3 Encounters:  12/15/16 268 lb (121.6 kg)  10/06/16 268 lb 12.8 oz (121.9 kg)  09/14/16 269 lb (122 kg)    Physical Exam  Constitutional: He is oriented to person, place, and time. He appears well-developed. No distress.  NAD  HENT:  Mouth/Throat: Oropharynx is clear and moist.  Eyes: Pupils are equal, round, and reactive to light. Conjunctivae are normal.  Neck: Normal range of motion. No JVD present. No thyromegaly present.  Cardiovascular: Normal  rate, regular rhythm, normal heart sounds and intact distal pulses.  Exam reveals no gallop and no friction rub.   No murmur heard. Pulmonary/Chest: Effort normal and breath sounds normal. No respiratory distress. He has no wheezes. He has no rales. He exhibits no tenderness.  Abdominal: Soft. Bowel sounds are normal. He exhibits no distension and no mass. There is tenderness. There is no rebound and no guarding.  Musculoskeletal: Normal range of motion. He exhibits no edema or tenderness.  Lymphadenopathy:    He has no cervical adenopathy.  Neurological: He is alert and oriented to person, place, and time. He has normal reflexes. No cranial nerve deficit. He exhibits normal muscle tone. He displays a negative Romberg sign. Coordination and gait normal.  Skin: Skin is warm and dry. No rash noted.  Psychiatric: He has a normal mood and affect. His behavior is normal. Judgment and thought content normal.    Lab Results  Component Value Date   WBC 10.0 06/04/2016   HGB 15.2 06/04/2016   HCT 44.9 06/04/2016   PLT 204.0 06/04/2016   GLUCOSE 356 (H) 06/04/2016   CHOL 170 04/14/2015   TRIG 163.0 (H) 04/14/2015   HDL 37.60 (L) 04/14/2015   LDLDIRECT 100.0 12/09/2014   LDLCALC 100 (H) 04/14/2015   ALT 24 06/04/2016   AST 21 06/04/2016   NA 131 (L) 06/04/2016   K 4.0 06/04/2016   CL 92 (L) 06/04/2016   CREATININE 1.03 06/04/2016   BUN 9 06/04/2016   CO2 33 (H) 06/04/2016   TSH 2.300 05/12/2016   PSA 0.46 04/14/2015   INR 1.12 05/12/2016   HGBA1C 10.1 (H) 06/04/2016   MICROALBUR 4.7 (H) 12/06/2013    Dg Hand Complete Left  Result Date: 09/02/2016 CLINICAL DATA:  Crush injury of the left hand today. Pain and soft tissue swelling over the PIP joint of the middle finger. EXAM: LEFT HAND - COMPLETE 3+ VIEW COMPARISON:  None in PACs FINDINGS: There is an acute fracture of the radial aspect of the base of the middle phalanx of the third finger. Less than 1 mm of distraction is seen. The other  phalanges are intact as is the metacarpal. There are chronic cystic changes in the third meta carpal head. The other digits exhibit no acute abnormalities. IMPRESSION: Minimally distracted intra-articular fracture through the radial aspect of the base of the middle phalanx of the left index finger. Electronically Signed   By: David  Martinique M.D.   On: 09/02/2016 13:49    Assessment & Plan:   There are no diagnoses linked to this encounter. I have discontinued Mr. Achey's diclofenac sodium and meloxicam. I am also having him maintain his linagliptin, metFORMIN, simvastatin, diphenhydrAMINE, atenolol-chlorthalidone, HUMALOG MIX 75/25 KWIKPEN, rivaroxaban, clopidogrel, nitroGLYCERIN, losartan, levothyroxine, potassium chloride, and Insulin Pen Needle.  No orders of the defined types were placed in this encounter.  Follow-up: No Follow-up on file.  Walker Kehr, MD

## 2016-12-15 NOTE — Assessment & Plan Note (Signed)
F/u w/Dr Chalmers Cater this week

## 2016-12-15 NOTE — Assessment & Plan Note (Signed)
Losartan, Tenoretic 

## 2016-12-15 NOTE — Assessment & Plan Note (Signed)
Lexapro qd low dose

## 2016-12-15 NOTE — Addendum Note (Signed)
Addended by: Karren Cobble on: 12/15/2016 10:04 AM   Modules accepted: Orders

## 2016-12-15 NOTE — Assessment & Plan Note (Signed)
Ceftin po

## 2016-12-15 NOTE — Assessment & Plan Note (Signed)
9/18 2 ppd Discussed

## 2016-12-15 NOTE — Assessment & Plan Note (Signed)
On Xarelto 

## 2016-12-17 DIAGNOSIS — E039 Hypothyroidism, unspecified: Secondary | ICD-10-CM | POA: Diagnosis not present

## 2016-12-17 DIAGNOSIS — E1165 Type 2 diabetes mellitus with hyperglycemia: Secondary | ICD-10-CM | POA: Diagnosis not present

## 2016-12-17 DIAGNOSIS — I10 Essential (primary) hypertension: Secondary | ICD-10-CM | POA: Diagnosis not present

## 2016-12-23 DIAGNOSIS — E1165 Type 2 diabetes mellitus with hyperglycemia: Secondary | ICD-10-CM | POA: Diagnosis not present

## 2017-01-18 ENCOUNTER — Ambulatory Visit (INDEPENDENT_AMBULATORY_CARE_PROVIDER_SITE_OTHER): Payer: 59 | Admitting: General Practice

## 2017-01-18 DIAGNOSIS — Z23 Encounter for immunization: Secondary | ICD-10-CM | POA: Diagnosis not present

## 2017-01-25 ENCOUNTER — Other Ambulatory Visit: Payer: Self-pay | Admitting: Internal Medicine

## 2017-02-22 ENCOUNTER — Other Ambulatory Visit: Payer: Self-pay | Admitting: Internal Medicine

## 2017-03-16 ENCOUNTER — Encounter: Payer: Self-pay | Admitting: Internal Medicine

## 2017-03-16 ENCOUNTER — Other Ambulatory Visit (INDEPENDENT_AMBULATORY_CARE_PROVIDER_SITE_OTHER): Payer: 59

## 2017-03-16 ENCOUNTER — Ambulatory Visit: Payer: 59 | Admitting: Internal Medicine

## 2017-03-16 ENCOUNTER — Other Ambulatory Visit: Payer: 59

## 2017-03-16 VITALS — BP 132/78 | HR 58 | Temp 98.2°F | Ht 66.5 in | Wt 260.0 lb

## 2017-03-16 DIAGNOSIS — I4891 Unspecified atrial fibrillation: Secondary | ICD-10-CM | POA: Diagnosis not present

## 2017-03-16 DIAGNOSIS — E034 Atrophy of thyroid (acquired): Secondary | ICD-10-CM | POA: Diagnosis not present

## 2017-03-16 DIAGNOSIS — Z23 Encounter for immunization: Secondary | ICD-10-CM

## 2017-03-16 DIAGNOSIS — E119 Type 2 diabetes mellitus without complications: Secondary | ICD-10-CM | POA: Diagnosis not present

## 2017-03-16 DIAGNOSIS — Z566 Other physical and mental strain related to work: Secondary | ICD-10-CM

## 2017-03-16 DIAGNOSIS — E1165 Type 2 diabetes mellitus with hyperglycemia: Secondary | ICD-10-CM

## 2017-03-16 DIAGNOSIS — I10 Essential (primary) hypertension: Secondary | ICD-10-CM

## 2017-03-16 LAB — BASIC METABOLIC PANEL
BUN: 11 mg/dL (ref 6–23)
CHLORIDE: 99 meq/L (ref 96–112)
CO2: 31 meq/L (ref 19–32)
CREATININE: 0.93 mg/dL (ref 0.40–1.50)
Calcium: 9.8 mg/dL (ref 8.4–10.5)
GFR: 87.89 mL/min (ref >60.00)
Glucose, Bld: 138 mg/dL — ABNORMAL HIGH (ref 70–99)
Potassium: 3.5 mEq/L (ref 3.5–5.1)
Sodium: 137 mEq/L (ref 135–145)

## 2017-03-16 LAB — HEMOGLOBIN A1C: Hgb A1c MFr Bld: 9.1 % — ABNORMAL HIGH (ref 4.6–6.5)

## 2017-03-16 NOTE — Assessment & Plan Note (Signed)
Levothroid 

## 2017-03-16 NOTE — Assessment & Plan Note (Signed)
Stress is better on Lexapro. Lost wt

## 2017-03-16 NOTE — Addendum Note (Signed)
Addended by: Karren Cobble on: 03/16/2017 08:45 AM   Modules accepted: Orders

## 2017-03-16 NOTE — Assessment & Plan Note (Signed)
Humalog mix, Metformin

## 2017-03-16 NOTE — Assessment & Plan Note (Signed)
Lost 8 lbs

## 2017-03-16 NOTE — Assessment & Plan Note (Signed)
Losartan, Tenoretic 

## 2017-03-16 NOTE — Assessment & Plan Note (Signed)
Xarelto

## 2017-03-16 NOTE — Progress Notes (Signed)
Subjective:  Patient ID: Anthony Woods, male    DOB: 03/05/1956  Age: 61 y.o. MRN: [id number]  CC: No chief complaint on file.   HPI Anthony Woods presents for CAD, HTN, anxiety f/u. Stress is better on Lexapro. Lost wt  Outpatient Medications Prior to Visit  Medication Sig Dispense Refill  . atenolol-chlorthalidone (TENORETIC) 100-25 MG tablet Take 1 tablet by mouth daily. 30 tablet 11  . clopidogrel (PLAVIX) 75 MG tablet Take 1 tablet (75 mg total) by mouth daily with breakfast. 30 tablet 11  . diphenhydrAMINE (BENADRYL) 25 mg capsule Take 25 mg by mouth as needed (bee stings).    Marland Kitchen escitalopram (LEXAPRO) 5 MG tablet Take 1 tablet (5 mg total) by mouth daily. 30 tablet 5  . HUMALOG MIX 75/25 KWIKPEN (75-25) 100 UNIT/ML Kwikpen Inject into the skin 2 (two) times daily with a meal. Inject 60 units into skin 2 times daily with meals. Injects 40 units at night     . Insulin Pen Needle (ULTICARE MICRO PEN NEEDLES) 32G X 4 MM MISC use with insulin pen to inject insulin 2x daily. 100 each PRN  . levothyroxine (SYNTHROID, LEVOTHROID) 175 MCG tablet TAKE 1 TABLET (175 MCG TOTAL) BY MOUTH DAILY. 90 tablet 1  . linagliptin (TRADJENTA) 5 MG TABS tablet Take 1 tablet (5 mg total) by mouth daily. 90 tablet 3  . losartan (COZAAR) 100 MG tablet TAKE 1 TABLET BY MOUTH DAILY. 90 tablet 1  . metFORMIN (GLUCOPHAGE) 1000 MG tablet Take 1 tablet (1,000 mg total) by mouth 2 (two) times daily with a meal. 180 tablet 3  . nitroGLYCERIN (NITROSTAT) 0.4 MG SL tablet Place 1 tablet (0.4 mg total) under the tongue every 5 (five) minutes as needed for chest pain. 25 tablet 6  . potassium chloride (K-DUR) 10 MEQ tablet TAKE 1 TABLET BY MOUTH 2 TIMES DAILY. 60 tablet 6  . rivaroxaban (XARELTO) 20 MG TABS tablet Take 1 tablet (20 mg total) by mouth daily. 30 tablet 11  . simvastatin (ZOCOR) 40 MG tablet Take 1 tablet (40 mg total) by mouth daily. 30 tablet 11  . levothyroxine (SYNTHROID, LEVOTHROID) 175 MCG tablet  Take 1 tablet (175 mcg total) by mouth daily. 30 tablet 11  . losartan (COZAAR) 100 MG tablet Take 1 tablet (100 mg total) by mouth daily. 30 tablet 11   No facility-administered medications prior to visit.     ROS Review of Systems  Constitutional: Negative for appetite change, fatigue and unexpected weight change.  HENT: Negative for congestion, nosebleeds, sneezing, sore throat and trouble swallowing.   Eyes: Negative for itching and visual disturbance.  Respiratory: Negative for cough.   Cardiovascular: Negative for chest pain, palpitations and leg swelling.  Gastrointestinal: Negative for abdominal distention, blood in stool, diarrhea and nausea.  Genitourinary: Negative for frequency and hematuria.  Musculoskeletal: Negative for back pain, gait problem, joint swelling and neck pain.  Skin: Negative for rash.  Neurological: Negative for dizziness, tremors, speech difficulty and weakness.  Psychiatric/Behavioral: Negative for agitation, dysphoric mood and sleep disturbance. The patient is not nervous/anxious.     Objective:  BP 132/78 (BP Location: Left Arm, Patient Position: Sitting, Cuff Size: Large)   Pulse (!) 58   Temp 98.2 F (36.8 C) (Oral)   Ht 5' 6.5" (1.689 m)   Wt 260 lb (117.9 kg)   SpO2 98%   BMI 41.34 kg/m   BP Readings from Last 3 Encounters:  03/16/17 132/78  12/15/16 126/84  10/06/16 Marland Kitchen)  146/81    Wt Readings from Last 3 Encounters:  03/16/17 260 lb (117.9 kg)  12/15/16 268 lb (121.6 kg)  10/06/16 268 lb 12.8 oz (121.9 kg)    Physical Exam  Constitutional: He is oriented to person, place, and time. He appears well-developed. No distress.  NAD  HENT:  Mouth/Throat: Oropharynx is clear and moist.  Eyes: Conjunctivae are normal. Pupils are equal, round, and reactive to light.  Neck: Normal range of motion. No JVD present. No thyromegaly present.  Cardiovascular: Normal rate, regular rhythm, normal heart sounds and intact distal pulses. Exam reveals  no gallop and no friction rub.  No murmur heard. Pulmonary/Chest: Effort normal and breath sounds normal. No respiratory distress. He has no wheezes. He has no rales. He exhibits no tenderness.  Abdominal: Soft. Bowel sounds are normal. He exhibits no distension and no mass. There is no tenderness. There is no rebound and no guarding.  Musculoskeletal: Normal range of motion. He exhibits tenderness. He exhibits no edema.  Lymphadenopathy:    He has no cervical adenopathy.  Neurological: He is alert and oriented to person, place, and time. He has normal reflexes. No cranial nerve deficit. He exhibits normal muscle tone. He displays a negative Romberg sign. Coordination and gait normal.  Skin: Skin is warm and dry. No rash noted.  Psychiatric: He has a normal mood and affect. His behavior is normal. Judgment and thought content normal.  obese  Lab Results  Component Value Date   WBC 10.0 06/04/2016   HGB 15.2 06/04/2016   HCT 44.9 06/04/2016   PLT 204.0 06/04/2016   GLUCOSE 356 (H) 06/04/2016   CHOL 170 04/14/2015   TRIG 163.0 (H) 04/14/2015   HDL 37.60 (L) 04/14/2015   LDLDIRECT 100.0 12/09/2014   LDLCALC 100 (H) 04/14/2015   ALT 24 06/04/2016   AST 21 06/04/2016   NA 131 (L) 06/04/2016   K 4.0 06/04/2016   CL 92 (L) 06/04/2016   CREATININE 1.03 06/04/2016   BUN 9 06/04/2016   CO2 33 (H) 06/04/2016   TSH 2.300 05/12/2016   PSA 0.46 04/14/2015   INR 1.12 05/12/2016   HGBA1C 10.1 (H) 06/04/2016   MICROALBUR 4.7 (H) 12/06/2013    Dg Hand Complete Left  Result Date: 09/02/2016 CLINICAL DATA:  Crush injury of the left hand today. Pain and soft tissue swelling over the PIP joint of the middle finger. EXAM: LEFT HAND - COMPLETE 3+ VIEW COMPARISON:  None in PACs FINDINGS: There is an acute fracture of the radial aspect of the base of the middle phalanx of the third finger. Less than 1 mm of distraction is seen. The other phalanges are intact as is the metacarpal. There are chronic  cystic changes in the third meta carpal head. The other digits exhibit no acute abnormalities. IMPRESSION: Minimally distracted intra-articular fracture through the radial aspect of the base of the middle phalanx of the left index finger. Electronically Signed   By: David  Martinique M.D.   On: 09/02/2016 13:49    Assessment & Plan:   There are no diagnoses linked to this encounter. I am having Anthony Woods maintain his linagliptin, metFORMIN, diphenhydrAMINE, HUMALOG MIX 75/25 KWIKPEN, nitroGLYCERIN, potassium chloride, Insulin Pen Needle, escitalopram, atenolol-chlorthalidone, clopidogrel, rivaroxaban, simvastatin, levothyroxine, and losartan.  No orders of the defined types were placed in this encounter.    Follow-up: No Follow-up on file.  Walker Kehr, MD

## 2017-03-21 ENCOUNTER — Ambulatory Visit: Payer: Self-pay

## 2017-04-26 DIAGNOSIS — E039 Hypothyroidism, unspecified: Secondary | ICD-10-CM | POA: Diagnosis not present

## 2017-04-26 DIAGNOSIS — E1165 Type 2 diabetes mellitus with hyperglycemia: Secondary | ICD-10-CM | POA: Diagnosis not present

## 2017-04-26 DIAGNOSIS — E7800 Pure hypercholesterolemia, unspecified: Secondary | ICD-10-CM | POA: Diagnosis not present

## 2017-04-26 DIAGNOSIS — I10 Essential (primary) hypertension: Secondary | ICD-10-CM | POA: Diagnosis not present

## 2017-04-29 ENCOUNTER — Other Ambulatory Visit: Payer: Self-pay | Admitting: Internal Medicine

## 2017-04-29 NOTE — Telephone Encounter (Signed)
Routing to dr plotnikov, please advise, thanks 

## 2017-07-01 ENCOUNTER — Other Ambulatory Visit: Payer: Self-pay | Admitting: Internal Medicine

## 2017-07-24 IMAGING — DX DG HAND COMPLETE 3+V*L*
3 series · 3 of 3 positions shown · non-contrast
Comparison: 09/02/2016 left hand radiographs.

CLINICAL DATA: Improving pain four weeks status post nondisplaced
base of left third middle phalanx fracture. Persistent left fourth
MCP joint pain.

EXAM:
LEFT HAND - COMPLETE 3+ VIEW

[hand pa]
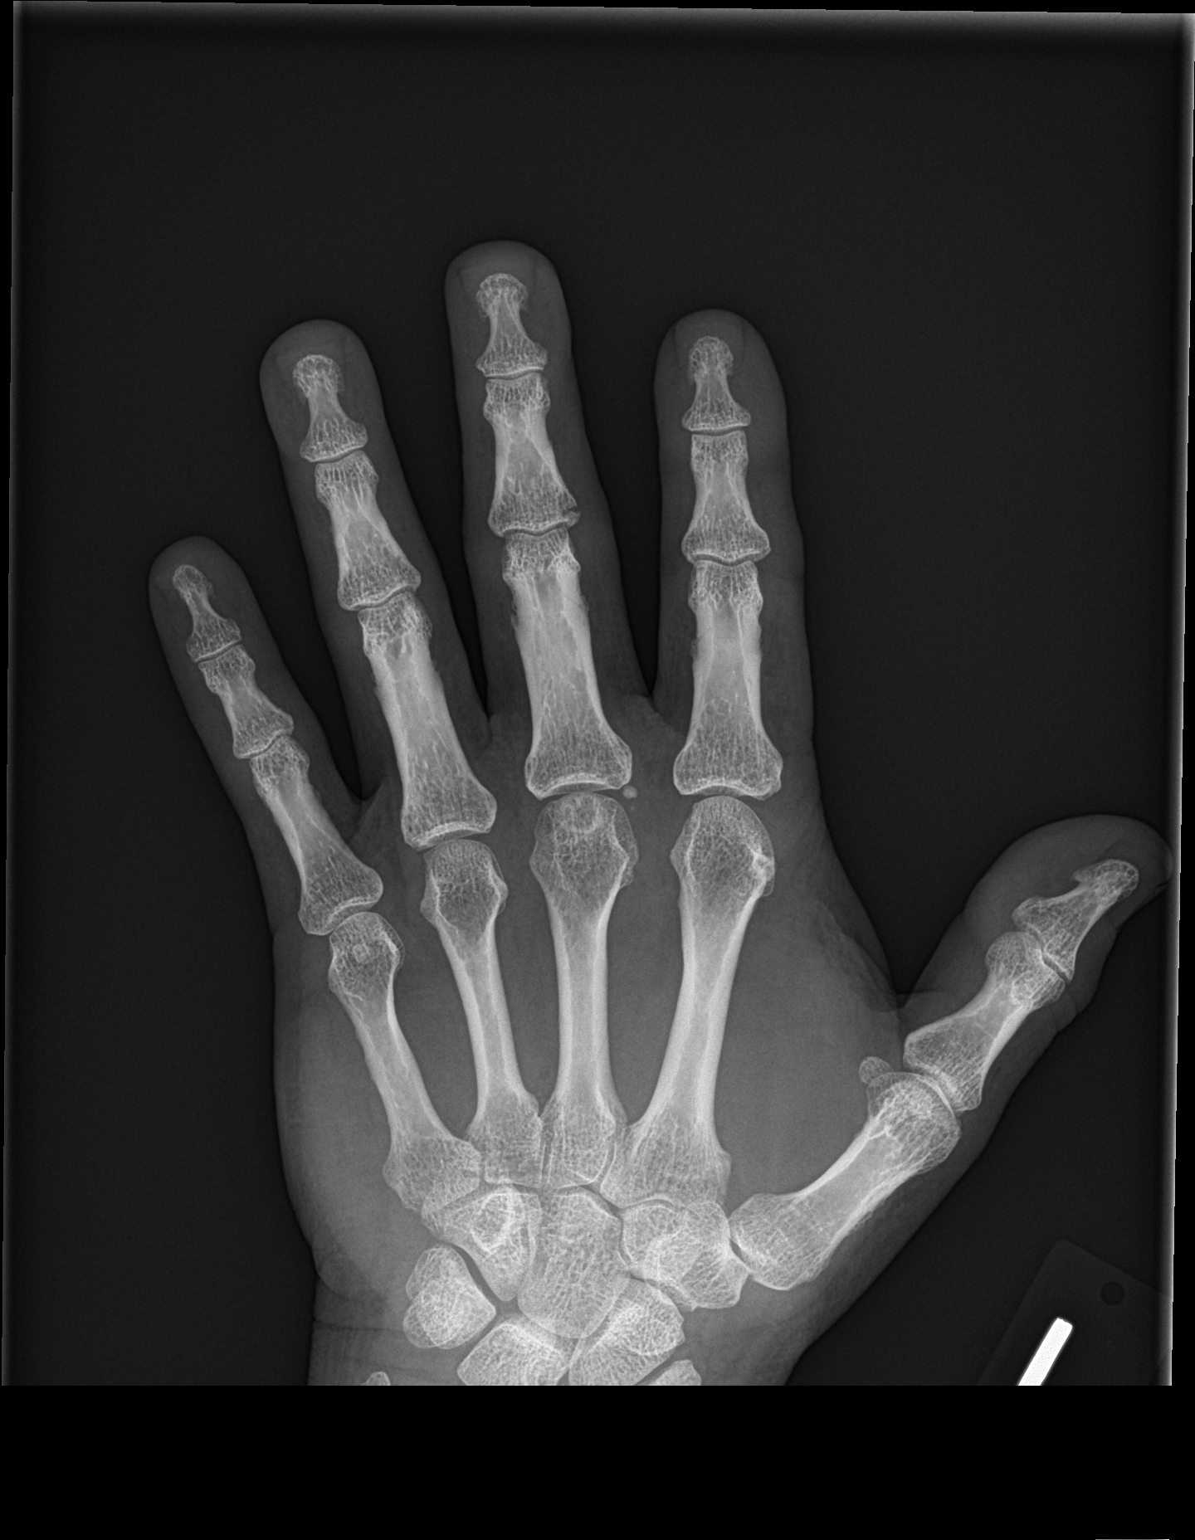

[hand obl]
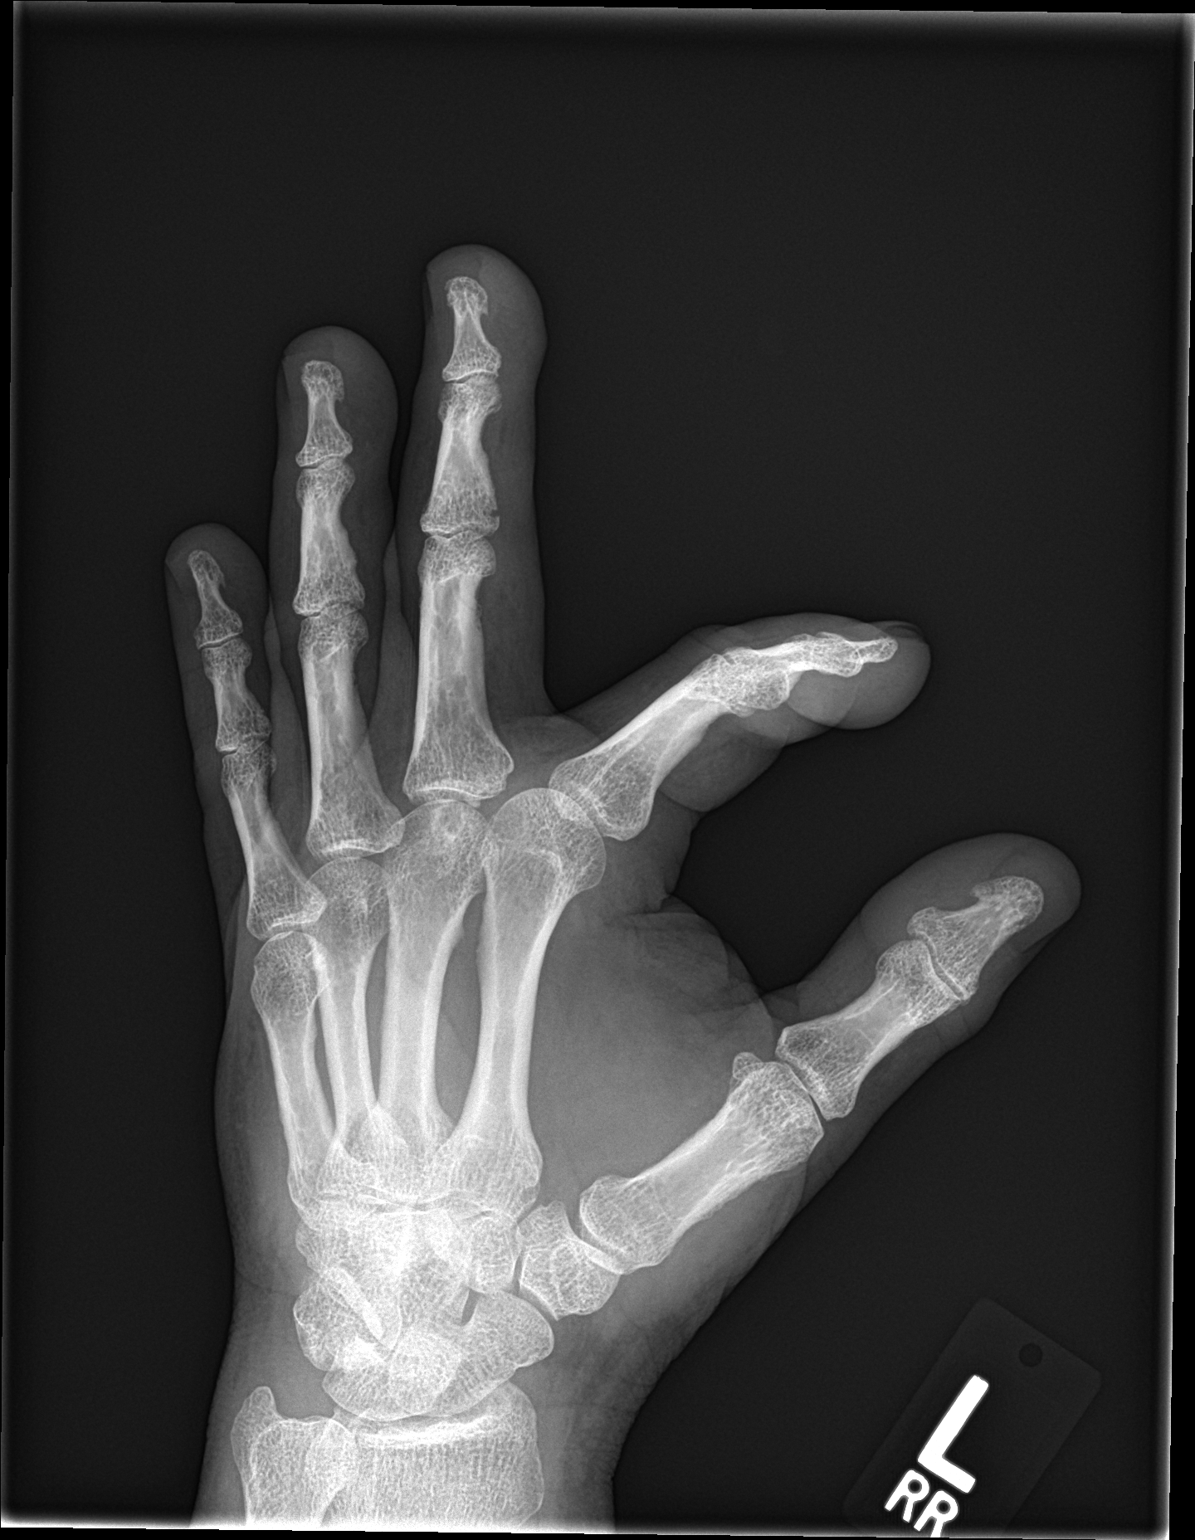

[hand lat]
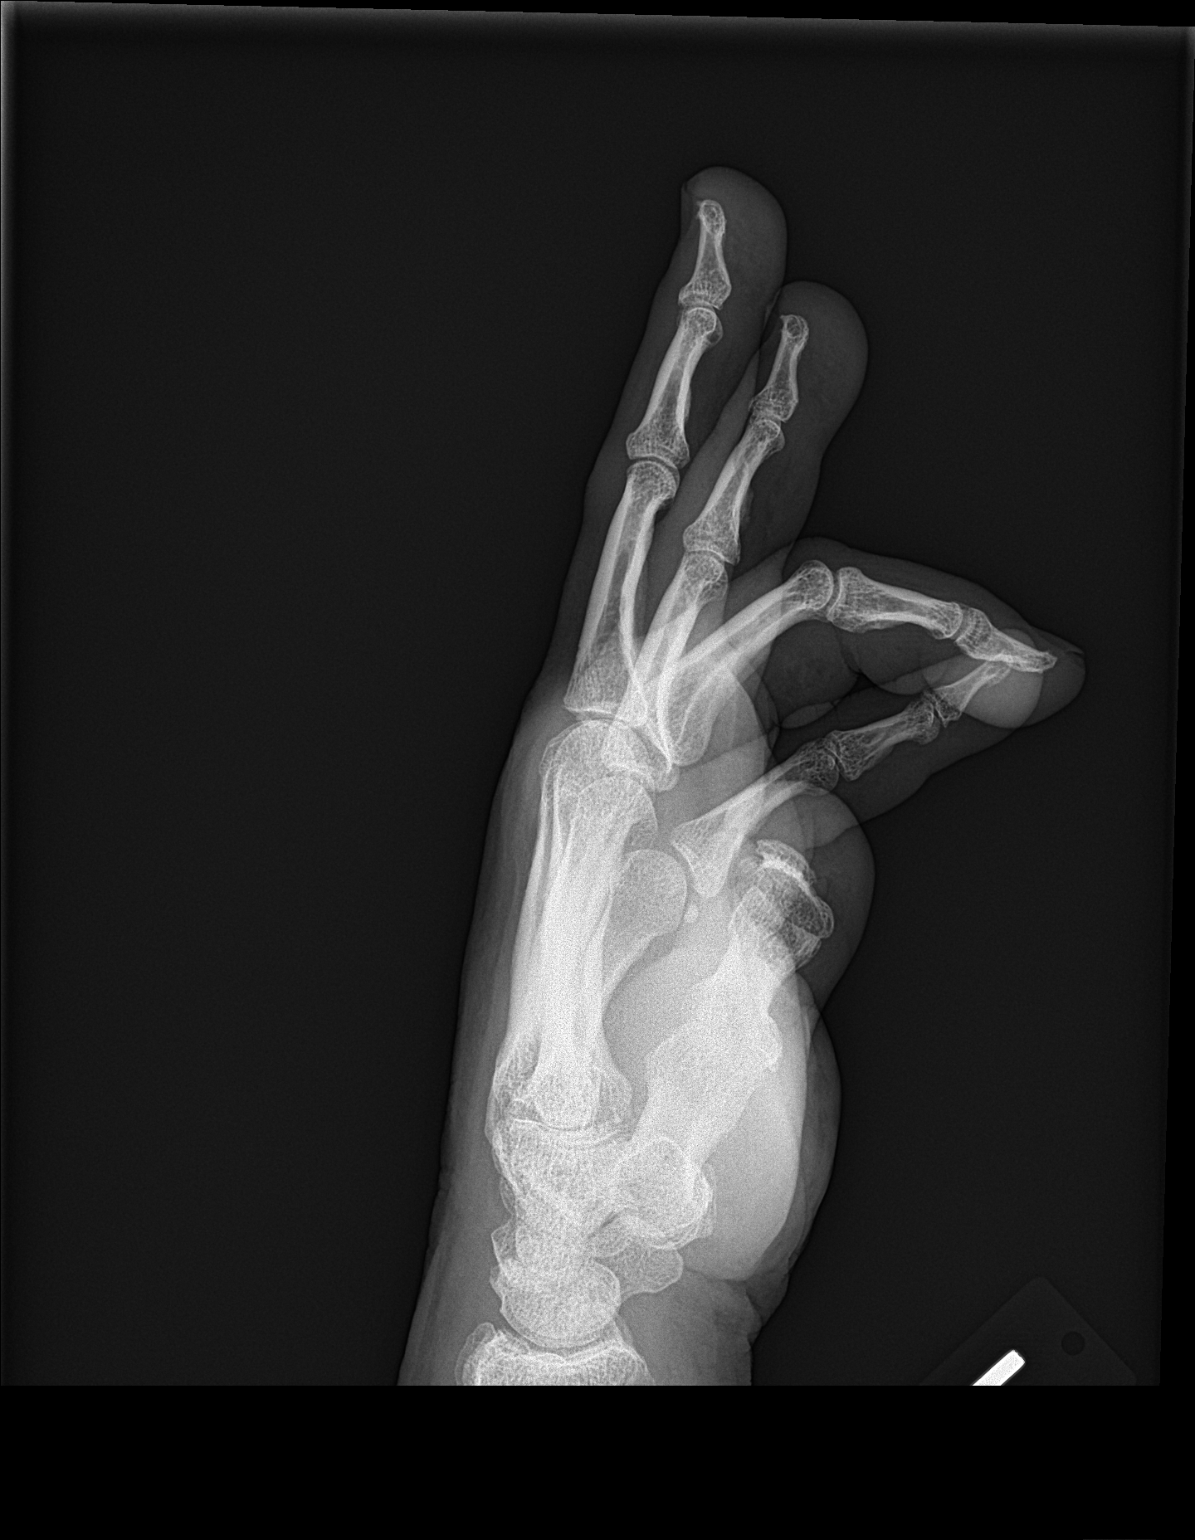

[3 of 3 positions shown; findings below may reference images not displayed]

FINDINGS: Nondisplaced intra-articular fracture at the radial base of the
middle phalanx in the left third finger, with slight bone resorption
at the fracture site likely indicating early healing response. No
additional fracture. No dislocation. No suspicious focal osseous
lesion. Mild osteoarthritis at the left third metacarpophalangeal
joint. No radiopaque foreign body.
IMPRESSION: 1. Nondisplaced intra-articular fracture at the base of the middle
phalanx in the left third finger, with probable early healing
response.
2. No additional fracture.
3. Mild left third MCP joint osteoarthritis.

## 2017-08-18 ENCOUNTER — Encounter (HOSPITAL_COMMUNITY): Payer: Self-pay | Admitting: Emergency Medicine

## 2017-08-18 ENCOUNTER — Other Ambulatory Visit: Payer: Self-pay

## 2017-08-18 ENCOUNTER — Emergency Department (HOSPITAL_COMMUNITY): Payer: 59

## 2017-08-18 ENCOUNTER — Emergency Department (HOSPITAL_COMMUNITY)
Admission: EM | Admit: 2017-08-18 | Discharge: 2017-08-18 | Disposition: A | Discharge status: Home / Self Care | Payer: 59 | Attending: Emergency Medicine | Admitting: Emergency Medicine

## 2017-08-18 DIAGNOSIS — Z87891 Personal history of nicotine dependence: Secondary | ICD-10-CM | POA: Insufficient documentation

## 2017-08-18 DIAGNOSIS — E119 Type 2 diabetes mellitus without complications: Secondary | ICD-10-CM | POA: Diagnosis not present

## 2017-08-18 DIAGNOSIS — R0602 Shortness of breath: Secondary | ICD-10-CM | POA: Diagnosis not present

## 2017-08-18 DIAGNOSIS — R0789 Other chest pain: Secondary | ICD-10-CM

## 2017-08-18 DIAGNOSIS — Z794 Long term (current) use of insulin: Secondary | ICD-10-CM | POA: Diagnosis not present

## 2017-08-18 DIAGNOSIS — E0865 Diabetes mellitus due to underlying condition with hyperglycemia: Secondary | ICD-10-CM

## 2017-08-18 DIAGNOSIS — R079 Chest pain, unspecified: Secondary | ICD-10-CM | POA: Diagnosis present

## 2017-08-18 DIAGNOSIS — Z720 Tobacco use: Secondary | ICD-10-CM | POA: Diagnosis not present

## 2017-08-18 DIAGNOSIS — Z955 Presence of coronary angioplasty implant and graft: Secondary | ICD-10-CM | POA: Insufficient documentation

## 2017-08-18 DIAGNOSIS — Z79899 Other long term (current) drug therapy: Secondary | ICD-10-CM | POA: Insufficient documentation

## 2017-08-18 DIAGNOSIS — I2510 Atherosclerotic heart disease of native coronary artery without angina pectoris: Secondary | ICD-10-CM | POA: Insufficient documentation

## 2017-08-18 DIAGNOSIS — I10 Essential (primary) hypertension: Secondary | ICD-10-CM | POA: Diagnosis not present

## 2017-08-18 DIAGNOSIS — E039 Hypothyroidism, unspecified: Secondary | ICD-10-CM | POA: Insufficient documentation

## 2017-08-18 DIAGNOSIS — R0902 Hypoxemia: Secondary | ICD-10-CM | POA: Diagnosis not present

## 2017-08-18 LAB — BASIC METABOLIC PANEL
ANION GAP: 9 (ref 5–15)
BUN: 10 mg/dL (ref 6–20)
CALCIUM: 9.7 mg/dL (ref 8.9–10.3)
CO2: 27 mmol/L (ref 22–32)
Chloride: 96 mmol/L — ABNORMAL LOW (ref 101–111)
Creatinine, Ser: 1.01 mg/dL (ref 0.61–1.24)
GFR calc Af Amer: >60 mL/min (ref >60)
GFR calc non Af Amer: >60 mL/min (ref >60)
GLUCOSE: 221 mg/dL — AB (ref 65–99)
POTASSIUM: 3.5 mmol/L (ref 3.5–5.1)
Sodium: 132 mmol/L — ABNORMAL LOW (ref 135–145)

## 2017-08-18 LAB — CBC
HCT: 45.8 % (ref 39.0–52.0)
HEMOGLOBIN: 15.7 g/dL (ref 13.0–17.0)
MCH: 31.2 pg (ref 26.0–34.0)
MCHC: 34.3 g/dL (ref 30.0–36.0)
MCV: 90.9 fL (ref 78.0–100.0)
Platelets: 202 10*3/uL (ref 150–400)
RBC: 5.04 MIL/uL (ref 4.22–5.81)
RDW: 12.6 % (ref 11.5–15.5)
WBC: 12.8 10*3/uL — ABNORMAL HIGH (ref 4.0–10.5)

## 2017-08-18 LAB — I-STAT TROPONIN, ED
Troponin i, poc: 0.01 ng/mL (ref 0.00–0.08)
Troponin i, poc: 0.01 ng/mL (ref 0.00–0.08)

## 2017-08-18 MED ORDER — ROSUVASTATIN CALCIUM 20 MG PO TABS: 20.0000 mg | ORAL_TABLET | Freq: Every day | ORAL | 2 refills | Status: DC

## 2017-08-18 MED ORDER — ISOSORBIDE MONONITRATE ER 30 MG PO TB24: 15.0000 mg | ORAL_TABLET | Freq: Every day | ORAL | 1 refills | Status: DC

## 2017-08-18 NOTE — Consult Note (Addendum)
Consult Note    Patient ID: Anthony Woods MRN: [id number], DOB/AGE: September 14, 1955   Admit date: 08/18/2017   Primary Physician: Cassandria Anger, MD Primary Cardiologist: Dr. Angelena Form   Patient Profile    62 year old male with past medical history of PAF, CAD s/p PCI/DES to circumflex (2/18), hypertension, diabetes, obesity and tobacco abuse who presents with chest pain.  Past Medical History   Past Medical History:  Diagnosis Date  . Allergy to bee sting   . Arthritis    "right knee" (05/12/2016)  . CAD (coronary artery disease)   . Headache    "when his sugar goes too high or too low" (05/12/2016)  . HTN (hypertension)   . Hyperlipidemia   . Hypothyroidism   . Macular degeneration   . Obesity   . Type II diabetes mellitus ([place])     Past Surgical History:  Procedure Laterality Date  . CARDIAC CATHETERIZATION    . CORONARY ANGIOPLASTY WITH STENT PLACEMENT  05/12/2016  . CORONARY STENT INTERVENTION N/A 05/12/2016   Procedure: Coronary Stent Intervention;  Surgeon: Jettie Booze, MD;  Location: [facility];  Service: Cardiovascular;  Laterality: N/A;  . GANGLION CYST EXCISION Left 1980s  . KNEE ARTHROSCOPY Right    Meniscus removal  . LEFT HEART CATH AND CORONARY ANGIOGRAPHY N/A 05/12/2016   Procedure: Left Heart Cath and Coronary Angiography;  Surgeon: Jettie Booze, MD;  Location: [facility];  Service: Cardiovascular;  Laterality: N/A;  . LEFT HEART CATHETERIZATION WITH CORONARY ANGIOGRAM N/A 02/04/2012   Procedure: LEFT HEART CATHETERIZATION WITH CORONARY ANGIOGRAM;  Surgeon: Burnell Blanks, MD;  Location: [facility];  Service: Cardiovascular;  Laterality: N/A;     Allergies  Allergies  Allergen Reactions  . Bee Venom Anaphylaxis  . Actos [Pioglitazone Hydrochloride] Swelling  . Lipitor [Atorvastatin] Other (See Comments)    Patient cannot recall reaction  . Lisinopril Other (See Comments)    cough    History of Present  Illness    [name] is a 62 yo male with PMH of PAF, CAD s/p PCI/DES to circumflex (2/18), hypertension, diabetes, obesity and tobacco abuse.  He is followed by Dr. Angelena Form as an outpatient.  He presented to call on in February 2018 with chest pain and found to be in atrial fibrillation.  Underwent cardiac cath at that time which showed severe stenosis in the left circumflex which was treated with a DES x1.  He was discharged on Plavix and Xarelto.  No beta-blocker at that time given his bradycardia.  Echo at that time showed EF of 50 to 55%.  He was last seen in the office on 6/18 for follow-up, and reported being in his usual state of health.  Noted to have continued smoking at that time.  He was continued on Plavix, Xarelto and statin.  Lipids checked by his PCP showed LDL 75. Has been on Zocor 40mg  daily at home. Last Hgb A1c was 9.1 on 12/18.   Reports he has increased his tobacco use to 3ppd recently 2/2 to stress at work. Has not been compliant with his diet and exercise. Also noticed swelling in his lower extremities but seems to resolve once he elevates his feet. Over the past month he has had intermittent episodes of chest discomfort. The episodes do not seem to be associated with rest or activity. Does have some shortness of breath with physical activity and diaphoresis. Episodes are variable. Today while at work he was at a  meeting and developed an acute episode of sharp SSCP after the meeting. Had associated shortness of breath and diaphoresis. EMS was called and given 3 SL nitro with resolution of pain. Episode lasted about 30 mins total.   In the ED his labs showed Na+ 132, Cr 1.0, HGb 15.7, POC trop neg. EKG showed SR with ST depression in inferior leads present on previous EKG from 2/18. No further symptoms since arriving to the ED. CXR negative.    Home Medications    Prior to Admission medications   Medication Sig Start Date End Date Taking? Authorizing Provider    atenolol-chlorthalidone (TENORETIC) 100-25 MG tablet Take 1 tablet by mouth daily. 12/15/16   Plotnikov, Evie Lacks, MD  clopidogrel (PLAVIX) 75 MG tablet Take 1 tablet (75 mg total) by mouth daily with breakfast. 12/15/16   Plotnikov, Evie Lacks, MD  diphenhydrAMINE (BENADRYL) 25 mg capsule Take 25 mg by mouth as needed (bee stings).    [provider]  escitalopram (LEXAPRO) 5 MG tablet TAKE 1 TABLET BY MOUTH DAILY. 07/04/17   Plotnikov, Evie Lacks, MD  HUMALOG MIX 75/25 KWIKPEN (75-25) 100 UNIT/ML Kwikpen Inject into the skin 2 (two) times daily with a meal. Inject 60 units into skin 2 times daily with meals. Injects 40 units at night     [provider]  Insulin Pen Needle (ULTICARE MICRO PEN NEEDLES) 32G X 4 MM MISC use with insulin pen to inject insulin 2x daily. 11/19/16   Renato Shin, MD  levothyroxine (SYNTHROID, LEVOTHROID) 175 MCG tablet TAKE 1 TABLET (175 MCG TOTAL) BY MOUTH DAILY. 02/23/17   Plotnikov, Evie Lacks, MD  linagliptin (TRADJENTA) 5 MG TABS tablet Take 1 tablet (5 mg total) by mouth daily. 08/12/15   Plotnikov, Evie Lacks, MD  losartan (COZAAR) 100 MG tablet TAKE 1 TABLET BY MOUTH DAILY. 02/23/17   Plotnikov, Evie Lacks, MD  metFORMIN (GLUCOPHAGE) 1000 MG tablet Take 1 tablet (1,000 mg total) by mouth 2 (two) times daily with a meal. 08/12/15   Plotnikov, Evie Lacks, MD  nitroGLYCERIN (NITROSTAT) 0.4 MG SL tablet Place 1 tablet (0.4 mg total) under the tongue every 5 (five) minutes as needed for chest pain. 08/23/16   Burnell Blanks, MD  potassium chloride (K-DUR) 10 MEQ tablet TAKE 1 TABLET BY MOUTH 2 TIMES DAILY. 05/03/17   Plotnikov, Evie Lacks, MD  rivaroxaban (XARELTO) 20 MG TABS tablet Take 1 tablet (20 mg total) by mouth daily. 12/15/16   Plotnikov, Evie Lacks, MD  simvastatin (ZOCOR) 40 MG tablet Take 1 tablet (40 mg total) by mouth daily. 12/15/16   Plotnikov, Evie Lacks, MD    Family History    Family History  Problem Relation Age of Onset  . Diabetes  Father   . Cancer Father        stomach  . CVA Father   . Stomach cancer Father   . Diabetes Mother   . Cancer Mother        male ca  . Colon cancer Mother 38  . Hypertension Other   . Diabetes Other   . CAD Maternal Grandmother     Social History    Social History   Socioeconomic History  . Marital status: Married    Spouse name: Not on file  . Number of children: 1  . Years of education: Not on file  . Highest education level: Not on file  Occupational History  . Occupation: [employer]  . Occupation: GRAVE DIGGER  Employer: [employer]  Social Needs  . Financial resource strain: Not on file  . Food insecurity:    Worry: Not on file    Inability: Not on file  . Transportation needs:    Medical: Not on file    Non-medical: Not on file  Tobacco Use  . Smoking status: Former Smoker    Packs/day: 1.50    Years: 42.00    Pack years: 63.00    Types: Cigarettes  . Smokeless tobacco: Never Used  Substance and Sexual Activity  . Alcohol use: No  . Drug use: No  . Sexual activity: Not Currently  Lifestyle  . Physical activity:    Days per week: Not on file    Minutes per session: Not on file  . Stress: Not on file  Relationships  . Social connections:    Talks on phone: Not on file    Gets together: Not on file    Attends religious service: Not on file    Active member of club or organization: Not on file    Attends meetings of clubs or organizations: Not on file    Relationship status: Not on file  . Intimate partner violence:    Fear of current or ex partner: Not on file    Emotionally abused: Not on file    Physically abused: Not on file    Forced sexual activity: Not on file  Other Topics Concern  . Not on file  Social History Narrative  . Not on file     Review of Systems    General:  No chills, fever, night sweats or weight changes.  Cardiovascular:  No chest pain, dyspnea on exertion, edema, orthopnea, palpitations, paroxysmal  nocturnal dyspnea. Dermatological: No rash, lesions/masses Respiratory: No cough, dyspnea Urologic: No hematuria, dysuria Abdominal:   No nausea, vomiting, diarrhea, bright red blood per rectum, melena, or hematemesis Neurologic:  No visual changes, wkns, changes in mental status. All other systems reviewed and are otherwise negative except as noted above.  Physical Exam    Blood pressure 128/60, pulse (!) 56, temperature 98.2 F (36.8 C), temperature source Oral, resp. rate 18, height 5\' 5"  (1.651 m), weight 260 lb (117.9 kg), SpO2 94 %.  General: Pleasant, NAD Psych: Normal affect. Neuro: Alert and oriented X 3. Moves all extremities spontaneously. HEENT: Normal  Neck: Supple without bruits or JVD. Lungs:  Resp regular and unlabored, CTA. Heart: RRR no s3, s4, or murmurs. Abdomen: Soft, non-tender, non-distended, BS + x 4.  Extremities: No clubbing, cyanosis or edema. DP/PT/Radials 2+ and equal bilaterally.  Labs    Troponin (Point of Care Test) Recent Labs    08/18/17 1053  TROPIPOC 0.01   No results for input(s): CKTOTAL, CKMB, TROPONINI in the last 72 hours. Lab Results  Component Value Date   WBC 12.8 (H) 08/18/2017   HGB 15.7 08/18/2017   HCT 45.8 08/18/2017   MCV 90.9 08/18/2017   PLT 202 08/18/2017    Recent Labs  Lab 08/18/17 1054  NA 132*  K 3.5  CL 96*  CO2 27  BUN 10  CREATININE 1.01  CALCIUM 9.7  GLUCOSE 221*   Lab Results  Component Value Date   CHOL 170 04/14/2015   HDL 37.60 (L) 04/14/2015   LDLCALC 100 (H) 04/14/2015   TRIG 163.0 (H) 04/14/2015   No results found for: [facility]   Radiology Studies    Dg Chest 2 View  Result Date: 08/18/2017 CLINICAL DATA:  Chest pain  EXAM: CHEST - 2 VIEW COMPARISON:  05/11/2016 FINDINGS: The heart size and mediastinal contours are within normal limits. Both lungs are clear. The visualized skeletal structures are unremarkable. IMPRESSION: No active cardiopulmonary disease. Electronically Signed   By:  Franchot Gallo M.D.   On: 08/18/2017 11:24    ECG & Cardiac Imaging    EKG: SR with ST depression in the inferior leads, similar to previous EKG in 2/18.   Echo: 05/13/16  Study Conclusions  - Left ventricle: The cavity size was mildly dilated. There was   mild concentric hypertrophy. Systolic function was normal. The   estimated ejection fraction was in the range of 50% to 55%. Wall   motion was normal; there were no regional wall motion   abnormalities. Features are consistent with a pseudonormal left   ventricular filling pattern, with concomitant abnormal relaxation   and increased filling pressure (grade 2 diastolic dysfunction). - Aortic valve: Transvalvular velocity was within the normal range.   There was no stenosis. There was no regurgitation. - Mitral valve: Transvalvular velocity was within the normal range.   There was no evidence for stenosis. There was no regurgitation. - Left atrium: The atrium was moderately dilated. - Right ventricle: The cavity size was normal. Wall thickness was   normal. Systolic function was normal. - Tricuspid valve: There was trivial regurgitation.  Cath: 05/12/16  Conclusion     Mid RCA lesion, 30 %stenosed.  Mid LAD lesion, 20 %stenosed.  Mid Cx lesion, 75 %stenosed. A STENT SYNERGY DES 2.75X16 drug eluting stent was successfully placed, postdilated to 3.1 mm.  Post intervention, there is a 0% residual stenosis.  The left ventricular systolic function is normal.  LV end diastolic pressure is moderately elevated.  The left ventricular ejection fraction is 50-55% by visual estimate.  There is no aortic valve stenosis.   Continue dual antiplatelet therapy for now. He may be started on anticoagulation. If this is the case, will stop aspirin and continue his anticoagulant plus clopidogrel 75 mg daily. This is why we chose clopidogrel in this case. Also, a Synergy drug-eluting stent was used in the event that antiplatelet therapy  needs to be stopped sooner than 1 year.  Overall, he needs aggressive secondary prevention including weight loss, diabetes control and avoidance of tobacco.  Postprocedure fluids minimized due to elevated LVEDP.    Assessment & Plan    62 year old male with past medical history of PAF, CAD s/p PCI/DES to circumflex (2/18), hypertension, diabetes, obesity and tobacco abuse who presents with chest pain.  1. Chest pain: Reports intermittent episodes over the past month. Not really associated with rest or activity. Episode today after a meeting at work. Given 3SL nitro with resolution of symptoms. POC trop neg x1. EKG with no significant change from previous back in 2/18. No further symptoms. Symptoms seem somewhat atypical in nature.  -- check another trop, if neg ok to discharge and keep follow up appt next week.  -- continue plavix without the addition of ASA given his Xarelto use.  -- add Imdur 15mg  daily  2. HL: has been on Zocor prior to admission. Would switch this to crestor 20mg  daily instead given CAD hx and LDL 75.   3. HTN: controlled on current therapy  4. IDDM: Last Hgb A1c was 9 back in December. Needs tight DM control as outpatient.   5. Tobacco abuse: reports he has recently increased his use to 3ppd. Advised the importance of lifestyle modifications moving forward and the  need for compliance with diet/exercise and tobacco use.  6. PAF: Noted in SR on EKG. Continue Xarelto.   Barnet Pall, NP-C Pager [phone] 08/18/2017, 1:41 PM  Personally seen and examined. Agree with above.  62 year old with known coronary disease status post circumflex stent with uncontrolled diabetes, 3 packs/day smoking, paroxysmal atrial fibrillation on Xarelto, hyperlipidemia here with stabbing chest discomfort, sharp substernal lasting several minutes duration after a meeting at work.  His boss asked him to call EMS.  Here in the emergency department, ECG is unchanged with  nonspecific ST-T wave changes, subtle ST depression noted in inferior leads unchanged from prior EKG personally reviewed.  Troponin is thus far negative.  Second pending.  Had some left arm tingling which he asked about.  This went away when he shifts his arm in different positions, likely neuropathic, compression.  GEN: Well nourished, well developed, in no acute distress obese HEENT: normal  Neck: no JVD, carotid bruits, or masses Cardiac: RRR; no murmurs, rubs, or gallops, trace lower extremity edema  Respiratory:  clear to auscultation bilaterally, normal work of breathing GI: soft, nontender, nondistended, + BS MS: no deformity or atrophy  Skin: warm and dry, no rash Neuro:  Alert and Oriented x 3, Strength and sensation are intact Psych: euthymic mood, full affect  Assessment and plan:  Chest pain - Fairly atypical features however he does have underlying coronary artery disease and significant risk factors including morbid obesity, diabetes uncontrolled with hypertension and hyperlipidemia, excessive smoking habits with 3 packs a day smoking.  Thus far sitting in the emergency room, he is chest pain-free.  ECG unchanged from prior.  Troponin thus far normal.  As long as there is no new objective evidence of myocardial injury, i.e. elevated troponin, I would feel comfortable letting him be discharged from the emergency room with close follow-up.  He has an appointment next week with Dr. Angelena Form.  His main objective is to try to quit smoking.  He has been smoking for 40 years and this has escalated recently because of increasing stress.  He understands that 3 packs a day is significantly detrimental to his heart and body.  He is at high risk for worsening heart disease, heart attack. -In addition, I will intensify his statin regimen with Crestor 20 mg once a day, high intensity. -I will add isosorbide 15 mg once a day as well.  Warned him about potential headache side effect. - Strongly  encouraged tobacco cessation.  Diabetes control.  He and his wife were in room.  Understand and comply with plan.  Obviously if symptoms worsen or become more worrisome, he knows to seek medical attention.  Candee Furbish, MD

## 2017-08-18 NOTE — ED Provider Notes (Signed)
[facility] EMERGENCY DEPARTMENT Provider Note   CSN: [id number] Arrival date & time: 08/18/17  1040     History   Chief Complaint Chief Complaint  Patient presents with  . Chest Pain    HPI Anthony Woods is a 62 y.o. male.  The history is provided by the patient and medical records. No language interpreter was used.  Chest Pain   Associated symptoms include diaphoresis, numbness (Left arm) and shortness of breath. Pertinent negatives include no cough and no palpitations.   Anthony Woods is a 62 y.o. male  with a PMH of CAD status post stent placement in February 2018, hypertension, hyperlipidemia, diabetes, A. fib who presents to the Emergency Department complaining of central chest pain associated with left arm tingling, shortness of breath and diaphoresis.  Symptoms have been intermittent over the last month, but coming much more often and more severe when the symptoms occur.  Over the last 2 days, chest pain will occur with minimal exertion and sometimes with rest.  Symptoms do feel similar to previous cardiac events.  No medications at home prior to arrival for his symptoms, however he was given 324 ASA and nitro x3 by EMS.  Patient does report improvement in chest pain following third nitro.  He states pain started around 6/10 and now down to 2/10, however not chest pain-free.   Past Medical History:  Diagnosis Date  . Allergy to bee sting   . Arthritis    "right knee" (05/12/2016)  . CAD (coronary artery disease)   . Headache    "when his sugar goes too high or too low" (05/12/2016)  . HTN (hypertension)   . Hyperlipidemia   . Hypothyroidism   . Macular degeneration   . Obesity   . Type II diabetes mellitus [facility])     Patient Active Problem List   Diagnosis Date Noted  . Stress at work 12/15/2016  . Tobacco dependence 06/04/2016  . Atrial fibrillation with RVR ([place]) 05/12/2016  . Elevated troponin   . Atrial fibrillation ([place]) 05/11/2016  .  Allergic rhinitis 08/12/2015  . Chest pain with moderate risk of acute coronary syndrome 04/14/2015  . Urinary frequency 04/14/2015  . Osteoarthritis of right knee 12/09/2014  . Snoring 09/25/2013  . Rash and nonspecific skin eruption 08/08/2012  . Insomnia 08/08/2012  . Morbid obesity ([place]) 06/10/2011  . Well adult exam 06/10/2011  . Cough due to angiotensin-converting enzyme inhibitor 10/08/2010  . SHOULDER PAIN 03/09/2010  . NECK PAIN 03/09/2010  . TOBACCO USER 06/05/2009  . BRONCHITIS, ACUTE 12/13/2007  . Anaphylactic reaction to bee sting 08/02/2007  . Hypothyroidism 04/04/2007  . Type II diabetes mellitus, uncontrolled ([place]) 04/04/2007  . Essential hypertension 04/04/2007  . Coronary atherosclerosis 04/04/2007  . Edema 04/04/2007    Past Surgical History:  Procedure Laterality Date  . CARDIAC CATHETERIZATION    . CORONARY ANGIOPLASTY WITH STENT PLACEMENT  05/12/2016  . CORONARY STENT INTERVENTION N/A 05/12/2016   Procedure: Coronary Stent Intervention;  Surgeon: Jettie Booze, MD;  Location: [facility] CV LAB;  Service: Cardiovascular;  Laterality: N/A;  . GANGLION CYST EXCISION Left 1980s  . KNEE ARTHROSCOPY Right    Meniscus removal  . LEFT HEART CATH AND CORONARY ANGIOGRAPHY N/A 05/12/2016   Procedure: Left Heart Cath and Coronary Angiography;  Surgeon: Jettie Booze, MD;  Location: [place] CV LAB;  Service: Cardiovascular;  Laterality: N/A;  . LEFT HEART CATHETERIZATION WITH CORONARY ANGIOGRAM N/A 02/04/2012   Procedure: LEFT  HEART CATHETERIZATION WITH CORONARY ANGIOGRAM;  Surgeon: Burnell Blanks, MD;  Location: [facility] CATH LAB;  Service: Cardiovascular;  Laterality: N/A;        Home Medications    Prior to Admission medications   Medication Sig Start Date End Date Taking? Authorizing Provider  atenolol-chlorthalidone (TENORETIC) 100-25 MG tablet Take 1 tablet by mouth daily. 12/15/16  Yes Plotnikov, Evie Lacks, MD  clopidogrel (PLAVIX) 75 MG  tablet Take 1 tablet (75 mg total) by mouth daily with breakfast. 12/15/16  Yes Plotnikov, Evie Lacks, MD  diphenhydrAMINE (BENADRYL) 25 mg capsule Take 25 mg by mouth as needed (bee stings).   Yes [provider]  EPINEPHrine 0.3 mg/0.3 mL IJ SOAJ injection Inject 0.3 mg into the muscle once as needed (for bee stings).   Yes [provider]  escitalopram (LEXAPRO) 5 MG tablet TAKE 1 TABLET BY MOUTH DAILY. Patient taking differently: TAKE 1 TABLET (5mg )  BY MOUTH DAILY. 07/04/17  Yes Plotnikov, Evie Lacks, MD  HUMALOG MIX 75/25 KWIKPEN (75-25) 100 UNIT/ML Kwikpen Inject 10-50 Units into the skin See admin instructions. Inject 50 units in the morning and 10 units at night   Yes [provider]  levothyroxine (SYNTHROID, LEVOTHROID) 175 MCG tablet TAKE 1 TABLET (175 MCG TOTAL) BY MOUTH DAILY. 02/23/17  Yes Plotnikov, Evie Lacks, MD  linagliptin (TRADJENTA) 5 MG TABS tablet Take 1 tablet (5 mg total) by mouth daily. 08/12/15  Yes Plotnikov, Evie Lacks, MD  losartan (COZAAR) 100 MG tablet TAKE 1 TABLET BY MOUTH DAILY. 02/23/17  Yes Plotnikov, Evie Lacks, MD  metFORMIN (GLUCOPHAGE) 1000 MG tablet Take 1 tablet (1,000 mg total) by mouth 2 (two) times daily with a meal. 08/12/15  Yes Plotnikov, Evie Lacks, MD  nitroGLYCERIN (NITROSTAT) 0.4 MG SL tablet Place 1 tablet (0.4 mg total) under the tongue every 5 (five) minutes as needed for chest pain. 08/23/16  Yes Burnell Blanks, MD  potassium chloride (K-DUR) 10 MEQ tablet TAKE 1 TABLET BY MOUTH 2 TIMES DAILY. Patient taking differently: TAKE 1 TABLET (10MEQ)  BY MOUTH 2 TIMES DAILY. 05/03/17  Yes Plotnikov, Evie Lacks, MD  predniSONE (DELTASONE) 10 MG tablet Take 10 mg by mouth daily as needed (if stung by bee).   Yes [provider]  rivaroxaban (XARELTO) 20 MG TABS tablet Take 1 tablet (20 mg total) by mouth daily. 12/15/16  Yes Plotnikov, Evie Lacks, MD  Insulin Pen Needle (ULTICARE MICRO PEN NEEDLES) 32G X 4 MM MISC use with  insulin pen to inject insulin 2x daily. 11/19/16   Renato Shin, MD  isosorbide mononitrate (IMDUR) 30 MG 24 hr tablet Take 0.5 tablets (15 mg total) by mouth daily. 08/18/17 12/16/17  Cheryln Manly, NP  rosuvastatin (CRESTOR) 20 MG tablet Take 1 tablet (20 mg total) by mouth at bedtime. 08/18/17 08/18/18  Cheryln Manly, NP    Family History Family History  Problem Relation Age of Onset  . Diabetes Father   . Cancer Father        stomach  . CVA Father   . Stomach cancer Father   . Diabetes Mother   . Cancer Mother        male ca  . Colon cancer Mother 47  . Hypertension Other   . Diabetes Other   . CAD Maternal Grandmother     Social History Social History   Tobacco Use  . Smoking status: Former Smoker    Packs/day: 1.50    Years: 42.00  Pack years: 63.00    Types: Cigarettes  . Smokeless tobacco: Never Used  Substance Use Topics  . Alcohol use: No  . Drug use: No     Allergies   Bee venom; Actos [pioglitazone hydrochloride]; Lipitor [atorvastatin]; and Lisinopril   Review of Systems Review of Systems  Constitutional: Positive for diaphoresis.  Respiratory: Positive for shortness of breath. Negative for cough and wheezing.   Cardiovascular: Positive for chest pain. Negative for palpitations.  Neurological: Positive for numbness (Left arm).  All other systems reviewed and are negative.    Physical Exam Updated Vital Signs BP 120/67   Pulse (!) 54   Temp 98.2 F (36.8 C) (Oral)   Resp 12   Ht 5\' 5"  (1.651 m)   Wt 117.9 kg (260 lb)   SpO2 94%   BMI 43.27 kg/m   Physical Exam  Constitutional: He is oriented to person, place, and time. He appears well-developed and well-nourished. No distress.  HENT:  Head: Normocephalic and atraumatic.  Cardiovascular: Normal rate, regular rhythm and normal heart sounds.  No murmur heard. Pulmonary/Chest: Effort normal and breath sounds normal. No respiratory distress.  Abdominal: Soft. He exhibits no  distension. There is no tenderness.  Musculoskeletal: He exhibits no edema.  Neurological: He is alert and oriented to person, place, and time.  Skin: Skin is warm and dry.  Nursing note and vitals reviewed.    ED Treatments / Results  Labs (all labs ordered are listed, but only abnormal results are displayed) Labs Reviewed  BASIC METABOLIC PANEL - Abnormal; Notable for the following components:      Result Value   Sodium 132 (*)    Chloride 96 (*)    Glucose, Bld 221 (*)    All other components within normal limits  CBC - Abnormal; Notable for the following components:   WBC 12.8 (*)    All other components within normal limits  I-STAT TROPONIN, ED  I-STAT TROPONIN, ED    EKG EKG Interpretation  Date/Time:  Thursday Aug 18 2017 10:48:19 EDT Ventricular Rate:  64 PR Interval:  140 QRS Duration: 90 QT Interval:  398 QTC Calculation: 410 R Axis:   -38 Text Interpretation:  Normal sinus rhythm Left axis deviation Nonspecific ST and T wave abnormality Abnormal ECG When comapred to prior, no signifiacnt changes seen.  No STEMI Confirmed by Antony Blackbird [phone]) on 08/18/2017 12:35:50 PM   Radiology Dg Chest 2 View  Result Date: 08/18/2017 CLINICAL DATA:  Chest pain EXAM: CHEST - 2 VIEW COMPARISON:  05/11/2016 FINDINGS: The heart size and mediastinal contours are within normal limits. Both lungs are clear. The visualized skeletal structures are unremarkable. IMPRESSION: No active cardiopulmonary disease. Electronically Signed   By: Franchot Gallo M.D.   On: 08/18/2017 11:24    Procedures Procedures (including critical care time)  Medications Ordered in ED Medications - No data to display   Initial Impression / Assessment and Plan / ED Course  I have reviewed the triage vital signs and the nursing notes.  Pertinent labs & imaging results that were available during my care of the patient were reviewed by me and considered in my medical decision making (see chart for  details).    Anthony Woods is a 62 y.o. male who presents to ED for intermittent chest pain associated with shortness of breath and left arm numbness. Patient does have hx of CAD with prior stent placement in 2018. EKG unchanged from previous. Initial troponin negative. Remainder of labs  reviewed: nonspecific leukocytosis of 12.8, hyperglycemia of 221. CXR with no acute findings. Given today's symptoms and patient's cardiac history, as well as improvement with nitro by EMS and heart score of 5, I am concerned symptoms doing are of cardiac etiology. Will consult cards to evaluate.   Patient has been evaluated by cardiology. Please see their note for full details and recommendations. Cardiology recommends repeat troponin, if negative, safe for discharge with close outpatient follow up. He already has an appointment scheduled next week.   Repeat troponin unchanged. Patient feels comfortable with discharge with outpatient follow up with cards. I spoke with patient at length about return precautions with low threshold for returning. Patient and wife understand and agree. All questions answered.   Final Clinical Impressions(s) / ED Diagnoses   Final diagnoses:  Nonspecific chest pain    ED Discharge Orders        Ordered    rosuvastatin (CRESTOR) 20 MG tablet  Daily at bedtime     08/18/17 1419    isosorbide mononitrate (IMDUR) 30 MG 24 hr tablet  Daily     08/18/17 1419       Ward, Ozella Almond, PA-C 08/18/17 1559    Tegeler, Gwenyth Allegra, MD 08/18/17 [phone]

## 2017-08-18 NOTE — ED Triage Notes (Signed)
Pt arrives via EMS from workplace where he developed substernal CP, numbness radiating to left arm, approx 945 pain rating 5/10 with some dizziness. Received 324mg  ASA, 3 SL nitro, pain to 3/10. Pt with hx of stents. EKG NSR. bp 150/64, 95% RA, hr 69. CBG 224. 18g LAC. Pt awake, alert, appropriate, ambulatory.

## 2017-08-18 NOTE — ED Notes (Signed)
ED Provider at bedside. 

## 2017-08-18 NOTE — Discharge Instructions (Signed)
It was my pleasure taking care of you today!   Please keep your appointment with cardiology. They have sent your new prescriptions to the pharmacy.   Return to ER for new or worsening symptoms, any additional concerns.

## 2017-08-23 ENCOUNTER — Telehealth: Payer: Self-pay | Admitting: Cardiovascular Disease

## 2017-08-23 ENCOUNTER — Encounter: Payer: Self-pay | Admitting: *Deleted

## 2017-08-23 NOTE — Telephone Encounter (Signed)
Pt's wife calling   Pt was seen at the ER on 5.30.19 and need a note stating he can returning to work. Please call pt's wife if any questions.

## 2017-08-23 NOTE — Telephone Encounter (Signed)
Reviewed with Dr. Marlou Porch and as long as pt is feeling OK he may return to work now. I spoke with pt's wife. She reports pt is feeling fine. Taking Imdur as ordered.  Will write note and leave at front desk for pt or wife to pick up. I scheduled pt to see Dr. Angelena Form on 08/29/17 at 12:00

## 2017-08-24 ENCOUNTER — Ambulatory Visit: Payer: 59 | Admitting: Cardiovascular Disease

## 2017-08-29 ENCOUNTER — Ambulatory Visit: Payer: 59 | Admitting: Cardiovascular Disease

## 2017-08-29 ENCOUNTER — Encounter: Payer: Self-pay | Admitting: Cardiovascular Disease

## 2017-08-29 VITALS — BP 138/70 | HR 62 | Ht 65.0 in | Wt 257.2 lb

## 2017-08-29 DIAGNOSIS — I10 Essential (primary) hypertension: Secondary | ICD-10-CM | POA: Diagnosis not present

## 2017-08-29 DIAGNOSIS — Z720 Tobacco use: Secondary | ICD-10-CM

## 2017-08-29 DIAGNOSIS — I480 Paroxysmal atrial fibrillation: Secondary | ICD-10-CM | POA: Diagnosis not present

## 2017-08-29 DIAGNOSIS — I25110 Atherosclerotic heart disease of native coronary artery with unstable angina pectoris: Secondary | ICD-10-CM | POA: Diagnosis not present

## 2017-08-29 MED ORDER — ASPIRIN EC 81 MG PO TBEC: 81.0000 mg | DELAYED_RELEASE_TABLET | Freq: Every day | ORAL | 3 refills | Status: DC

## 2017-08-29 NOTE — Progress Notes (Addendum)
Chief Complaint  Patient presents with  . Follow-up    CAD   History of Present Illness: 62 yo male with history of atrial fibrillation, CAD, HTN, DM, obesity and tobacco abuse who is here today for cardiac follow up. He was admitted to [facility] February 2018 with chest pain and was found to be in atrial fibrillation. 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 He was discharged on Plavix and Xarelto. No beta blocker given bradycardia. Echo February 2018 with LVEF=50-55%. He was seen in the ED 08/18/17 with chest pain. He was standing and had onset of severe substernal chest pain, sharp at first and then heavy for an hour. Troponin negative in the ED. He was seen by Dr. Marlou Porch and was discharged home. Recurrence of severe chest pain on 07/27/17. He has noticed more dyspnea. Overall feeling poorly for 2 weeks.   Primary Care Physician: Cassandria Anger, MD  Past Medical History:  Diagnosis Date  . Allergy to bee sting   . Arthritis    "right knee" (05/12/2016)  . CAD (coronary artery disease)   . Headache    "when his sugar goes too high or too low" (05/12/2016)  . HTN (hypertension)   . Hyperlipidemia   . Hypothyroidism   . Macular degeneration   . Obesity   . Type II diabetes mellitus ([place])     Past Surgical History:  Procedure Laterality Date  . CARDIAC CATHETERIZATION    . CORONARY ANGIOPLASTY WITH STENT PLACEMENT  05/12/2016  . CORONARY STENT INTERVENTION N/A 05/12/2016   Procedure: Coronary Stent Intervention;  Surgeon: Jettie Booze, MD;  Location: [place] CV LAB;  Service: Cardiovascular;  Laterality: N/A;  . GANGLION CYST EXCISION Left 1980s  . KNEE ARTHROSCOPY Right    Meniscus removal  . LEFT HEART CATH AND CORONARY ANGIOGRAPHY N/A 05/12/2016   Procedure: Left Heart Cath and Coronary Angiography;  Surgeon: Jettie Booze, MD;  Location: [place] CV LAB;  Service: Cardiovascular;  Laterality:  N/A;  . LEFT HEART CATHETERIZATION WITH CORONARY ANGIOGRAM N/A 02/04/2012   Procedure: LEFT HEART CATHETERIZATION WITH CORONARY ANGIOGRAM;  Surgeon: Burnell Blanks, MD;  Location: [facility] CATH LAB;  Service: Cardiovascular;  Laterality: N/A;    Current Outpatient Medications  Medication Sig Dispense Refill  . atenolol-chlorthalidone (TENORETIC) 100-25 MG tablet Take 1 tablet by mouth daily. 30 tablet 11  . clopidogrel (PLAVIX) 75 MG tablet Take 1 tablet (75 mg total) by mouth daily with breakfast. 30 tablet 11  . diphenhydrAMINE (BENADRYL) 25 mg capsule Take 25 mg by mouth as needed (bee stings).    . EPINEPHrine 0.3 mg/0.3 mL IJ SOAJ injection Inject 0.3 mg into the muscle once as needed (for bee stings).    Marland Kitchen escitalopram (LEXAPRO) 5 MG tablet TAKE 1 TABLET BY MOUTH DAILY. (Patient taking differently: TAKE 1 TABLET (5mg )  BY MOUTH DAILY.) 30 tablet 5  . HUMALOG MIX 75/25 KWIKPEN (75-25) 100 UNIT/ML Kwikpen Inject 10-50 Units into the skin See admin instructions. Inject 50 units in the morning and 10 units at night    . Insulin Pen Needle (ULTICARE MICRO PEN NEEDLES) 32G X 4 MM MISC use with insulin pen to inject insulin 2x daily. 100 each PRN  . isosorbide mononitrate (IMDUR) 30 MG 24 hr tablet Take 0.5 tablets (15 mg total) by mouth daily. 30 tablet 1  . levothyroxine (SYNTHROID, LEVOTHROID) 175 MCG tablet TAKE 1 TABLET (175 MCG TOTAL)  BY MOUTH DAILY. 90 tablet 1  . linagliptin (TRADJENTA) 5 MG TABS tablet Take 1 tablet (5 mg total) by mouth daily. 90 tablet 3  . losartan (COZAAR) 100 MG tablet TAKE 1 TABLET BY MOUTH DAILY. 90 tablet 1  . metFORMIN (GLUCOPHAGE) 1000 MG tablet Take 1 tablet (1,000 mg total) by mouth 2 (two) times daily with a meal. 180 tablet 3  . nitroGLYCERIN (NITROSTAT) 0.4 MG SL tablet Place 1 tablet (0.4 mg total) under the tongue every 5 (five) minutes as needed for chest pain. 25 tablet 6  . potassium chloride (K-DUR) 10 MEQ tablet TAKE 1 TABLET BY MOUTH 2 TIMES  DAILY. (Patient taking differently: TAKE 1 TABLET (10MEQ)  BY MOUTH 2 TIMES DAILY.) 60 tablet 11  . predniSONE (DELTASONE) 10 MG tablet Take 10 mg by mouth daily as needed (if stung by bee).    . rivaroxaban (XARELTO) 20 MG TABS tablet Take 1 tablet (20 mg total) by mouth daily. 30 tablet 11  . rosuvastatin (CRESTOR) 20 MG tablet Take 1 tablet (20 mg total) by mouth at bedtime. 30 tablet 2  . aspirin EC 81 MG tablet Take 1 tablet (81 mg total) by mouth daily. 90 tablet 3   No current facility-administered medications for this visit.     Allergies  Allergen Reactions  . Bee Venom Anaphylaxis  . Actos [Pioglitazone Hydrochloride] Swelling  . Lipitor [Atorvastatin] Other (See Comments)    Patient cannot recall reaction  . Lisinopril Other (See Comments)    cough    Social History   Socioeconomic History  . Marital status: Married    Spouse name: Not on file  . Number of children: 1  . Years of education: Not on file  . Highest education level: Not on file  Occupational History  . Occupation: [employer]  . Occupation: GRAVE DIGGER    Employer: [employer]  . Financial resource strain: Not on file  . Food insecurity:    Worry: Not on file    Inability: Not on file  . Transportation needs:    Medical: Not on file    Non-medical: Not on file  Tobacco Use  . Smoking status: Former Smoker    Packs/day: 1.50    Years: 42.00    Pack years: 63.00    Types: Cigarettes  . Smokeless tobacco: Never Used  Substance and Sexual Activity  . Alcohol use: No  . Drug use: No  . Sexual activity: Not Currently  Lifestyle  . Physical activity:    Days per week: Not on file    Minutes per session: Not on file  . Stress: Not on file  Relationships  . Social connections:    Talks on phone: Not on file    Gets together: Not on file    Attends religious service: Not on file    Active member of club or organization: Not on file    Attends meetings of clubs or  organizations: Not on file    Relationship status: Not on file  . Intimate partner violence:    Fear of current or ex partner: Not on file    Emotionally abused: Not on file    Physically abused: Not on file    Forced sexual activity: Not on file  Other Topics Concern  . Not on file  Social History Narrative  . Not on file    Family History  Problem Relation Age of Onset  . Diabetes Father   .  Cancer Father        stomach  . CVA Father   . Stomach cancer Father   . Diabetes Mother   . Cancer Mother        male ca  . Colon cancer Mother 43  . Hypertension Other   . Diabetes Other   . CAD Maternal Grandmother     Review of Systems:  As stated in the HPI and otherwise negative.   BP 138/70   Pulse 62   Ht 5\' 5"  (1.651 m)   Wt 257 lb 3.2 oz (116.7 kg)   SpO2 93%   BMI 42.80 kg/m   Physical Examination:  General: Well developed, well nourished, NAD  HEENT: OP clear, mucus membranes moist  SKIN: warm, dry. No rashes. Neuro: No focal deficits  Musculoskeletal: Muscle strength 5/5 all ext  Psychiatric: Mood and affect normal  Neck: No JVD, no carotid bruits, no thyromegaly, no lymphadenopathy.  Lungs:Clear bilaterally, no wheezes, rhonci, crackles Cardiovascular: Regular rate and rhythm. No murmurs, gallops or rubs. Abdomen:Soft. Bowel sounds present. Non-tender.  Extremities: No lower extremity edema. Pulses are 2 + in the bilateral DP/PT.  EKG:  EKG is not ordered today. The ekg ordered today demonstrates   Recent Labs: 08/18/2017: BUN 10; Creatinine, Ser 1.01; Hemoglobin 15.7; Platelets 202; Potassium 3.5; Sodium 132   Lipid Panel    Component Value Date/Time   CHOL 170 04/14/2015 0911   TRIG 163.0 (H) 04/14/2015 0911   TRIG 219 (HH) 02/08/2006 0757   HDL 37.60 (L) 04/14/2015 0911   CHOLHDL 5 04/14/2015 0911   VLDL 32.6 04/14/2015 0911   LDLCALC 100 (H) 04/14/2015 0911   LDLDIRECT 100.0 12/09/2014 0827     Wt Readings from Last 3 Encounters:    08/29/17 257 lb 3.2 oz (116.7 kg)  08/18/17 260 lb (117.9 kg)  03/16/17 260 lb (117.9 kg)     Other studies Reviewed: Additional studies/ records that were reviewed today include: . Review of the above records demonstrates:   Assessment and Plan:   1. CAD with unstable angina: He has chest pain and dyspnea c/w unstable angina. He has continued to smoke 3 ppd since his cath/stent in February 2018. I will plan a cardiac cath at [facility] 08/31/17 with Dr. Irish Lack who placed his stent in 2018. (I am not back in the cath lab until June 26,2019). Continue Plavix, beta blocker and statin. I will hold Xarelto today and start ASA 81 mg daily.    I have reviewed the risks, indications, and alternatives to cardiac catheterization, possible angioplasty, and stenting with the patient. Risks include but are not limited to bleeding, infection, vascular injury, stroke, myocardial infection, arrhythmia, kidney injury, radiation-related injury in the case of prolonged fluoroscopy use, emergency cardiac surgery, and death. The patient understands the risks of serious complication is 1-2 in 5053 with diagnostic cardiac cath and 1-2% or less with angioplasty/stenting.  2. Atrial fibrillation, paroxysmal: He appears to be in sinus today. EKG from 08/19/17 reviewed by me with sinus. Will hold Xarelto for cath.     3. HTN: BP is controlled. No changes  4. Tobacco abuse: I have counseled him on tobacco cessation. He has cut back to several cigarettes per day since his ED visit 08/19/17.   Current medicines are reviewed at length with the patient today.  The patient does not have concerns regarding medicines.  The following changes have been made:  no change  Labs/ tests ordered today include:   Orders Placed  This Encounter  Procedures  . Basic metabolic panel  . CBC   Disposition:   FU with office APP on my care team in 3 weeks.   Signed, Lauree Chandler, MD 08/29/2017 2:38 PM    [facility] [address], [place], [place]  [address] Phone: [phone]; Fax: [phone]

## 2017-08-29 NOTE — H&P (View-Only) (Signed)
Chief Complaint  Patient presents with  . Follow-up    CAD   History of Present Illness: 62 yo male with history of atrial fibrillation, CAD, HTN, DM, obesity and tobacco abuse who is here today for cardiac follow up. He was admitted to [facility] February 2018 with chest pain and was found to be in atrial fibrillation. 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 He was discharged on Plavix and Xarelto. No beta blocker given bradycardia. Echo February 2018 with LVEF=50-55%. He was seen in the ED 08/18/17 with chest pain. He was standing and had onset of severe substernal chest pain, sharp at first and then heavy for an hour. Troponin negative in the ED. He was seen by Dr. Marlou Porch and was discharged home. Recurrence of severe chest pain on 07/27/17. He has noticed more dyspnea. Overall feeling poorly for 2 weeks.   Primary Care Physician: Cassandria Anger, MD  Past Medical History:  Diagnosis Date  . Allergy to bee sting   . Arthritis    "right knee" (05/12/2016)  . CAD (coronary artery disease)   . Headache    "when his sugar goes too high or too low" (05/12/2016)  . HTN (hypertension)   . Hyperlipidemia   . Hypothyroidism   . Macular degeneration   . Obesity   . Type II diabetes mellitus ([place])     Past Surgical History:  Procedure Laterality Date  . CARDIAC CATHETERIZATION    . CORONARY ANGIOPLASTY WITH STENT PLACEMENT  05/12/2016  . CORONARY STENT INTERVENTION N/A 05/12/2016   Procedure: Coronary Stent Intervention;  Surgeon: Jettie Booze, MD;  Location: [place] [facility];  Service: Cardiovascular;  Laterality: N/A;  . GANGLION CYST EXCISION Left 1980s  . KNEE ARTHROSCOPY Right    Meniscus removal  . LEFT HEART CATH AND CORONARY ANGIOGRAPHY N/A 05/12/2016   Procedure: Left Heart Cath and Coronary Angiography;  Surgeon: Jettie Booze, MD;  Location: [place] [facility];  Service: Cardiovascular;  Laterality:  N/A;  . LEFT HEART CATHETERIZATION WITH CORONARY ANGIOGRAM N/A 02/04/2012   Procedure: LEFT HEART CATHETERIZATION WITH CORONARY ANGIOGRAM;  Surgeon: Burnell Blanks, MD;  Location: [facility];  Service: Cardiovascular;  Laterality: N/A;    Current Outpatient Medications  Medication Sig Dispense Refill  . atenolol-chlorthalidone (TENORETIC) 100-25 MG tablet Take 1 tablet by mouth daily. 30 tablet 11  . clopidogrel (PLAVIX) 75 MG tablet Take 1 tablet (75 mg total) by mouth daily with breakfast. 30 tablet 11  . diphenhydrAMINE (BENADRYL) 25 mg capsule Take 25 mg by mouth as needed (bee stings).    . EPINEPHrine 0.3 mg/0.3 mL IJ SOAJ injection Inject 0.3 mg into the muscle once as needed (for bee stings).    Marland Kitchen escitalopram (LEXAPRO) 5 MG tablet TAKE 1 TABLET BY MOUTH DAILY. (Patient taking differently: TAKE 1 TABLET (5mg )  BY MOUTH DAILY.) 30 tablet 5  . HUMALOG MIX 75/25 KWIKPEN (75-25) 100 UNIT/ML Kwikpen Inject 10-50 Units into the skin See admin instructions. Inject 50 units in the morning and 10 units at night    . Insulin Pen Needle (ULTICARE MICRO PEN NEEDLES) 32G X 4 MM MISC use with insulin pen to inject insulin 2x daily. 100 each PRN  . isosorbide mononitrate (IMDUR) 30 MG 24 hr tablet Take 0.5 tablets (15 mg total) by mouth daily. 30 tablet 1  . levothyroxine (SYNTHROID, LEVOTHROID) 175 MCG tablet TAKE 1 TABLET (175 MCG TOTAL)  BY MOUTH DAILY. 90 tablet 1  . linagliptin (TRADJENTA) 5 MG TABS tablet Take 1 tablet (5 mg total) by mouth daily. 90 tablet 3  . losartan (COZAAR) 100 MG tablet TAKE 1 TABLET BY MOUTH DAILY. 90 tablet 1  . metFORMIN (GLUCOPHAGE) 1000 MG tablet Take 1 tablet (1,000 mg total) by mouth 2 (two) times daily with a meal. 180 tablet 3  . nitroGLYCERIN (NITROSTAT) 0.4 MG SL tablet Place 1 tablet (0.4 mg total) under the tongue every 5 (five) minutes as needed for chest pain. 25 tablet 6  . potassium chloride (K-DUR) 10 MEQ tablet TAKE 1 TABLET BY MOUTH 2 TIMES  DAILY. (Patient taking differently: TAKE 1 TABLET (10MEQ)  BY MOUTH 2 TIMES DAILY.) 60 tablet 11  . predniSONE (DELTASONE) 10 MG tablet Take 10 mg by mouth daily as needed (if stung by bee).    . rivaroxaban (XARELTO) 20 MG TABS tablet Take 1 tablet (20 mg total) by mouth daily. 30 tablet 11  . rosuvastatin (CRESTOR) 20 MG tablet Take 1 tablet (20 mg total) by mouth at bedtime. 30 tablet 2  . aspirin EC 81 MG tablet Take 1 tablet (81 mg total) by mouth daily. 90 tablet 3   No current facility-administered medications for this visit.     Allergies  Allergen Reactions  . Bee Venom Anaphylaxis  . Actos [Pioglitazone Hydrochloride] Swelling  . Lipitor [Atorvastatin] Other (See Comments)    Patient cannot recall reaction  . Lisinopril Other (See Comments)    cough    Social History   Socioeconomic History  . Marital status: Married    Spouse name: Not on file  . Number of children: 1  . Years of education: Not on file  . Highest education level: Not on file  Occupational History  . Occupation: [employer]  . Occupation: GRAVE DIGGER    Employer: [employer]  . Financial resource strain: Not on file  . Food insecurity:    Worry: Not on file    Inability: Not on file  . Transportation needs:    Medical: Not on file    Non-medical: Not on file  Tobacco Use  . Smoking status: Former Smoker    Packs/day: 1.50    Years: 42.00    Pack years: 63.00    Types: Cigarettes  . Smokeless tobacco: Never Used  Substance and Sexual Activity  . Alcohol use: No  . Drug use: No  . Sexual activity: Not Currently  Lifestyle  . Physical activity:    Days per week: Not on file    Minutes per session: Not on file  . Stress: Not on file  Relationships  . Social connections:    Talks on phone: Not on file    Gets together: Not on file    Attends religious service: Not on file    Active member of club or organization: Not on file    Attends meetings of clubs or  organizations: Not on file    Relationship status: Not on file  . Intimate partner violence:    Fear of current or ex partner: Not on file    Emotionally abused: Not on file    Physically abused: Not on file    Forced sexual activity: Not on file  Other Topics Concern  . Not on file  Social History Narrative  . Not on file    Family History  Problem Relation Age of Onset  . Diabetes Father   .  Cancer Father        stomach  . CVA Father   . Stomach cancer Father   . Diabetes Mother   . Cancer Mother        male ca  . Colon cancer Mother 50  . Hypertension Other   . Diabetes Other   . CAD Maternal Grandmother     Review of Systems:  As stated in the HPI and otherwise negative.   BP 138/70   Pulse 62   Ht 5\' 5"  (1.651 m)   Wt 257 lb 3.2 oz (116.7 kg)   SpO2 93%   BMI 42.80 kg/m   Physical Examination:  General: Well developed, well nourished, NAD  HEENT: OP clear, mucus membranes moist  SKIN: warm, dry. No rashes. Neuro: No focal deficits  Musculoskeletal: Muscle strength 5/5 all ext  Psychiatric: Mood and affect normal  Neck: No JVD, no carotid bruits, no thyromegaly, no lymphadenopathy.  Lungs:Clear bilaterally, no wheezes, rhonci, crackles Cardiovascular: Regular rate and rhythm. No murmurs, gallops or rubs. Abdomen:Soft. Bowel sounds present. Non-tender.  Extremities: No lower extremity edema. Pulses are 2 + in the bilateral DP/PT.  EKG:  EKG is not ordered today. The ekg ordered today demonstrates   Recent Labs: 08/18/2017: BUN 10; Creatinine, Ser 1.01; Hemoglobin 15.7; Platelets 202; Potassium 3.5; Sodium 132   Lipid Panel    Component Value Date/Time   CHOL 170 04/14/2015 0911   TRIG 163.0 (H) 04/14/2015 0911   TRIG 219 (HH) 02/08/2006 0757   HDL 37.60 (L) 04/14/2015 0911   CHOLHDL 5 04/14/2015 0911   VLDL 32.6 04/14/2015 0911   LDLCALC 100 (H) 04/14/2015 0911   LDLDIRECT 100.0 12/09/2014 0827     Wt Readings from Last 3 Encounters:    08/29/17 257 lb 3.2 oz (116.7 kg)  08/18/17 260 lb (117.9 kg)  03/16/17 260 lb (117.9 kg)     Other studies Reviewed: Additional studies/ records that were reviewed today include: . Review of the above records demonstrates:   Assessment and Plan:   1. CAD with unstable angina: He has chest pain and dyspnea c/w unstable angina. He has continued to smoke 3 ppd since his cath/stent in February 2018. I will plan a cardiac cath at [facility] 08/31/17 with Dr. Irish Lack who placed his stent in 2018. (I am not back in the cath lab until June 26,2019). Continue Plavix, beta blocker and statin. I will hold Xarelto today and start ASA 81 mg daily.    I have reviewed the risks, indications, and alternatives to cardiac catheterization, possible angioplasty, and stenting with the patient. Risks include but are not limited to bleeding, infection, vascular injury, stroke, myocardial infection, arrhythmia, kidney injury, radiation-related injury in the case of prolonged fluoroscopy use, emergency cardiac surgery, and death. The patient understands the risks of serious complication is 1-2 in 8546 with diagnostic cardiac cath and 1-2% or less with angioplasty/stenting.  2. Atrial fibrillation, paroxysmal: He appears to be in sinus today. EKG from 08/19/17 reviewed by me with sinus. Will hold Xarelto for cath.     3. HTN: BP is controlled. No changes  4. Tobacco abuse: I have counseled him on tobacco cessation. He has cut back to several cigarettes per day since his ED visit 08/19/17.   Current medicines are reviewed at length with the patient today.  The patient does not have concerns regarding medicines.  The following changes have been made:  no change  Labs/ tests ordered today include:   Orders Placed  This Encounter  Procedures  . Basic metabolic panel  . CBC   Disposition:   FU with office APP on my care team in 3 weeks.   Signed, Lauree Chandler, MD 08/29/2017 2:38 PM    [facility] [place], [place], [place]  [address] Phone: [phone]; Fax: [phone]

## 2017-08-29 NOTE — Patient Instructions (Addendum)
Your physician has recommended you make the following change in your medication:  1.) start aspirin 81 mg once daily Your physician recommends that you return for lab work today (bmet,  cbc) Your physician recommends that you schedule a follow-up appointment in: 3-4 weeks with APP for Dr. Angelena Form.    [facility] [address] [place] [address] Dept: [phone] Loc: [phone]  Anthony Woods  08/29/2017  You are scheduled for a Cardiac Catheterization on Wednesday, June 12 with Dr. Larae Grooms.  1. Please arrive at the [facility] (Main Entrance A) at [facility]: [address] [place], [place] [address] at 12:30 PM (two hours before your procedure to ensure your preparation). Free valet parking service is available.   Special note: Every effort is made to have your procedure done on time. Please understand that emergencies sometimes delay scheduled procedures.  2. Diet: Do not eat or drink anything after midnight prior to your procedure except sips of water to take medications.  3. Labs: You will need to have blood drawn on Monday, June 10 at [facility] at [facility]. [address]  Open: 7:30am - 5pm    Phone: [phone]. You do not need to be fasting.  4. Medication instructions in preparation for your procedure:   Stop taking Xarelto (Rivaroxaban) on Monday, June 10.  Stop taking, Glucophage (Metformin) on Tuesday, June 11.    Take only 5 units of insulin the night before your procedure. Do not take any insulin on the day of the procedure.  On the morning of your procedure, take your Aspirin and Plavix and any morning medicines NOT listed above.  You may use sips of water.  5. Plan for one night stay--bring personal belongings. 6. Bring a current list of your medications and current insurance cards. 7. You MUST have a  responsible person to drive you home. 8. Someone MUST be with you the first 24 hours after you arrive home or your discharge will be delayed. 9. Please wear clothes that are easy to get on and off and wear slip-on shoes.  Thank you for allowing [place] to care for you!   -- [facility]

## 2017-08-30 ENCOUNTER — Telehealth: Payer: Self-pay | Admitting: *Deleted

## 2017-08-30 LAB — CBC
HEMATOCRIT: 45.5 % (ref 37.5–51.0)
Hemoglobin: 15.4 g/dL (ref 13.0–17.7)
MCH: 31.6 pg (ref 26.6–33.0)
MCHC: 33.8 g/dL (ref 31.5–35.7)
MCV: 93 fL (ref 79–97)
Platelets: 222 10*3/uL (ref 150–450)
RBC: 4.87 x10E6/uL (ref 4.14–5.80)
RDW: 13.1 % (ref 12.3–15.4)
WBC: 11.1 10*3/uL — ABNORMAL HIGH (ref 3.4–10.8)

## 2017-08-30 LAB — BASIC METABOLIC PANEL
BUN / CREAT RATIO: 11 (ref 10–24)
BUN: 11 mg/dL (ref 8–27)
CHLORIDE: 97 mmol/L (ref 96–106)
CO2: 29 mmol/L (ref 20–29)
Calcium: 10.5 mg/dL — ABNORMAL HIGH (ref 8.6–10.2)
Creatinine, Ser: 1.04 mg/dL (ref 0.76–1.27)
GFR calc non Af Amer: 77 mL/min/{1.73_m2} (ref >59)
GFR, EST AFRICAN AMERICAN: 89 mL/min/{1.73_m2} (ref >59)
Glucose: 220 mg/dL — ABNORMAL HIGH (ref 65–99)
Potassium: 4.2 mmol/L (ref 3.5–5.2)
SODIUM: 141 mmol/L (ref 134–144)

## 2017-08-30 NOTE — Telephone Encounter (Signed)
Catheterization scheduled at [facility] for: Wednesday August 31, 2017 2:30 PM Arrival time and place: [facility] Main Entrance A at: 12:30 PM  No solid food after midnight prior to cath, clear liquids until 5 AM day of procedure. Verify allergies in Epic  Hold: Rivaroxaban 08/29/17 until post procedure. Metformin 08/31/17 and 48 hours post procedure. Tradjenta AM of procedure. Insulin AM of procedure. 1/2 usual dose long acting Insulin PM prior to procedure. Atenolol-chlorthalidone AM of procedure.  KCl AM of procedure.  Except hold medications AM meds can be  taken pre-cath with sip of water including: ASA 81 mg Clopidogrel 75 mg  Confirm patient has responsible person to drive home post procedure and observe patient for 24 hours:yes

## 2017-08-31 ENCOUNTER — Other Ambulatory Visit: Payer: Self-pay

## 2017-08-31 ENCOUNTER — Ambulatory Visit (HOSPITAL_COMMUNITY)
Admission: RE | Disposition: A | Discharge status: Home / Self Care | Payer: Self-pay | Source: Ambulatory Visit | Attending: Interventional Cardiology

## 2017-08-31 ENCOUNTER — Telehealth: Payer: Self-pay | Admitting: Cardiovascular Disease

## 2017-08-31 ENCOUNTER — Ambulatory Visit (HOSPITAL_COMMUNITY)
Admission: RE | Admit: 2017-08-31 | Discharge: 2017-08-31 | Disposition: A | Discharge status: Home / Self Care | Payer: 59 | Source: Ambulatory Visit | Attending: Interventional Cardiology | Admitting: Interventional Cardiology

## 2017-08-31 DIAGNOSIS — I480 Paroxysmal atrial fibrillation: Secondary | ICD-10-CM | POA: Insufficient documentation

## 2017-08-31 DIAGNOSIS — Z794 Long term (current) use of insulin: Secondary | ICD-10-CM | POA: Insufficient documentation

## 2017-08-31 DIAGNOSIS — E119 Type 2 diabetes mellitus without complications: Secondary | ICD-10-CM | POA: Diagnosis not present

## 2017-08-31 DIAGNOSIS — I25110 Atherosclerotic heart disease of native coronary artery with unstable angina pectoris: Secondary | ICD-10-CM | POA: Diagnosis not present

## 2017-08-31 DIAGNOSIS — E785 Hyperlipidemia, unspecified: Secondary | ICD-10-CM | POA: Insufficient documentation

## 2017-08-31 DIAGNOSIS — I10 Essential (primary) hypertension: Secondary | ICD-10-CM | POA: Diagnosis not present

## 2017-08-31 DIAGNOSIS — E039 Hypothyroidism, unspecified: Secondary | ICD-10-CM | POA: Insufficient documentation

## 2017-08-31 DIAGNOSIS — Z8249 Family history of ischemic heart disease and other diseases of the circulatory system: Secondary | ICD-10-CM | POA: Diagnosis not present

## 2017-08-31 DIAGNOSIS — Z7982 Long term (current) use of aspirin: Secondary | ICD-10-CM | POA: Insufficient documentation

## 2017-08-31 DIAGNOSIS — Z6841 Body Mass Index (BMI) 40.0 and over, adult: Secondary | ICD-10-CM | POA: Insufficient documentation

## 2017-08-31 DIAGNOSIS — Z955 Presence of coronary angioplasty implant and graft: Secondary | ICD-10-CM | POA: Insufficient documentation

## 2017-08-31 DIAGNOSIS — Z87891 Personal history of nicotine dependence: Secondary | ICD-10-CM | POA: Insufficient documentation

## 2017-08-31 DIAGNOSIS — H3530 Unspecified macular degeneration: Secondary | ICD-10-CM | POA: Insufficient documentation

## 2017-08-31 DIAGNOSIS — E669 Obesity, unspecified: Secondary | ICD-10-CM | POA: Diagnosis not present

## 2017-08-31 DIAGNOSIS — Z79899 Other long term (current) drug therapy: Secondary | ICD-10-CM | POA: Diagnosis not present

## 2017-08-31 DIAGNOSIS — Z7901 Long term (current) use of anticoagulants: Secondary | ICD-10-CM | POA: Insufficient documentation

## 2017-08-31 DIAGNOSIS — Z7902 Long term (current) use of antithrombotics/antiplatelets: Secondary | ICD-10-CM | POA: Insufficient documentation

## 2017-08-31 DIAGNOSIS — Z91030 Bee allergy status: Secondary | ICD-10-CM | POA: Diagnosis not present

## 2017-08-31 DIAGNOSIS — Z9889 Other specified postprocedural states: Secondary | ICD-10-CM | POA: Insufficient documentation

## 2017-08-31 DIAGNOSIS — Z833 Family history of diabetes mellitus: Secondary | ICD-10-CM | POA: Insufficient documentation

## 2017-08-31 DIAGNOSIS — Z79890 Hormone replacement therapy (postmenopausal): Secondary | ICD-10-CM | POA: Diagnosis not present

## 2017-08-31 DIAGNOSIS — Z888 Allergy status to other drugs, medicaments and biological substances status: Secondary | ICD-10-CM | POA: Insufficient documentation

## 2017-08-31 DIAGNOSIS — Z823 Family history of stroke: Secondary | ICD-10-CM | POA: Diagnosis not present

## 2017-08-31 DIAGNOSIS — I209 Angina pectoris, unspecified: Secondary | ICD-10-CM

## 2017-08-31 HISTORY — PX: LEFT HEART CATH AND CORONARY ANGIOGRAPHY: CATH118249

## 2017-08-31 HISTORY — PX: CORONARY STENT INTERVENTION: CATH118234

## 2017-08-31 LAB — POCT ACTIVATED CLOTTING TIME
ACTIVATED CLOTTING TIME: 296 s
Activated Clotting Time: 329 seconds

## 2017-08-31 LAB — GLUCOSE, CAPILLARY: Glucose-Capillary: 264 mg/dL — ABNORMAL HIGH (ref 65–99)

## 2017-08-31 SURGERY — LEFT HEART CATH AND CORONARY ANGIOGRAPHY
Anesthesia: LOCAL

## 2017-08-31 MED ORDER — MIDAZOLAM HCL 2 MG/2ML IJ SOLN
INTRAMUSCULAR | Status: AC
  Filled 2017-08-31: qty 2

## 2017-08-31 MED ORDER — FENTANYL CITRATE (PF) 100 MCG/2ML IJ SOLN
INTRAMUSCULAR | Status: DC | PRN
  Administered 2017-08-31 (×2): 50 ug via INTRAVENOUS

## 2017-08-31 MED ORDER — HYDRALAZINE HCL 20 MG/ML IJ SOLN
5.0000 mg | INTRAMUSCULAR | Status: AC | PRN
  Administered 2017-08-31: 5 mg via INTRAVENOUS

## 2017-08-31 MED ORDER — HEPARIN (PORCINE) IN NACL 2-0.9 UNITS/ML
INTRAMUSCULAR | Status: AC | PRN
  Administered 2017-08-31: 1000 mL

## 2017-08-31 MED ORDER — CLOPIDOGREL BISULFATE 300 MG PO TABS
ORAL_TABLET | ORAL | Status: DC | PRN
  Administered 2017-08-31: 300 mg via ORAL

## 2017-08-31 MED ORDER — ASPIRIN 81 MG PO CHEW: 81.0000 mg | CHEWABLE_TABLET | Freq: Every day | ORAL | Status: DC

## 2017-08-31 MED ORDER — ACETAMINOPHEN 325 MG PO TABS: 650.0000 mg | ORAL_TABLET | ORAL | Status: DC | PRN

## 2017-08-31 MED ORDER — CLOPIDOGREL BISULFATE 300 MG PO TABS
ORAL_TABLET | ORAL | Status: AC
  Filled 2017-08-31: qty 1

## 2017-08-31 MED ORDER — SODIUM CHLORIDE 0.9% FLUSH: 3.0000 mL | Freq: Two times a day (BID) | INTRAVENOUS | Status: DC

## 2017-08-31 MED ORDER — LABETALOL HCL 5 MG/ML IV SOLN: 10.0000 mg | INTRAVENOUS | Status: AC | PRN

## 2017-08-31 MED ORDER — LIDOCAINE HCL (PF) 1 % IJ SOLN
INTRAMUSCULAR | Status: AC
  Filled 2017-08-31: qty 30

## 2017-08-31 MED ORDER — FENTANYL CITRATE (PF) 100 MCG/2ML IJ SOLN
INTRAMUSCULAR | Status: AC
  Filled 2017-08-31: qty 2

## 2017-08-31 MED ORDER — SODIUM CHLORIDE 0.9% FLUSH: 3.0000 mL | INTRAVENOUS | Status: DC | PRN

## 2017-08-31 MED ORDER — IOHEXOL 350 MG/ML SOLN
INTRAVENOUS | Status: DC | PRN
  Administered 2017-08-31: 145 mL

## 2017-08-31 MED ORDER — ONDANSETRON HCL 4 MG/2ML IJ SOLN: 4.0000 mg | Freq: Four times a day (QID) | INTRAMUSCULAR | Status: DC | PRN

## 2017-08-31 MED ORDER — HEPARIN SODIUM (PORCINE) 1000 UNIT/ML IJ SOLN
INTRAMUSCULAR | Status: AC
  Filled 2017-08-31: qty 1

## 2017-08-31 MED ORDER — SODIUM CHLORIDE 0.9 % IV SOLN: 250.0000 mL | INTRAVENOUS | Status: DC | PRN

## 2017-08-31 MED ORDER — FAMOTIDINE IN NACL 20-0.9 MG/50ML-% IV SOLN
INTRAVENOUS | Status: AC | PRN
  Administered 2017-08-31: 20 mg via INTRAVENOUS

## 2017-08-31 MED ORDER — VERAPAMIL HCL 2.5 MG/ML IV SOLN
INTRAVENOUS | Status: AC
  Filled 2017-08-31: qty 2

## 2017-08-31 MED ORDER — SODIUM CHLORIDE 0.9 % IV SOLN: INTRAVENOUS | Status: AC

## 2017-08-31 MED ORDER — FAMOTIDINE IN NACL 20-0.9 MG/50ML-% IV SOLN
INTRAVENOUS | Status: AC
  Filled 2017-08-31: qty 50

## 2017-08-31 MED ORDER — HYDRALAZINE HCL 20 MG/ML IJ SOLN
INTRAMUSCULAR | Status: AC
  Filled 2017-08-31: qty 1

## 2017-08-31 MED ORDER — ASPIRIN 81 MG PO CHEW: 81.0000 mg | CHEWABLE_TABLET | ORAL | Status: DC

## 2017-08-31 MED ORDER — HEPARIN SODIUM (PORCINE) 1000 UNIT/ML IJ SOLN
INTRAMUSCULAR | Status: DC | PRN
  Administered 2017-08-31 (×2): 6000 [IU] via INTRAVENOUS
  Administered 2017-08-31: 2000 [IU] via INTRAVENOUS

## 2017-08-31 MED ORDER — CLOPIDOGREL BISULFATE 75 MG PO TABS: 75.0000 mg | ORAL_TABLET | Freq: Every day | ORAL | Status: DC

## 2017-08-31 MED ORDER — HEPARIN (PORCINE) IN NACL 1000-0.9 UT/500ML-% IV SOLN
INTRAVENOUS | Status: AC
  Filled 2017-08-31: qty 1000

## 2017-08-31 MED ORDER — VERAPAMIL HCL 2.5 MG/ML IV SOLN
INTRAVENOUS | Status: DC | PRN
  Administered 2017-08-31: 10 mL via INTRA_ARTERIAL

## 2017-08-31 MED ORDER — LIDOCAINE HCL (PF) 1 % IJ SOLN
INTRAMUSCULAR | Status: DC | PRN
  Administered 2017-08-31: 2 mL

## 2017-08-31 MED ORDER — NITROGLYCERIN 1 MG/10 ML FOR IR/CATH LAB
INTRA_ARTERIAL | Status: AC
  Filled 2017-08-31: qty 10

## 2017-08-31 MED ORDER — MIDAZOLAM HCL 2 MG/2ML IJ SOLN
INTRAMUSCULAR | Status: DC | PRN
  Administered 2017-08-31: 2 mg via INTRAVENOUS

## 2017-08-31 MED ORDER — SODIUM CHLORIDE 0.9 % IV SOLN
INTRAVENOUS | Status: AC
  Administered 2017-08-31: 13:00:00 via INTRAVENOUS

## 2017-08-31 SURGICAL SUPPLY — 21 items
BALLN SAPPHIRE 2.5X12 (BALLOONS) ×2
BALLN SAPPHIRE ~~LOC~~ 3.5X12 (BALLOONS) ×1 IMPLANT
BALLOON SAPPHIRE 2.5X12 (BALLOONS) IMPLANT
CATH IMPULSE 5F ANG/FL3.5 (CATHETERS) ×2 IMPLANT
CATH LAUNCHER 6FR EBU3.5 (CATHETERS) ×2 IMPLANT
COVER PRB 48X5XTLSCP FOLD TPE (BAG) ×1 IMPLANT
COVER PROBE 5X48 (BAG) ×2
DEVICE RAD COMP TR BAND LRG (VASCULAR PRODUCTS) ×2 IMPLANT
GLIDESHEATH SLEND SS 6F .021 (SHEATH) ×2 IMPLANT
GUIDEWIRE INQWIRE 1.5J.035X260 (WIRE) ×1 IMPLANT
INQWIRE 1.5J .035X260CM (WIRE) ×2
KIT ENCORE 26 ADVANTAGE (KITS) ×2 IMPLANT
KIT HEART LEFT (KITS) ×2 IMPLANT
KIT HEMO VALVE WATCHDOG (MISCELLANEOUS) ×2 IMPLANT
NDL PERC 21GX4CM (NEEDLE) ×1 IMPLANT
NEEDLE PERC 21GX4CM (NEEDLE) ×2 IMPLANT
PACK CARDIAC CATHETERIZATION (CUSTOM PROCEDURE TRAY) ×2 IMPLANT
STENT SYNERGY DES 3X24 (Permanent Stent) ×1 IMPLANT
TRANSDUCER W/STOPCOCK (MISCELLANEOUS) ×2 IMPLANT
TUBING CIL FLEX 10 FLL-RA (TUBING) ×2 IMPLANT
WIRE ASAHI PROWATER 180CM (WIRE) ×2 IMPLANT

## 2017-08-31 NOTE — Discharge Summary (Addendum)
Discharge Summary    Patient ID: Anthony Woods,  MRN: [id number], DOB/AGE: Sep 03, 1955 62 y.o.  Admit date: 08/31/2017 Discharge date: 08/31/2017  Primary Care Provider: Cassandria Anger Primary Cardiologist: Dr. Angelena Form  Discharge Diagnoses    Active Problems:   Coronary artery disease involving native coronary artery of native heart with unstable angina pectoris (HCC)   Allergies Allergies  Allergen Reactions  . Bee Venom Anaphylaxis  . Actos [Pioglitazone Hydrochloride] Swelling  . Lipitor [Atorvastatin] Other (See Comments)    Patient cannot recall reaction  . Lisinopril Other (See Comments)    cough    Diagnostic Studies/Procedures    Cath: 08/31/17  Conclusion     Mid LAD lesion is 20% stenosed.  Mid RCA lesion is 40% stenosed.  Previously placed Mid Cx drug eluting stent is widely patent.  Prox Cx to Mid Cx lesion is 80% stenosed.  A drug-eluting stent was successfully placed using a STENT SYNERGY DES [id number], postdilated to 3.5 mm.  Post intervention, there is a 0% residual stenosis.  The left ventricular systolic function is normal.  LV end diastolic pressure is normal.  The left ventricular ejection fraction is 55-65% by visual estimate.  There is no aortic valve stenosis.   Recommend to resume Rivaroxaban, at currently prescribed dose and frequency, on 09/01/17.  Recommend concurrent antiplatelet therapy of Aspirin 81mg  daily for 1 month and Clopidogrel 75mg  daily for 12 months.   He needs to stop smoking.  Continue aggressive secondary prevention.   Plan for same day discharge.  _____________   History of Present Illness     62 yo male with history of atrial fibrillation, CAD, HTN, DM, obesity and tobacco abuse who presented to the office on 08/29/17 for cardiac follow up. [facility] with chest pain and was found to be in atrial fibrillation. Cardiac cath February 2018 with severe stenosis in the circumflex  treated with a Synergy drug eluting stent. He was discharged on Plavix and Xarelto. No beta blocker given bradycardia. Echo February 2018 with LVEF=50-55%. He was seen in the ED 08/18/17 with chest pain. He was standing and had onset of severe substernal chest pain, sharp at first and then heavy for an hour. Troponin negative in the ED. He was seen by Dr. Marlou Porch and was discharged home. Recurrence of severe chest pain on 08/27/17. He has noticed more dyspnea. Overall feeling poorly for 2 weeks. Given his symptoms the option of cardiac cath was discussed with the patient and he was agreeable to this plan. He was instructed to hold his Xarelto that day given plans for cath in 2 days.   Hospital Course     Underwent cardiac cath noted above with PCI/DES x1 to the mLCx. Plan for triple therapy with ASA/plavix/Xarelto for month, then plan to stop ASA at that time. He was continued on his home medications without further changes. Radial cath site stable prior to discharge. Given a work note prior to discharge. Informed to hold his metformin until 09/02/17 given use of contrast. Instructions/precautions prior to discharge. Education was given for cardiac rehab by the RN in short stay prior to discharge. He was encouraged to go, but stated he will likely not. Did not attend with previous stents. Given packet and cardiac rehab number prior to discharge.    Anthony Woods was seen by Dr. Irish Lack and determined stable for discharge home. Follow up in the office has been arranged. Medications are listed below.  _____________  Discharge Vitals Blood pressure (!) 156/84, pulse 68, temperature 98.4 F (36.9 C), resp. rate 14, height 5\' 5"  (1.651 m), weight 257 lb (116.6 kg), SpO2 96 %.  Filed Weights   08/31/17 1239  Weight: 257 lb (116.6 kg)    Labs & Radiologic Studies    CBC Recent Labs    08/29/17 1443  WBC 11.1*  HGB 15.4  HCT 45.5  MCV 93  PLT 628   Basic Metabolic Panel Recent Labs     08/29/17 1443  NA 141  K 4.2  CL 97  CO2 29  GLUCOSE 220*  BUN 11  CREATININE 1.04  CALCIUM 10.5*   Liver Function Tests No results for input(s): AST, ALT, ALKPHOS, BILITOT, PROT, ALBUMIN in the last 72 hours. No results for input(s): LIPASE, AMYLASE in the last 72 hours. Cardiac Enzymes No results for input(s): CKTOTAL, CKMB, CKMBINDEX, TROPONINI in the last 72 hours. BNP Invalid input(s): POCBNP D-Dimer No results for input(s): DDIMER in the last 72 hours. Hemoglobin A1C No results for input(s): HGBA1C in the last 72 hours. Fasting Lipid Panel No results for input(s): CHOL, HDL, LDLCALC, TRIG, CHOLHDL, LDLDIRECT in the last 72 hours. Thyroid Function Tests No results for input(s): TSH, T4TOTAL, T3FREE, THYROIDAB in the last 72 hours.  Invalid input(s): FREET3 _____________  Dg Chest 2 View  Result Date: 08/18/2017 CLINICAL DATA:  Chest pain EXAM: CHEST - 2 VIEW COMPARISON:  05/11/2016 FINDINGS: The heart size and mediastinal contours are within normal limits. Both lungs are clear. The visualized skeletal structures are unremarkable. IMPRESSION: No active cardiopulmonary disease. Electronically Signed   By: Franchot Gallo M.D.   On: 08/18/2017 11:24   Disposition   Pt is being discharged home today in good condition.  Follow-up Plans & Appointments    Follow-up Information    Consuelo Pandy, PA-C Follow up on 09/15/2017.   Specialties:  Cardiology, Radiology Why:  at 9:30am for your follow up appt.  Contact information: [place] [address] [phone]          Discharge Instructions    AMB Referral to Cardiac Rehabilitation - Phase II   Complete by:  As directed    Diagnosis:  Coronary Stents      Discharge Medications     Medication List    TAKE these medications   aspirin EC 81 MG tablet Take 1 tablet (81 mg total) by mouth daily.   atenolol-chlorthalidone 100-25 MG tablet Commonly known as:  TENORETIC Take 1  tablet by mouth daily.   BENADRYL 25 mg capsule Generic drug:  diphenhydrAMINE Take 25 mg by mouth as needed (bee stings).   clopidogrel 75 MG tablet Commonly known as:  PLAVIX Take 1 tablet (75 mg total) by mouth daily with breakfast.   EPINEPHrine 0.3 mg/0.3 mL Soaj injection Commonly known as:  EPI-PEN Inject 0.3 mg into the muscle once as needed (for bee stings).   escitalopram 5 MG tablet Commonly known as:  LEXAPRO TAKE 1 TABLET BY MOUTH DAILY. What changed:    how much to take  how to take this  when to take this   HUMALOG MIX 75/25 KWIKPEN (75-25) 100 UNIT/ML Kwikpen Generic drug:  Insulin Lispro Prot & Lispro Inject 10-50 Units into the skin See admin instructions. Inject 50 units in the morning and 10 units at night   Insulin Pen Needle 32G X 4 MM Misc Commonly known as:  ULTICARE MICRO PEN NEEDLES use with  insulin pen to inject insulin 2x daily.   isosorbide mononitrate 30 MG 24 hr tablet Commonly known as:  IMDUR Take 0.5 tablets (15 mg total) by mouth daily.   levothyroxine 175 MCG tablet Commonly known as:  SYNTHROID, LEVOTHROID TAKE 1 TABLET (175 MCG TOTAL) BY MOUTH DAILY.   linagliptin 5 MG Tabs tablet Commonly known as:  TRADJENTA Take 1 tablet (5 mg total) by mouth daily.   losartan 100 MG tablet Commonly known as:  COZAAR TAKE 1 TABLET BY MOUTH DAILY.   metFORMIN 1000 MG tablet Commonly known as:  GLUCOPHAGE Take 1 tablet (1,000 mg total) by mouth 2 (two) times daily with a meal.   nitroGLYCERIN 0.4 MG SL tablet Commonly known as:  NITROSTAT Place 1 tablet (0.4 mg total) under the tongue every 5 (five) minutes as needed for chest pain.   potassium chloride 10 MEQ tablet Commonly known as:  K-DUR TAKE 1 TABLET BY MOUTH 2 TIMES DAILY. What changed:    how much to take  how to take this  when to take this   predniSONE 10 MG tablet Commonly known as:  DELTASONE Take 10 mg by mouth daily as needed (if stung by bee).   rivaroxaban  20 MG Tabs tablet Commonly known as:  XARELTO Take 1 tablet (20 mg total) by mouth daily.   rosuvastatin 20 MG tablet Commonly known as:  CRESTOR Take 1 tablet (20 mg total) by mouth at bedtime.        Aspirin prescribed at discharge?  Yes High Intensity Statin Prescribed? (Lipitor 40-80mg  or Crestor 20-40mg ): Yes Beta Blocker Prescribed? Yes For EF <40%, was ACEI/ARB Prescribed? Yes ADP Receptor Inhibitor Prescribed? (i.e. Plavix etc.-Includes Medically Managed Patients): Yes For EF <40%, Aldosterone Inhibitor Prescribed? No: EF ok Was EF assessed during THIS hospitalization? Yes Was Cardiac Rehab II ordered? (Included Medically managed Patients): Yes   Outstanding Labs/Studies   N/a   Duration of Discharge Encounter   Greater than 30 minutes including physician time.  Signed, Reino Bellis NP-C 08/31/2017, 3:36 PM   I have examined the patient and reviewed assessment and plan and discussed with patient.  Agree with above as stated.  I checked the patient's wrist several hours post procedure and there was no hematoma.  No bruising.  COntinue aggressive secondary prevention.  Aspirin for one month starting tomorrow.  Plavix and Xarelto starting tomorrow.  Plavix for 12 months minimum.  Xarelto indefinitely for AFib.  F/u with Dr. Angelena Form.  Larae Grooms

## 2017-08-31 NOTE — Telephone Encounter (Signed)
**Note De-identified  Obfuscation** The pt has not been discharged from the hospital yet. Will call later this week. 

## 2017-08-31 NOTE — Interval H&P Note (Signed)
Cath Lab Visit (complete for each Cath Lab visit)  Clinical Evaluation Leading to the Procedure:   ACS: Yes.    Non-ACS:    Anginal Classification: CCS IV  Anti-ischemic medical therapy: Minimal Therapy (1 class of medications)  Non-Invasive Test Results: No non-invasive testing performed  Prior CABG: No previous CABG      History and Physical Interval Note:  08/31/2017 1:55 PM  Kamarian A Fabel  has presented today for surgery, with the diagnosis of cad   [place]  The various methods of treatment have been discussed with the patient and family. After consideration of risks, benefits and other options for treatment, the patient has consented to  Procedure(s): LEFT HEART CATH AND CORONARY ANGIOGRAPHY (N/A) as a surgical intervention .  The patient's history has been reviewed, patient examined, no change in status, stable for surgery.  I have reviewed the patient's chart and labs.  Questions were answered to the patient's satisfaction.     Larae Grooms

## 2017-08-31 NOTE — Telephone Encounter (Signed)
NEW MESSAGE    TOC  appt  6/27 @9 :30 [place]

## 2017-08-31 NOTE — Discharge Instructions (Signed)
He may return to work on Monday 09/05/17. Restart Xarelto 20 mg daily and Plavix 75 mg daily, tomorrow. Continue these medicines indefinitely.  Add aspirin 81 mg daily starting tomorrow for 1 month.  Stop aspirin after 1 month but continue Xarelto and Plavix.   Radial Site Care Refer to this sheet in the next few weeks. These instructions provide you with information about caring for yourself after your procedure. Your health care provider may also give you more specific instructions. Your treatment has been planned according to current medical practices, but problems sometimes occur. Call your health care provider if you have any problems or questions after your procedure. What can I expect after the procedure? After your procedure, it is typical to have the following:  Bruising at the radial site that usually fades within 1-2 weeks.  Blood collecting in the tissue (hematoma) that may be painful to the touch. It should usually decrease in size and tenderness within 1-2 weeks.  Follow these instructions at home:  Take medicines only as directed by your health care provider.  You may shower 24-48 hours after the procedure or as directed by your health care provider. Remove the bandage (dressing) and gently wash the site with plain soap and water. Pat the area dry with a clean towel. Do not rub the site, because this may cause bleeding.  Do not take baths, swim, or use a hot tub until your health care provider approves.  Check your insertion site every day for redness, swelling, or drainage.  Do not apply powder or lotion to the site.  Do not flex or bend the affected arm for 24 hours or as directed by your health care provider.  Do not push or pull heavy objects with the affected arm for 24 hours or as directed by your health care provider.  Do not lift over 10 lb (4.5 kg) for 5 days after your procedure or as directed by your health care provider.  Ask your health care provider when it  is okay to: ? Return to work or school. ? Resume usual physical activities or sports. ? Resume sexual activity.  Do not drive home if you are discharged the same day as the procedure. Have someone else drive you.  You may drive 24 hours after the procedure unless otherwise instructed by your health care provider.  Do not operate machinery or power tools for 24 hours after the procedure.  If your procedure was done as an outpatient procedure, which means that you went home the same day as your procedure, a responsible adult should be with you for the first 24 hours after you arrive home.  Keep all follow-up visits as directed by your health care provider. This is important. Contact a health care provider if:  You have a fever.  You have chills.  You have increased bleeding from the radial site. Hold pressure on the site. Get help right away if:  You have unusual pain at the radial site.  You have redness, warmth, or swelling at the radial site.  You have drainage (other than a small amount of blood on the dressing) from the radial site.  The radial site is bleeding, and the bleeding does not stop after 30 minutes of holding steady pressure on the site.  Your arm or hand becomes pale, cool, tingly, or numb. This information is not intended to replace advice given to you by your health care provider. Make sure you discuss any questions you have with  your health care provider. Document Released: 04/10/2010 Document Revised: 08/14/2015 Document Reviewed: 09/24/2013 Elsevier Interactive Patient Education  2018 [employer].

## 2017-08-31 NOTE — Progress Notes (Signed)
Dr Irish Lack in and okay to d/c home per Dr Irish Lack

## 2017-09-01 ENCOUNTER — Encounter (HOSPITAL_COMMUNITY): Payer: Self-pay | Admitting: Interventional Cardiology

## 2017-09-01 MED FILL — Heparin Sod (Porcine)-NaCl IV Soln 1000 Unit/500ML-0.9%: INTRAVENOUS | Qty: 1000 | Status: AC

## 2017-09-01 MED FILL — Nitroglycerin IV Soln 100 MCG/ML in D5W: INTRA_ARTERIAL | Qty: 10 | Status: AC

## 2017-09-01 NOTE — Telephone Encounter (Signed)
TCM 1st attempt  Left message to call back

## 2017-09-02 ENCOUNTER — Telehealth (HOSPITAL_COMMUNITY): Payer: Self-pay

## 2017-09-02 ENCOUNTER — Encounter: Payer: Self-pay | Admitting: Cardiology

## 2017-09-02 NOTE — Telephone Encounter (Signed)
Patients insurance is active and benefits verified through [facility] - $25.00 co-pay, no deductible, out of pocket amount of $3,000/$712.89 has been met, no co-insurance, and no pre-authorization is required. Passport/reference [phone]  Will contact patient to see if he is interested in the Cardiac Rehab Program. If interested, patient will be contacted for scheduling upon review by the RN Navigator.

## 2017-09-02 NOTE — Telephone Encounter (Signed)
Patient contacted regarding discharge from [facility] on 08/31/17.  Patient understands to follow up with provider Ellen Henri PA-C on 09/15/17 at 0930 at [facility]. Patient understands discharge instructions? yes Patient understands medications and regiment? yes Patient understands to bring all medications to this visit? yes  Pt had no further questions at this time and was very appreciative for all the assistance and follow-up provided .

## 2017-09-09 ENCOUNTER — Encounter: Payer: Self-pay | Admitting: Gastroenterology

## 2017-09-12 ENCOUNTER — Telehealth (HOSPITAL_COMMUNITY): Payer: Self-pay

## 2017-09-12 NOTE — Telephone Encounter (Signed)
Called and spoke with wife of patient in regards to Cardiac Rehab. Wife stated they have talked about the program but are unsure due to financial reasons. Explained to wife the scheduling process and has time to think about it. Will contact patient to follow up upon review by the RN Navigator.

## 2017-09-14 ENCOUNTER — Ambulatory Visit (INDEPENDENT_AMBULATORY_CARE_PROVIDER_SITE_OTHER): Payer: 59 | Admitting: Internal Medicine

## 2017-09-14 ENCOUNTER — Other Ambulatory Visit (INDEPENDENT_AMBULATORY_CARE_PROVIDER_SITE_OTHER): Payer: 59

## 2017-09-14 ENCOUNTER — Encounter: Payer: Self-pay | Admitting: Internal Medicine

## 2017-09-14 VITALS — BP 132/78 | HR 58 | Temp 98.2°F | Ht 65.0 in | Wt 258.0 lb

## 2017-09-14 DIAGNOSIS — E1165 Type 2 diabetes mellitus with hyperglycemia: Secondary | ICD-10-CM | POA: Diagnosis not present

## 2017-09-14 DIAGNOSIS — I2510 Atherosclerotic heart disease of native coronary artery without angina pectoris: Secondary | ICD-10-CM

## 2017-09-14 DIAGNOSIS — F17200 Nicotine dependence, unspecified, uncomplicated: Secondary | ICD-10-CM

## 2017-09-14 DIAGNOSIS — Z0000 Encounter for general adult medical examination without abnormal findings: Secondary | ICD-10-CM

## 2017-09-14 DIAGNOSIS — I25110 Atherosclerotic heart disease of native coronary artery with unstable angina pectoris: Secondary | ICD-10-CM | POA: Diagnosis not present

## 2017-09-14 DIAGNOSIS — Z23 Encounter for immunization: Secondary | ICD-10-CM

## 2017-09-14 LAB — LIPID PANEL
CHOL/HDL RATIO: 4
Cholesterol: 125 mg/dL (ref 0–200)
HDL: 34.2 mg/dL — ABNORMAL LOW (ref >39.00)
LDL Cholesterol: 53 mg/dL (ref 0–99)
NONHDL: 90.72
TRIGLYCERIDES: 187 mg/dL — AB (ref 0.0–149.0)
VLDL: 37.4 mg/dL (ref 0.0–40.0)

## 2017-09-14 LAB — URINALYSIS, ROUTINE W REFLEX MICROSCOPIC
BILIRUBIN URINE: NEGATIVE
Hgb urine dipstick: NEGATIVE
KETONES UR: NEGATIVE
LEUKOCYTES UA: NEGATIVE
NITRITE: NEGATIVE
RBC / HPF: NONE SEEN (ref 0–?)
Specific Gravity, Urine: 1.015 (ref 1.000–1.030)
Total Protein, Urine: 30 — AB
UROBILINOGEN UA: 0.2 (ref 0.0–1.0)
Urine Glucose: 250 — AB
pH: 7.5 (ref 5.0–8.0)

## 2017-09-14 LAB — HEPATIC FUNCTION PANEL
ALK PHOS: 56 U/L (ref 39–117)
ALT: 21 U/L (ref 0–53)
AST: 19 U/L (ref 0–37)
Albumin: 4.1 g/dL (ref 3.5–5.2)
BILIRUBIN DIRECT: 0.1 mg/dL (ref 0.0–0.3)
TOTAL PROTEIN: 6.4 g/dL (ref 6.0–8.3)
Total Bilirubin: 0.4 mg/dL (ref 0.2–1.2)

## 2017-09-14 LAB — BASIC METABOLIC PANEL
BUN: 11 mg/dL (ref 6–23)
CHLORIDE: 100 meq/L (ref 96–112)
CO2: 29 meq/L (ref 19–32)
Calcium: 9.7 mg/dL (ref 8.4–10.5)
Creatinine, Ser: 0.94 mg/dL (ref 0.40–1.50)
GFR: 86.67 mL/min (ref >60.00)
Glucose, Bld: 211 mg/dL — ABNORMAL HIGH (ref 70–99)
Potassium: 3.6 mEq/L (ref 3.5–5.1)
SODIUM: 138 meq/L (ref 135–145)

## 2017-09-14 LAB — PSA: PSA: 0.41 ng/mL (ref 0.10–4.00)

## 2017-09-14 LAB — TSH: TSH: 1.25 u[IU]/mL (ref 0.35–4.50)

## 2017-09-14 LAB — HEMOGLOBIN A1C: Hgb A1c MFr Bld: 10.8 % — ABNORMAL HIGH (ref 4.6–6.5)

## 2017-09-14 NOTE — Assessment & Plan Note (Signed)
Trying to quit smoking. 

## 2017-09-14 NOTE — Assessment & Plan Note (Signed)
  62yo male with history of atrial fibrillation, CAD, HTN, DM, obesity and tobacco abuse who presented to the office on 08/29/17 for cardiac follow up. He was admitted to [facility] February 2018 with chest pain and was found to be in atrial fibrillation. 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 He was discharged on Plavix and Xarelto. No beta blocker given bradycardia. Echo February 2018 with LVEF=50-55%. He was seen in the ED 08/18/17 with chest pain. He was standing and had onset of severe substernal chest pain, sharp at first and then heavy for an hour. Troponin negative in the ED. He was seen by Dr. Marlou Porch and was discharged home. Recurrence of severe chest pain on 08/27/17. He has noticed more dyspnea.Overall feeling poorly for 2 weeks.Given his symptoms the option of cardiac cath was discussed with the patient and he was agreeable to this plan. He was instructed to hold his Xarelto that day given plans for cath in 2 days.   Hospital Course     Underwent cardiac cath noted above with PCI/DES x1 to the mLCx. Plan for triple therapy with ASA/plavix/Xarelto for month, then plan to stop ASA at that time. He was continued on his home medications without further changes. Radial cath site stable prior to discharge. Given a work note prior to discharge. Informed to hold his metformin until 09/02/17 given use of contrast. Instructions/precautions prior to discharge. Education was given for cardiac rehab by the RN in short stay prior to discharge. He was encouraged to go, but stated he will likely not. Did not attend with previous stents. Given packet and cardiac rehab number prior to discharge.

## 2017-09-14 NOTE — Assessment & Plan Note (Signed)
Wt Readings from Last 3 Encounters:  09/14/17 258 lb (117 kg)  08/31/17 257 lb (116.6 kg)  08/29/17 257 lb 3.2 oz (116.7 kg)

## 2017-09-14 NOTE — Assessment & Plan Note (Signed)
Dr Chalmers Cater - appt in August

## 2017-09-14 NOTE — Assessment & Plan Note (Signed)
We discussed age appropriate health related issues, including available/recomended screening tests and vaccinations. We discussed a need for adhering to healthy diet and exercise. Labs were ordered to be later reviewed . All questions were answered.   

## 2017-09-14 NOTE — Progress Notes (Signed)
Subjective:  Patient ID: [facility], male    DOB: 11/16/1955  Age: 62 y.o. MRN: [id number]  CC: No chief complaint on file.   HPI [facility] presents for CAD, smoking f/u Well exam  Hosp d/c on 08/31/17: "62yo male with history of atrial fibrillation, CAD, HTN, DM, obesity and tobacco abuse who presented to the office on 08/29/17 for cardiac follow up. He was admitted to [facility] February 2018 with chest pain and was found to be in atrial fibrillation. 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 He was discharged on Plavix and Xarelto. No beta blocker given bradycardia. Echo February 2018 with LVEF=50-55%. He was seen in the ED 08/18/17 with chest pain. He was standing and had onset of severe substernal chest pain, sharp at first and then heavy for an hour. Troponin negative in the ED. He was seen by Dr. Marlou [facility] and was discharged home. Recurrence of severe chest pain on 08/27/17. He has noticed more dyspnea.Overall feeling poorly for 2 weeks.Given his symptoms the option of cardiac cath was discussed with the patient and he was agreeable to this plan. He was instructed to hold his Xarelto that day given plans for cath in 2 days.   [facility] Course     Underwent cardiac cath noted above with PCI/DES x1 to the mLCx. Plan for triple therapy with ASA/plavix/Xarelto for month, then plan to stop ASA at that time. He was continued on his home medications without further changes. Radial cath site stable prior to discharge. Given a work note prior to discharge. Informed to hold his metformin until 09/02/17 given use of contrast. Instructions/precautions prior to discharge. Education was given for cardiac rehab by the RN in short stay prior to discharge. He was encouraged to go, but stated he will likely not. Did not attend with previous stents. Given packet and cardiac rehab number prior to discharge."      Outpatient Medications Prior to  Visit  Medication Sig Dispense Refill  . aspirin EC 81 MG tablet Take 1 tablet (81 mg total) by mouth daily. 90 tablet 3  . atenolol-chlorthalidone (TENORETIC) 100-25 MG tablet Take 1 tablet by mouth daily. 30 tablet 11  . clopidogrel (PLAVIX) 75 MG tablet Take 1 tablet (75 mg total) by mouth daily with breakfast. 30 tablet 11  . diphenhydrAMINE (BENADRYL) 25 mg capsule Take 25 mg by mouth as needed (bee stings).    . EPINEPHrine 0.3 mg/0.3 mL IJ SOAJ injection Inject 0.3 mg into the muscle once as needed (for bee stings).    Marland Kitchen escitalopram (LEXAPRO) 5 MG tablet TAKE 1 TABLET BY MOUTH DAILY. (Patient taking differently: TAKE 1 TABLET (5mg )  BY MOUTH DAILY.) 30 tablet 5  . HUMALOG MIX 75/25 KWIKPEN (75-25) 100 UNIT/ML Kwikpen Inject 10-50 Units into the skin See admin instructions. Inject 50 units in the morning and 10 units at night    . Insulin Pen Needle (ULTICARE MICRO PEN NEEDLES) 32G X 4 MM MISC use with insulin pen to inject insulin 2x daily. 100 each PRN  . isosorbide mononitrate (IMDUR) 30 MG 24 hr tablet Take 0.5 tablets (15 mg total) by mouth daily. 30 tablet 1  . levothyroxine (SYNTHROID, LEVOTHROID) 175 MCG tablet TAKE 1 TABLET (175 MCG TOTAL) BY MOUTH DAILY. 90 tablet 1  . linagliptin (TRADJENTA) 5 MG TABS tablet Take 1 tablet (5 mg total) by mouth daily. 90 tablet 3  . losartan (COZAAR) 100 MG  tablet TAKE 1 TABLET BY MOUTH DAILY. 90 tablet 1  . metFORMIN (GLUCOPHAGE) 1000 MG tablet Take 1 tablet (1,000 mg total) by mouth 2 (two) times daily with a meal. 180 tablet 3  . nitroGLYCERIN (NITROSTAT) 0.4 MG SL tablet Place 1 tablet (0.4 mg total) under the tongue every 5 (five) minutes as needed for chest pain. 25 tablet 6  . potassium chloride (K-DUR) 10 MEQ tablet TAKE 1 TABLET BY MOUTH 2 TIMES DAILY. (Patient taking differently: TAKE 1 TABLET (10MEQ)  BY MOUTH 2 TIMES DAILY.) 60 tablet 11  . predniSONE (DELTASONE) 10 MG tablet Take 10 mg by mouth daily as needed (if stung by bee).    .  rivaroxaban (XARELTO) 20 MG TABS tablet Take 1 tablet (20 mg total) by mouth daily. 30 tablet 11  . rosuvastatin (CRESTOR) 20 MG tablet Take 1 tablet (20 mg total) by mouth at bedtime. 30 tablet 2   No facility-administered medications prior to visit.     ROS: Review of Systems  Constitutional: Negative for appetite change, fatigue and unexpected weight change.  HENT: Negative for congestion, nosebleeds, sneezing, sore throat and trouble swallowing.   Eyes: Negative for itching and visual disturbance.  Respiratory: Negative for cough.   Cardiovascular: Positive for chest pain. Negative for palpitations and leg swelling.  Gastrointestinal: Negative for abdominal distention, blood in stool, diarrhea and nausea.  Genitourinary: Negative for frequency and hematuria.  Musculoskeletal: Positive for arthralgias. Negative for back pain, gait problem, joint swelling and neck pain.  Skin: Negative for rash.  Neurological: Negative for dizziness, tremors, speech difficulty and weakness.  Psychiatric/Behavioral: Negative for agitation, dysphoric mood and sleep disturbance. The patient is not nervous/anxious.     Objective:  BP 132/78 (BP Location: Left Arm, Patient Position: Sitting, Cuff Size: Large)   Pulse (!) 58   Temp 98.2 F (36.8 C) (Oral)   Ht 5\' 5"  (1.651 m)   Wt 258 lb (117 kg)   SpO2 97%   BMI 42.93 kg/m   BP Readings from Last 3 Encounters:  09/14/17 132/78  08/31/17 (!) 157/77  08/29/17 138/70    Wt Readings from Last 3 Encounters:  09/14/17 258 lb (117 kg)  08/31/17 257 lb (116.6 kg)  08/29/17 257 lb 3.2 oz (116.7 kg)    Physical Exam  Constitutional: He is oriented to person, place, and time. He appears well-developed. No distress.  NAD  HENT:  Mouth/Throat: Oropharynx is clear and moist.  Eyes: Pupils are equal, round, and reactive to light. Conjunctivae are normal.  Neck: Normal range of motion. No JVD present. No thyromegaly present.  Cardiovascular: Normal  rate, regular rhythm, normal heart sounds and intact distal pulses. Exam reveals no gallop and no friction rub.  No murmur heard. Pulmonary/Chest: Effort normal and breath sounds normal. No respiratory distress. He has no wheezes. He has no rales. He exhibits no tenderness.  Abdominal: Soft. Bowel sounds are normal. He exhibits no distension and no mass. There is no tenderness. There is no rebound and no guarding.  Genitourinary: Rectum normal and prostate normal. Rectal exam shows guaiac negative stool.  Musculoskeletal: Normal range of motion. He exhibits no edema or tenderness.  Lymphadenopathy:    He has no cervical adenopathy.  Neurological: He is alert and oriented to person, place, and time. He has normal reflexes. No cranial nerve deficit. He exhibits normal muscle tone. He displays a negative Romberg sign. Coordination and gait normal.  Skin: Skin is warm and dry. No rash noted.  Psychiatric: He has a normal mood and affect. His behavior is normal. Judgment and thought content normal.    Lab Results  Component Value Date   WBC 11.1 (H) 08/29/2017   HGB 15.4 08/29/2017   HCT 45.5 08/29/2017   PLT 222 08/29/2017   GLUCOSE 220 (H) 08/29/2017   CHOL 170 04/14/2015   TRIG 163.0 (H) 04/14/2015   HDL 37.60 (L) 04/14/2015   LDLDIRECT 100.0 12/09/2014   LDLCALC 100 (H) 04/14/2015   ALT 24 06/04/2016   AST 21 06/04/2016   NA 141 08/29/2017   K 4.2 08/29/2017   CL 97 08/29/2017   CREATININE 1.04 08/29/2017   BUN 11 08/29/2017   CO2 29 08/29/2017   TSH 2.300 05/12/2016   PSA 0.46 04/14/2015   INR 1.12 05/12/2016   HGBA1C 9.1 (H) 03/16/2017   MICROALBUR 4.7 (H) 12/06/2013    No results found.  Assessment & Plan:   There are no diagnoses linked to this encounter.   No orders of the defined types were placed in this encounter.    Follow-up: No follow-ups on file.  Walker Kehr, MD

## 2017-09-14 NOTE — Assessment & Plan Note (Signed)
1/4 PPD now Chantix

## 2017-09-15 ENCOUNTER — Ambulatory Visit: Payer: 59 | Admitting: Cardiology

## 2017-09-15 ENCOUNTER — Encounter: Payer: Self-pay | Admitting: Cardiology

## 2017-09-15 VITALS — BP 150/78 | HR 60 | Ht 65.0 in | Wt 256.0 lb

## 2017-09-15 DIAGNOSIS — I739 Peripheral vascular disease, unspecified: Secondary | ICD-10-CM | POA: Diagnosis not present

## 2017-09-15 DIAGNOSIS — Z720 Tobacco use: Secondary | ICD-10-CM | POA: Diagnosis not present

## 2017-09-15 DIAGNOSIS — E11649 Type 2 diabetes mellitus with hypoglycemia without coma: Secondary | ICD-10-CM

## 2017-09-15 MED ORDER — ISOSORBIDE MONONITRATE ER 30 MG PO TB24: 30.0000 mg | ORAL_TABLET | Freq: Every day | ORAL | 1 refills | Status: DC

## 2017-09-15 NOTE — Progress Notes (Signed)
09/15/2017 Anthony Woods   Apr 07, 1955  [id number]  Primary Physician Plotnikov, Evie Lacks, MD Primary Cardiologist: Dr. Angelena Form   Reason for Visit/CC: F/u for CAD s/p Cardiac Cath w/ angioplasty   HPI:  62yo male with history of atrial fibrillation, CAD, HTN, DM, obesity and tobacco abuse who presents to clinic today for post hospital f/u for CAD.  He was admitted to [facility] in February 2018 with chest pain and was found to be in atrial fibrillation. Cardiac cath February 2018 showed severe stenosis in the circumflex treated with a Synergy drug eluting stent. He was discharged on Plavix and Xarelto. No beta blocker given bradycardia. Echo February 2018 with LVEF=50-55%.  Most recently, he developed recurrent CP c/w unstable angina and underwent repeat cardiac cath on 08/31/17 by Dr. Irish Lack. His Xarelto was held 2 days prior to cath. Cath showed new 80% stenosis in the proximal to mid LCx. The previously placed mid LCx stent was widely patent. He underwent successful PCI + DES to the proximal to mid CX. There was mild residual disease in the mid LAD (20%) and mid RCA (40%). LVEF was normal. He was placed on ASA and Plavix and instructed to resume Xarelto the next day (pt was same day d/c). Dr. Irish Lack recommended triple therapy x 1 month. ASA will be discontinued after 30 days. He will continue on Plavix x 12 months and Xarelto continued indefinitely.   Also of note, pt had FLP yesterday at PCP office which showed controlled LDL at 53 mg/dL. Hgb A1c however was elevated at 10.8. UA also ordered showing significant levels of urine glucose and protein. SCr normal at 0.94. GFR 86. He is on an ARB, losartan. He is on Crestor 20 and also on multiple DM medications. He is also a current smoker.   He is here today with his wife. Notes that he has done well. No recurrent chest pain. No dyspnea. Reports full med compliance. No abnormal bleeding. Reports he spoke with PCP yesterday about starting Chantix.  Will plan to start September 19 2017. No post cath complications. Right arm/ hand ok.   Pt also commented on poster in exam room highlighting PAD. He notes "I have those symptoms and those medical problems". He confirms bilateral leg pain with exercise.   Procedures   CORONARY STENT INTERVENTION 08/31/17  LEFT HEART CATH AND CORONARY ANGIOGRAPHY  Conclusion     Mid LAD lesion is 20% stenosed.  Mid RCA lesion is 40% stenosed.  Previously placed Mid Cx drug eluting stent is widely patent.  Prox Cx to Mid Cx lesion is 80% stenosed.  A drug-eluting stent was successfully placed using a STENT SYNERGY DES 3X24, postdilated to 3.5 mm.  Post intervention, there is a 0% residual stenosis.  The left ventricular systolic function is normal.  LV end diastolic pressure is normal.  The left ventricular ejection fraction is 55-65% by visual estimate.  There is no aortic valve stenosis.   Recommend to resume Rivaroxaban, at currently prescribed dose and frequency, on 09/01/17.  Recommend concurrent antiplatelet therapy of Aspirin 81mg  daily for 1 month and Clopidogrel 75mg  daily for 12 months.   He needs to stop smoking.  Continue aggressive secondary prevention.    Coronary Diagrams   Diagnostic Diagram       Post-Intervention Diagram           Current Meds  Medication Sig  . aspirin EC 81 MG tablet Take 1 tablet (81 mg total) by mouth daily.  Marland Kitchen  atenolol-chlorthalidone (TENORETIC) 100-25 MG tablet Take 1 tablet by mouth daily.  . clopidogrel (PLAVIX) 75 MG tablet Take 1 tablet (75 mg total) by mouth daily with breakfast.  . diphenhydrAMINE (BENADRYL) 25 mg capsule Take 25 mg by mouth as needed (bee stings).  . EPINEPHrine 0.3 mg/0.3 mL IJ SOAJ injection Inject 0.3 mg into the muscle once as needed (for bee stings).  Marland Kitchen escitalopram (LEXAPRO) 5 MG tablet TAKE 1 TABLET BY MOUTH DAILY. (Patient taking differently: TAKE 1 TABLET (5mg )  BY MOUTH DAILY.)  . HUMALOG MIX 75/25 KWIKPEN  (75-25) 100 UNIT/ML Kwikpen Inject 10-50 Units into the skin See admin instructions. Inject 50 units in the morning and 10 units at night  . Insulin Pen Needle (ULTICARE MICRO PEN NEEDLES) 32G X 4 MM MISC use with insulin pen to inject insulin 2x daily.  . isosorbide mononitrate (IMDUR) 30 MG 24 hr tablet Take 1 tablet (30 mg total) by mouth daily.  Marland Kitchen levothyroxine (SYNTHROID, LEVOTHROID) 175 MCG tablet TAKE 1 TABLET (175 MCG TOTAL) BY MOUTH DAILY.  Marland Kitchen linagliptin (TRADJENTA) 5 MG TABS tablet Take 1 tablet (5 mg total) by mouth daily.  Marland Kitchen losartan (COZAAR) 100 MG tablet TAKE 1 TABLET BY MOUTH DAILY.  . metFORMIN (GLUCOPHAGE) 1000 MG tablet Take 1 tablet (1,000 mg total) by mouth 2 (two) times daily with a meal.  . nitroGLYCERIN (NITROSTAT) 0.4 MG SL tablet Place 1 tablet (0.4 mg total) under the tongue every 5 (five) minutes as needed for chest pain.  . potassium chloride (K-DUR) 10 MEQ tablet TAKE 1 TABLET BY MOUTH 2 TIMES DAILY. (Patient taking differently: TAKE 1 TABLET (10MEQ)  BY MOUTH 2 TIMES DAILY.)  . predniSONE (DELTASONE) 10 MG tablet Take 10 mg by mouth daily as needed (if stung by bee).  . rivaroxaban (XARELTO) 20 MG TABS tablet Take 1 tablet (20 mg total) by mouth daily.  . rosuvastatin (CRESTOR) 20 MG tablet Take 1 tablet (20 mg total) by mouth at bedtime.  . [DISCONTINUED] isosorbide mononitrate (IMDUR) 30 MG 24 hr tablet Take 0.5 tablets (15 mg total) by mouth daily.   Allergies  Allergen Reactions  . Bee Venom Anaphylaxis  . Actos [Pioglitazone Hydrochloride] Swelling  . Lipitor [Atorvastatin] Other (See Comments)    Patient cannot recall reaction  . Lisinopril Other (See Comments)    cough   Past Medical History:  Diagnosis Date  . Allergy to bee sting   . Arthritis    "right knee" (05/12/2016)  . CAD (coronary artery disease)   . Headache    "when his sugar goes too high or too low" (05/12/2016)  . HTN (hypertension)   . Hyperlipidemia   . Hypothyroidism   . Macular  degeneration   . Obesity   . Type II diabetes mellitus (HCC)    Family History  Problem Relation Age of Onset  . Diabetes Father   . Cancer Father        stomach  . CVA Father   . Stomach cancer Father   . Diabetes Mother   . Cancer Mother        male ca  . Colon cancer Mother 11  . Hypertension Other   . Diabetes Other   . CAD Maternal Grandmother    Past Surgical History:  Procedure Laterality Date  . CARDIAC CATHETERIZATION    . CORONARY ANGIOPLASTY WITH STENT PLACEMENT  05/12/2016  . CORONARY STENT INTERVENTION N/A 05/12/2016   Procedure: Coronary Stent Intervention;  Surgeon: Jettie Booze, MD;  Location: [facility];  Service: Cardiovascular;  Laterality: N/A;  . CORONARY STENT INTERVENTION N/A 08/31/2017   Procedure: CORONARY STENT INTERVENTION;  Surgeon: Jettie Booze, MD;  Location: [facility];  Service: Cardiovascular;  Laterality: N/A;  . GANGLION CYST EXCISION Left 1980s  . KNEE ARTHROSCOPY Right    Meniscus removal  . LEFT HEART CATH AND CORONARY ANGIOGRAPHY N/A 05/12/2016   Procedure: Left Heart Cath and Coronary Angiography;  Surgeon: Jettie Booze, MD;  Location: [facility];  Service: Cardiovascular;  Laterality: N/A;  . LEFT HEART CATH AND CORONARY ANGIOGRAPHY N/A 08/31/2017   Procedure: LEFT HEART CATH AND CORONARY ANGIOGRAPHY;  Surgeon: Jettie Booze, MD;  Location: [facility];  Service: Cardiovascular;  Laterality: N/A;  . LEFT HEART CATHETERIZATION WITH CORONARY ANGIOGRAM N/A 02/04/2012   Procedure: LEFT HEART CATHETERIZATION WITH CORONARY ANGIOGRAM;  Surgeon: Burnell Blanks, MD;  Location: [facility];  Service: Cardiovascular;  Laterality: N/A;   Social History   Socioeconomic History  . Marital status: Married    Spouse name: Not on file  . Number of children: 1  . Years of education: Not on file  . Highest education level: Not on file  Occupational History  . Occupation: [employer]    . Occupation: GRAVE DIGGER    Employer: [employer]  . Financial resource strain: Not on file  . Food insecurity:    Worry: Not on file    Inability: Not on file  . Transportation needs:    Medical: Not on file    Non-medical: Not on file  Tobacco Use  . Smoking status: Former Smoker    Packs/day: 1.50    Years: 42.00    Pack years: 63.00    Types: Cigarettes  . Smokeless tobacco: Never Used  Substance and Sexual Activity  . Alcohol use: No  . Drug use: No  . Sexual activity: Not Currently  Lifestyle  . Physical activity:    Days per week: Not on file    Minutes per session: Not on file  . Stress: Not on file  Relationships  . Social connections:    Talks on phone: Not on file    Gets together: Not on file    Attends religious service: Not on file    Active member of club or organization: Not on file    Attends meetings of clubs or organizations: Not on file    Relationship status: Not on file  . Intimate partner violence:    Fear of current or ex partner: Not on file    Emotionally abused: Not on file    Physically abused: Not on file    Forced sexual activity: Not on file  Other Topics Concern  . Not on file  Social History Narrative  . Not on file     Review of Systems: General: negative for chills, fever, night sweats or weight changes.  Cardiovascular: negative for chest pain, dyspnea on exertion, edema, orthopnea, palpitations, paroxysmal nocturnal dyspnea or shortness of breath Dermatological: negative for rash Respiratory: negative for cough or wheezing Urologic: negative for hematuria Abdominal: negative for nausea, vomiting, diarrhea, bright red blood per rectum, melena, or hematemesis Neurologic: negative for visual changes, syncope, or dizziness All other systems reviewed and are otherwise negative except as noted above.   Physical Exam:  Blood pressure (!) 150/78, pulse 60, height 5\' 5"  (1.651 m), weight 256 lb (116.1 kg),  SpO2  94 %.  General appearance: alert, cooperative, no distress and moderately obese Neck: no carotid bruit and no JVD Lungs: clear to auscultation bilaterally Heart: regular rate and rhythm, S1, S2 normal, no murmur, click, rub or gallop Extremities: extremities normal, atraumatic, no cyanosis or edema Pulses: 2+ and symmetric Skin: Skin color, texture, turgor normal. No rashes or lesions Neurologic: Grossly normal  EKG not performed -- personally reviewed   ASSESSMENT AND PLAN:   1. CAD: Recent cath 08/31/17 showed new 80% stenosis in the proximal to mid LCx. The previously placed mid LCx stent from 2018 was widely patent. He underwent successful PCI + DES to the proximal to mid CX. There was mild residual disease in the mid LAD (20%) and mid RCA (40%). LVEF was normal.  Dr. Irish Lack recommended triple therapy x 1 month (ASA, Plavix and Xarelto). ASA will be discontinued after 30 days (stop date is 09/30/17). He will continue on Plavix x 12 months and Xarelto continued indefinitely. He denies any recurrent angina, but will further titrate Imdur to 30 mg for better BP control. No BB due to bradycardia. Continue statin. Smoking cessation strongly advised.   2. HTN: elevated at 150/78. Pt reports was better at PCP office yesterday at 062 systolic. He is maxed out on Losartan. Unable to tolerate BBs due to bradycardia. We will increase his Imdur to 30 mg daily.   3. DM: poorly controlled. Hgb A1c yesterday was 10.8. PCP adjusted insulin dose. Also will see his endocinologist in August. UA yesterday + for protein. He is on an ARB, losartan.   4. Tobacco Use: active smoker. Plans to start Chantix July 1.   5. Obesity: Body mass index is 42.6 kg/m.  We discussed importance of weight loss, exercise and diet modification.   6. LE Claudication: reports bilateral leg pain w/ exercise. Multiple other risk factors including known CAD, tobacco abuse, poorly controlled DM and HTN. Decreased DPs noted on  exam. Will order bilateral LEE arterial dopplers. If abnormal, will refer to either Dr. Gwenlyn Found or Dr. Fletcher Anon.   7. PAF: RRR on exam. Denies palpitations. Continue Xarelto for a/c.   Follow-Up w/ Dr. Angelena Form in 3 months.   Brittainy Ladoris Gene, MHS [facility] 09/15/2017 10:49 AM

## 2017-09-15 NOTE — Patient Instructions (Addendum)
Medication Instructions:   START TAKING IMDUR 30 MG ONCE A DAY   STOP TAKING ASPIRIN 81 MG  09-30-17  If you need a refill on your cardiac medications before your next appointment, please call your pharmacy.  Labwork: NONE ORDERED  TODAY   Testing/Procedures: Your physician has requested that you have a lower or upper extremity arterial duplex. This test is an ultrasound of the arteries in the legs or arms. It looks at arterial blood flow in the legs and arms. Allow one hour for Lower and Upper Arterial scans. There are no restrictions or special instructions    Follow-Up: IN 3 MONTHS WITH MCALHANY    Any Other Special Instructions Will Be Listed Below (If Applicable).

## 2017-09-16 ENCOUNTER — Encounter: Payer: Self-pay | Admitting: Gastroenterology

## 2017-09-19 ENCOUNTER — Other Ambulatory Visit: Payer: Self-pay | Admitting: Cardiology

## 2017-09-19 ENCOUNTER — Telehealth (HOSPITAL_COMMUNITY): Payer: Self-pay

## 2017-09-19 DIAGNOSIS — E11649 Type 2 diabetes mellitus with hypoglycemia without coma: Secondary | ICD-10-CM

## 2017-09-19 DIAGNOSIS — I739 Peripheral vascular disease, unspecified: Secondary | ICD-10-CM

## 2017-09-19 DIAGNOSIS — Z720 Tobacco use: Secondary | ICD-10-CM

## 2017-09-19 NOTE — Telephone Encounter (Signed)
Attempted to call patient in regards to Cardiac Rehab - LM on VM 

## 2017-09-24 ENCOUNTER — Other Ambulatory Visit: Payer: Self-pay | Admitting: Internal Medicine

## 2017-09-27 ENCOUNTER — Telehealth (HOSPITAL_COMMUNITY): Payer: Self-pay

## 2017-09-27 NOTE — Telephone Encounter (Signed)
Called and spoke with wife of patient in regards to Cardiac Rehab - Wife stated he will not be able to afford program. Closed referral.

## 2017-09-30 ENCOUNTER — Ambulatory Visit (HOSPITAL_COMMUNITY)
Admission: RE | Admit: 2017-09-30 | Discharge: 2017-09-30 | Disposition: A | Discharge status: Home / Self Care | Payer: 59 | Source: Ambulatory Visit | Attending: Cardiology | Admitting: Cardiology

## 2017-09-30 DIAGNOSIS — I739 Peripheral vascular disease, unspecified: Secondary | ICD-10-CM | POA: Diagnosis not present

## 2017-09-30 DIAGNOSIS — Z720 Tobacco use: Secondary | ICD-10-CM | POA: Diagnosis not present

## 2017-09-30 DIAGNOSIS — E11649 Type 2 diabetes mellitus with hypoglycemia without coma: Secondary | ICD-10-CM | POA: Diagnosis not present

## 2017-10-11 ENCOUNTER — Ambulatory Visit: Payer: Self-pay | Admitting: Physician Assistant

## 2017-10-24 DIAGNOSIS — E039 Hypothyroidism, unspecified: Secondary | ICD-10-CM | POA: Diagnosis not present

## 2017-10-24 DIAGNOSIS — I10 Essential (primary) hypertension: Secondary | ICD-10-CM | POA: Diagnosis not present

## 2017-10-24 DIAGNOSIS — E1165 Type 2 diabetes mellitus with hyperglycemia: Secondary | ICD-10-CM | POA: Diagnosis not present

## 2017-11-10 ENCOUNTER — Other Ambulatory Visit: Payer: Self-pay | Admitting: Cardiology

## 2017-11-10 NOTE — Telephone Encounter (Signed)
OK to refill

## 2017-11-10 NOTE — Telephone Encounter (Signed)
Pt's pharmacy is requesting a refill on rosuvastatin. Would Dr.Mcalhany like to refill this medication? Please address

## 2017-11-23 ENCOUNTER — Ambulatory Visit: Payer: Self-pay | Admitting: Gastroenterology

## 2017-12-08 DIAGNOSIS — E1165 Type 2 diabetes mellitus with hyperglycemia: Secondary | ICD-10-CM | POA: Diagnosis not present

## 2017-12-08 DIAGNOSIS — I10 Essential (primary) hypertension: Secondary | ICD-10-CM | POA: Diagnosis not present

## 2017-12-08 DIAGNOSIS — E039 Hypothyroidism, unspecified: Secondary | ICD-10-CM | POA: Diagnosis not present

## 2017-12-19 ENCOUNTER — Other Ambulatory Visit: Payer: Self-pay | Admitting: Internal Medicine

## 2017-12-20 ENCOUNTER — Ambulatory Visit: Payer: 59 | Admitting: Internal Medicine

## 2017-12-20 ENCOUNTER — Encounter: Payer: Self-pay | Admitting: Internal Medicine

## 2017-12-20 DIAGNOSIS — I4891 Unspecified atrial fibrillation: Secondary | ICD-10-CM

## 2017-12-20 DIAGNOSIS — E11649 Type 2 diabetes mellitus with hypoglycemia without coma: Secondary | ICD-10-CM

## 2017-12-20 DIAGNOSIS — Z566 Other physical and mental strain related to work: Secondary | ICD-10-CM

## 2017-12-20 DIAGNOSIS — I10 Essential (primary) hypertension: Secondary | ICD-10-CM | POA: Diagnosis not present

## 2017-12-20 MED ORDER — ESCITALOPRAM OXALATE 10 MG PO TABS: 10.0000 mg | ORAL_TABLET | Freq: Every day | ORAL | 3 refills | Status: DC

## 2017-12-20 NOTE — Assessment & Plan Note (Signed)
Losartan, Tenoretic

## 2017-12-20 NOTE — Assessment & Plan Note (Signed)
F/u w/Dr Chalmers Cater Humalog mix, Metformin 9/19 Ozempic

## 2017-12-20 NOTE — Patient Instructions (Signed)
Sign up for [employer] [employer] library ( via Libby app on your phone or your ipad). If you don't have a library card  - go to any library branch. They will set you up in 15 minutes. It is free. You can check out books to read and to listen, check out magazines and newspapers, movies etc.  Paul Mason and Randi Kreger "Stop walking on eggshells"  Martha Stout "Sociopath Next Door"  Albert Bernstein "Emotional Vampires"  Gordon Livingston "Too Soon Old, Too Late Smart" - not available in the library  

## 2017-12-20 NOTE — Assessment & Plan Note (Signed)
BP Readings from Last 3 Encounters:  12/20/17 128/80  09/15/17 (!) 150/78  09/14/17 132/78

## 2017-12-20 NOTE — Progress Notes (Signed)
Subjective:  Patient ID: Anthony Woods, male    DOB: 1955-11-03  Age: 62 y.o. MRN: [id number]  CC: No chief complaint on file.   HPI Anthony Woods presents for DM, HTN, CAD May get a insulin pump   Outpatient Medications Prior to Visit  Medication Sig Dispense Refill  . aspirin EC 81 MG tablet Take 1 tablet (81 mg total) by mouth daily. 90 tablet 3  . atenolol-chlorthalidone (TENORETIC) 100-25 MG tablet Take 1 tablet by mouth daily. 30 tablet 11  . clopidogrel (PLAVIX) 75 MG tablet TAKE 1 TABLET BY MOUTH DAILY WITH BREAKFAST. 30 tablet 11  . diphenhydrAMINE (BENADRYL) 25 mg capsule Take 25 mg by mouth as needed (bee stings).    . EPINEPHrine 0.3 mg/0.3 mL IJ SOAJ injection Inject 0.3 mg into the muscle once as needed (for bee stings).    Marland Kitchen escitalopram (LEXAPRO) 5 MG tablet TAKE 1 TABLET BY MOUTH DAILY. (Patient taking differently: TAKE 1 TABLET (5mg )  BY MOUTH DAILY.) 30 tablet 5  . Insulin Pen Needle (ULTICARE MICRO PEN NEEDLES) 32G X 4 MM MISC use with insulin pen to inject insulin 2x daily. 100 each PRN  . isosorbide mononitrate (IMDUR) 30 MG 24 hr tablet Take 1 tablet (30 mg total) by mouth daily. 90 tablet 1  . levothyroxine (SYNTHROID, LEVOTHROID) 175 MCG tablet TAKE 1 TABLET (175 MCG TOTAL) BY MOUTH DAILY. 90 tablet 1  . linagliptin (TRADJENTA) 5 MG TABS tablet Take 1 tablet (5 mg total) by mouth daily. 90 tablet 3  . losartan (COZAAR) 100 MG tablet TAKE 1 TABLET BY MOUTH DAILY. 30 tablet 11  . metFORMIN (GLUCOPHAGE) 1000 MG tablet Take 1 tablet (1,000 mg total) by mouth 2 (two) times daily with a meal. 180 tablet 3  . nitroGLYCERIN (NITROSTAT) 0.4 MG SL tablet Place 1 tablet (0.4 mg total) under the tongue every 5 (five) minutes as needed for chest pain. 25 tablet 6  . OZEMPIC, 0.25 OR 0.5 MG/DOSE, 2 MG/1.5ML SOPN   6  . potassium chloride (K-DUR) 10 MEQ tablet TAKE 1 TABLET BY MOUTH 2 TIMES DAILY. (Patient taking differently: TAKE 1 TABLET (10MEQ)  BY MOUTH 2 TIMES DAILY.) 60  tablet 11  . predniSONE (DELTASONE) 10 MG tablet Take 10 mg by mouth daily as needed (if stung by bee).    . rosuvastatin (CRESTOR) 20 MG tablet TAKE 1 TABLET BY MOUTH AT BEDTIME. 90 tablet 3  . XARELTO 20 MG TABS tablet TAKE 1 TABLET BY MOUTH DAILY. 30 tablet 11  . HUMALOG MIX 75/25 KWIKPEN (75-25) 100 UNIT/ML Kwikpen Inject 10-50 Units into the skin See admin instructions. Inject 50 units in the morning and 10 units at night    . losartan (COZAAR) 100 MG tablet TAKE 1 TABLET BY MOUTH DAILY. 90 tablet 1   No facility-administered medications prior to visit.     ROS: Review of Systems  Constitutional: Positive for fatigue. Negative for appetite change and unexpected weight change.  HENT: Negative for congestion, nosebleeds, sneezing, sore throat and trouble swallowing.   Eyes: Negative for itching and visual disturbance.  Respiratory: Negative for cough.   Cardiovascular: Negative for chest pain, palpitations and leg swelling.  Gastrointestinal: Negative for abdominal distention, blood in stool, diarrhea and nausea.  Genitourinary: Negative for frequency and hematuria.  Musculoskeletal: Positive for arthralgias and back pain. Negative for gait problem, joint swelling and neck pain.  Skin: Negative for rash.  Neurological: Negative for dizziness, tremors, speech difficulty and weakness.  Psychiatric/Behavioral: Negative for agitation, dysphoric mood, sleep disturbance and suicidal ideas. The patient is not nervous/anxious.   stressed  Objective:  BP 128/80 (BP Location: Left Arm, Patient Position: Sitting, Cuff Size: Large)   Pulse (!) 58   Temp 97.8 F (36.6 C) (Oral)   Ht 5\' 5"  (1.651 m)   Wt 250 lb (113.4 kg)   SpO2 97%   BMI 41.60 kg/m   BP Readings from Last 3 Encounters:  12/20/17 128/80  09/15/17 (!) 150/78  09/14/17 132/78    Wt Readings from Last 3 Encounters:  12/20/17 250 lb (113.4 kg)  09/15/17 256 lb (116.1 kg)  09/14/17 258 lb (117 kg)    Physical Exam    Constitutional: He is oriented to person, place, and time. He appears well-developed. No distress.  NAD  HENT:  Mouth/Throat: Oropharynx is clear and moist.  Eyes: Pupils are equal, round, and reactive to light. Conjunctivae are normal.  Neck: Normal range of motion. No JVD present. No thyromegaly present.  Cardiovascular: Normal rate, regular rhythm, normal heart sounds and intact distal pulses. Exam reveals no gallop and no friction rub.  No murmur heard. Pulmonary/Chest: Effort normal and breath sounds normal. No respiratory distress. He has no wheezes. He has no rales. He exhibits no tenderness.  Abdominal: Soft. Bowel sounds are normal. He exhibits no distension and no mass. There is no tenderness. There is no rebound and no guarding.  Musculoskeletal: Normal range of motion. He exhibits no edema or tenderness.  Lymphadenopathy:    He has no cervical adenopathy.  Neurological: He is alert and oriented to person, place, and time. He has normal reflexes. No cranial nerve deficit. He exhibits normal muscle tone. He displays a negative Romberg sign. Coordination and gait normal.  Skin: Skin is warm and dry. No rash noted.  Psychiatric: He has a normal mood and affect. His behavior is normal. Judgment and thought content normal.  obese  Lab Results  Component Value Date   WBC 11.1 (H) 08/29/2017   HGB 15.4 08/29/2017   HCT 45.5 08/29/2017   PLT 222 08/29/2017   GLUCOSE 211 (H) 09/14/2017   CHOL 125 09/14/2017   TRIG 187.0 (H) 09/14/2017   HDL 34.20 (L) 09/14/2017   LDLDIRECT 100.0 12/09/2014   LDLCALC 53 09/14/2017   ALT 21 09/14/2017   AST 19 09/14/2017   NA 138 09/14/2017   K 3.6 09/14/2017   CL 100 09/14/2017   CREATININE 0.94 09/14/2017   BUN 11 09/14/2017   CO2 29 09/14/2017   TSH 1.25 09/14/2017   PSA 0.41 09/14/2017   INR 1.12 05/12/2016   HGBA1C 10.8 (H) 09/14/2017   MICROALBUR 4.7 (H) 12/06/2013    No results found.  Assessment & Plan:   There are no  diagnoses linked to this encounter.   No orders of the defined types were placed in this encounter.    Follow-up: No follow-ups on file.  Walker Kehr, MD

## 2017-12-20 NOTE — Assessment & Plan Note (Signed)
Xarelto

## 2017-12-20 NOTE — Assessment & Plan Note (Signed)
Discussed On Lexapro

## 2017-12-23 ENCOUNTER — Other Ambulatory Visit: Payer: Self-pay | Admitting: Internal Medicine

## 2017-12-28 NOTE — Progress Notes (Signed)
Chief Complaint  Patient presents with  . Follow-up    CAD   History of Present Illness: 62 yo male with history of atrial fibrillation, CAD, HTN, DM, obesity and tobacco abuse who is here today for cardiac follow up. He was admitted to [facility] February 2018 with chest pain and was found to be in atrial fibrillation. 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 He was discharged on Plavix and Xarelto. Echo February 2018 with LVEF=50-55%. He was seen in the ED 08/18/17 with chest pain. He was standing and had onset of severe substernal chest pain, sharp at first and then heavy for an hour. Troponin negative in the ED. He was seen by Dr. Marlou Porch and was discharged home. He continued to have chest pain. Cardiac cath 08/31/17 with severe stenosis in the proximal Circumflex treated with a drug eluting stents. The other stent in the Circumflex was patent.Thre was mild disease in the LAD and RCA.  LVEF was normal by LV gram. Normal ABI July 2019.   He is here today for follow up. The patient denies any chest pain, dyspnea, palpitations, lower extremity edema, orthopnea, PND, dizziness, near syncope or syncope.    Primary Care Physician: Cassandria Anger, MD  Past Medical History:  Diagnosis Date  . Allergy to bee sting   . Arthritis    "right knee" (05/12/2016)  . CAD (coronary artery disease)   . Headache    "when his sugar goes too high or too low" (05/12/2016)  . HTN (hypertension)   . Hyperlipidemia   . Hypothyroidism   . Macular degeneration   . Obesity   . Type II diabetes mellitus ([place])     Past Surgical History:  Procedure Laterality Date  . CARDIAC CATHETERIZATION    . CORONARY ANGIOPLASTY WITH STENT PLACEMENT  05/12/2016  . CORONARY STENT INTERVENTION N/A 05/12/2016   Procedure: Coronary Stent Intervention;  Surgeon: Jettie Booze, MD;  Location: [place] [facility];  Service: Cardiovascular;  Laterality: N/A;  .  CORONARY STENT INTERVENTION N/A 08/31/2017   Procedure: CORONARY STENT INTERVENTION;  Surgeon: Jettie Booze, MD;  Location: [place] [facility];  Service: Cardiovascular;  Laterality: N/A;  . GANGLION CYST EXCISION Left 1980s  . KNEE ARTHROSCOPY Right    Meniscus removal  . LEFT HEART CATH AND CORONARY ANGIOGRAPHY N/A 05/12/2016   Procedure: Left Heart Cath and Coronary Angiography;  Surgeon: Jettie Booze, MD;  Location: [place] [facility];  Service: Cardiovascular;  Laterality: N/A;  . LEFT HEART CATH AND CORONARY ANGIOGRAPHY N/A 08/31/2017   Procedure: LEFT HEART CATH AND CORONARY ANGIOGRAPHY;  Surgeon: Jettie Booze, MD;  Location: [place] [facility];  Service: Cardiovascular;  Laterality: N/A;  . LEFT HEART CATHETERIZATION WITH CORONARY ANGIOGRAM N/A 02/04/2012   Procedure: LEFT HEART CATHETERIZATION WITH CORONARY ANGIOGRAM;  Surgeon: Burnell Blanks, MD;  Location: [facility] CATH LAB;  Service: Cardiovascular;  Laterality: N/A;    Current Outpatient Medications  Medication Sig Dispense Refill  . atenolol-chlorthalidone (TENORETIC) 100-25 MG tablet Take 1 tablet by mouth daily. 30 tablet 11  . clopidogrel (PLAVIX) 75 MG tablet Take 75 mg by mouth daily.    . diphenhydrAMINE (BENADRYL) 25 mg capsule Take 25 mg by mouth as needed (bee stings).    . EPINEPHrine 0.3 mg/0.3 mL IJ SOAJ injection Inject 0.3 mg into the muscle once as needed (for bee stings).    Marland Kitchen escitalopram (LEXAPRO) 10  MG tablet Take 1 tablet (10 mg total) by mouth daily. 90 tablet 3  . Insulin Pen Needle (ULTICARE MICRO PEN NEEDLES) 32G X 4 MM MISC use with insulin pen to inject insulin 2x daily. 100 each PRN  . isosorbide mononitrate (IMDUR) 30 MG 24 hr tablet Take 1 tablet (30 mg total) by mouth daily. 90 tablet 1  . levothyroxine (SYNTHROID, LEVOTHROID) 175 MCG tablet TAKE 1 TABLET (175 MCG TOTAL) BY MOUTH DAILY. 90 tablet 1  . linagliptin (TRADJENTA) 5 MG TABS tablet Take 1 tablet (5 mg total) by mouth  daily. 90 tablet 3  . losartan (COZAAR) 100 MG tablet TAKE 1 TABLET BY MOUTH DAILY. 30 tablet 11  . metFORMIN (GLUCOPHAGE) 1000 MG tablet Take 1 tablet (1,000 mg total) by mouth 2 (two) times daily with a meal. 180 tablet 3  . nitroGLYCERIN (NITROSTAT) 0.4 MG SL tablet Place 1 tablet (0.4 mg total) under the tongue every 5 (five) minutes as needed for chest pain. 25 tablet 6  . OZEMPIC, 0.25 OR 0.5 MG/DOSE, 2 MG/1.5ML SOPN   6  . potassium chloride (K-DUR) 10 MEQ tablet TAKE 1 TABLET BY MOUTH 2 TIMES DAILY. (Patient taking differently: TAKE 1 TABLET (10MEQ)  BY MOUTH 2 TIMES DAILY.) 60 tablet 11  . predniSONE (DELTASONE) 10 MG tablet Take 10 mg by mouth daily as needed (if stung by bee).    . rosuvastatin (CRESTOR) 20 MG tablet TAKE 1 TABLET BY MOUTH AT BEDTIME. 90 tablet 3  . XARELTO 20 MG TABS tablet TAKE 1 TABLET BY MOUTH DAILY. 30 tablet 11   No current facility-administered medications for this visit.     Allergies  Allergen Reactions  . Bee Venom Anaphylaxis  . Actos [Pioglitazone Hydrochloride] Swelling  . Lipitor [Atorvastatin] Other (See Comments)    Patient cannot recall reaction  . Lisinopril Other (See Comments)    cough    Social History   Socioeconomic History  . Marital status: Married    Spouse name: Not on file  . Number of children: 1  . Years of education: Not on file  . Highest education level: Not on file  Occupational History  . Occupation: [employer]  . Occupation: GRAVE DIGGER    Employer: [employer]  . Financial resource strain: Not on file  . Food insecurity:    Worry: Not on file    Inability: Not on file  . Transportation needs:    Medical: Not on file    Non-medical: Not on file  Tobacco Use  . Smoking status: Former Smoker    Packs/day: 1.50    Years: 42.00    Pack years: 63.00    Types: Cigarettes  . Smokeless tobacco: Never Used  Substance and Sexual Activity  . Alcohol use: No  . Drug use: No  . Sexual  activity: Not Currently  Lifestyle  . Physical activity:    Days per week: Not on file    Minutes per session: Not on file  . Stress: Not on file  Relationships  . Social connections:    Talks on phone: Not on file    Gets together: Not on file    Attends religious service: Not on file    Active member of club or organization: Not on file    Attends meetings of clubs or organizations: Not on file    Relationship status: Not on file  . Intimate partner violence:    Fear of current or  ex partner: Not on file    Emotionally abused: Not on file    Physically abused: Not on file    Forced sexual activity: Not on file  Other Topics Concern  . Not on file  Social History Narrative  . Not on file    Family History  Problem Relation Age of Onset  . Diabetes Father   . Cancer Father        stomach  . CVA Father   . Stomach cancer Father   . Diabetes Mother   . Cancer Mother        male ca  . Colon cancer Mother 42  . Hypertension Other   . Diabetes Other   . CAD Maternal Grandmother     Review of Systems:  As stated in the HPI and otherwise negative.   BP (!) 110/58   Pulse (!) 58   Ht 5\' 5"  (1.651 m)   Wt 255 lb (115.7 kg)   SpO2 95%   BMI 42.43 kg/m   Physical Examination:  General: Well developed, well nourished, NAD  HEENT: OP clear, mucus membranes moist  SKIN: warm, dry. No rashes. Neuro: No focal deficits  Musculoskeletal: Muscle strength 5/5 all ext  Psychiatric: Mood and affect normal  Neck: No JVD, no carotid bruits, no thyromegaly, no lymphadenopathy.  Lungs:Clear bilaterally, no wheezes, rhonci, crackles Cardiovascular:Regular rate and rhythm. No murmurs, gallops or rubs. Abdomen:Soft. Bowel sounds present. Non-tender.  Extremities: No lower extremity edema. Pulses are 2 + in the bilateral DP/PT.   EKG:  EKG is not ordered today. The ekg ordered today demonstrates   Recent Labs: 08/29/2017: Hemoglobin 15.4; Platelets 222 09/14/2017: ALT 21; BUN  11; Creatinine, Ser 0.94; Potassium 3.6; Sodium 138; TSH 1.25   Lipid Panel    Component Value Date/Time   CHOL 125 09/14/2017 0850   TRIG 187.0 (H) 09/14/2017 0850   TRIG 219 (HH) 02/08/2006 0757   HDL 34.20 (L) 09/14/2017 0850   CHOLHDL 4 09/14/2017 0850   VLDL 37.4 09/14/2017 0850   LDLCALC 53 09/14/2017 0850   LDLDIRECT 100.0 12/09/2014 0827     Wt Readings from Last 3 Encounters:  12/29/17 255 lb (115.7 kg)  12/20/17 250 lb (113.4 kg)  09/15/17 256 lb (116.1 kg)     Other studies Reviewed: Additional studies/ records that were reviewed today include: . Review of the above records demonstrates:   Assessment and Plan:   1. CAD without angina: No chest pain. Continue Plavix, statin and beta blocker. He is off of ASA since he is also on Xarelto.  2. Atrial fibrillation, paroxysmal: Sinus today. Continue beta blocker and Xarelto.      3. HTN: BP is controlled. No changes  4. Tobacco abuse: Smoking cessation encouraged.   Current medicines are reviewed at length with the patient today.  The patient does not have concerns regarding medicines.  The following changes have been made:  no change  Labs/ tests ordered today include:   No orders of the defined types were placed in this encounter.  Disposition:   FU with office APP on my care team in 6 months.    Signed, Lauree Chandler, MD 12/29/2017 8:30 AM    [address], [place], [place]  [address] Phone: [phone]; Fax: [phone]

## 2017-12-29 ENCOUNTER — Encounter: Payer: Self-pay | Admitting: Cardiovascular Disease

## 2017-12-29 ENCOUNTER — Ambulatory Visit: Payer: 59 | Admitting: Cardiovascular Disease

## 2017-12-29 VITALS — BP 110/58 | HR 58 | Ht 65.0 in | Wt 255.0 lb

## 2017-12-29 DIAGNOSIS — I2510 Atherosclerotic heart disease of native coronary artery without angina pectoris: Secondary | ICD-10-CM

## 2017-12-29 DIAGNOSIS — I480 Paroxysmal atrial fibrillation: Secondary | ICD-10-CM | POA: Diagnosis not present

## 2017-12-29 DIAGNOSIS — Z720 Tobacco use: Secondary | ICD-10-CM | POA: Diagnosis not present

## 2017-12-29 DIAGNOSIS — I10 Essential (primary) hypertension: Secondary | ICD-10-CM | POA: Diagnosis not present

## 2017-12-29 NOTE — Patient Instructions (Signed)

## 2018-01-06 ENCOUNTER — Other Ambulatory Visit: Payer: Self-pay | Admitting: Internal Medicine

## 2018-01-11 ENCOUNTER — Encounter: Payer: Self-pay | Admitting: Gastroenterology

## 2018-01-11 ENCOUNTER — Other Ambulatory Visit: Payer: Self-pay | Admitting: Internal Medicine

## 2018-01-11 ENCOUNTER — Ambulatory Visit: Payer: 59 | Admitting: Gastroenterology

## 2018-01-11 VITALS — BP 132/78 | HR 72 | Ht 65.0 in | Wt 252.5 lb

## 2018-01-11 DIAGNOSIS — Z800 Family history of malignant neoplasm of digestive organs: Secondary | ICD-10-CM | POA: Diagnosis not present

## 2018-01-11 DIAGNOSIS — K625 Hemorrhage of anus and rectum: Secondary | ICD-10-CM | POA: Diagnosis not present

## 2018-01-11 DIAGNOSIS — Z860100 Personal history of colon polyps, unspecified: Secondary | ICD-10-CM

## 2018-01-11 DIAGNOSIS — Z7901 Long term (current) use of anticoagulants: Secondary | ICD-10-CM | POA: Diagnosis not present

## 2018-01-11 NOTE — Progress Notes (Signed)
HPI :  62 y/o male with a history of atrial fibrillation, CAD, tobacco use, referred here by Plotnikov, Evie Lacks, MD for history of rectal bleeding and surveillance colonoscopy.   He has a significant cardiac history. He's had a drug eluting stent placed in the circumflex artery in 2018, and most recently had a chest pain which led to cardiac cath in June which showed a severe stenosis of the proximal circumflex which was treated with another drug eluting stent. He had a normal EF during cardiac cath. He has been taking Plavix for the stents and Xarelto for the atrial fibrillation. He denies any new cardiopulmonary symptoms today, he has some baseline mild dyspnea with exertion. He is a long time smoker and continues to smoke cigarettes.  His mother had colon cancer diagnosed in her 12s. He had a colonoscopy done in 2014 at which time he had a 1cm tubular adenoma removed. He states he's had some intermittent rare rectal bleeding since his first stent was placed in 2018. He denies any constipation or straining with his stools, his bowels are pretty regular. He has some occasional sporadic RLQ pain but otherwise generally denies any other complaints. Pain not relieved with a bowel movement. He is not sure if he has hemorrhoids, denies perianal irritation.  He denies any history of reflux symptoms or any dysphagia. He denies NSAID use, uses tylenol PRN if he has any aches / pains.   Colonoscopy 10/16/2012 - 1cm sigmoid adenoma  Past Medical History:  Diagnosis Date  . Allergy to bee sting   . Arthritis    "right knee" (05/12/2016)  . CAD (coronary artery disease)   . Headache    "when his sugar goes too high or too low" (05/12/2016)  . HTN (hypertension)   . Hyperlipidemia   . Hypothyroidism   . Macular degeneration   . Obesity   . Type II diabetes mellitus ([place])      Past Surgical History:  Procedure Laterality Date  . CARDIAC CATHETERIZATION    . CORONARY ANGIOPLASTY WITH STENT  PLACEMENT  05/12/2016  . CORONARY STENT INTERVENTION N/A 05/12/2016   Procedure: Coronary Stent Intervention;  Surgeon: Jettie Booze, MD;  Location: [place] [facility];  Service: Cardiovascular;  Laterality: N/A;  . CORONARY STENT INTERVENTION N/A 08/31/2017   Procedure: CORONARY STENT INTERVENTION;  Surgeon: Jettie Booze, MD;  Location: [place] [facility];  Service: Cardiovascular;  Laterality: N/A;  . GANGLION CYST EXCISION Left 1980s  . KNEE ARTHROSCOPY Right    Meniscus removal  . LEFT HEART CATH AND CORONARY ANGIOGRAPHY N/A 05/12/2016   Procedure: Left Heart Cath and Coronary Angiography;  Surgeon: Jettie Booze, MD;  Location: [place] [facility];  Service: Cardiovascular;  Laterality: N/A;  . LEFT HEART CATH AND CORONARY ANGIOGRAPHY N/A 08/31/2017   Procedure: LEFT HEART CATH AND CORONARY ANGIOGRAPHY;  Surgeon: Jettie Booze, MD;  Location: [place] [facility];  Service: Cardiovascular;  Laterality: N/A;  . LEFT HEART CATHETERIZATION WITH CORONARY ANGIOGRAM N/A 02/04/2012   Procedure: LEFT HEART CATHETERIZATION WITH CORONARY ANGIOGRAM;  Surgeon: Burnell Blanks, MD;  Location: [facility] CATH LAB;  Service: Cardiovascular;  Laterality: N/A;   Family History  Problem Relation Age of Onset  . Diabetes Father   . Cancer Father        stomach  . CVA Father   . Stomach cancer Father   . Diabetes Mother   . Cancer Mother  male ca  . Colon cancer Mother 78  . Hypertension Other   . Diabetes Other   . CAD Maternal Grandmother    Social History   Tobacco Use  . Smoking status: Former Smoker    Packs/day: 1.50    Years: 42.00    Pack years: 63.00    Types: Cigarettes  . Smokeless tobacco: Never Used  Substance Use Topics  . Alcohol use: No  . Drug use: No   Current Outpatient Medications  Medication Sig Dispense Refill  . atenolol-chlorthalidone (TENORETIC) 100-25 MG tablet TAKE 1 TABLET BY MOUTH DAILY. 30 tablet 5  . clopidogrel (PLAVIX) 75 MG  tablet Take 75 mg by mouth daily.    . diphenhydrAMINE (BENADRYL) 25 mg capsule Take 25 mg by mouth as needed (bee stings).    . EPINEPHrine 0.3 mg/0.3 mL IJ SOAJ injection Inject 0.3 mg into the muscle once as needed (for bee stings).    Marland Kitchen escitalopram (LEXAPRO) 10 MG tablet Take 1 tablet (10 mg total) by mouth daily. 90 tablet 3  . insulin lispro protamine-lispro (HUMALOG 75/25 MIX) (75-25) 100 UNIT/ML SUSP injection Inject 60 Units into the skin daily.    . Insulin Pen Needle (ULTICARE MICRO PEN NEEDLES) 32G X 4 MM MISC use with insulin pen to inject insulin 2x daily. 100 each PRN  . isosorbide mononitrate (IMDUR) 30 MG 24 hr tablet Take 1 tablet (30 mg total) by mouth daily. 90 tablet 1  . levothyroxine (SYNTHROID, LEVOTHROID) 175 MCG tablet TAKE 1 TABLET (175 MCG TOTAL) BY MOUTH DAILY. 90 tablet 1  . linagliptin (TRADJENTA) 5 MG TABS tablet Take 1 tablet (5 mg total) by mouth daily. 90 tablet 3  . losartan (COZAAR) 100 MG tablet TAKE 1 TABLET BY MOUTH DAILY. 30 tablet 11  . metFORMIN (GLUCOPHAGE) 1000 MG tablet Take 1 tablet (1,000 mg total) by mouth 2 (two) times daily with a meal. 180 tablet 3  . nitroGLYCERIN (NITROSTAT) 0.4 MG SL tablet Place 1 tablet (0.4 mg total) under the tongue every 5 (five) minutes as needed for chest pain. 25 tablet 6  . OZEMPIC, 0.25 OR 0.5 MG/DOSE, 2 MG/1.5ML SOPN   6  . potassium chloride (K-DUR) 10 MEQ tablet TAKE 1 TABLET BY MOUTH 2 TIMES DAILY. (Patient taking differently: TAKE 1 TABLET (10MEQ)  BY MOUTH 2 TIMES DAILY.) 60 tablet 11  . predniSONE (DELTASONE) 10 MG tablet Take 10 mg by mouth daily as needed (if stung by bee).    . rosuvastatin (CRESTOR) 20 MG tablet TAKE 1 TABLET BY MOUTH AT BEDTIME. 90 tablet 3  . XARELTO 20 MG TABS tablet TAKE 1 TABLET BY MOUTH DAILY. 30 tablet 11   No current facility-administered medications for this visit.    Allergies  Allergen Reactions  . Bee Venom Anaphylaxis  . Actos [Pioglitazone Hydrochloride] Swelling  .  Lipitor [Atorvastatin] Other (See Comments)    Patient cannot recall reaction  . Lisinopril Other (See Comments)    cough     Review of Systems: All systems reviewed and negative except where noted in HPI.   Lab Results  Component Value Date   WBC 11.1 (H) 08/29/2017   HGB 15.4 08/29/2017   HCT 45.5 08/29/2017   MCV 93 08/29/2017   PLT 222 08/29/2017    Lab Results  Component Value Date   CREATININE 0.94 09/14/2017   BUN 11 09/14/2017   NA 138 09/14/2017   K 3.6 09/14/2017   CL 100 09/14/2017   CO2  29 09/14/2017    Lab Results  Component Value Date   ALT 21 09/14/2017   AST 19 09/14/2017   ALKPHOS 56 09/14/2017   BILITOT 0.4 09/14/2017     Physical Exam: BP 132/78   Pulse 72   Ht 5\' 5"  (1.651 m)   Wt 252 lb 8 oz (114.5 kg)   BMI 42.02 kg/m  Constitutional: Pleasant, male in no acute distress. HEENT: Normocephalic and atraumatic. Conjunctivae are normal. No scleral icterus. Neck supple.  Cardiovascular: Normal rate, regular rhythm.  Pulmonary/chest: Effort normal and breath sounds normal. No wheezing, rales or rhonchi. Abdominal: Soft, protuberant, nontender. There are no masses palpable. No hepatomegaly. DRE - normal external exam, no mass lesions, internal hemorrhoids Extremities: no edema Lymphadenopathy: No cervical adenopathy noted. Neurological: Alert and oriented to person place and time. Skin: Skin is warm and dry. No rashes noted. Psychiatric: Normal mood and affect. Behavior is normal.   ASSESSMENT AND PLAN: 62 y/o male with history as outlined above, here for a new patient visit to discuss the following issues:  History of colon adenomas / family history of colon cancer / rectal bleeding / anticoagulated - he has had scant intermittent bleeding for over a year, no anemia, DRE shows hemorrhoids which I suspect is the likely etiology, although given his history of polyps and family history of colon cancer he warrants a colonoscopy. We discussed  what a colonoscopy is and what it would entail. I'm concerned about his ongoing anticoagulation and recent coronary stenting. Cardiology may wish to keep him on interrupted Plavix for several months post DES placement, I will need to discuss his case with his cardiologist to determine if and when he would be able to hold the Plavix and Xarelto for a procedure. Patient and his wife verbalized understanding and agreed. I will reach out to Dr. Angelena Form and get back to him when I hear back. Once cleared from cardiology he is hoping to proceed with colonoscopy.  Sparks Cellar, MD [facility]  CC: Plotnikov, Evie Lacks, MD

## 2018-01-11 NOTE — Patient Instructions (Addendum)
If you are age 61 or older, your body mass index should be between 23-30. Your Body mass index is 42.02 kg/m. If this is out of the aforementioned range listed, please consider follow up with your Primary Care Provider.  If you are age 25 or younger, your body mass index should be between 19-25. Your Body mass index is 42.02 kg/m. If this is out of the aformentioned range listed, please consider follow up with your Primary Care Provider.    Thank you for entrusting me with your care and for choosing [facility], Dr.  Cellar

## 2018-01-12 ENCOUNTER — Telehealth: Payer: Self-pay

## 2018-01-12 NOTE — Telephone Encounter (Signed)
Called and spoke to pt. Offered him mid December appt (03/06/18).  He declined and would like to wait until after the first of the year to schedule.

## 2018-01-12 NOTE — Telephone Encounter (Signed)
-----   Message from Yetta Flock, MD sent at 01/11/2018  4:15 PM EDT ----- Jan, FYI, can you please contact this patient and let him know that Dr. Angelena Form thinks it would be okay to hold the Plavix and Xarelto when he is 6 months out from his stenting, which would be mid December.   He can schedule his colonoscopy for that time or January, whichever his preference, if you can help schedule him. Thanks much  ----- Message ----- From: Burnell Blanks, MD Sent: 01/11/2018   2:58 PM EDT To: Yetta Flock, MD Subject: RE: mutual patient                             Richardson Landry, I think it would be best to complete 6 months of antiplatelet therapy before stopping his Plavix. Mid December should be ok to hold the Plavix. His Xarelto can be held then too as he is on it for stroke prevention with atrial fibrillation.   Thanks,  Gerald Stabs  ----- Message ----- From: Yetta Flock, MD Sent: 01/11/2018   2:07 PM EDT To: Burnell Blanks, MD Subject: mutual patient                                 Hi,  I wanted to touch base about this mutual patient. I know he had a stent placed in June and on Plavix for that and Xarelto for atrial fibrillation.   He saw me today about intermittent low grade rectal bleeding (I think due to hemorrhoids) but he has a strong family history of colon cancer and a personal history of polyps, hoping to do a colonoscopy at some point in time for this.  I was curious when you think he may be okay to hold his anticoagulation, specifically the Plavix, after having the stent in June. I understand you may wish to continue this uninterrupted for several months which is okay, I don' think this is urgent.   Thanks for your input, Richardson Landry

## 2018-02-21 ENCOUNTER — Telehealth: Payer: Self-pay

## 2018-02-21 NOTE — Telephone Encounter (Signed)
-----   Message from Roetta Sessions, [place] sent at 01/12/2018  9:02 AM EDT ----- See note from 01-12-18. Pt would like to schedule colon after the first of the year with Dr. Havery Moros.  Call to schedule colon and previsit. Send anti coag letter to Dr. Angelena Form for Plavix and Xarelto.   ----- Message ----- From: Yetta Flock, MD Sent: 01/11/2018   4:15 PM EDT To: Roetta Sessions, CMA  Jan, FYI, can you please contact this patient and let him know that Dr. Angelena Form thinks it would be okay to hold the Plavix and Xarelto when he is 6 months out from his stenting, which would be mid December.   He can schedule his colonoscopy for that time or January, whichever his preference, if you can help schedule him. Thanks much  ----- Message ----- From: Burnell Blanks, MD Sent: 01/11/2018   2:58 PM EDT To: Yetta Flock, MD Subject: RE: mutual patient                             Richardson Landry, I think it would be best to complete 6 months of antiplatelet therapy before stopping his Plavix. Mid December should be ok to hold the Plavix. His Xarelto can be held then too as he is on it for stroke prevention with atrial fibrillation.   Thanks,  Gerald Stabs  ----- Message ----- From: Yetta Flock, MD Sent: 01/11/2018   2:07 PM EDT To: Burnell Blanks, MD Subject: mutual patient                                 Hi,  I wanted to touch base about this mutual patient. I know he had a stent placed in June and on Plavix for that and Xarelto for atrial fibrillation.   He saw me today about intermittent low grade rectal bleeding (I think due to hemorrhoids) but he has a strong family history of colon cancer and a personal history of polyps, hoping to do a colonoscopy at some point in time for this.  I was curious when you think he may be okay to hold his anticoagulation, specifically the Plavix, after having the stent in June. I understand you may wish to continue this uninterrupted for  several months which is okay, I don' think this is urgent.   Thanks for your input, Richardson Landry

## 2018-02-21 NOTE — Telephone Encounter (Signed)
Called and spoke to wife: scheduled pt for colon on Friday 03-31-2017 and previsit 03-13-18.   Per note from Dr. Angelena Form ok to hold Plavix and Xarelto for procedure.

## 2018-02-21 NOTE — Telephone Encounter (Signed)
[facility] Pre-operative Risk Assessment     Request for surgical clearance:     Endoscopy Procedure  What type of surgery is being performed?     colonoscopy  When is this surgery scheduled?     03-31-2018  What type of clearance is required ?   Pharmacy  Are there any medications that need to be held prior to surgery and how long? Plavix 5 days and Xarelto 2 days  Practice name and name of physician performing surgery?   [facility]  What is your office phone and fax number?      Phone- [phone]  Fax- [phone] contact Lemar Lofty  Anesthesia type (None, local, MAC, general) ?       MAC

## 2018-02-22 NOTE — Telephone Encounter (Signed)
With his GI history including polyps and recent GI bleeding, it sounds like his colonoscopy needs to be performed soon. I would feel comfortable holding his Plavix 6 months after his stent placement. The newest generation drug eluting stents have a very low incidence of stent thrombosis. I think he can also hold his Xarelto. I would hold the Plavix 5 days before he procedure and Xarelto 2 days before his procedure. [place]

## 2018-02-22 NOTE — Telephone Encounter (Signed)
   Primary Cardiologist:Christopher Angelena Form, MD  Chart reviewed as part of pre-operative protocol coverage. Pt just had PCI in June of this year. He will need clearance from Dr. Angelena Form to hold Plavix as he has not completed a full year of antiplatelet therapy. Will route to MD for recommendations. Clearance pending. TBD by primary cardiologist.   If applicable, this message will also be routed to pharmacy pool and/or primary cardiologist for input on holding anticoagulant/antiplatelet agent as requested below so that this information is available at time of patient's appointment.   Lyda Jester, PA-C  02/22/2018, 2:26 PM

## 2018-02-23 NOTE — Telephone Encounter (Signed)
Called and spoke to [place], pt's wife.  Relayed that pt needs to hold Plavix after 1-4 and Xarelto after 1-7. She wrote it down and expressed understanding.

## 2018-02-23 NOTE — Telephone Encounter (Signed)
   Primary Cardiologist: Lauree Chandler, MD  Chart reviewed as part of pre-operative protocol coverage. Antiplatelet/anticoag question has already been addressed, just need to reach out to patient to make sure he has not had any new cardiac complaints that would need to be addressed beforehand. I called house phone but wife answered. Given need to speak directly to patient to review symptoms, I LMOM on his cell for him to call back. If no change in sx, anticipate clearance to proceed as outlined below.  Charlie Pitter, PA-C 02/23/2018, 3:58 PM

## 2018-02-24 ENCOUNTER — Encounter

## 2018-02-24 NOTE — Telephone Encounter (Signed)
   Primary Cardiologist: Lauree Chandler, MD  Chart reviewed as part of pre-operative protocol coverage. Patient was contacted 02/24/2018 in reference to pre-operative risk assessment for pending surgery as outlined below.  Karina A Fischl was last seen on 12/29/17 by Dr. Angelena Form.  Since that day, Anthony Woods has done well with no new cardiac symptoms. He is not very active due to knee pain and he also continues to smoke. He should be OK for this low risk procedure.   Per Dr. Angelena Form, With his GI history including polyps and recent GI bleeding, it sounds like his colonoscopy needs to be performed soon. I would feel comfortable holding his Plavix 6 months after his stent placement. The newest generation drug eluting stents have a very low incidence of stent thrombosis. I think he can also hold his Xarelto. I would hold the Plavix 5 days before he procedure and Xarelto 2 days before his procedure.  Therefore he could have his procedure after 03/02/18.   Therefore, based on ACC/AHA guidelines, the patient would be at acceptable risk for the planned procedure without further cardiovascular testing.   I will route this recommendation to the requesting party via Epic fax function and remove from pre-op pool.  Please call with questions.  Daune Perch, NP 02/24/2018, 10:02 AM

## 2018-03-13 ENCOUNTER — Ambulatory Visit (AMBULATORY_SURGERY_CENTER): Payer: Self-pay

## 2018-03-13 ENCOUNTER — Other Ambulatory Visit: Payer: Self-pay

## 2018-03-13 VITALS — Ht 65.0 in | Wt 253.8 lb

## 2018-03-13 DIAGNOSIS — Z86010 Personal history of colonic polyps: Secondary | ICD-10-CM

## 2018-03-13 MED ORDER — NA SULFATE-K SULFATE-MG SULF 17.5-3.13-1.6 GM/177ML PO SOLN: 1.0000 | Freq: Once | ORAL | 0 refills | Status: AC

## 2018-03-13 NOTE — Progress Notes (Signed)
No egg or soy allergy known to patient  No issues with past sedation with any surgeries  or procedures, no intubation problems  No diet pills per patient No home 02 use per patient   blood thinners taking xarelto and plavix, have insructions to hold from md Pt denies issues with constipation  Hx of  A fib   EMMI video sent to pt's e mail , pt declined

## 2018-03-17 ENCOUNTER — Encounter: Payer: Self-pay | Admitting: Gastroenterology

## 2018-03-24 ENCOUNTER — Other Ambulatory Visit: Payer: Self-pay | Admitting: Cardiology

## 2018-03-31 ENCOUNTER — Encounter: Payer: Self-pay | Admitting: Gastroenterology

## 2018-03-31 ENCOUNTER — Ambulatory Visit (AMBULATORY_SURGERY_CENTER): Payer: 59 | Admitting: Gastroenterology

## 2018-03-31 VITALS — BP 105/54 | HR 63 | Temp 99.3°F | Resp 14 | Wt 253.0 lb

## 2018-03-31 DIAGNOSIS — D120 Benign neoplasm of cecum: Secondary | ICD-10-CM | POA: Diagnosis not present

## 2018-03-31 DIAGNOSIS — D123 Benign neoplasm of transverse colon: Secondary | ICD-10-CM | POA: Diagnosis not present

## 2018-03-31 DIAGNOSIS — D125 Benign neoplasm of sigmoid colon: Secondary | ICD-10-CM | POA: Diagnosis not present

## 2018-03-31 DIAGNOSIS — D122 Benign neoplasm of ascending colon: Secondary | ICD-10-CM

## 2018-03-31 DIAGNOSIS — I2510 Atherosclerotic heart disease of native coronary artery without angina pectoris: Secondary | ICD-10-CM | POA: Diagnosis not present

## 2018-03-31 DIAGNOSIS — D127 Benign neoplasm of rectosigmoid junction: Secondary | ICD-10-CM | POA: Diagnosis not present

## 2018-03-31 DIAGNOSIS — I10 Essential (primary) hypertension: Secondary | ICD-10-CM | POA: Diagnosis not present

## 2018-03-31 DIAGNOSIS — Z1211 Encounter for screening for malignant neoplasm of colon: Secondary | ICD-10-CM | POA: Diagnosis not present

## 2018-03-31 DIAGNOSIS — Z86010 Personal history of colonic polyps: Secondary | ICD-10-CM | POA: Diagnosis not present

## 2018-03-31 MED ORDER — SODIUM CHLORIDE 0.9 % IV SOLN: 500.0000 mL | Freq: Once | INTRAVENOUS | Status: DC

## 2018-03-31 NOTE — Op Note (Signed)
[facility] Patient Name: Anthony Woods Procedure Date: 03/31/2018 7:52 AM MRN: [id number] Endoscopist: Remo Lipps P. Havery Moros , MD Age: 63 Referring MD:  Date of Birth: [birth year] Gender: Male Account #: [id number] Procedure:                Colonoscopy Indications:              High risk colon cancer surveillance: Personal                            history of colonic polyps, family history of colon                            cancer Medicines:                Monitored Anesthesia Care Procedure:                Pre-Anesthesia Assessment:                           - Prior to the procedure, a History and Physical                            was performed, and patient medications and                            allergies were reviewed. The patient's tolerance of                            previous anesthesia was also reviewed. The risks                            and benefits of the procedure and the sedation                            options and risks were discussed with the patient.                            All questions were answered, and informed consent                            was obtained. Prior Anticoagulants: The patient has                            taken Xarelto (rivaroxaban), last dose was 2 days                            prior to procedure, last dose of Plavix 5 days                            before the procedure. ASA Grade Assessment: III - A                            patient with severe systemic disease. After  reviewing the risks and benefits, the patient was                            deemed in satisfactory condition to undergo the                            procedure.                           After obtaining informed consent, the colonoscope                            was passed under direct vision. Throughout the                            procedure, the patient's blood pressure, pulse, and                            oxygen  saturations were monitored continuously. The                            Colonoscope was introduced through the anus and                            advanced to the the cecum, identified by                            appendiceal orifice and ileocecal valve. The                            colonoscopy was performed without difficulty. The                            patient tolerated the procedure well. The quality                            of the bowel preparation was good. The ileocecal                            valve, appendiceal orifice, and rectum were                            photographed. Scope In: 1:61:09 AM Scope Out: 8:32:57 AM Scope Withdrawal Time: 0 hours 22 minutes 59 seconds  Total Procedure Duration: 0 hours 26 minutes 43 seconds  Findings:                 The perianal and digital rectal examinations were                            normal.                           Two sessile polyps were found in the cecum. The  polyps were 2 to 5 mm in size. These polyps were                            removed with a cold snare. Resection and retrieval                            were complete.                           Two sessile polyps were found in the ascending                            colon. The polyps were 3 to 7 mm in size. These                            polyps were removed with a cold snare. Resection                            and retrieval were complete.                           A 4 mm polyp was found in the transverse colon. The                            polyp was sessile. The polyp was removed with a                            cold snare. Resection and retrieval were complete.                           A 3 mm polyp was found in the sigmoid colon. The                            polyp was sessile. The polyp was removed with a                            cold snare. Resection and retrieval were complete.                           A 3 mm polyp was  found in the recto-sigmoid colon.                            The polyp was sessile. The polyp was removed with a                            cold snare. Resection and retrieval were complete.                           Multiple small-mouthed diverticula were found in                            the sigmoid colon.  Internal hemorrhoids were found during retroflexion.                           The exam was otherwise without abnormality. Complications:            No immediate complications. Estimated blood loss:                            Minimal. Estimated Blood Loss:     Estimated blood loss was minimal. Impression:               - Two 2 to 5 mm polyps in the cecum, removed with a                            cold snare. Resected and retrieved.                           - Two 3 to 7 mm polyps in the ascending colon,                            removed with a cold snare. Resected and retrieved.                           - One 4 mm polyp in the transverse colon, removed                            with a cold snare. Resected and retrieved.                           - One 3 mm polyp in the sigmoid colon, removed with                            a cold snare. Resected and retrieved.                           - One 3 mm polyp at the recto-sigmoid colon,                            removed with a cold snare. Resected and retrieved.                           - Diverticulosis in the sigmoid colon.                           - Internal hemorrhoids.                           - The examination was otherwise normal. Recommendation:           - Patient has a contact number available for                            emergencies. The signs and symptoms of potential  delayed complications were discussed with the                            patient. Return to normal activities tomorrow.                            Written discharge instructions were provided to the                             patient.                           - Resume previous diet.                           - Continue present medications.                           - Resume Plavix on 1/11 (tomorrow)                           - Resume Xarelto on 1/12                           - Await pathology results. Remo Lipps P. Joanne Salah, MD 03/31/2018 8:39:28 AM This report has been signed electronically.

## 2018-03-31 NOTE — Patient Instructions (Signed)
YOU HAD AN ENDOSCOPIC PROCEDURE TODAY AT [facility]:   Refer to the procedure report that was given to you for any specific questions about what was found during the examination.  If the procedure report does not answer your questions, please call your gastroenterologist to clarify.  If you requested that your care partner not be given the details of your procedure findings, then the procedure report has been included in a sealed envelope for you to review at your convenience later.  YOU SHOULD EXPECT: Some feelings of bloating in the abdomen. Passage of more gas than usual.  Walking can help get rid of the air that was put into your GI tract during the procedure and reduce the bloating. If you had a lower endoscopy (such as a colonoscopy or flexible sigmoidoscopy) you may notice spotting of blood in your stool or on the toilet paper. If you underwent a bowel prep for your procedure, you may not have a normal bowel movement for a few days.  Please Note:  You might notice some irritation and congestion in your nose or some drainage.  This is from the oxygen used during your procedure.  There is no need for concern and it should clear up in a day or so.  SYMPTOMS TO REPORT IMMEDIATELY:   Following lower endoscopy (colonoscopy or flexible sigmoidoscopy):  Excessive amounts of blood in the stool  Significant tenderness or worsening of abdominal pains  Swelling of the abdomen that is new, acute  Fever of 100F or higher   For urgent or emergent issues, a gastroenterologist can be reached at any hour by calling [phone].   DIET:  We do recommend a small meal at first, but then you may proceed to your regular diet.  Drink plenty of fluids but you should avoid alcoholic beverages for 24 hours.  ACTIVITY:  You should plan to take it easy for the rest of today and you should NOT DRIVE or use heavy machinery until tomorrow (because of the sedation medicines used during the test).     FOLLOW UP: Our staff will call the number listed on your records the next business day following your procedure to check on you and address any questions or concerns that you may have regarding the information given to you following your procedure. If we do not reach you, we will leave a message.  However, if you are feeling well and you are not experiencing any problems, there is no need to return our call.  We will assume that you have returned to your regular daily activities without incident.  If any biopsies were taken you will be contacted by phone or by letter within the next 1-3 weeks.  Please call us at [phone] if you have not heard about the biopsies in 3 weeks.    SIGNATURES/CONFIDENTIALITY: You and/or your care partner have signed paperwork which will be entered into your electronic medical record.  These signatures attest to the fact that that the information above on your After Visit Summary has been reviewed and is understood.  Full responsibility of the confidentiality of this discharge information lies with you and/or your care-partner.   Please see handouts on polyps and hemorrhoids.   Thank you for allowing [place] to provide your healthcare today.

## 2018-03-31 NOTE — Progress Notes (Signed)
PT taken to PACU. Monitors in place. VSS. Report given to RN. 

## 2018-03-31 NOTE — Progress Notes (Signed)
Pt's states no medical or surgical changes since previsit or office visit. 

## 2018-04-03 ENCOUNTER — Telehealth: Payer: Self-pay

## 2018-04-03 NOTE — Telephone Encounter (Signed)
  Follow up Call-  Call back number 03/31/2018 03/31/2018  Post procedure Call Back phone  # [phone]- Jackelyn Poling (wife's #) [phone]  Permission to leave phone message - Yes  Some recent data might be hidden     Patient questions:  Do you have a fever, pain , or abdominal swelling? No. Pain Score  0 *  Have you tolerated food without any problems? Yes.    Have you been able to return to your normal activities? Yes.    Do you have any questions about your discharge instructions: Diet   No. Medications  No. Follow up visit  No.  Do you have questions or concerns about your Care? No.  Actions: * If pain score is 4 or above: No action needed, pain <4.

## 2018-04-12 DIAGNOSIS — I10 Essential (primary) hypertension: Secondary | ICD-10-CM | POA: Diagnosis not present

## 2018-04-12 DIAGNOSIS — E1165 Type 2 diabetes mellitus with hyperglycemia: Secondary | ICD-10-CM | POA: Diagnosis not present

## 2018-04-12 DIAGNOSIS — E039 Hypothyroidism, unspecified: Secondary | ICD-10-CM | POA: Diagnosis not present

## 2018-04-12 DIAGNOSIS — E7800 Pure hypercholesterolemia, unspecified: Secondary | ICD-10-CM | POA: Diagnosis not present

## 2018-04-14 ENCOUNTER — Other Ambulatory Visit: Payer: Self-pay | Admitting: Internal Medicine

## 2018-04-20 ENCOUNTER — Other Ambulatory Visit: Payer: Self-pay | Admitting: Endocrinology

## 2018-04-21 ENCOUNTER — Ambulatory Visit: Payer: 59

## 2018-04-21 ENCOUNTER — Ambulatory Visit
Admission: EM | Admit: 2018-04-21 | Discharge: 2018-04-21 | Disposition: A | Discharge status: Home / Self Care | Payer: 59 | Attending: Family Medicine | Admitting: Family Medicine

## 2018-04-21 DIAGNOSIS — M25532 Pain in left wrist: Secondary | ICD-10-CM | POA: Diagnosis not present

## 2018-04-21 DIAGNOSIS — M654 Radial styloid tenosynovitis [de Quervain]: Secondary | ICD-10-CM

## 2018-04-21 DIAGNOSIS — M19032 Primary osteoarthritis, left wrist: Secondary | ICD-10-CM | POA: Diagnosis not present

## 2018-04-21 MED ORDER — ACETAMINOPHEN 500 MG PO TABS: 500.0000 mg | ORAL_TABLET | Freq: Four times a day (QID) | ORAL | 0 refills | Status: DC | PRN

## 2018-04-21 NOTE — ED Triage Notes (Signed)
Pt c/o lt wrist pain since yesterday. No injury noted. Swelling to lt hand

## 2018-04-21 NOTE — ED Provider Notes (Signed)
[facility]   [id number] 04/21/18 Arrival Time: 1275  CC: Left wrist pain  SUBJECTIVE: History from: patient. Anthony Woods is a 63 y.o. male complains of left wrist pain that began yesterday.  Symptoms began after shoveling in his normal capacity for work.  Also mentions a fall a few weeks ago, but unsure if he injured his wrist at that time.  Pain diffuse about wrist.  Describes the pain as intermittent and worse with wrist ROM.  Has tried aspirin without relief.  Denies similar symptoms in the past. Complains of associated swelling.  Denies fever, chills, erythema, ecchymosis, weakness, numbness and tingling.      ROS: As per HPI.  Past Medical History:  Diagnosis Date  . Allergy to bee sting   . Anxiety   . Arthritis    "right knee" (05/12/2016)  . Atrial fibrillation [facility])    has 2 stents feb 2018 and June 2019  . CAD (coronary artery disease)   . Headache    "when his sugar goes too high or too low" (05/12/2016)  . HTN (hypertension)   . Hyperlipidemia   . Hypothyroidism   . Macular degeneration   . Obesity   . Type II diabetes mellitus ([place])    Past Surgical History:  Procedure Laterality Date  . CARDIAC CATHETERIZATION    . COLONOSCOPY    . CORONARY ANGIOPLASTY WITH STENT PLACEMENT  05/12/2016  . CORONARY STENT INTERVENTION N/A 05/12/2016   Procedure: Coronary Stent Intervention;  Surgeon: Jettie Booze, MD;  Location: [facility];  Service: Cardiovascular;  Laterality: N/A;  . CORONARY STENT INTERVENTION N/A 08/31/2017   Procedure: CORONARY STENT INTERVENTION;  Surgeon: Jettie Booze, MD;  Location: [facility];  Service: Cardiovascular;  Laterality: N/A;  . GANGLION CYST EXCISION Left 1980s  . KNEE ARTHROSCOPY Right    Meniscus removal  . LEFT HEART CATH AND CORONARY ANGIOGRAPHY N/A 05/12/2016   Procedure: Left Heart Cath and Coronary Angiography;  Surgeon: Jettie Booze, MD;  Location: [facility];  Service:  Cardiovascular;  Laterality: N/A;  . LEFT HEART CATH AND CORONARY ANGIOGRAPHY N/A 08/31/2017   Procedure: LEFT HEART CATH AND CORONARY ANGIOGRAPHY;  Surgeon: Jettie Booze, MD;  Location: [facility];  Service: Cardiovascular;  Laterality: N/A;  . LEFT HEART CATHETERIZATION WITH CORONARY ANGIOGRAM N/A 02/04/2012   Procedure: LEFT HEART CATHETERIZATION WITH CORONARY ANGIOGRAM;  Surgeon: Burnell Blanks, MD;  Location: [facility];  Service: Cardiovascular;  Laterality: N/A;  . POLYPECTOMY     Allergies  Allergen Reactions  . Bee Venom Anaphylaxis  . Actos [Pioglitazone Hydrochloride] Swelling  . Lipitor [Atorvastatin] Other (See Comments)    Patient cannot recall reaction  . Lisinopril Other (See Comments)    cough   No current facility-administered medications on file prior to encounter.    Current Outpatient Medications on File Prior to Encounter  Medication Sig Dispense Refill  . atenolol-chlorthalidone (TENORETIC) 100-25 MG tablet TAKE 1 TABLET BY MOUTH DAILY. 30 tablet 5  . clopidogrel (PLAVIX) 75 MG tablet Take 75 mg by mouth daily.    . diphenhydrAMINE (BENADRYL) 25 mg capsule Take 25 mg by mouth as needed (bee stings).    . EPINEPHrine 0.3 mg/0.3 mL IJ SOAJ injection Inject 0.3 mg into the muscle once as needed (for bee stings).    Marland Kitchen escitalopram (LEXAPRO) 10 MG tablet Take 1 tablet (10 mg total) by mouth daily. 90 tablet 3  . HUMALOG  MIX 75/25 KWIKPEN (75-25) 100 UNIT/ML Kwikpen   5  . insulin lispro protamine-lispro (HUMALOG 75/25 MIX) (75-25) 100 UNIT/ML SUSP injection Inject 60 Units into the skin daily.    . Insulin Pen Needle (ULTICARE MICRO PEN NEEDLES) 32G X 4 MM MISC use with insulin pen to inject insulin 2x daily. 100 each PRN  . isosorbide mononitrate (IMDUR) 30 MG 24 hr tablet TAKE 1 TABLET BY MOUTH DAILY. 90 tablet 0  . levothyroxine (SYNTHROID, LEVOTHROID) 175 MCG tablet TAKE 1 TABLET BY MOUTH DAILY. 30 tablet 11  . linagliptin (TRADJENTA) 5 MG  TABS tablet Take 1 tablet (5 mg total) by mouth daily. 90 tablet 3  . losartan (COZAAR) 100 MG tablet TAKE 1 TABLET BY MOUTH DAILY. 30 tablet 11  . metFORMIN (GLUCOPHAGE) 1000 MG tablet Take 1 tablet (1,000 mg total) by mouth 2 (two) times daily with a meal. 180 tablet 3  . OZEMPIC, 0.25 OR 0.5 MG/DOSE, 2 MG/1.5ML SOPN   6  . potassium chloride (K-DUR) 10 MEQ tablet TAKE 1 TABLET BY MOUTH 2 TIMES DAILY. 60 tablet 11  . predniSONE (DELTASONE) 10 MG tablet Take 10 mg by mouth daily as needed (if stung by bee).    . rosuvastatin (CRESTOR) 20 MG tablet TAKE 1 TABLET BY MOUTH AT BEDTIME. 90 tablet 3  . XARELTO 20 MG TABS tablet TAKE 1 TABLET BY MOUTH DAILY. 30 tablet 11   Social History   Socioeconomic History  . Marital status: Married    Spouse name: Not on file  . Number of children: 1  . Years of education: Not on file  . Highest education level: Not on file  Occupational History  . Occupation: [employer]  . Occupation: GRAVE DIGGER    Employer: [employer]  . Financial resource strain: Not on file  . Food insecurity:    Worry: Not on file    Inability: Not on file  . Transportation needs:    Medical: Not on file    Non-medical: Not on file  Tobacco Use  . Smoking status: Current Every Day Smoker    Packs/day: 1.50    Years: 42.00    Pack years: 63.00    Types: Cigarettes  . Smokeless tobacco: Never Used  Substance and Sexual Activity  . Alcohol use: No  . Drug use: No  . Sexual activity: Not Currently  Lifestyle  . Physical activity:    Days per week: Not on file    Minutes per session: Not on file  . Stress: Not on file  Relationships  . Social connections:    Talks on phone: Not on file    Gets together: Not on file    Attends religious service: Not on file    Active member of club or organization: Not on file    Attends meetings of clubs or organizations: Not on file    Relationship status: Not on file  . Intimate partner violence:     Fear of current or ex partner: Not on file    Emotionally abused: Not on file    Physically abused: Not on file    Forced sexual activity: Not on file  Other Topics Concern  . Not on file  Social History Narrative  . Not on file   Family History  Problem Relation Age of Onset  . Diabetes Father   . Cancer Father        stomach  . CVA Father   .  Stomach cancer Father   . Diabetes Mother   . Cancer Mother        male ca  . Colon cancer Mother 79  . Hypertension Other   . Diabetes Other   . CAD Maternal Grandmother   . Esophageal cancer Neg Hx   . Rectal cancer Neg Hx     OBJECTIVE:  Vitals:   04/21/18 1808  BP: (!) 156/81  Pulse: 64  Resp: 18  Temp: 98.3 F (36.8 C)  TempSrc: Oral  SpO2: 94%    General appearance: Alert; in no acute distress.  Head: NCAT Lungs: normal respiratory effort Heart: Radial pulses 2+; cap refill <2 sec Musculoskeletal: Left wrist Inspection: Skin warm, dry, clear and intact.  Mild swelling diffuse about the wrist Palpation: TTP over distal radius ROM: LROM about the wrist Strength: deferred due to discomfort + Wynn Maudlin Skin: warm and dry Neurologic: Ambulates without difficulty; Sensation intact about the upper extremities Psychological: alert and cooperative; normal mood and affect   DIAGNOSTIC STUDIES:  Dg Wrist Complete Left  Result Date: 04/21/2018 CLINICAL DATA:  Left wrist pain for 2 weeks after a fall, getting worse. EXAM: LEFT WRIST - COMPLETE 3+ VIEW COMPARISON:  None. FINDINGS: Degenerative changes in the radiocarpal and STT joints. No evidence of acute fracture or dislocation. No focal bone lesion or bone destruction. Soft tissues are unremarkable. IMPRESSION: Degenerative changes in the left wrist. No acute bony abnormalities. Electronically Signed   By: Lucienne Capers M.D.   On: 04/21/2018 19:17     ASSESSMENT & PLAN:  1. Left wrist pain   2. De Quervain's tenosynovitis, left     Meds ordered this  encounter  Medications  . acetaminophen (TYLENOL) 500 MG tablet    Sig: Take 1 tablet (500 mg total) by mouth every 6 (six) hours as needed.    Dispense:  30 tablet    Refill:  0    Order Specific Question:   Supervising Provider    Answer:   Raylene Everts [[id number]]   X-rays did not show fracture or dislocation Thumb spica placed Continue conservative management of rest, ice, and elevation Take tylenol as needed for pain Follow up with PCP if symptoms persist Return or go to the ER if you have any new or worsening symptoms (fever, chills, chest pain, abdominal pain, changes in bowel or bladder habits, pain radiating into lower legs, etc...)   Reviewed expectations re: course of current medical issues. Questions answered. Outlined signs and symptoms indicating need for more acute intervention. Patient verbalized understanding. After Visit Summary given.    Lestine Box, PA-C 04/21/18 2027

## 2018-04-21 NOTE — Discharge Instructions (Signed)
X-rays did not show fracture or dislocation Thumb spica placed Continue conservative management of rest, ice, and elevation Take tylenol as needed for pain Follow up with PCP if symptoms persist Return or go to the ER if you have any new or worsening symptoms (fever, chills, chest pain, abdominal pain, changes in bowel or bladder habits, pain radiating into lower legs, etc...)

## 2018-04-24 ENCOUNTER — Ambulatory Visit: Payer: 59 | Admitting: Internal Medicine

## 2018-04-24 ENCOUNTER — Encounter: Payer: Self-pay | Admitting: Internal Medicine

## 2018-04-24 DIAGNOSIS — M25532 Pain in left wrist: Secondary | ICD-10-CM | POA: Diagnosis not present

## 2018-04-24 DIAGNOSIS — I4891 Unspecified atrial fibrillation: Secondary | ICD-10-CM

## 2018-04-24 MED ORDER — PANTOPRAZOLE SODIUM 40 MG PO TBEC: 40.0000 mg | DELAYED_RELEASE_TABLET | Freq: Every day | ORAL | 1 refills | Status: DC

## 2018-04-24 MED ORDER — METHYLPREDNISOLONE 4 MG PO TBPK: ORAL_TABLET | ORAL | 0 refills | Status: DC

## 2018-04-24 NOTE — Progress Notes (Signed)
Subjective:  Patient ID: Anthony Woods, male    DOB: 07-Dec-1955  Age: 63 y.o. MRN: [id number]  CC: No chief complaint on file.   HPI Anthony Woods presents for L wrist pain. He is R handed. Symptoms began after shoveling in his normal capacity for work on 04/20/18. S/p ER visit on 1/31. X ray was ok  Outpatient Medications Prior to Visit  Medication Sig Dispense Refill  . acetaminophen (TYLENOL) 500 MG tablet Take 1 tablet (500 mg total) by mouth every 6 (six) hours as needed. 30 tablet 0  . atenolol-chlorthalidone (TENORETIC) 100-25 MG tablet TAKE 1 TABLET BY MOUTH DAILY. 30 tablet 5  . clopidogrel (PLAVIX) 75 MG tablet Take 75 mg by mouth daily.    . diphenhydrAMINE (BENADRYL) 25 mg capsule Take 25 mg by mouth as needed (bee stings).    . EPINEPHrine 0.3 mg/0.3 mL IJ SOAJ injection Inject 0.3 mg into the muscle once as needed (for bee stings).    Marland Kitchen escitalopram (LEXAPRO) 10 MG tablet Take 1 tablet (10 mg total) by mouth daily. 90 tablet 3  . insulin lispro protamine-lispro (HUMALOG 75/25 MIX) (75-25) 100 UNIT/ML SUSP injection Inject 60 Units into the skin daily.    . Insulin Pen Needle (ULTICARE MICRO PEN NEEDLES) 32G X 4 MM MISC use with insulin pen to inject insulin 2x daily. 100 each PRN  . isosorbide mononitrate (IMDUR) 30 MG 24 hr tablet TAKE 1 TABLET BY MOUTH DAILY. 90 tablet 0  . levothyroxine (SYNTHROID, LEVOTHROID) 175 MCG tablet TAKE 1 TABLET BY MOUTH DAILY. 30 tablet 11  . linagliptin (TRADJENTA) 5 MG TABS tablet Take 1 tablet (5 mg total) by mouth daily. 90 tablet 3  . losartan (COZAAR) 100 MG tablet TAKE 1 TABLET BY MOUTH DAILY. 30 tablet 11  . metFORMIN (GLUCOPHAGE) 1000 MG tablet Take 1 tablet (1,000 mg total) by mouth 2 (two) times daily with a meal. 180 tablet 3  . OZEMPIC, 0.25 OR 0.5 MG/DOSE, 2 MG/1.5ML SOPN   6  . potassium chloride (K-DUR) 10 MEQ tablet TAKE 1 TABLET BY MOUTH 2 TIMES DAILY. 60 tablet 11  . predniSONE (DELTASONE) 10 MG tablet Take 10 mg by mouth  daily as needed (if stung by bee).    . rosuvastatin (CRESTOR) 20 MG tablet TAKE 1 TABLET BY MOUTH AT BEDTIME. 90 tablet 3  . XARELTO 20 MG TABS tablet TAKE 1 TABLET BY MOUTH DAILY. 30 tablet 11  . HUMALOG MIX 75/25 KWIKPEN (75-25) 100 UNIT/ML Kwikpen   5   No facility-administered medications prior to visit.     ROS: Review of Systems  Constitutional: Negative for appetite change, fatigue and unexpected weight change.  HENT: Negative for congestion, nosebleeds, sneezing, sore throat and trouble swallowing.   Eyes: Negative for itching and visual disturbance.  Respiratory: Negative for cough.   Cardiovascular: Negative for chest pain, palpitations and leg swelling.  Gastrointestinal: Negative for abdominal distention, blood in stool, diarrhea and nausea.  Genitourinary: Negative for frequency and hematuria.  Musculoskeletal: Positive for arthralgias. Negative for back pain, gait problem, joint swelling and neck pain.  Skin: Negative for rash.  Neurological: Negative for dizziness, tremors, speech difficulty and weakness.  Psychiatric/Behavioral: Negative for agitation, dysphoric mood and sleep disturbance. The patient is not nervous/anxious.     Objective:  BP 124/74 (BP Location: Left Arm, Patient Position: Sitting, Cuff Size: Large)   Pulse 62   Temp 97.7 F (36.5 C) (Oral)   Ht 5\' 5"  (1.651 m)  Wt 244 lb (110.7 kg)   SpO2 93%   BMI 40.60 kg/m   BP Readings from Last 3 Encounters:  04/24/18 124/74  04/21/18 (!) 156/81  03/31/18 (!) 105/54    Wt Readings from Last 3 Encounters:  04/24/18 244 lb (110.7 kg)  03/31/18 253 lb (114.8 kg)  03/13/18 253 lb 12.8 oz (115.1 kg)    Physical Exam Constitutional:      General: He is not in acute distress.    Appearance: He is well-developed.     Comments: NAD  Eyes:     Conjunctiva/sclera: Conjunctivae normal.     Pupils: Pupils are equal, round, and reactive to light.  Neck:     Musculoskeletal: Normal range of motion.      Thyroid: No thyromegaly.     Vascular: No JVD.  Cardiovascular:     Rate and Rhythm: Normal rate and regular rhythm.     Heart sounds: Normal heart sounds. No murmur. No friction rub. No gallop.   Pulmonary:     Effort: Pulmonary effort is normal. No respiratory distress.     Breath sounds: Normal breath sounds. No wheezing or rales.  Chest:     Chest wall: No tenderness.  Abdominal:     General: Bowel sounds are normal. There is no distension.     Palpations: Abdomen is soft. There is no mass.     Tenderness: There is no abdominal tenderness. There is no guarding or rebound.  Musculoskeletal: Normal range of motion.        General: Swelling and tenderness present.  Lymphadenopathy:     Cervical: No cervical adenopathy.  Skin:    General: Skin is warm and dry.     Findings: No rash.  Neurological:     Mental Status: He is alert and oriented to person, place, and time.     Cranial Nerves: No cranial nerve deficit.     Motor: No abnormal muscle tone.     Coordination: Coordination normal.     Gait: Gait normal.     Deep Tendon Reflexes: Reflexes are normal and symmetric.  Psychiatric:        Behavior: Behavior normal.        Thought Content: Thought content normal.        Judgment: Judgment normal.   L dorsal palm and wrist are swollen and tender w/ROM  Lab Results  Component Value Date   WBC 11.1 (H) 08/29/2017   HGB 15.4 08/29/2017   HCT 45.5 08/29/2017   PLT 222 08/29/2017   GLUCOSE 211 (H) 09/14/2017   CHOL 125 09/14/2017   TRIG 187.0 (H) 09/14/2017   HDL 34.20 (L) 09/14/2017   LDLDIRECT 100.0 12/09/2014   LDLCALC 53 09/14/2017   ALT 21 09/14/2017   AST 19 09/14/2017   NA 138 09/14/2017   K 3.6 09/14/2017   CL 100 09/14/2017   CREATININE 0.94 09/14/2017   BUN 11 09/14/2017   CO2 29 09/14/2017   TSH 1.25 09/14/2017   PSA 0.41 09/14/2017   INR 1.12 05/12/2016   HGBA1C 10.8 (H) 09/14/2017   MICROALBUR 4.7 (H) 12/06/2013    Dg Wrist Complete  Left  Result Date: 04/21/2018 CLINICAL DATA:  Left wrist pain for 2 weeks after a fall, getting worse. EXAM: LEFT WRIST - COMPLETE 3+ VIEW COMPARISON:  None. FINDINGS: Degenerative changes in the radiocarpal and STT joints. No evidence of acute fracture or dislocation. No focal bone lesion or bone destruction. Soft tissues are unremarkable. IMPRESSION: Degenerative  changes in the left wrist. No acute bony abnormalities. Electronically Signed   By: Lucienne Capers M.D.   On: 04/21/2018 19:17    Assessment & Plan:   There are no diagnoses linked to this encounter.   No orders of the defined types were placed in this encounter.    Follow-up: No follow-ups on file.  Walker Kehr, MD

## 2018-04-24 NOTE — Assessment & Plan Note (Signed)
No NSAIDs 

## 2018-04-24 NOTE — Assessment & Plan Note (Signed)
OA/tendonitis Cont w/wrist splint Medrol dosepack RTC 1 week

## 2018-05-01 ENCOUNTER — Ambulatory Visit: Payer: 59 | Admitting: Internal Medicine

## 2018-05-02 ENCOUNTER — Ambulatory Visit
Admission: EM | Admit: 2018-05-02 | Discharge: 2018-05-02 | Disposition: A | Discharge status: Home / Self Care | Payer: 59 | Attending: Nurse Practitioner | Admitting: Nurse Practitioner

## 2018-05-02 ENCOUNTER — Ambulatory Visit (INDEPENDENT_AMBULATORY_CARE_PROVIDER_SITE_OTHER): Payer: 59

## 2018-05-02 DIAGNOSIS — R05 Cough: Secondary | ICD-10-CM | POA: Diagnosis not present

## 2018-05-02 DIAGNOSIS — J206 Acute bronchitis due to rhinovirus: Secondary | ICD-10-CM

## 2018-05-02 DIAGNOSIS — R079 Chest pain, unspecified: Secondary | ICD-10-CM | POA: Diagnosis not present

## 2018-05-02 DIAGNOSIS — R0602 Shortness of breath: Secondary | ICD-10-CM | POA: Diagnosis not present

## 2018-05-02 MED ORDER — METHYLPREDNISOLONE SODIUM SUCC 125 MG IJ SOLR
125.0000 mg | Freq: Once | INTRAMUSCULAR | Status: AC
  Administered 2018-05-02: 125 mg via INTRAMUSCULAR

## 2018-05-02 MED ORDER — ALBUTEROL SULFATE HFA 108 (90 BASE) MCG/ACT IN AERS: 1.0000 | INHALATION_SPRAY | Freq: Four times a day (QID) | RESPIRATORY_TRACT | 0 refills | Status: DC | PRN

## 2018-05-02 MED ORDER — CEFDINIR 300 MG PO CAPS: 300.0000 mg | ORAL_CAPSULE | Freq: Two times a day (BID) | ORAL | 0 refills | Status: DC

## 2018-05-02 MED ORDER — LEVOFLOXACIN 500 MG PO TABS: 500.0000 mg | ORAL_TABLET | Freq: Every day | ORAL | 0 refills | Status: DC

## 2018-05-02 MED ORDER — IPRATROPIUM-ALBUTEROL 0.5-2.5 (3) MG/3ML IN SOLN
3.0000 mL | Freq: Once | RESPIRATORY_TRACT | Status: AC
  Administered 2018-05-02: 3 mL via RESPIRATORY_TRACT

## 2018-05-02 MED ORDER — BENZONATATE 100 MG PO CAPS: 100.0000 mg | ORAL_CAPSULE | Freq: Three times a day (TID) | ORAL | 0 refills | Status: DC | PRN

## 2018-05-02 NOTE — Discharge Instructions (Addendum)
Take medications as prescribed.  Drink plenty of fluids.  Stop smoking.  Follow-up with your doctor in 1 week.  To the ED if you are worse.

## 2018-05-02 NOTE — ED Provider Notes (Addendum)
[facility]    CSN: [id number] Arrival date & time: 05/02/18  1009     History   Chief Complaint Chief Complaint  Patient presents with  . Cough    HPI Anthony Woods is a 64 y.o. male.   Subjective:   History was provided by the patient and wife.  Anthony Woods is a 63 y.o. male who presents for evaluation of symptoms of a URI. Symptoms include suspected fevers but not measured at home, sweats, chest congestion, nasal discharge, post nasal drip, productive cough, shortness of breath and wheezing. Onset of symptoms was 4 days ago and has been unchanged since that time. He is drinking plenty of fluids. Evaluation to date: none. Treatment to date: OTC cough/cold medications which has provided minimal relief. Patient smokes 2.5 packs of cigarettes daily.  The following portions of the patient's history were reviewed and updated as appropriate: allergies, current medications, past family history, past medical history, past social history, past surgical history and problem list.        Past Medical History:  Diagnosis Date  . Allergy to bee sting   . Anxiety   . Arthritis    "right knee" (05/12/2016)  . Atrial fibrillation [facility])    has 2 stents feb 2018 and June 2019  . CAD (coronary artery disease)   . Headache    "when his sugar goes too high or too low" (05/12/2016)  . HTN (hypertension)   . Hyperlipidemia   . Hypothyroidism   . Macular degeneration   . Obesity   . Type II diabetes mellitus [facility])     Patient Active Problem List   Diagnosis Date Noted  . Wrist pain, acute, left 04/24/2018  . Coronary artery disease involving native coronary artery of native heart with unstable angina pectoris ([place])   . Stress at work 12/15/2016  . Tobacco dependence 06/04/2016  . Atrial fibrillation with RVR ([place]) 05/12/2016  . Elevated troponin   . Atrial fibrillation ([place]) 05/11/2016  . Allergic rhinitis 08/12/2015  . Chest pain with moderate risk of acute  coronary syndrome 04/14/2015  . Urinary frequency 04/14/2015  . Osteoarthritis of right knee 12/09/2014  . Snoring 09/25/2013  . Rash and nonspecific skin eruption 08/08/2012  . Insomnia 08/08/2012  . Morbid obesity ([place]) 06/10/2011  . Well adult exam 06/10/2011  . Cough due to angiotensin-converting enzyme inhibitor 10/08/2010  . SHOULDER PAIN 03/09/2010  . NECK PAIN 03/09/2010  . TOBACCO USER 06/05/2009  . BRONCHITIS, ACUTE 12/13/2007  . Anaphylactic reaction to bee sting 08/02/2007  . Hypothyroidism 04/04/2007  . Type II diabetes mellitus, uncontrolled ([place]) 04/04/2007  . Essential hypertension 04/04/2007  . Coronary atherosclerosis 04/04/2007  . Edema 04/04/2007    Past Surgical History:  Procedure Laterality Date  . CARDIAC CATHETERIZATION    . COLONOSCOPY    . CORONARY ANGIOPLASTY WITH STENT PLACEMENT  05/12/2016  . CORONARY STENT INTERVENTION N/A 05/12/2016   Procedure: Coronary Stent Intervention;  Surgeon: Jettie Booze, MD;  Location: [facility];  Service: Cardiovascular;  Laterality: N/A;  . CORONARY STENT INTERVENTION N/A 08/31/2017   Procedure: CORONARY STENT INTERVENTION;  Surgeon: Jettie Booze, MD;  Location: [facility];  Service: Cardiovascular;  Laterality: N/A;  . GANGLION CYST EXCISION Left 1980s  . KNEE ARTHROSCOPY Right    Meniscus removal  . LEFT HEART CATH AND CORONARY ANGIOGRAPHY N/A 05/12/2016   Procedure: Left Heart Cath and Coronary Angiography;  Surgeon: Jettie Booze, MD;  Location: [facility];  Service: Cardiovascular;  Laterality: N/A;  . LEFT HEART CATH AND CORONARY ANGIOGRAPHY N/A 08/31/2017   Procedure: LEFT HEART CATH AND CORONARY ANGIOGRAPHY;  Surgeon: Jettie Booze, MD;  Location: [facility];  Service: Cardiovascular;  Laterality: N/A;  . LEFT HEART CATHETERIZATION WITH CORONARY ANGIOGRAM N/A 02/04/2012   Procedure: LEFT HEART CATHETERIZATION WITH CORONARY ANGIOGRAM;  Surgeon: Burnell Blanks, MD;  Location: [facility];  Service: Cardiovascular;  Laterality: N/A;  . POLYPECTOMY         Home Medications    Prior to Admission medications   Medication Sig Start Date End Date Taking? Authorizing Provider  acetaminophen (TYLENOL) 500 MG tablet Take 1 tablet (500 mg total) by mouth every 6 (six) hours as needed. 04/21/18   Wurst, [place], PA-C  albuterol (PROVENTIL HFA;VENTOLIN HFA) 108 (90 Base) MCG/ACT inhaler Inhale 1-2 puffs into the lungs every 6 (six) hours as needed for wheezing or shortness of breath. 05/02/18   Enrique Sack, FNP  atenolol-chlorthalidone (TENORETIC) 100-25 MG tablet TAKE 1 TABLET BY MOUTH DAILY. 01/06/18   Plotnikov, Evie Lacks, MD  benzonatate (TESSALON) 100 MG capsule Take 1 capsule (100 mg total) by mouth 3 (three) times daily as needed for cough. 05/02/18   Enrique Sack, FNP  cefdinir (OMNICEF) 300 MG capsule Take 1 capsule (300 mg total) by mouth 2 (two) times daily for 7 days. 05/02/18 05/09/18  Enrique Sack, FNP  clopidogrel (PLAVIX) 75 MG tablet Take 75 mg by mouth daily.    [provider]  diphenhydrAMINE (BENADRYL) 25 mg capsule Take 25 mg by mouth as needed (bee stings).    [provider]  EPINEPHrine 0.3 mg/0.3 mL IJ SOAJ injection Inject 0.3 mg into the muscle once as needed (for bee stings).    [provider]  escitalopram (LEXAPRO) 10 MG tablet Take 1 tablet (10 mg total) by mouth daily. 12/20/17   Plotnikov, Evie Lacks, MD  insulin lispro protamine-lispro (HUMALOG 75/25 MIX) (75-25) 100 UNIT/ML SUSP injection Inject 60 Units into the skin daily.    [provider]  Insulin Pen Needle (ULTICARE MICRO PEN NEEDLES) 32G X 4 MM MISC use with insulin pen to inject insulin 2x daily. 11/19/16   Renato Shin, MD  isosorbide mononitrate (IMDUR) 30 MG 24 hr tablet TAKE 1 TABLET BY MOUTH DAILY. 03/24/18   Lyda Jester M, PA-C  levothyroxine (SYNTHROID, LEVOTHROID) 175 MCG tablet TAKE 1 TABLET BY  MOUTH DAILY. 01/11/18   Plotnikov, Evie Lacks, MD  linagliptin (TRADJENTA) 5 MG TABS tablet Take 1 tablet (5 mg total) by mouth daily. 08/12/15   Plotnikov, Evie Lacks, MD  losartan (COZAAR) 100 MG tablet TAKE 1 TABLET BY MOUTH DAILY. 12/19/17   Plotnikov, Evie Lacks, MD  metFORMIN (GLUCOPHAGE) 1000 MG tablet Take 1 tablet (1,000 mg total) by mouth 2 (two) times daily with a meal. 08/12/15   Plotnikov, Evie Lacks, MD  methylPREDNISolone (MEDROL DOSEPAK) 4 MG TBPK tablet As directed 04/24/18   Plotnikov, Evie Lacks, MD  OZEMPIC, 0.25 OR 0.5 MG/DOSE, 2 MG/1.5ML SOPN  12/08/17   [provider]  pantoprazole (PROTONIX) 40 MG tablet Take 1 tablet (40 mg total) by mouth daily. 04/24/18   Plotnikov, Evie Lacks, MD  potassium chloride (K-DUR) 10 MEQ tablet TAKE 1 TABLET BY MOUTH 2 TIMES DAILY. 04/14/18   Plotnikov, Evie Lacks, MD  predniSONE (DELTASONE) 10 MG tablet Take 10 mg by mouth daily as needed (if stung by bee).  [provider]  rosuvastatin (CRESTOR) 20 MG tablet TAKE 1 TABLET BY MOUTH AT BEDTIME. 11/10/17   Reino Bellis B, NP  XARELTO 20 MG TABS tablet TAKE 1 TABLET BY MOUTH DAILY. 12/19/17   Plotnikov, Evie Lacks, MD    Family History Family History  Problem Relation Age of Onset  . Diabetes Father   . Cancer Father        stomach  . CVA Father   . Stomach cancer Father   . Diabetes Mother   . Cancer Mother        male ca  . Colon cancer Mother 23  . Hypertension Other   . Diabetes Other   . CAD Maternal Grandmother   . Esophageal cancer Neg Hx   . Rectal cancer Neg Hx     Social History Social History   Tobacco Use  . Smoking status: Current Every Day Smoker    Packs/day: 1.50    Years: 42.00    Pack years: 63.00    Types: Cigarettes  . Smokeless tobacco: Never Used  Substance Use Topics  . Alcohol use: No  . Drug use: No     Allergies   Bee venom; Actos [pioglitazone hydrochloride]; Lipitor [atorvastatin]; and Lisinopril   Review of Systems Review of  Systems  Constitutional: Positive for diaphoresis and fever.  HENT: Positive for congestion and rhinorrhea.   Eyes: Negative.   Respiratory: Positive for cough, shortness of breath and wheezing.   Cardiovascular: Negative.   All other systems reviewed and are negative.    Physical Exam Triage Vital Signs ED Triage Vitals [05/02/18 1025]  Enc Vitals Group     BP (!) 164/85     Pulse Rate 63     Resp 18     Temp 98.1 F (36.7 C)     Temp Source Oral     SpO2 93 %     Weight      Height      Head Circumference      Peak Flow      Pain Score 5     Pain Loc      Pain Edu?      Excl. in [place]?    No data found.  Updated Vital Signs BP (!) 164/85 (BP Location: Left Arm)   Pulse 63   Temp 98.1 F (36.7 C) (Oral)   Resp 18   SpO2 93%   Visual Acuity Right Eye Distance:   Left Eye Distance:   Bilateral Distance:    Right Eye Near:   Left Eye Near:    Bilateral Near:     Physical Exam Vitals signs reviewed.  Constitutional:      General: He is not in acute distress.    Appearance: Normal appearance. He is not ill-appearing, toxic-appearing or diaphoretic.  HENT:     Head: Normocephalic.     Nose: Nose normal.     Mouth/Throat:     Mouth: Mucous membranes are moist.     Pharynx: Oropharynx is clear.  Neck:     Musculoskeletal: Normal range of motion.  Cardiovascular:     Rate and Rhythm: Normal rate and regular rhythm.  Pulmonary:     Effort: Pulmonary effort is normal. No respiratory distress.     Breath sounds: Wheezing present.  Musculoskeletal: Normal range of motion.  Lymphadenopathy:     Cervical: No cervical adenopathy.  Skin:    General: Skin is warm and dry.  Neurological:  General: No focal deficit present.     Mental Status: He is alert and oriented to person, place, and time.  Psychiatric:        Mood and Affect: Mood normal.        Behavior: Behavior normal.      UC Treatments / Results  Labs (all labs ordered are listed, but only  abnormal results are displayed) Labs Reviewed - No data to display  EKG None  Radiology Dg Chest 2 View  Result Date: 05/02/2018 CLINICAL DATA:  Cough, shortness of breath, chest pain. EXAM: CHEST - 2 VIEW COMPARISON:  Radiographs of Aug 18, 2017. FINDINGS: The heart size and mediastinal contours are within normal limits. Both lungs are clear. No pneumothorax or pleural effusion is noted. The visualized skeletal structures are unremarkable. IMPRESSION: No active cardiopulmonary disease. Aortic Atherosclerosis (ICD10-I70.0). Electronically Signed   By: Marijo Conception, M.D.   On: 05/02/2018 11:30    Procedures Procedures (including critical care time)  Medications Ordered in UC Medications  ipratropium-albuterol (DUONEB) 0.5-2.5 (3) MG/3ML nebulizer solution 3 mL (3 mLs Nebulization Given 05/02/18 1108)  methylPREDNISolone sodium succinate (SOLU-MEDROL) 125 mg/2 mL injection 125 mg (125 mg Intramuscular Given 05/02/18 1152)    Initial Impression / Assessment and Plan / UC Course  I have reviewed the triage vital signs and the nursing notes.  Pertinent labs & imaging results that were available during my care of the patient were reviewed by me and considered in my medical decision making (see chart for details).     63 year old male with multiple comorbid conditions presenting with a 4-day history of URI type symptoms.  Patient is afebrile.  Nontoxic-appearing.  O2 sats 93% on room air.  Patient has some wheezing.  No acute distress noted. Patient received DuoNeb in clinic. CXR negative for any active cardiopulmonary disease.  Wheezing resolved after nebulizer.  O2 sats remained around 93% on room air.  It is likely that this is patient's baseline as he is a chronic smoker and probably has some underlying COPD/chronic CO2 retention.  Will give patient steroid injection in the clinic versus oral steroids due to his history of diabetes/insulin use.  Discussed importance of monitoring glucose  very closely and adjusting insulin sliding scale accordingly.  Patient will be sent home on antibiotics, proAir inhaler prn and tessalon perles prn. Highly encouraged patient to stop smoking.  Today's evaluation has revealed no signs of a dangerous process. Discussed diagnosis with patient. Patient aware of their diagnosis, possible red flag symptoms to watch out for and need for close follow up. Patient understands verbal and written discharge instructions. Patient comfortable with plan and disposition.  Patient has a clear mental status at this time, good insight into illness (after discussion and teaching) and has clear judgment to make decisions regarding their care.  Documentation was completed with the aid of voice recognition software. Transcription may contain typographical errors. Final Clinical Impressions(s) / UC Diagnoses   Final diagnoses:  Acute bronchitis due to Rhinovirus     Discharge Instructions     Take medications as prescribed.  Drink plenty of fluids.  Stop smoking.  Follow-up with your doctor in 1 week.  To the ED if you are worse.    ED Prescriptions    Medication Sig Dispense Auth. Provider   levofloxacin (LEVAQUIN) 500 MG tablet  (Status: Discontinued) Take 1 tablet (500 mg total) by mouth daily. 7 tablet Enrique Sack, FNP   albuterol (PROVENTIL HFA;VENTOLIN HFA) 108 (90 Base) MCG/ACT  inhaler Inhale 1-2 puffs into the lungs every 6 (six) hours as needed for wheezing or shortness of breath. 1 Inhaler Mira Balon, [place], FNP   benzonatate (TESSALON) 100 MG capsule Take 1 capsule (100 mg total) by mouth 3 (three) times daily as needed for cough. 21 capsule Enrique Sack, FNP   cefdinir (OMNICEF) 300 MG capsule Take 1 capsule (300 mg total) by mouth 2 (two) times daily for 7 days. 14 capsule Enrique Sack, FNP     Controlled Substance Prescriptions [place] Controlled Substance Registry consulted? Not Applicable   Enrique Sack, [place] 05/02/18 1147      Enrique Sack, [place] 05/02/18 1321

## 2018-05-02 NOTE — ED Triage Notes (Signed)
Pt c/o cough, headache, bodyaches, fever and SOB x4days

## 2018-05-09 ENCOUNTER — Encounter: Payer: Self-pay | Admitting: Internal Medicine

## 2018-05-09 ENCOUNTER — Ambulatory Visit: Payer: 59 | Admitting: Internal Medicine

## 2018-05-09 DIAGNOSIS — M25532 Pain in left wrist: Secondary | ICD-10-CM

## 2018-05-09 DIAGNOSIS — J209 Acute bronchitis, unspecified: Secondary | ICD-10-CM

## 2018-05-09 NOTE — Assessment & Plan Note (Signed)
Finish abx better

## 2018-05-09 NOTE — Assessment & Plan Note (Signed)
Better  

## 2018-05-09 NOTE — Progress Notes (Signed)
Subjective:  Patient ID: Anthony Woods, male    DOB: [birth year]  Age: 63 y.o. MRN: [id number]  CC: No chief complaint on file.   HPI Anthony Woods presents for acute bronchitis f/u; s/p UC visit on 05/02/18 - he took Cefdinir. F/u wrist pain - better.  Outpatient Medications Prior to Visit  Medication Sig Dispense Refill  . acetaminophen (TYLENOL) 500 MG tablet Take 1 tablet (500 mg total) by mouth every 6 (six) hours as needed. 30 tablet 0  . albuterol (PROVENTIL HFA;VENTOLIN HFA) 108 (90 Base) MCG/ACT inhaler Inhale 1-2 puffs into the lungs every 6 (six) hours as needed for wheezing or shortness of breath. 1 Inhaler 0  . atenolol-chlorthalidone (TENORETIC) 100-25 MG tablet TAKE 1 TABLET BY MOUTH DAILY. 30 tablet 5  . benzonatate (TESSALON) 100 MG capsule Take 1 capsule (100 mg total) by mouth 3 (three) times daily as needed for cough. 21 capsule 0  . cefdinir (OMNICEF) 300 MG capsule Take 1 capsule (300 mg total) by mouth 2 (two) times daily for 7 days. 14 capsule 0  . clopidogrel (PLAVIX) 75 MG tablet Take 75 mg by mouth daily.    . diphenhydrAMINE (BENADRYL) 25 mg capsule Take 25 mg by mouth as needed (bee stings).    . EPINEPHrine 0.3 mg/0.3 mL IJ SOAJ injection Inject 0.3 mg into the muscle once as needed (for bee stings).    Marland Kitchen escitalopram (LEXAPRO) 10 MG tablet Take 1 tablet (10 mg total) by mouth daily. 90 tablet 3  . insulin lispro protamine-lispro (HUMALOG 75/25 MIX) (75-25) 100 UNIT/ML SUSP injection Inject 60 Units into the skin daily.    . Insulin Pen Needle (ULTICARE MICRO PEN NEEDLES) 32G X 4 MM MISC use with insulin pen to inject insulin 2x daily. 100 each PRN  . isosorbide mononitrate (IMDUR) 30 MG 24 hr tablet TAKE 1 TABLET BY MOUTH DAILY. 90 tablet 0  . levothyroxine (SYNTHROID, LEVOTHROID) 175 MCG tablet TAKE 1 TABLET BY MOUTH DAILY. 30 tablet 11  . linagliptin (TRADJENTA) 5 MG TABS tablet Take 1 tablet (5 mg total) by mouth daily. 90 tablet 3  . losartan (COZAAR) 100  MG tablet TAKE 1 TABLET BY MOUTH DAILY. 30 tablet 11  . metFORMIN (GLUCOPHAGE) 1000 MG tablet Take 1 tablet (1,000 mg total) by mouth 2 (two) times daily with a meal. 180 tablet 3  . methylPREDNISolone (MEDROL DOSEPAK) 4 MG TBPK tablet As directed 21 tablet 0  . OZEMPIC, 0.25 OR 0.5 MG/DOSE, 2 MG/1.5ML SOPN   6  . pantoprazole (PROTONIX) 40 MG tablet Take 1 tablet (40 mg total) by mouth daily. 30 tablet 1  . potassium chloride (K-DUR) 10 MEQ tablet TAKE 1 TABLET BY MOUTH 2 TIMES DAILY. 60 tablet 11  . predniSONE (DELTASONE) 10 MG tablet Take 10 mg by mouth daily as needed (if stung by bee).    . rosuvastatin (CRESTOR) 20 MG tablet TAKE 1 TABLET BY MOUTH AT BEDTIME. 90 tablet 3  . XARELTO 20 MG TABS tablet TAKE 1 TABLET BY MOUTH DAILY. 30 tablet 11   No facility-administered medications prior to visit.     ROS: Review of Systems  Objective:  BP 134/72 (BP Location: Left Arm, Patient Position: Sitting, Cuff Size: Large)   Pulse 74   Temp 98.4 F (36.9 C) (Oral)   Ht 5\' 5"  (1.651 m)   Wt 248 lb (112.5 kg)   SpO2 92%   BMI 41.27 kg/m   BP Readings from Last 3 Encounters:  05/09/18 134/72  05/02/18 (!) 164/85  04/24/18 124/74    Wt Readings from Last 3 Encounters:  05/09/18 248 lb (112.5 kg)  04/24/18 244 lb (110.7 kg)  03/31/18 253 lb (114.8 kg)    Physical Exam  Lab Results  Component Value Date   WBC 11.1 (H) 08/29/2017   HGB 15.4 08/29/2017   HCT 45.5 08/29/2017   PLT 222 08/29/2017   GLUCOSE 211 (H) 09/14/2017   CHOL 125 09/14/2017   TRIG 187.0 (H) 09/14/2017   HDL 34.20 (L) 09/14/2017   LDLDIRECT 100.0 12/09/2014   LDLCALC 53 09/14/2017   ALT 21 09/14/2017   AST 19 09/14/2017   NA 138 09/14/2017   K 3.6 09/14/2017   CL 100 09/14/2017   CREATININE 0.94 09/14/2017   BUN 11 09/14/2017   CO2 29 09/14/2017   TSH 1.25 09/14/2017   PSA 0.41 09/14/2017   INR 1.12 05/12/2016   HGBA1C 10.8 (H) 09/14/2017   MICROALBUR 4.7 (H) 12/06/2013    Dg Chest 2  View  Result Date: 05/02/2018 CLINICAL DATA:  Cough, shortness of breath, chest pain. EXAM: CHEST - 2 VIEW COMPARISON:  Radiographs of Aug 18, 2017. FINDINGS: The heart size and mediastinal contours are within normal limits. Both lungs are clear. No pneumothorax or pleural effusion is noted. The visualized skeletal structures are unremarkable. IMPRESSION: No active cardiopulmonary disease. Aortic Atherosclerosis (ICD10-I70.0). Electronically Signed   By: Marijo Conception, M.D.   On: 05/02/2018 11:30    Assessment & Plan:   There are no diagnoses linked to this encounter.   No orders of the defined types were placed in this encounter.    Follow-up: No follow-ups on file.  Walker Kehr, MD

## 2018-06-13 ENCOUNTER — Other Ambulatory Visit: Payer: Self-pay | Admitting: Internal Medicine

## 2018-06-16 ENCOUNTER — Other Ambulatory Visit: Payer: Self-pay | Admitting: Cardiology

## 2018-06-16 ENCOUNTER — Other Ambulatory Visit: Payer: Self-pay | Admitting: Internal Medicine

## 2018-06-23 ENCOUNTER — Other Ambulatory Visit: Payer: Self-pay | Admitting: Internal Medicine

## 2018-06-27 ENCOUNTER — Other Ambulatory Visit: Payer: Self-pay | Admitting: Internal Medicine

## 2018-09-08 ENCOUNTER — Observation Stay (HOSPITAL_COMMUNITY)
Admission: EM | Admit: 2018-09-08 | Discharge: 2018-09-09 | Disposition: A | Discharge status: Home / Self Care | Payer: 59 | Attending: Internal Medicine | Admitting: Internal Medicine

## 2018-09-08 ENCOUNTER — Encounter (HOSPITAL_COMMUNITY): Payer: Self-pay

## 2018-09-08 ENCOUNTER — Telehealth: Payer: Self-pay | Admitting: Cardiovascular Disease

## 2018-09-08 ENCOUNTER — Other Ambulatory Visit: Payer: Self-pay

## 2018-09-08 ENCOUNTER — Observation Stay (HOSPITAL_BASED_OUTPATIENT_CLINIC_OR_DEPARTMENT_OTHER): Payer: 59

## 2018-09-08 ENCOUNTER — Emergency Department (HOSPITAL_COMMUNITY): Payer: 59

## 2018-09-08 DIAGNOSIS — Z955 Presence of coronary angioplasty implant and graft: Secondary | ICD-10-CM | POA: Diagnosis not present

## 2018-09-08 DIAGNOSIS — E119 Type 2 diabetes mellitus without complications: Secondary | ICD-10-CM | POA: Diagnosis not present

## 2018-09-08 DIAGNOSIS — R0789 Other chest pain: Principal | ICD-10-CM | POA: Insufficient documentation

## 2018-09-08 DIAGNOSIS — Z6839 Body mass index (BMI) 39.0-39.9, adult: Secondary | ICD-10-CM | POA: Insufficient documentation

## 2018-09-08 DIAGNOSIS — Z79890 Hormone replacement therapy (postmenopausal): Secondary | ICD-10-CM | POA: Insufficient documentation

## 2018-09-08 DIAGNOSIS — Z7902 Long term (current) use of antithrombotics/antiplatelets: Secondary | ICD-10-CM | POA: Diagnosis not present

## 2018-09-08 DIAGNOSIS — I700 Atherosclerosis of aorta: Secondary | ICD-10-CM | POA: Insufficient documentation

## 2018-09-08 DIAGNOSIS — Z7982 Long term (current) use of aspirin: Secondary | ICD-10-CM | POA: Insufficient documentation

## 2018-09-08 DIAGNOSIS — R0602 Shortness of breath: Secondary | ICD-10-CM | POA: Diagnosis present

## 2018-09-08 DIAGNOSIS — R9431 Abnormal electrocardiogram [ECG] [EKG]: Secondary | ICD-10-CM | POA: Diagnosis not present

## 2018-09-08 DIAGNOSIS — F17210 Nicotine dependence, cigarettes, uncomplicated: Secondary | ICD-10-CM | POA: Diagnosis not present

## 2018-09-08 DIAGNOSIS — Z794 Long term (current) use of insulin: Secondary | ICD-10-CM | POA: Insufficient documentation

## 2018-09-08 DIAGNOSIS — E669 Obesity, unspecified: Secondary | ICD-10-CM | POA: Diagnosis not present

## 2018-09-08 DIAGNOSIS — Z7952 Long term (current) use of systemic steroids: Secondary | ICD-10-CM | POA: Insufficient documentation

## 2018-09-08 DIAGNOSIS — Z79899 Other long term (current) drug therapy: Secondary | ICD-10-CM | POA: Insufficient documentation

## 2018-09-08 DIAGNOSIS — E785 Hyperlipidemia, unspecified: Secondary | ICD-10-CM | POA: Insufficient documentation

## 2018-09-08 DIAGNOSIS — E039 Hypothyroidism, unspecified: Secondary | ICD-10-CM | POA: Insufficient documentation

## 2018-09-08 DIAGNOSIS — Z1159 Encounter for screening for other viral diseases: Secondary | ICD-10-CM | POA: Diagnosis not present

## 2018-09-08 DIAGNOSIS — R079 Chest pain, unspecified: Secondary | ICD-10-CM

## 2018-09-08 DIAGNOSIS — I25119 Atherosclerotic heart disease of native coronary artery with unspecified angina pectoris: Secondary | ICD-10-CM

## 2018-09-08 DIAGNOSIS — I480 Paroxysmal atrial fibrillation: Secondary | ICD-10-CM | POA: Insufficient documentation

## 2018-09-08 DIAGNOSIS — Z9111 Patient's noncompliance with dietary regimen: Secondary | ICD-10-CM | POA: Diagnosis not present

## 2018-09-08 DIAGNOSIS — I2510 Atherosclerotic heart disease of native coronary artery without angina pectoris: Secondary | ICD-10-CM | POA: Insufficient documentation

## 2018-09-08 DIAGNOSIS — E11649 Type 2 diabetes mellitus with hypoglycemia without coma: Secondary | ICD-10-CM

## 2018-09-08 DIAGNOSIS — I10 Essential (primary) hypertension: Secondary | ICD-10-CM | POA: Insufficient documentation

## 2018-09-08 LAB — CBC WITH DIFFERENTIAL/PLATELET
Abs Immature Granulocytes: 0.04 10*3/uL (ref 0.00–0.07)
Basophils Absolute: 0.1 10*3/uL (ref 0.0–0.1)
Basophils Relative: 1 %
Eosinophils Absolute: 0.2 10*3/uL (ref 0.0–0.5)
Eosinophils Relative: 2 %
HCT: 48.7 % (ref 39.0–52.0)
Hemoglobin: 16.3 g/dL (ref 13.0–17.0)
Immature Granulocytes: 0 %
Lymphocytes Relative: 22 %
Lymphs Abs: 2 10*3/uL (ref 0.7–4.0)
MCH: 30.7 pg (ref 26.0–34.0)
MCHC: 33.5 g/dL (ref 30.0–36.0)
MCV: 91.7 fL (ref 80.0–100.0)
Monocytes Absolute: 0.6 10*3/uL (ref 0.1–1.0)
Monocytes Relative: 7 %
Neutro Abs: 6.1 10*3/uL (ref 1.7–7.7)
Neutrophils Relative %: 68 %
Platelets: 181 10*3/uL (ref 150–400)
RBC: 5.31 MIL/uL (ref 4.22–5.81)
RDW: 12.2 % (ref 11.5–15.5)
WBC: 9 10*3/uL (ref 4.0–10.5)
nRBC: 0 % (ref 0.0–0.2)

## 2018-09-08 LAB — TROPONIN I
Troponin I: <0.03 ng/mL (ref <0.03)
Troponin I: <0.03 ng/mL (ref <0.03)
Troponin I: <0.03 ng/mL (ref <0.03)

## 2018-09-08 LAB — ECHOCARDIOGRAM COMPLETE
Height: 65 in
Weight: 3840 oz

## 2018-09-08 LAB — HEMOGLOBIN A1C
Hgb A1c MFr Bld: 9.3 % — ABNORMAL HIGH (ref 4.8–5.6)
Mean Plasma Glucose: 220.21 mg/dL

## 2018-09-08 LAB — BASIC METABOLIC PANEL
Anion gap: 10 (ref 5–15)
BUN: 9 mg/dL (ref 8–23)
CO2: 27 mmol/L (ref 22–32)
Calcium: 9.7 mg/dL (ref 8.9–10.3)
Chloride: 97 mmol/L — ABNORMAL LOW (ref 98–111)
Creatinine, Ser: 1.04 mg/dL (ref 0.61–1.24)
GFR calc Af Amer: >60 mL/min (ref >60)
GFR calc non Af Amer: >60 mL/min (ref >60)
Glucose, Bld: 234 mg/dL — ABNORMAL HIGH (ref 70–99)
Potassium: 3.6 mmol/L (ref 3.5–5.1)
Sodium: 134 mmol/L — ABNORMAL LOW (ref 135–145)

## 2018-09-08 LAB — GLUCOSE, CAPILLARY
Glucose-Capillary: 149 mg/dL — ABNORMAL HIGH (ref 70–99)
Glucose-Capillary: 41 mg/dL — CL (ref 70–99)
Glucose-Capillary: 41 mg/dL — CL (ref 70–99)
Glucose-Capillary: 47 mg/dL — ABNORMAL LOW (ref 70–99)
Glucose-Capillary: 85 mg/dL (ref 70–99)

## 2018-09-08 LAB — SARS CORONAVIRUS 2: SARS Coronavirus 2: NOT DETECTED

## 2018-09-08 LAB — BRAIN NATRIURETIC PEPTIDE: B Natriuretic Peptide: 141.1 pg/mL — ABNORMAL HIGH (ref 0.0–100.0)

## 2018-09-08 MED ORDER — ESCITALOPRAM OXALATE 10 MG PO TABS
10.0000 mg | ORAL_TABLET | Freq: Every day | ORAL | Status: DC
  Administered 2018-09-09: 10 mg via ORAL
  Filled 2018-09-08: qty 1

## 2018-09-08 MED ORDER — IPRATROPIUM-ALBUTEROL 0.5-2.5 (3) MG/3ML IN SOLN: 3.0000 mL | RESPIRATORY_TRACT | Status: DC | PRN

## 2018-09-08 MED ORDER — ASPIRIN 81 MG PO CHEW
162.0000 mg | CHEWABLE_TABLET | Freq: Once | ORAL | Status: AC
  Administered 2018-09-08: 12:00:00 162 mg via ORAL
  Filled 2018-09-08: qty 2

## 2018-09-08 MED ORDER — CLOPIDOGREL BISULFATE 75 MG PO TABS
75.0000 mg | ORAL_TABLET | Freq: Every day | ORAL | Status: DC
  Administered 2018-09-09: 75 mg via ORAL
  Filled 2018-09-08: qty 1

## 2018-09-08 MED ORDER — IPRATROPIUM-ALBUTEROL 0.5-2.5 (3) MG/3ML IN SOLN
3.0000 mL | Freq: Four times a day (QID) | RESPIRATORY_TRACT | Status: DC
  Administered 2018-09-08: 3 mL via RESPIRATORY_TRACT
  Filled 2018-09-08: qty 3

## 2018-09-08 MED ORDER — ONDANSETRON HCL 4 MG PO TABS: 4.0000 mg | ORAL_TABLET | Freq: Four times a day (QID) | ORAL | Status: DC | PRN

## 2018-09-08 MED ORDER — IPRATROPIUM-ALBUTEROL 20-100 MCG/ACT IN AERS: 1.0000 | INHALATION_SPRAY | Freq: Four times a day (QID) | RESPIRATORY_TRACT | Status: DC

## 2018-09-08 MED ORDER — INSULIN ASPART 100 UNIT/ML ~~LOC~~ SOLN
0.0000 [IU] | Freq: Three times a day (TID) | SUBCUTANEOUS | Status: DC
  Administered 2018-09-09: 4 [IU] via SUBCUTANEOUS

## 2018-09-08 MED ORDER — IPRATROPIUM-ALBUTEROL 0.5-2.5 (3) MG/3ML IN SOLN
3.0000 mL | Freq: Two times a day (BID) | RESPIRATORY_TRACT | Status: DC
  Administered 2018-09-09: 09:00:00 3 mL via RESPIRATORY_TRACT
  Filled 2018-09-08: qty 3

## 2018-09-08 MED ORDER — POTASSIUM CHLORIDE ER 10 MEQ PO TBCR
10.0000 meq | EXTENDED_RELEASE_TABLET | Freq: Two times a day (BID) | ORAL | Status: DC
  Administered 2018-09-08 – 2018-09-09 (×2): 10 meq via ORAL
  Filled 2018-09-08 (×4): qty 1

## 2018-09-08 MED ORDER — LEVOTHYROXINE SODIUM 75 MCG PO TABS
175.0000 ug | ORAL_TABLET | Freq: Every day | ORAL | Status: DC
  Administered 2018-09-09: 175 ug via ORAL
  Filled 2018-09-08: qty 1

## 2018-09-08 MED ORDER — ATENOLOL-CHLORTHALIDONE 100-25 MG PO TABS: 1.0000 | ORAL_TABLET | Freq: Every day | ORAL | Status: DC

## 2018-09-08 MED ORDER — LOSARTAN POTASSIUM 50 MG PO TABS
100.0000 mg | ORAL_TABLET | Freq: Every day | ORAL | Status: DC
  Administered 2018-09-09: 09:00:00 100 mg via ORAL
  Filled 2018-09-08: qty 2

## 2018-09-08 MED ORDER — ROSUVASTATIN CALCIUM 20 MG PO TABS
20.0000 mg | ORAL_TABLET | Freq: Every day | ORAL | Status: DC
  Administered 2018-09-08: 20 mg via ORAL
  Filled 2018-09-08: qty 1

## 2018-09-08 MED ORDER — ISOSORBIDE MONONITRATE ER 30 MG PO TB24
30.0000 mg | ORAL_TABLET | Freq: Every day | ORAL | Status: DC
  Administered 2018-09-09: 30 mg via ORAL
  Filled 2018-09-08: qty 1

## 2018-09-08 MED ORDER — NITROGLYCERIN 0.4 MG SL SUBL: 0.4000 mg | SUBLINGUAL_TABLET | SUBLINGUAL | Status: DC | PRN

## 2018-09-08 MED ORDER — PERFLUTREN LIPID MICROSPHERE
1.0000 mL | INTRAVENOUS | Status: AC | PRN
  Administered 2018-09-08: 17:00:00 4 mL via INTRAVENOUS
  Filled 2018-09-08: qty 10

## 2018-09-08 MED ORDER — IPRATROPIUM-ALBUTEROL 0.5-2.5 (3) MG/3ML IN SOLN: 3.0000 mL | Freq: Four times a day (QID) | RESPIRATORY_TRACT | Status: DC

## 2018-09-08 MED ORDER — LINAGLIPTIN 5 MG PO TABS
5.0000 mg | ORAL_TABLET | Freq: Every day | ORAL | Status: DC
  Administered 2018-09-09: 09:00:00 5 mg via ORAL
  Filled 2018-09-08: qty 1

## 2018-09-08 MED ORDER — ATENOLOL 50 MG PO TABS
100.0000 mg | ORAL_TABLET | Freq: Every day | ORAL | Status: DC
  Administered 2018-09-09: 100 mg via ORAL
  Filled 2018-09-08: qty 2

## 2018-09-08 MED ORDER — INSULIN ASPART PROT & ASPART (70-30 MIX) 100 UNIT/ML ~~LOC~~ SUSP
20.0000 [IU] | Freq: Two times a day (BID) | SUBCUTANEOUS | Status: DC
  Filled 2018-09-08: qty 10

## 2018-09-08 MED ORDER — CHLORTHALIDONE 25 MG PO TABS
25.0000 mg | ORAL_TABLET | Freq: Every day | ORAL | Status: DC
  Administered 2018-09-09: 25 mg via ORAL
  Filled 2018-09-08: qty 1

## 2018-09-08 MED ORDER — ACETAMINOPHEN 500 MG PO TABS: 500.0000 mg | ORAL_TABLET | Freq: Four times a day (QID) | ORAL | Status: DC | PRN

## 2018-09-08 MED ORDER — RIVAROXABAN 20 MG PO TABS
20.0000 mg | ORAL_TABLET | Freq: Every day | ORAL | Status: DC
  Administered 2018-09-09: 20 mg via ORAL
  Filled 2018-09-08: qty 1

## 2018-09-08 MED ORDER — PANTOPRAZOLE SODIUM 40 MG PO TBEC
40.0000 mg | DELAYED_RELEASE_TABLET | Freq: Every day | ORAL | Status: DC
  Administered 2018-09-09: 09:00:00 40 mg via ORAL
  Filled 2018-09-08: qty 1

## 2018-09-08 MED ORDER — ONDANSETRON HCL 4 MG/2ML IJ SOLN: 4.0000 mg | Freq: Four times a day (QID) | INTRAMUSCULAR | Status: DC | PRN

## 2018-09-08 NOTE — Telephone Encounter (Signed)
Patient c/o Palpitations:  High priority if patient c/o lightheadedness, shortness of breath, or chest pain  1) How long have you had palpitations/irregular HR/ Afib? Are you having the symptoms now? Last 2 or 3 weeks, it got worse yesterday, pt had to come home from work  2) Are you currently experiencing lightheadedness, SOB or CP? Chest pains yesterday and chest feels tight this morning  3) Do you have a history of afib (atrial fibrillation) or irregular heart rhythm? Afib  4) Have you checked your BP or HR? (document readings if available):128/? And pulse rate   Was 60   5) Are you experiencing any other symptoms?

## 2018-09-08 NOTE — ED Provider Notes (Signed)
[facility] EMERGENCY DEPARTMENT Provider Note   CSN: [id number] Arrival date & time: 09/08/18  8921    History   Chief Complaint Chief Complaint  Patient presents with  . Chest Pain    HPI Anthony Woods is a 63 y.o. male with past medical history of CAD with 2 stents, A. fib, type 2 diabetes, hyperlipidemia, hypertension, presenting to the emergency department with complaint of 2 days of constant generalized chest tightness with associated shortness of breath.  He states his gums worsen when walking around/exerting himself, especially the shortness of breath.  He states he has a chronic cough due to smoking, however it is becoming more productive of a green phlegm.  He endorses fatigue and headache as well.  He denies fever or sick contacts.  He states his previous heart attacks were much more severe, and his current symptoms are not similar.  Chest tightness is rated as a 5/10 severity, he did not treat with nitro because he stated it was not that bad.     The history is provided by the patient.    Past Medical History:  Diagnosis Date  . Allergy to bee sting   . Anxiety   . Arthritis    "right knee" (05/12/2016)  . Atrial fibrillation [facility])    has 2 stents feb 2018 and June 2019  . CAD (coronary artery disease)   . Headache    "when his sugar goes too high or too low" (05/12/2016)  . HTN (hypertension)   . Hyperlipidemia   . Hypothyroidism   . Macular degeneration   . Obesity   . Type II diabetes mellitus [facility])     Patient Active Problem List   Diagnosis Date Noted  . Wrist pain, acute, left 04/24/2018  . Coronary artery disease involving native coronary artery of native heart with unstable angina pectoris ([place])   . Stress at work 12/15/2016  . Tobacco dependence 06/04/2016  . Atrial fibrillation with RVR ([place]) 05/12/2016  . Elevated troponin   . Atrial fibrillation ([place]) 05/11/2016  . Allergic rhinitis 08/12/2015  . Chest pain with moderate  risk of acute coronary syndrome 04/14/2015  . Urinary frequency 04/14/2015  . Osteoarthritis of right knee 12/09/2014  . Snoring 09/25/2013  . Rash and nonspecific skin eruption 08/08/2012  . Insomnia 08/08/2012  . Morbid obesity ([place]) 06/10/2011  . Well adult exam 06/10/2011  . Cough due to angiotensin-converting enzyme inhibitor 10/08/2010  . SHOULDER PAIN 03/09/2010  . NECK PAIN 03/09/2010  . TOBACCO USER 06/05/2009  . BRONCHITIS, ACUTE 12/13/2007  . Anaphylactic reaction to bee sting 08/02/2007  . Hypothyroidism 04/04/2007  . Type II diabetes mellitus, uncontrolled ([place]) 04/04/2007  . Essential hypertension 04/04/2007  . Coronary atherosclerosis 04/04/2007  . Edema 04/04/2007    Past Surgical History:  Procedure Laterality Date  . CARDIAC CATHETERIZATION    . COLONOSCOPY    . CORONARY ANGIOPLASTY WITH STENT PLACEMENT  05/12/2016  . CORONARY STENT INTERVENTION N/A 05/12/2016   Procedure: Coronary Stent Intervention;  Surgeon: Jettie Booze, MD;  Location: [place] CV LAB;  Service: Cardiovascular;  Laterality: N/A;  . CORONARY STENT INTERVENTION N/A 08/31/2017   Procedure: CORONARY STENT INTERVENTION;  Surgeon: Jettie Booze, MD;  Location: [place] CV LAB;  Service: Cardiovascular;  Laterality: N/A;  . GANGLION CYST EXCISION Left 1980s  . KNEE ARTHROSCOPY Right    Meniscus removal  . LEFT HEART CATH AND CORONARY ANGIOGRAPHY N/A 05/12/2016   Procedure: Left Heart Cath  and Coronary Angiography;  Surgeon: Jettie Booze, MD;  Location: [place] CV LAB;  Service: Cardiovascular;  Laterality: N/A;  . LEFT HEART CATH AND CORONARY ANGIOGRAPHY N/A 08/31/2017   Procedure: LEFT HEART CATH AND CORONARY ANGIOGRAPHY;  Surgeon: Jettie Booze, MD;  Location: [place] CV LAB;  Service: Cardiovascular;  Laterality: N/A;  . LEFT HEART CATHETERIZATION WITH CORONARY ANGIOGRAM N/A 02/04/2012   Procedure: LEFT HEART CATHETERIZATION WITH CORONARY ANGIOGRAM;   Surgeon: Burnell Blanks, MD;  Location: [facility] CATH LAB;  Service: Cardiovascular;  Laterality: N/A;  . POLYPECTOMY          Home Medications    Prior to Admission medications   Medication Sig Start Date End Date Taking? Authorizing Provider  acetaminophen (TYLENOL) 500 MG tablet Take 1 tablet (500 mg total) by mouth every 6 (six) hours as needed. 04/21/18  Yes Wurst, [place], PA-C  aspirin EC 81 MG tablet Take 81 mg by mouth daily.   Yes [provider]  atenolol-chlorthalidone (TENORETIC) 100-25 MG tablet TAKE 1 TABLET BY MOUTH DAILY. 06/27/18  Yes Hoyt Koch, MD  clopidogrel (PLAVIX) 75 MG tablet Take 75 mg by mouth daily.   Yes [provider]  diphenhydrAMINE (BENADRYL) 25 mg capsule Take 25 mg by mouth as needed (bee stings).   Yes [provider]  EPINEPHRINE 0.3 mg/0.3 mL IJ SOAJ injection INJECT 0.3 MLS (0.3 MG TOTAL) INTO THE MUSCLE ONCE. Patient taking differently: Inject 0.3 mg into the muscle as needed for anaphylaxis.  06/15/18  Yes Plotnikov, Evie Lacks, MD  escitalopram (LEXAPRO) 10 MG tablet Take 1 tablet (10 mg total) by mouth daily. 12/20/17  Yes Plotnikov, Evie Lacks, MD  insulin lispro protamine-lispro (HUMALOG 75/25 MIX) (75-25) 100 UNIT/ML SUSP injection Inject 60 Units into the skin daily.   Yes [provider]  Insulin Pen Needle (ULTICARE MICRO PEN NEEDLES) 32G X 4 MM MISC use with insulin pen to inject insulin 2x daily. 11/19/16  Yes Renato Shin, MD  isosorbide mononitrate (IMDUR) 30 MG 24 hr tablet TAKE 1 TABLET BY MOUTH DAILY. Patient taking differently: Take 30 mg by mouth daily. ** DO NOT CRUSH ** 06/19/18  Yes Rosita Fire, Brittainy M, PA-C  levothyroxine (SYNTHROID, LEVOTHROID) 175 MCG tablet TAKE 1 TABLET BY MOUTH DAILY. Patient taking differently: Take 175 mcg by mouth daily before breakfast.  01/11/18  Yes Plotnikov, Evie Lacks, MD  linagliptin (TRADJENTA) 5 MG TABS tablet Take 1 tablet (5 mg total) by mouth daily.  08/12/15  Yes Plotnikov, Evie Lacks, MD  losartan (COZAAR) 100 MG tablet TAKE 1 TABLET BY MOUTH DAILY. Patient taking differently: Take 100 mg by mouth daily.  12/19/17  Yes Plotnikov, Evie Lacks, MD  metFORMIN (GLUCOPHAGE) 1000 MG tablet Take 1 tablet (1,000 mg total) by mouth 2 (two) times daily with a meal. 08/12/15  Yes Plotnikov, Evie Lacks, MD  OZEMPIC, 0.25 OR 0.5 MG/DOSE, 2 MG/1.5ML SOPN Inject 0.5 mg into the skin every Monday.  12/08/17  Yes [provider]  pantoprazole (PROTONIX) 40 MG tablet TAKE 1 TABLET (40 MG TOTAL) BY MOUTH DAILY. 06/18/18  Yes Plotnikov, Evie Lacks, MD  potassium chloride (K-DUR) 10 MEQ tablet TAKE 1 TABLET BY MOUTH 2 TIMES DAILY. Patient taking differently: Take 10 mEq by mouth 2 (two) times daily.  04/14/18  Yes Plotnikov, Evie Lacks, MD  predniSONE (DELTASONE) 10 MG tablet Take 10 mg by mouth daily as needed (if stung by bee).   Yes [provider]  rosuvastatin (CRESTOR)  20 MG tablet TAKE 1 TABLET BY MOUTH AT BEDTIME. Patient taking differently: Take 20 mg by mouth at bedtime.  11/10/17  Yes Cheryln Manly, NP  XARELTO 20 MG TABS tablet TAKE 1 TABLET BY MOUTH DAILY. Patient taking differently: Take 20 mg by mouth daily.  12/19/17  Yes Plotnikov, Evie Lacks, MD    Family History Family History  Problem Relation Age of Onset  . Diabetes Father   . Cancer Father        stomach  . CVA Father   . Stomach cancer Father   . Diabetes Mother   . Cancer Mother        male ca  . Colon cancer Mother 75  . Hypertension Other   . Diabetes Other   . CAD Maternal Grandmother   . Esophageal cancer Neg Hx   . Rectal cancer Neg Hx     Social History Social History   Tobacco Use  . Smoking status: Current Every Day Smoker    Packs/day: 1.50    Years: 42.00    Pack years: 63.00    Types: Cigarettes  . Smokeless tobacco: Never Used  Substance Use Topics  . Alcohol use: No  . Drug use: No     Allergies   Bee venom, Actos [pioglitazone  hydrochloride], Lipitor [atorvastatin], and Lisinopril   Review of Systems Review of Systems  Constitutional: Positive for diaphoresis. Negative for fever.  Respiratory: Positive for cough and shortness of breath.   Cardiovascular: Positive for chest pain and palpitations.  Gastrointestinal: Negative for abdominal pain and nausea.  All other systems reviewed and are negative.    Physical Exam Updated Vital Signs BP 106/61   Pulse (!) 57   Temp 98.1 F (36.7 C) (Oral)   Resp 17   Ht 5\' 5"  (1.651 m)   Wt 108.9 kg   SpO2 93%   BMI 39.94 kg/m   Physical Exam Vitals signs and nursing note reviewed.  Constitutional:      General: He is not in acute distress.    Appearance: He is well-developed. He is obese.  HENT:     Head: Normocephalic and atraumatic.  Eyes:     Conjunctiva/sclera: Conjunctivae normal.  Neck:     Musculoskeletal: Normal range of motion and neck supple.  Cardiovascular:     Rate and Rhythm: Normal rate and regular rhythm.  Pulmonary:     Effort: Pulmonary effort is normal. No respiratory distress.     Breath sounds: Rhonchi present.  Chest:     Chest wall: No tenderness.  Abdominal:     General: Bowel sounds are normal.     Palpations: Abdomen is soft.     Tenderness: There is no abdominal tenderness. There is no guarding or rebound.  Musculoskeletal:     Right lower leg: No edema.     Left lower leg: No edema.  Skin:    General: Skin is warm.  Neurological:     Mental Status: He is alert.  Psychiatric:        Behavior: Behavior normal.      ED Treatments / Results  Labs (all labs ordered are listed, but only abnormal results are displayed) Labs Reviewed  BASIC METABOLIC PANEL - Abnormal; Notable for the following components:      Result Value   Sodium 134 (*)    Chloride 97 (*)    Glucose, Bld 234 (*)    All other components within normal limits  BRAIN NATRIURETIC PEPTIDE -  Abnormal; Notable for the following components:   B  Natriuretic Peptide 141.1 (*)    All other components within normal limits  SARS CORONAVIRUS 2  CBC WITH DIFFERENTIAL/PLATELET  TROPONIN I    EKG EKG Interpretation  Date/Time:  Friday September 08 2018 09:21:05 EDT Ventricular Rate:  64 PR Interval:    QRS Duration: 95 QT Interval:  408 QTC Calculation: 421 R Axis:   -38 Text Interpretation:  Sinus rhythm Multiform ventricular premature complexes Left axis deviation Low voltage, precordial leads Baseline wander ST-t wave abnormality Abnormal ECG Confirmed by Carmin Muskrat [phone]) on 09/08/2018 10:06:40 AM   Radiology Dg Chest Port 1 View  Result Date: 09/08/2018 CLINICAL DATA:  Generalized chest tightness for the past 2 days while at rest. EXAM: PORTABLE CHEST 1 VIEW COMPARISON:  05/02/2018. FINDINGS: Normal sized heart. Clear lungs. Stable mild peribronchial thickening. Aortic arch calcification. Unremarkable bones. IMPRESSION: No acute abnormality. Stable mild chronic bronchitic changes. Electronically Signed   By: Claudie Revering M.D.   On: 09/08/2018 10:07    Procedures Procedures (including critical care time)  Medications Ordered in ED Medications  aspirin chewable tablet 162 mg (has no administration in time range)     Initial Impression / Assessment and Plan / ED Course  I have reviewed the triage vital signs and the nursing notes.  Pertinent labs & imaging results that were available during my care of the patient were reviewed by me and considered in my medical decision making (see chart for details).  Clinical Course as of Sep 07 1200  Fri Sep 08, 2018  1107 Consult placed to hospitalist, accepting admission.  Will do rapid COVID screening as patient is complaining of worsening cough from baseline.   [JR]    Clinical Course User Index [JR] Noor Vidales, [place] N, PA-C       Patient with history of CAD status post 2 stents placement, most recently June 2019, presenting with 2 days of constant chest tightness and  shortness of breath that are worse with exertion, better at rest.  He does endorse some increase in his usual cough with some more mucus production, however no fever or sick contacts.  Unsure if patient is still taking aspirin, he reported to me he was taken off of it, however per pharmacy tech patient reports last dose of aspirin was 81 mg this morning.  Will dose with 162 mg today.  Initial EKG without change from baseline.  Initial troponin is negative.  Chest x-ray is negative.  Given patient's increase in cough and mucus production, will send rapid COVID test and admit to medicine for chest pain rule out.  The patient appears reasonably stabilized for admission considering the current resources, flow, and capabilities available in the ED at this time, and I doubt any other [facility] requiring further screening and/or treatment in the ED prior to admission.   Final Clinical Impressions(s) / ED Diagnoses   Final diagnoses:  Chest tightness  Shortness of breath    ED Discharge Orders    None       Primitivo Merkey, [place] N, PA-C 09/08/18 1202    Carmin Muskrat, MD 09/08/18 1513

## 2018-09-08 NOTE — ED Triage Notes (Signed)
Pt here for evaluation of generalized chest tightness onset 2 days ago while at rest. Endorses dizziness, headache, shortness of breath, and fatigue. Hx 2 stents.

## 2018-09-08 NOTE — ED Notes (Signed)
ED TO INPATIENT HANDOFF REPORT  ED Nurse Name and Phone #: Harriette Bouillon [phone]  S Name/Age/Gender Anthony Woods 63 y.o. male Room/Bed: 014C/014C  Code Status   Code Status: Full Code  Home/SNF/Other Home Patient oriented to: self, place, time and situation Is this baseline? Yes   Triage Complete: Triage complete  Chief Complaint pressure on heart   Triage Note Pt here for evaluation of generalized chest tightness onset 2 days ago while at rest. Endorses dizziness, headache, shortness of breath, and fatigue. Hx 2 stents.    Allergies Allergies  Allergen Reactions  . Bee Venom Anaphylaxis  . Actos [Pioglitazone Hydrochloride] Swelling  . Lipitor [Atorvastatin] Other (See Comments)    Patient cannot recall reaction  . Lisinopril Other (See Comments)    cough    Level of Care/Admitting Diagnosis ED Disposition    ED Disposition Condition Comment   Admit  Hospital Area: [facility] [[id number]]  Level of Care: Telemetry Medical [104]  I expect the patient will be discharged within 24 hours: Yes  LOW acuity---Tx typically complete <24 hrs---ACUTE conditions typically can be evaluated <24 hours---LABS likely to return to acceptable levels <24 hours---IS near functional baseline---EXPECTED to return to current living arrangement---NOT newly hypoxic: Meets criteria for 5C-Observation unit  Covid Evaluation: Confirmed COVID Negative  Diagnosis: Chest pain [[id number]]  Admitting Physician: Guilford Shi [[id number]]  Attending Physician: Guilford Shi [[id number]]  PT Class (Do Not Modify): Observation [104]  PT Acc Code (Do Not Modify): Observation [10022]       B Medical/Surgery History Past Medical History:  Diagnosis Date  . Allergy to bee sting   . Anxiety   . Arthritis    "right knee" (05/12/2016)  . Atrial fibrillation [facility])    has 2 stents feb 2018 and June 2019  . CAD (coronary artery disease)   . Headache    "when his sugar goes too high or  too low" (05/12/2016)  . HTN (hypertension)   . Hyperlipidemia   . Hypothyroidism   . Macular degeneration   . Obesity   . Type II diabetes mellitus ([place])    Past Surgical History:  Procedure Laterality Date  . CARDIAC CATHETERIZATION    . COLONOSCOPY    . CORONARY ANGIOPLASTY WITH STENT PLACEMENT  05/12/2016  . CORONARY STENT INTERVENTION N/A 05/12/2016   Procedure: Coronary Stent Intervention;  Surgeon: Jettie Booze, MD;  Location: [place] [facility] LAB;  Service: Cardiovascular;  Laterality: N/A;  . CORONARY STENT INTERVENTION N/A 08/31/2017   Procedure: CORONARY STENT INTERVENTION;  Surgeon: Jettie Booze, MD;  Location: [place] [facility];  Service: Cardiovascular;  Laterality: N/A;  . GANGLION CYST EXCISION Left 1980s  . KNEE ARTHROSCOPY Right    Meniscus removal  . LEFT HEART CATH AND CORONARY ANGIOGRAPHY N/A 05/12/2016   Procedure: Left Heart Cath and Coronary Angiography;  Surgeon: Jettie Booze, MD;  Location: [place] [facility] LAB;  Service: Cardiovascular;  Laterality: N/A;  . LEFT HEART CATH AND CORONARY ANGIOGRAPHY N/A 08/31/2017   Procedure: LEFT HEART CATH AND CORONARY ANGIOGRAPHY;  Surgeon: Jettie Booze, MD;  Location: [place] [facility] LAB;  Service: Cardiovascular;  Laterality: N/A;  . LEFT HEART CATHETERIZATION WITH CORONARY ANGIOGRAM N/A 02/04/2012   Procedure: LEFT HEART CATHETERIZATION WITH CORONARY ANGIOGRAM;  Surgeon: Burnell Blanks, MD;  Location: [facility] CATH LAB;  Service: Cardiovascular;  Laterality: N/A;  . POLYPECTOMY       A IV Location/Drains/Wounds Patient Lines/Drains/Airways Status   Active  Line/Drains/Airways    Name:   Placement date:   Placement time:   Site:   Days:   Peripheral IV 09/08/18 Right Antecubital   09/08/18    1108    Antecubital   less than 1          Intake/Output Last 24 hours No intake or output data in the 24 hours ending 09/08/18 1422  Labs/Imaging Results for orders placed or performed during the  hospital encounter of 09/08/18 (from the past 48 hour(s))  CBC with Differential     Status: None   Collection Time: 09/08/18  9:31 AM  Result Value Ref Range   WBC 9.0 4.0 - 10.5 K/uL   RBC 5.31 4.22 - 5.81 MIL/uL   Hemoglobin 16.3 13.0 - 17.0 g/dL   HCT 48.7 39.0 - 52.0 %   MCV 91.7 80.0 - 100.0 fL   MCH 30.7 26.0 - 34.0 pg   MCHC 33.5 30.0 - 36.0 g/dL   RDW 12.2 11.5 - 15.5 %   Platelets 181 150 - 400 K/uL   nRBC 0.0 0.0 - 0.2 %   Neutrophils Relative % 68 %   Neutro Abs 6.1 1.7 - 7.7 K/uL   Lymphocytes Relative 22 %   Lymphs Abs 2.0 0.7 - 4.0 K/uL   Monocytes Relative 7 %   Monocytes Absolute 0.6 0.1 - 1.0 K/uL   Eosinophils Relative 2 %   Eosinophils Absolute 0.2 0.0 - 0.5 K/uL   Basophils Relative 1 %   Basophils Absolute 0.1 0.0 - 0.1 K/uL   Immature Granulocytes 0 %   Abs Immature Granulocytes 0.04 0.00 - 0.07 K/uL    Comment: Performed at [facility], [address], [place], [place] [address]  Basic metabolic panel     Status: Abnormal   Collection Time: 09/08/18  9:31 AM  Result Value Ref Range   Sodium 134 (L) 135 - 145 mmol/L   Potassium 3.6 3.5 - 5.1 mmol/L   Chloride 97 (L) 98 - 111 mmol/L   CO2 27 22 - 32 mmol/L   Glucose, Bld 234 (H) 70 - 99 mg/dL   BUN 9 8 - 23 mg/dL   Creatinine, Ser 1.04 0.61 - 1.24 mg/dL   Calcium 9.7 8.9 - 10.3 mg/dL   GFR calc non Af Amer >60 >60 mL/min   GFR calc Af Amer >60 >60 mL/min   Anion gap 10 5 - 15    Comment: Performed at [facility], [place] [address], [place], [place] [address]  Troponin I - Once     Status: None   Collection Time: 09/08/18  9:31 AM  Result Value Ref Range   Troponin I <0.03 <0.03 ng/mL    Comment: Performed at [facility] [place] [address], [place], [place] [address]  Brain natriuretic peptide     Status: Abnormal   Collection Time: 09/08/18  9:31 AM  Result Value Ref Range   B Natriuretic Peptide 141.1 (H) 0.0 - 100.0 pg/mL    Comment: Performed at [facility] [address], [place], [place] [address]  SARS Coronavirus 2     Status: None   Collection Time: 09/08/18 11:10 AM  Result Value Ref Range   SARS Coronavirus 2 NOT DETECTED NOT DETECTED    Comment: (NOTE) SARS-CoV-2 target nucleic acids are NOT DETECTED. The SARS-CoV-2 RNA is generally detectable in upper and lower respiratory specimens during the acute phase of infection.  Negative  results  do not preclude SARS-CoV-2 infection, do not rule out co-infections with other pathogens, and should not be used as the sole basis for treatment or other patient management decisions.  Negative results must be combined with clinical observations, patient history, and epidemiological information. The expected result is Not Detected. Fact Sheet for Patients: http://www.biofiredefense.com/wp-content/uploads/2020/03/BIOFIRE-COVID -19-patients.pdf Fact Sheet for Healthcare Providers: http://www.biofiredefense.com/wp-content/uploads/2020/03/BIOFIRE-COVID -19-hcp.pdf This test is not yet approved or cleared by the [place] and  has been authorized for detection and/or diagnosis of SARS-CoV-2 by FDA under an Emergency Use Authorization (EUA).  This EUA will remain in effec t (meaning this test can be used) for the duration of  the COVID-19 declaration under Section 564(b)(1) of the Act, 21 U.S.C. section 360bbb-3(b)(1), unless the authorization is terminated or revoked sooner. Performed at [facility], [place] [address] [place], [place] [address]    Dg Chest Port 1 View  Result Date: 09/08/2018 CLINICAL DATA:  Generalized chest tightness for the past 2 days while at rest. EXAM: PORTABLE CHEST 1 VIEW COMPARISON:  05/02/2018. FINDINGS: Normal sized heart. Clear lungs. Stable mild peribronchial thickening. Aortic arch calcification. Unremarkable bones. IMPRESSION: No acute abnormality. Stable mild chronic bronchitic changes. Electronically Signed   By: Claudie Revering M.D.   On: 09/08/2018 10:07     Pending Labs Unresulted Labs (From admission, onward)    Start     Ordered   09/08/18 1413  Troponin I - Now Then Q6H  Now then every 6 hours,   R (with STAT occurrences)     09/08/18 1414   09/08/18 1410  HIV antibody (Routine Testing)  Once,   STAT     09/08/18 1414          Vitals/Pain Today's Vitals   09/08/18 1208 09/08/18 1215 09/08/18 1300 09/08/18 1330  BP:  (!) 104/56 107/60 114/66  Pulse:  (!) 57 (!) 56 (!) 57  Resp:  16  18  Temp:      TempSrc:      SpO2:  95% 93% 94%  Weight:      Height:      PainSc: 5        Isolation Precautions Droplet and Contact precautions  Medications Medications  acetaminophen (TYLENOL) tablet 500 mg (has no administration in time range)  atenolol-chlorthalidone (TENORETIC) 100-25 MG per tablet 1 tablet (has no administration in time range)  isosorbide mononitrate (IMDUR) 24 hr tablet 30 mg (has no administration in time range)  losartan (COZAAR) tablet 100 mg (has no administration in time range)  rosuvastatin (CRESTOR) tablet 20 mg (has no administration in time range)  escitalopram (LEXAPRO) tablet 10 mg (has no administration in time range)  levothyroxine (SYNTHROID) tablet 175 mcg (has no administration in time range)  insulin aspart protamine- aspart (NOVOLOG MIX 70/30) injection 20 Units (has no administration in time range)  linagliptin (TRADJENTA) tablet 5 mg (has no administration in time range)  pantoprazole (PROTONIX) EC tablet 40 mg (has no administration in time range)  clopidogrel (PLAVIX) tablet 75 mg (has no administration in time range)  rivaroxaban (XARELTO) tablet 20 mg (has no administration in time range)  potassium chloride (K-DUR) CR tablet 10 mEq (has no administration in time range)  ondansetron (ZOFRAN) tablet 4 mg (has no administration in time range)    Or  ondansetron (ZOFRAN) injection 4 mg (has no administration in time range)  nitroGLYCERIN (NITROSTAT) SL tablet 0.4 mg (has no administration in  time range)  ipratropium-albuterol (DUONEB) 0.5-2.5 (3) MG/3ML nebulizer  solution 3 mL (has no administration in time range)  Ipratropium-Albuterol (COMBIVENT) respimat 1 puff (has no administration in time range)  aspirin chewable tablet 162 mg (162 mg Oral Given 09/08/18 1207)    Mobility walks Low fall risk   Focused Assessments Cardiac Assessment Handoff:  Cardiac Rhythm: Normal sinus rhythm Lab Results  Component Value Date   TROPONINI <0.03 09/08/2018   No results found for: DDIMER Does the Patient currently have chest pain? No     R Recommendations: See Admitting Provider Note  Report given to:   Additional Notes:

## 2018-09-08 NOTE — Telephone Encounter (Signed)
Spoke to pt and wife on speaker phone. Pt reports chest pain/tightness for the past 3 days.  It comes and goes, but is worse/constant w/ exertion. Also reports SOB, diaphoresis While on phone pt reports chest pain starting again. Wife states that yesterday she took him to local fire dept but they were out on a call, pt refused to let her call 911 or take him to the hospital. Given pt hx CAD and PCI w/ mild dz in LAD/RCA pt advised to go to ED for evaluation.   Wife will drive pt. Wife & pt agreeable to go to [facility] for evaluation.

## 2018-09-08 NOTE — ED Notes (Signed)
Pt  in nasal canula 2L/min

## 2018-09-08 NOTE — Progress Notes (Signed)
  Echocardiogram 2D Echocardiogram has been performed.  Anthony Woods 09/08/2018, 4:46 PM

## 2018-09-08 NOTE — ED Notes (Signed)
Nurse navigator spoke with patient and then wife who is waiting in the parking lot. Stated he will be admitted and she said she will leave and go home.

## 2018-09-08 NOTE — Plan of Care (Signed)

## 2018-09-08 NOTE — H&P (Addendum)
History and Physical    DOA: 09/08/2018  PCP: Cassandria Anger, MD  Patient coming from: Home  Chief Complaint: Chest tightness  HPI: Anthony Woods is a 63 y.o. male with history h/o CAD with 2 stents, A. fib, type 2 diabetes, hyperlipidemia, hypertension, presenting to the emergency department with complaints of chest tightness and exertional dyspnea for last 2 weeks.  He states he works at [employer] and works outdoors, walks a lot.  He states exertional dyspnea got worse today to the point that he could not walk more than 20 feet without shortness of breath.  Denies any symptoms at rest.  Denies any leg swellings or orthopnea or PND.  He does admit to smoking 2 packs/day.  Denies any cough, fever or wheezing.  He denies any known contact with COVID positive persons but his job does involve burying dead bodies.  His last stent was in June 2019 and he currently takes Plavix in addition to chronic anticoagulation with Xarelto.  He states he no longer takes aspirin.  He does not believe he had a stress test since his last stent.  He admits to diet noncompliance but reports taking medications on time.  Dr. Lauree Chandler for cardiology.   Review of Systems: As per HPI otherwise 10 point review of systems negative.    Past Medical History:  Diagnosis Date  . Allergy to bee sting   . Anxiety   . Arthritis    "right knee" (05/12/2016)  . Atrial fibrillation [facility])    has 2 stents feb 2018 and June 2019  . CAD (coronary artery disease)   . Headache    "when his sugar goes too high or too low" (05/12/2016)  . HTN (hypertension)   . Hyperlipidemia   . Hypothyroidism   . Macular degeneration   . Obesity   . Type II diabetes mellitus ([place])     Past Surgical History:  Procedure Laterality Date  . CARDIAC CATHETERIZATION    . COLONOSCOPY    . CORONARY ANGIOPLASTY WITH STENT PLACEMENT  05/12/2016  . CORONARY STENT INTERVENTION N/A 05/12/2016   Procedure: Coronary Stent  Intervention;  Surgeon: Jettie Booze, MD;  Location: [place] CV LAB;  Service: Cardiovascular;  Laterality: N/A;  . CORONARY STENT INTERVENTION N/A 08/31/2017   Procedure: CORONARY STENT INTERVENTION;  Surgeon: Jettie Booze, MD;  Location: [place] [facility] LAB;  Service: Cardiovascular;  Laterality: N/A;  . GANGLION CYST EXCISION Left 1980s  . KNEE ARTHROSCOPY Right    Meniscus removal  . LEFT HEART CATH AND CORONARY ANGIOGRAPHY N/A 05/12/2016   Procedure: Left Heart Cath and Coronary Angiography;  Surgeon: Jettie Booze, MD;  Location: [place] [facility];  Service: Cardiovascular;  Laterality: N/A;  . LEFT HEART CATH AND CORONARY ANGIOGRAPHY N/A 08/31/2017   Procedure: LEFT HEART CATH AND CORONARY ANGIOGRAPHY;  Surgeon: Jettie Booze, MD;  Location: [place] [facility] LAB;  Service: Cardiovascular;  Laterality: N/A;  . LEFT HEART CATHETERIZATION WITH CORONARY ANGIOGRAM N/A 02/04/2012   Procedure: LEFT HEART CATHETERIZATION WITH CORONARY ANGIOGRAM;  Surgeon: Burnell Blanks, MD;  Location: [facility] C[facility] LAB;  Service: Cardiovascular;  Laterality: N/A;  . POLYPECTOMY      Social history:  reports that he has been smoking cigarettes. He has a 63.00 pack-year smoking history. He has never used smokeless tobacco. He reports that he does not drink alcohol or use drugs.   Allergies  Allergen Reactions  . Bee Venom Anaphylaxis  . Actos [  Pioglitazone Hydrochloride] Swelling  . Lipitor [Atorvastatin] Other (See Comments)    Patient cannot recall reaction  . Lisinopril Other (See Comments)    cough    Family History  Problem Relation Age of Onset  . Diabetes Father   . Cancer Father        stomach  . CVA Father   . Stomach cancer Father   . Diabetes Mother   . Cancer Mother        male ca  . Colon cancer Mother 69  . Hypertension Other   . Diabetes Other   . CAD Maternal Grandmother   . Esophageal cancer Neg Hx   . Rectal cancer Neg Hx       Prior to  Admission medications   Medication Sig Start Date End Date Taking? Authorizing Provider  acetaminophen (TYLENOL) 500 MG tablet Take 1 tablet (500 mg total) by mouth every 6 (six) hours as needed. 04/21/18  Yes Wurst, [place], PA-C  aspirin EC 81 MG tablet Take 81 mg by mouth daily.   Yes [provider]  atenolol-chlorthalidone (TENORETIC) 100-25 MG tablet TAKE 1 TABLET BY MOUTH DAILY. 06/27/18  Yes Hoyt Koch, MD  clopidogrel (PLAVIX) 75 MG tablet Take 75 mg by mouth daily.   Yes [provider]  diphenhydrAMINE (BENADRYL) 25 mg capsule Take 25 mg by mouth as needed (bee stings).   Yes [provider]  EPINEPHRINE 0.3 mg/0.3 mL IJ SOAJ injection INJECT 0.3 MLS (0.3 MG TOTAL) INTO THE MUSCLE ONCE. Patient taking differently: Inject 0.3 mg into the muscle as needed for anaphylaxis.  06/15/18  Yes Plotnikov, Evie Lacks, MD  escitalopram (LEXAPRO) 10 MG tablet Take 1 tablet (10 mg total) by mouth daily. 12/20/17  Yes Plotnikov, Evie Lacks, MD  insulin lispro protamine-lispro (HUMALOG 75/25 MIX) (75-25) 100 UNIT/ML SUSP injection Inject 60 Units into the skin daily.   Yes [provider]  Insulin Pen Needle (ULTICARE MICRO PEN NEEDLES) 32G X 4 MM MISC use with insulin pen to inject insulin 2x daily. 11/19/16  Yes Renato Shin, MD  isosorbide mononitrate (IMDUR) 30 MG 24 hr tablet TAKE 1 TABLET BY MOUTH DAILY. Patient taking differently: Take 30 mg by mouth daily. ** DO NOT CRUSH ** 06/19/18  Yes Rosita Fire, Brittainy M, PA-C  levothyroxine (SYNTHROID, LEVOTHROID) 175 MCG tablet TAKE 1 TABLET BY MOUTH DAILY. Patient taking differently: Take 175 mcg by mouth daily before breakfast.  01/11/18  Yes Plotnikov, Evie Lacks, MD  linagliptin (TRADJENTA) 5 MG TABS tablet Take 1 tablet (5 mg total) by mouth daily. 08/12/15  Yes Plotnikov, Evie Lacks, MD  losartan (COZAAR) 100 MG tablet TAKE 1 TABLET BY MOUTH DAILY. Patient taking differently: Take 100 mg by mouth daily.  12/19/17   Yes Plotnikov, Evie Lacks, MD  metFORMIN (GLUCOPHAGE) 1000 MG tablet Take 1 tablet (1,000 mg total) by mouth 2 (two) times daily with a meal. 08/12/15  Yes Plotnikov, Evie Lacks, MD  OZEMPIC, 0.25 OR 0.5 MG/DOSE, 2 MG/1.5ML SOPN Inject 0.5 mg into the skin every Monday.  12/08/17  Yes [provider]  pantoprazole (PROTONIX) 40 MG tablet TAKE 1 TABLET (40 MG TOTAL) BY MOUTH DAILY. 06/18/18  Yes Plotnikov, Evie Lacks, MD  potassium chloride (K-DUR) 10 MEQ tablet TAKE 1 TABLET BY MOUTH 2 TIMES DAILY. Patient taking differently: Take 10 mEq by mouth 2 (two) times daily.  04/14/18  Yes Plotnikov, Evie Lacks, MD  predniSONE (DELTASONE) 10 MG tablet Take 10 mg by mouth daily as  needed (if stung by bee).   Yes [provider]  rosuvastatin (CRESTOR) 20 MG tablet TAKE 1 TABLET BY MOUTH AT BEDTIME. Patient taking differently: Take 20 mg by mouth at bedtime.  11/10/17  Yes Cheryln Manly, NP  XARELTO 20 MG TABS tablet TAKE 1 TABLET BY MOUTH DAILY. Patient taking differently: Take 20 mg by mouth daily.  12/19/17  Yes Plotnikov, Evie Lacks, MD    Physical Exam:  Vitals:   09/08/18 1145 09/08/18 1200 09/08/18 1215 09/08/18 1300  BP: 106/61 108/60 (!) 104/56 107/60  Pulse: (!) 57 (!) 56 (!) 57 (!) 56  Resp: 17 17 16    Temp:      TempSrc:      SpO2: 93% 95% 95% 93%  Weight:      Height:        Constitutional: NAD, calm, comfortable Eyes: PERRL, lids and conjunctivae normal ENMT: Mucous membranes are moist. Posterior pharynx clear of any exudate or lesions.Normal dentition.  Neck: normal, supple, no masses, no thyromegaly Respiratory: clear to auscultation bilaterally, no wheezing, no crackles. Normal respiratory effort. No accessory muscle use.  Cardiovascular: Regular rate and rhythm, no murmurs / rubs / gallops. No extremity edema. 2+ pedal pulses. No carotid bruits.  Abdomen: Obese, no tenderness, no masses palpated. No hepatosplenomegaly. Bowel sounds positive.  Musculoskeletal: no  clubbing / cyanosis. No joint deformity upper and lower extremities. Good ROM, no contractures. Normal muscle tone.  Neurologic: CN 2-12 grossly intact. Sensation intact, DTR normal. Strength 5/5 in all 4.  Psychiatric: Normal judgment and insight. Alert and oriented x 3. Normal mood.  SKIN/catheters: no rashes, lesions, ulcers. No induration  Labs on Admission: I have personally reviewed following labs and imaging studies  CBC: Recent Labs  Lab 09/08/18 0931  WBC 9.0  NEUTROABS 6.1  HGB 16.3  HCT 48.7  MCV 91.7  PLT 671   Basic Metabolic Panel: Recent Labs  Lab 09/08/18 0931  NA 134*  K 3.6  CL 97*  CO2 27  GLUCOSE 234*  BUN 9  CREATININE 1.04  CALCIUM 9.7   GFR: Estimated Creatinine Clearance: 83.9 mL/min (by C-G formula based on SCr of 1.04 mg/dL). Liver Function Tests: No results for input(s): AST, ALT, ALKPHOS, BILITOT, PROT, ALBUMIN in the last 168 hours. No results for input(s): LIPASE, AMYLASE in the last 168 hours. No results for input(s): AMMONIA in the last 168 hours. Coagulation Profile: No results for input(s): INR, PROTIME in the last 168 hours. Cardiac Enzymes: Recent Labs  Lab 09/08/18 0931  TROPONINI <0.03   BNP (last 3 results) No results for input(s): PROBNP in the last 8760 hours. HbA1C: No results for input(s): HGBA1C in the last 72 hours. CBG: No results for input(s): GLUCAP in the last 168 hours. Lipid Profile: No results for input(s): CHOL, HDL, LDLCALC, TRIG, CHOLHDL, LDLDIRECT in the last 72 hours. Thyroid Function Tests: No results for input(s): TSH, T4TOTAL, FREET4, T3FREE, THYROIDAB in the last 72 hours. Anemia Panel: No results for input(s): VITAMINB12, FOLATE, FERRITIN, TIBC, IRON, RETICCTPCT in the last 72 hours. Urine analysis:    Component Value Date/Time   COLORURINE YELLOW 09/14/2017 0850   APPEARANCEUR CLEAR 09/14/2017 0850   LABSPEC 1.015 09/14/2017 0850   PHURINE 7.5 09/14/2017 0850   GLUCOSEU 250 (A) 09/14/2017  0850   HGBUR NEGATIVE 09/14/2017 [place] NEGATIVE 09/14/2017 0850   BILIRUBINUR neg 06/19/2014 0936   KETONESUR NEGATIVE 09/14/2017 0850   PROTEINUR 100 (A) 05/11/2016 2204   UROBILINOGEN  0.2 09/14/2017 0850   NITRITE NEGATIVE 09/14/2017 0850   LEUKOCYTESUR NEGATIVE 09/14/2017 0850    Radiological Exams on Admission: Dg Chest Port 1 View  Result Date: 09/08/2018 CLINICAL DATA:  Generalized chest tightness for the past 2 days while at rest. EXAM: PORTABLE CHEST 1 VIEW COMPARISON:  05/02/2018. FINDINGS: Normal sized heart. Clear lungs. Stable mild peribronchial thickening. Aortic arch calcification. Unremarkable bones. IMPRESSION: No acute abnormality. Stable mild chronic bronchitic changes. Electronically Signed   By: Claudie Revering M.D.   On: 09/08/2018 10:07    EKG: Independently reviewed.  Normal sinus rhythm with nonspecific ST-t changes with PVCs.    Assessment and Plan:   1.  Chest tightness/exertional dyspnea: Pulmonary versus cardiac etiology.  Rule out acute coronary syndrome.  Will cycle cardiac enzymes.  Patient does have risk factors of CAD with PTCA a year back, hyperlipidemia, hypertension, diabetes, obesity and ongoing smoking.  Will order MDI scheduled and nebs PRN for possible underlying COPD/bronchospasm.  Chest x-ray negative except for mild peribronchial thickening.  No fever or leukocytosis or productive cough.  Call with testing resulted negative.  Patient on Plavix and Xarelto as outpatient.  No longer takes aspirin.  Nitro sublingual PRN available.  Will obtain echo.  Can consider formal cardiology evaluation if troponin abnormal or echo shows any new abnormalities.  He did have stage II diastolic dysfunction and EF 50 to 55% on last echo in 2018.  I do not see an echo from 2019 but cath report does mention preserved EF.  2.  Paroxysmal atrial fibrillation: Resume home medications including Xarelto.  Currently in sinus rhythm.  3.   Hypertension/hyperlipidemia: Resume home medications  4.  Diabetes mellitus: Patient on 75/25 insulin 60 units daily.  Given diet noncompliance at baseline, will order 70/30 insulin 20 units twice daily while here.  Resume Tradjenta.  Sliding scale insulin.  5.  Hypothyroidism: Resume home medications.  DVT prophylaxis: On anticoagulation  Code Status: Full code  Family Communication: Discussed with patient. Health care proxy would be his wife Consults called: None at this time.  [facility] could be contacted in a.m. if about testing results concerning. Admission status:  Patient admitted as observation as anticipated LOS less than 2 midnights    Guilford Shi MD [facility] Pager [phone]  If 7PM-7AM, please contact night-coverage [email or web address] Password [email or web address]  09/08/2018, 1:32 PM

## 2018-09-08 NOTE — Progress Notes (Addendum)
1524: Patient arrived to room 5W32. Will complete assessment post ECHO.  1700: Assessment completed  1814: Patient hypoglycemic. Dinner has not arrived. Graham crackers and orange juice provided. Held insulin. Will inform on call physician. Patient states he has not eaten today.  1857: Patient given additional orange juice and crackers. States he does not want to eat food here. Convinced patient to order grilled cheese and tomato soup. Physician paged, informed of holding insulin and hypoglycemia.

## 2018-09-09 DIAGNOSIS — I25118 Atherosclerotic heart disease of native coronary artery with other forms of angina pectoris: Secondary | ICD-10-CM | POA: Diagnosis not present

## 2018-09-09 DIAGNOSIS — R079 Chest pain, unspecified: Secondary | ICD-10-CM | POA: Diagnosis not present

## 2018-09-09 DIAGNOSIS — I10 Essential (primary) hypertension: Secondary | ICD-10-CM | POA: Diagnosis not present

## 2018-09-09 LAB — HIV ANTIBODY (ROUTINE TESTING W REFLEX): HIV Screen 4th Generation wRfx: NONREACTIVE

## 2018-09-09 LAB — GLUCOSE, CAPILLARY: Glucose-Capillary: 187 mg/dL — ABNORMAL HIGH (ref 70–99)

## 2018-09-09 LAB — TROPONIN I: Troponin I: <0.03 ng/mL (ref <0.03)

## 2018-09-09 MED ORDER — NITROGLYCERIN 0.4 MG SL SUBL: 0.4000 mg | SUBLINGUAL_TABLET | SUBLINGUAL | 0 refills | Status: DC | PRN

## 2018-09-09 MED ORDER — EPINEPHRINE 0.3 MG/0.3ML IJ SOAJ: 0.3000 mg | INTRAMUSCULAR | Status: DC | PRN

## 2018-09-09 NOTE — Discharge Summary (Addendum)
Physician Discharge Summary  Anthony Woods [name]:[id number] DOB: [birth year] DOA: 09/08/2018  PCP: Cassandria Anger, MD  Admit date: 09/08/2018 Discharge date: 09/09/2018  Admitted From: Home Disposition: Home  Recommendations for Outpatient Follow-up:  1. Follow up with PCP in 1 week with repeat CBC/BMP 2. Follow-up with cardiology 3. Follow up in ED if symptoms worsen or new appear   Home Health: No Equipment/Devices: None  Discharge Condition: Stable CODE STATUS: Full Diet recommendation: Heart healthy/carb modified  Brief/Interim Summary: 63 year old male with history of CAD with stents, paroxysmal A. fib, hypertension, tobacco abuse, hyperlipidemia, diabetes mellitus type 2 presented with chest tightness and exertional dyspnea.  He was kept under observation for the same.  His troponins x3 have been negative.  No current chest pain.  Echo looks okay.  He will be discharged home with close outpatient follow-up with cardiology.  Discharge Diagnoses:  Active Problems:   Chest pain  Chest pain/tightness with exertional dyspnea in a patient with CAD status post stents -Chest x-ray showed no acute abnormality.  EKG unremarkable.  Troponins x3 have been negative.  Echo showed EF of 60 to 65%.   -No current chest pain. -Feels better and wants to go home -Spoke with Dr. Hilty/cardiology on phone and he will arrange for close outpatient follow-up with cardiology.  He was okay for the patient to be discharged. -Continue Plavix, statin and beta-blocker.  As per last cardiology note, he was off aspirin since he was on Xarelto.  Paroxysmal A. fib -Rate controlled.  Continue Xarelto and beta-blocker  Hypertension Hyperlipidemia -Resume home medications  Diabetes mellitus type 2 -Resume home regimen.  Outpatient follow-up  Tobacco abuse -counseled about tobacco cessation.  Discharge Instructions  Discharge Instructions    Ambulatory referral to Cardiology   Complete by: As  directed    Followup for chestpain/CAD   Diet - low sodium heart healthy   Complete by: As directed    Diet Carb Modified   Complete by: As directed    Increase activity slowly   Complete by: As directed      Allergies as of 09/09/2018      Reactions   Bee Venom Anaphylaxis   Actos [pioglitazone Hydrochloride] Swelling   Lipitor [atorvastatin] Other (See Comments)   Patient cannot recall reaction   Lisinopril Other (See Comments)   cough      Medication List    STOP taking these medications   aspirin EC 81 MG tablet     TAKE these medications   acetaminophen 500 MG tablet Commonly known as: TYLENOL Take 1 tablet (500 mg total) by mouth every 6 (six) hours as needed.   atenolol-chlorthalidone 100-25 MG tablet Commonly known as: TENORETIC TAKE 1 TABLET BY MOUTH DAILY.   Benadryl 25 mg capsule Generic drug: diphenhydrAMINE Take 25 mg by mouth as needed (bee stings).   clopidogrel 75 MG tablet Commonly known as: PLAVIX Take 75 mg by mouth daily.   EPINEPHrine 0.3 mg/0.3 mL Soaj injection Commonly known as: EPI-PEN Inject 0.3 mLs (0.3 mg total) into the muscle as needed for anaphylaxis. What changed: See the new instructions.   escitalopram 10 MG tablet Commonly known as: Lexapro Take 1 tablet (10 mg total) by mouth daily.   insulin lispro protamine-lispro (75-25) 100 UNIT/ML Susp injection Commonly known as: HUMALOG 75/25 MIX Inject 60 Units into the skin daily.   Insulin Pen Needle 32G X 4 MM Misc Commonly known as: UltiCare Micro Pen Needles use with insulin pen to inject insulin  2x daily.   isosorbide mononitrate 30 MG 24 hr tablet Commonly known as: IMDUR TAKE 1 TABLET BY MOUTH DAILY. What changed: additional instructions   levothyroxine 175 MCG tablet Commonly known as: SYNTHROID TAKE 1 TABLET BY MOUTH DAILY. What changed: when to take this   linagliptin 5 MG Tabs tablet Commonly known as: Tradjenta Take 1 tablet (5 mg total) by mouth daily.    losartan 100 MG tablet Commonly known as: COZAAR TAKE 1 TABLET BY MOUTH DAILY.   metFORMIN 1000 MG tablet Commonly known as: GLUCOPHAGE Take 1 tablet (1,000 mg total) by mouth 2 (two) times daily with a meal.   nitroGLYCERIN 0.4 MG SL tablet Commonly known as: NITROSTAT Place 1 tablet (0.4 mg total) under the tongue every 5 (five) minutes as needed for chest pain.   Ozempic (0.25 or 0.5 MG/DOSE) 2 MG/1.5ML Sopn Generic drug: Semaglutide(0.25 or 0.5MG /DOS) Inject 0.5 mg into the skin every Monday.   pantoprazole 40 MG tablet Commonly known as: PROTONIX TAKE 1 TABLET (40 MG TOTAL) BY MOUTH DAILY.   potassium chloride 10 MEQ tablet Commonly known as: K-DUR TAKE 1 TABLET BY MOUTH 2 TIMES DAILY.   predniSONE 10 MG tablet Commonly known as: DELTASONE Take 10 mg by mouth daily as needed (if stung by bee).   rosuvastatin 20 MG tablet Commonly known as: CRESTOR TAKE 1 TABLET BY MOUTH AT BEDTIME.   Xarelto 20 MG Tabs tablet Generic drug: rivaroxaban TAKE 1 TABLET BY MOUTH DAILY. What changed: how much to take       Follow-up Information    Plotnikov, Anthony Lacks, MD. Schedule an appointment as soon as possible for a visit in 1 week(s).   Specialty: Internal Medicine Contact information: [place] [address] [phone]        Burnell Blanks, MD .   Specialty: Cardiology Contact information: [place] [address] [place] [address] [phone]          Allergies  Allergen Reactions  . Bee Venom Anaphylaxis  . Actos [Pioglitazone Hydrochloride] Swelling  . Lipitor [Atorvastatin] Other (See Comments)    Patient cannot recall reaction  . Lisinopril Other (See Comments)    cough    Consultations:  Spoke to Dr. Debara Pickett on phone   Procedures/Studies: Dg Chest Port 1 View  Result Date: 09/08/2018 CLINICAL DATA:  Generalized chest tightness for the past 2 days while at rest. EXAM: PORTABLE CHEST 1 VIEW COMPARISON:   05/02/2018. FINDINGS: Normal sized heart. Clear lungs. Stable mild peribronchial thickening. Aortic arch calcification. Unremarkable bones. IMPRESSION: No acute abnormality. Stable mild chronic bronchitic changes. Electronically Signed   By: Anthony Woods M.D.   On: 09/08/2018 10:07       Subjective: Patient seen and examined at bedside.  He denies any current chest pain or worsening shortness of breath.  He feels better and wants to go home.  Discharge Exam: Vitals:   09/09/18 0516 09/09/18 0840  BP: (!) 150/71 (!) 144/74  Pulse: (!) 56 (!) 58  Resp: 18   Temp: 98.4 F (36.9 C)   SpO2: 91% 93%    General: Pt is alert, awake, not in acute distress Cardiovascular: Mild bradycardia S1/S2 + Respiratory: bilateral decreased breath sounds at bases with some scattered crackles.  No wheezing Abdominal: Soft, obese, NT, ND, bowel sounds + Extremities: no edema, no cyanosis    The results of significant diagnostics from this hospitalization (including imaging, microbiology, ancillary and laboratory) are listed below for reference.  Microbiology: Recent Results (from the past 240 hour(s))  SARS Coronavirus 2     Status: None   Collection Time: 09/08/18 11:10 AM  Result Value Ref Range Status   SARS Coronavirus 2 NOT DETECTED NOT DETECTED Final    Comment: (NOTE) SARS-CoV-2 target nucleic acids are NOT DETECTED. The SARS-CoV-2 RNA is generally detectable in upper and lower respiratory specimens during the acute phase of infection.  Negative  results do not preclude SARS-CoV-2 infection, do not rule out co-infections with other pathogens, and should not be used as the sole basis for treatment or other patient management decisions.  Negative results must be combined with clinical observations, patient history, and epidemiological information. The expected result is Not Detected. Fact Sheet for  Patients: http://www.biofiredefense.com/wp-content/uploads/2020/03/BIOFIRE-COVID -19-patients.pdf Fact Sheet for Healthcare Providers: http://www.biofiredefense.com/wp-content/uploads/2020/03/BIOFIRE-COVID -19-hcp.pdf This test is not yet approved or cleared by the [place] and  has been authorized for detection and/or diagnosis of SARS-CoV-2 by FDA under an Emergency Use Authorization (EUA).  This EUA will remain in effec t (meaning this test can be used) for the duration of  the COVID-19 declaration under Section 564(b)(1) of the Act, 21 U.S.C. section 360bbb-3(b)(1), unless the authorization is terminated or revoked sooner. Performed at [facility], [place] [address] [place], [place] [address]      Labs: BNP (last 3 results) Recent Labs    09/08/18 0931  BNP 235.5*   Basic Metabolic Panel: Recent Labs  Lab 09/08/18 0931  NA 134*  K 3.6  CL 97*  CO2 27  GLUCOSE 234*  BUN 9  CREATININE 1.04  CALCIUM 9.7   Liver Function Tests: No results for input(s): AST, ALT, ALKPHOS, BILITOT, PROT, ALBUMIN in the last 168 hours. No results for input(s): LIPASE, AMYLASE in the last 168 hours. No results for input(s): AMMONIA in the last 168 hours. CBC: Recent Labs  Lab 09/08/18 0931  WBC 9.0  NEUTROABS 6.1  HGB 16.3  HCT 48.7  MCV 91.7  PLT 181   Cardiac Enzymes: Recent Labs  Lab 09/08/18 0931 09/08/18 1410 09/08/18 2109 09/09/18 0323  TROPONINI <0.03 <0.03 <0.03 <0.03   BNP: Invalid input(s): POCBNP CBG: Recent Labs  Lab 09/08/18 1816 09/08/18 1834 09/08/18 1857 09/08/18 2350 09/09/18 0758  GLUCAP 41* 47* 85 149* 187*   D-Dimer No results for input(s): DDIMER in the last 72 hours. Hgb A1c Recent Labs    09/08/18 1435  HGBA1C 9.3*   Lipid Profile No results for input(s): CHOL, HDL, LDLCALC, TRIG, CHOLHDL, LDLDIRECT in the last 72 hours. Thyroid function studies No results for input(s): TSH, T4TOTAL, T3FREE, THYROIDAB in the last 72  hours.  Invalid input(s): FREET3 Anemia work up No results for input(s): VITAMINB12, FOLATE, FERRITIN, TIBC, IRON, RETICCTPCT in the last 72 hours. Urinalysis    Component Value Date/Time   COLORURINE YELLOW 09/14/2017 0850   APPEARANCEUR CLEAR 09/14/2017 0850   LABSPEC 1.015 09/14/2017 0850   PHURINE 7.5 09/14/2017 0850   GLUCOSEU 250 (A) 09/14/2017 0850   HGBUR NEGATIVE 09/14/2017 0850   BILIRUBINUR NEGATIVE 09/14/2017 0850   BILIRUBINUR neg 06/19/2014 0936   KETONESUR NEGATIVE 09/14/2017 0850   PROTEINUR 100 (A) 05/11/2016 2204   UROBILINOGEN 0.2 09/14/2017 0850   NITRITE NEGATIVE 09/14/2017 0850   LEUKOCYTESUR NEGATIVE 09/14/2017 0850   Sepsis Labs Invalid input(s): PROCALCITONIN,  WBC,  LACTICIDVEN Microbiology Recent Results (from the past 240 hour(s))  SARS Coronavirus 2     Status: None   Collection Time: 09/08/18 11:10 AM  Result Value Ref Range Status   SARS Coronavirus 2 NOT DETECTED NOT DETECTED Final    Comment: (NOTE) SARS-CoV-2 target nucleic acids are NOT DETECTED. The SARS-CoV-2 RNA is generally detectable in upper and lower respiratory specimens during the acute phase of infection.  Negative  results do not preclude SARS-CoV-2 infection, do not rule out co-infections with other pathogens, and should not be used as the sole basis for treatment or other patient management decisions.  Negative results must be combined with clinical observations, patient history, and epidemiological information. The expected result is Not Detected. Fact Sheet for Patients: http://www.biofiredefense.com/wp-content/uploads/2020/03/BIOFIRE-COVID -19-patients.pdf Fact Sheet for Healthcare Providers: http://www.biofiredefense.com/wp-content/uploads/2020/03/BIOFIRE-COVID -19-hcp.pdf This test is not yet approved or cleared by the [place] and  has been authorized for detection and/or diagnosis of SARS-CoV-2 by FDA under an Emergency Use Authorization (EUA).  This EUA  will remain in effec t (meaning this test can be used) for the duration of  the COVID-19 declaration under Section 564(b)(1) of the Act, 21 U.S.C. section 360bbb-3(b)(1), unless the authorization is terminated or revoked sooner. Performed at [facility], [place] [address] [place], [place] [address]      Time coordinating discharge: 35 minutes  SIGNED:   Aline August, MD  [facility] 09/09/2018, 9:00 AM

## 2018-09-09 NOTE — Progress Notes (Signed)
Patient alert oriented x4. Respirations even unlabored, room air. Denies chest pain. Care plan resolved. PIV removed. AVS instructions provided. Patient discharged via wheelchair home with wife.

## 2018-09-11 ENCOUNTER — Telehealth: Payer: Self-pay | Admitting: *Deleted

## 2018-09-11 NOTE — Telephone Encounter (Signed)
Called pt concerning appt that's scheduled for 09/12/18 hosp f/u. Pt states he called this am and made appt. He came home on Saturday. Completed TCM call below.Anthony Woods  Transition Care Management Follow-up Telephone Call   Date discharged? 09/09/18   How have you been since you were released from the hospital? Pt states he is doing alright   Do you understand why you were in the hospital? YES   Do you understand the discharge instructions? YES   Where were you discharged to? Home   Items Reviewed:  Medications reviewed: YES. He states he was told to stop aspirin until he follow=up w/his cardiologist  Allergies reviewed: YES  Dietary changes reviewed: YES  Referrals reviewed: No referral recommended, but he has a follow=up appt schedule w/his cardiologist on 09/21/18   Functional Questionnaire:   Activities of Daily Living (ADLs):   He states he are independent in the following: ambulation, bathing and hygiene, feeding, continence, grooming, toileting and dressing States he doesn't require assistance    Any transportation issues/concerns?: NO   Any patient concerns? NO   Confirmed importance and date/time of follow-up visits scheduled YES, appt 09/12/18  Provider Appointment booked with Dr. Alain Marion  Confirmed with patient if condition begins to worsen call PCP or go to the ER.  Patient was given the office number and encouraged to call back with question or concerns.  : YES

## 2018-09-12 ENCOUNTER — Encounter: Payer: Self-pay | Admitting: Internal Medicine

## 2018-09-12 ENCOUNTER — Other Ambulatory Visit: Payer: Self-pay

## 2018-09-12 ENCOUNTER — Ambulatory Visit (INDEPENDENT_AMBULATORY_CARE_PROVIDER_SITE_OTHER): Payer: 59 | Admitting: Internal Medicine

## 2018-09-12 DIAGNOSIS — F17200 Nicotine dependence, unspecified, uncomplicated: Secondary | ICD-10-CM

## 2018-09-12 DIAGNOSIS — I25110 Atherosclerotic heart disease of native coronary artery with unstable angina pectoris: Secondary | ICD-10-CM

## 2018-09-12 DIAGNOSIS — E11649 Type 2 diabetes mellitus with hypoglycemia without coma: Secondary | ICD-10-CM | POA: Diagnosis not present

## 2018-09-12 NOTE — Assessment & Plan Note (Signed)
Cont current Rx F/u w/Dr Chalmers Cater

## 2018-09-12 NOTE — Assessment & Plan Note (Signed)
Crestor NTG prn Smoking less: 1.5 ppd

## 2018-09-12 NOTE — Progress Notes (Signed)
Subjective:  Patient ID: Anthony Woods, male    DOB: [birth year]  Age: 63 y.o. MRN: [id number]  CC: No chief complaint on file.   HPI Anthony Woods presents for post-hosp f/u - CP adm - d/c home on 6/20. MI r/o. Card f/u on 09/21/18. Smoking less: 1.5 ppd  Per hx: "Recommendations for Outpatient Follow-up:  1. Follow up with PCP in 1 week with repeat CBC/BMP 2. Follow-up with cardiology 3. Follow up in ED if symptoms worsen or new appear   Home Health: No Equipment/Devices: None  Discharge Condition: Stable CODE STATUS: Full Diet recommendation: Heart healthy/carb modified  Brief/Interim Summary: 63 year old male with history of CAD with stents, paroxysmal A. fib, hypertension, tobacco abuse, hyperlipidemia, diabetes mellitus type 2 presented with chest tightness and exertional dyspnea.  He was kept under observation for the same.  His troponins x3 have been negative.  No current chest pain.  Echo looks okay.  He will be discharged home with close outpatient follow-up with cardiology.  Discharge Diagnoses:  Active Problems:   Chest pain  Chest pain/tightness with exertional dyspnea in a patient with CAD status post stents -Chest x-ray showed no acute abnormality.  EKG unremarkable.  Troponins x3 have been negative.  Echo showed EF of 60 to 65%.   -No current chest pain. -Feels better and wants to go home -Spoke with Dr. Hilty/cardiology on phone and he will arrange for close outpatient follow-up with cardiology.  He was okay for the patient to be discharged. -Continue Plavix, statin and beta-blocker.  As per last cardiology note, he was off aspirin since he was on Xarelto.  Paroxysmal A. fib -Rate controlled.  Continue Xarelto and beta-blocker  Hypertension Hyperlipidemia -Resume home medications  Diabetes mellitus type 2 -Resume home regimen.  Outpatient follow-up  Tobacco abuse -counseled about tobacco cessation.  Discharge Instructions  Discharge  Instructions    Ambulatory referral to Cardiology   Complete by: As directed  "    Outpatient Medications Prior to Visit  Medication Sig Dispense Refill  . acetaminophen (TYLENOL) 500 MG tablet Take 1 tablet (500 mg total) by mouth every 6 (six) hours as needed. 30 tablet 0  . atenolol-chlorthalidone (TENORETIC) 100-25 MG tablet TAKE 1 TABLET BY MOUTH DAILY. 30 tablet 5  . clopidogrel (PLAVIX) 75 MG tablet Take 75 mg by mouth daily.    . diphenhydrAMINE (BENADRYL) 25 mg capsule Take 25 mg by mouth as needed (bee stings).    . EPINEPHrine 0.3 mg/0.3 mL IJ SOAJ injection Inject 0.3 mLs (0.3 mg total) into the muscle as needed for anaphylaxis.    Marland Kitchen escitalopram (LEXAPRO) 10 MG tablet Take 1 tablet (10 mg total) by mouth daily. 90 tablet 3  . insulin lispro protamine-lispro (HUMALOG 75/25 MIX) (75-25) 100 UNIT/ML SUSP injection Inject 60 Units into the skin daily.    . Insulin Pen Needle (ULTICARE MICRO PEN NEEDLES) 32G X 4 MM MISC use with insulin pen to inject insulin 2x daily. 100 each PRN  . isosorbide mononitrate (IMDUR) 30 MG 24 hr tablet TAKE 1 TABLET BY MOUTH DAILY. (Patient taking differently: Take 30 mg by mouth daily. ** DO NOT CRUSH **) 90 tablet 1  . levothyroxine (SYNTHROID, LEVOTHROID) 175 MCG tablet TAKE 1 TABLET BY MOUTH DAILY. (Patient taking differently: Take 175 mcg by mouth daily before breakfast. ) 30 tablet 11  . linagliptin (TRADJENTA) 5 MG TABS tablet Take 1 tablet (5 mg total) by mouth daily. 90 tablet 3  . losartan (COZAAR)  100 MG tablet TAKE 1 TABLET BY MOUTH DAILY. (Patient taking differently: Take 100 mg by mouth daily. ) 30 tablet 11  . metFORMIN (GLUCOPHAGE) 1000 MG tablet Take 1 tablet (1,000 mg total) by mouth 2 (two) times daily with a meal. 180 tablet 3  . nitroGLYCERIN (NITROSTAT) 0.4 MG SL tablet Place 1 tablet (0.4 mg total) under the tongue every 5 (five) minutes as needed for chest pain. 30 tablet 0  . OZEMPIC, 0.25 OR 0.5 MG/DOSE, 2 MG/1.5ML SOPN Inject  0.5 mg into the skin every Monday.   6  . pantoprazole (PROTONIX) 40 MG tablet TAKE 1 TABLET (40 MG TOTAL) BY MOUTH DAILY. 30 tablet 11  . potassium chloride (K-DUR) 10 MEQ tablet TAKE 1 TABLET BY MOUTH 2 TIMES DAILY. (Patient taking differently: Take 10 mEq by mouth 2 (two) times daily. ) 60 tablet 11  . predniSONE (DELTASONE) 10 MG tablet Take 10 mg by mouth daily as needed (if stung by bee).    . rosuvastatin (CRESTOR) 20 MG tablet TAKE 1 TABLET BY MOUTH AT BEDTIME. (Patient taking differently: Take 20 mg by mouth at bedtime. ) 90 tablet 3  . XARELTO 20 MG TABS tablet TAKE 1 TABLET BY MOUTH DAILY. (Patient taking differently: Take 20 mg by mouth daily. ) 30 tablet 11   No facility-administered medications prior to visit.     ROS: Review of Systems  Constitutional: Positive for unexpected weight change. Negative for appetite change and fatigue.  HENT: Negative for congestion, nosebleeds, sneezing, sore throat and trouble swallowing.   Eyes: Negative for itching and visual disturbance.  Respiratory: Positive for shortness of breath. Negative for cough.   Cardiovascular: Negative for chest pain, palpitations and leg swelling.  Gastrointestinal: Negative for abdominal distention, blood in stool, diarrhea and nausea.  Genitourinary: Negative for frequency and hematuria.  Musculoskeletal: Negative for back pain, gait problem, joint swelling and neck pain.  Skin: Negative for rash.  Neurological: Negative for dizziness, tremors, speech difficulty and weakness.  Psychiatric/Behavioral: Negative for agitation, dysphoric mood, sleep disturbance and suicidal ideas. The patient is not nervous/anxious.     Objective:  BP 136/72 (BP Location: Left Arm, Patient Position: Sitting, Cuff Size: Large)   Pulse (!) 56   Temp 98.2 F (36.8 C) (Oral)   Ht 5\' 5"  (1.651 m)   Wt 244 lb (110.7 kg)   SpO2 94%   BMI 40.60 kg/m   BP Readings from Last 3 Encounters:  09/12/18 136/72  09/09/18 (!) 144/74   05/09/18 134/72    Wt Readings from Last 3 Encounters:  09/12/18 244 lb (110.7 kg)  09/09/18 237 lb 14.4 oz (107.9 kg)  05/09/18 248 lb (112.5 kg)    Physical Exam Constitutional:      General: He is not in acute distress.    Appearance: He is well-developed.     Comments: NAD  Eyes:     Conjunctiva/sclera: Conjunctivae normal.     Pupils: Pupils are equal, round, and reactive to light.  Neck:     Musculoskeletal: Normal range of motion.     Thyroid: No thyromegaly.     Vascular: No JVD.  Cardiovascular:     Rate and Rhythm: Normal rate and regular rhythm.     Heart sounds: Normal heart sounds. No murmur. No friction rub. No gallop.   Pulmonary:     Effort: Pulmonary effort is normal. No respiratory distress.     Breath sounds: Normal breath sounds. No wheezing or rales.  Chest:  Chest wall: No tenderness.  Abdominal:     General: Bowel sounds are normal. There is no distension.     Palpations: Abdomen is soft. There is no mass.     Tenderness: There is no abdominal tenderness. There is no guarding or rebound.  Musculoskeletal: Normal range of motion.        General: No tenderness.  Lymphadenopathy:     Cervical: No cervical adenopathy.  Skin:    General: Skin is warm and dry.     Findings: No rash.  Neurological:     Mental Status: He is alert and oriented to person, place, and time.     Cranial Nerves: No cranial nerve deficit.     Motor: No abnormal muscle tone.     Coordination: Coordination normal.     Gait: Gait normal.     Deep Tendon Reflexes: Reflexes are normal and symmetric.  Psychiatric:        Behavior: Behavior normal.        Thought Content: Thought content normal.        Judgment: Judgment normal.     Lab Results  Component Value Date   WBC 9.0 09/08/2018   HGB 16.3 09/08/2018   HCT 48.7 09/08/2018   PLT 181 09/08/2018   GLUCOSE 234 (H) 09/08/2018   CHOL 125 09/14/2017   TRIG 187.0 (H) 09/14/2017   HDL 34.20 (L) 09/14/2017    LDLDIRECT 100.0 12/09/2014   LDLCALC 53 09/14/2017   ALT 21 09/14/2017   AST 19 09/14/2017   NA 134 (L) 09/08/2018   K 3.6 09/08/2018   CL 97 (L) 09/08/2018   CREATININE 1.04 09/08/2018   BUN 9 09/08/2018   CO2 27 09/08/2018   TSH 1.25 09/14/2017   PSA 0.41 09/14/2017   INR 1.12 05/12/2016   HGBA1C 9.3 (H) 09/08/2018   MICROALBUR 4.7 (H) 12/06/2013    Dg Chest Port 1 View  Result Date: 09/08/2018 CLINICAL DATA:  Generalized chest tightness for the past 2 days while at rest. EXAM: PORTABLE CHEST 1 VIEW COMPARISON:  05/02/2018. FINDINGS: Normal sized heart. Clear lungs. Stable mild peribronchial thickening. Aortic arch calcification. Unremarkable bones. IMPRESSION: No acute abnormality. Stable mild chronic bronchitic changes. Electronically Signed   By: Claudie Revering M.D.   On: 09/08/2018 10:07    Assessment & Plan:   There are no diagnoses linked to this encounter.   No orders of the defined types were placed in this encounter.    Follow-up: No follow-ups on file.  Walker Kehr, MD

## 2018-09-12 NOTE — Assessment & Plan Note (Signed)
  Smoking less: 1.5 ppd

## 2018-09-19 ENCOUNTER — Encounter: Payer: Self-pay | Admitting: Physician Assistant

## 2018-09-19 NOTE — H&P (View-Only) (Signed)
Virtual Visit via Telephone Note   This visit type was conducted due to national recommendations for restrictions regarding the COVID-19 Pandemic (e.g. social distancing) in an effort to limit this patient's exposure and mitigate transmission in our community.  Due to his co-morbid illnesses, this patient is at least at moderate risk for complications without adequate follow up.  This format is felt to be most appropriate for this patient at this time.  The patient did not have access to video technology/had technical difficulties with video requiring transitioning to audio format only (telephone).  All issues noted in this document were discussed and addressed.  No physical exam could be performed with this format.  Please refer to the patient's chart for his  consent to telehealth for [facility].   Date:  09/21/2018   ID:  Anthony Woods, DOB [birth year], MRN [id number]  Patient Location: Home Provider Location: Home  PCP:  Cassandria Anger, MD  Cardiologist:  Lauree Chandler, MD  Electrophysiologist:  None   Evaluation Performed:  Follow-Up Visit  Chief Complaint:  F/u chest pain  History of Present Illness:    Anthony Woods is a 63 y.o. male with paroxsmal atrial fibrillation, CAD (DES to LCx 04/2016, DES to prox Cx 08/2017), HTN, DM, obesity, bradycardia and tobacco abuse who is here today for cardiac follow up. He was admitted to [facility] February 2018 with chest pain and was found to be in atrial fibrillation. Cardiac cath February 2018 showed severe stenosis in the circumflex treated with a Synergy drug eluting stent. He was discharged on Plavix and Xarelto. He developed recurrent chest pain in May-June of 2019. Cardiac cath 08/31/17 showed severe stenosis in the proximal circumflex treated with DES. He otherwise had 20% mid LAD, 40% mid RCA, normal LVEDP and normal LVEF. The other stent in the Circumflex was patent. He was discharged on ASA (1 month) + Plavix + Xarelto. He had  normal ABIs in July 2019. He was recently admitted in 08/2018 with chest pain and r/o for MI. Case was discussed with Dr. Debara Pickett who recommended outpatient f/u. Last labs 08/2018 showed A1C 9.3, BNP 141, Na 134, K 3.6, Cr 1.04, normal CBC, 08/2017 LDL 53.  The patient is seen virtually in follow-up accompanied by his wife Anthony Woods. He has a reserved affect. He reports for the last month he's had dyspnea on exertion, relieved with rest. He has not had any SOB at rest. He denies any orthopnea. He has also had some intermittent chest discomfort. At first it was reported as an atypical feeling briefly only when he lays on his left side but then noted perhaps there is an associated of tightness with exertion as well. He does not regularly follow his BP at home. He answers "I don't know" when asked if this reminds him of his prior angina. He does not snore but reports significant daytime fatigue and sleeps a lot during the day. The patient does not have symptoms concerning for COVID-19 infection (fever, chills). He tested covid negative on 6/19.   Past Medical History:  Diagnosis Date   Allergy to bee sting    Anxiety    Arthritis    "right knee" (05/12/2016)   CAD (coronary artery disease)    a. DES to LCx 04/2016. b. DES to prox Cx 08/2017.   Headache    "when his sugar goes too high or too low" (05/12/2016)   HTN (hypertension)    Hyperlipidemia    Hypothyroidism  Macular degeneration    Obesity    PAF (paroxysmal atrial fibrillation) ([place])    Type II diabetes mellitus ([place])    Past Surgical History:  Procedure Laterality Date   CARDIAC CATHETERIZATION     COLONOSCOPY     CORONARY ANGIOPLASTY WITH STENT PLACEMENT  05/12/2016   CORONARY STENT INTERVENTION N/A 05/12/2016   Procedure: Coronary Stent Intervention;  Surgeon: Jettie Booze, MD;  Location: [place] [facility];  Service: Cardiovascular;  Laterality: N/A;   CORONARY STENT INTERVENTION N/A 08/31/2017   Procedure:  CORONARY STENT INTERVENTION;  Surgeon: Jettie Booze, MD;  Location: [place] [facility];  Service: Cardiovascular;  Laterality: N/A;   GANGLION CYST EXCISION Left 1980s   KNEE ARTHROSCOPY Right    Meniscus removal   LEFT HEART CATH AND CORONARY ANGIOGRAPHY N/A 05/12/2016   Procedure: Left Heart Cath and Coronary Angiography;  Surgeon: Jettie Booze, MD;  Location: [place] CV LAB;  Service: Cardiovascular;  Laterality: N/A;   LEFT HEART CATH AND CORONARY ANGIOGRAPHY N/A 08/31/2017   Procedure: LEFT HEART CATH AND CORONARY ANGIOGRAPHY;  Surgeon: Jettie Booze, MD;  Location: [place] CV [facility];  Service: Cardiovascular;  Laterality: N/A;   LEFT HEART CATHETERIZATION WITH CORONARY ANGIOGRAM N/A 02/04/2012   Procedure: LEFT HEART CATHETERIZATION WITH CORONARY ANGIOGRAM;  Surgeon: Burnell Blanks, MD;  Location: [facility] CATH LAB;  Service: Cardiovascular;  Laterality: N/A;   POLYPECTOMY       Current Meds  Medication Sig   acetaminophen (TYLENOL) 500 MG tablet Take 1 tablet (500 mg total) by mouth every 6 (six) hours as needed.   atenolol-chlorthalidone (TENORETIC) 100-25 MG tablet TAKE 1 TABLET BY MOUTH DAILY.   clopidogrel (PLAVIX) 75 MG tablet Take 75 mg by mouth daily.   diphenhydrAMINE (BENADRYL) 25 mg capsule Take 25 mg by mouth as needed (bee stings).   EPINEPHrine 0.3 mg/0.3 mL IJ SOAJ injection Inject 0.3 mLs (0.3 mg total) into the muscle as needed for anaphylaxis.   escitalopram (LEXAPRO) 10 MG tablet Take 1 tablet (10 mg total) by mouth daily.   insulin lispro protamine-lispro (HUMALOG 75/25 MIX) (75-25) 100 UNIT/ML SUSP injection Inject 60 Units into the skin daily.   Insulin Pen Needle (ULTICARE MICRO PEN NEEDLES) 32G X 4 MM MISC use with insulin pen to inject insulin 2x daily.   isosorbide mononitrate (IMDUR) 30 MG 24 hr tablet TAKE 1 TABLET BY MOUTH DAILY.   levothyroxine (SYNTHROID, LEVOTHROID) 175 MCG tablet TAKE 1 TABLET BY MOUTH DAILY.     linagliptin (TRADJENTA) 5 MG TABS tablet Take 1 tablet (5 mg total) by mouth daily.   losartan (COZAAR) 100 MG tablet TAKE 1 TABLET BY MOUTH DAILY.   metFORMIN (GLUCOPHAGE) 1000 MG tablet Take 1 tablet (1,000 mg total) by mouth 2 (two) times daily with a meal.   nitroGLYCERIN (NITROSTAT) 0.4 MG SL tablet Place 1 tablet (0.4 mg total) under the tongue every 5 (five) minutes as needed for chest pain.   OZEMPIC, 0.25 OR 0.5 MG/DOSE, 2 MG/1.5ML SOPN Inject 0.5 mg into the skin every Monday.    potassium chloride (K-DUR) 10 MEQ tablet TAKE 1 TABLET BY MOUTH 2 TIMES DAILY.   predniSONE (DELTASONE) 10 MG tablet Take 10 mg by mouth daily as needed (if stung by bee).   rosuvastatin (CRESTOR) 20 MG tablet TAKE 1 TABLET BY MOUTH AT BEDTIME.   XARELTO 20 MG TABS tablet TAKE 1 TABLET BY MOUTH DAILY.     Allergies:   Bee venom,  Actos [pioglitazone hydrochloride], Lipitor [atorvastatin], and Lisinopril   Social History   Tobacco Use   Smoking status: Current Every Day Smoker    Packs/day: 1.50    Years: 42.00    Pack years: 63.00    Types: Cigarettes   Smokeless tobacco: Never Used  Substance Use Topics   Alcohol use: No   Drug use: No     Family Hx: The patient's family history includes Breast cancer in his sister; CAD in his maternal grandmother; CVA in his father; Cancer in his father and mother; Colon cancer (age of onset: 6) in his mother; Diabetes in his father, mother, and sister; Stomach cancer in his father. There is no history of Esophageal cancer or Rectal cancer.  ROS:   Please see the history of present illness.    All other systems reviewed and are negative.   Prior CV studies:    Most recent pertinent cardiac studies are outlined above.  Labs/Other Tests and Data Reviewed:    EKG:  An ECG dated 09/08/18 was personally reviewed today and demonstrated:  NSR 64bpm, occasional PVC, left axis deviation, no acute STT changes  Recent Labs: 09/08/2018: B Natriuretic  Peptide 141.1; BUN 9; Creatinine, Ser 1.04; Hemoglobin 16.3; Platelets 181; Potassium 3.6; Sodium 134   Recent Lipid Panel Lab Results  Component Value Date/Time   CHOL 125 09/14/2017 08:50 AM   TRIG 187.0 (H) 09/14/2017 08:50 AM   TRIG 219 (HH) 02/08/2006 07:57 AM   HDL 34.20 (L) 09/14/2017 08:50 AM   CHOLHDL 4 09/14/2017 08:50 AM   LDLCALC 53 09/14/2017 08:50 AM   LDLDIRECT 100.0 12/09/2014 08:27 AM    Wt Readings from Last 3 Encounters:  09/21/18 237 lb (107.5 kg)  09/12/18 244 lb (110.7 kg)  09/09/18 237 lb 14.4 oz (107.9 kg)     Objective:    Vital Signs:  BP (!) 152/96    Pulse (!) 59    Ht 5\' 5"  (1.651 m)    Wt 237 lb (107.5 kg)    BMI 39.44 kg/m    VS reviewed. General - stoic M in no acute distress Pulm - No labored breathing, no coughing during visit, no audible wheezing, speaking in full sentences Neuro - A+Ox3, no slurred speech, answers questions appropriately Psych - Reserved affect (requires several questions and urging of wife to elicit detalis about recent symptoms)    ASSESSMENT & PLAN:    1. Dyspnea on exertion/precordial chest pain - concerning for angina. Reviewed with Dr. Angelena Form. While it's possible his symptoms could be due to elevated BP, the pattern is concerning for ischemia given his prior history. It seems in the past that each time he's reported cardiac symptoms he was proven to have new disease. Therefore, per discussion with Dr. Angelena Form, plan heart cath when he is in the lab next for further evaluation. Given his dyspnea and longstanding tobacco abuse will make this a right and left heart cath. Risks and benefits of cardiac catheterization have been discussed with the patient.  These include bleeding, infection, kidney damage, stroke, heart attack, death.  The patient understands these risks and is willing to proceed. Offered earlier date in lab but pt prefers to keep with Dr. Angelena Form. Will also start amlodipine 5mg  daily not only for high BP but  also antianginal. He is already on Imdur and high dose atenolol. I did not think increasing his Imdur would impact his BP as much as it would need to, therefore chose to add amlodipine. RMA  went over pre-cath Covid testing and DM med protocol. I elected not to hold his atenolol/chlorthalidone pre-cath given the fact that he is on the combo pill and holding both could possibly result in some degree of hypertensive urgency post cath. Last dose of Xarelto will be Tuesday 7/7 for cath Thursday 7/9. Warning sx/ER precautions reviewed with patient. 2. CAD - as above. F/u cath results. Will also get lipid profile when he comes in for pre-cath labs. 3. Paroxysmal atrial fibrillation - was in NSR at recent OV. Continue BB and Xarelto except with instructions above pre-cath. 4. Resistant HTN - has been elevated recently in the hospital and today despite high dose atenlolol, chlorthalidone, max dose losartan, and Imdur. He reports significant daytime fatigue. Will arrange sleep study to evaluate sleep apnea. Will also get TSH, free T4 when he comes in for labs. If these are unrevealing and HTN persists will need to think about additional workup.  COVID-19 Education: The signs and symptoms of COVID-19 were discussed with the patient and how to seek care for testing (follow up with PCP or arrange E-visit). The importance of social distancing was discussed today.  Time:   Today, I have spent 20 minutes with the patient with telehealth technology discussing the above problems.     Medication Adjustments/Labs and Tests Ordered: Current medicines are reviewed at length with the patient today.  Concerns regarding medicines are outlined above.   Disposition:  Follow up 2 weeks after cath.  Signed, Charlie Pitter, PA-C  09/21/2018 11:16 AM    [place]

## 2018-09-19 NOTE — Progress Notes (Signed)
Virtual Visit via Telephone Note   This visit type was conducted due to national recommendations for restrictions regarding the COVID-19 Pandemic (e.g. social distancing) in an effort to limit this patient's exposure and mitigate transmission in our community.  Due to his co-morbid illnesses, this patient is at least at moderate risk for complications without adequate follow up.  This format is felt to be most appropriate for this patient at this time.  The patient did not have access to video technology/had technical difficulties with video requiring transitioning to audio format only (telephone).  All issues noted in this document were discussed and addressed.  No physical exam could be performed with this format.  Please refer to the patient's chart for his  consent to telehealth for [facility].   Date:  09/21/2018   ID:  Anthony Woods, DOB Aug 30, 1955, MRN [id number]  Patient Location: Home Provider Location: Home  PCP:  Cassandria Anger, MD  Cardiologist:  Lauree Chandler, MD  Electrophysiologist:  None   Evaluation Performed:  Follow-Up Visit  Chief Complaint:  F/u chest pain  History of Present Illness:    Anthony Woods is a 63 y.o. male with paroxsmal atrial fibrillation, CAD (DES to LCx 04/2016, DES to prox Cx 08/2017), HTN, DM, obesity, bradycardia and tobacco abuse who is here today for cardiac follow up. He was admitted to [facility] February 2018 with chest pain and was found to be in atrial fibrillation. Cardiac cath February 2018 showed severe stenosis in the circumflex treated with a Synergy drug eluting stent. He was discharged on Plavix and Xarelto. He developed recurrent chest pain in May-June of 2019. Cardiac cath 08/31/17 showed severe stenosis in the proximal circumflex treated with DES. He otherwise had 20% mid LAD, 40% mid RCA, normal LVEDP and normal LVEF. The other stent in the Circumflex was patent. He was discharged on ASA (1 month) + Plavix + Xarelto. He had  normal ABIs in July 2019. He was recently admitted in 08/2018 with chest pain and r/o for MI. Case was discussed with Dr. Debara Pickett who recommended outpatient f/u. Last labs 08/2018 showed A1C 9.3, BNP 141, Na 134, K 3.6, Cr 1.04, normal CBC, 08/2017 LDL 53.  The patient is seen virtually in follow-up accompanied by his wife [name]. He has a reserved affect. He reports for the last month he's had dyspnea on exertion, relieved with rest. He has not had any SOB at rest. He denies any orthopnea. He has also had some intermittent chest discomfort. At first it was reported as an atypical feeling briefly only when he lays on his left side but then noted perhaps there is an associated of tightness with exertion as well. He does not regularly follow his BP at home. He answers "I don't know" when asked if this reminds him of his prior angina. He does not snore but reports significant daytime fatigue and sleeps a lot during the day. The patient does not have symptoms concerning for COVID-19 infection (fever, chills). He tested covid negative on 6/19.   Past Medical History:  Diagnosis Date   Allergy to bee sting    Anxiety    Arthritis    "right knee" (05/12/2016)   CAD (coronary artery disease)    a. DES to LCx 04/2016. b. DES to prox Cx 08/2017.   Headache    "when his sugar goes too high or too low" (05/12/2016)   HTN (hypertension)    Hyperlipidemia    Hypothyroidism  Macular degeneration    Obesity    PAF (paroxysmal atrial fibrillation) ([place])    Type II diabetes mellitus ([place])    Past Surgical History:  Procedure Laterality Date   CARDIAC CATHETERIZATION     COLONOSCOPY     CORONARY ANGIOPLASTY WITH STENT PLACEMENT  05/12/2016   CORONARY STENT INTERVENTION N/A 05/12/2016   Procedure: Coronary Stent Intervention;  Surgeon: Jettie Booze, MD;  Location: [place] [facility];  Service: Cardiovascular;  Laterality: N/A;   CORONARY STENT INTERVENTION N/A 08/31/2017   Procedure:  CORONARY STENT INTERVENTION;  Surgeon: Jettie Booze, MD;  Location: [place] [facility];  Service: Cardiovascular;  Laterality: N/A;   GANGLION CYST EXCISION Left 1980s   KNEE ARTHROSCOPY Right    Meniscus removal   LEFT HEART CATH AND CORONARY ANGIOGRAPHY N/A 05/12/2016   Procedure: Left Heart Cath and Coronary Angiography;  Surgeon: Jettie Booze, MD;  Location: [place] [facility];  Service: Cardiovascular;  Laterality: N/A;   LEFT HEART CATH AND CORONARY ANGIOGRAPHY N/A 08/31/2017   Procedure: LEFT HEART CATH AND CORONARY ANGIOGRAPHY;  Surgeon: Jettie Booze, MD;  Location: [place] [facility];  Service: Cardiovascular;  Laterality: N/A;   LEFT HEART CATHETERIZATION WITH CORONARY ANGIOGRAM N/A 02/04/2012   Procedure: LEFT HEART CATHETERIZATION WITH CORONARY ANGIOGRAM;  Surgeon: Burnell Blanks, MD;  Location: [facility] CATH LAB;  Service: Cardiovascular;  Laterality: N/A;   POLYPECTOMY       Current Meds  Medication Sig   acetaminophen (TYLENOL) 500 MG tablet Take 1 tablet (500 mg total) by mouth every 6 (six) hours as needed.   atenolol-chlorthalidone (TENORETIC) 100-25 MG tablet TAKE 1 TABLET BY MOUTH DAILY.   clopidogrel (PLAVIX) 75 MG tablet Take 75 mg by mouth daily.   diphenhydrAMINE (BENADRYL) 25 mg capsule Take 25 mg by mouth as needed (bee stings).   EPINEPHrine 0.3 mg/0.3 mL IJ SOAJ injection Inject 0.3 mLs (0.3 mg total) into the muscle as needed for anaphylaxis.   escitalopram (LEXAPRO) 10 MG tablet Take 1 tablet (10 mg total) by mouth daily.   insulin lispro protamine-lispro (HUMALOG 75/25 MIX) (75-25) 100 UNIT/ML SUSP injection Inject 60 Units into the skin daily.   Insulin Pen Needle (ULTICARE MICRO PEN NEEDLES) 32G X 4 MM MISC use with insulin pen to inject insulin 2x daily.   isosorbide mononitrate (IMDUR) 30 MG 24 hr tablet TAKE 1 TABLET BY MOUTH DAILY.   levothyroxine (SYNTHROID, LEVOTHROID) 175 MCG tablet TAKE 1 TABLET BY MOUTH DAILY.     linagliptin (TRADJENTA) 5 MG TABS tablet Take 1 tablet (5 mg total) by mouth daily.   losartan (COZAAR) 100 MG tablet TAKE 1 TABLET BY MOUTH DAILY.   metFORMIN (GLUCOPHAGE) 1000 MG tablet Take 1 tablet (1,000 mg total) by mouth 2 (two) times daily with a meal.   nitroGLYCERIN (NITROSTAT) 0.4 MG SL tablet Place 1 tablet (0.4 mg total) under the tongue every 5 (five) minutes as needed for chest pain.   OZEMPIC, 0.25 OR 0.5 MG/DOSE, 2 MG/1.5ML SOPN Inject 0.5 mg into the skin every Monday.    potassium chloride (K-DUR) 10 MEQ tablet TAKE 1 TABLET BY MOUTH 2 TIMES DAILY.   predniSONE (DELTASONE) 10 MG tablet Take 10 mg by mouth daily as needed (if stung by bee).   rosuvastatin (CRESTOR) 20 MG tablet TAKE 1 TABLET BY MOUTH AT BEDTIME.   XARELTO 20 MG TABS tablet TAKE 1 TABLET BY MOUTH DAILY.     Allergies:   Bee venom,  Actos [pioglitazone hydrochloride], Lipitor [atorvastatin], and Lisinopril   Social History   Tobacco Use   Smoking status: Current Every Day Smoker    Packs/day: 1.50    Years: 42.00    Pack years: 63.00    Types: Cigarettes   Smokeless tobacco: Never Used  Substance Use Topics   Alcohol use: No   Drug use: No     Family Hx: The patient's family history includes Breast cancer in his sister; CAD in his maternal grandmother; CVA in his father; Cancer in his father and mother; Colon cancer (age of onset: 25) in his mother; Diabetes in his father, mother, and sister; Stomach cancer in his father. There is no history of Esophageal cancer or Rectal cancer.  ROS:   Please see the history of present illness.    All other systems reviewed and are negative.   Prior CV studies:    Most recent pertinent cardiac studies are outlined above.  Labs/Other Tests and Data Reviewed:    EKG:  An ECG dated 09/08/18 was personally reviewed today and demonstrated:  NSR 64bpm, occasional PVC, left axis deviation, no acute STT changes  Recent Labs: 09/08/2018: B Natriuretic  Peptide 141.1; BUN 9; Creatinine, Ser 1.04; Hemoglobin 16.3; Platelets 181; Potassium 3.6; Sodium 134   Recent Lipid Panel Lab Results  Component Value Date/Time   CHOL 125 09/14/2017 08:50 AM   TRIG 187.0 (H) 09/14/2017 08:50 AM   TRIG 219 (HH) 02/08/2006 07:57 AM   HDL 34.20 (L) 09/14/2017 08:50 AM   CHOLHDL 4 09/14/2017 08:50 AM   LDLCALC 53 09/14/2017 08:50 AM   LDLDIRECT 100.0 12/09/2014 08:27 AM    Wt Readings from Last 3 Encounters:  09/21/18 237 lb (107.5 kg)  09/12/18 244 lb (110.7 kg)  09/09/18 237 lb 14.4 oz (107.9 kg)     Objective:    Vital Signs:  BP (!) 152/96    Pulse (!) 59    Ht 5\' 5"  (1.651 m)    Wt 237 lb (107.5 kg)    BMI 39.44 kg/m    VS reviewed. General - stoic M in no acute distress Pulm - No labored breathing, no coughing during visit, no audible wheezing, speaking in full sentences Neuro - A+Ox3, no slurred speech, answers questions appropriately Psych - Reserved affect (requires several questions and urging of wife to elicit detalis about recent symptoms)    ASSESSMENT & PLAN:    1. Dyspnea on exertion/precordial chest pain - concerning for angina. Reviewed with Dr. Angelena Form. While it's possible his symptoms could be due to elevated BP, the pattern is concerning for ischemia given his prior history. It seems in the past that each time he's reported cardiac symptoms he was proven to have new disease. Therefore, per discussion with Dr. Angelena Form, plan heart cath when he is in the lab next for further evaluation. Given his dyspnea and longstanding tobacco abuse will make this a right and left heart cath. Risks and benefits of cardiac catheterization have been discussed with the patient.  These include bleeding, infection, kidney damage, stroke, heart attack, death.  The patient understands these risks and is willing to proceed. Offered earlier date in lab but pt prefers to keep with Dr. Angelena Form. Will also start amlodipine 5mg  daily not only for high BP but  also antianginal. He is already on Imdur and high dose atenolol. I did not think increasing his Imdur would impact his BP as much as it would need to, therefore chose to add amlodipine. RMA  went over pre-cath Covid testing and DM med protocol. I elected not to hold his atenolol/chlorthalidone pre-cath given the fact that he is on the combo pill and holding both could possibly result in some degree of hypertensive urgency post cath. Last dose of Xarelto will be Tuesday 7/7 for cath Thursday 7/9. Warning sx/ER precautions reviewed with patient. 2. CAD - as above. F/u cath results. Will also get lipid profile when he comes in for pre-cath labs. 3. Paroxysmal atrial fibrillation - was in NSR at recent OV. Continue BB and Xarelto except with instructions above pre-cath. 4. Resistant HTN - has been elevated recently in the hospital and today despite high dose atenlolol, chlorthalidone, max dose losartan, and Imdur. He reports significant daytime fatigue. Will arrange sleep study to evaluate sleep apnea. Will also get TSH, free T4 when he comes in for labs. If these are unrevealing and HTN persists will need to think about additional workup.  COVID-19 Education: The signs and symptoms of COVID-19 were discussed with the patient and how to seek care for testing (follow up with PCP or arrange E-visit). The importance of social distancing was discussed today.  Time:   Today, I have spent 20 minutes with the patient with telehealth technology discussing the above problems.     Medication Adjustments/Labs and Tests Ordered: Current medicines are reviewed at length with the patient today.  Concerns regarding medicines are outlined above.   Disposition:  Follow up 2 weeks after cath.  Signed, Charlie Pitter, PA-C  09/21/2018 11:16 AM    [place]

## 2018-09-20 ENCOUNTER — Telehealth: Payer: Self-pay | Admitting: Physician Assistant

## 2018-09-20 NOTE — Telephone Encounter (Signed)
New Message ° ° ° ° °Left message to confirm appt and get Consent  °

## 2018-09-21 ENCOUNTER — Encounter: Payer: Self-pay | Admitting: Physician Assistant

## 2018-09-21 ENCOUNTER — Telehealth: Payer: Self-pay | Admitting: *Deleted

## 2018-09-21 ENCOUNTER — Telehealth: Payer: Self-pay | Admitting: Physician Assistant

## 2018-09-21 ENCOUNTER — Telehealth: Payer: Self-pay

## 2018-09-21 ENCOUNTER — Telehealth (INDEPENDENT_AMBULATORY_CARE_PROVIDER_SITE_OTHER): Payer: 59 | Admitting: Physician Assistant

## 2018-09-21 ENCOUNTER — Other Ambulatory Visit: Payer: Self-pay

## 2018-09-21 VITALS — BP 152/96 | HR 59 | Ht 65.0 in | Wt 237.0 lb

## 2018-09-21 DIAGNOSIS — G4700 Insomnia, unspecified: Secondary | ICD-10-CM

## 2018-09-21 DIAGNOSIS — I480 Paroxysmal atrial fibrillation: Secondary | ICD-10-CM | POA: Diagnosis not present

## 2018-09-21 DIAGNOSIS — R072 Precordial pain: Secondary | ICD-10-CM | POA: Diagnosis not present

## 2018-09-21 DIAGNOSIS — R0683 Snoring: Secondary | ICD-10-CM

## 2018-09-21 DIAGNOSIS — I2510 Atherosclerotic heart disease of native coronary artery without angina pectoris: Secondary | ICD-10-CM

## 2018-09-21 DIAGNOSIS — R0600 Dyspnea, unspecified: Secondary | ICD-10-CM

## 2018-09-21 DIAGNOSIS — R0609 Other forms of dyspnea: Secondary | ICD-10-CM | POA: Diagnosis not present

## 2018-09-21 DIAGNOSIS — I10 Essential (primary) hypertension: Secondary | ICD-10-CM

## 2018-09-21 MED ORDER — AMLODIPINE BESYLATE 5 MG PO TABS: 5.0000 mg | ORAL_TABLET | Freq: Every day | ORAL | 3 refills | Status: DC

## 2018-09-21 MED ORDER — SODIUM CHLORIDE 0.9% FLUSH: 3.0000 mL | Freq: Two times a day (BID) | INTRAVENOUS | Status: DC

## 2018-09-21 NOTE — Telephone Encounter (Signed)

## 2018-09-21 NOTE — Telephone Encounter (Signed)
-----   Message from Jeanann Lewandowsky, [place] sent at 09/21/2018 11:47 AM EDT ----- Regarding: sleep study Pt needs a sleep study.  thanks

## 2018-09-21 NOTE — Telephone Encounter (Signed)
Left message to call office.  Letter routed to pt through my chart

## 2018-09-21 NOTE — Telephone Encounter (Signed)
I printed letter and the patient will pick up Monday when he comes for labs.

## 2018-09-21 NOTE — Telephone Encounter (Signed)
   Anderson Malta is off for the remainder of the day so will send to triage. At time of OV pt's wife had inquired about returning to work. I asked him to remain out of work until we evaluate his coronary disease. He has a cath scheduled 7/7. I am not physically in the office (working virtually) but did write a letter under Letters. Can you find out from pt/wife where you can fax that to? If it will suffice, can also print out for them to pick it up. Thanks, [employer]

## 2018-09-21 NOTE — Patient Instructions (Addendum)
Medication Instructions:  1.  START Amlodipine 5 mg taking 1 tablet daily.  This has been sent to your pharmacy  If you need a refill on your cardiac medications before your next appointment, please call your pharmacy.   Lab work: Monday, 09/25/2018:  BMET, CBC, TSH, FREE T4, & LIPID  If you have labs (blood work) drawn today and your tests are completely normal, you will receive your results only by: Marland Kitchen MyChart Message (if you have MyChart) OR . A paper copy in the mail If you have any lab test that is abnormal or we need to change your treatment, we will call you to review the results.  Testing/Procedures: Your physician has recommended that you have a sleep study.  SOMEONE WILL CALL YOU TO SCHEDULE THIS TEST.  This test records several body functions during sleep, including: brain activity, eye movement, oxygen and carbon dioxide blood levels, heart rate and rhythm, breathing rate and rhythm, the flow of air through your mouth and nose, snoring, body muscle movements, and chest and belly movement.   Your physician has requested that you have a cardiac catheterization. Cardiac catheterization is used to diagnose and/or treat various heart conditions. Doctors may recommend this procedure for a number of different reasons. The most common reason is to evaluate chest pain. Chest pain can be a symptom of coronary artery disease (CAD), and cardiac catheterization can show whether plaque is narrowing or blocking your heart's arteries. This procedure is also used to evaluate the valves, as well as measure the blood flow and oxygen levels in different parts of your heart. For further information please visit [email or web address]. Please follow instruction sheet, as BELOW:     [place] OFFICE [place], [place] [address] Dept: [phone] Loc: [phone]  Sergey Ishler Gazzola  09/21/2018  You are  scheduled for a Cardiac Catheterization on Thursday, July 9 with Dr. Lauree Chandler.  1. Please arrive at the [facility] (Main Entrance A) at [facility]: [address] [place], [place] [address] at 7:00 AM (This time is two hours before your procedure to ensure your preparation). Free valet parking service is available.   Special note: Every effort is made to have your procedure done on time. Please understand that emergencies sometimes delay scheduled procedures.  2. Diet: Do not eat solid foods after midnight.  The patient may have clear liquids until 5am upon the day of the procedure.  3. Labs: You will need to have blood drawn on Monday, July 6 at [facility] at [facility]. [address]  Open: 7:30am - 5pm    Phone: [phone]. You do not need to be fasting.  YOU WILL ALSO NEED TO BE PRE-SCREENED FOR COVID19 ON Monday, 09/25/2018. SEE BELOW:   Your Pre-procedure COVID-19 Testing will be done on 09/25/2018 at [place] at [address] [place], [place] [address] After your swab you will be given a mask to wear and instructed to go home and quarantine/no visitors until after your procedure. If you test positive you will be notified and your procedure will be cancelled.    4. Medication instructions in preparation for your procedure:   Contrast Allergy: No  Your last dose of Xarelto should be on Tuesday, July 7th. Do not take any July 8th or 9th. They will instruct you at the hospital when to resume it.  Do not take any insulin on the day of the procedure.  Do not take Diabetes Med Glucophage (Metformin) on the day of the procedure and HOLD 48 HOURS AFTER THE PROCEDURE.   Do not take Tradjenta on the morning of your procedure  On the morning of your procedure, take a baby Aspirin and any morning medicines NOT listed above.  You may use sips of water.  5. Plan for one night stay--bring  personal belongings. 6. Bring a current list of your medications and current insurance cards. 7. You MUST have a responsible person to drive you home. 8. Someone MUST be with you the first 24 hours after you arrive home or your discharge will be delayed. 9. Please wear clothes that are easy to get on and off and wear slip-on shoes.  Thank you for allowing [place] to care for you!   -- [facility]    Follow-Up: At [facility], you and your health needs are our priority.  As part of our continuing mission to provide you with exceptional heart care, we have created designated Provider Care Teams.  These Care Teams include your primary Cardiologist (physician) and Advanced Practice Providers (APPs -  Physician Assistants and Nurse Practitioners) who all work together to provide you with the care you need, when you need it. You will need a follow up appointment in 2 weeks.  10/11/2018 at 8:00 to see Melina Copa,   Any Other Special Instructions Will Be Listed Below (If Applicable).

## 2018-09-25 ENCOUNTER — Other Ambulatory Visit (HOSPITAL_COMMUNITY)
Admission: RE | Admit: 2018-09-25 | Discharge: 2018-09-25 | Disposition: A | Discharge status: Home / Self Care | Payer: 59 | Source: Ambulatory Visit | Attending: Cardiovascular Disease | Admitting: Cardiovascular Disease

## 2018-09-25 ENCOUNTER — Other Ambulatory Visit: Payer: 59 | Admitting: *Deleted

## 2018-09-25 ENCOUNTER — Other Ambulatory Visit: Payer: Self-pay

## 2018-09-25 DIAGNOSIS — Z01812 Encounter for preprocedural laboratory examination: Secondary | ICD-10-CM | POA: Insufficient documentation

## 2018-09-25 DIAGNOSIS — R0600 Dyspnea, unspecified: Secondary | ICD-10-CM

## 2018-09-25 DIAGNOSIS — I1A0 Resistant hypertension: Secondary | ICD-10-CM

## 2018-09-25 DIAGNOSIS — Z1159 Encounter for screening for other viral diseases: Secondary | ICD-10-CM | POA: Diagnosis not present

## 2018-09-25 DIAGNOSIS — I10 Essential (primary) hypertension: Secondary | ICD-10-CM

## 2018-09-25 DIAGNOSIS — I480 Paroxysmal atrial fibrillation: Secondary | ICD-10-CM

## 2018-09-25 DIAGNOSIS — R0609 Other forms of dyspnea: Secondary | ICD-10-CM

## 2018-09-25 DIAGNOSIS — R072 Precordial pain: Secondary | ICD-10-CM

## 2018-09-25 DIAGNOSIS — I2510 Atherosclerotic heart disease of native coronary artery without angina pectoris: Secondary | ICD-10-CM

## 2018-09-25 LAB — SARS CORONAVIRUS 2 (TAT 6-24 HRS): SARS Coronavirus 2: NEGATIVE

## 2018-09-26 ENCOUNTER — Telehealth: Payer: Self-pay

## 2018-09-26 ENCOUNTER — Telehealth: Payer: Self-pay | Admitting: *Deleted

## 2018-09-26 ENCOUNTER — Telehealth: Payer: Self-pay | Admitting: Cardiovascular Disease

## 2018-09-26 LAB — BASIC METABOLIC PANEL
BUN/Creatinine Ratio: 9 — ABNORMAL LOW (ref 10–24)
BUN: 9 mg/dL (ref 8–27)
CO2: 26 mmol/L (ref 20–29)
Calcium: 10 mg/dL (ref 8.6–10.2)
Chloride: 94 mmol/L — ABNORMAL LOW (ref 96–106)
Creatinine, Ser: 0.97 mg/dL (ref 0.76–1.27)
GFR calc Af Amer: 96 mL/min/{1.73_m2} (ref >59)
GFR calc non Af Amer: 83 mL/min/{1.73_m2} (ref >59)
Glucose: 224 mg/dL — ABNORMAL HIGH (ref 65–99)
Potassium: 3.4 mmol/L — ABNORMAL LOW (ref 3.5–5.2)
Sodium: 134 mmol/L (ref 134–144)

## 2018-09-26 LAB — LIPID PANEL
Chol/HDL Ratio: 3.5 ratio (ref 0.0–5.0)
Cholesterol, Total: 135 mg/dL (ref 100–199)
HDL: 39 mg/dL — ABNORMAL LOW (ref >39)
LDL Calculated: 45 mg/dL (ref 0–99)
Triglycerides: 255 mg/dL — ABNORMAL HIGH (ref 0–149)
VLDL Cholesterol Cal: 51 mg/dL — ABNORMAL HIGH (ref 5–40)

## 2018-09-26 LAB — CBC
Hematocrit: 48.3 % (ref 37.5–51.0)
Hemoglobin: 16.5 g/dL (ref 13.0–17.7)
MCH: 30.7 pg (ref 26.6–33.0)
MCHC: 34.2 g/dL (ref 31.5–35.7)
MCV: 90 fL (ref 79–97)
Platelets: 202 10*3/uL (ref 150–450)
RBC: 5.38 x10E6/uL (ref 4.14–5.80)
RDW: 12.2 % (ref 11.6–15.4)
WBC: 10.2 10*3/uL (ref 3.4–10.8)

## 2018-09-26 LAB — T4, FREE: Free T4: 1.72 ng/dL (ref 0.82–1.77)

## 2018-09-26 LAB — TSH: TSH: 1.1 u[IU]/mL (ref 0.450–4.500)

## 2018-09-26 MED ORDER — POTASSIUM CHLORIDE CRYS ER 20 MEQ PO TBCR: 20.0000 meq | EXTENDED_RELEASE_TABLET | Freq: Two times a day (BID) | ORAL | 11 refills | Status: DC

## 2018-09-26 NOTE — Telephone Encounter (Signed)
-----   Message from Jeanann Lewandowsky, [place] sent at 09/21/2018 11:47 AM EDT ----- Regarding: sleep study Pt needs a sleep study.  thanks

## 2018-09-26 NOTE — Telephone Encounter (Signed)
New Message       Patient's wife calling in due to the patient needing a letter stating that he has covid test done and it was negative and he now has to quarantine at home until he has his cath procedure on Thursday.

## 2018-09-26 NOTE — Telephone Encounter (Signed)
-----   Message from Charlie Pitter, [place] sent at 09/26/2018  7:59 AM EDT ----- Please let pt know pre-cath labs are acceptable for procedure. His blood sugar seems very high and subsequently triglycerides are also high indicating he needs much stricter control of his diabetes by working closely with whomever is managing this. Potassium level is on the low side. Make sure he is taking potassium as prescribed. If so, please increase KCl to 33meq BID - can also take 52meq daily all at once if he wishes. Please increase dietary intake of healthy sources of potassium including bananas, squash, yogurt, white beans, sweet potatoes, leafy greens, and avocados. Covid came back negative. Dayna Dunn PA-C

## 2018-09-26 NOTE — Telephone Encounter (Signed)
Returned call to pt's wife, Jackelyn Poling.  Left a detailed message that pt received a letter 09/21/2018 stating he was out of work until cleared by cardiology after his procedure 09/28/2018. If she needed anything else, to call back.

## 2018-09-26 NOTE — Telephone Encounter (Signed)
PA submitted to [facility] via web portal for sleep study. Pending Auth # [id number].

## 2018-09-26 NOTE — Telephone Encounter (Signed)
Reviewed the following instructions with the patient. Patient verbalized understanding and has no questions at this time.  Pt contacted pre-catheterization scheduled at [facility] for: 9:00 am on 09/28/18  Verified arrival time and place: [place] Entrance A at: 7:00 am  Covid-19 test date: 09/25/18  No solid food after midnight prior to cath, clear liquids until 5 AM day of procedure.  Contrast allergy:No  Verified no diabetes medications. Patient has diabetic medications  Patient has multiple diabetic medications and is instructed to hold all diabetic medications the morning of the procedure.  Patient instructed to take his plavix the morning of the procedure  Patient is instructed to hold his Xarelto 2 days before the procedure.  AM meds can be  taken pre-cath with sip of water including: ASA 81 mg  Confirmed patient has responsible person to drive home post procedure and observe 24 hours after arriving home: Patient has a responsible person that can drive him home after the procedure and observe him for 24 hours.      COVID-19 Pre-Screening Questions:  . In the past 7 to 10 days have you had a cough,  shortness of breath, headache, congestion, fever (100 or greater) body aches, chills, sore throat, or sudden loss of taste or sense of smell? No . Have you been around anyone with known Covid 19. No . Have you been around anyone who is awaiting Covid 19 test results in the past 7 to 10 days? No . Have you been around anyone who has been exposed to Covid 19, or has mentioned symptoms of Covid 19 within the past 7 to 10 days? No  If you have any concerns/questions about symptoms patients report during screening (either on the phone or at threshold). Contact the provider seeing the patient or DOD for further guidance.  If neither are available contact a member of the leadership team.

## 2018-09-28 ENCOUNTER — Other Ambulatory Visit: Payer: Self-pay

## 2018-09-28 ENCOUNTER — Ambulatory Visit (HOSPITAL_COMMUNITY)
Admission: RE | Admit: 2018-09-28 | Discharge: 2018-09-28 | Disposition: A | Discharge status: Home / Self Care | Payer: 59 | Attending: Cardiovascular Disease | Admitting: Cardiovascular Disease

## 2018-09-28 ENCOUNTER — Encounter (HOSPITAL_COMMUNITY)
Admission: RE | Disposition: A | Discharge status: Home / Self Care | Payer: 59 | Source: Home / Self Care | Attending: Cardiovascular Disease

## 2018-09-28 ENCOUNTER — Encounter (HOSPITAL_COMMUNITY): Payer: Self-pay | Admitting: Cardiovascular Disease

## 2018-09-28 DIAGNOSIS — Z6839 Body mass index (BMI) 39.0-39.9, adult: Secondary | ICD-10-CM | POA: Diagnosis not present

## 2018-09-28 DIAGNOSIS — M1990 Unspecified osteoarthritis, unspecified site: Secondary | ICD-10-CM | POA: Insufficient documentation

## 2018-09-28 DIAGNOSIS — Z955 Presence of coronary angioplasty implant and graft: Secondary | ICD-10-CM | POA: Diagnosis not present

## 2018-09-28 DIAGNOSIS — Z7902 Long term (current) use of antithrombotics/antiplatelets: Secondary | ICD-10-CM | POA: Insufficient documentation

## 2018-09-28 DIAGNOSIS — E785 Hyperlipidemia, unspecified: Secondary | ICD-10-CM | POA: Insufficient documentation

## 2018-09-28 DIAGNOSIS — E039 Hypothyroidism, unspecified: Secondary | ICD-10-CM | POA: Insufficient documentation

## 2018-09-28 DIAGNOSIS — I480 Paroxysmal atrial fibrillation: Secondary | ICD-10-CM | POA: Insufficient documentation

## 2018-09-28 DIAGNOSIS — E669 Obesity, unspecified: Secondary | ICD-10-CM | POA: Insufficient documentation

## 2018-09-28 DIAGNOSIS — E119 Type 2 diabetes mellitus without complications: Secondary | ICD-10-CM | POA: Insufficient documentation

## 2018-09-28 DIAGNOSIS — I10 Essential (primary) hypertension: Secondary | ICD-10-CM | POA: Diagnosis not present

## 2018-09-28 DIAGNOSIS — I2510 Atherosclerotic heart disease of native coronary artery without angina pectoris: Secondary | ICD-10-CM | POA: Insufficient documentation

## 2018-09-28 DIAGNOSIS — Z7901 Long term (current) use of anticoagulants: Secondary | ICD-10-CM | POA: Diagnosis not present

## 2018-09-28 DIAGNOSIS — F17210 Nicotine dependence, cigarettes, uncomplicated: Secondary | ICD-10-CM | POA: Insufficient documentation

## 2018-09-28 DIAGNOSIS — R0609 Other forms of dyspnea: Secondary | ICD-10-CM

## 2018-09-28 DIAGNOSIS — Z888 Allergy status to other drugs, medicaments and biological substances status: Secondary | ICD-10-CM | POA: Insufficient documentation

## 2018-09-28 DIAGNOSIS — Z823 Family history of stroke: Secondary | ICD-10-CM | POA: Insufficient documentation

## 2018-09-28 DIAGNOSIS — Z79899 Other long term (current) drug therapy: Secondary | ICD-10-CM | POA: Insufficient documentation

## 2018-09-28 DIAGNOSIS — R0600 Dyspnea, unspecified: Secondary | ICD-10-CM

## 2018-09-28 DIAGNOSIS — Z79890 Hormone replacement therapy (postmenopausal): Secondary | ICD-10-CM | POA: Diagnosis not present

## 2018-09-28 DIAGNOSIS — Z8249 Family history of ischemic heart disease and other diseases of the circulatory system: Secondary | ICD-10-CM | POA: Diagnosis not present

## 2018-09-28 DIAGNOSIS — Z794 Long term (current) use of insulin: Secondary | ICD-10-CM | POA: Diagnosis not present

## 2018-09-28 HISTORY — PX: RIGHT/LEFT HEART CATH AND CORONARY ANGIOGRAPHY: CATH118266

## 2018-09-28 LAB — POCT I-STAT EG7
Acid-Base Excess: 1 mmol/L (ref 0.0–2.0)
Acid-Base Excess: 1 mmol/L (ref 0.0–2.0)
Bicarbonate: 28.7 mmol/L — ABNORMAL HIGH (ref 20.0–28.0)
Bicarbonate: 28.5 mmol/L — ABNORMAL HIGH (ref 20.0–28.0)
Calcium, Ion: 1.28 mmol/L (ref 1.15–1.40)
Calcium, Ion: 1.24 mmol/L (ref 1.15–1.40)
HCT: 44 % (ref 39.0–52.0)
HCT: 44 % (ref 39.0–52.0)
Hemoglobin: 15 g/dL (ref 13.0–17.0)
Hemoglobin: 15 g/dL (ref 13.0–17.0)
O2 Saturation: 66 %
O2 Saturation: 64 %
Potassium: 3.6 mmol/L (ref 3.5–5.1)
Potassium: 3.4 mmol/L — ABNORMAL LOW (ref 3.5–5.1)
Sodium: 139 mmol/L (ref 135–145)
Sodium: 140 mmol/L (ref 135–145)
TCO2: 30 mmol/L (ref 22–32)
TCO2: 30 mmol/L (ref 22–32)
pCO2, Ven: 55.1 mmHg (ref 44.0–60.0)
pCO2, Ven: 53.5 mmHg (ref 44.0–60.0)
pH, Ven: 7.325 (ref 7.250–7.430)
pH, Ven: 7.335 (ref 7.250–7.430)
pO2, Ven: 38 mmHg (ref 32.0–45.0)
pO2, Ven: 36 mmHg (ref 32.0–45.0)

## 2018-09-28 LAB — POCT I-STAT 7, (LYTES, BLD GAS, ICA,H+H)
Bicarbonate: 26.1 mmol/L (ref 20.0–28.0)
Calcium, Ion: 1.23 mmol/L (ref 1.15–1.40)
HCT: 44 % (ref 39.0–52.0)
Hemoglobin: 15 g/dL (ref 13.0–17.0)
O2 Saturation: 95 %
Potassium: 3.4 mmol/L — ABNORMAL LOW (ref 3.5–5.1)
Sodium: 140 mmol/L (ref 135–145)
TCO2: 27 mmol/L (ref 22–32)
pCO2 arterial: 46.3 mmHg (ref 32.0–48.0)
pH, Arterial: 7.359 (ref 7.350–7.450)
pO2, Arterial: 79 mmHg — ABNORMAL LOW (ref 83.0–108.0)

## 2018-09-28 LAB — GLUCOSE, CAPILLARY: Glucose-Capillary: 211 mg/dL — ABNORMAL HIGH (ref 70–99)

## 2018-09-28 LAB — POTASSIUM: Potassium: 3.6 mmol/L (ref 3.5–5.1)

## 2018-09-28 SURGERY — RIGHT/LEFT HEART CATH AND CORONARY ANGIOGRAPHY
Anesthesia: LOCAL

## 2018-09-28 MED ORDER — FENTANYL CITRATE (PF) 100 MCG/2ML IJ SOLN
INTRAMUSCULAR | Status: DC | PRN
  Administered 2018-09-28: 50 ug via INTRAVENOUS

## 2018-09-28 MED ORDER — VERAPAMIL HCL 2.5 MG/ML IV SOLN
INTRAVENOUS | Status: DC | PRN
  Administered 2018-09-28: 10 mL via INTRA_ARTERIAL

## 2018-09-28 MED ORDER — ASPIRIN 81 MG PO CHEW: 81.0000 mg | CHEWABLE_TABLET | ORAL | Status: DC

## 2018-09-28 MED ORDER — ONDANSETRON HCL 4 MG/2ML IJ SOLN: 4.0000 mg | Freq: Four times a day (QID) | INTRAMUSCULAR | Status: DC | PRN

## 2018-09-28 MED ORDER — HEPARIN (PORCINE) IN NACL 1000-0.9 UT/500ML-% IV SOLN
INTRAVENOUS | Status: DC | PRN
  Administered 2018-09-28: 500 mL

## 2018-09-28 MED ORDER — SODIUM CHLORIDE 0.9% FLUSH: 3.0000 mL | Freq: Two times a day (BID) | INTRAVENOUS | Status: DC

## 2018-09-28 MED ORDER — MIDAZOLAM HCL 2 MG/2ML IJ SOLN
INTRAMUSCULAR | Status: AC
  Filled 2018-09-28: qty 2

## 2018-09-28 MED ORDER — SODIUM CHLORIDE 0.9 % IV SOLN: INTRAVENOUS | Status: AC

## 2018-09-28 MED ORDER — LIDOCAINE HCL (PF) 1 % IJ SOLN
INTRAMUSCULAR | Status: AC
  Filled 2018-09-28: qty 30

## 2018-09-28 MED ORDER — VERAPAMIL HCL 2.5 MG/ML IV SOLN
INTRAVENOUS | Status: AC
  Filled 2018-09-28: qty 2

## 2018-09-28 MED ORDER — SODIUM CHLORIDE 0.9 % IV SOLN: 250.0000 mL | INTRAVENOUS | Status: DC | PRN

## 2018-09-28 MED ORDER — SODIUM CHLORIDE 0.9 % WEIGHT BASED INFUSION: 1.0000 mL/kg/h | INTRAVENOUS | Status: DC

## 2018-09-28 MED ORDER — LIDOCAINE HCL (PF) 1 % IJ SOLN
INTRAMUSCULAR | Status: DC | PRN
  Administered 2018-09-28 (×2): 2 mL

## 2018-09-28 MED ORDER — SODIUM CHLORIDE 0.9% FLUSH: 3.0000 mL | INTRAVENOUS | Status: DC | PRN

## 2018-09-28 MED ORDER — HEPARIN SODIUM (PORCINE) 1000 UNIT/ML IJ SOLN
INTRAMUSCULAR | Status: AC
  Filled 2018-09-28: qty 1

## 2018-09-28 MED ORDER — MIDAZOLAM HCL 2 MG/2ML IJ SOLN
INTRAMUSCULAR | Status: DC | PRN
  Administered 2018-09-28: 2 mg via INTRAVENOUS

## 2018-09-28 MED ORDER — HEPARIN (PORCINE) IN NACL 1000-0.9 UT/500ML-% IV SOLN
INTRAVENOUS | Status: AC
  Filled 2018-09-28: qty 1000

## 2018-09-28 MED ORDER — LABETALOL HCL 5 MG/ML IV SOLN: 10.0000 mg | INTRAVENOUS | Status: DC | PRN

## 2018-09-28 MED ORDER — HYDRALAZINE HCL 20 MG/ML IJ SOLN: 10.0000 mg | INTRAMUSCULAR | Status: DC | PRN

## 2018-09-28 MED ORDER — ACETAMINOPHEN 325 MG PO TABS: 650.0000 mg | ORAL_TABLET | ORAL | Status: DC | PRN

## 2018-09-28 MED ORDER — SODIUM CHLORIDE 0.9 % WEIGHT BASED INFUSION
3.0000 mL/kg/h | INTRAVENOUS | Status: AC
  Administered 2018-09-28: 3 mL/kg/h via INTRAVENOUS

## 2018-09-28 MED ORDER — FENTANYL CITRATE (PF) 100 MCG/2ML IJ SOLN
INTRAMUSCULAR | Status: AC
  Filled 2018-09-28: qty 2

## 2018-09-28 MED ORDER — HEPARIN SODIUM (PORCINE) 1000 UNIT/ML IJ SOLN
INTRAMUSCULAR | Status: DC | PRN
  Administered 2018-09-28: 5500 [IU] via INTRAVENOUS

## 2018-09-28 MED ORDER — IOHEXOL 350 MG/ML SOLN
INTRAVENOUS | Status: DC | PRN
  Administered 2018-09-28: 65 mL via INTRA_ARTERIAL

## 2018-09-28 SURGICAL SUPPLY — 14 items
CATH 5FR JL3.5 JR4 ANG PIG MP (CATHETERS) ×1 IMPLANT
CATH BALLN WEDGE 5F 110CM (CATHETERS) ×1 IMPLANT
COVER DOME SNAP 22 D (MISCELLANEOUS) ×1 IMPLANT
DEVICE RAD COMP TR BAND LRG (VASCULAR PRODUCTS) ×1 IMPLANT
ELECT DEFIB PAD ADLT CADENCE (PAD) ×1 IMPLANT
GLIDESHEATH SLEND SS 6F .021 (SHEATH) ×1 IMPLANT
GUIDEWIRE .025 260CM (WIRE) ×1 IMPLANT
GUIDEWIRE INQWIRE 1.5J.035X260 (WIRE) IMPLANT
INQWIRE 1.5J .035X260CM (WIRE) ×2
KIT HEART LEFT (KITS) ×2 IMPLANT
PACK CARDIAC CATHETERIZATION (CUSTOM PROCEDURE TRAY) ×2 IMPLANT
SHEATH GLIDE SLENDER 4/5FR (SHEATH) ×1 IMPLANT
TRANSDUCER W/STOPCOCK (MISCELLANEOUS) ×2 IMPLANT
TUBING CIL FLEX 10 FLL-RA (TUBING) ×2 IMPLANT

## 2018-09-28 NOTE — Progress Notes (Addendum)
Discharge instructions reviewed with pt (via telephone) and pt. Voices understanding. Instructed pt to hold metformin for 48 hours voices understanding.

## 2018-09-28 NOTE — Interval H&P Note (Signed)
History and Physical Interval Note:  09/28/2018 7:38 AM  Anthony Woods  has presented today for surgery, with the diagnosis of Shortness of breath.  The various methods of treatment have been discussed with the patient and family. After consideration of risks, benefits and other options for treatment, the patient has consented to  Procedure(s): RIGHT/LEFT HEART CATH AND CORONARY ANGIOGRAPHY (N/A) as a surgical intervention.  The patient's history has been reviewed, patient examined, no change in status, stable for surgery.  I have reviewed the patient's chart and labs.  Questions were answered to the patient's satisfaction.    Cath Lab Visit (complete for each Cath Lab visit)  Clinical Evaluation Leading to the Procedure:   ACS: No.  Non-ACS:    Anginal Classification: CCS III  Anti-ischemic medical therapy: Maximal Therapy (2 or more classes of medications)  Non-Invasive Test Results: No non-invasive testing performed  Prior CABG: No previous CABG         Lauree Chandler

## 2018-09-28 NOTE — Discharge Instructions (Signed)
Drink plenty of fluids  °Keep right arm at or above heart level.  °Radial Site Care ° °This sheet gives you information about how to care for yourself after your procedure. Your health care provider may also give you more specific instructions. If you have problems or questions, contact your health care provider. °What can I expect after the procedure? °After the procedure, it is common to have: °· Bruising and tenderness at the catheter insertion area. °Follow these instructions at home: °Medicines °· Take over-the-counter and prescription medicines only as told by your health care provider. °Insertion site care °· Follow instructions from your health care provider about how to take care of your insertion site. Make sure you: °? Wash your hands with soap and water before you change your bandage (dressing). If soap and water are not available, use hand sanitizer. °? Change your dressing as told by your health care provider. °? Leave stitches (sutures), skin glue, or adhesive strips in place. These skin closures may need to stay in place for 2 weeks or longer. If adhesive strip edges start to loosen and curl up, you may trim the loose edges. Do not remove adhesive strips completely unless your health care provider tells you to do that. °· Check your insertion site every day for signs of infection. Check for: °? Redness, swelling, or pain. °? Fluid or blood. °? Pus or a bad smell. °? Warmth. °· Do not take baths, swim, or use a hot tub until your health care provider approves. °· You may shower 24-48 hours after the procedure, or as directed by your health care provider. °? Remove the dressing and gently wash the site with plain soap and water. °? Pat the area dry with a clean towel. °? Do not rub the site. That could cause bleeding. °· Do not apply powder or lotion to the site. °Activity ° °· For 24 hours after the procedure, or as directed by your health care provider: °? Do not flex or bend the affected arm. °? Do  not push or pull heavy objects with the affected arm. °? Do not drive yourself home from the hospital or clinic. You may drive 24 hours after the procedure unless your health care provider tells you not to. °? Do not operate machinery or power tools. °· Do not lift anything that is heavier than 10 lb (4.5 kg), or the limit that you are told, until your health care provider says that it is safe. °· Ask your health care provider when it is okay to: °? Return to work or school. °? Resume usual physical activities or sports. °? Resume sexual activity. °General instructions °· If the catheter site starts to bleed, raise your arm and put firm pressure on the site. If the bleeding does not stop, get help right away. This is a medical emergency. °· If you went home on the same day as your procedure, a responsible adult should be with you for the first 24 hours after you arrive home. °· Keep all follow-up visits as told by your health care provider. This is important. °Contact a health care provider if: °· You have a fever. °· You have redness, swelling, or yellow drainage around your insertion site. °Get help right away if: °· You have unusual pain at the radial site. °· The catheter insertion area swells very fast. °· The insertion area is bleeding, and the bleeding does not stop when you hold steady pressure on the area. °· Your arm or   becomes pale, cool, tingly, or numb. These symptoms may represent a serious problem that is an emergency. Do not wait to see if the symptoms will go away. Get medical help right away. Call your local emergency services (911 in the [place] Do not drive yourself to the hospital. Summary  After the procedure, it is common to have bruising and tenderness at the site.  Follow instructions from your health care provider about how to take care of your radial site wound. Check the wound every day for signs of infection.  Do not lift anything that is heavier than 10 lb (4.5 kg), or the  limit that you are told, until your health care provider says that it is safe. This information is not intended to replace advice given to you by your health care provider. Make sure you discuss any questions you have with your health care provider. Document Released: 04/10/2010 Document Revised: 04/13/2017 Document Reviewed: 04/13/2017 Elsevier Patient Education  [place]. Hold Metformin 48 hours post cath

## 2018-10-08 ENCOUNTER — Encounter: Payer: Self-pay | Admitting: Physician Assistant

## 2018-10-08 NOTE — Progress Notes (Signed)
Cardiology Office Note    Date:  10/11/2018  ID:  Anthony Woods, DOB [birth year], MRN [id number] PCP:  Cassandria Anger, MD  Cardiologist:  Lauree Chandler, MD   Chief Complaint: f/u after cath  History of Present Illness:  Anthony Woods is a 63 y.o. male with history of paroxsmal atrial fibrillation, CAD (DES to LCx 04/2016, DES to prox Cx 08/2017), HTN, DM, obesity, bradycardia and tobacco abuse who is here today post-cath follow-up.  He has history outlined above. He was admitted to [facility] February 2018 with chest pain and was found to be in atrial fibrillation. Cardiac cath February 2018 showed severe stenosis in the circumflex treated with a Synergy drug eluting stent. He was discharged on Plavix and Xarelto. He developed recurrent chest pain in May-June of 2019. Cardiac cath 08/31/17 showed severe stenosis in the proximal circumflex treated with DES with otherwise nonobstructive disease and patent Cx stent. He was discharged on ASA (1 month) + Plavix + Xarelto. He had normal ABIs in July 2019. He was recently admitted in 08/2018 with chest pain and r/o for MI. 2D echo 09/08/18 showed EF 60-65%, normal RV, mild LAE, no significant valvular disease. He was seen in virtual f/u 09/21/18 still reporting exertional SOB as well as atypical episodic chest discomfort. He also reported increased fatigue/sleeping a lot during the day. Sleep study was ordered. Amlodipine was added for high BP. He underwent cath 09/28/18 showing 20% mLAD, 40% mRCA, patent prior Cx stents, normal filling pressures. His dyspnea was felt likely due to obesity, deconditioning and ongoing tobacco abuse. Last labs 09/2018 showed K 3.4 (KCl increased), Na 134, Cr 0.97, normal CBC, LDL 45, triglycerides 255, thyroid normal. 08/2018 A1C 9.3. F/u K on 7/9 was 3.6.  He returns for follow-up overall feeling stable. He does not report chest discomfort today. His biggest complaint remains generalized fatigue. He spokes about 1 ppd total but  burns through about 40 cigarettes a day. He is in the contemplation stage of quitting but does not yet have an action plan. Sleep study does not appear scheduled yet. He does not follow BP at home.   Past Medical History:  Diagnosis Date   Allergy to bee sting    Anxiety    Arthritis    "right knee" (05/12/2016)   CAD (coronary artery disease)    a. DES to LCx 04/2016. b. DES to prox Cx 08/2017. c. Stable cath 09/2018 with mild nonobstructive residual disease, normal EF.   Headache    "when his sugar goes too high or too low" (05/12/2016)   HTN (hypertension)    Hyperlipidemia    Hypothyroidism    Macular degeneration    Obesity    PAF (paroxysmal atrial fibrillation) ([place])    Type II diabetes mellitus ([place])     Past Surgical History:  Procedure Laterality Date   CARDIAC CATHETERIZATION     COLONOSCOPY     CORONARY ANGIOPLASTY WITH STENT PLACEMENT  05/12/2016   CORONARY STENT INTERVENTION N/A 05/12/2016   Procedure: Coronary Stent Intervention;  Surgeon: Jettie Booze, MD;  Location: [place] [facility];  Service: Cardiovascular;  Laterality: N/A;   CORONARY STENT INTERVENTION N/A 08/31/2017   Procedure: CORONARY STENT INTERVENTION;  Surgeon: Jettie Booze, MD;  Location: [place] [facility];  Service: Cardiovascular;  Laterality: N/A;   GANGLION CYST EXCISION Left 1980s   KNEE ARTHROSCOPY Right    Meniscus removal   LEFT HEART CATH AND CORONARY ANGIOGRAPHY N/A 05/12/2016  Procedure: Left Heart Cath and Coronary Angiography;  Surgeon: Jettie Booze, MD;  Location: [place] [facility];  Service: Cardiovascular;  Laterality: N/A;   LEFT HEART CATH AND CORONARY ANGIOGRAPHY N/A 08/31/2017   Procedure: LEFT HEART CATH AND CORONARY ANGIOGRAPHY;  Surgeon: Jettie Booze, MD;  Location: [place] [facility];  Service: Cardiovascular;  Laterality: N/A;   LEFT HEART CATHETERIZATION WITH CORONARY ANGIOGRAM N/A 02/04/2012   Procedure: LEFT HEART  CATHETERIZATION WITH CORONARY ANGIOGRAM;  Surgeon: Burnell Blanks, MD;  Location: [facility];  Service: Cardiovascular;  Laterality: N/A;   POLYPECTOMY     RIGHT/LEFT HEART CATH AND CORONARY ANGIOGRAPHY N/A 09/28/2018   Procedure: RIGHT/LEFT HEART CATH AND CORONARY ANGIOGRAPHY;  Surgeon: Burnell Blanks, MD;  Location: [place] [facility];  Service: Cardiovascular;  Laterality: N/A;    Current Medications: Current Meds  Medication Sig   acetaminophen (TYLENOL) 500 MG tablet Take 1 tablet (500 mg total) by mouth every 6 (six) hours as needed.   amLODipine (NORVASC) 5 MG tablet Take 1 tablet (5 mg total) by mouth daily.   atenolol-chlorthalidone (TENORETIC) 100-25 MG tablet TAKE 1 TABLET BY MOUTH DAILY.   clopidogrel (PLAVIX) 75 MG tablet Take 75 mg by mouth daily.   diphenhydrAMINE (BENADRYL) 25 mg capsule Take 25 mg by mouth as needed (bee stings).   EPINEPHrine 0.3 mg/0.3 mL IJ SOAJ injection Inject 0.3 mLs (0.3 mg total) into the muscle as needed for anaphylaxis.   escitalopram (LEXAPRO) 10 MG tablet Take 1 tablet (10 mg total) by mouth daily.   insulin lispro protamine-lispro (HUMALOG 75/25 MIX) (75-25) 100 UNIT/ML SUSP injection Inject 60 Units into the skin daily.   Insulin Pen Needle (ULTICARE MICRO PEN NEEDLES) 32G X 4 MM MISC use with insulin pen to inject insulin 2x daily.   isosorbide mononitrate (IMDUR) 30 MG 24 hr tablet TAKE 1 TABLET BY MOUTH DAILY.   levothyroxine (SYNTHROID, LEVOTHROID) 175 MCG tablet TAKE 1 TABLET BY MOUTH DAILY.   linagliptin (TRADJENTA) 5 MG TABS tablet Take 1 tablet (5 mg total) by mouth daily.   losartan (COZAAR) 100 MG tablet TAKE 1 TABLET BY MOUTH DAILY.   metFORMIN (GLUCOPHAGE) 1000 MG tablet Take 1 tablet (1,000 mg total) by mouth 2 (two) times daily with a meal.   nitroGLYCERIN (NITROSTAT) 0.4 MG SL tablet Place 1 tablet (0.4 mg total) under the tongue every 5 (five) minutes as needed for chest pain.   OZEMPIC, 0.25 OR  0.5 MG/DOSE, 2 MG/1.5ML SOPN Inject 0.5 mg into the skin every Monday.    potassium chloride SA (K-DUR) 20 MEQ tablet Take 1 tablet (20 mEq total) by mouth 2 (two) times daily.   predniSONE (DELTASONE) 10 MG tablet Take 10 mg by mouth daily as needed (if stung by bee).   rosuvastatin (CRESTOR) 20 MG tablet TAKE 1 TABLET BY MOUTH AT BEDTIME.   XARELTO 20 MG TABS tablet TAKE 1 TABLET BY MOUTH DAILY.   Current Facility-Administered Medications for the 10/11/18 encounter (Office Visit) with Charlie Pitter, PA-C  Medication   sodium chloride flush (NS) 0.9 % injection 3 mL      Allergies:   Bee venom, Actos [pioglitazone hydrochloride], Lipitor [atorvastatin], and Lisinopril   Social History   Socioeconomic History   Marital status: Married    Spouse name: Not on file   Number of children: 1   Years of education: Not on file   Highest education level: Not on file  Occupational History  Occupation: [employer]   Occupation: GRAVE DIGGER    Employer: [employer] resource strain: Not on file   Food insecurity    Worry: Not on file    Inability: Not on file   Transportation needs    Medical: Not on file    Non-medical: Not on file  Tobacco Use   Smoking status: Current Every Day Smoker    Packs/day: 1.50    Years: 42.00    Pack years: 63.00    Types: Cigarettes   Smokeless tobacco: Never Used  Substance and Sexual Activity   Alcohol use: No   Drug use: No   Sexual activity: Not Currently  Lifestyle   Physical activity    Days per week: Not on file    Minutes per session: Not on file   Stress: Not on file  Relationships   Social connections    Talks on phone: Not on file    Gets together: Not on file    Attends religious service: Not on file    Active member of club or organization: Not on file    Attends meetings of clubs or organizations: Not on file    Relationship status: Not on file  Other Topics Concern    Not on file  Social History Narrative   Not on file     Family History:  The patient's family history includes Breast cancer in his sister; CAD in his maternal grandmother; CVA in his father; Cancer in his father and mother; Colon cancer (age of onset: 42) in his mother; Diabetes in his father, mother, and sister; Stomach cancer in his father. There is no history of Esophageal cancer or Rectal cancer.  ROS:   Please see the history of present illness. + nocturnal urination  All other systems are reviewed and otherwise negative.    PHYSICAL EXAM:   VS:  BP 136/60    Pulse 63    Ht 5\' 5"  (1.651 m)    Wt 247 lb (112 kg)    SpO2 96%    BMI 41.10 kg/m   BMI: Body mass index is 41.1 kg/m. GEN: Well nourished, well developed obese WM, in no acute distress HEENT: normocephalic, atraumatic Neck: no JVD, carotid bruits, or masses Cardiac: RRR; no murmurs, rubs, or gallops, no edema  Respiratory:  clear to auscultation bilaterally, normal work of breathing GI: soft, nontender, nondistended, + BS MS: no deformity or atrophy Skin: warm and dry, no rash. Right AC and radial cath site without hematoma or ecchymosis; good pulse. Neuro:  Alert and Oriented x 3, Strength and sensation are intact, follows commands Psych: euthymic mood, full affect  Wt Readings from Last 3 Encounters:  10/11/18 247 lb (112 kg)  09/28/18 240 lb (108.9 kg)  09/21/18 237 lb (107.5 kg)     Studies/Labs Reviewed:   EKG:   EKG was not ordered today  Recent Labs: 09/08/2018: B Natriuretic Peptide 141.1 09/25/2018: BUN 9; Creatinine, Ser 0.97; Platelets 202; TSH 1.100 09/28/2018: Hemoglobin 15.0; Hemoglobin 15.0; Potassium 3.6; Potassium 3.4; Sodium 139; Sodium 140   Lipid Panel    Component Value Date/Time   CHOL 135 09/25/2018 0801   TRIG 255 (H) 09/25/2018 0801   TRIG 219 (HH) 02/08/2006 0757   HDL 39 (L) 09/25/2018 0801   CHOLHDL 3.5 09/25/2018 0801   CHOLHDL 4 09/14/2017 0850   VLDL 37.4 09/14/2017 0850    LDLCALC 45 09/25/2018 0801   LDLDIRECT 100.0  12/09/2014 0827    Additional studies/ records that were reviewed today include: Summarized above   ASSESSMENT & PLAN:   1. CAD with recent DOE/chest discomfort - symptoms do not appear to be coronary in etiology. Cath stable. Rhythm stable. Continue medical therapy. I will reach out to Dr. Angelena Form to clarify duration of Plavix since he is also on Xarelto. Discussed importance of tobacco cessation. I offered to refer him to pulmonology but he declined at this time. 2. Paroxysmal atrial fib - maintaining NSR. Continue Xarelto. No bleeding reported. Hgb recently stable. 3. Essential HTN - BP recently elevated; amlodipine added. This seems to be improving. Discussed goal BP <130/80. The patient was instructed to monitor their blood pressure at home and to call if tending to run higher than 832 systolic at which time I would recommend increasing amlodipine to 10mg  daily. I do have a strong suspicion for OSA, however, given his HTN despite numerous agents, h/o PAF, obesity and generalized fatigue. I recommended he keep f/u for this. 4. Fatigue - recent Hgb, TSH wnl. Recommend sleep study as previously ordered. F/u primary care otherwise.  Disposition: F/u with Dr. Angelena Form in 6 months.  Medication Adjustments/Labs and Tests Ordered: Current medicines are reviewed at length with the patient today.  Concerns regarding medicines are outlined above. Medication changes, Labs and Tests ordered today are summarized above and listed in the Patient Instructions accessible in Encounters.   Signed, Charlie Pitter, PA-C  10/11/2018 8:23 AM    [address], [place], [place]  [id number] Phone: [phone]; Fax: [phone]

## 2018-10-10 ENCOUNTER — Telehealth: Payer: Self-pay | Admitting: Physician Assistant

## 2018-10-10 NOTE — Telephone Encounter (Signed)

## 2018-10-10 NOTE — Telephone Encounter (Signed)
New Message ° ° ° °Left message to confirm appt and answer covid questions  °

## 2018-10-10 NOTE — Telephone Encounter (Addendum)
Patient wife returned your call. She said to call him at work.

## 2018-10-11 ENCOUNTER — Encounter: Payer: Self-pay | Admitting: Physician Assistant

## 2018-10-11 ENCOUNTER — Ambulatory Visit (INDEPENDENT_AMBULATORY_CARE_PROVIDER_SITE_OTHER): Payer: 59 | Admitting: Physician Assistant

## 2018-10-11 ENCOUNTER — Other Ambulatory Visit: Payer: Self-pay

## 2018-10-11 ENCOUNTER — Telehealth: Payer: Self-pay | Admitting: Physician Assistant

## 2018-10-11 ENCOUNTER — Telehealth: Payer: Self-pay | Admitting: *Deleted

## 2018-10-11 VITALS — BP 136/60 | HR 63 | Ht 65.0 in | Wt 247.0 lb

## 2018-10-11 DIAGNOSIS — I10 Essential (primary) hypertension: Secondary | ICD-10-CM

## 2018-10-11 DIAGNOSIS — I480 Paroxysmal atrial fibrillation: Secondary | ICD-10-CM | POA: Diagnosis not present

## 2018-10-11 DIAGNOSIS — I2510 Atherosclerotic heart disease of native coronary artery without angina pectoris: Secondary | ICD-10-CM | POA: Diagnosis not present

## 2018-10-11 DIAGNOSIS — R5383 Other fatigue: Secondary | ICD-10-CM | POA: Diagnosis not present

## 2018-10-11 MED ORDER — ASPIRIN EC 81 MG PO TBEC: 81.0000 mg | DELAYED_RELEASE_TABLET | Freq: Every day | ORAL | 3 refills | Status: DC

## 2018-10-11 NOTE — Patient Instructions (Addendum)
Medication Instructions:  Your physician recommends that you continue on your current medications as directed. Please refer to the Current Medication list given to you today.  If you need a refill on your cardiac medications before your next appointment, please call your pharmacy.   Lab work: None ordered  If you have labs (blood work) drawn today and your tests are completely normal, you will receive your results only by: Marland Kitchen MyChart Message (if you have MyChart) OR . A paper copy in the mail If you have any lab test that is abnormal or we need to change your treatment, we will call you to review the results.  Testing/Procedures: None ordered  Follow-Up: At [facility], you and your health needs are our priority.  As part of our continuing mission to provide you with exceptional heart care, we have created designated Provider Care Teams.  These Care Teams include your primary Cardiologist (physician) and Advanced Practice Providers (APPs -  Physician Assistants and Nurse Practitioners) who all work together to provide you with the care you need, when you need it. You will need a follow up appointment in 6 months.  Please call our office 2 months in advance to schedule this appointment.  You may see Lauree Chandler, MD or one of the following Advanced Practice Providers on your designated Care Team:   [place], PA-C Melina Copa, PA-C . Ermalinda Barrios, PA-C  Any Other Special Instructions Will Be Listed Below (If Applicable).  To check your blood pressure, choose a time about 3 hours after taking your blood pressure medicines. Remain seated in a chair for 5 minutes quietly beforehand, then check it. Please monitor your blood pressure occasionally at home. Call our office if you tend to get readings of greater than 130 on the top number or 80 on the bottom number.  Continue to work hard on quitting smoking. It will be the most important thing you can do for your health at this  point.  Let us know if you decide you want to see a pulmonologist.  I strongly recommend you proceed with sleep study as recommended for your fatigue. You should also talk to your primary care provider about your night time urination.

## 2018-10-11 NOTE — Telephone Encounter (Signed)
Patient's wife returned your call

## 2018-10-11 NOTE — Telephone Encounter (Signed)
   Please call pt. I discussed his Plavix with Dr. Angelena Form. Per Dr. Angelena Form, "I think it would be ok to stop Plavix and use ASA 81 mg with his Xarelto." Thanks. Dayna Dunn PA-C

## 2018-10-11 NOTE — Telephone Encounter (Signed)
Sent to precert [birth year].  0/9/31 PA submitted to [facility] via web portal for sleep study. Pending Auth # [id number].

## 2018-10-11 NOTE — Telephone Encounter (Signed)
Pt returned my call and he has been made aware it is ok to d/c Plavix and start Aspirin 81 mg daily along with his Xarelto.

## 2018-10-11 NOTE — Telephone Encounter (Signed)
Call placed to pt re: message below.  Left a message for pt to call back.  

## 2018-10-11 NOTE — Telephone Encounter (Signed)
-----   Message from Jeanann Lewandowsky, [place] sent at 09/21/2018 11:47 AM EDT ----- Regarding: sleep study Pt needs a sleep study.  thanks

## 2018-10-11 NOTE — Telephone Encounter (Signed)
Staff message sent to [facility] denied in lab sleep study. Can order HST if ok with provider. No PA is required.

## 2018-10-13 ENCOUNTER — Other Ambulatory Visit: Payer: Self-pay | Admitting: Cardiology

## 2018-10-16 ENCOUNTER — Telehealth: Payer: Self-pay | Admitting: *Deleted

## 2018-10-16 NOTE — Telephone Encounter (Signed)
Patient/Wife  is aware and agreeable to Home Sleep Study through [facility]. Patient/wifes scheduled for 12/22/18 at 12 pm to pick up home sleep kit and meet with Respiratory therapist at [facility]. Patient/wife is aware that if this appointment date and time does not work for them they should contact [facility] directly at [phone]. Patient/wife is aware that a sleep packet will be sent from [facility] in week. Patient/wife is agreeable to treatment and thankful for call.

## 2018-10-16 NOTE — Addendum Note (Signed)
Addended by: Freada Bergeron on: 10/16/2018 02:13 PM   Modules accepted: Orders

## 2018-10-16 NOTE — Telephone Encounter (Signed)
-----   Message from Lauralee Evener, [place] sent at 10/11/2018  1:43 PM EDT ----- Regarding: RE: sleep study [employer] denied in lab sleep study. Ok to order HST does not need a PA. ----- Message ----- From: Jeanann Lewandowsky, RMA Sent: 09/21/2018  11:47 AM EDT To: Freada Bergeron, CMA, Cv Div Sleep Studies Subject: sleep study                                    Pt needs a sleep study.  thanks

## 2018-10-28 ENCOUNTER — Other Ambulatory Visit: Payer: Self-pay | Admitting: Internal Medicine

## 2018-11-08 ENCOUNTER — Ambulatory Visit: Payer: 59 | Admitting: Internal Medicine

## 2018-11-25 ENCOUNTER — Other Ambulatory Visit: Payer: Self-pay | Admitting: Internal Medicine

## 2018-12-08 ENCOUNTER — Other Ambulatory Visit: Payer: Self-pay | Admitting: Cardiology

## 2018-12-13 ENCOUNTER — Other Ambulatory Visit (INDEPENDENT_AMBULATORY_CARE_PROVIDER_SITE_OTHER): Payer: 59

## 2018-12-13 ENCOUNTER — Encounter: Payer: Self-pay | Admitting: Internal Medicine

## 2018-12-13 ENCOUNTER — Other Ambulatory Visit: Payer: Self-pay

## 2018-12-13 ENCOUNTER — Ambulatory Visit (INDEPENDENT_AMBULATORY_CARE_PROVIDER_SITE_OTHER): Payer: 59 | Admitting: Internal Medicine

## 2018-12-13 VITALS — BP 146/78 | HR 62 | Temp 98.7°F | Ht 65.0 in | Wt 245.0 lb

## 2018-12-13 DIAGNOSIS — I2510 Atherosclerotic heart disease of native coronary artery without angina pectoris: Secondary | ICD-10-CM | POA: Diagnosis not present

## 2018-12-13 DIAGNOSIS — I10 Essential (primary) hypertension: Secondary | ICD-10-CM

## 2018-12-13 DIAGNOSIS — F17200 Nicotine dependence, unspecified, uncomplicated: Secondary | ICD-10-CM

## 2018-12-13 DIAGNOSIS — R202 Paresthesia of skin: Secondary | ICD-10-CM

## 2018-12-13 DIAGNOSIS — Z23 Encounter for immunization: Secondary | ICD-10-CM | POA: Diagnosis not present

## 2018-12-13 DIAGNOSIS — I25110 Atherosclerotic heart disease of native coronary artery with unstable angina pectoris: Secondary | ICD-10-CM

## 2018-12-13 DIAGNOSIS — R0609 Other forms of dyspnea: Secondary | ICD-10-CM

## 2018-12-13 DIAGNOSIS — R0600 Dyspnea, unspecified: Secondary | ICD-10-CM

## 2018-12-13 LAB — BASIC METABOLIC PANEL
BUN: 10 mg/dL (ref 6–23)
CO2: 30 mEq/L (ref 19–32)
Calcium: 10.2 mg/dL (ref 8.4–10.5)
Chloride: 96 mEq/L (ref 96–112)
Creatinine, Ser: 0.98 mg/dL (ref 0.40–1.50)
GFR: 77.4 mL/min (ref >60.00)
Glucose, Bld: 231 mg/dL — ABNORMAL HIGH (ref 70–99)
Potassium: 4.6 mEq/L (ref 3.5–5.1)
Sodium: 134 mEq/L — ABNORMAL LOW (ref 135–145)

## 2018-12-13 LAB — VITAMIN D 25 HYDROXY (VIT D DEFICIENCY, FRACTURES): VITD: 26.1 ng/mL — ABNORMAL LOW (ref 30.00–100.00)

## 2018-12-13 LAB — TSH: TSH: 0.66 u[IU]/mL (ref 0.35–4.50)

## 2018-12-13 LAB — VITAMIN B12: Vitamin B-12: 269 pg/mL (ref 211–911)

## 2018-12-13 LAB — HEMOGLOBIN A1C: Hgb A1c MFr Bld: 9.3 % — ABNORMAL HIGH (ref 4.6–6.5)

## 2018-12-13 NOTE — Assessment & Plan Note (Signed)
   Mid LAD lesion is 20% stenosed.  Mid RCA lesion is 40% stenosed.  Previously placed Prox Cx stent (unknown type) is widely patent.  Previously placed Mid Cx stent (unknown type) is widely patent.   1. Mild non-obstructive disease in the LAD 2. Mild to moderate stenosis in the mid RCA, unchanged from last cath 3. Patent stents mid Circumflex with no restenosis.  4. Normal filling pressures  Recommendations: No further ischemic testing at this time. CAD is stable. Filling pressures are normal. Dyspnea likely due to his obesity, deconditioning and ongoing tobacco abuse.

## 2018-12-13 NOTE — Assessment & Plan Note (Signed)
Cath 7/20:  Mid LAD lesion is 20% stenosed.  Mid RCA lesion is 40% stenosed.  Previously placed Prox Cx stent (unknown type) is widely patent.  Previously placed Mid Cx stent (unknown type) is widely patent.   1. Mild non-obstructive disease in the LAD 2. Mild to moderate stenosis in the mid RCA, unchanged from last cath 3. Patent stents mid Circumflex with no restenosis.  4. Normal filling pressures  Recommendations: No further ischemic testing at this time. CAD is stable. Filling pressures are normal. Dyspnea likely due to his obesity, deconditioning and ongoing tobacco abuse.   Cont w/wt loss Smoke less

## 2018-12-13 NOTE — Assessment & Plan Note (Signed)
BP Readings from Last 3 Encounters:  12/13/18 (!) 146/78  10/11/18 136/60  09/28/18 116/83

## 2018-12-13 NOTE — Progress Notes (Signed)
Subjective:  Patient ID: Anthony Woods, male    DOB: [birth year]  Age: 63 y.o. MRN: [id number]  CC: No chief complaint on file.   HPI Anthony Woods presents for 3 mo f/u - CAD, HTN, DM C/o L arm numbness at times. S/p heart cath inn Jul 2020:   Mid LAD lesion is 20% stenosed.  Mid RCA lesion is 40% stenosed.  Previously placed Prox Cx stent (unknown type) is widely patent.  Previously placed Mid Cx stent (unknown type) is widely patent.   1. Mild non-obstructive disease in the LAD 2. Mild to moderate stenosis in the mid RCA, unchanged from last cath 3. Patent stents mid Circumflex with no restenosis.  4. Normal filling pressures  Recommendations: No further ischemic testing at this time. CAD is stable. Filling pressures are normal. Dyspnea likely due to his obesity, deconditioning and ongoing tobacco abuse.   Outpatient Medications Prior to Visit  Medication Sig Dispense Refill  . acetaminophen (TYLENOL) 500 MG tablet Take 1 tablet (500 mg total) by mouth every 6 (six) hours as needed. 30 tablet 0  . amLODipine (NORVASC) 5 MG tablet Take 1 tablet (5 mg total) by mouth daily. 30 tablet 3  . aspirin EC 81 MG tablet Take 1 tablet (81 mg total) by mouth daily. 90 tablet 3  . atenolol-chlorthalidone (TENORETIC) 100-25 MG tablet TAKE 1 TABLET BY MOUTH DAILY. 30 tablet 5  . clopidogrel (PLAVIX) 75 MG tablet TAKE 1 TABLET BY MOUTH DAILY WITH BREAKFAST. 30 tablet 11  . diphenhydrAMINE (BENADRYL) 25 mg capsule Take 25 mg by mouth as needed (bee stings).    . EPINEPHrine 0.3 mg/0.3 mL IJ SOAJ injection Inject 0.3 mLs (0.3 mg total) into the muscle as needed for anaphylaxis.    Marland Kitchen escitalopram (LEXAPRO) 10 MG tablet TAKE 1 TABLET BY MOUTH DAILY. 30 tablet 3  . insulin lispro protamine-lispro (HUMALOG 75/25 MIX) (75-25) 100 UNIT/ML SUSP injection Inject 60 Units into the skin daily.    . Insulin Pen Needle (ULTICARE MICRO PEN NEEDLES) 32G X 4 MM MISC use with insulin pen to inject insulin  2x daily. 100 each PRN  . isosorbide mononitrate (IMDUR) 30 MG 24 hr tablet TAKE 1 TABLET BY MOUTH DAILY. 30 tablet 0  . levothyroxine (SYNTHROID, LEVOTHROID) 175 MCG tablet TAKE 1 TABLET BY MOUTH DAILY. 30 tablet 11  . linagliptin (TRADJENTA) 5 MG TABS tablet Take 1 tablet (5 mg total) by mouth daily. 90 tablet 3  . losartan (COZAAR) 100 MG tablet TAKE 1 TABLET BY MOUTH DAILY. 30 tablet 11  . metFORMIN (GLUCOPHAGE) 1000 MG tablet Take 1 tablet (1,000 mg total) by mouth 2 (two) times daily with a meal. 180 tablet 3  . nitroGLYCERIN (NITROSTAT) 0.4 MG SL tablet Place 1 tablet (0.4 mg total) under the tongue every 5 (five) minutes as needed for chest pain. 30 tablet 0  . OZEMPIC, 0.25 OR 0.5 MG/DOSE, 2 MG/1.5ML SOPN Inject 0.5 mg into the skin every Monday.   6  . potassium chloride (K-DUR) 10 MEQ tablet TAKE 1 TABLET BY MOUTH 2 TIMES DAILY. 60 tablet 11  . potassium chloride SA (K-DUR) 20 MEQ tablet Take 1 tablet (20 mEq total) by mouth 2 (two) times daily. 60 tablet 11  . predniSONE (DELTASONE) 10 MG tablet Take 10 mg by mouth daily as needed (if stung by bee).    . rosuvastatin (CRESTOR) 20 MG tablet TAKE 1 TABLET BY MOUTH AT BEDTIME. 90 tablet 3  . XARELTO 20  MG TABS tablet TAKE 1 TABLET BY MOUTH DAILY. 30 tablet 11   Facility-Administered Medications Prior to Visit  Medication Dose Route Frequency Provider Last Rate Last Dose  . sodium chloride flush (NS) 0.9 % injection 3 mL  3 mL Intravenous Q12H Dunn, Dayna N, PA-C        ROS: Review of Systems  Constitutional: Negative for appetite change, fatigue and unexpected weight change.  HENT: Negative for congestion, nosebleeds, sneezing, sore throat and trouble swallowing.   Eyes: Negative for itching and visual disturbance.  Respiratory: Negative for cough.   Cardiovascular: Negative for chest pain, palpitations and leg swelling.  Gastrointestinal: Negative for abdominal distention, blood in stool, diarrhea and nausea.  Genitourinary:  Negative for frequency and hematuria.  Musculoskeletal: Positive for neck stiffness. Negative for back pain, gait problem, joint swelling and neck pain.  Skin: Negative for rash.  Neurological: Negative for dizziness, tremors, speech difficulty and weakness.  Psychiatric/Behavioral: Negative for agitation, dysphoric mood, sleep disturbance and suicidal ideas. The patient is not nervous/anxious.     Objective:  BP (!) 146/78 (BP Location: Left Arm, Patient Position: Sitting, Cuff Size: Large)   Pulse 62   Temp 98.7 F (37.1 C) (Oral)   Ht 5\' 5"  (1.651 m)   Wt 245 lb (111.1 kg)   SpO2 93%   BMI 40.77 kg/m   BP Readings from Last 3 Encounters:  12/13/18 (!) 146/78  10/11/18 136/60  09/28/18 116/83    Wt Readings from Last 3 Encounters:  12/13/18 245 lb (111.1 kg)  10/11/18 247 lb (112 kg)  09/28/18 240 lb (108.9 kg)    Physical Exam Constitutional:      General: He is not in acute distress.    Appearance: He is well-developed.     Comments: NAD  Eyes:     Conjunctiva/sclera: Conjunctivae normal.     Pupils: Pupils are equal, round, and reactive to light.  Neck:     Musculoskeletal: Normal range of motion.     Thyroid: No thyromegaly.     Vascular: No JVD.  Cardiovascular:     Rate and Rhythm: Normal rate and regular rhythm.     Heart sounds: Normal heart sounds. No murmur. No friction rub. No gallop.   Pulmonary:     Effort: Pulmonary effort is normal. No respiratory distress.     Breath sounds: Normal breath sounds. No wheezing or rales.  Chest:     Chest wall: No tenderness.  Abdominal:     General: Bowel sounds are normal. There is no distension.     Palpations: Abdomen is soft. There is no mass.     Tenderness: There is no abdominal tenderness. There is no guarding or rebound.  Musculoskeletal: Normal range of motion.        General: No tenderness.  Lymphadenopathy:     Cervical: No cervical adenopathy.  Skin:    General: Skin is warm and dry.      Findings: No rash.  Neurological:     Mental Status: He is alert and oriented to person, place, and time.     Cranial Nerves: No cranial nerve deficit.     Motor: No abnormal muscle tone.     Coordination: Coordination normal.     Gait: Gait normal.     Deep Tendon Reflexes: Reflexes are normal and symmetric.  Psychiatric:        Behavior: Behavior normal.        Thought Content: Thought content normal.  Judgment: Judgment normal.   obese  Lab Results  Component Value Date   WBC 10.2 09/25/2018   HGB 15.0 09/28/2018   HGB 15.0 09/28/2018   HCT 44.0 09/28/2018   HCT 44.0 09/28/2018   PLT 202 09/25/2018   GLUCOSE 224 (H) 09/25/2018   CHOL 135 09/25/2018   TRIG 255 (H) 09/25/2018   HDL 39 (L) 09/25/2018   LDLDIRECT 100.0 12/09/2014   LDLCALC 45 09/25/2018   ALT 21 09/14/2017   AST 19 09/14/2017   NA 139 09/28/2018   NA 140 09/28/2018   K 3.6 09/28/2018   K 3.4 (L) 09/28/2018   CL 94 (L) 09/25/2018   CREATININE 0.97 09/25/2018   BUN 9 09/25/2018   CO2 26 09/25/2018   TSH 1.100 09/25/2018   PSA 0.41 09/14/2017   INR 1.12 05/12/2016   HGBA1C 9.3 (H) 09/08/2018   MICROALBUR 4.7 (H) 12/06/2013    No results found.  Assessment & Plan:   There are no diagnoses linked to this encounter.   No orders of the defined types were placed in this encounter.    Follow-up: No follow-ups on file.  Walker Kehr, MD

## 2018-12-13 NOTE — Assessment & Plan Note (Signed)
Discussed.

## 2018-12-13 NOTE — Assessment & Plan Note (Signed)
Cath 7/20:  Mid LAD lesion is 20% stenosed.  Mid RCA lesion is 40% stenosed.  Previously placed Prox Cx stent (unknown type) is widely patent.  Previously placed Mid Cx stent (unknown type) is widely patent.   1. Mild non-obstructive disease in the LAD 2. Mild to moderate stenosis in the mid RCA, unchanged from last cath 3. Patent stents mid Circumflex with no restenosis.  4. Normal filling pressures  Recommendations: No further ischemic testing at this time. CAD is stable. Filling pressures are normal. Dyspnea likely due to his obesity, deconditioning and ongoing tobacco abuse.

## 2018-12-13 NOTE — Addendum Note (Signed)
Addended by: Karren Cobble on: 12/13/2018 08:38 AM   Modules accepted: Orders

## 2018-12-14 ENCOUNTER — Other Ambulatory Visit: Payer: Self-pay | Admitting: Internal Medicine

## 2018-12-14 MED ORDER — CYANOCOBALAMIN 500 MCG PO TABS: 500.0000 ug | ORAL_TABLET | Freq: Every day | ORAL | 3 refills | Status: DC

## 2018-12-14 MED ORDER — VITAMIN D3 50 MCG (2000 UT) PO CAPS: 2000.0000 [IU] | ORAL_CAPSULE | Freq: Every day | ORAL | 3 refills | Status: DC

## 2018-12-14 MED ORDER — VITAMIN D3 1.25 MG (50000 UT) PO CAPS: 1.0000 | ORAL_CAPSULE | ORAL | 0 refills | Status: DC

## 2018-12-15 ENCOUNTER — Other Ambulatory Visit: Payer: Self-pay | Admitting: Internal Medicine

## 2018-12-22 ENCOUNTER — Ambulatory Visit (HOSPITAL_BASED_OUTPATIENT_CLINIC_OR_DEPARTMENT_OTHER): Payer: 59 | Attending: Physician Assistant | Admitting: Cardiology

## 2019-01-05 ENCOUNTER — Other Ambulatory Visit: Payer: Self-pay | Admitting: Cardiology

## 2019-01-12 ENCOUNTER — Other Ambulatory Visit: Payer: Self-pay | Admitting: Physician Assistant

## 2019-03-02 ENCOUNTER — Other Ambulatory Visit: Payer: Self-pay | Admitting: Cardiology

## 2019-03-14 ENCOUNTER — Ambulatory Visit: Payer: 59 | Admitting: Internal Medicine

## 2019-03-19 ENCOUNTER — Ambulatory Visit: Payer: 59 | Admitting: Internal Medicine

## 2019-03-20 ENCOUNTER — Ambulatory Visit: Payer: 59 | Admitting: Internal Medicine

## 2019-03-21 ENCOUNTER — Other Ambulatory Visit: Payer: Self-pay | Admitting: Internal Medicine

## 2019-03-26 ENCOUNTER — Ambulatory Visit: Payer: 59 | Admitting: Internal Medicine

## 2019-04-29 NOTE — Progress Notes (Signed)
Chief Complaint  Patient presents with  . Follow-up    CAD   History of Present Illness: 64 yo male with history of paroxysmal atrial fibrillation, CAD, HTN, DM, obesity and tobacco abuse who is here today for cardiac follow up. He was admitted to [facility] February 2018 with chest pain and was found to be in atrial fibrillation. 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 He was discharged on Plavix and Xarelto. Echo February 2018 with LVEF=50-55%. He was seen in the ED 08/18/17 with chest pain. He was seen by cardiology and discharged home. Cardiac cath 08/31/17 with severe stenosis in the proximal Circumflex treated with a drug eluting stents. The other stent in the Circumflex was patent.Thre was mild disease in the LAD and RCA.  LVEF was normal by LV gram. Normal ABI July 2019. He was admitted to [facility] June 2020 with chest pain and ruled out for MI. Echo June 2020 with LVEF=60-65%, no significant valve disease. Cardiac cath July 2020 with mild LAD and RCA disease with patent Circumflex stents.   He is here today for follow up. The patient denies any chest pain, dyspnea, palpitations, lower extremity edema, orthopnea, PND, dizziness, near syncope or syncope. He snores at night. He has daytime fatigue and somnolence. He has refused a sleep study in the past. Still smoking. Not exercising.   Primary Care Physician: Cassandria Anger, MD  Past Medical History:  Diagnosis Date  . Allergy to bee sting   . Anxiety   . Arthritis    "right knee" (05/12/2016)  . CAD (coronary artery disease)    a. DES to LCx 04/2016. b. DES to prox Cx 08/2017. c. Stable cath 09/2018 with mild nonobstructive residual disease, normal EF.  Marland Kitchen Headache    "when his sugar goes too high or too low" (05/12/2016)  . HTN (hypertension)   . Hyperlipidemia   . Hypothyroidism   . Macular degeneration   . Obesity   . PAF (paroxysmal atrial fibrillation) ([place])   . Type  II diabetes mellitus ([place])     Past Surgical History:  Procedure Laterality Date  . CARDIAC CATHETERIZATION    . COLONOSCOPY    . CORONARY ANGIOPLASTY WITH STENT PLACEMENT  05/12/2016  . CORONARY STENT INTERVENTION N/A 05/12/2016   Procedure: Coronary Stent Intervention;  Surgeon: Jettie Booze, MD;  Location: [place] [facility];  Service: Cardiovascular;  Laterality: N/A;  . CORONARY STENT INTERVENTION N/A 08/31/2017   Procedure: CORONARY STENT INTERVENTION;  Surgeon: Jettie Booze, MD;  Location: [place] [facility];  Service: Cardiovascular;  Laterality: N/A;  . GANGLION CYST EXCISION Left 1980s  . KNEE ARTHROSCOPY Right    Meniscus removal  . LEFT HEART CATH AND CORONARY ANGIOGRAPHY N/A 05/12/2016   Procedure: Left Heart Cath and Coronary Angiography;  Surgeon: Jettie Booze, MD;  Location: [place] [facility];  Service: Cardiovascular;  Laterality: N/A;  . LEFT HEART CATH AND CORONARY ANGIOGRAPHY N/A 08/31/2017   Procedure: LEFT HEART CATH AND CORONARY ANGIOGRAPHY;  Surgeon: Jettie Booze, MD;  Location: [place] [facility];  Service: Cardiovascular;  Laterality: N/A;  . LEFT HEART CATHETERIZATION WITH CORONARY ANGIOGRAM N/A 02/04/2012   Procedure: LEFT HEART CATHETERIZATION WITH CORONARY ANGIOGRAM;  Surgeon: Burnell Blanks, MD;  Location: [facility]ATH [facility];  Service: Cardiovascular;  Laterality: N/A;  . POLYPECTOMY    . RIGHT/LEFT HEART CATH AND CORONARY ANGIOGRAPHY N/A 09/28/2018   Procedure: RIGHT/LEFT  HEART CATH AND CORONARY ANGIOGRAPHY;  Surgeon: Burnell Blanks, MD;  Location: [place] [facility];  Service: Cardiovascular;  Laterality: N/A;    Current Outpatient Medications  Medication Sig Dispense Refill  . acetaminophen (TYLENOL) 500 MG tablet Take 1 tablet (500 mg total) by mouth every 6 (six) hours as needed. 30 tablet 0  . amLODipine (NORVASC) 5 MG tablet Take 1 tablet (5 mg total) by mouth daily. Please contact the office and make an appointment  for further refills. [phone]. 30 tablet 3  . aspirin EC 81 MG tablet Take 1 tablet (81 mg total) by mouth daily. 90 tablet 3  . atenolol-chlorthalidone (TENORETIC) 100-25 MG tablet TAKE 1 TABLET BY MOUTH DAILY. 30 tablet 5  . diphenhydrAMINE (BENADRYL) 25 mg capsule Take 25 mg by mouth as needed (bee stings).    . EPINEPHrine 0.3 mg/0.3 mL IJ SOAJ injection Inject 0.3 mLs (0.3 mg total) into the muscle as needed for anaphylaxis.    Marland Kitchen escitalopram (LEXAPRO) 10 MG tablet TAKE 1 TABLET BY MOUTH DAILY. 30 tablet 3  . insulin lispro protamine-lispro (HUMALOG 75/25 MIX) (75-25) 100 UNIT/ML SUSP injection Inject 60 Units into the skin daily.    . Insulin Pen Needle (ULTICARE MICRO PEN NEEDLES) 32G X 4 MM MISC use with insulin pen to inject insulin 2x daily. 100 each PRN  . isosorbide mononitrate (IMDUR) 30 MG 24 hr tablet Take 1 tablet (30 mg total) by mouth daily. ** DO NOT CRUSH ** 90 tablet 2  . JARDIANCE 25 MG TABS tablet Take 25 mg by mouth daily.    Marland Kitchen levothyroxine (SYNTHROID) 175 MCG tablet TAKE 1 TABLET BY MOUTH DAILY. 30 tablet 11  . linagliptin (TRADJENTA) 5 MG TABS tablet Take 1 tablet (5 mg total) by mouth daily. 90 tablet 3  . losartan (COZAAR) 100 MG tablet TAKE 1 TABLET BY MOUTH DAILY. 30 tablet 11  . metFORMIN (GLUCOPHAGE) 1000 MG tablet Take 1 tablet (1,000 mg total) by mouth 2 (two) times daily with a meal. 180 tablet 3  . nitroGLYCERIN (NITROSTAT) 0.4 MG SL tablet Place 1 tablet (0.4 mg total) under the tongue every 5 (five) minutes as needed for chest pain. 30 tablet 0  . OZEMPIC, 0.25 OR 0.5 MG/DOSE, 2 MG/1.5ML SOPN Inject 0.5 mg into the skin every Monday.   6  . potassium chloride SA (K-DUR) 20 MEQ tablet Take 1 tablet (20 mEq total) by mouth 2 (two) times daily. 60 tablet 11  . predniSONE (DELTASONE) 10 MG tablet Take 10 mg by mouth daily as needed (if stung by bee).    . rosuvastatin (CRESTOR) 20 MG tablet TAKE 1 TABLET BY MOUTH AT BEDTIME. 90 tablet 3  . vitamin B-12 (V-R  VITAMIN B-12) 500 MCG tablet Take 1 tablet (500 mcg total) by mouth daily. 100 tablet 3  . XARELTO 20 MG TABS tablet TAKE 1 TABLET BY MOUTH DAILY. 30 tablet 11   Current Facility-Administered Medications  Medication Dose Route Frequency Provider Last Rate Last Admin  . sodium chloride flush (NS) 0.9 % injection 3 mL  3 mL Intravenous Q12H Dunn, Dayna N, PA-C        Allergies  Allergen Reactions  . Bee Venom Anaphylaxis  . Actos [Pioglitazone Hydrochloride] Swelling  . Lipitor [Atorvastatin] Other (See Comments)    Patient cannot recall reaction  . Lisinopril Other (See Comments)    cough    Social History   Socioeconomic History  . Marital status: Married    Spouse  name: Not on file  . Number of children: 1  . Years of education: Not on file  . Highest education level: Not on file  Occupational History  . Occupation: [employer]  . Occupation: GRAVE DIGGER    Employer: [employer]  Tobacco Use  . Smoking status: Current Every Day Smoker    Packs/day: 1.50    Years: 42.00    Pack years: 63.00    Types: Cigarettes  . Smokeless tobacco: Never Used  Substance and Sexual Activity  . Alcohol use: No  . Drug use: No  . Sexual activity: Not Currently  Other Topics Concern  . Not on file  Social History Narrative  . Not on file   Social Determinants of Health   Financial Resource Strain:   . Difficulty of Paying Living Expenses: Not on file  Food Insecurity:   . Worried About Charity fundraiser in the Last Year: Not on file  . Ran Out of Food in the Last Year: Not on file  Transportation Needs:   . Lack of Transportation (Medical): Not on file  . Lack of Transportation (Non-Medical): Not on file  Physical Activity:   . Days of Exercise per Week: Not on file  . Minutes of Exercise per Session: Not on file  Stress:   . Feeling of Stress : Not on file  Social Connections:   . Frequency of Communication with Friends and Family: Not on file  . Frequency of  Social Gatherings with Friends and Family: Not on file  . Attends Religious Services: Not on file  . Active Member of Clubs or Organizations: Not on file  . Attends Archivist Meetings: Not on file  . Marital Status: Not on file  Intimate Partner Violence:   . Fear of Current or Ex-Partner: Not on file  . Emotionally Abused: Not on file  . Physically Abused: Not on file  . Sexually Abused: Not on file    Family History  Problem Relation Age of Onset  . Diabetes Father   . Cancer Father        stomach  . CVA Father   . Stomach cancer Father   . Diabetes Mother   . Cancer Mother        male ca  . Colon cancer Mother 62  . CAD Maternal Grandmother   . Breast cancer Sister   . Diabetes Sister   . Esophageal cancer Neg Hx   . Rectal cancer Neg Hx     Review of Systems:  As stated in the HPI and otherwise negative.   BP (!) 144/82   Pulse (!) 55   Ht 5\' 5"  (1.651 m)   Wt 244 lb 12.8 oz (111 kg)   SpO2 97%   BMI 40.74 kg/m   Physical Examination:  General: Well developed, well nourished, NAD  HEENT: OP clear, mucus membranes moist  SKIN: warm, dry. No rashes. Neuro: No focal deficits  Musculoskeletal: Muscle strength 5/5 all ext  Psychiatric: Mood and affect normal  Neck: No JVD, no carotid bruits, no thyromegaly, no lymphadenopathy.  Lungs:Clear bilaterally, no wheezes, rhonci, crackles Cardiovascular: Regular rate and rhythm. No murmurs, gallops or rubs. Abdomen:Soft. Bowel sounds present. Non-tender.  Extremities: No lower extremity edema. Pulses are 2 + in the bilateral DP/PT.  EKG:  EKG is not ordered today. The ekg ordered today demonstrates   Recent Labs: 09/08/2018: B Natriuretic Peptide 141.1 09/25/2018: Platelets 202 09/28/2018: Hemoglobin 15.0; Hemoglobin 15.0  12/13/2018: BUN 10; Creatinine, Ser 0.98; Potassium 4.6; Sodium 134; TSH 0.66   Lipid Panel    Component Value Date/Time   CHOL 135 09/25/2018 0801   TRIG 255 (H) 09/25/2018 0801    TRIG 219 (HH) 02/08/2006 0757   HDL 39 (L) 09/25/2018 0801   CHOLHDL 3.5 09/25/2018 0801   CHOLHDL 4 09/14/2017 0850   VLDL 37.4 09/14/2017 0850   LDLCALC 45 09/25/2018 0801   LDLDIRECT 100.0 12/09/2014 0827     Wt Readings from Last 3 Encounters:  04/30/19 244 lb 12.8 oz (111 kg)  12/13/18 245 lb (111.1 kg)  10/11/18 247 lb (112 kg)     Other studies Reviewed: Additional studies/ records that were reviewed today include: . Review of the above records demonstrates:   Assessment and Plan:   1. CAD with angina: No recent changes in his angina. Will continue ASA,  beta blocker and statin.  2. Atrial fibrillation, paroxysmal: He appears to be in sinus today. Will continue Xarelto and the beta blocker.       3. HTN: BP is well controlled at home. NO changes today  4. Tobacco abuse: I have asked him to stop smoking  5. Morbid obesity: Weight loss is encouraged  6. Daytime fatigue: Will arrange sleep study.  Current medicines are reviewed at length with the patient today.  The patient does not have concerns regarding medicines.  The following changes have been made:  no change  Labs/ tests ordered today include:   Orders Placed This Encounter  Procedures  . Split night study   Disposition:   FU with office APP on my care team in 6 months.    Signed, Lauree Chandler, MD 04/30/2019 9:28 AM    [facility] [place], [place], [place]  [address] Phone: [phone]; Fax: [phone]

## 2019-04-30 ENCOUNTER — Encounter: Payer: Self-pay | Admitting: Cardiovascular Disease

## 2019-04-30 ENCOUNTER — Ambulatory Visit: Payer: 59 | Admitting: Cardiovascular Disease

## 2019-04-30 ENCOUNTER — Other Ambulatory Visit: Payer: Self-pay

## 2019-04-30 VITALS — BP 144/82 | HR 55 | Ht 65.0 in | Wt 244.8 lb

## 2019-04-30 DIAGNOSIS — I10 Essential (primary) hypertension: Secondary | ICD-10-CM | POA: Diagnosis not present

## 2019-04-30 DIAGNOSIS — I480 Paroxysmal atrial fibrillation: Secondary | ICD-10-CM

## 2019-04-30 DIAGNOSIS — Z720 Tobacco use: Secondary | ICD-10-CM

## 2019-04-30 DIAGNOSIS — R5383 Other fatigue: Secondary | ICD-10-CM

## 2019-04-30 DIAGNOSIS — I25118 Atherosclerotic heart disease of native coronary artery with other forms of angina pectoris: Secondary | ICD-10-CM | POA: Diagnosis not present

## 2019-04-30 NOTE — Patient Instructions (Addendum)
Medication Instructions:  No Changes *If you need a refill on your cardiac medications before your next appointment, please call your pharmacy*  Lab Work: none If you have labs (blood work) drawn today and your tests are completely normal, you will receive your results only by: Marland Kitchen MyChart Message (if you have MyChart) OR . A paper copy in the mail If you have any lab test that is abnormal or we need to change your treatment, we will call you to review the results.  Testing/Procedures: Your physician has recommended that you have a sleep study. This test records several body functions during sleep, including: brain activity, eye movement, oxygen and carbon dioxide blood levels, heart rate and rhythm, breathing rate and rhythm, the flow of air through your mouth and nose, snoring, body muscle movements, and chest and belly movement.   Follow-Up: At [facility], you and your health needs are our priority.  As part of our continuing mission to provide you with exceptional heart care, we have created designated Provider Care Teams.  These Care Teams include your primary Cardiologist (physician) and Advanced Practice Providers (APPs -  Physician Assistants and Nurse Practitioners) who all work together to provide you with the care you need, when you need it.  Your next appointment:   12 month(s)  The format for your next appointment:   Either In Person or Virtual  Provider:   You may see Lauree Chandler, MD or one of the following Advanced Practice Providers on your designated Care Team:    Melina Copa, PA-C  Ermalinda Barrios, PA-C   Other Instructions  Addendum: 04/30/19 9:22 am: contacted [facility] [phone].  Confirmed patient taking amlodipine 5 mg daily and potassium chloride 20 meq twice daily.  Medication list up to date at this time.

## 2019-05-01 ENCOUNTER — Ambulatory Visit: Payer: 59 | Admitting: Internal Medicine

## 2019-05-01 ENCOUNTER — Encounter: Payer: Self-pay | Admitting: Internal Medicine

## 2019-05-01 DIAGNOSIS — I4891 Unspecified atrial fibrillation: Secondary | ICD-10-CM | POA: Diagnosis not present

## 2019-05-01 DIAGNOSIS — E11649 Type 2 diabetes mellitus with hypoglycemia without coma: Secondary | ICD-10-CM

## 2019-05-01 DIAGNOSIS — M25561 Pain in right knee: Secondary | ICD-10-CM | POA: Diagnosis not present

## 2019-05-01 DIAGNOSIS — I2510 Atherosclerotic heart disease of native coronary artery without angina pectoris: Secondary | ICD-10-CM | POA: Diagnosis not present

## 2019-05-01 DIAGNOSIS — E119 Type 2 diabetes mellitus without complications: Secondary | ICD-10-CM | POA: Diagnosis not present

## 2019-05-01 LAB — BASIC METABOLIC PANEL
BUN: 17 mg/dL (ref 6–23)
CO2: 33 mEq/L — ABNORMAL HIGH (ref 19–32)
Calcium: 10.4 mg/dL (ref 8.4–10.5)
Chloride: 98 mEq/L (ref 96–112)
Creatinine, Ser: 1.1 mg/dL (ref 0.40–1.50)
GFR: 67.65 mL/min (ref >60.00)
Glucose, Bld: 295 mg/dL — ABNORMAL HIGH (ref 70–99)
Potassium: 4 mEq/L (ref 3.5–5.1)
Sodium: 137 mEq/L (ref 135–145)

## 2019-05-01 LAB — HEMOGLOBIN A1C: Hgb A1c MFr Bld: 9.8 % — ABNORMAL HIGH (ref 4.6–6.5)

## 2019-05-01 MED ORDER — CYANOCOBALAMIN 500 MCG PO TABS: 500.0000 ug | ORAL_TABLET | Freq: Every day | ORAL | 3 refills | Status: DC

## 2019-05-01 MED ORDER — VITAMIN D3 50 MCG (2000 UT) PO CAPS: 2000.0000 [IU] | ORAL_CAPSULE | Freq: Every day | ORAL | 3 refills | Status: DC

## 2019-05-01 NOTE — Patient Instructions (Signed)
Postprocedure instructions :    A Band-Aid should be left on for 12 hours. Injection therapy is not a cure itself. It is used in conjunction with other modalities. You can use nonsteroidal anti-inflammatories like ibuprofen , hot and cold compresses. Rest is recommended in the next 24 hours. You need to report immediately  if fever, chills or any signs of infection develop. 

## 2019-05-01 NOTE — Assessment & Plan Note (Signed)
Continue with Xarelto 

## 2019-05-01 NOTE — Assessment & Plan Note (Signed)
Worse.  Continue to lose weight and improve diet; continue current therapy, obtain lab work

## 2019-05-01 NOTE — Assessment & Plan Note (Signed)
Worsening pain.  Options discussed.  We will inject with cortisone.  Increased risk of bleeding on Xarelto discussed.  See procedure

## 2019-05-01 NOTE — Assessment & Plan Note (Signed)
No angina.  Continue current therapy.  He needs to stop smoking

## 2019-05-01 NOTE — Progress Notes (Signed)
Subjective:  Patient ID: [facility], male    DOB: 1955/09/26  Age: 64 y.o. MRN: [id number]  CC: No chief complaint on file.   HPI Omran A Luthi presents for hypertension, diabetes, and a vitamin B12 deficiency follow-up.  He has been complaining of severe pain in the right knee, worse with walking and standing.  It is been hard for him to climb steps or go down the steps.  Outpatient Medications Prior to Visit  Medication Sig Dispense Refill  . acetaminophen (TYLENOL) 500 MG tablet Take 1 tablet (500 mg total) by mouth every 6 (six) hours as needed. 30 tablet 0  . amLODipine (NORVASC) 5 MG tablet Take 1 tablet (5 mg total) by mouth daily. Please contact the office and make an appointment for further refills. [phone]. 30 tablet 3  . aspirin EC 81 MG tablet Take 1 tablet (81 mg total) by mouth daily. 90 tablet 3  . atenolol-chlorthalidone (TENORETIC) 100-25 MG tablet TAKE 1 TABLET BY MOUTH DAILY. 30 tablet 5  . diphenhydrAMINE (BENADRYL) 25 mg capsule Take 25 mg by mouth as needed (bee stings).    . EPINEPHrine 0.3 mg/0.3 mL IJ SOAJ injection Inject 0.3 mLs (0.3 mg total) into the muscle as needed for anaphylaxis.    Marland Kitchen escitalopram (LEXAPRO) 10 MG tablet TAKE 1 TABLET BY MOUTH DAILY. 30 tablet 3  . insulin lispro protamine-lispro (HUMALOG 75/25 MIX) (75-25) 100 UNIT/ML SUSP injection Inject 60 Units into the skin daily.    . Insulin Pen Needle (ULTICARE MICRO PEN NEEDLES) 32G X 4 MM MISC use with insulin pen to inject insulin 2x daily. 100 each PRN  . isosorbide mononitrate (IMDUR) 30 MG 24 hr tablet Take 1 tablet (30 mg total) by mouth daily. ** DO NOT CRUSH ** 90 tablet 2  . JARDIANCE 25 MG TABS tablet Take 25 mg by mouth daily.    Marland Kitchen levothyroxine (SYNTHROID) 175 MCG tablet TAKE 1 TABLET BY MOUTH DAILY. 30 tablet 11  . linagliptin (TRADJENTA) 5 MG TABS tablet Take 1 tablet (5 mg total) by mouth daily. 90 tablet 3  . losartan (COZAAR) 100 MG tablet TAKE 1 TABLET BY MOUTH DAILY. 30  tablet 11  . metFORMIN (GLUCOPHAGE) 1000 MG tablet Take 1 tablet (1,000 mg total) by mouth 2 (two) times daily with a meal. 180 tablet 3  . nitroGLYCERIN (NITROSTAT) 0.4 MG SL tablet Place 1 tablet (0.4 mg total) under the tongue every 5 (five) minutes as needed for chest pain. 30 tablet 0  . OZEMPIC, 0.25 OR 0.5 MG/DOSE, 2 MG/1.5ML SOPN Inject 0.5 mg into the skin every Monday.   6  . potassium chloride SA (K-DUR) 20 MEQ tablet Take 1 tablet (20 mEq total) by mouth 2 (two) times daily. 60 tablet 11  . predniSONE (DELTASONE) 10 MG tablet Take 10 mg by mouth daily as needed (if stung by bee).    . rosuvastatin (CRESTOR) 20 MG tablet TAKE 1 TABLET BY MOUTH AT BEDTIME. 90 tablet 3  . vitamin B-12 (V-R VITAMIN B-12) 500 MCG tablet Take 1 tablet (500 mcg total) by mouth daily. 100 tablet 3  . XARELTO 20 MG TABS tablet TAKE 1 TABLET BY MOUTH DAILY. 30 tablet 11   Facility-Administered Medications Prior to Visit  Medication Dose Route Frequency Provider Last Rate Last Admin  . sodium chloride flush (NS) 0.9 % injection 3 mL  3 mL Intravenous Q12H Dunn, Dayna N, PA-C        ROS: Review of Systems  Constitutional: Positive for unexpected weight change. Negative for appetite change and fatigue.  HENT: Negative for congestion, nosebleeds, sneezing, sore throat and trouble swallowing.   Eyes: Negative for itching and visual disturbance.  Respiratory: Negative for cough.   Cardiovascular: Negative for chest pain, palpitations and leg swelling.  Gastrointestinal: Negative for abdominal distention, blood in stool, diarrhea and nausea.  Genitourinary: Negative for frequency and hematuria.  Musculoskeletal: Positive for arthralgias and gait problem. Negative for back pain, joint swelling and neck pain.  Skin: Negative for rash.  Neurological: Negative for dizziness, tremors, speech difficulty and weakness.  Psychiatric/Behavioral: Negative for agitation, dysphoric mood and sleep disturbance. The patient is  not nervous/anxious.   Limping He lost weight on diet  Objective:  BP (!) 150/74 (BP Location: Left Arm, Patient Position: Sitting, Cuff Size: Large)   Pulse 63   Temp 97.8 F (36.6 C) (Oral)   Ht 5\' 5"  (1.651 m)   Wt 236 lb 6 oz (107.2 kg)   SpO2 96%   BMI 39.33 kg/m   BP Readings from Last 3 Encounters:  05/01/19 (!) 150/74  04/30/19 (!) 144/82  12/13/18 (!) 146/78    Wt Readings from Last 3 Encounters:  05/01/19 236 lb 6 oz (107.2 kg)  04/30/19 244 lb 12.8 oz (111 kg)  12/13/18 245 lb (111.1 kg)    Physical Exam Constitutional:      General: He is not in acute distress.    Appearance: He is well-developed. He is obese.     Comments: NAD  Eyes:     Conjunctiva/sclera: Conjunctivae normal.     Pupils: Pupils are equal, round, and reactive to light.  Neck:     Thyroid: No thyromegaly.     Vascular: No JVD.  Cardiovascular:     Rate and Rhythm: Normal rate and regular rhythm.     Heart sounds: Normal heart sounds. No murmur. No friction rub. No gallop.   Pulmonary:     Effort: Pulmonary effort is normal. No respiratory distress.     Breath sounds: Normal breath sounds. No wheezing or rales.  Chest:     Chest wall: No tenderness.  Abdominal:     General: Bowel sounds are normal. There is no distension.     Palpations: Abdomen is soft. There is no mass.     Tenderness: There is no abdominal tenderness. There is no guarding or rebound.  Musculoskeletal:        General: Tenderness present. Normal range of motion.     Cervical back: Normal range of motion.  Lymphadenopathy:     Cervical: No cervical adenopathy.  Skin:    General: Skin is warm and dry.     Findings: No rash.  Neurological:     Mental Status: He is alert and oriented to person, place, and time.     Cranial Nerves: No cranial nerve deficit.     Motor: No abnormal muscle tone.     Coordination: Coordination normal.     Gait: Gait abnormal.     Deep Tendon Reflexes: Reflexes are normal and  symmetric.  Psychiatric:        Behavior: Behavior normal.        Thought Content: Thought content normal.        Judgment: Judgment normal.   Right knee painful lump with range of motion and palpation no swelling      Procedure Note :     Procedure : Joint Injection, R   knee   Indication:  Joint osteoarthritis with refractory  chronic pain.   Risks including unsuccessful procedure , bleeding, infection, bruising, skin atrophy, "steroid flare-up" and others were explained to the patient in detail as well as the benefits. Informed consent was obtained and signed.   Tthe patient was placed in a comfortable position. Lateral approach was used. Skin was prepped with Betadine and alcohol  and anesthetized a cooling spray. Then, a 5 cc syringe with a 1.5 inch long 25-gauge needle was used for a joint injection.. The needle was advanced  Into the knee joint cavity. I aspirated a small amount of intra-articular fluid to confirm correct placement of the needle and injected the joint with 5 mL of 2% lidocaine and 80 mg of Depo-Medrol .  Band-Aid was applied.   Tolerated well. Complications: None. Good pain relief following the procedure.    Lab Results  Component Value Date   WBC 10.2 09/25/2018   HGB 15.0 09/28/2018   HGB 15.0 09/28/2018   HCT 44.0 09/28/2018   HCT 44.0 09/28/2018   PLT 202 09/25/2018   GLUCOSE 231 (H) 12/13/2018   CHOL 135 09/25/2018   TRIG 255 (H) 09/25/2018   HDL 39 (L) 09/25/2018   LDLDIRECT 100.0 12/09/2014   LDLCALC 45 09/25/2018   ALT 21 09/14/2017   AST 19 09/14/2017   NA 134 (L) 12/13/2018   K 4.6 12/13/2018   CL 96 12/13/2018   CREATININE 0.98 12/13/2018   BUN 10 12/13/2018   CO2 30 12/13/2018   TSH 0.66 12/13/2018   PSA 0.41 09/14/2017   INR 1.12 05/12/2016   HGBA1C 9.3 (H) 12/13/2018   MICROALBUR 4.7 (H) 12/06/2013    No results found.  Assessment & Plan:    Walker Kehr, MD

## 2019-05-04 ENCOUNTER — Other Ambulatory Visit: Payer: Self-pay | Admitting: Cardiovascular Disease

## 2019-05-16 ENCOUNTER — Other Ambulatory Visit: Payer: Self-pay | Admitting: Internal Medicine

## 2019-06-26 IMAGING — CR PORTABLE CHEST - 1 VIEW
1 series · 1 of 1 positions shown · non-contrast
Comparison: 05/02/2018.

CLINICAL DATA: Generalized chest tightness for the past 2 days
while at rest.

EXAM:
PORTABLE CHEST 1 VIEW

[AP]
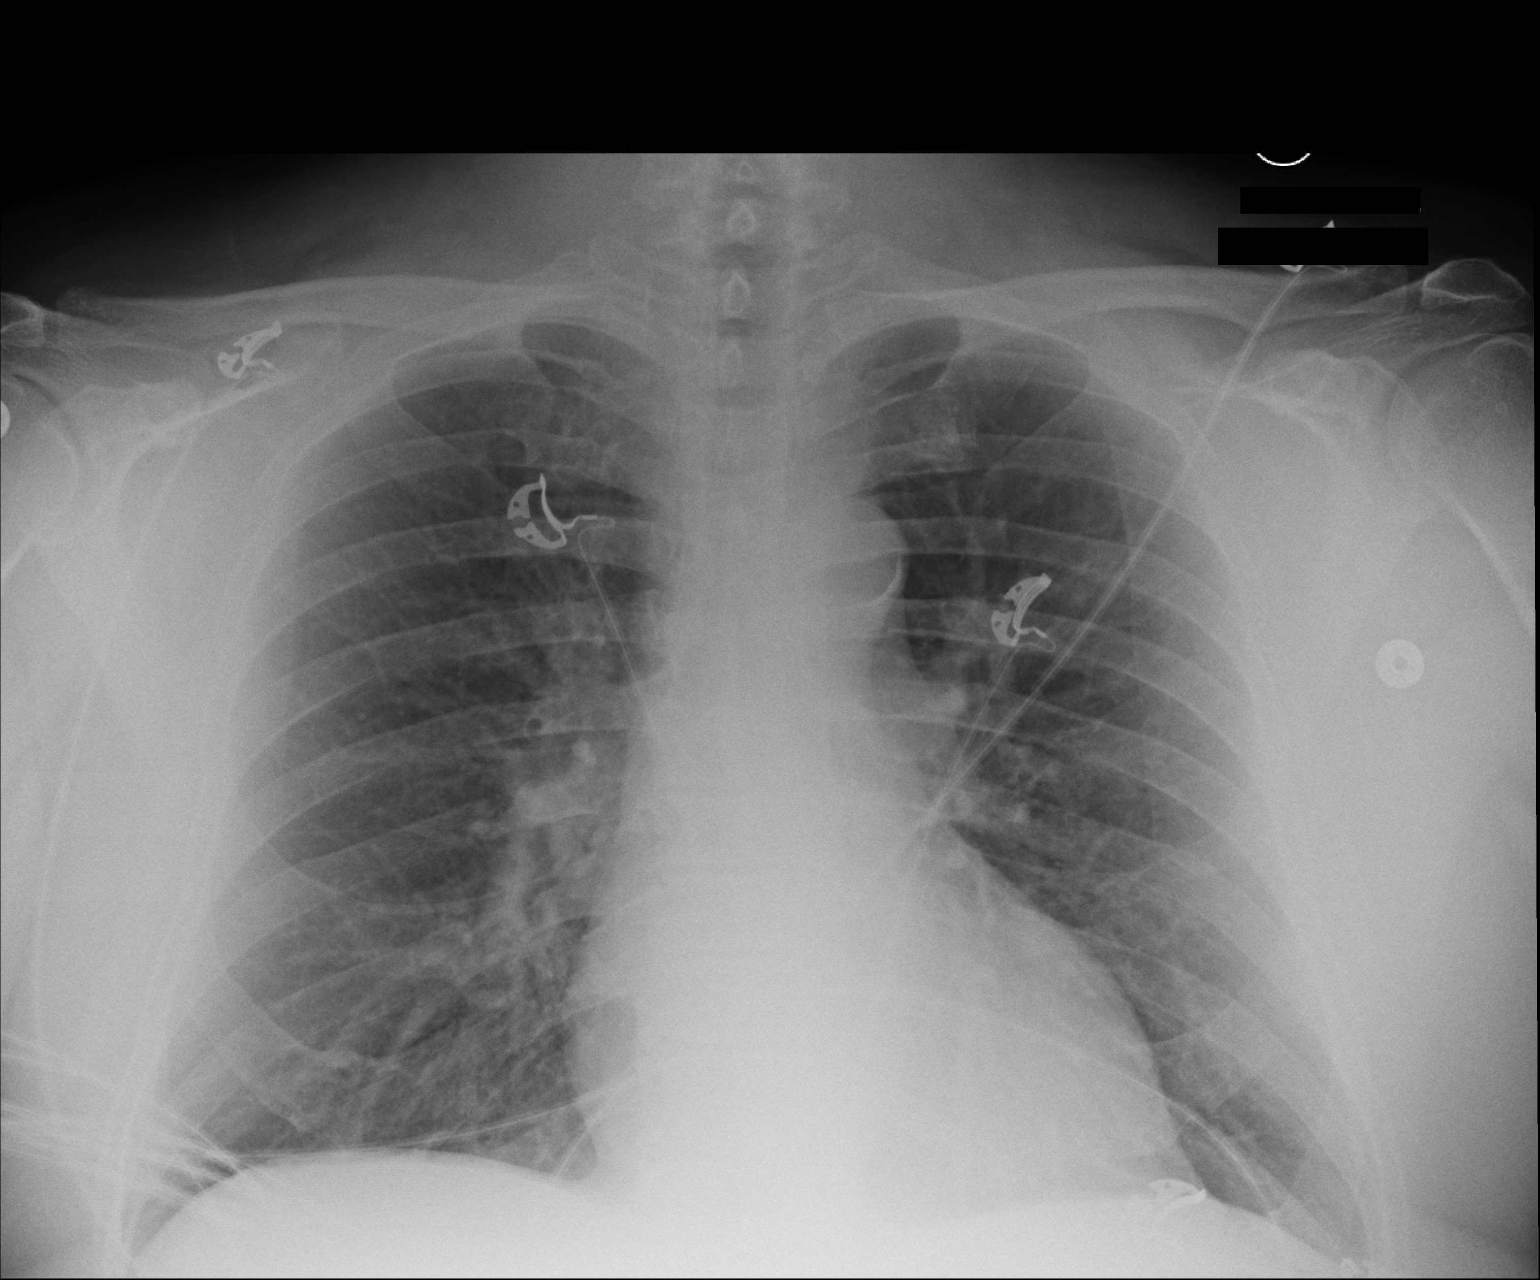

[1 of 1 positions shown; findings below may reference images not displayed]

FINDINGS: Normal sized heart. Clear lungs. Stable mild peribronchial
thickening. Aortic arch calcification. Unremarkable bones.
IMPRESSION: No acute abnormality. Stable mild chronic bronchitic changes.

## 2019-06-29 ENCOUNTER — Other Ambulatory Visit: Payer: Self-pay | Admitting: Internal Medicine

## 2019-08-16 ENCOUNTER — Other Ambulatory Visit: Payer: Self-pay | Admitting: Internal Medicine

## 2019-08-31 ENCOUNTER — Emergency Department (HOSPITAL_COMMUNITY)
Admission: EM | Admit: 2019-08-31 | Discharge: 2019-08-31 | Disposition: A | Discharge status: Home / Self Care | Payer: No Typology Code available for payment source | Attending: Emergency Medicine | Admitting: Emergency Medicine

## 2019-08-31 ENCOUNTER — Encounter (HOSPITAL_COMMUNITY): Payer: Self-pay | Admitting: Emergency Medicine

## 2019-08-31 ENCOUNTER — Other Ambulatory Visit: Payer: Self-pay

## 2019-08-31 DIAGNOSIS — Z79899 Other long term (current) drug therapy: Secondary | ICD-10-CM | POA: Diagnosis not present

## 2019-08-31 DIAGNOSIS — F17210 Nicotine dependence, cigarettes, uncomplicated: Secondary | ICD-10-CM | POA: Diagnosis not present

## 2019-08-31 DIAGNOSIS — I2510 Atherosclerotic heart disease of native coronary artery without angina pectoris: Secondary | ICD-10-CM | POA: Insufficient documentation

## 2019-08-31 DIAGNOSIS — W57XXXA Bitten or stung by nonvenomous insect and other nonvenomous arthropods, initial encounter: Secondary | ICD-10-CM | POA: Insufficient documentation

## 2019-08-31 DIAGNOSIS — Z794 Long term (current) use of insulin: Secondary | ICD-10-CM | POA: Insufficient documentation

## 2019-08-31 DIAGNOSIS — S30861A Insect bite (nonvenomous) of abdominal wall, initial encounter: Secondary | ICD-10-CM | POA: Insufficient documentation

## 2019-08-31 DIAGNOSIS — E119 Type 2 diabetes mellitus without complications: Secondary | ICD-10-CM | POA: Insufficient documentation

## 2019-08-31 DIAGNOSIS — E039 Hypothyroidism, unspecified: Secondary | ICD-10-CM | POA: Diagnosis not present

## 2019-08-31 DIAGNOSIS — I10 Essential (primary) hypertension: Secondary | ICD-10-CM | POA: Diagnosis not present

## 2019-08-31 DIAGNOSIS — Z7982 Long term (current) use of aspirin: Secondary | ICD-10-CM | POA: Insufficient documentation

## 2019-08-31 DIAGNOSIS — T63444A Toxic effect of venom of bees, undetermined, initial encounter: Secondary | ICD-10-CM

## 2019-08-31 DIAGNOSIS — Y999 Unspecified external cause status: Secondary | ICD-10-CM | POA: Insufficient documentation

## 2019-08-31 DIAGNOSIS — Y929 Unspecified place or not applicable: Secondary | ICD-10-CM | POA: Diagnosis not present

## 2019-08-31 DIAGNOSIS — Y939 Activity, unspecified: Secondary | ICD-10-CM | POA: Diagnosis not present

## 2019-08-31 MED ORDER — EPINEPHRINE 0.3 MG/0.3ML IJ SOAJ: 0.3000 mg | INTRAMUSCULAR | 0 refills | Status: DC | PRN

## 2019-08-31 NOTE — ED Triage Notes (Signed)
Patient arrives to ED with complaints of getting stung by a bee in his lower abdomen. Patient states he is allergic and used his epi pen immediately after. Patient denies SOB.

## 2019-09-02 NOTE — ED Provider Notes (Signed)
[facility] EMERGENCY DEPARTMENT Provider Note   CSN: [id number] Arrival date & time: 08/31/19  1236     History Chief Complaint  Patient presents with  . Insect Bite  . Allergic Reaction    Anthony Woods is a 64 y.o. male.  HPI   [name] with bee sting. Happened shortly before arrival while at work. Highly allergic. Immediately used epi pen. Minimal irritation at site of sting on abdomen. Denies gi or respiratory complaints. No dizziness or lightheadedness. No rash.   Past Medical History:  Diagnosis Date  . Allergy to bee sting   . Anxiety   . Arthritis    "right knee" (05/12/2016)  . CAD (coronary artery disease)    a. DES to LCx 04/2016. b. DES to prox Cx 08/2017. c. Stable cath 09/2018 with mild nonobstructive residual disease, normal EF.  Marland Kitchen Headache    "when his sugar goes too high or too low" (05/12/2016)  . HTN (hypertension)   . Hyperlipidemia   . Hypothyroidism   . Macular degeneration   . Obesity   . PAF (paroxysmal atrial fibrillation) ([place])   . Type II diabetes mellitus [facility])     Patient Active Problem List   Diagnosis Date Noted  . Right knee pain 05/01/2019  . DOE (dyspnea on exertion)   . Chest pain 09/08/2018  . Wrist pain, acute, left 04/24/2018  . Coronary artery disease involving native coronary artery of native heart with unstable angina pectoris ([place])   . Stress at work 12/15/2016  . Tobacco dependence 06/04/2016  . Atrial fibrillation with RVR ([facility]) 05/12/2016  . Elevated troponin   . Atrial fibrillation ([place]) 05/11/2016  . Allergic rhinitis 08/12/2015  . Chest pain with moderate risk of acute coronary syndrome 04/14/2015  . Urinary frequency 04/14/2015  . Osteoarthritis of right knee 12/09/2014  . Snoring 09/25/2013  . Rash and nonspecific skin eruption 08/08/2012  . Insomnia 08/08/2012  . Morbid obesity ([facility]) 06/10/2011  . Well adult exam 06/10/2011  . Cough due to angiotensin-converting enzyme inhibitor 10/08/2010  .  SHOULDER PAIN 03/09/2010  . NECK PAIN 03/09/2010  . TOBACCO USER 06/05/2009  . BRONCHITIS, ACUTE 12/13/2007  . Anaphylactic reaction to bee sting 08/02/2007  . Hypothyroidism 04/04/2007  . Type II diabetes mellitus, uncontrolled ([facility]) 04/04/2007  . Essential hypertension 04/04/2007  . Coronary atherosclerosis 04/04/2007  . Edema 04/04/2007    Past Surgical History:  Procedure Laterality Date  . CARDIAC CATHETERIZATION    . COLONOSCOPY    . CORONARY ANGIOPLASTY WITH STENT PLACEMENT  05/12/2016  . CORONARY STENT INTERVENTION N/A 05/12/2016   Procedure: Coronary Stent Intervention;  Surgeon: Jettie Booze, MD;  Location: [facility] CV LAB;  Service: Cardiovascular;  Laterality: N/A;  . CORONARY STENT INTERVENTION N/A 08/31/2017   Procedure: CORONARY STENT INTERVENTION;  Surgeon: Jettie Booze, MD;  Location: [facility];  Service: Cardiovascular;  Laterality: N/A;  . GANGLION CYST EXCISION Left 1980s  . KNEE ARTHROSCOPY Right    Meniscus removal  . LEFT HEART CATH AND CORONARY ANGIOGRAPHY N/A 05/12/2016   Procedure: Left Heart Cath and Coronary Angiography;  Surgeon: Jettie Booze, MD;  Location: [facility];  Service: Cardiovascular;  Laterality: N/A;  . LEFT HEART CATH AND CORONARY ANGIOGRAPHY N/A 08/31/2017   Procedure: LEFT HEART CATH AND CORONARY ANGIOGRAPHY;  Surgeon: Jettie Booze, MD;  Location: [facility] [facility];  Service: Cardiovascular;  Laterality: N/A;  . LEFT HEART CATHETERIZATION WITH CORONARY ANGIOGRAM  N/A 02/04/2012   Procedure: LEFT HEART CATHETERIZATION WITH CORONARY ANGIOGRAM;  Surgeon: Burnell Blanks, MD;  Location: [facility] CATH LAB;  Service: Cardiovascular;  Laterality: N/A;  . POLYPECTOMY    . RIGHT/LEFT HEART CATH AND CORONARY ANGIOGRAPHY N/A 09/28/2018   Procedure: RIGHT/LEFT HEART CATH AND CORONARY ANGIOGRAPHY;  Surgeon: Burnell Blanks, MD;  Location: [facility];  Service: Cardiovascular;  Laterality: N/A;       Family History  Problem Relation Age of Onset  . Diabetes Father   . Cancer Father        stomach  . CVA Father   . Stomach cancer Father   . Diabetes Mother   . Cancer Mother        male ca  . Colon cancer Mother 15  . CAD Maternal Grandmother   . Breast cancer Sister   . Diabetes Sister   . Esophageal cancer Neg Hx   . Rectal cancer Neg Hx     Social History   Tobacco Use  . Smoking status: Current Every Day Smoker    Packs/day: 1.50    Years: 42.00    Pack years: 63.00    Types: Cigarettes  . Smokeless tobacco: Never Used  Vaping Use  . Vaping Use: Never used  Substance Use Topics  . Alcohol use: No  . Drug use: No    Home Medications Prior to Admission medications   Medication Sig Start Date End Date Taking? Authorizing Provider  acetaminophen (TYLENOL) 500 MG tablet Take 1 tablet (500 mg total) by mouth every 6 (six) hours as needed. 04/21/18   Wurst, [place], PA-C  amLODipine (NORVASC) 5 MG tablet Take 1 tablet (5 mg total) by mouth daily. 05/04/19   Burnell Blanks, MD  aspirin EC 81 MG tablet Take 1 tablet (81 mg total) by mouth daily. 10/11/18   Dunn, Nedra Hai, PA-C  atenolol-chlorthalidone (TENORETIC) 100-25 MG tablet TAKE 1 TABLET BY MOUTH DAILY. 06/29/19   Plotnikov, Evie Lacks, MD  Cholecalciferol (VITAMIN D3) 50 MCG (2000 UT) capsule Take 1 capsule (2,000 Units total) by mouth daily. 05/01/19   Plotnikov, Evie Lacks, MD  diphenhydrAMINE (BENADRYL) 25 mg capsule Take 25 mg by mouth as needed (bee stings).    [provider]  EPINEPHrine (EPIPEN 2-PAK) 0.3 mg/0.3 mL IJ SOAJ injection Inject 0.3 mLs (0.3 mg total) into the muscle as needed for anaphylaxis. 08/31/19   Virgel Manifold, MD  EPINEPHRINE 0.3 mg/0.3 mL IJ SOAJ injection INJECT 0.3 MLS (0.3 MG TOTAL) INTO THE MUSCLE ONCE. 08/17/19   Plotnikov, Evie Lacks, MD  escitalopram (LEXAPRO) 10 MG tablet Take 1 tablet (10 mg total) by mouth daily. Follow-up appt due in May must see provider for  future refills 05/17/19   Plotnikov, Evie Lacks, MD  insulin lispro protamine-lispro (HUMALOG 75/25 MIX) (75-25) 100 UNIT/ML SUSP injection Inject 60 Units into the skin daily.    [provider]  Insulin Pen Needle (ULTICARE MICRO PEN NEEDLES) 32G X 4 MM MISC use with insulin pen to inject insulin 2x daily. 11/19/16   Renato Shin, MD  isosorbide mononitrate (IMDUR) 30 MG 24 hr tablet Take 1 tablet (30 mg total) by mouth daily. ** DO NOT CRUSH ** 03/02/19   Simmons, Brittainy M, PA-C  JARDIANCE 25 MG TABS tablet Take 25 mg by mouth daily. 03/28/19   [provider]  levothyroxine (SYNTHROID) 175 MCG tablet TAKE 1 TABLET BY MOUTH DAILY. 12/15/18   Plotnikov, Evie Lacks, MD  linagliptin ([place])  5 MG TABS tablet Take 1 tablet (5 mg total) by mouth daily. 08/12/15   Plotnikov, Evie Lacks, MD  losartan (COZAAR) 100 MG tablet TAKE 1 TABLET BY MOUTH DAILY. 11/28/18   Plotnikov, Evie Lacks, MD  metFORMIN (GLUCOPHAGE) 1000 MG tablet Take 1 tablet (1,000 mg total) by mouth 2 (two) times daily with a meal. 08/12/15   Plotnikov, Evie Lacks, MD  nitroGLYCERIN (NITROSTAT) 0.4 MG SL tablet Place 1 tablet (0.4 mg total) under the tongue every 5 (five) minutes as needed for chest pain. 09/09/18   Aline August, MD  OZEMPIC, 0.25 OR 0.5 MG/DOSE, 2 MG/1.5ML SOPN Inject 0.5 mg into the skin every Monday.  12/08/17   [provider]  potassium chloride SA (K-DUR) 20 MEQ tablet Take 1 tablet (20 mEq total) by mouth 2 (two) times daily. 09/26/18   Dunn, Nedra Hai, PA-C  predniSONE (DELTASONE) 10 MG tablet Take 10 mg by mouth daily as needed (if stung by bee).    [provider]  rosuvastatin (CRESTOR) 20 MG tablet TAKE 1 TABLET BY MOUTH AT BEDTIME. 10/13/18   Burnell Blanks, MD  vitamin B-12 (V-R VITAMIN B-12) 500 MCG tablet Take 1 tablet (500 mcg total) by mouth daily. 05/01/19   Plotnikov, Evie Lacks, MD  XARELTO 20 MG TABS tablet TAKE 1 TABLET BY MOUTH DAILY. 11/28/18   Plotnikov, Evie Lacks, MD      Allergies    Bee venom, Actos [pioglitazone hydrochloride], Lipitor [atorvastatin], and Lisinopril  Review of Systems   Review of Systems All systems reviewed and negative, other than as noted in HPI.  Physical Exam Updated Vital Signs BP 137/73   Pulse (!) 58   Temp 98 F (36.7 C) (Oral)   Resp 16   Ht 5\' 5"  (1.651 m)   Wt 96.2 kg   SpO2 98%   BMI 35.28 kg/m   Physical Exam Vitals and nursing note reviewed.  Constitutional:      General: He is not in acute distress.    Appearance: He is well-developed.  HENT:     Head: Normocephalic and atraumatic.  Eyes:     General:        Right eye: No discharge.        Left eye: No discharge.     Conjunctiva/sclera: Conjunctivae normal.  Cardiovascular:     Rate and Rhythm: Normal rate and regular rhythm.     Heart sounds: Normal heart sounds. No murmur heard.  No friction rub. No gallop.   Pulmonary:     Effort: Pulmonary effort is normal. No respiratory distress.     Breath sounds: Normal breath sounds.  Abdominal:     General: There is no distension.     Palpations: Abdomen is soft.     Tenderness: There is no abdominal tenderness.     Comments: Small indurated area LLQ abdominal wall consistent with recent insect bite/sting.   Musculoskeletal:        General: No tenderness.     Cervical back: Neck supple.  Skin:    General: Skin is warm and dry.  Neurological:     Mental Status: He is alert.  Psychiatric:        Behavior: Behavior normal.        Thought Content: Thought content normal.     ED Results / Procedures / Treatments   Labs (all labs ordered are listed, but only abnormal results are displayed) Labs Reviewed - No data to display  EKG None  Radiology No results found.  Procedures Procedures (including critical care time)  Medications Ordered in ED Medications - No data to display  ED Course  I have reviewed the triage vital signs and the nursing notes.  Pertinent labs & imaging results  that were available during my care of the patient were reviewed by me and considered in my medical decision making (see chart for details).    MDM Rules/Calculators/A&P                          64yM s/p bee sting. Hx of severe reactions previously. Only localized skin symptoms currently and minimal 3 hours later. Refilled epi pens. I think he is fine for DC at this time.  Final Clinical Impression(s) / ED Diagnoses Final diagnoses:  Bee sting, undetermined intent, initial encounter    Rx / DC Orders ED Discharge Orders         Ordered    EPINEPHrine (EPIPEN 2-PAK) 0.3 mg/0.3 mL IJ SOAJ injection  As needed     Discontinue  Reprint     08/31/19 1533           Virgel Manifold, MD 09/02/19 1611

## 2019-09-07 ENCOUNTER — Other Ambulatory Visit: Payer: Self-pay | Admitting: Physician Assistant

## 2019-09-07 ENCOUNTER — Other Ambulatory Visit: Payer: Self-pay | Admitting: Internal Medicine

## 2019-09-14 ENCOUNTER — Other Ambulatory Visit: Payer: Self-pay | Admitting: Cardiovascular Disease

## 2019-10-04 ENCOUNTER — Other Ambulatory Visit: Payer: Self-pay

## 2019-10-04 ENCOUNTER — Encounter: Payer: Self-pay | Admitting: Internal Medicine

## 2019-10-04 ENCOUNTER — Ambulatory Visit: Payer: 59 | Admitting: Internal Medicine

## 2019-10-04 DIAGNOSIS — F17200 Nicotine dependence, unspecified, uncomplicated: Secondary | ICD-10-CM

## 2019-10-04 DIAGNOSIS — I10 Essential (primary) hypertension: Secondary | ICD-10-CM

## 2019-10-04 DIAGNOSIS — E11649 Type 2 diabetes mellitus with hypoglycemia without coma: Secondary | ICD-10-CM | POA: Diagnosis not present

## 2019-10-04 DIAGNOSIS — M1731 Unilateral post-traumatic osteoarthritis, right knee: Secondary | ICD-10-CM

## 2019-10-04 NOTE — Assessment & Plan Note (Signed)
No change 

## 2019-10-04 NOTE — Progress Notes (Signed)
Subjective:  Patient ID: Anthony Woods, male    DOB: 05/17/1955  Age: 64 y.o. MRN: [id number]  CC: No chief complaint on file.   HPI Anthony Woods presents for CAD, HTN, anxiety. He lost 60 lbs on diet. He used Ozempic x 6 mo then stopped. He had labs in May - A1c was 7% or so  Outpatient Medications Prior to Visit  Medication Sig Dispense Refill  . acetaminophen (TYLENOL) 500 MG tablet Take 1 tablet (500 mg total) by mouth every 6 (six) hours as needed. 30 tablet 0  . amLODipine (NORVASC) 5 MG tablet Take 1 tablet (5 mg total) by mouth daily. 90 tablet 3  . aspirin EC 81 MG tablet Take 1 tablet (81 mg total) by mouth daily. 90 tablet 3  . atenolol-chlorthalidone (TENORETIC) 100-25 MG tablet TAKE 1 TABLET BY MOUTH DAILY. 30 tablet 5  . Cholecalciferol (VITAMIN D3) 50 MCG (2000 UT) capsule Take 1 capsule (2,000 Units total) by mouth daily. 100 capsule 3  . diphenhydrAMINE (BENADRYL) 25 mg capsule Take 25 mg by mouth as needed (bee stings).    . EPINEPHrine (EPIPEN 2-PAK) 0.3 mg/0.3 mL IJ SOAJ injection Inject 0.3 mLs (0.3 mg total) into the muscle as needed for anaphylaxis. 2 each 0  . EPINEPHRINE 0.3 mg/0.3 mL IJ SOAJ injection INJECT 0.3 MLS (0.3 MG TOTAL) INTO THE MUSCLE ONCE. 2 each 1  . escitalopram (LEXAPRO) 10 MG tablet TAKE 1 TABLET BY MOUTH DAILY. FOLLOW-UP APPT DUE IN MAY MUST SEE PROVIDER FOR FUTURE REFILLS 30 tablet 3  . insulin lispro protamine-lispro (HUMALOG 75/25 MIX) (75-25) 100 UNIT/ML SUSP injection Inject 60 Units into the skin daily.    . Insulin Pen Needle (ULTICARE MICRO PEN NEEDLES) 32G X 4 MM MISC use with insulin pen to inject insulin 2x daily. 100 each PRN  . isosorbide mononitrate (IMDUR) 30 MG 24 hr tablet Take 1 tablet (30 mg total) by mouth daily. ** DO NOT CRUSH ** 90 tablet 2  . JARDIANCE 25 MG TABS tablet Take 25 mg by mouth daily.    Marland Kitchen levothyroxine (SYNTHROID) 175 MCG tablet TAKE 1 TABLET BY MOUTH DAILY. 30 tablet 11  . losartan (COZAAR) 100 MG tablet  TAKE 1 TABLET BY MOUTH DAILY. 30 tablet 11  . metFORMIN (GLUCOPHAGE) 1000 MG tablet Take 1 tablet (1,000 mg total) by mouth 2 (two) times daily with a meal. 180 tablet 3  . nitroGLYCERIN (NITROSTAT) 0.4 MG SL tablet Place 1 tablet (0.4 mg total) under the tongue every 5 (five) minutes as needed for chest pain. 30 tablet 0  . OZEMPIC, 0.25 OR 0.5 MG/DOSE, 2 MG/1.5ML SOPN Inject 0.5 mg into the skin every Monday.   6  . potassium chloride SA (KLOR-CON) 20 MEQ tablet TAKE 1 TABLET BY MOUTH 2 TIMES DAILY. 60 tablet 11  . predniSONE (DELTASONE) 10 MG tablet Take 10 mg by mouth daily as needed (if stung by bee).    . rosuvastatin (CRESTOR) 20 MG tablet TAKE 1 TABLET BY MOUTH AT BEDTIME. 30 tablet 7  . vitamin B-12 (V-R VITAMIN B-12) 500 MCG tablet Take 1 tablet (500 mcg total) by mouth daily. 100 tablet 3  . XARELTO 20 MG TABS tablet TAKE 1 TABLET BY MOUTH DAILY. 30 tablet 11  . linagliptin (TRADJENTA) 5 MG TABS tablet Take 1 tablet (5 mg total) by mouth daily. (Patient not taking: Reported on 10/04/2019) 90 tablet 3   Facility-Administered Medications Prior to Visit  Medication Dose Route Frequency  Provider Last Rate Last Admin  . sodium chloride flush (NS) 0.9 % injection 3 mL  3 mL Intravenous Q12H Dunn, Dayna N, PA-C        ROS: Review of Systems  Constitutional: Negative for appetite change, fatigue and unexpected weight change.  HENT: Negative for congestion, nosebleeds, sneezing, sore throat and trouble swallowing.   Eyes: Negative for itching and visual disturbance.  Respiratory: Negative for cough.   Cardiovascular: Negative for chest pain, palpitations and leg swelling.  Gastrointestinal: Negative for abdominal distention, blood in stool, diarrhea and nausea.  Genitourinary: Negative for frequency and hematuria.  Musculoskeletal: Positive for arthralgias. Negative for back pain, gait problem, joint swelling and neck pain.  Skin: Negative for rash.  Neurological: Negative for dizziness,  tremors, speech difficulty and weakness.  Psychiatric/Behavioral: Negative for agitation, dysphoric mood, sleep disturbance and suicidal ideas. The patient is not nervous/anxious.     Objective:  BP 118/72 (BP Location: Right Arm, Patient Position: Sitting, Cuff Size: Large)   Pulse (!) 53   Temp 98.3 F (36.8 C) (Oral)   Ht 5\' 5"  (1.651 m)   Wt 215 lb (97.5 kg)   SpO2 96%   BMI 35.78 kg/m   BP Readings from Last 3 Encounters:  10/04/19 118/72  08/31/19 137/73  05/01/19 (!) 150/74    Wt Readings from Last 3 Encounters:  10/04/19 215 lb (97.5 kg)  08/31/19 212 lb (96.2 kg)  05/01/19 236 lb 6 oz (107.2 kg)    Physical Exam Constitutional:      General: He is not in acute distress.    Appearance: He is well-developed. He is obese.     Comments: NAD  Eyes:     Conjunctiva/sclera: Conjunctivae normal.     Pupils: Pupils are equal, round, and reactive to light.  Neck:     Thyroid: No thyromegaly.     Vascular: No JVD.  Cardiovascular:     Rate and Rhythm: Normal rate and regular rhythm.     Heart sounds: Normal heart sounds. No murmur heard.  No friction rub. No gallop.   Pulmonary:     Effort: Pulmonary effort is normal. No respiratory distress.     Breath sounds: Normal breath sounds. No wheezing or rales.  Chest:     Chest wall: No tenderness.  Abdominal:     General: Bowel sounds are normal. There is no distension.     Palpations: Abdomen is soft. There is no mass.     Tenderness: There is no abdominal tenderness. There is no guarding or rebound.  Musculoskeletal:        General: No tenderness. Normal range of motion.     Cervical back: Normal range of motion.  Lymphadenopathy:     Cervical: No cervical adenopathy.  Skin:    General: Skin is warm and dry.     Findings: No rash.  Neurological:     Mental Status: He is alert and oriented to person, place, and time.     Cranial Nerves: No cranial nerve deficit.     Motor: No abnormal muscle tone.      Coordination: Coordination normal.     Gait: Gait normal.     Deep Tendon Reflexes: Reflexes are normal and symmetric.  Psychiatric:        Behavior: Behavior normal.        Thought Content: Thought content normal.        Judgment: Judgment normal.     Lab Results  Component Value Date  WBC 10.2 09/25/2018   HGB 15.0 09/28/2018   HGB 15.0 09/28/2018   HCT 44.0 09/28/2018   HCT 44.0 09/28/2018   PLT 202 09/25/2018   GLUCOSE 295 (H) 05/01/2019   CHOL 135 09/25/2018   TRIG 255 (H) 09/25/2018   HDL 39 (L) 09/25/2018   LDLDIRECT 100.0 12/09/2014   LDLCALC 45 09/25/2018   ALT 21 09/14/2017   AST 19 09/14/2017   NA 137 05/01/2019   K 4.0 05/01/2019   CL 98 05/01/2019   CREATININE 1.10 05/01/2019   BUN 17 05/01/2019   CO2 33 (H) 05/01/2019   TSH 0.66 12/13/2018   PSA 0.41 09/14/2017   INR 1.12 05/12/2016   HGBA1C 9.8 (H) 05/01/2019   MICROALBUR 4.7 (H) 12/06/2013    No results found.  Assessment & Plan:    Walker Kehr, MD

## 2019-10-04 NOTE — Assessment & Plan Note (Signed)
He lost 60 lbs on diet. He used Ozempic x 6 mo then stopped

## 2019-10-04 NOTE — Assessment & Plan Note (Addendum)
He lost 60 lbs on diet. He used Ozempic x 6 mo then stopped. Re-start Ozempic if wt starts to go up.

## 2019-10-04 NOTE — Assessment & Plan Note (Signed)
Take Tenoretic 1/2 tab a day if BP stays low

## 2019-10-04 NOTE — Patient Instructions (Signed)
Take Tenoretic 1/2 tab a day if BP stays low

## 2019-11-09 ENCOUNTER — Other Ambulatory Visit: Payer: Self-pay | Admitting: Cardiology

## 2019-11-16 ENCOUNTER — Other Ambulatory Visit: Payer: Self-pay | Admitting: Internal Medicine

## 2019-11-30 ENCOUNTER — Other Ambulatory Visit: Payer: Self-pay | Admitting: Internal Medicine

## 2019-12-19 ENCOUNTER — Other Ambulatory Visit: Payer: Self-pay | Admitting: Internal Medicine

## 2019-12-28 ENCOUNTER — Other Ambulatory Visit: Payer: Self-pay | Admitting: Internal Medicine

## 2020-01-10 ENCOUNTER — Ambulatory Visit: Payer: 59 | Admitting: Internal Medicine

## 2020-01-10 ENCOUNTER — Encounter: Payer: Self-pay | Admitting: Internal Medicine

## 2020-01-10 ENCOUNTER — Other Ambulatory Visit: Payer: Self-pay

## 2020-01-10 DIAGNOSIS — R0600 Dyspnea, unspecified: Secondary | ICD-10-CM

## 2020-01-10 DIAGNOSIS — I4891 Unspecified atrial fibrillation: Secondary | ICD-10-CM | POA: Diagnosis not present

## 2020-01-10 DIAGNOSIS — F17200 Nicotine dependence, unspecified, uncomplicated: Secondary | ICD-10-CM

## 2020-01-10 DIAGNOSIS — E11649 Type 2 diabetes mellitus with hypoglycemia without coma: Secondary | ICD-10-CM | POA: Diagnosis not present

## 2020-01-10 DIAGNOSIS — I25110 Atherosclerotic heart disease of native coronary artery with unstable angina pectoris: Secondary | ICD-10-CM

## 2020-01-10 DIAGNOSIS — R0609 Other forms of dyspnea: Secondary | ICD-10-CM

## 2020-01-10 DIAGNOSIS — D485 Neoplasm of uncertain behavior of skin: Secondary | ICD-10-CM

## 2020-01-10 DIAGNOSIS — I10 Essential (primary) hypertension: Secondary | ICD-10-CM

## 2020-01-10 LAB — LIPID PANEL
Cholesterol: 171 mg/dL (ref 0–200)
HDL: 43.3 mg/dL (ref >39.00)
LDL Cholesterol: 99 mg/dL (ref 0–99)
NonHDL: 127.27
Total CHOL/HDL Ratio: 4
Triglycerides: 142 mg/dL (ref 0.0–149.0)
VLDL: 28.4 mg/dL (ref 0.0–40.0)

## 2020-01-10 LAB — HEPATIC FUNCTION PANEL
ALT: 17 U/L (ref 0–53)
AST: 17 U/L (ref 0–37)
Albumin: 4.1 g/dL (ref 3.5–5.2)
Alkaline Phosphatase: 65 U/L (ref 39–117)
Bilirubin, Direct: 0 mg/dL (ref 0.0–0.3)
Total Bilirubin: 0.5 mg/dL (ref 0.2–1.2)
Total Protein: 6.6 g/dL (ref 6.0–8.3)

## 2020-01-10 LAB — CBC WITH DIFFERENTIAL/PLATELET
Basophils Absolute: 0.1 10*3/uL (ref 0.0–0.1)
Basophils Relative: 1 % (ref 0.0–3.0)
Eosinophils Absolute: 0.2 10*3/uL (ref 0.0–0.7)
Eosinophils Relative: 3 % (ref 0.0–5.0)
HCT: 49.2 % (ref 39.0–52.0)
Hemoglobin: 16.8 g/dL (ref 13.0–17.0)
Lymphocytes Relative: 27.9 % (ref 12.0–46.0)
Lymphs Abs: 2.2 10*3/uL (ref 0.7–4.0)
MCHC: 34.1 g/dL (ref 30.0–36.0)
MCV: 94.3 fl (ref 78.0–100.0)
Monocytes Absolute: 0.6 10*3/uL (ref 0.1–1.0)
Monocytes Relative: 7.4 % (ref 3.0–12.0)
Neutro Abs: 4.9 10*3/uL (ref 1.4–7.7)
Neutrophils Relative %: 60.7 % (ref 43.0–77.0)
Platelets: 166 10*3/uL (ref 150.0–400.0)
RBC: 5.22 Mil/uL (ref 4.22–5.81)
RDW: 13.4 % (ref 11.5–15.5)
WBC: 8 10*3/uL (ref 4.0–10.5)

## 2020-01-10 LAB — HEMOGLOBIN A1C: Hgb A1c MFr Bld: 11.3 % — ABNORMAL HIGH (ref 4.6–6.5)

## 2020-01-10 NOTE — Assessment & Plan Note (Signed)
1.5 ppd

## 2020-01-10 NOTE — Assessment & Plan Note (Signed)
No CP Crestor  No need for NTG x 2 years

## 2020-01-10 NOTE — Progress Notes (Signed)
Subjective:  Patient ID: Anthony Woods, male    DOB: 1956/03/08  Age: 64 y.o. MRN: [id number]  CC: Follow-up   HPI Anthony Woods presents for DM, obesity, OA f/u [name] had a flu shot Smoking 1.5 ppd   Outpatient Medications Prior to Visit  Medication Sig Dispense Refill  . acetaminophen (TYLENOL) 500 MG tablet Take 1 tablet (500 mg total) by mouth every 6 (six) hours as needed. 30 tablet 0  . amLODipine (NORVASC) 5 MG tablet Take 1 tablet (5 mg total) by mouth daily. 90 tablet 3  . aspirin EC 81 MG tablet Take 1 tablet (81 mg total) by mouth daily. 90 tablet 3  . atenolol-chlorthalidone (TENORETIC) 100-25 MG tablet TAKE 1 TABLET BY MOUTH DAILY. 30 tablet 5  . Cholecalciferol (VITAMIN D3) 50 MCG (2000 UT) capsule Take 1 capsule (2,000 Units total) by mouth daily. 100 capsule 3  . diphenhydrAMINE (BENADRYL) 25 mg capsule Take 25 mg by mouth as needed (bee stings).    . EPINEPHrine (EPIPEN 2-PAK) 0.3 mg/0.3 mL IJ SOAJ injection Inject 0.3 mLs (0.3 mg total) into the muscle as needed for anaphylaxis. 2 each 0  . escitalopram (LEXAPRO) 10 MG tablet TAKE 1 TABLET BY MOUTH DAILY **NEED FOLLOW-UP APPOINTMENT FOR REFILLS** 30 tablet 0  . insulin lispro protamine-lispro (HUMALOG 75/25 MIX) (75-25) 100 UNIT/ML SUSP injection Inject 60 Units into the skin daily.    . Insulin Pen Needle (ULTICARE MICRO PEN NEEDLES) 32G X 4 MM MISC use with insulin pen to inject insulin 2x daily. 100 each PRN  . isosorbide mononitrate (IMDUR) 30 MG 24 hr tablet TAKE 1 TABLET BY MOUTH DAILY. ** DO NOT CRUSH ** 30 tablet 2  . JARDIANCE 25 MG TABS tablet Take 25 mg by mouth daily.    Marland Kitchen levothyroxine (SYNTHROID) 175 MCG tablet TAKE 1 TABLET BY MOUTH DAILY. 30 tablet 11  . losartan (COZAAR) 100 MG tablet TAKE 1 TABLET BY MOUTH DAILY. 30 tablet 11  . metFORMIN (GLUCOPHAGE) 1000 MG tablet Take 1 tablet (1,000 mg total) by mouth 2 (two) times daily with a meal. 180 tablet 3  . nitroGLYCERIN (NITROSTAT) 0.4 MG SL tablet  Place 1 tablet (0.4 mg total) under the tongue every 5 (five) minutes as needed for chest pain. 30 tablet 0  . OZEMPIC, 0.25 OR 0.5 MG/DOSE, 2 MG/1.5ML SOPN Inject 0.5 mg into the skin every Monday.   6  . potassium chloride SA (KLOR-CON) 20 MEQ tablet TAKE 1 TABLET BY MOUTH 2 TIMES DAILY. 60 tablet 11  . predniSONE (DELTASONE) 10 MG tablet Take 10 mg by mouth daily as needed (if stung by bee).    . rosuvastatin (CRESTOR) 20 MG tablet TAKE 1 TABLET BY MOUTH AT BEDTIME. 30 tablet 7  . vitamin B-12 (V-R VITAMIN B-12) 500 MCG tablet Take 1 tablet (500 mcg total) by mouth daily. 100 tablet 3  . XARELTO 20 MG TABS tablet TAKE 1 TABLET BY MOUTH EVERY DAY 30 tablet 11  . EPINEPHRINE 0.3 mg/0.3 mL IJ SOAJ injection INJECT 0.3 MLS (0.3 MG TOTAL) INTO THE MUSCLE ONCE. 2 each 1  . linagliptin (TRADJENTA) 5 MG TABS tablet Take 1 tablet (5 mg total) by mouth daily. (Patient not taking: Reported on 01/10/2020) 90 tablet 3   Facility-Administered Medications Prior to Visit  Medication Dose Route Frequency Provider Last Rate Last Admin  . sodium chloride flush (NS) 0.9 % injection 3 mL  3 mL Intravenous Q12H Dunn, Nedra Hai, PA-C  ROS: Review of Systems  Constitutional: Negative for appetite change, fatigue and unexpected weight change.  HENT: Negative for congestion, nosebleeds, sneezing, sore throat and trouble swallowing.   Eyes: Negative for itching and visual disturbance.  Respiratory: Negative for cough.   Cardiovascular: Negative for chest pain, palpitations and leg swelling.  Gastrointestinal: Negative for abdominal distention, blood in stool, diarrhea and nausea.  Genitourinary: Negative for frequency and hematuria.  Musculoskeletal: Positive for arthralgias. Negative for back pain, gait problem, joint swelling and neck pain.  Skin: Negative for rash.  Neurological: Negative for dizziness, tremors, speech difficulty and weakness.  Psychiatric/Behavioral: Negative for agitation, dysphoric mood  and sleep disturbance. The patient is not nervous/anxious.     Objective:  BP (!) 156/80 (BP Location: Right Arm, Patient Position: Sitting, Cuff Size: Large)   Pulse 65   Temp 98.1 F (36.7 C) (Oral)   Ht 5\' 5"  (1.651 m)   Wt 219 lb 9.6 oz (99.6 kg)   SpO2 95%   BMI 36.54 kg/m   BP Readings from Last 3 Encounters:  01/10/20 (!) 156/80  10/04/19 118/72  08/31/19 137/73    Wt Readings from Last 3 Encounters:  01/10/20 219 lb 9.6 oz (99.6 kg)  10/04/19 215 lb (97.5 kg)  08/31/19 212 lb (96.2 kg)    Physical Exam Constitutional:      General: He is not in acute distress.    Appearance: He is well-developed. He is obese.     Comments: NAD  Eyes:     Conjunctiva/sclera: Conjunctivae normal.     Pupils: Pupils are equal, round, and reactive to light.  Neck:     Thyroid: No thyromegaly.     Vascular: No JVD.  Cardiovascular:     Rate and Rhythm: Normal rate and regular rhythm.     Heart sounds: Normal heart sounds. No murmur heard.  No friction rub. No gallop.   Pulmonary:     Effort: Pulmonary effort is normal. No respiratory distress.     Breath sounds: Normal breath sounds. No wheezing or rales.  Chest:     Chest wall: No tenderness.  Abdominal:     General: Bowel sounds are normal. There is no distension.     Palpations: Abdomen is soft. There is no mass.     Tenderness: There is no abdominal tenderness. There is no guarding or rebound.  Musculoskeletal:        General: No tenderness. Normal range of motion.     Cervical back: Normal range of motion.  Lymphadenopathy:     Cervical: No cervical adenopathy.  Skin:    General: Skin is warm and dry.     Findings: No rash.  Neurological:     Mental Status: He is alert and oriented to person, place, and time.     Cranial Nerves: No cranial nerve deficit.     Motor: No abnormal muscle tone.     Coordination: Coordination normal.     Gait: Gait normal.     Deep Tendon Reflexes: Reflexes are normal and symmetric.    Psychiatric:        Behavior: Behavior normal.        Thought Content: Thought content normal.        Judgment: Judgment normal.   R knee w/pain Warts on B forearms   Lab Results  Component Value Date   WBC 10.2 09/25/2018   HGB 15.0 09/28/2018   HGB 15.0 09/28/2018   HCT 44.0 09/28/2018   HCT 44.0  09/28/2018   PLT 202 09/25/2018   GLUCOSE 295 (H) 05/01/2019   CHOL 135 09/25/2018   TRIG 255 (H) 09/25/2018   HDL 39 (L) 09/25/2018   LDLDIRECT 100.0 12/09/2014   LDLCALC 45 09/25/2018   ALT 21 09/14/2017   AST 19 09/14/2017   NA 137 05/01/2019   K 4.0 05/01/2019   CL 98 05/01/2019   CREATININE 1.10 05/01/2019   BUN 17 05/01/2019   CO2 33 (H) 05/01/2019   TSH 0.66 12/13/2018   PSA 0.41 09/14/2017   INR 1.12 05/12/2016   HGBA1C 9.8 (H) 05/01/2019   MICROALBUR 4.7 (H) 12/06/2013    No results found.  Assessment & Plan:    Walker Kehr, MD

## 2020-01-10 NOTE — Assessment & Plan Note (Signed)
Xarelto

## 2020-01-10 NOTE — Assessment & Plan Note (Signed)
Better w/wt loss: lost 60 lbs

## 2020-01-10 NOTE — Assessment & Plan Note (Signed)
wt loss: lost 60 lbs

## 2020-01-10 NOTE — Assessment & Plan Note (Signed)
Warts vs AKs on B forearms  Cryo or Derm ref offered He will think about it

## 2020-01-10 NOTE — Assessment & Plan Note (Signed)
Take BP meds this am

## 2020-01-10 NOTE — Addendum Note (Signed)
Addended by: Boris Lown B on: 01/10/2020 08:21 AM   Modules accepted: Orders

## 2020-01-11 ENCOUNTER — Other Ambulatory Visit: Payer: Self-pay | Admitting: Internal Medicine

## 2020-01-11 LAB — BASIC METABOLIC PANEL
BUN: 13 mg/dL (ref 6–23)
CO2: 28 mEq/L (ref 19–32)
Calcium: 10 mg/dL (ref 8.4–10.5)
Chloride: 98 mEq/L (ref 96–112)
Creatinine, Ser: 0.92 mg/dL (ref 0.40–1.50)
GFR: 93 mL/min (ref >60.00)
Glucose, Bld: 202 mg/dL — ABNORMAL HIGH (ref 70–99)
Potassium: 4 mEq/L (ref 3.5–5.1)
Sodium: 136 mEq/L (ref 135–145)

## 2020-01-12 ENCOUNTER — Other Ambulatory Visit: Payer: Self-pay | Admitting: Internal Medicine

## 2020-05-12 ENCOUNTER — Ambulatory Visit: Payer: 59 | Admitting: Internal Medicine

## 2020-05-21 ENCOUNTER — Other Ambulatory Visit: Payer: Self-pay | Admitting: Internal Medicine

## 2020-05-22 ENCOUNTER — Other Ambulatory Visit: Payer: Self-pay

## 2020-05-22 ENCOUNTER — Encounter: Payer: Self-pay | Admitting: Internal Medicine

## 2020-05-22 ENCOUNTER — Ambulatory Visit: Payer: 59 | Admitting: Internal Medicine

## 2020-05-22 VITALS — BP 130/70 | HR 51 | Temp 98.1°F | Ht 65.0 in | Wt 210.0 lb

## 2020-05-22 DIAGNOSIS — E11649 Type 2 diabetes mellitus with hypoglycemia without coma: Secondary | ICD-10-CM | POA: Diagnosis not present

## 2020-05-22 DIAGNOSIS — I4891 Unspecified atrial fibrillation: Secondary | ICD-10-CM

## 2020-05-22 DIAGNOSIS — F17200 Nicotine dependence, unspecified, uncomplicated: Secondary | ICD-10-CM

## 2020-05-22 DIAGNOSIS — R0989 Other specified symptoms and signs involving the circulatory and respiratory systems: Secondary | ICD-10-CM | POA: Insufficient documentation

## 2020-05-22 DIAGNOSIS — J449 Chronic obstructive pulmonary disease, unspecified: Secondary | ICD-10-CM

## 2020-05-22 NOTE — Addendum Note (Signed)
Addended by: Earnstine Regal on: 05/22/2020 02:31 PM   Modules accepted: Orders

## 2020-05-22 NOTE — Assessment & Plan Note (Signed)
2 ppd smoker - discussed

## 2020-05-22 NOTE — Assessment & Plan Note (Signed)
2 ppd smoker - discussed Ventolin prn - not using

## 2020-05-22 NOTE — Assessment & Plan Note (Addendum)
Humalog mix, Metformin  Ozempic Labs w/Dr Chalmers Cater in 3/22

## 2020-05-22 NOTE — Assessment & Plan Note (Signed)
Xarelto Atenolol-HCT

## 2020-05-22 NOTE — Assessment & Plan Note (Signed)
Wt Readings from Last 3 Encounters:  05/22/20 210 lb (95.3 kg)  01/10/20 219 lb 9.6 oz (99.6 kg)  10/04/19 215 lb (97.5 kg)   On diet

## 2020-05-22 NOTE — Assessment & Plan Note (Signed)
Carotid Doppler ordered On Xarelto

## 2020-05-22 NOTE — Progress Notes (Signed)
Subjective:  Patient ID: Anthony Woods, male    DOB: Mar 14, 1956  Age: 65 y.o. MRN: [id number]  CC: Follow-up (4 month f/u)   HPI Anthony Woods presents for DM, HTN, CAD, COPD Father died of a CVA about his age Labs w/Dr Balan in 3/22  Outpatient Medications Prior to Visit  Medication Sig Dispense Refill  . acetaminophen (TYLENOL) 500 MG tablet Take 1 tablet (500 mg total) by mouth every 6 (six) hours as needed. 30 tablet 0  . amLODipine (NORVASC) 5 MG tablet Take 1 tablet (5 mg total) by mouth daily. 90 tablet 3  . aspirin EC 81 MG tablet Take 1 tablet (81 mg total) by mouth daily. 90 tablet 3  . atenolol-chlorthalidone (TENORETIC) 100-25 MG tablet TAKE 1 TABLET BY MOUTH DAILY. 30 tablet 5  . Cholecalciferol (VITAMIN D3) 50 MCG (2000 UT) capsule Take 1 capsule (2,000 Units total) by mouth daily. 100 capsule 3  . Cyanocobalamin (B-12) 500 MCG TABS TAKE 1 TABLET (500 MCG TOTAL) BY MOUTH DAILY. 90 tablet 2  . diphenhydrAMINE (BENADRYL) 25 mg capsule Take 25 mg by mouth as needed (bee stings).    . EPINEPHrine (EPIPEN 2-PAK) 0.3 mg/0.3 mL IJ SOAJ injection Inject 0.3 mLs (0.3 mg total) into the muscle as needed for anaphylaxis. 2 each 0  . escitalopram (LEXAPRO) 10 MG tablet TAKE 1 TABLET BY MOUTH DAILY **NEED FOLLOW-UP APPOINTMENT FOR REFILLS** 30 tablet 5  . insulin lispro protamine-lispro (HUMALOG 75/25 MIX) (75-25) 100 UNIT/ML SUSP injection Inject 40 Units into the skin daily.    . Insulin Pen Needle (ULTICARE MICRO PEN NEEDLES) 32G X 4 MM MISC use with insulin pen to inject insulin 2x daily. 100 each PRN  . isosorbide mononitrate (IMDUR) 30 MG 24 hr tablet TAKE 1 TABLET BY MOUTH DAILY. ** DO NOT CRUSH ** 30 tablet 2  . levothyroxine (SYNTHROID) 175 MCG tablet TAKE 1 TABLET BY MOUTH DAILY. 30 tablet 11  . linagliptin (TRADJENTA) 5 MG TABS tablet Take 1 tablet (5 mg total) by mouth daily. 90 tablet 3  . losartan (COZAAR) 100 MG tablet TAKE 1 TABLET BY MOUTH DAILY. 30 tablet 11  .  metFORMIN (GLUCOPHAGE) 1000 MG tablet Take 1 tablet (1,000 mg total) by mouth 2 (two) times daily with a meal. 180 tablet 3  . nitroGLYCERIN (NITROSTAT) 0.4 MG SL tablet Place 1 tablet (0.4 mg total) under the tongue every 5 (five) minutes as needed for chest pain. 30 tablet 0  . OZEMPIC, 0.25 OR 0.5 MG/DOSE, 2 MG/1.5ML SOPN Inject 0.5 mg into the skin every Monday.   6  . potassium chloride SA (KLOR-CON) 20 MEQ tablet TAKE 1 TABLET BY MOUTH 2 TIMES DAILY. 60 tablet 11  . rosuvastatin (CRESTOR) 20 MG tablet TAKE 1 TABLET BY MOUTH AT BEDTIME. 30 tablet 7  . VENTOLIN HFA 108 (90 Base) MCG/ACT inhaler INHALE 1 TO 2 PUFFS INTO THE LUNGS EVERY 6 HOURS AS NEEDED FOR WHEEZING OR SHORTNESS OF BREATH. 18 g 11  . XARELTO 20 MG TABS tablet TAKE 1 TABLET BY MOUTH EVERY DAY 30 tablet 11  . predniSONE (DELTASONE) 10 MG tablet Take 10 mg by mouth daily as needed (if stung by bee).    Marland Kitchen EPINEPHRINE 0.3 mg/0.3 mL IJ SOAJ injection INJECT 0.3 MLS (0.3 MG TOTAL) INTO THE MUSCLE ONCE. 2 each 1  . JARDIANCE 25 MG TABS tablet Take 25 mg by mouth daily. (Patient not taking: Reported on 05/22/2020)    . sodium chloride  flush (NS) 0.9 % injection 3 mL      No facility-administered medications prior to visit.    ROS: Review of Systems  Constitutional: Negative for appetite change, fatigue and unexpected weight change.  HENT: Negative for congestion, nosebleeds, sneezing, sore throat and trouble swallowing.   Eyes: Negative for itching and visual disturbance.  Respiratory: Negative for cough.   Cardiovascular: Negative for chest pain, palpitations and leg swelling.  Gastrointestinal: Negative for abdominal distention, blood in stool, diarrhea and nausea.  Genitourinary: Negative for frequency and hematuria.  Musculoskeletal: Negative for back pain, gait problem, joint swelling and neck pain.  Skin: Negative for rash.  Neurological: Negative for dizziness, tremors, speech difficulty and weakness.   Psychiatric/Behavioral: Negative for agitation, dysphoric mood and sleep disturbance. The patient is not nervous/anxious.     Objective:  BP 130/70 (BP Location: Left Arm)   Pulse (!) 51   Temp 98.1 F (36.7 C) (Oral)   Ht 5\' 5"  (1.651 m)   Wt 210 lb (95.3 kg)   SpO2 95%   BMI 34.95 kg/m   BP Readings from Last 3 Encounters:  05/22/20 130/70  01/10/20 (!) 156/80  10/04/19 118/72    Wt Readings from Last 3 Encounters:  05/22/20 210 lb (95.3 kg)  01/10/20 219 lb 9.6 oz (99.6 kg)  10/04/19 215 lb (97.5 kg)    Physical Exam Constitutional:      General: He is not in acute distress.    Appearance: He is well-developed. He is obese.     Comments: NAD  HENT:     Mouth/Throat:     Mouth: Oropharynx is clear and moist.  Eyes:     Conjunctiva/sclera: Conjunctivae normal.     Pupils: Pupils are equal, round, and reactive to light.  Neck:     Thyroid: No thyromegaly.     Vascular: No JVD.  Cardiovascular:     Rate and Rhythm: Normal rate and regular rhythm.     Pulses: Intact distal pulses.     Heart sounds: Normal heart sounds. No murmur heard. No friction rub. No gallop.   Pulmonary:     Effort: Pulmonary effort is normal. No respiratory distress.     Breath sounds: Normal breath sounds. No wheezing or rales.  Chest:     Chest wall: No tenderness.  Abdominal:     General: Bowel sounds are normal. There is no distension.     Palpations: Abdomen is soft. There is no mass.     Tenderness: There is no abdominal tenderness. There is no guarding or rebound.  Musculoskeletal:        General: No tenderness or edema. Normal range of motion.     Cervical back: Normal range of motion.  Lymphadenopathy:     Cervical: No cervical adenopathy.  Skin:    General: Skin is warm and dry.     Findings: No rash.  Neurological:     Mental Status: He is alert and oriented to person, place, and time.     Cranial Nerves: No cranial nerve deficit.     Motor: No abnormal muscle tone.      Coordination: He displays a negative Romberg sign. Coordination normal.     Gait: Gait normal.     Deep Tendon Reflexes: Reflexes are normal and symmetric.  Psychiatric:        Mood and Affect: Mood and affect normal.        Behavior: Behavior normal.        Thought  Content: Thought content normal.        Judgment: Judgment normal.   B carot bruit  Lab Results  Component Value Date   WBC 8.0 01/10/2020   HGB 16.8 01/10/2020   HCT 49.2 01/10/2020   PLT 166.0 01/10/2020   GLUCOSE 202 (H) 01/10/2020   CHOL 171 01/10/2020   TRIG 142.0 01/10/2020   HDL 43.30 01/10/2020   LDLDIRECT 100.0 12/09/2014   LDLCALC 99 01/10/2020   ALT 17 01/10/2020   AST 17 01/10/2020   NA 136 01/10/2020   K 4.0 01/10/2020   CL 98 01/10/2020   CREATININE 0.92 01/10/2020   BUN 13 01/10/2020   CO2 28 01/10/2020   TSH 0.66 12/13/2018   PSA 0.41 09/14/2017   INR 1.12 05/12/2016   HGBA1C 11.3 (H) 01/10/2020   MICROALBUR 4.7 (H) 12/06/2013    No results found.  Assessment & Plan:    Walker Kehr, MD

## 2020-05-26 ENCOUNTER — Other Ambulatory Visit: Payer: Self-pay | Admitting: Internal Medicine

## 2020-05-29 ENCOUNTER — Other Ambulatory Visit: Payer: Self-pay

## 2020-05-29 ENCOUNTER — Ambulatory Visit (HOSPITAL_COMMUNITY)
Admission: RE | Admit: 2020-05-29 | Discharge: 2020-05-29 | Disposition: A | Discharge status: Home / Self Care | Payer: 59 | Source: Ambulatory Visit | Attending: Cardiology | Admitting: Cardiology

## 2020-05-29 DIAGNOSIS — R0989 Other specified symptoms and signs involving the circulatory and respiratory systems: Secondary | ICD-10-CM | POA: Insufficient documentation

## 2020-06-02 ENCOUNTER — Other Ambulatory Visit: Payer: Self-pay | Admitting: Cardiovascular Disease

## 2020-06-02 NOTE — Progress Notes (Signed)
Cardiology Office Note    Date:  06/04/2020   ID:  [name], DOB 1955-07-24, MRN [id number]   PCP:  Cassandria Anger, MD   [place]  Cardiologist:  Lauree Chandler, MD   Advanced Practice Provider:  No care team member to display Electrophysiologist:  None   [phone]   Chief Complaint  Patient presents with  . Follow-up    History of Present Illness:  Anthony Woods is a 65 y.o. male with history of paroxysmal atrial fibrillation, CAD status post DES to the circumflex 04/2016, DES to the proximal circumflex 08/31/2017 other stent patent, mild disease in the LAD and RCA with normal LVEF, cath 09/2018 mild LAD and RCA disease patent circumflex stents HTN, DM, obesity and tobacco abuse.  Patient last saw Dr. Angelena Form 04/30/2019 at which time he was doing well without chest pain but was still smoking.  Sleep study ordered because of daytime fatigue and snoring but never done.  Patient comes in for f/u. Has chronic DOE associated with dizziness that is unchanged. Smoking 2 ppd. Denies chest pain, palpitations but has edema at the end of the day. Walks 10 min daily but has knee problems. Carotid dopplers normal 05/29/20. A1C 12.9. was forgetting to take meds. LDL 99 in Oct. Was missing his meds every other day. Says he's back on track now.   Past Medical History:  Diagnosis Date  . Allergy to bee sting   . Anxiety   . Arthritis    "right knee" (05/12/2016)  . CAD (coronary artery disease)    a. DES to LCx 04/2016. b. DES to prox Cx 08/2017. c. Stable cath 09/2018 with mild nonobstructive residual disease, normal EF.  Marland Kitchen Headache    "when his sugar goes too high or too low" (05/12/2016)  . HTN (hypertension)   . Hyperlipidemia   . Hypothyroidism   . Macular degeneration   . Obesity   . PAF (paroxysmal atrial fibrillation) ([place])   . Type II diabetes mellitus ([place])     Past Surgical History:  Procedure Laterality Date  . CARDIAC  CATHETERIZATION    . COLONOSCOPY    . CORONARY ANGIOPLASTY WITH STENT PLACEMENT  05/12/2016  . CORONARY STENT INTERVENTION N/A 05/12/2016   Procedure: Coronary Stent Intervention;  Surgeon: Jettie Booze, MD;  Location: [facility];  Service: Cardiovascular;  Laterality: N/A;  . CORONARY STENT INTERVENTION N/A 08/31/2017   Procedure: CORONARY STENT INTERVENTION;  Surgeon: Jettie Booze, MD;  Location: [facility];  Service: Cardiovascular;  Laterality: N/A;  . GANGLION CYST EXCISION Left 1980s  . KNEE ARTHROSCOPY Right    Meniscus removal  . LEFT HEART CATH AND CORONARY ANGIOGRAPHY N/A 05/12/2016   Procedure: Left Heart Cath and Coronary Angiography;  Surgeon: Jettie Booze, MD;  Location: [facility];  Service: Cardiovascular;  Laterality: N/A;  . LEFT HEART CATH AND CORONARY ANGIOGRAPHY N/A 08/31/2017   Procedure: LEFT HEART CATH AND CORONARY ANGIOGRAPHY;  Surgeon: Jettie Booze, MD;  Location: [facility];  Service: Cardiovascular;  Laterality: N/A;  . LEFT HEART CATHETERIZATION WITH CORONARY ANGIOGRAM N/A 02/04/2012   Procedure: LEFT HEART CATHETERIZATION WITH CORONARY ANGIOGRAM;  Surgeon: Burnell Blanks, MD;  Location: [facility];  Service: Cardiovascular;  Laterality: N/A;  . POLYPECTOMY    . RIGHT/LEFT HEART CATH AND CORONARY ANGIOGRAPHY N/A 09/28/2018   Procedure: RIGHT/LEFT HEART CATH AND CORONARY ANGIOGRAPHY;  Surgeon: Burnell Blanks, MD;  Location: [facility];  Service: Cardiovascular;  Laterality: N/A;    Current Medications: Current Meds  Medication Sig  . acetaminophen (TYLENOL) 500 MG tablet Take 1 tablet (500 mg total) by mouth every 6 (six) hours as needed.  Marland Kitchen amLODipine (NORVASC) 5 MG tablet Take 1 tablet (5 mg total) by mouth daily.  Marland Kitchen aspirin EC 81 MG tablet Take 1 tablet (81 mg total) by mouth daily.  . Cholecalciferol (VITAMIN D3) 50 MCG (2000 UT) capsule Take 1 capsule (2,000 Units total) by  mouth daily.  . Cyanocobalamin (B-12) 500 MCG TABS TAKE 1 TABLET (500 MCG TOTAL) BY MOUTH DAILY.  . diphenhydrAMINE (BENADRYL) 25 mg capsule Take 25 mg by mouth as needed (bee stings).  . EPINEPHrine (EPIPEN 2-PAK) 0.3 mg/0.3 mL IJ SOAJ injection Inject 0.3 mLs (0.3 mg total) into the muscle as needed for anaphylaxis.  Marland Kitchen escitalopram (LEXAPRO) 10 MG tablet TAKE 1 TABLET BY MOUTH DAILY **NEED FOLLOW-UP APPOINTMENT FOR REFILLS**  . ezetimibe (ZETIA) 10 MG tablet Take 1 tablet (10 mg total) by mouth daily.  . insulin lispro protamine-lispro (HUMALOG 75/25 MIX) (75-25) 100 UNIT/ML SUSP injection Inject 40 Units into the skin daily.  . Insulin Pen Needle (ULTICARE MICRO PEN NEEDLES) 32G X 4 MM MISC use with insulin pen to inject insulin 2x daily.  . isosorbide mononitrate (IMDUR) 30 MG 24 hr tablet TAKE 1 TABLET BY MOUTH DAILY. ** DO NOT CRUSH **  . levothyroxine (SYNTHROID) 175 MCG tablet TAKE 1 TABLET BY MOUTH DAILY.  Marland Kitchen losartan (COZAAR) 100 MG tablet TAKE 1 TABLET BY MOUTH DAILY.  . metFORMIN (GLUCOPHAGE) 1000 MG tablet Take 1 tablet (1,000 mg total) by mouth 2 (two) times daily with a meal.  . nicotine (NICODERM CQ - DOSED IN MG/24 HOURS) 14 mg/24hr patch Place 1 patch (14 mg total) onto the skin daily. Once daily for 2 weeks..NO SMOKING  . nicotine (NICODERM CQ - DOSED IN MG/24 HOURS) 21 mg/24hr patch Place 1 patch (21 mg total) onto the skin daily. Daily for 6 weeks. NO SMOKING  . nicotine (NICODERM CQ - DOSED IN MG/24 HR) 7 mg/24hr patch Place 1 patch (7 mg total) onto the skin daily. Once daily for 2 weeks NO SMOKING  . nitroGLYCERIN (NITROSTAT) 0.4 MG SL tablet Place 1 tablet (0.4 mg total) under the tongue every 5 (five) minutes as needed for chest pain.  Marland Kitchen OZEMPIC, 0.25 OR 0.5 MG/DOSE, 2 MG/1.5ML SOPN Inject 0.5 mg into the skin every Monday.   . potassium chloride SA (KLOR-CON) 20 MEQ tablet TAKE 1 TABLET BY MOUTH 2 TIMES DAILY.  . rosuvastatin (CRESTOR) 40 MG tablet Take 1 tablet (40 mg  total) by mouth daily.  . VENTOLIN HFA 108 (90 Base) MCG/ACT inhaler INHALE 1 TO 2 PUFFS INTO THE LUNGS EVERY 6 HOURS AS NEEDED FOR WHEEZING OR SHORTNESS OF BREATH.  Marland Kitchen XARELTO 20 MG TABS tablet TAKE 1 TABLET BY MOUTH EVERY DAY  . [DISCONTINUED] rosuvastatin (CRESTOR) 20 MG tablet TAKE 1 TABLET BY MOUTH AT BEDTIME.     Allergies:   Bee venom, Actos [pioglitazone hydrochloride], Lipitor [atorvastatin], Other, Pioglitazone, and Lisinopril   Social History   Socioeconomic History  . Marital status: Married    Spouse name: Not on file  . Number of children: 1  . Years of education: Not on file  . Highest education level: Not on file  Occupational History  . Occupation: [employer]  . Occupation: GRAVE DIGGER    Employer: [employer]  Tobacco Use  . Smoking status: Current Every Day Smoker    Packs/day: 1.50    Years: 42.00    Pack years: 63.00    Types: Cigarettes  . Smokeless tobacco: Never Used  Vaping Use  . Vaping Use: Never used  Substance and Sexual Activity  . Alcohol use: No  . Drug use: No  . Sexual activity: Not Currently  Other Topics Concern  . Not on file  Social History Narrative  . Not on file   Social Determinants of Health   Financial Resource Strain: Not on file  Food Insecurity: Not on file  Transportation Needs: Not on file  Physical Activity: Not on file  Stress: Not on file  Social Connections: Not on file     Family History:  The patient's family history includes Breast cancer in his sister; CAD in his maternal grandmother; CVA in his father; Cancer in his father and mother; Colon cancer (age of onset: 45) in his mother; Diabetes in his father, mother, and sister; Stomach cancer in his father.   ROS:   Please see the history of present illness.    ROS All other systems reviewed and are negative.   PHYSICAL EXAM:   VS:  BP 100/64   Pulse 62   Ht 5\' 5"  (1.651 m)   Wt 210 lb 9.6 oz (95.5 kg)   SpO2 94%   BMI 35.05 kg/m   Physical  Exam  GEN: Obese, in no acute distress  Neck: no JVD, carotid bruits, or masses Cardiac:RRR; no murmurs, rubs, or gallops  Respiratory: decreased breath sounds throughout GI: soft, nontender, nondistended, + BS Ext: without cyanosis, clubbing, or edema, Good distal pulses bilaterally Neuro:  Alert and Oriented x 3 Psych: euthymic mood, full affect  Wt Readings from Last 3 Encounters:  06/04/20 210 lb 9.6 oz (95.5 kg)  05/22/20 210 lb (95.3 kg)  01/10/20 219 lb 9.6 oz (99.6 kg)      Studies/Labs Reviewed:   EKG:  EKG is ordered today.  The ekg ordered today demonstrates  NSR with low voltage no change.  Recent Labs: 01/10/2020: ALT 17; BUN 13; Creatinine, Ser 0.92; Hemoglobin 16.8; Platelets 166.0; Potassium 4.0; Sodium 136   Lipid Panel    Component Value Date/Time   CHOL 171 01/10/2020 0822   CHOL 135 09/25/2018 0801   TRIG 142.0 01/10/2020 0822   TRIG 219 (HH) 02/08/2006 0757   HDL 43.30 01/10/2020 0822   HDL 39 (L) 09/25/2018 0801   CHOLHDL 4 01/10/2020 0822   VLDL 28.4 01/10/2020 0822   LDLCALC 99 01/10/2020 0822   LDLCALC 45 09/25/2018 0801   LDLDIRECT 100.0 12/09/2014 0827    Additional studies/ records that were reviewed today include:  Carotid Dopplers 05/22/2020 Summary:  Right Carotid: The extracranial vessels were near-normal with only minimal  wall                thickening or plaque.   Left Carotid: The extracranial vessels were near-normal with only minimal  wall               thickening or plaque.   Vertebrals:  Bilateral vertebral arteries demonstrate antegrade flow.  Subclavians: Normal flow hemodynamics were seen in bilateral subclavian               arteries.   2D echo 08/2018 IMPRESSIONS     1. The left ventricle has normal systolic function with an ejection  fraction of 60-65%. The cavity size  was normal. Left ventricular diastolic  parameters were normal. No evidence of left ventricular regional wall  motion abnormalities.   2. The  right ventricle has normal systolic function. The cavity was  normal. There is no increase in right ventricular wall thickness. Right  ventricular systolic pressure could not be assessed.   3. Left atrial size was mildly dilated.   4. No evidence of mitral valve stenosis.   5. The aortic valve is tricuspid. No stenosis of the aortic valve.   6. The ascending aorta is normal in size and structure.   *See table(s) above for measurements and observations.     Cardiac cath 09/2018  Mid LAD lesion is 20% stenosed.  Mid RCA lesion is 40% stenosed.  Previously placed Prox Cx stent (unknown type) is widely patent.  Previously placed Mid Cx stent (unknown type) is widely patent.   1. Mild non-obstructive disease in the LAD 2. Mild to moderate stenosis in the mid RCA, unchanged from last cath 3. Patent stents mid Circumflex with no restenosis.  4. Normal filling pressures   Recommendations: No further ischemic testing at this time. CAD is stable. Filling pressures are normal. Dyspnea likely due to his obesity, deconditioning and ongoing tobacco abuse.       Risk Assessment/Calculations:    CHA2DS2-VASc Score = 3  This indicates a 3.2% annual risk of stroke. The patient's score is based upon: CHF History: No HTN History: Yes Diabetes History: Yes Stroke History: No Vascular Disease History: Yes Age Score: 0 Gender Score: 0        ASSESSMENT:    1. Coronary artery disease involving native coronary artery of native heart without angina pectoris   2. Paroxysmal atrial fibrillation (HCC)   3. Essential hypertension   4. Tobacco abuse   5. Morbid obesity ([place])   6. Diabetes mellitus due to underlying condition with hyperosmolarity without coma, without long-term current use of insulin (HCC)   7. Other hyperlipidemia      PLAN:  In order of problems listed above: CAD status post DES to the circumflex 04/2016, DES to the proximal circumflex 08/31/2017 other stent patent,  mild disease in the LAD and RCA with normal LVEF, cath 09/2018 mild LAD and RCA disease patent circumflex stents -no chest pain. Was missing meds every other day but back on track  PAF on Xarelto and beta-blocker-discussed importance of compliance. Labs stable in Oct. Will recheck today  Hypertension BP controlled  Tobacco abuse smoking 2 ppd. Long discussion about smoking cessation. Willing to try nicotine patch.  Morbid obesity-has lost 70 lbs in past year with portion control and diabetes meds.  DM2 on statin. Recent A1C 12.9-wasn't taking meds properly but now on track  HLD LDL 99 in Oct. On crestor 20 mg daily. Will add zetia 10 mg daily.   Shared Decision Making/Informed Consent        Medication Adjustments/Labs and Tests Ordered: Current medicines are reviewed at length with the patient today.  Concerns regarding medicines are outlined above.  Medication changes, Labs and Tests ordered today are listed in the Patient Instructions below. Patient Instructions   Medication Instructions:  1) Start Nicotine patch, 21 mg patch daily for 6 weeks, then 14 mg patch daily for 2 weeks, then 7 mg patch daily for 2 weeks *No smoking while wearing the patch*   2) Increase Crestor to 40 mg daily   3) Start Ezetimibe (Zetia) 10 mg daily   *If you need a refill on your  cardiac medications before your next appointment, please call your pharmacy*   Lab Work: Lipids, Cmp, Cbc- Today   Your physician recommends that you return for a FASTING lipid profile on 09/04/2020, Lab is open from 7:30 AM to 4:30 PM  If you have labs (blood work) drawn today and your tests are completely normal, you will receive your results only by: Marland Kitchen MyChart Message (if you have MyChart) OR . A paper copy in the mail If you have any lab test that is abnormal or we need to change your treatment, we will call you to review the results.   Testing/Procedures: None ordered    Follow-Up: At [facility], you and  your health needs are our priority.  As part of our continuing mission to provide you with exceptional heart care, we have created designated Provider Care Teams.  These Care Teams include your primary Cardiologist (physician) and Advanced Practice Providers (APPs -  Physician Assistants and Nurse Practitioners) who all work together to provide you with the care you need, when you need it.  We recommend signing up for the patient portal called "MyChart".  Sign up information is provided on this After Visit Summary.  MyChart is used to connect with patients for Virtual Visits (Telemedicine).  Patients are able to view lab/test results, encounter notes, upcoming appointments, etc.  Non-urgent messages can be sent to your provider as well.   To learn more about what you can do with MyChart, go to [email or web address].    Your next appointment:   12 month(s)  The format for your next appointment:   In Person  Provider:   You may see Lauree Chandler, MD or one of the following Advanced Practice Providers on your designated Care Team:    Melina Copa, PA-C  Ermalinda Barrios, PA-C    Other Instructions  Steps to Quit Smoking Smoking tobacco is the leading cause of preventable death. It can affect almost every organ in the body. Smoking puts you and those around you at risk for developing many serious chronic diseases. Quitting smoking can be difficult, but it is one of the best things that you can do for your health. It is never too late to quit. How do I get ready to quit? When you decide to quit smoking, create a plan to help you succeed. Before you quit:  Pick a date to quit. Set a date within the next 2 weeks to give you time to prepare.  Write down the reasons why you are quitting. Keep this list in places where you will see it often.  Tell your family, friends, and co-workers that you are quitting. Support from your loved ones can make quitting easier.  Talk with your health care  provider about your options for quitting smoking.  Find out what treatment options are covered by your health insurance.  Identify people, places, things, and activities that make you want to smoke (triggers). Avoid them. What first steps can I take to quit smoking?  Throw away all cigarettes at home, at work, and in your car.  Throw away smoking accessories, such as Scientist, research (medical).  Clean your car. Make sure to empty the ashtray.  Clean your home, including curtains and carpets. What strategies can I use to quit smoking? Talk with your health care provider about combining strategies, such as taking medicines while you are also receiving in-person counseling. Using these two strategies together makes you more likely to succeed in quitting than if you used either  strategy on its own.  If you are pregnant or breastfeeding, talk with your health care provider about finding counseling or other support strategies to quit smoking. Do not take medicine to help you quit smoking unless your health care provider tells you to do so. To quit smoking: Quit right away  Quit smoking completely, instead of gradually reducing how much you smoke over a period of time. Research shows that stopping smoking right away is more successful than gradually quitting.  Attend in-person counseling to help you build problem-solving skills. You are more likely to succeed in quitting if you attend counseling sessions regularly. Even short sessions of 10 minutes can be effective. Take medicine You may take medicines to help you quit smoking. Some medicines require a prescription and some you can purchase over-the-counter. Medicines may have nicotine in them to replace the nicotine in cigarettes. Medicines may:  Help to stop cravings.  Help to relieve withdrawal symptoms. Your health care provider may recommend:  Nicotine patches, gum, or lozenges.  Nicotine inhalers or sprays.  Non-nicotine medicine that  is taken by mouth. Find resources Find resources and support systems that can help you to quit smoking and remain smoke-free after you quit. These resources are most helpful when you use them often. They include:  Online chats with a Social worker.  Telephone quitlines.  Printed Furniture conservator/restorer.  Support groups or group counseling.  Text messaging programs.  Mobile phone apps or applications. Use apps that can help you stick to your quit plan by providing reminders, tips, and encouragement. There are many free apps for mobile devices as well as websites. Examples include Quit Guide from the [employer] and smokefree.gov   What things can I do to make it easier to quit?  Reach out to your family and friends for support and encouragement. Call telephone quitlines (1-800-QUIT-NOW), reach out to support groups, or work with a counselor for support.  Ask people who smoke to avoid smoking around you.  Avoid places that trigger you to smoke, such as bars, parties, or smoke-break areas at work.  Spend time with people who do not smoke.  Lessen the stress in your life. Stress can be a smoking trigger for some people. To lessen stress, try: ? Exercising regularly. ? Doing deep-breathing exercises. ? Doing yoga. ? Meditating. ? Performing a body scan. This involves closing your eyes, scanning your body from head to toe, and noticing which parts of your body are particularly tense. Try to relax the muscles in those areas.   How will I feel when I quit smoking? Day 1 to 3 weeks Within the first 24 hours of quitting smoking, you may start to feel withdrawal symptoms. These symptoms are usually most noticeable 2-3 days after quitting, but they usually do not last for more than 2-3 weeks. You may experience these symptoms:  Mood swings.  Restlessness, anxiety, or irritability.  Trouble concentrating.  Dizziness.  Strong cravings for sugary foods and nicotine.  Mild weight  gain.  Constipation.  Nausea.  Coughing or a sore throat.  Changes in how the medicines that you take for unrelated issues work in your body.  Depression.  Trouble sleeping (insomnia). Week 3 and afterward After the first 2-3 weeks of quitting, you may start to notice more positive results, such as:  Improved sense of smell and taste.  Decreased coughing and sore throat.  Slower heart rate.  Lower blood pressure.  Clearer skin.  The ability to breathe more easily.  Fewer sick  days. Quitting smoking can be very challenging. Do not get discouraged if you are not successful the first time. Some people need to make many attempts to quit before they achieve long-term success. Do your best to stick to your quit plan, and talk with your health care provider if you have any questions or concerns. Summary  Smoking tobacco is the leading cause of preventable death. Quitting smoking is one of the best things that you can do for your health.  When you decide to quit smoking, create a plan to help you succeed.  Quit smoking right away, not slowly over a period of time.  When you start quitting, seek help from your health care provider, family, or friends. This information is not intended to replace advice given to you by your health care provider. Make sure you discuss any questions you have with your health care provider. Document Revised: 12/01/2018 Document Reviewed: 05/27/2018 Elsevier Patient Education  2021 [place], Ermalinda Barrios, [place]  06/04/2020 9:51 AM    [facility] [address], [place], [place]  [address] Phone: [phone]; Fax: [phone]

## 2020-06-04 ENCOUNTER — Encounter: Payer: Self-pay | Admitting: Physician Assistant

## 2020-06-04 ENCOUNTER — Ambulatory Visit: Payer: 59 | Admitting: Physician Assistant

## 2020-06-04 ENCOUNTER — Other Ambulatory Visit: Payer: Self-pay

## 2020-06-04 VITALS — BP 100/64 | HR 62 | Ht 65.0 in | Wt 210.6 lb

## 2020-06-04 DIAGNOSIS — I10 Essential (primary) hypertension: Secondary | ICD-10-CM

## 2020-06-04 DIAGNOSIS — I2510 Atherosclerotic heart disease of native coronary artery without angina pectoris: Secondary | ICD-10-CM | POA: Diagnosis not present

## 2020-06-04 DIAGNOSIS — Z720 Tobacco use: Secondary | ICD-10-CM

## 2020-06-04 DIAGNOSIS — I480 Paroxysmal atrial fibrillation: Secondary | ICD-10-CM

## 2020-06-04 DIAGNOSIS — E0800 Diabetes mellitus due to underlying condition with hyperosmolarity without nonketotic hyperglycemic-hyperosmolar coma (NKHHC): Secondary | ICD-10-CM

## 2020-06-04 DIAGNOSIS — E7849 Other hyperlipidemia: Secondary | ICD-10-CM

## 2020-06-04 LAB — CBC
Hematocrit: 49.9 % (ref 37.5–51.0)
Hemoglobin: 17.4 g/dL (ref 13.0–17.7)
MCH: 32 pg (ref 26.6–33.0)
MCHC: 34.9 g/dL (ref 31.5–35.7)
MCV: 92 fL (ref 79–97)
Platelets: 233 10*3/uL (ref 150–450)
RBC: 5.43 x10E6/uL (ref 4.14–5.80)
RDW: 12.6 % (ref 11.6–15.4)
WBC: 10.9 10*3/uL — ABNORMAL HIGH (ref 3.4–10.8)

## 2020-06-04 LAB — COMPREHENSIVE METABOLIC PANEL
ALT: 16 IU/L (ref 0–44)
AST: 17 IU/L (ref 0–40)
Albumin/Globulin Ratio: 1.8 (ref 1.2–2.2)
Albumin: 4.3 g/dL (ref 3.8–4.8)
Alkaline Phosphatase: 72 IU/L (ref 44–121)
BUN/Creatinine Ratio: 13 (ref 10–24)
BUN: 14 mg/dL (ref 8–27)
Bilirubin Total: 0.6 mg/dL (ref 0.0–1.2)
CO2: 26 mmol/L (ref 20–29)
Calcium: 10.5 mg/dL — ABNORMAL HIGH (ref 8.6–10.2)
Chloride: 96 mmol/L (ref 96–106)
Creatinine, Ser: 1.07 mg/dL (ref 0.76–1.27)
Globulin, Total: 2.4 g/dL (ref 1.5–4.5)
Glucose: 125 mg/dL — ABNORMAL HIGH (ref 65–99)
Potassium: 3.8 mmol/L (ref 3.5–5.2)
Sodium: 138 mmol/L (ref 134–144)
Total Protein: 6.7 g/dL (ref 6.0–8.5)
eGFR: 78 mL/min/{1.73_m2} (ref >59)

## 2020-06-04 LAB — LIPID PANEL
Chol/HDL Ratio: 4.5 ratio (ref 0.0–5.0)
Cholesterol, Total: 180 mg/dL (ref 100–199)
HDL: 40 mg/dL (ref >39)
LDL Chol Calc (NIH): 110 mg/dL — ABNORMAL HIGH (ref 0–99)
Triglycerides: 172 mg/dL — ABNORMAL HIGH (ref 0–149)
VLDL Cholesterol Cal: 30 mg/dL (ref 5–40)

## 2020-06-04 MED ORDER — NICOTINE 7 MG/24HR TD PT24: 7.0000 mg | MEDICATED_PATCH | Freq: Every day | TRANSDERMAL | 0 refills | Status: DC

## 2020-06-04 MED ORDER — ROSUVASTATIN CALCIUM 40 MG PO TABS: 40.0000 mg | ORAL_TABLET | Freq: Every day | ORAL | 3 refills | Status: DC

## 2020-06-04 MED ORDER — NICOTINE 21 MG/24HR TD PT24: 21.0000 mg | MEDICATED_PATCH | Freq: Every day | TRANSDERMAL | 0 refills | Status: DC

## 2020-06-04 MED ORDER — NICOTINE 14 MG/24HR TD PT24: 14.0000 mg | MEDICATED_PATCH | Freq: Every day | TRANSDERMAL | 0 refills | Status: DC

## 2020-06-04 MED ORDER — EZETIMIBE 10 MG PO TABS: 10.0000 mg | ORAL_TABLET | Freq: Every day | ORAL | 3 refills | Status: DC

## 2020-06-04 NOTE — Patient Instructions (Addendum)
Medication Instructions:  1) Start Nicotine patch, 21 mg patch daily for 6 weeks, then 14 mg patch daily for 2 weeks, then 7 mg patch daily for 2 weeks *No smoking while wearing the patch*   2) Start Ezetimibe (Zetia) 10 mg daily   *If you need a refill on your cardiac medications before your next appointment, please call your pharmacy*   Lab Work: Lipids, Cmp, Cbc- Today   Your physician recommends that you return for a FASTING lipid profile on 09/04/2020, Lab is open from 7:30 AM to 4:30 PM  If you have labs (blood work) drawn today and your tests are completely normal, you will receive your results only by: Marland Kitchen MyChart Message (if you have MyChart) OR . A paper copy in the mail If you have any lab test that is abnormal or we need to change your treatment, we will call you to review the results.   Testing/Procedures: None ordered    Follow-Up: At [facility], you and your health needs are our priority.  As part of our continuing mission to provide you with exceptional heart care, we have created designated Provider Care Teams.  These Care Teams include your primary Cardiologist (physician) and Advanced Practice Providers (APPs -  Physician Assistants and Nurse Practitioners) who all work together to provide you with the care you need, when you need it.  We recommend signing up for the patient portal called "MyChart".  Sign up information is provided on this After Visit Summary.  MyChart is used to connect with patients for Virtual Visits (Telemedicine).  Patients are able to view lab/test results, encounter notes, upcoming appointments, etc.  Non-urgent messages can be sent to your provider as well.   To learn more about what you can do with MyChart, go to [email or web address].    Your next appointment:   12 month(s)  The format for your next appointment:   In Person  Provider:   You may see Lauree Chandler, MD or one of the following Advanced Practice Providers on your  designated Care Team:    Melina Copa, PA-C  Ermalinda Barrios, PA-C    Other Instructions  Steps to Quit Smoking Smoking tobacco is the leading cause of preventable death. It can affect almost every organ in the body. Smoking puts you and those around you at risk for developing many serious chronic diseases. Quitting smoking can be difficult, but it is one of the best things that you can do for your health. It is never too late to quit. How do I get ready to quit? When you decide to quit smoking, create a plan to help you succeed. Before you quit:  Pick a date to quit. Set a date within the next 2 weeks to give you time to prepare.  Write down the reasons why you are quitting. Keep this list in places where you will see it often.  Tell your family, friends, and co-workers that you are quitting. Support from your loved ones can make quitting easier.  Talk with your health care provider about your options for quitting smoking.  Find out what treatment options are covered by your health insurance.  Identify people, places, things, and activities that make you want to smoke (triggers). Avoid them. What first steps can I take to quit smoking?  Throw away all cigarettes at home, at work, and in your car.  Throw away smoking accessories, such as Scientist, research (medical).  Clean your car. Make sure to empty the ashtray.  Clean your home, including curtains and carpets. What strategies can I use to quit smoking? Talk with your health care provider about combining strategies, such as taking medicines while you are also receiving in-person counseling. Using these two strategies together makes you more likely to succeed in quitting than if you used either strategy on its own.  If you are pregnant or breastfeeding, talk with your health care provider about finding counseling or other support strategies to quit smoking. Do not take medicine to help you quit smoking unless your health care provider  tells you to do so. To quit smoking: Quit right away  Quit smoking completely, instead of gradually reducing how much you smoke over a period of time. Research shows that stopping smoking right away is more successful than gradually quitting.  Attend in-person counseling to help you build problem-solving skills. You are more likely to succeed in quitting if you attend counseling sessions regularly. Even short sessions of 10 minutes can be effective. Take medicine You may take medicines to help you quit smoking. Some medicines require a prescription and some you can purchase over-the-counter. Medicines may have nicotine in them to replace the nicotine in cigarettes. Medicines may:  Help to stop cravings.  Help to relieve withdrawal symptoms. Your health care provider may recommend:  Nicotine patches, gum, or lozenges.  Nicotine inhalers or sprays.  Non-nicotine medicine that is taken by mouth. Find resources Find resources and support systems that can help you to quit smoking and remain smoke-free after you quit. These resources are most helpful when you use them often. They include:  Online chats with a Social worker.  Telephone quitlines.  Printed Furniture conservator/restorer.  Support groups or group counseling.  Text messaging programs.  Mobile phone apps or applications. Use apps that can help you stick to your quit plan by providing reminders, tips, and encouragement. There are many free apps for mobile devices as well as websites. Examples include Quit Guide from the [employer] and smokefree.gov   What things can I do to make it easier to quit?  Reach out to your family and friends for support and encouragement. Call telephone quitlines (1-800-QUIT-NOW), reach out to support groups, or work with a counselor for support.  Ask people who smoke to avoid smoking around you.  Avoid places that trigger you to smoke, such as bars, parties, or smoke-break areas at work.  Spend time with people  who do not smoke.  Lessen the stress in your life. Stress can be a smoking trigger for some people. To lessen stress, try: ? Exercising regularly. ? Doing deep-breathing exercises. ? Doing yoga. ? Meditating. ? Performing a body scan. This involves closing your eyes, scanning your body from head to toe, and noticing which parts of your body are particularly tense. Try to relax the muscles in those areas.   How will I feel when I quit smoking? Day 1 to 3 weeks Within the first 24 hours of quitting smoking, you may start to feel withdrawal symptoms. These symptoms are usually most noticeable 2-3 days after quitting, but they usually do not last for more than 2-3 weeks. You may experience these symptoms:  Mood swings.  Restlessness, anxiety, or irritability.  Trouble concentrating.  Dizziness.  Strong cravings for sugary foods and nicotine.  Mild weight gain.  Constipation.  Nausea.  Coughing or a sore throat.  Changes in how the medicines that you take for unrelated issues work in your body.  Depression.  Trouble sleeping (insomnia). Week 3  and afterward After the first 2-3 weeks of quitting, you may start to notice more positive results, such as:  Improved sense of smell and taste.  Decreased coughing and sore throat.  Slower heart rate.  Lower blood pressure.  Clearer skin.  The ability to breathe more easily.  Fewer sick days. Quitting smoking can be very challenging. Do not get discouraged if you are not successful the first time. Some people need to make many attempts to quit before they achieve long-term success. Do your best to stick to your quit plan, and talk with your health care provider if you have any questions or concerns. Summary  Smoking tobacco is the leading cause of preventable death. Quitting smoking is one of the best things that you can do for your health.  When you decide to quit smoking, create a plan to help you succeed.  Quit smoking  right away, not slowly over a period of time.  When you start quitting, seek help from your health care provider, family, or friends. This information is not intended to replace advice given to you by your health care provider. Make sure you discuss any questions you have with your health care provider. Document Revised: 12/01/2018 Document Reviewed: 05/27/2018 Elsevier Patient Education  [place].

## 2020-06-04 NOTE — Addendum Note (Signed)
Addended by: Mendel Ryder on: 06/04/2020 10:31 AM   Modules accepted: Orders

## 2020-06-06 ENCOUNTER — Telehealth: Payer: Self-pay

## 2020-06-06 NOTE — Telephone Encounter (Signed)
The patient has been notified of the result and verbalized understanding.  All questions (if any) were answered. Britainy Kozub, [facility] 06/06/2020 1:48 PM

## 2020-08-27 ENCOUNTER — Encounter: Payer: Self-pay | Admitting: Internal Medicine

## 2020-08-27 ENCOUNTER — Ambulatory Visit: Payer: 59 | Admitting: Internal Medicine

## 2020-08-27 ENCOUNTER — Other Ambulatory Visit: Payer: Self-pay

## 2020-08-27 DIAGNOSIS — I480 Paroxysmal atrial fibrillation: Secondary | ICD-10-CM

## 2020-08-27 DIAGNOSIS — S86911A Strain of unspecified muscle(s) and tendon(s) at lower leg level, right leg, initial encounter: Secondary | ICD-10-CM | POA: Diagnosis not present

## 2020-08-27 DIAGNOSIS — I10 Essential (primary) hypertension: Secondary | ICD-10-CM | POA: Diagnosis not present

## 2020-08-27 DIAGNOSIS — I209 Angina pectoris, unspecified: Secondary | ICD-10-CM | POA: Diagnosis not present

## 2020-08-27 DIAGNOSIS — E119 Type 2 diabetes mellitus without complications: Secondary | ICD-10-CM

## 2020-08-27 DIAGNOSIS — E11649 Type 2 diabetes mellitus with hypoglycemia without coma: Secondary | ICD-10-CM

## 2020-08-27 NOTE — Assessment & Plan Note (Signed)
Cont w/Xarelto °

## 2020-08-27 NOTE — Assessment & Plan Note (Signed)
He saw Dr Chalmers Cater on Mon - A1c is better On Humalog mix, Metformin, Ozempic

## 2020-08-27 NOTE — Patient Instructions (Signed)
Get a knee compression sleeve

## 2020-08-27 NOTE — Progress Notes (Signed)
Subjective:  Patient ID: [name], male    DOB: June 25, 1955  Age: 65 y.o. MRN: [id number]  CC: Follow-up (3 month f/u)   HPI Ori A Lynde presents for R knee pain - he fell at work 2-3 wks ago - c/o pain and swelling  C/o neck pain  F/u HTN, CADS  Outpatient Medications Prior to Visit  Medication Sig Dispense Refill  . acetaminophen (TYLENOL) 500 MG tablet Take 1 tablet (500 mg total) by mouth every 6 (six) hours as needed. 30 tablet 0  . amLODipine (NORVASC) 5 MG tablet Take 1 tablet (5 mg total) by mouth daily. 90 tablet 3  . aspirin EC 81 MG tablet Take 1 tablet (81 mg total) by mouth daily. 90 tablet 3  . Cholecalciferol (VITAMIN D3) 50 MCG (2000 UT) capsule Take 1 capsule (2,000 Units total) by mouth daily. 100 capsule 3  . Cyanocobalamin (B-12) 500 MCG TABS TAKE 1 TABLET (500 MCG TOTAL) BY MOUTH DAILY. 90 tablet 2  . diphenhydrAMINE (BENADRYL) 25 mg capsule Take 25 mg by mouth as needed (bee stings).    . EPINEPHrine (EPIPEN 2-PAK) 0.3 mg/0.3 mL IJ SOAJ injection Inject 0.3 mLs (0.3 mg total) into the muscle as needed for anaphylaxis. 2 each 0  . escitalopram (LEXAPRO) 10 MG tablet TAKE 1 TABLET BY MOUTH DAILY **NEED FOLLOW-UP APPOINTMENT FOR REFILLS** 30 tablet 5  . ezetimibe (ZETIA) 10 MG tablet Take 1 tablet (10 mg total) by mouth daily. 90 tablet 3  . insulin lispro protamine-lispro (HUMALOG 75/25 MIX) (75-25) 100 UNIT/ML SUSP injection Inject 40 Units into the skin daily.    . Insulin Pen Needle (ULTICARE MICRO PEN NEEDLES) 32G X 4 MM MISC use with insulin pen to inject insulin 2x daily. 100 each PRN  . isosorbide mononitrate (IMDUR) 30 MG 24 hr tablet TAKE 1 TABLET BY MOUTH DAILY. ** DO NOT CRUSH ** 30 tablet 2  . levothyroxine (SYNTHROID) 175 MCG tablet TAKE 1 TABLET BY MOUTH DAILY. 30 tablet 11  . losartan (COZAAR) 100 MG tablet TAKE 1 TABLET BY MOUTH DAILY. 30 tablet 11  . metFORMIN (GLUCOPHAGE) 1000 MG tablet Take 1 tablet (1,000 mg total) by mouth 2 (two) times  daily with a meal. 180 tablet 3  . nicotine (NICODERM CQ - DOSED IN MG/24 HR) 7 mg/24hr patch Place 1 patch (7 mg total) onto the skin daily. Once daily for 2 weeks NO SMOKING 14 patch 0  . nitroGLYCERIN (NITROSTAT) 0.4 MG SL tablet Place 1 tablet (0.4 mg total) under the tongue every 5 (five) minutes as needed for chest pain. 30 tablet 0  . OZEMPIC, 0.25 OR 0.5 MG/DOSE, 2 MG/1.5ML SOPN Inject 0.5 mg into the skin every Monday.   6  . potassium chloride SA (KLOR-CON) 20 MEQ tablet TAKE 1 TABLET BY MOUTH 2 TIMES DAILY. 60 tablet 11  . rosuvastatin (CRESTOR) 20 MG tablet Take 20 mg by mouth daily.    . VENTOLIN HFA 108 (90 Base) MCG/ACT inhaler INHALE 1 TO 2 PUFFS INTO THE LUNGS EVERY 6 HOURS AS NEEDED FOR WHEEZING OR SHORTNESS OF BREATH. 18 g 11  . XARELTO 20 MG TABS tablet TAKE 1 TABLET BY MOUTH EVERY DAY 30 tablet 11  . nicotine (NICODERM CQ - DOSED IN MG/24 HOURS) 14 mg/24hr patch Place 1 patch (14 mg total) onto the skin daily. Once daily for 2 weeks..NO SMOKING 14 patch 0  . nicotine (NICODERM CQ - DOSED IN MG/24 HOURS) 21 mg/24hr patch Place 1  patch (21 mg total) onto the skin daily. Daily for 6 weeks. NO SMOKING 42 patch 0   No facility-administered medications prior to visit.    ROS: Review of Systems  Constitutional: Negative for appetite change, fatigue and unexpected weight change.  HENT: Negative for congestion, nosebleeds, sneezing, sore throat and trouble swallowing.   Eyes: Negative for itching and visual disturbance.  Respiratory: Negative for cough.   Cardiovascular: Negative for chest pain, palpitations and leg swelling.  Gastrointestinal: Negative for abdominal distention, blood in stool, diarrhea and nausea.  Genitourinary: Negative for frequency and hematuria.  Musculoskeletal: Positive for arthralgias, gait problem and neck pain. Negative for back pain and joint swelling.  Skin: Negative for rash.  Neurological: Negative for dizziness, tremors, speech difficulty and  weakness.  Psychiatric/Behavioral: Negative for agitation, dysphoric mood, sleep disturbance and suicidal ideas. The patient is not nervous/anxious.     Objective:  BP 132/70 (BP Location: Left Arm)   Pulse (!) 59   Temp 98.5 F (36.9 C) (Oral)   Ht 5\' 5"  (1.651 m)   Wt 215 lb (97.5 kg)   SpO2 95%   BMI 35.78 kg/m   BP Readings from Last 3 Encounters:  08/27/20 132/70  06/04/20 100/64  05/22/20 130/70    Wt Readings from Last 3 Encounters:  08/27/20 215 lb (97.5 kg)  06/04/20 210 lb 9.6 oz (95.5 kg)  05/22/20 210 lb (95.3 kg)    Physical Exam Constitutional:      General: He is not in acute distress.    Appearance: He is well-developed. He is obese.     Comments: NAD  Eyes:     Conjunctiva/sclera: Conjunctivae normal.     Pupils: Pupils are equal, round, and reactive to light.  Neck:     Thyroid: No thyromegaly.     Vascular: No JVD.  Cardiovascular:     Rate and Rhythm: Normal rate and regular rhythm.     Heart sounds: Normal heart sounds. No murmur heard. No friction rub. No gallop.   Pulmonary:     Effort: Pulmonary effort is normal. No respiratory distress.     Breath sounds: Normal breath sounds. No wheezing or rales.  Chest:     Chest wall: No tenderness.  Abdominal:     General: Bowel sounds are normal. There is no distension.     Palpations: Abdomen is soft. There is no mass.     Tenderness: There is no abdominal tenderness. There is no guarding or rebound.  Musculoskeletal:        General: Tenderness present. Normal range of motion.     Cervical back: Normal range of motion.  Lymphadenopathy:     Cervical: No cervical adenopathy.  Skin:    General: Skin is warm and dry.     Findings: No rash.  Neurological:     Mental Status: He is alert and oriented to person, place, and time.     Cranial Nerves: No cranial nerve deficit.     Motor: No abnormal muscle tone.     Coordination: Coordination normal.     Gait: Gait abnormal.     Deep Tendon  Reflexes: Reflexes are normal and symmetric.  Psychiatric:        Behavior: Behavior normal.        Thought Content: Thought content normal.        Judgment: Judgment normal.    R knee is tender - distal ant thigh   Lab Results  Component Value Date  WBC 10.9 (H) 06/04/2020   HGB 17.4 06/04/2020   HCT 49.9 06/04/2020   PLT 233 06/04/2020   GLUCOSE 125 (H) 06/04/2020   CHOL 180 06/04/2020   TRIG 172 (H) 06/04/2020   HDL 40 06/04/2020   LDLDIRECT 100.0 12/09/2014   LDLCALC 110 (H) 06/04/2020   ALT 16 06/04/2020   AST 17 06/04/2020   NA 138 06/04/2020   K 3.8 06/04/2020   CL 96 06/04/2020   CREATININE 1.07 06/04/2020   BUN 14 06/04/2020   CO2 26 06/04/2020   TSH 0.66 12/13/2018   PSA 0.41 09/14/2017   INR 1.12 05/12/2016   HGBA1C 11.3 (H) 01/10/2020   MICROALBUR 4.7 (H) 12/06/2013    VAS US CAROTID  Result Date: 05/31/2020 Carotid Arterial Duplex Study Indications:       Patient has had episodes of dizziness for about 3 months. He                    denies any other cerebrovascular symptoms. Risk Factors:      Hypertension, hyperlipidemia, Diabetes, current smoker,                    coronary artery disease. Comparison Study:  None Performing Technologist: Alecia Mackin RVT, RDCS (AE), RDMS  Examination Guidelines: A complete evaluation includes B-mode imaging, spectral Doppler, color Doppler, and power Doppler as needed of all accessible portions of each vessel. Bilateral testing is considered an integral part of a complete examination. Limited examinations for reoccurring indications may be performed as noted.  Right Carotid Findings: +----------+--------+--------+--------+------------------+------------------+           PSV cm/sEDV cm/sStenosisPlaque DescriptionComments           +----------+--------+--------+--------+------------------+------------------+ CCA Prox  85      12                                                    +----------+--------+--------+--------+------------------+------------------+ CCA Distal87      14              calcific          intimal thickening +----------+--------+--------+--------+------------------+------------------+ ICA Prox  102     27              focal and calcific                   +----------+--------+--------+--------+------------------+------------------+ ICA Mid   87      24                                                   +----------+--------+--------+--------+------------------+------------------+ ICA Distal35      13                                                   +----------+--------+--------+--------+------------------+------------------+ ECA       115     12  intimal thickening +----------+--------+--------+--------+------------------+------------------+ +----------+--------+-------+----------------+-------------------+           PSV cm/sEDV cmsDescribe        Arm Pressure (mmHG) +----------+--------+-------+----------------+-------------------+ [id number]            Multiphasic, [id number]                 +----------+--------+-------+----------------+-------------------+ +---------+--------+--+--------+--+---------+ VertebralPSV cm/s51EDV cm/s15Antegrade +---------+--------+--+--------+--+---------+  Left Carotid Findings: +----------+--------+--------+--------+------------------+--------+           PSV cm/sEDV cm/sStenosisPlaque DescriptionComments +----------+--------+--------+--------+------------------+--------+ CCA Prox  88      18                                         +----------+--------+--------+--------+------------------+--------+ CCA Distal74      17              focal and calcific         +----------+--------+--------+--------+------------------+--------+ ICA Prox  49      15              focal and calcific          +----------+--------+--------+--------+------------------+--------+ ICA Mid   57      19                                         +----------+--------+--------+--------+------------------+--------+ ICA Distal50      18                                         +----------+--------+--------+--------+------------------+--------+ ECA       129     18              focal and calcific         +----------+--------+--------+--------+------------------+--------+ +----------+--------+--------+----------------+-------------------+           PSV cm/sEDV cm/sDescribe        Arm Pressure (mmHG) +----------+--------+--------+----------------+-------------------+ Subclavian                Multiphasic, [id number]                 +----------+--------+--------+----------------+-------------------+ +---------+--------+--+--------+--+---------+ VertebralPSV cm/s33EDV cm/s13Antegrade +---------+--------+--+--------+--+---------+   Summary: Right Carotid: The extracranial vessels were near-normal with only minimal wall                thickening or plaque. Left Carotid: The extracranial vessels were near-normal with only minimal wall               thickening or plaque. Vertebrals:  Bilateral vertebral arteries demonstrate antegrade flow. Subclavians: Normal flow hemodynamics were seen in bilateral subclavian              arteries. *See table(s) above for measurements and observations.  Electronically signed by Quay Burow MD on 05/31/2020 at 3:48:39 PM.    Final     Assessment & Plan:    Walker Kehr, MD

## 2020-08-27 NOTE — Addendum Note (Signed)
Addended by: Boris Lown B on: 08/27/2020 08:44 AM   Modules accepted: Orders

## 2020-08-27 NOTE — Assessment & Plan Note (Signed)
Controlled BP. On Losartan, Tenoretic

## 2020-08-27 NOTE — Assessment & Plan Note (Signed)
NTG prn - very rare

## 2020-08-27 NOTE — Assessment & Plan Note (Signed)
See a workman comp orthopedist Use compression sleeve

## 2020-09-04 ENCOUNTER — Other Ambulatory Visit: Payer: 59 | Admitting: *Deleted

## 2020-09-04 ENCOUNTER — Other Ambulatory Visit: Payer: Self-pay

## 2020-09-04 DIAGNOSIS — E7849 Other hyperlipidemia: Secondary | ICD-10-CM

## 2020-09-04 LAB — LIPID PANEL
Chol/HDL Ratio: 2.7 ratio (ref 0.0–5.0)
Cholesterol, Total: 129 mg/dL (ref 100–199)
HDL: 47 mg/dL (ref >39)
LDL Chol Calc (NIH): 59 mg/dL (ref 0–99)
Triglycerides: 133 mg/dL (ref 0–149)
VLDL Cholesterol Cal: 23 mg/dL (ref 5–40)

## 2020-10-14 ENCOUNTER — Telehealth: Payer: Self-pay | Admitting: Internal Medicine

## 2020-10-14 NOTE — Telephone Encounter (Signed)
Type of form received: FMLA   Additional comments:   Received by: [place]    Form should be Faxed to: [phone]  Form should be mailed to: originals mail to patient   Is patient requesting call for pickup: n   Form placed in the Provider's box.  *Attach charge sheet.  Provider will determine charge.*  Was patient informed of  7-10 business day turn around (Y/N)? n

## 2020-10-16 NOTE — Telephone Encounter (Signed)
Place FMLA form on MD desk (red folder).Marland KitchenJohny Woods

## 2020-10-16 NOTE — Telephone Encounter (Signed)
MD completed forms faxed and notified pt spouse ready for pick-up.Marland KitchenJohny Chess

## 2020-11-03 ENCOUNTER — Other Ambulatory Visit: Payer: Self-pay | Admitting: Cardiovascular Disease

## 2020-11-07 ENCOUNTER — Other Ambulatory Visit: Payer: Self-pay | Admitting: Internal Medicine

## 2020-12-29 ENCOUNTER — Other Ambulatory Visit: Payer: Self-pay

## 2020-12-29 ENCOUNTER — Encounter: Payer: Self-pay | Admitting: Internal Medicine

## 2020-12-29 ENCOUNTER — Ambulatory Visit: Payer: 59 | Admitting: Internal Medicine

## 2020-12-29 VITALS — BP 132/80 | HR 58 | Temp 98.4°F | Ht 65.0 in | Wt 209.0 lb

## 2020-12-29 DIAGNOSIS — T63441S Toxic effect of venom of bees, accidental (unintentional), sequela: Secondary | ICD-10-CM

## 2020-12-29 DIAGNOSIS — E1169 Type 2 diabetes mellitus with other specified complication: Secondary | ICD-10-CM

## 2020-12-29 DIAGNOSIS — R0609 Other forms of dyspnea: Secondary | ICD-10-CM | POA: Diagnosis not present

## 2020-12-29 DIAGNOSIS — F17200 Nicotine dependence, unspecified, uncomplicated: Secondary | ICD-10-CM

## 2020-12-29 DIAGNOSIS — J449 Chronic obstructive pulmonary disease, unspecified: Secondary | ICD-10-CM | POA: Diagnosis not present

## 2020-12-29 DIAGNOSIS — I4891 Unspecified atrial fibrillation: Secondary | ICD-10-CM | POA: Diagnosis not present

## 2020-12-29 DIAGNOSIS — E669 Obesity, unspecified: Secondary | ICD-10-CM

## 2020-12-29 DIAGNOSIS — Z23 Encounter for immunization: Secondary | ICD-10-CM

## 2020-12-29 DIAGNOSIS — I2510 Atherosclerotic heart disease of native coronary artery without angina pectoris: Secondary | ICD-10-CM

## 2020-12-29 MED ORDER — NITROGLYCERIN 0.4 MG SL SUBL: 0.4000 mg | SUBLINGUAL_TABLET | SUBLINGUAL | 3 refills | Status: DC | PRN

## 2020-12-29 MED ORDER — EPINEPHRINE 0.3 MG/0.3ML IJ SOAJ: 0.3000 mg | INTRAMUSCULAR | 1 refills | Status: DC | PRN

## 2020-12-29 NOTE — Assessment & Plan Note (Signed)
Cont on Xarelto, Atenolol-HCT

## 2020-12-29 NOTE — Assessment & Plan Note (Signed)
He has a kit of Rx prn - EpiPen - Rx renewed, Sudafed, Benadryl, Prednisone  

## 2020-12-29 NOTE — Progress Notes (Signed)
Subjective:  Patient ID: [name], male    DOB: [birth year]  Age: 65 y.o. MRN: [id number]  CC: Follow-up (4 month f/u- Flu shot)   HPI Karem A Hammonds presents for CAD, A fib, DM2 f/u. F/u on smoking - "I'm smoking too much!"  Outpatient Medications Prior to Visit  Medication Sig Dispense Refill   acetaminophen (TYLENOL) 500 MG tablet Take 1 tablet (500 mg total) by mouth every 6 (six) hours as needed. 30 tablet 0   amLODipine (NORVASC) 5 MG tablet Take 1 tablet (5 mg total) by mouth daily. 90 tablet 3   aspirin EC 81 MG tablet Take 1 tablet (81 mg total) by mouth daily. 90 tablet 3   atenolol-chlorthalidone (TENORETIC) 100-25 MG tablet TAKE 1 TABLET BY MOUTH DAILY. 30 tablet 5   Cholecalciferol (VITAMIN D3) 50 MCG (2000 UT) capsule Take 1 capsule (2,000 Units total) by mouth daily. 100 capsule 3   Cyanocobalamin (B-12) 500 MCG TABS TAKE 1 TABLET (500 MCG TOTAL) BY MOUTH DAILY. 90 tablet 2   diphenhydrAMINE (BENADRYL) 25 mg capsule Take 25 mg by mouth as needed (bee stings).     EPINEPHrine (EPIPEN 2-PAK) 0.3 mg/0.3 mL IJ SOAJ injection Inject 0.3 mLs (0.3 mg total) into the muscle as needed for anaphylaxis. 2 each 0   escitalopram (LEXAPRO) 10 MG tablet Take 1 tablet (10 mg total) by mouth daily. 30 tablet 5   ezetimibe (ZETIA) 10 MG tablet Take 1 tablet (10 mg total) by mouth daily. 90 tablet 3   insulin lispro protamine-lispro (HUMALOG 75/25 MIX) (75-25) 100 UNIT/ML SUSP injection Inject 40 Units into the skin daily.     Insulin Pen Needle (ULTICARE MICRO PEN NEEDLES) 32G X 4 MM MISC use with insulin pen to inject insulin 2x daily. 100 each PRN   isosorbide mononitrate (IMDUR) 30 MG 24 hr tablet TAKE 1 TABLET BY MOUTH DAILY. ** DO NOT CRUSH ** 30 tablet 2   levothyroxine (SYNTHROID) 175 MCG tablet TAKE 1 TABLET BY MOUTH DAILY. 30 tablet 11   losartan (COZAAR) 100 MG tablet TAKE 1 TABLET BY MOUTH DAILY. 30 tablet 11   metFORMIN (GLUCOPHAGE) 1000 MG tablet Take 1 tablet (1,000 mg  total) by mouth 2 (two) times daily with a meal. 180 tablet 3   nicotine (NICODERM CQ - DOSED IN MG/24 HR) 7 mg/24hr patch Place 1 patch (7 mg total) onto the skin daily. Once daily for 2 weeks NO SMOKING 14 patch 0   nitroGLYCERIN (NITROSTAT) 0.4 MG SL tablet Place 1 tablet (0.4 mg total) under the tongue every 5 (five) minutes as needed for chest pain. 30 tablet 0   OZEMPIC, 0.25 OR 0.5 MG/DOSE, 2 MG/1.5ML SOPN Inject 0.5 mg into the skin every Monday.   6   potassium chloride SA (KLOR-CON) 20 MEQ tablet TAKE 1 TABLET BY MOUTH 2 TIMES DAILY. 60 tablet 7   rosuvastatin (CRESTOR) 20 MG tablet Take 20 mg by mouth daily.     VENTOLIN HFA 108 (90 Base) MCG/ACT inhaler INHALE 1 TO 2 PUFFS INTO THE LUNGS EVERY 6 HOURS AS NEEDED FOR WHEEZING OR SHORTNESS OF BREATH. 18 g 11   XARELTO 20 MG TABS tablet TAKE 1 TABLET BY MOUTH EVERY DAY 30 tablet 11   No facility-administered medications prior to visit.    ROS: Review of Systems  Constitutional:  Negative for appetite change, fatigue and unexpected weight change.  HENT:  Negative for congestion, nosebleeds, sneezing, sore throat and trouble swallowing.  Eyes:  Negative for itching and visual disturbance.  Respiratory:  Negative for cough.   Cardiovascular:  Negative for chest pain, palpitations and leg swelling.  Gastrointestinal:  Negative for abdominal distention, blood in stool, diarrhea and nausea.  Genitourinary:  Negative for frequency and hematuria.  Musculoskeletal:  Positive for arthralgias. Negative for back pain, gait problem, joint swelling and neck pain.  Skin:  Negative for rash.  Neurological:  Negative for dizziness, tremors, speech difficulty and weakness.  Psychiatric/Behavioral:  Negative for agitation, dysphoric mood and sleep disturbance. The patient is not nervous/anxious.    Objective:  BP 132/80 (BP Location: Left Arm)   Pulse (!) 58   Temp 98.4 F (36.9 C) (Oral)   Ht 5\' 5"  (1.651 m)   Wt 209 lb (94.8 kg)   SpO2 98%    BMI 34.78 kg/m   BP Readings from Last 3 Encounters:  12/29/20 132/80  08/27/20 132/70  06/04/20 100/64    Wt Readings from Last 3 Encounters:  12/29/20 209 lb (94.8 kg)  08/27/20 215 lb (97.5 kg)  06/04/20 210 lb 9.6 oz (95.5 kg)    Physical Exam Constitutional:      General: He is not in acute distress.    Appearance: He is well-developed. He is obese.     Comments: NAD  Eyes:     Conjunctiva/sclera: Conjunctivae normal.     Pupils: Pupils are equal, round, and reactive to light.  Neck:     Thyroid: No thyromegaly.     Vascular: No JVD.  Cardiovascular:     Rate and Rhythm: Normal rate and regular rhythm.     Heart sounds: Normal heart sounds. No murmur heard.   No friction rub. No gallop.  Pulmonary:     Effort: Pulmonary effort is normal. No respiratory distress.     Breath sounds: Normal breath sounds. No wheezing or rales.  Chest:     Chest wall: No tenderness.  Abdominal:     General: Bowel sounds are normal. There is no distension.     Palpations: Abdomen is soft. There is no mass.     Tenderness: There is no abdominal tenderness. There is no guarding or rebound.  Musculoskeletal:        General: Tenderness present. Normal range of motion.     Cervical back: Normal range of motion.  Lymphadenopathy:     Cervical: No cervical adenopathy.  Skin:    General: Skin is warm and dry.     Findings: No rash.  Neurological:     Mental Status: He is alert and oriented to person, place, and time.     Cranial Nerves: No cranial nerve deficit.     Motor: No abnormal muscle tone.     Coordination: Coordination normal.     Gait: Gait normal.     Deep Tendon Reflexes: Reflexes are normal and symmetric.  Psychiatric:        Behavior: Behavior normal.        Thought Content: Thought content normal.        Judgment: Judgment normal.  LS - tender w/ROM  Lab Results  Component Value Date   WBC 10.9 (H) 06/04/2020   HGB 17.4 06/04/2020   HCT 49.9 06/04/2020   PLT  233 06/04/2020   GLUCOSE 125 (H) 06/04/2020   CHOL 129 09/04/2020   TRIG 133 09/04/2020   HDL 47 09/04/2020   LDLDIRECT 100.0 12/09/2014   LDLCALC 59 09/04/2020   ALT 16 06/04/2020   AST  17 06/04/2020   NA 138 06/04/2020   K 3.8 06/04/2020   CL 96 06/04/2020   CREATININE 1.07 06/04/2020   BUN 14 06/04/2020   CO2 26 06/04/2020   TSH 0.66 12/13/2018   PSA 0.41 09/14/2017   INR 1.12 05/12/2016   HGBA1C 11.3 (H) 01/10/2020   MICROALBUR 4.7 (H) 12/06/2013    VAS US CAROTID  Result Date: 05/31/2020 Carotid Arterial Duplex Study Indications:       Patient has had episodes of dizziness for about 3 months. He                    denies any other cerebrovascular symptoms. Risk Factors:      Hypertension, hyperlipidemia, Diabetes, current smoker,                    coronary artery disease. Comparison Study:  None Performing Technologist: Alecia Mackin RVT, RDCS (AE), RDMS  Examination Guidelines: A complete evaluation includes B-mode imaging, spectral Doppler, color Doppler, and power Doppler as needed of all accessible portions of each vessel. Bilateral testing is considered an integral part of a complete examination. Limited examinations for reoccurring indications may be performed as noted.  Right Carotid Findings: +----------+--------+--------+--------+------------------+------------------+           PSV cm/sEDV cm/sStenosisPlaque DescriptionComments           +----------+--------+--------+--------+------------------+------------------+ CCA Prox  85      12                                                   +----------+--------+--------+--------+------------------+------------------+ CCA Distal87      14              calcific          intimal thickening +----------+--------+--------+--------+------------------+------------------+ ICA Prox  102     27              focal and calcific                    +----------+--------+--------+--------+------------------+------------------+ ICA Mid   87      24                                                   +----------+--------+--------+--------+------------------+------------------+ ICA Distal35      13                                                   +----------+--------+--------+--------+------------------+------------------+ ECA       115     12                                intimal thickening +----------+--------+--------+--------+------------------+------------------+ +----------+--------+-------+----------------+-------------------+           PSV cm/sEDV cmsDescribe        Arm Pressure (mmHG) +----------+--------+-------+----------------+-------------------+ [id number]            Multiphasic, [id number]                 +----------+--------+-------+----------------+-------------------+ +---------+--------+--+--------+--+---------+  VertebralPSV cm/s51EDV cm/s15Antegrade +---------+--------+--+--------+--+---------+  Left Carotid Findings: +----------+--------+--------+--------+------------------+--------+           PSV cm/sEDV cm/sStenosisPlaque DescriptionComments +----------+--------+--------+--------+------------------+--------+ CCA Prox  88      18                                         +----------+--------+--------+--------+------------------+--------+ CCA Distal74      17              focal and calcific         +----------+--------+--------+--------+------------------+--------+ ICA Prox  49      15              focal and calcific         +----------+--------+--------+--------+------------------+--------+ ICA Mid   57      19                                         +----------+--------+--------+--------+------------------+--------+ ICA Distal50      18                                         +----------+--------+--------+--------+------------------+--------+ ECA       129      18              focal and calcific         +----------+--------+--------+--------+------------------+--------+ +----------+--------+--------+----------------+-------------------+           PSV cm/sEDV cm/sDescribe        Arm Pressure (mmHG) +----------+--------+--------+----------------+-------------------+ Subclavian                Multiphasic, [id number]                 +----------+--------+--------+----------------+-------------------+ +---------+--------+--+--------+--+---------+ VertebralPSV cm/s33EDV cm/s13Antegrade +---------+--------+--+--------+--+---------+   Summary: Right Carotid: The extracranial vessels were near-normal with only minimal wall                thickening or plaque. Left Carotid: The extracranial vessels were near-normal with only minimal wall               thickening or plaque. Vertebrals:  Bilateral vertebral arteries demonstrate antegrade flow. Subclavians: Normal flow hemodynamics were seen in bilateral subclavian              arteries. *See table(s) above for measurements and observations.  Electronically signed by Quay Burow MD on 05/31/2020 at 3:48:39 PM.    Final     Assessment & Plan:   Problem List Items Addressed This Visit   None     Follow-up: No follow-ups on file.  Walker Kehr, MD

## 2020-12-29 NOTE — Assessment & Plan Note (Signed)
Cont w/wt loss 

## 2020-12-29 NOTE — Assessment & Plan Note (Signed)
Continue on Humalog mix, Metformin, Ozempic

## 2020-12-29 NOTE — Assessment & Plan Note (Signed)
Cont on Xarelto, Atenolol-HCT, ASA, Simvastatin No angina

## 2020-12-29 NOTE — Assessment & Plan Note (Signed)
"  I'm smoking too much!". He has patches at home.Marland KitchenMarland Kitchen

## 2020-12-29 NOTE — Assessment & Plan Note (Signed)
Smoking cessation was discussed. 

## 2021-01-22 ENCOUNTER — Other Ambulatory Visit: Payer: Self-pay | Admitting: Internal Medicine

## 2021-02-13 ENCOUNTER — Other Ambulatory Visit: Payer: Self-pay | Admitting: Internal Medicine

## 2021-05-01 ENCOUNTER — Other Ambulatory Visit: Payer: Self-pay | Admitting: Internal Medicine

## 2021-05-04 ENCOUNTER — Ambulatory Visit: Payer: 59 | Admitting: Internal Medicine

## 2021-05-08 ENCOUNTER — Other Ambulatory Visit: Payer: Self-pay | Admitting: Internal Medicine

## 2021-05-22 ENCOUNTER — Other Ambulatory Visit: Payer: Self-pay | Admitting: Physician Assistant

## 2021-05-22 MED ORDER — ROSUVASTATIN CALCIUM 20 MG PO TABS: 20.0000 mg | ORAL_TABLET | Freq: Every day | ORAL | 0 refills | Status: DC

## 2021-05-26 ENCOUNTER — Encounter: Payer: Self-pay | Admitting: Gastroenterology

## 2021-06-19 ENCOUNTER — Other Ambulatory Visit: Payer: Self-pay | Admitting: Cardiovascular Disease

## 2021-07-03 ENCOUNTER — Other Ambulatory Visit: Payer: Self-pay | Admitting: Cardiovascular Disease

## 2021-07-17 ENCOUNTER — Other Ambulatory Visit: Payer: Self-pay | Admitting: Cardiovascular Disease

## 2021-07-31 ENCOUNTER — Other Ambulatory Visit: Payer: Self-pay | Admitting: Cardiovascular Disease

## 2021-08-13 ENCOUNTER — Other Ambulatory Visit: Payer: Self-pay | Admitting: Cardiovascular Disease

## 2021-08-28 ENCOUNTER — Other Ambulatory Visit: Payer: Self-pay | Admitting: Cardiovascular Disease

## 2021-09-18 ENCOUNTER — Other Ambulatory Visit: Payer: Self-pay | Admitting: Internal Medicine

## 2021-10-16 ENCOUNTER — Other Ambulatory Visit: Payer: Self-pay | Admitting: Internal Medicine

## 2021-12-03 ENCOUNTER — Ambulatory Visit: Payer: 59 | Admitting: Internal Medicine

## 2021-12-07 ENCOUNTER — Ambulatory Visit: Payer: 59 | Admitting: Internal Medicine

## 2022-08-10 ENCOUNTER — Other Ambulatory Visit: Payer: Self-pay

## 2022-08-10 ENCOUNTER — Emergency Department (HOSPITAL_COMMUNITY): Payer: Medicare Other

## 2022-08-10 ENCOUNTER — Emergency Department (HOSPITAL_COMMUNITY)
Admission: EM | Admit: 2022-08-10 | Discharge: 2022-08-10 | Disposition: A | Discharge status: Home / Self Care | Payer: Medicare Other | Source: Home / Self Care | Attending: Emergency Medicine | Admitting: Emergency Medicine

## 2022-08-10 DIAGNOSIS — Z7901 Long term (current) use of anticoagulants: Secondary | ICD-10-CM | POA: Insufficient documentation

## 2022-08-10 DIAGNOSIS — W19XXXA Unspecified fall, initial encounter: Secondary | ICD-10-CM | POA: Insufficient documentation

## 2022-08-10 DIAGNOSIS — S8990XA Unspecified injury of unspecified lower leg, initial encounter: Secondary | ICD-10-CM | POA: Insufficient documentation

## 2022-08-10 DIAGNOSIS — M25559 Pain in unspecified hip: Secondary | ICD-10-CM | POA: Insufficient documentation

## 2022-08-10 DIAGNOSIS — R0782 Intercostal pain: Secondary | ICD-10-CM | POA: Insufficient documentation

## 2022-08-10 DIAGNOSIS — Z79899 Other long term (current) drug therapy: Secondary | ICD-10-CM | POA: Insufficient documentation

## 2022-08-10 DIAGNOSIS — Z7984 Long term (current) use of oral hypoglycemic drugs: Secondary | ICD-10-CM | POA: Insufficient documentation

## 2022-08-10 DIAGNOSIS — E119 Type 2 diabetes mellitus without complications: Secondary | ICD-10-CM | POA: Insufficient documentation

## 2022-08-10 DIAGNOSIS — I10 Essential (primary) hypertension: Secondary | ICD-10-CM | POA: Insufficient documentation

## 2022-08-10 DIAGNOSIS — I639 Cerebral infarction, unspecified: Secondary | ICD-10-CM | POA: Diagnosis not present

## 2022-08-10 DIAGNOSIS — R739 Hyperglycemia, unspecified: Secondary | ICD-10-CM

## 2022-08-10 DIAGNOSIS — Z794 Long term (current) use of insulin: Secondary | ICD-10-CM | POA: Insufficient documentation

## 2022-08-10 DIAGNOSIS — I6349 Cerebral infarction due to embolism of other cerebral artery: Secondary | ICD-10-CM | POA: Diagnosis not present

## 2022-08-10 LAB — BASIC METABOLIC PANEL
Anion gap: 14 (ref 5–15)
BUN: 8 mg/dL (ref 8–23)
CO2: 23 mmol/L (ref 22–32)
Calcium: 10.1 mg/dL (ref 8.9–10.3)
Chloride: 96 mmol/L — ABNORMAL LOW (ref 98–111)
Creatinine, Ser: 0.84 mg/dL (ref 0.61–1.24)
GFR, Estimated: >60 mL/min (ref >60)
Glucose, Bld: 491 mg/dL — ABNORMAL HIGH (ref 70–99)
Potassium: 3.7 mmol/L (ref 3.5–5.1)
Sodium: 133 mmol/L — ABNORMAL LOW (ref 135–145)

## 2022-08-10 LAB — CBC
HCT: 47 % (ref 39.0–52.0)
Hemoglobin: 16.2 g/dL (ref 13.0–17.0)
MCH: 31.5 pg (ref 26.0–34.0)
MCHC: 34.5 g/dL (ref 30.0–36.0)
MCV: 91.4 fL (ref 80.0–100.0)
Platelets: 219 10*3/uL (ref 150–400)
RBC: 5.14 MIL/uL (ref 4.22–5.81)
RDW: 12.2 % (ref 11.5–15.5)
WBC: 6.9 10*3/uL (ref 4.0–10.5)
nRBC: 0 % (ref 0.0–0.2)

## 2022-08-10 LAB — PROTIME-INR
INR: 1 (ref 0.8–1.2)
Prothrombin Time: 13.5 seconds (ref 11.4–15.2)

## 2022-08-10 LAB — CBG MONITORING, ED: Glucose-Capillary: 223 mg/dL — ABNORMAL HIGH (ref 70–99)

## 2022-08-10 MED ORDER — INSULIN ASPART 100 UNIT/ML IJ SOLN
5.0000 [IU] | Freq: Once | INTRAMUSCULAR | Status: AC
  Administered 2022-08-10: 5 [IU] via SUBCUTANEOUS

## 2022-08-10 MED ORDER — LACTATED RINGERS IV BOLUS
1000.0000 mL | Freq: Once | INTRAVENOUS | Status: AC
  Administered 2022-08-10: 1000 mL via INTRAVENOUS

## 2022-08-10 NOTE — ED Provider Notes (Signed)
[place] EMERGENCY DEPARTMENT AT [facility] Provider Note   CSN: [id number] Arrival date & time: 08/10/22  1418     History  Chief Complaint  Patient presents with   Anthony Woods is a 67 y.o. male.  Patient is a 67 year old male with a past medical history of A-fib on Xarelto, hypertension, diabetes presenting to the emergency department with frequent falls.  The patient is here with his wife who states over the last 3 days he has had several falls.  The last fall was today just prior to arrival when he was unable to get himself up so they called 911.  The patient states that he has been feeling lightheaded and dizzy that he thinks is and causing him to fall.  His wife states that he did hit his head and has been little bit more confused than usual.  The patient is also complaining of pain in his hips and right knee.  He denies any chest pain or shortness of breath prior to the fall.  He states that he has not taken any of his medications for the last several days.  The history is provided by the patient and the spouse.  Fall       Home Medications Prior to Admission medications   Medication Sig Start Date End Date Taking? Authorizing Provider  acetaminophen (TYLENOL) 500 MG tablet Take 1 tablet (500 mg total) by mouth every 6 (six) hours as needed. 04/21/18   Wurst, [place], PA-C  amLODipine (NORVASC) 5 MG tablet Take 1 tablet (5 mg total) by mouth daily. 05/04/19   Kathleene Hazel, MD  aspirin EC 81 MG tablet Take 1 tablet (81 mg total) by mouth daily. 10/11/18   Dunn, Tacey Ruiz, PA-C  atenolol-chlorthalidone (TENORETIC) 100-25 MG tablet TAKE 1 TABLET BY MOUTH DAILY. 05/11/21   Plotnikov, Georgina Quint, MD  Cholecalciferol (VITAMIN D3) 50 MCG (2000 UT) capsule Take 1 capsule (2,000 Units total) by mouth daily. 05/26/20   Plotnikov, Georgina Quint, MD  Cyanocobalamin (B-12) 500 MCG TABS TAKE 1 TABLET (500 MCG TOTAL) BY MOUTH DAILY. 02/16/21   Plotnikov, Georgina Quint, MD   diphenhydrAMINE (BENADRYL) 25 mg capsule Take 25 mg by mouth as needed (bee stings).    [provider]  EPINEPHrine (EPIPEN 2-PAK) 0.3 mg/0.3 mL IJ SOAJ injection Inject 0.3 mg into the muscle as needed for anaphylaxis. 12/29/20   Plotnikov, Georgina Quint, MD  escitalopram (LEXAPRO) 10 MG tablet TAKE 1 TABLET (10 MG TOTAL) BY MOUTH DAILY. ANNUAL APPT DUE IN JUNE MUST SEE PROVIDER FOR FUTURE REFILLS 10/16/21   Plotnikov, Georgina Quint, MD  ezetimibe (ZETIA) 10 MG tablet TAKE 1 TABLET BY MOUTH DAILY. 07/17/21   Kathleene Hazel, MD  insulin lispro protamine-lispro (HUMALOG 75/25 MIX) (75-25) 100 UNIT/ML SUSP injection Inject 40 Units into the skin daily.    [provider]  Insulin Pen Needle (ULTICARE MICRO PEN NEEDLES) 32G X 4 MM MISC use with insulin pen to inject insulin 2x daily. 11/19/16   Romero Belling, MD  isosorbide mononitrate (IMDUR) 30 MG 24 hr tablet TAKE 1 TABLET BY MOUTH DAILY. ** DO NOT CRUSH ** 11/09/19   Robbie Lis M, PA-C  levothyroxine (SYNTHROID) 175 MCG tablet TAKE 1 TABLET BY MOUTH DAILY. 11/18/19   Plotnikov, Georgina Quint, MD  losartan (COZAAR) 100 MG tablet TAKE 1 TABLET BY MOUTH DAILY. 12/03/19   Plotnikov, Georgina Quint, MD  metFORMIN (GLUCOPHAGE) 1000 MG tablet Take 1 tablet (1,000  mg total) by mouth 2 (two) times daily with a meal. 08/12/15   Plotnikov, Georgina Quint, MD  nicotine (NICODERM CQ - DOSED IN MG/24 HR) 7 mg/24hr patch Place 1 patch (7 mg total) onto the skin daily. Once daily for 2 weeks NO SMOKING 06/04/20   Dyann Kief, PA-C  nitroGLYCERIN (NITROSTAT) 0.4 MG SL tablet Place 1 tablet (0.4 mg total) under the tongue every 5 (five) minutes as needed for chest pain. 12/29/20   Plotnikov, Georgina Quint, MD  OZEMPIC, 0.25 OR 0.5 MG/DOSE, 2 MG/1.5ML SOPN Inject 0.5 mg into the skin every Monday.  12/08/17   [provider]  potassium chloride SA (KLOR-CON M) 20 MEQ tablet TAKE 1 TABLET BY MOUTH 2 TIMES DAILY. 07/17/21   Kathleene Hazel, MD   rosuvastatin (CRESTOR) 20 MG tablet TAKE 1 TABLET (20 MG TOTAL) BY MOUTH DAILY. PLEASE MAKE OVERDUE APPT WITH DR. Clifton James BEFORE ANYMORE REFILLS. THANK YOU 3RD ATTEMPT 08/13/21   Kathleene Hazel, MD  VENTOLIN HFA 108 (90 Base) MCG/ACT inhaler INHALE 1 TO 2 PUFFS INTO THE LUNGS EVERY 6 HOURS AS NEEDED FOR WHEEZING OR SHORTNESS OF BREATH. 01/13/20   Plotnikov, Georgina Quint, MD  XARELTO 20 MG TABS tablet TAKE 1 TABLET BY MOUTH EVERY DAY 01/22/21   Plotnikov, Georgina Quint, MD      Allergies    Bee venom, Actos [pioglitazone hydrochloride], Lipitor [atorvastatin], Other, Pioglitazone, and Lisinopril    Review of Systems   Review of Systems  Physical Exam Updated Vital Signs BP (!) 152/88   Pulse 86   Temp 98.1 F (36.7 C) (Oral)   Resp 16   Ht 5\' 5"  (1.651 m)   Wt 95 kg   SpO2 98%   BMI 34.85 kg/m  Physical Exam Vitals and nursing note reviewed.  Constitutional:      General: He is not in acute distress.    Appearance: Normal appearance.  HENT:     Head: Normocephalic and atraumatic.     Nose: Nose normal.     Mouth/Throat:     Mouth: Mucous membranes are moist.     Pharynx: Oropharynx is clear.  Eyes:     Extraocular Movements: Extraocular movements intact.     Conjunctiva/sclera: Conjunctivae normal.  Neck:     Comments: No midline neck tenderness Cardiovascular:     Rate and Rhythm: Normal rate and regular rhythm.     Heart sounds: Normal heart sounds.  Pulmonary:     Effort: Pulmonary effort is normal.     Breath sounds: Normal breath sounds.  Abdominal:     General: Abdomen is flat.     Palpations: Abdomen is soft.     Tenderness: There is no abdominal tenderness.  Musculoskeletal:        General: Normal range of motion.     Cervical back: Normal range of motion and neck supple.     Comments: Midline back tenderness Tenderness to palpation of bilateral ribs with no overlying contusion or skin changes Tenderness to palpation of hips though internal/external  rotation is intact bilaterally without pain, pelvis stable Tenderness to palpation of right knee with some mild swelling, no palpable knee joint effusion, no knee joint laxity No bony tenderness to bilateral upper extremities or left lower extremity  Skin:    General: Skin is warm and dry.  Neurological:     General: No focal deficit present.     Mental Status: He is alert and oriented to person, place, and time.  Psychiatric:        Mood and Affect: Mood normal.        Behavior: Behavior normal.     ED Results / Procedures / Treatments   Labs (all labs ordered are listed, but only abnormal results are displayed) Labs Reviewed  BASIC METABOLIC PANEL - Abnormal; Notable for the following components:      Result Value   Sodium 133 (*)    Chloride 96 (*)    Glucose, Bld 491 (*)    All other components within normal limits  CBC  PROTIME-INR    EKG EKG Interpretation  Date/Time:  Tuesday Aug 10 2022 14:43:32 EDT Ventricular Rate:  90 PR Interval:  130 QRS Duration: 92 QT Interval:  371 QTC Calculation: 454 R Axis:   -50 Text Interpretation: Sinus rhythm Ventricular premature complex Left anterior fascicular block Abnormal R-wave progression, early transition Borderline repolarization abnormality No significant change since last tracing Confirmed by Elayne Snare ([id number]) on 08/10/2022 3:11:40 PM  Radiology DG Pelvis 1-2 Views  Result Date: 08/10/2022 CLINICAL DATA:  Status post fall. EXAM: PELVIS - 1-2 VIEW COMPARISON:  None Available. FINDINGS: There is no evidence of pelvic fracture or diastasis. No pelvic bone lesions are seen. Mild to moderate severity degenerative changes seen involving both hips, in the form of joint space narrowing and acetabular sclerosis. IMPRESSION: Degenerative changes involving both hips. Electronically Signed   By: Aram Candela M.D.   On: 08/10/2022 16:06   DG Knee Complete 4 Views Right  Result Date: 08/10/2022 CLINICAL DATA:  Multiple  falls. EXAM: RIGHT KNEE - COMPLETE 4+ VIEW COMPARISON:  June 09, 2005 FINDINGS: A small cortical deformity of indeterminate age is seen involving the medial epicondyle of the distal right femur. There is no evidence of dislocation. Medial and lateral chondrocalcinosis is seen with marked severity medial tibiofemoral joint space narrowing. A small joint effusion is noted. IMPRESSION: Fracture deformity of indeterminate age involving the medial epicondyle of the distal right femur. CT correlation is recommended. Electronically Signed   By: Aram Candela M.D.   On: 08/10/2022 16:05   DG Chest 2 View  Result Date: 08/10/2022 CLINICAL DATA:  Status post multiple falls. EXAM: CHEST - 2 VIEW COMPARISON:  September 08, 2018 FINDINGS: The heart size and mediastinal contours are within normal limits. There is moderate severity calcification of the aortic arch. Mild atelectasis is seen within the bilateral lung bases. There is no evidence of an acute infiltrate, pleural effusion or pneumothorax. The visualized skeletal structures are unremarkable. IMPRESSION: No acute cardiopulmonary disease. Electronically Signed   By: Aram Candela M.D.   On: 08/10/2022 16:03   CT Head Wo Contrast  Result Date: 08/10/2022 CLINICAL DATA:  Head trauma, minor (Age >= 65y); Neck trauma (Age >= 65y) EXAM: CT HEAD WITHOUT CONTRAST CT CERVICAL SPINE WITHOUT CONTRAST TECHNIQUE: Multidetector CT imaging of the head and cervical spine was performed following the standard protocol without intravenous contrast. Multiplanar CT image reconstructions of the cervical spine were also generated. RADIATION DOSE REDUCTION: This exam was performed according to the departmental dose-optimization program which includes automated exposure control, adjustment of the mA and/or kV according to patient size and/or use of iterative reconstruction technique. COMPARISON:  None Available. FINDINGS: CT HEAD FINDINGS Brain: No evidence of acute cortical  infarction, hemorrhage, hydrocephalus, extra-axial collection or mass lesion/mass effect. 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 Vascular: No hyperdense vessel or unexpected calcification.  Skull: Normal. Negative for fracture or focal lesion. Sinuses/Orbits: No middle ear or mastoid effusion. Paranasal sinuses are clear. Orbits are unremarkable. Other: None. CT CERVICAL SPINE FINDINGS Alignment: There is straightening of the normal cervical lordosis. Grade 1 anterolisthesis of C3 on C4. Skull base and vertebrae: No acute fracture. No primary bone lesion or focal pathologic process. Soft tissues and spinal canal: No prevertebral fluid or swelling. No visible canal hematoma. Disc levels:  There is moderate spinal canal stenosis at C5-C6. Upper chest: Negative. Other: Atherosclerotic vascular calcifications. Bilateral tonsilliths. IMPRESSION: 1. No acute intracranial abnormality. 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 2. No acute fracture or traumatic subluxation of the cervical spine. Electronically Signed   By: Lorenza Cambridge M.D.   On: 08/10/2022 15:58   CT Cervical Spine Wo Contrast  Result Date: 08/10/2022 CLINICAL DATA:  Head trauma, minor (Age >= 65y); Neck trauma (Age >= 65y) EXAM: CT HEAD WITHOUT CONTRAST CT CERVICAL SPINE WITHOUT CONTRAST TECHNIQUE: Multidetector CT imaging of the head and cervical spine was performed following the standard protocol without intravenous contrast. Multiplanar CT image reconstructions of the cervical spine were also generated. RADIATION DOSE REDUCTION: This exam was performed according to the departmental dose-optimization program which includes automated exposure control, adjustment of the mA and/or kV according to patient size and/or use of iterative reconstruction technique. COMPARISON:  None  Available. FINDINGS: CT HEAD FINDINGS Brain: No evidence of acute cortical infarction, hemorrhage, hydrocephalus, extra-axial collection or mass lesion/mass effect. 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 Vascular: No hyperdense vessel or unexpected calcification. Skull: Normal. Negative for fracture or focal lesion. Sinuses/Orbits: No middle ear or mastoid effusion. Paranasal sinuses are clear. Orbits are unremarkable. Other: None. CT CERVICAL SPINE FINDINGS Alignment: There is straightening of the normal cervical lordosis. Grade 1 anterolisthesis of C3 on C4. Skull base and vertebrae: No acute fracture. No primary bone lesion or focal pathologic process. Soft tissues and spinal canal: No prevertebral fluid or swelling. No visible canal hematoma. Disc levels:  There is moderate spinal canal stenosis at C5-C6. Upper chest: Negative. Other: Atherosclerotic vascular calcifications. Bilateral tonsilliths. IMPRESSION: 1. No acute intracranial abnormality. 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 2. No acute fracture or traumatic subluxation of the cervical spine. Electronically Signed   By: Lorenza Cambridge M.D.   On: 08/10/2022 15:58    Procedures Procedures    Medications Ordered in ED Medications  lactated ringers bolus 1,000 mL (1,000 mLs Intravenous New Bag/Given 08/10/22 1613)  insulin aspart (novoLOG) injection 5 Units (5 Units Subcutaneous Given 08/10/22 1611)    ED Course/ Medical Decision Making/ A&P Clinical Course as of 08/10/22 1632  Tue Aug 10, 2022  1549 Labs with hyperglycemia, no evidence of DKA. He will be given IVF and insulin. Imaging is pending at this time. [VK]  1615 Xrays with concern for fracture of the R-medial epicondyle of the femur. Orthopedics will be consulted. CT will be performed to further evaluate.  [VK]  1627 I spoke with Dr. Christell Constant of orthopedics who will leave a plan of care note for weight bearing recommendations etc following CT to evaluate for full extent of injury. Patient signed out to Dr. Deretha Emory pending CT. [VK]    Clinical Course User Index [VK] Rexford Maus, DO  Medical Decision Making This patient presents to the ED with chief complaint(s) of dizziness, frequent falls with pertinent past medical history of a fib on Xarelto, HTN, DM which further complicates the presenting complaint. The complaint involves an extensive differential diagnosis and also carries with it a high risk of complications and morbidity.    The differential diagnosis includes considering ICH, mass effect, cervical spine injury, rib fracture, hip fracture, knee fracture, arrhythmia, anemia, dehydration, electrolyte abnormality, no focal neurologic deficits making CVA unlikely  Additional history obtained: Additional history obtained from family Records reviewed Primary Care Documents  ED Course and Reassessment: On patient's arrival to the emergency department he was hemodynamically stable in no acute distress.  EKG on arrival showed normal sinus rhythm without acute ischemic changes.  Patient will have labs performed to evaluate for causes of his dizziness and will have CT of his head and C-spine as well as x-rays to evaluate for traumatic injuries.  He declined any pain medication at this time.  Independent labs interpretation:  The following labs were independently interpreted: hyperglycemia without DKA  Independent visualization of imaging: - I independently visualized the following imaging with scope of interpretation limited to determining acute life threatening conditions related to emergency care: CTH/C-spine, CXR, pelvis XR, R knee XR, which revealed medial epicondyle fracture of R femur otherwise no other acute traumatic injuries  Consultation: - Consulted  or discussed management/test interpretation w/ external professional: orthopedics      Amount and/or Complexity of Data Reviewed Labs: ordered. Radiology: ordered.  Risk Prescription drug management.          Final Clinical Impression(s) / ED Diagnoses Final diagnoses:  None    Rx / DC Orders ED Discharge Orders     None         Rexford Maus, DO 08/10/22 1632

## 2022-08-10 NOTE — Plan of Care (Signed)
Orthopedic Plan of Care  Called about possible right femur fracture on radiographs. CT obtained. I do not see any acute fracture of the distal femur. Patient can be weight bearing as tolerated. Pain controled per ED. Follow up with orthopedics PRN.  London Sheer, MD Orthopedic Surgeon

## 2022-08-10 NOTE — Discharge Instructions (Signed)
Make an appointment to follow back up with his doctor.  CT scan of the knee showed no evidence of any fracture he can weight-bear on it and use it would recommend using walker.  Blood sugar was elevated but came down encouraged him to take his medicines.  Also if needed can make an appointment to follow-up with sports medicine information provided above.  Return for any new or worse symptoms.

## 2022-08-10 NOTE — ED Triage Notes (Signed)
Pt coming from home due to multiple falls per day. Main complaint is the immobility. Pt is on blood thinner, no LOC or no hitting head when falling.  186/100 98 room air  HR 80 Pt is non compliant with all meds.

## 2022-08-10 NOTE — ED Provider Notes (Signed)
Patient's blood sugars improved to 223.  Patient CT of the knee without any acute fractures also read by orthopedics.  And says he can weight-bear.  Will have conversation with patient's wife about going home.  Patient has been resistant to take his meds there was some discussion with the daytime emergency physician that maybe he needs placement.  Chest x-ray had no acute process.  X-ray of the pelvis said just degenerative changes both hips.  Plain x-ray of the right knee raise concerns about fracture deformity however CT rule that out.  CT of head had no acute findings including CT of the spine as well.  Discussion with patient's wife she wants to take him home does not want him placed.  We will discharge him.  Encouraged to take his medicines and encouraged to use his walker she said he has a walker at home.   Vanetta Mulders, MD 08/10/22 [phone]

## 2022-08-11 ENCOUNTER — Telehealth: Payer: Self-pay

## 2022-08-11 ENCOUNTER — Emergency Department (HOSPITAL_COMMUNITY): Payer: Medicare Other

## 2022-08-11 ENCOUNTER — Inpatient Hospital Stay (HOSPITAL_COMMUNITY)
Admission: EM | Admit: 2022-08-11 | Discharge: 2022-08-13 | DRG: 064 | Disposition: A | Discharge status: Rehabilitation Facility | Payer: Medicare Other | Attending: Internal Medicine | Admitting: Internal Medicine

## 2022-08-11 DIAGNOSIS — H3530 Unspecified macular degeneration: Secondary | ICD-10-CM | POA: Diagnosis present

## 2022-08-11 DIAGNOSIS — Z79899 Other long term (current) drug therapy: Secondary | ICD-10-CM

## 2022-08-11 DIAGNOSIS — R29705 NIHSS score 5: Secondary | ICD-10-CM | POA: Diagnosis present

## 2022-08-11 DIAGNOSIS — Z7982 Long term (current) use of aspirin: Secondary | ICD-10-CM

## 2022-08-11 DIAGNOSIS — Z803 Family history of malignant neoplasm of breast: Secondary | ICD-10-CM

## 2022-08-11 DIAGNOSIS — I4891 Unspecified atrial fibrillation: Secondary | ICD-10-CM | POA: Diagnosis present

## 2022-08-11 DIAGNOSIS — I6349 Cerebral infarction due to embolism of other cerebral artery: Principal | ICD-10-CM | POA: Diagnosis present

## 2022-08-11 DIAGNOSIS — E6609 Other obesity due to excess calories: Secondary | ICD-10-CM | POA: Diagnosis present

## 2022-08-11 DIAGNOSIS — E785 Hyperlipidemia, unspecified: Secondary | ICD-10-CM | POA: Diagnosis present

## 2022-08-11 DIAGNOSIS — Z91199 Patient's noncompliance with other medical treatment and regimen due to unspecified reason: Secondary | ICD-10-CM

## 2022-08-11 DIAGNOSIS — Z91030 Bee allergy status: Secondary | ICD-10-CM

## 2022-08-11 DIAGNOSIS — Z7901 Long term (current) use of anticoagulants: Secondary | ICD-10-CM

## 2022-08-11 DIAGNOSIS — G8191 Hemiplegia, unspecified affecting right dominant side: Secondary | ICD-10-CM | POA: Diagnosis present

## 2022-08-11 DIAGNOSIS — R4182 Altered mental status, unspecified: Principal | ICD-10-CM

## 2022-08-11 DIAGNOSIS — Z888 Allergy status to other drugs, medicaments and biological substances status: Secondary | ICD-10-CM

## 2022-08-11 DIAGNOSIS — Z823 Family history of stroke: Secondary | ICD-10-CM

## 2022-08-11 DIAGNOSIS — I10 Essential (primary) hypertension: Secondary | ICD-10-CM | POA: Diagnosis present

## 2022-08-11 DIAGNOSIS — Z79890 Hormone replacement therapy (postmenopausal): Secondary | ICD-10-CM

## 2022-08-11 DIAGNOSIS — M79672 Pain in left foot: Secondary | ICD-10-CM | POA: Diagnosis present

## 2022-08-11 DIAGNOSIS — R471 Dysarthria and anarthria: Secondary | ICD-10-CM | POA: Diagnosis present

## 2022-08-11 DIAGNOSIS — M1990 Unspecified osteoarthritis, unspecified site: Secondary | ICD-10-CM | POA: Diagnosis present

## 2022-08-11 DIAGNOSIS — Z800 Family history of malignant neoplasm of digestive organs: Secondary | ICD-10-CM

## 2022-08-11 DIAGNOSIS — F17200 Nicotine dependence, unspecified, uncomplicated: Secondary | ICD-10-CM | POA: Diagnosis present

## 2022-08-11 DIAGNOSIS — Z8249 Family history of ischemic heart disease and other diseases of the circulatory system: Secondary | ICD-10-CM

## 2022-08-11 DIAGNOSIS — E039 Hypothyroidism, unspecified: Secondary | ICD-10-CM | POA: Diagnosis present

## 2022-08-11 DIAGNOSIS — E1169 Type 2 diabetes mellitus with other specified complication: Secondary | ICD-10-CM | POA: Diagnosis present

## 2022-08-11 DIAGNOSIS — F419 Anxiety disorder, unspecified: Secondary | ICD-10-CM | POA: Diagnosis present

## 2022-08-11 DIAGNOSIS — Z91148 Patient's other noncompliance with medication regimen for other reason: Secondary | ICD-10-CM

## 2022-08-11 DIAGNOSIS — R296 Repeated falls: Secondary | ICD-10-CM | POA: Diagnosis present

## 2022-08-11 DIAGNOSIS — I480 Paroxysmal atrial fibrillation: Secondary | ICD-10-CM | POA: Diagnosis present

## 2022-08-11 DIAGNOSIS — E669 Obesity, unspecified: Secondary | ICD-10-CM

## 2022-08-11 DIAGNOSIS — Z6834 Body mass index (BMI) 34.0-34.9, adult: Secondary | ICD-10-CM

## 2022-08-11 DIAGNOSIS — E1165 Type 2 diabetes mellitus with hyperglycemia: Secondary | ICD-10-CM | POA: Diagnosis present

## 2022-08-11 DIAGNOSIS — Z66 Do not resuscitate: Secondary | ICD-10-CM | POA: Diagnosis present

## 2022-08-11 DIAGNOSIS — Z794 Long term (current) use of insulin: Secondary | ICD-10-CM

## 2022-08-11 DIAGNOSIS — R29702 NIHSS score 2: Secondary | ICD-10-CM | POA: Diagnosis not present

## 2022-08-11 DIAGNOSIS — I639 Cerebral infarction, unspecified: Secondary | ICD-10-CM | POA: Diagnosis present

## 2022-08-11 DIAGNOSIS — F17210 Nicotine dependence, cigarettes, uncomplicated: Secondary | ICD-10-CM | POA: Diagnosis present

## 2022-08-11 DIAGNOSIS — Z7985 Long-term (current) use of injectable non-insulin antidiabetic drugs: Secondary | ICD-10-CM

## 2022-08-11 DIAGNOSIS — S7291XA Unspecified fracture of right femur, initial encounter for closed fracture: Secondary | ICD-10-CM | POA: Diagnosis present

## 2022-08-11 DIAGNOSIS — R9082 White matter disease, unspecified: Secondary | ICD-10-CM | POA: Diagnosis present

## 2022-08-11 DIAGNOSIS — Z7984 Long term (current) use of oral hypoglycemic drugs: Secondary | ICD-10-CM

## 2022-08-11 DIAGNOSIS — G9349 Other encephalopathy: Secondary | ICD-10-CM | POA: Diagnosis present

## 2022-08-11 DIAGNOSIS — I2510 Atherosclerotic heart disease of native coronary artery without angina pectoris: Secondary | ICD-10-CM | POA: Diagnosis present

## 2022-08-11 DIAGNOSIS — Z833 Family history of diabetes mellitus: Secondary | ICD-10-CM

## 2022-08-11 DIAGNOSIS — Z955 Presence of coronary angioplasty implant and graft: Secondary | ICD-10-CM

## 2022-08-11 DIAGNOSIS — W19XXXA Unspecified fall, initial encounter: Secondary | ICD-10-CM | POA: Diagnosis present

## 2022-08-11 DIAGNOSIS — Z8673 Personal history of transient ischemic attack (TIA), and cerebral infarction without residual deficits: Secondary | ICD-10-CM | POA: Diagnosis present

## 2022-08-11 LAB — TROPONIN I (HIGH SENSITIVITY)
Troponin I (High Sensitivity): 9 ng/L (ref <18)
Troponin I (High Sensitivity): 10 ng/L (ref <18)

## 2022-08-11 LAB — CBC WITH DIFFERENTIAL/PLATELET
Abs Immature Granulocytes: 0.03 10*3/uL (ref 0.00–0.07)
Basophils Absolute: 0.1 10*3/uL (ref 0.0–0.1)
Basophils Relative: 1 %
Eosinophils Absolute: 0.1 10*3/uL (ref 0.0–0.5)
Eosinophils Relative: 1 %
HCT: 47.5 % (ref 39.0–52.0)
Hemoglobin: 16.4 g/dL (ref 13.0–17.0)
Immature Granulocytes: 0 %
Lymphocytes Relative: 20 %
Lymphs Abs: 1.7 10*3/uL (ref 0.7–4.0)
MCH: 31.2 pg (ref 26.0–34.0)
MCHC: 34.5 g/dL (ref 30.0–36.0)
MCV: 90.5 fL (ref 80.0–100.0)
Monocytes Absolute: 0.6 10*3/uL (ref 0.1–1.0)
Monocytes Relative: 7 %
Neutro Abs: 5.9 10*3/uL (ref 1.7–7.7)
Neutrophils Relative %: 71 %
Platelets: 232 10*3/uL (ref 150–400)
RBC: 5.25 MIL/uL (ref 4.22–5.81)
RDW: 12.3 % (ref 11.5–15.5)
WBC: 8.3 10*3/uL (ref 4.0–10.5)
nRBC: 0 % (ref 0.0–0.2)

## 2022-08-11 LAB — URINALYSIS, W/ REFLEX TO CULTURE (INFECTION SUSPECTED)
Bacteria, UA: NONE SEEN
Bilirubin Urine: NEGATIVE
Glucose, UA: >=500 mg/dL — AB
Ketones, ur: 20 mg/dL — AB
Leukocytes,Ua: NEGATIVE
Nitrite: NEGATIVE
Protein, ur: >=300 mg/dL — AB
Specific Gravity, Urine: 1.028 (ref 1.005–1.030)
pH: 5 (ref 5.0–8.0)

## 2022-08-11 LAB — COMPREHENSIVE METABOLIC PANEL
ALT: 16 U/L (ref 0–44)
AST: 18 U/L (ref 15–41)
Albumin: 3.8 g/dL (ref 3.5–5.0)
Alkaline Phosphatase: 97 U/L (ref 38–126)
Anion gap: 13 (ref 5–15)
BUN: 5 mg/dL — ABNORMAL LOW (ref 8–23)
CO2: 23 mmol/L (ref 22–32)
Calcium: 9.7 mg/dL (ref 8.9–10.3)
Chloride: 99 mmol/L (ref 98–111)
Creatinine, Ser: 0.87 mg/dL (ref 0.61–1.24)
GFR, Estimated: >60 mL/min (ref >60)
Glucose, Bld: 338 mg/dL — ABNORMAL HIGH (ref 70–99)
Potassium: 3.6 mmol/L (ref 3.5–5.1)
Sodium: 135 mmol/L (ref 135–145)
Total Bilirubin: 1.2 mg/dL (ref 0.3–1.2)
Total Protein: 6.7 g/dL (ref 6.5–8.1)

## 2022-08-11 LAB — BASIC METABOLIC PANEL
Anion gap: 13 (ref 5–15)
BUN: 8 mg/dL (ref 8–23)
CO2: 24 mmol/L (ref 22–32)
Calcium: 9.8 mg/dL (ref 8.9–10.3)
Chloride: 97 mmol/L — ABNORMAL LOW (ref 98–111)
Creatinine, Ser: 0.96 mg/dL (ref 0.61–1.24)
GFR, Estimated: >60 mL/min (ref >60)
Glucose, Bld: 443 mg/dL — ABNORMAL HIGH (ref 70–99)
Potassium: 3.9 mmol/L (ref 3.5–5.1)
Sodium: 134 mmol/L — ABNORMAL LOW (ref 135–145)

## 2022-08-11 LAB — I-STAT VENOUS BLOOD GAS, ED
Acid-base deficit: 2 mmol/L (ref 0.0–2.0)
Bicarbonate: 23.4 mmol/L (ref 20.0–28.0)
Calcium, Ion: 1.22 mmol/L (ref 1.15–1.40)
HCT: 48 % (ref 39.0–52.0)
Hemoglobin: 16.3 g/dL (ref 13.0–17.0)
O2 Saturation: 73 %
Potassium: 3.7 mmol/L (ref 3.5–5.1)
Sodium: 135 mmol/L (ref 135–145)
TCO2: 25 mmol/L (ref 22–32)
pCO2, Ven: 40.4 mmHg — ABNORMAL LOW (ref 44–60)
pH, Ven: 7.371 (ref 7.25–7.43)
pO2, Ven: 40 mmHg (ref 32–45)

## 2022-08-11 LAB — CBG MONITORING, ED
Glucose-Capillary: 306 mg/dL — ABNORMAL HIGH (ref 70–99)
Glucose-Capillary: 447 mg/dL — ABNORMAL HIGH (ref 70–99)
Glucose-Capillary: 308 mg/dL — ABNORMAL HIGH (ref 70–99)

## 2022-08-11 MED ORDER — AMLODIPINE BESYLATE 5 MG PO TABS
5.0000 mg | ORAL_TABLET | Freq: Every day | ORAL | Status: DC
  Administered 2022-08-11 – 2022-08-12 (×2): 5 mg via ORAL
  Filled 2022-08-11 (×2): qty 1

## 2022-08-11 MED ORDER — INSULIN ASPART 100 UNIT/ML IJ SOLN
0.0000 [IU] | Freq: Every day | INTRAMUSCULAR | Status: DC
  Administered 2022-08-11: 4 [IU] via SUBCUTANEOUS

## 2022-08-11 MED ORDER — ASPIRIN 81 MG PO TBEC
81.0000 mg | DELAYED_RELEASE_TABLET | Freq: Every day | ORAL | Status: DC
  Administered 2022-08-11 – 2022-08-13 (×3): 81 mg via ORAL
  Filled 2022-08-11 (×3): qty 1

## 2022-08-11 MED ORDER — ESCITALOPRAM OXALATE 10 MG PO TABS
10.0000 mg | ORAL_TABLET | Freq: Every day | ORAL | Status: DC
  Administered 2022-08-11 – 2022-08-13 (×3): 10 mg via ORAL
  Filled 2022-08-11 (×3): qty 1

## 2022-08-11 MED ORDER — INSULIN LISPRO PROT & LISPRO (75-25 MIX) 100 UNIT/ML ~~LOC~~ SUSP: 40.0000 [IU] | Freq: Every day | SUBCUTANEOUS | Status: DC

## 2022-08-11 MED ORDER — INSULIN ASPART 100 UNIT/ML IJ SOLN
0.0000 [IU] | Freq: Three times a day (TID) | INTRAMUSCULAR | Status: DC
  Administered 2022-08-11: 9 [IU] via SUBCUTANEOUS
  Administered 2022-08-12: 5 [IU] via SUBCUTANEOUS

## 2022-08-11 MED ORDER — INSULIN ASPART 100 UNIT/ML IJ SOLN: 0.0000 [IU] | Freq: Three times a day (TID) | INTRAMUSCULAR | Status: DC

## 2022-08-11 MED ORDER — LEVOTHYROXINE SODIUM 75 MCG PO TABS
175.0000 ug | ORAL_TABLET | Freq: Every day | ORAL | Status: DC
  Administered 2022-08-11 – 2022-08-12 (×2): 175 ug via ORAL
  Filled 2022-08-11 (×2): qty 1

## 2022-08-11 MED ORDER — ROSUVASTATIN CALCIUM 20 MG PO TABS
20.0000 mg | ORAL_TABLET | Freq: Every day | ORAL | Status: DC
  Administered 2022-08-11 – 2022-08-13 (×3): 20 mg via ORAL
  Filled 2022-08-11: qty 1
  Filled 2022-08-11: qty 4
  Filled 2022-08-11: qty 1

## 2022-08-11 MED ORDER — ISOSORBIDE MONONITRATE ER 30 MG PO TB24
30.0000 mg | ORAL_TABLET | Freq: Every day | ORAL | Status: DC
  Administered 2022-08-11 – 2022-08-12 (×2): 30 mg via ORAL
  Filled 2022-08-11 (×2): qty 1

## 2022-08-11 MED ORDER — LOSARTAN POTASSIUM 50 MG PO TABS
100.0000 mg | ORAL_TABLET | Freq: Every day | ORAL | Status: DC
  Administered 2022-08-11 – 2022-08-12 (×2): 100 mg via ORAL
  Filled 2022-08-11 (×2): qty 2

## 2022-08-11 MED ORDER — ACETAMINOPHEN 500 MG PO TABS: 500.0000 mg | ORAL_TABLET | Freq: Four times a day (QID) | ORAL | Status: DC | PRN

## 2022-08-11 MED ORDER — RIVAROXABAN 20 MG PO TABS
20.0000 mg | ORAL_TABLET | Freq: Every day | ORAL | Status: DC
  Administered 2022-08-11 – 2022-08-13 (×3): 20 mg via ORAL
  Filled 2022-08-11 (×3): qty 1
  Filled 2022-08-11: qty 2

## 2022-08-11 NOTE — ED Triage Notes (Addendum)
Pt here via ems from home with increased confusion, multiple falls and hyperglycemia. Seen yesterday for the same and left AMA. Pt with frequent urination, burning with urination, headache. No neuro deficits noted. 20g R wrist. Pt endorses falling multiple times overnight in which he hit his head. Redness noted to forehead. Pt on xarelto. Pt also with slurred speech, states this started yesterday.   BP 150/90 HR 76 RR 93% RA CBG 233

## 2022-08-11 NOTE — Progress Notes (Signed)
Pt is currently awaiting PT Eval.  Prior to dc pt will be set up with [facility] PT and OT.  Pt does not meet the criteria for SNF Placement.  Pt would need a 3 night stay as a inpt to qualify.  Both CSW and CM consulted with pt.  Pt does not need DME.  Pt has a rolling walker at home.  Amaal Dimartino Tarpley-Carter, MSW, LCSW-A Pronouns:  She/Her/Hers [facility] HealthTransitions of Care Clinical Social Worker Direct Number:  [phone] [email or web address]

## 2022-08-11 NOTE — ED Notes (Signed)
Pt is a&ox4, pwd. Pt states that he fell last night after getting home from ER. Pt did hit his head, no LOC, is on thinners. Per daughter, she noticed some slurred speech this afternoon around lunch time. Pt does have an abrasion to right forehead, tenderness noted upon palpation to c-spine. PMS intact. Pt changed into gown, attached to monitor/vitals. Side rails up x 2, call light within reach. Pt designated a level 2 upon MD assessment.

## 2022-08-11 NOTE — Progress Notes (Signed)
Orthopedic Tech Progress Note Patient Details:  Anthony Woods October 07, 1955 [id number]  Level 2 trauma   Patient ID: Anthony Woods, male   DOB: Mar 20, 1956, 67 y.o.   MRN: [id number]  Anthony Woods 08/11/2022, 2:47 PM

## 2022-08-11 NOTE — ED Provider Notes (Signed)
[place] EMERGENCY DEPARTMENT AT [facility] Provider Note   CSN: [id number] Arrival date & time: 08/11/22  1402     History  Chief Complaint  Patient presents with   Fall   Altered Mental Status    Anthony Woods is a 67 y.o. male.  HPI 67 year old male presents with possible confusion/strokelike symptoms.  I did not get a chance to talk to EMS but I did eventually talk to the sister.  Patient was staring off and did not seem "there" when family was there.  He was also having slurred speech that the patient tells me this is because he does not have his dentures in.  He denies any acute weakness currently but has trouble walking and was here yesterday for falls.  He states after he left he fell again multiple times because he was off balance going side-to-side with his walker.  He hit his head multiple times and is on Xarelto.  He has a little bit of a headache and his left distal foot is hurting and his right ribs are hurting.  Home Medications Prior to Admission medications   Medication Sig Start Date End Date Taking? Authorizing Provider  acetaminophen (TYLENOL) 500 MG tablet Take 1 tablet (500 mg total) by mouth every 6 (six) hours as needed. 04/21/18   Wurst, [place], PA-C  amLODipine (NORVASC) 5 MG tablet Take 1 tablet (5 mg total) by mouth daily. 05/04/19   Kathleene Hazel, MD  aspirin EC 81 MG tablet Take 1 tablet (81 mg total) by mouth daily. 10/11/18   Dunn, Tacey Ruiz, PA-C  atenolol-chlorthalidone (TENORETIC) 100-25 MG tablet TAKE 1 TABLET BY MOUTH DAILY. 05/11/21   Plotnikov, Georgina Quint, MD  Cholecalciferol (VITAMIN D3) 50 MCG (2000 UT) capsule Take 1 capsule (2,000 Units total) by mouth daily. 05/26/20   Plotnikov, Georgina Quint, MD  Cyanocobalamin (B-12) 500 MCG TABS TAKE 1 TABLET (500 MCG TOTAL) BY MOUTH DAILY. 02/16/21   Plotnikov, Georgina Quint, MD  diphenhydrAMINE (BENADRYL) 25 mg capsule Take 25 mg by mouth as needed (bee stings).    [provider]   EPINEPHrine (EPIPEN 2-PAK) 0.3 mg/0.3 mL IJ SOAJ injection Inject 0.3 mg into the muscle as needed for anaphylaxis. 12/29/20   Plotnikov, Georgina Quint, MD  escitalopram (LEXAPRO) 10 MG tablet TAKE 1 TABLET (10 MG TOTAL) BY MOUTH DAILY. ANNUAL APPT DUE IN JUNE MUST SEE PROVIDER FOR FUTURE REFILLS 10/16/21   Plotnikov, Georgina Quint, MD  ezetimibe (ZETIA) 10 MG tablet TAKE 1 TABLET BY MOUTH DAILY. 07/17/21   Kathleene Hazel, MD  insulin lispro protamine-lispro (HUMALOG 75/25 MIX) (75-25) 100 UNIT/ML SUSP injection Inject 40 Units into the skin daily.    [provider]  Insulin Pen Needle (ULTICARE MICRO PEN NEEDLES) 32G X 4 MM MISC use with insulin pen to inject insulin 2x daily. 11/19/16   Romero Belling, MD  isosorbide mononitrate (IMDUR) 30 MG 24 hr tablet TAKE 1 TABLET BY MOUTH DAILY. ** DO NOT CRUSH ** 11/09/19   Robbie Lis M, PA-C  levothyroxine (SYNTHROID) 175 MCG tablet TAKE 1 TABLET BY MOUTH DAILY. 11/18/19   Plotnikov, Georgina Quint, MD  losartan (COZAAR) 100 MG tablet TAKE 1 TABLET BY MOUTH DAILY. 12/03/19   Plotnikov, Georgina Quint, MD  metFORMIN (GLUCOPHAGE) 1000 MG tablet Take 1 tablet (1,000 mg total) by mouth 2 (two) times daily with a meal. 08/12/15   Plotnikov, Georgina Quint, MD  nicotine (NICODERM CQ - DOSED IN MG/24 HR) 7 mg/24hr patch  Place 1 patch (7 mg total) onto the skin daily. Once daily for 2 weeks NO SMOKING 06/04/20   Dyann Kief, PA-C  nitroGLYCERIN (NITROSTAT) 0.4 MG SL tablet Place 1 tablet (0.4 mg total) under the tongue every 5 (five) minutes as needed for chest pain. 12/29/20   Plotnikov, Georgina Quint, MD  OZEMPIC, 0.25 OR 0.5 MG/DOSE, 2 MG/1.5ML SOPN Inject 0.5 mg into the skin every Monday.  12/08/17   [provider]  potassium chloride SA (KLOR-CON M) 20 MEQ tablet TAKE 1 TABLET BY MOUTH 2 TIMES DAILY. 07/17/21   Kathleene Hazel, MD  rosuvastatin (CRESTOR) 20 MG tablet TAKE 1 TABLET (20 MG TOTAL) BY MOUTH DAILY. PLEASE MAKE OVERDUE APPT WITH DR.  Clifton James BEFORE ANYMORE REFILLS. THANK YOU 3RD ATTEMPT 08/13/21   Kathleene Hazel, MD  VENTOLIN HFA 108 (90 Base) MCG/ACT inhaler INHALE 1 TO 2 PUFFS INTO THE LUNGS EVERY 6 HOURS AS NEEDED FOR WHEEZING OR SHORTNESS OF BREATH. 01/13/20   Plotnikov, Georgina Quint, MD  XARELTO 20 MG TABS tablet TAKE 1 TABLET BY MOUTH EVERY DAY 01/22/21   Plotnikov, Georgina Quint, MD      Allergies    Bee venom, Actos [pioglitazone hydrochloride], Lipitor [atorvastatin], Other, Pioglitazone, and Lisinopril    Review of Systems   Review of Systems  Cardiovascular:  Positive for chest pain (right ribs).  Gastrointestinal:  Negative for abdominal pain.  Musculoskeletal:  Positive for gait problem and neck pain. Negative for back pain.  Neurological:  Positive for speech difficulty and headaches. Negative for weakness and numbness.  Psychiatric/Behavioral:  Positive for confusion.     Physical Exam Updated Vital Signs BP (!) 159/98   Pulse 86   Temp 98.1 F (36.7 C) (Oral)   Resp 16   Ht 5\' 5"  (1.651 m)   Wt 95 kg   SpO2 99%   BMI 34.85 kg/m  Physical Exam Vitals and nursing note reviewed.  Constitutional:      Appearance: He is well-developed.  HENT:     Head: Normocephalic and atraumatic.  Eyes:     Extraocular Movements: Extraocular movements intact.     Pupils: Pupils are equal, round, and reactive to light.  Cardiovascular:     Rate and Rhythm: Normal rate and regular rhythm.     Pulses:          Dorsalis pedis pulses are 2+ on the right side and 2+ on the left side.     Heart sounds: Normal heart sounds.  Pulmonary:     Effort: Pulmonary effort is normal.     Breath sounds: Normal breath sounds.  Chest:    Abdominal:     Palpations: Abdomen is soft.     Tenderness: There is no abdominal tenderness.  Musculoskeletal:     Cervical back: No rigidity. Spinous process tenderness present.       Feet:  Skin:    General: Skin is warm and dry.  Neurological:     Mental Status: He is  alert and oriented to person, place, and time.     Comments: CN 3-12 grossly intact. 5/5 strength in all 4 extremities. Grossly normal sensation. Normal finger to nose.      ED Results / Procedures / Treatments   Labs (all labs ordered are listed, but only abnormal results are displayed) Labs Reviewed  COMPREHENSIVE METABOLIC PANEL - Abnormal; Notable for the following components:      Result Value   Glucose, Bld 338 (*)  BUN 5 (*)    All other components within normal limits  CBG MONITORING, ED - Abnormal; Notable for the following components:   Glucose-Capillary 306 (*)    All other components within normal limits  I-STAT VENOUS BLOOD GAS, ED - Abnormal; Notable for the following components:   pCO2, Ven 40.4 (*)    All other components within normal limits  CBC WITH DIFFERENTIAL/PLATELET  ETHANOL  RAPID URINE DRUG SCREEN, HOSP PERFORMED  URINALYSIS, W/ REFLEX TO CULTURE (INFECTION SUSPECTED)  TROPONIN I (HIGH SENSITIVITY)    EKG EKG Interpretation  Date/Time:  Wednesday Aug 11 2022 14:24:39 EDT Ventricular Rate:  83 PR Interval:  125 QRS Duration: 105 QT Interval:  374 QTC Calculation: 440 R Axis:   -26 Text Interpretation: Sinus rhythm Borderline left axis deviation Low voltage, precordial leads similar to yesterday Confirmed by Pricilla Loveless [phone]) on 08/11/2022 3:38:54 PM  Radiology DG Ribs Bilateral W/Chest  Result Date: 08/11/2022 CLINICAL DATA:  Fall, left rib pain EXAM: BILATERAL RIBS AND CHEST - 9 VIEW COMPARISON:  Chest radiograph 08/10/2022 FINDINGS: Atherosclerotic calcification of the aortic arch. Thoracic spondylosis noted. Cardiac and mediastinal margins appear normal. No pneumothorax. The lungs appear clear. No blunting of the costophrenic angles. Subtle irregularity of the right right anterior ninth rib on 1 of the oblique projections, cannot exclude subtle fracture. IMPRESSION: 1. Subtle irregularity of the right anterior ninth rib on one of the  oblique projections, cannot exclude a subtle fracture. No other potential rib fractures are detected. 2. Thoracic spondylosis. 3.  Aortic Atherosclerosis (ICD10-I70.0). Electronically Signed   By: Gaylyn Rong M.D.   On: 08/11/2022 15:13   DG Foot Complete Left  Result Date: 08/11/2022 CLINICAL DATA:  Fall, lateral foot pain EXAM: LEFT FOOT - COMPLETE 3+ VIEW COMPARISON:  None Available. FINDINGS: There is mild cortical irregularity in the lateral metaphysis of the proximal phalanx small toe which could be an indicator of a relatively nondisplaced fracture. The finding is subtle and medial extension is poorly defined. Correlate with point tenderness in this vicinity. Bony demineralization and mild cortical indistinctness along portions of the hindfoot including the posterior subtalar joint and anterior calcaneal process. Linear calcifications in the distal Achilles tendon. Mild cortical irregularity along the dorsal talar neck, probably from spurring. IMPRESSION: 1. Subtle cortical irregularity in the lateral metaphysis of the proximal phalanx small toe could be an indicator of a relatively nondisplaced fracture. Correlate with point tenderness in this vicinity. 2. Bony demineralization and mild cortical indistinctness along portions of the hindfoot. If the patient is unable to bear weight or if there is a high clinical suspicion of hindfoot injury, dedicated CT of the foot should be considered. 3. Linear calcifications in the distal Achilles tendon compatible with mild calcific tendinopathy. Electronically Signed   By: Gaylyn Rong M.D.   On: 08/11/2022 15:08   CT Head Wo Contrast  Result Date: 08/11/2022 CLINICAL DATA:  Fall, altered mental status EXAM: CT HEAD WITHOUT CONTRAST CT CERVICAL SPINE WITHOUT CONTRAST TECHNIQUE: Multidetector CT imaging of the head and cervical spine was performed following the standard protocol without intravenous contrast. Multiplanar CT image reconstructions of  the cervical spine were also generated. RADIATION DOSE REDUCTION: This exam was performed according to the departmental dose-optimization program which includes automated exposure control, adjustment of the mA and/or kV according to patient size and/or use of iterative reconstruction technique. COMPARISON:  08/10/2022 FINDINGS: CT HEAD FINDINGS Brain: No evidence of acute infarction, hemorrhage, hydrocephalus, extra-axial collection or mass lesion/mass effect.  Periventricular and deep white matter hypodensity. Vascular: No hyperdense vessel or unexpected calcification. Skull: Normal. Negative for fracture or focal lesion. Sinuses/Orbits: No acute finding. Other: None. CT CERVICAL SPINE FINDINGS Alignment: Degenerative straightening of the normal cervical lordosis. Skull base and vertebrae: No acute fracture. No primary bone lesion or focal pathologic process. Soft tissues and spinal canal: No prevertebral fluid or swelling. No visible canal hematoma. Disc levels: Moderate multilevel disc space height loss and osteophytosis. Upper chest: Negative. Other: None. IMPRESSION: 1. No acute intracranial pathology. Small-vessel white matter disease. 2. No fracture or static subluxation of the cervical spine. 3. Moderate multilevel cervical disc degenerative disease. Electronically Signed   By: Jearld Lesch M.D.   On: 08/11/2022 14:59   CT Cervical Spine Wo Contrast  Result Date: 08/11/2022 CLINICAL DATA:  Fall, altered mental status EXAM: CT HEAD WITHOUT CONTRAST CT CERVICAL SPINE WITHOUT CONTRAST TECHNIQUE: Multidetector CT imaging of the head and cervical spine was performed following the standard protocol without intravenous contrast. Multiplanar CT image reconstructions of the cervical spine were also generated. RADIATION DOSE REDUCTION: This exam was performed according to the departmental dose-optimization program which includes automated exposure control, adjustment of the mA and/or kV according to patient size  and/or use of iterative reconstruction technique. COMPARISON:  08/10/2022 FINDINGS: CT HEAD FINDINGS Brain: No evidence of acute infarction, hemorrhage, hydrocephalus, extra-axial collection or mass lesion/mass effect. Periventricular and deep white matter hypodensity. Vascular: No hyperdense vessel or unexpected calcification. Skull: Normal. Negative for fracture or focal lesion. Sinuses/Orbits: No acute finding. Other: None. CT CERVICAL SPINE FINDINGS Alignment: Degenerative straightening of the normal cervical lordosis. Skull base and vertebrae: No acute fracture. No primary bone lesion or focal pathologic process. Soft tissues and spinal canal: No prevertebral fluid or swelling. No visible canal hematoma. Disc levels: Moderate multilevel disc space height loss and osteophytosis. Upper chest: Negative. Other: None. IMPRESSION: 1. No acute intracranial pathology. Small-vessel white matter disease. 2. No fracture or static subluxation of the cervical spine. 3. Moderate multilevel cervical disc degenerative disease. Electronically Signed   By: Jearld Lesch M.D.   On: 08/11/2022 14:59   CT Knee Right Wo Contrast  Result Date: 08/10/2022 CLINICAL DATA:  Knee trauma, occult fracture suspected. Pain in right knee. EXAM: CT OF THE RIGHT KNEE WITHOUT CONTRAST TECHNIQUE: Multidetector CT imaging of the right knee was performed according to the standard protocol. Multiplanar CT image reconstructions were also generated. RADIATION DOSE REDUCTION: This exam was performed according to the departmental dose-optimization program which includes automated exposure control, adjustment of the mA and/or kV according to patient size and/or use of iterative reconstruction technique. COMPARISON:  Knee radiographs 08/10/2022 FINDINGS: Bones/Joint/Cartilage No acute fracture or dislocation. 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 Small knee joint  effusion. Chondrocalcinosis in the medial and lateral compartments. The deformity seen on same day radiographs about the medial femoral condyle was projectional or due to remote trauma. Ligaments Suboptimally assessed by CT. Muscles and Tendons No acute abnormality. Soft tissues Mild subcutaneous edema about the anterior knee. IMPRESSION: 1. No acute fracture or dislocation. 2. 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 3. Small knee joint effusion. 4. Chondrocalcinosis in the medial and lateral compartments. Electronically Signed   By: Minerva Fester M.D.   On: 08/10/2022 17:21   DG Pelvis 1-2 Views  Result Date: 08/10/2022 CLINICAL DATA:  Status post fall. EXAM: PELVIS - 1-2 VIEW COMPARISON:  None Available. FINDINGS:  There is no evidence of pelvic fracture or diastasis. No pelvic bone lesions are seen. Mild to moderate severity degenerative changes seen involving both hips, in the form of joint space narrowing and acetabular sclerosis. IMPRESSION: Degenerative changes involving both hips. Electronically Signed   By: Aram Candela M.D.   On: 08/10/2022 16:06   DG Knee Complete 4 Views Right  Result Date: 08/10/2022 CLINICAL DATA:  Multiple falls. EXAM: RIGHT KNEE - COMPLETE 4+ VIEW COMPARISON:  June 09, 2005 FINDINGS: A small cortical deformity of indeterminate age is seen involving the medial epicondyle of the distal right femur. There is no evidence of dislocation. Medial and lateral chondrocalcinosis is seen with marked severity medial tibiofemoral joint space narrowing. A small joint effusion is noted. IMPRESSION: Fracture deformity of indeterminate age involving the medial epicondyle of the distal right femur. CT correlation is recommended. Electronically Signed   By: Aram Candela M.D.   On: 08/10/2022 16:05   DG Chest 2 View  Result Date: 08/10/2022 CLINICAL DATA:  Status post multiple falls. EXAM: CHEST - 2 VIEW  COMPARISON:  September 08, 2018 FINDINGS: The heart size and mediastinal contours are within normal limits. There is moderate severity calcification of the aortic arch. Mild atelectasis is seen within the bilateral lung bases. There is no evidence of an acute infiltrate, pleural effusion or pneumothorax. The visualized skeletal structures are unremarkable. IMPRESSION: No acute cardiopulmonary disease. Electronically Signed   By: Aram Candela M.D.   On: 08/10/2022 16:03   CT Head Wo Contrast  Result Date: 08/10/2022 CLINICAL DATA:  Head trauma, minor (Age >= 65y); Neck trauma (Age >= 65y) EXAM: CT HEAD WITHOUT CONTRAST CT CERVICAL SPINE WITHOUT CONTRAST TECHNIQUE: Multidetector CT imaging of the head and cervical spine was performed following the standard protocol without intravenous contrast. Multiplanar CT image reconstructions of the cervical spine were also generated. RADIATION DOSE REDUCTION: This exam was performed according to the departmental dose-optimization program which includes automated exposure control, adjustment of the mA and/or kV according to patient size and/or use of iterative reconstruction technique. COMPARISON:  None Available. FINDINGS: CT HEAD FINDINGS Brain: No evidence of acute cortical infarction, hemorrhage, hydrocephalus, extra-axial collection or mass lesion/mass effect. 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 Vascular: No hyperdense vessel or unexpected calcification. Skull: Normal. Negative for fracture or focal lesion. Sinuses/Orbits: No middle ear or mastoid effusion. Paranasal sinuses are clear. Orbits are unremarkable. Other: None. CT CERVICAL SPINE FINDINGS Alignment: There is straightening of the normal cervical lordosis. Grade 1 anterolisthesis of C3 on C4. Skull base and vertebrae: No acute fracture. No primary bone lesion or focal pathologic process. Soft tissues and spinal canal:  No prevertebral fluid or swelling. No visible canal hematoma. Disc levels:  There is moderate spinal canal stenosis at C5-C6. Upper chest: Negative. Other: Atherosclerotic vascular calcifications. Bilateral tonsilliths. IMPRESSION: 1. No acute intracranial abnormality. 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 2. No acute fracture or traumatic subluxation of the cervical spine. Electronically Signed   By: Lorenza Cambridge M.D.   On: 08/10/2022 15:58   CT Cervical Spine Wo Contrast  Result Date: 08/10/2022 CLINICAL DATA:  Head trauma, minor (Age >= 65y); Neck trauma (Age >= 65y) EXAM: CT HEAD WITHOUT CONTRAST CT CERVICAL SPINE WITHOUT CONTRAST TECHNIQUE: Multidetector CT imaging of the head and cervical spine was performed following the standard protocol without intravenous  contrast. Multiplanar CT image reconstructions of the cervical spine were also generated. RADIATION DOSE REDUCTION: This exam was performed according to the departmental dose-optimization program which includes automated exposure control, adjustment of the mA and/or kV according to patient size and/or use of iterative reconstruction technique. COMPARISON:  None Available. FINDINGS: CT HEAD FINDINGS Brain: No evidence of acute cortical infarction, hemorrhage, hydrocephalus, extra-axial collection or mass lesion/mass effect. 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 Vascular: No hyperdense vessel or unexpected calcification. Skull: Normal. Negative for fracture or focal lesion. Sinuses/Orbits: No middle ear or mastoid effusion. Paranasal sinuses are clear. Orbits are unremarkable. Other: None. CT CERVICAL SPINE FINDINGS Alignment: There is straightening of the normal cervical lordosis. Grade 1 anterolisthesis of C3 on C4. Skull base and vertebrae: No acute fracture. No  primary bone lesion or focal pathologic process. Soft tissues and spinal canal: No prevertebral fluid or swelling. No visible canal hematoma. Disc levels:  There is moderate spinal canal stenosis at C5-C6. Upper chest: Negative. Other: Atherosclerotic vascular calcifications. Bilateral tonsilliths. IMPRESSION: 1. No acute intracranial abnormality. 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 2. No acute fracture or traumatic subluxation of the cervical spine. Electronically Signed   By: Lorenza Cambridge M.D.   On: 08/10/2022 15:58    Procedures Procedures    Medications Ordered in ED Medications - No data to display  ED Course/ Medical Decision Making/ A&P                            Medical Decision Making Amount and/or Complexity of Data Reviewed Labs: ordered.    Details: No acidosis on VBG.  Hyperglycemia. Radiology: ordered and independent interpretation performed.    Details: No head bleed on CT. ECG/medicine tests: ordered and independent interpretation performed.    Details: No ischemia.   Patient was activated as a level 2 trauma due to the fall while on blood thinners though this occurred around midnight last night.  Right now he seems to be better when I talked to the sister but had some confusion earlier in the speech difficulty (might just be his teeth).  However when the nurse tried to get him to sit up he was ataxic according to her and was unable to sit up on his own.  I do not think he will be able to walk at home and will ultimately need admission.  Labs/workup still pending.  Care transferred to Dr. Doran Durand.        Final Clinical Impression(s) / ED Diagnoses Final diagnoses:  None    Rx / DC Orders ED Discharge Orders     None         Pricilla Loveless, MD 08/11/22 1539

## 2022-08-11 NOTE — Care Management (Signed)
TOC team met with patient and spouse to discuss recommendations for transitional care.  Patient and spouse reports patient been having recurrent falls. Spouse is inquiring about skilled nursing placement. Discussed that patient does not meet criteria for SNF, but discussed HH services patient and wife are agreeable. Arranged [facility].  Spouse is requesting PT eval before patient is discharged home. PT consult was placed. Updated EDP and Nursing staff on discharge plan.

## 2022-08-11 NOTE — ED Notes (Signed)
Sandwich and cola provided. Wife at the bedside

## 2022-08-11 NOTE — ED Provider Notes (Signed)
Care of patient received from prior provider at 5:59 PM, please see their note for complete H/P and care plan.  Received handoff per ED course.  Clinical Course as of 08/11/22 1759  Wed Aug 11, 2022  1415 Level 2 trauma [SG]  1514 Stable: Falls, 58 YOM with multiple comorbid medical problems. Family is endorsing speech change but patient states it is because he took out his dentures [CC]    Clinical Course User Index [CC] Glyn Ade, MD [SG] Pricilla Loveless, MD    Reassessment: Reassessed at bedside.  All symptoms have resolved he is back to his mental status baseline per family ember at bedside.  He is too weak to continue going on at home per the wife.  She is refusing to take him home today.  She says that she obliged him yesterday but taking him home with the plan to follow-up with primary care doctor but he is fallen 3 times since then and she can no longer help him get up. Initially his left foot hurt but now that is resolved.  Initially his right chest hurt but now he states that is resolved.  He is alert and oriented x 4.  No acute pathology detected on screening lab work or traumatic evaluation. Given no chest pain or foot pain, abnormalities on those x-rays are probably from prior injuries per the wife at bedside. Will engage social work team for transitions of care planning for safe disposition arrangement.  Patient placed in transitions of care boarding protocol pending their recommendations.  Home medications ordered, diet ordered, vital sign monitoring ordered.     Glyn Ade, MD 08/11/22 [phone]

## 2022-08-11 NOTE — Transitions of Care (Post Inpatient/ED Visit) (Signed)
   08/11/2022  Name: Anthony Woods MRN: [id number] DOB: 1955/12/28  Today's TOC FU Call Status: Today's TOC FU Call Status:: Unsuccessul Call (1st Attempt) Unsuccessful Call (1st Attempt) Date: 08/11/22  Attempted to reach the patient regarding the most recent Inpatient/ED visit.  Follow Up Plan: Additional outreach attempts will be made to reach the patient to complete the Transitions of Care (Post Inpatient/ED visit) call.   Signature Karena Addison, LPN [facility] Nurse Health Advisor Direct Dial [phone]

## 2022-08-11 NOTE — ED Notes (Signed)
Trauma Response Nurse Documentation   Anthony Woods is a 67 y.o. male arriving to [facility] ED via EMS  On Xarelto (rivaroxaban) daily. Trauma was activated as a Level 2 by Dr. Criss Alvine based on the following trauma criteria Elderly patients > 65 with head trauma on anti-coagulation (excluding ASA).  Patient cleared for CT by Dr. Criss Alvine. Pt transported to CT with trauma response nurse present to monitor. RN remained with the patient throughout their absence from the department for clinical observation.   GCS 14.  History   Past Medical History:  Diagnosis Date   Allergy to bee sting    Anxiety    Arthritis    "right knee" (05/12/2016)   CAD (coronary artery disease)    a. DES to LCx 04/2016. b. DES to prox Cx 08/2017. c. Stable cath 09/2018 with mild nonobstructive residual disease, normal EF.   Headache    "when his sugar goes too high or too low" (05/12/2016)   HTN (hypertension)    Hyperlipidemia    Hypothyroidism    Macular degeneration    Obesity    PAF (paroxysmal atrial fibrillation) (HCC)    Type II diabetes mellitus (HCC)      Past Surgical History:  Procedure Laterality Date   CARDIAC CATHETERIZATION     COLONOSCOPY     CORONARY ANGIOPLASTY WITH STENT PLACEMENT  05/12/2016   CORONARY STENT INTERVENTION N/A 05/12/2016   Procedure: Coronary Stent Intervention;  Surgeon: Corky Crafts, MD;  Location: [facility] INVASIVE CV LAB;  Service: Cardiovascular;  Laterality: N/A;   CORONARY STENT INTERVENTION N/A 08/31/2017   Procedure: CORONARY STENT INTERVENTION;  Surgeon: Corky Crafts, MD;  Location: [facility] INVASIVE CV LAB;  Service: Cardiovascular;  Laterality: N/A;   GANGLION CYST EXCISION Left 1980s   KNEE ARTHROSCOPY Right    Meniscus removal   LEFT HEART CATH AND CORONARY ANGIOGRAPHY N/A 05/12/2016   Procedure: Left Heart Cath and Coronary Angiography;  Surgeon: Corky Crafts, MD;  Location: [facility] INVASIVE CV LAB;  Service: Cardiovascular;  Laterality: N/A;   LEFT  HEART CATH AND CORONARY ANGIOGRAPHY N/A 08/31/2017   Procedure: LEFT HEART CATH AND CORONARY ANGIOGRAPHY;  Surgeon: Corky Crafts, MD;  Location: [facility] INVASIVE CV LAB;  Service: Cardiovascular;  Laterality: N/A;   LEFT HEART CATHETERIZATION WITH CORONARY ANGIOGRAM N/A 02/04/2012   Procedure: LEFT HEART CATHETERIZATION WITH CORONARY ANGIOGRAM;  Surgeon: Kathleene Hazel, MD;  Location: [facility] CATH LAB;  Service: Cardiovascular;  Laterality: N/A;   POLYPECTOMY     RIGHT/LEFT HEART CATH AND CORONARY ANGIOGRAPHY N/A 09/28/2018   Procedure: RIGHT/LEFT HEART CATH AND CORONARY ANGIOGRAPHY;  Surgeon: Kathleene Hazel, MD;  Location: [facility] INVASIVE CV LAB;  Service: Cardiovascular;  Laterality: N/A;      Initial Focused Assessment (If applicable, or please see trauma documentation): - Airway intact - lung sounds clear and equal bilaterally - Pt slightly confused - PERRLA - Small abrasion to R forehead - c/o bilateral rib pain and L foot pain  CT's Completed:   CT Head and CT C-Spine   Interventions:  - CXR - bilateral rib XR - CT head and neck - L foot XR - PIVs  - Labs - family at bedside  Plan for disposition:  Other Awaiting scan results  Consults completed:  none at 1515.  Event Summary: Pt comes from home after having multiple falls the past couple of days.  Pt was just here yesterday due to a fall and left AMA.  Pt is  on xarelto and claims to of hit his head today and is confused.    Bedside handoff with ED RN Manning Charity, Durwin Nora  Trauma Response RN  Please call TRN at [phone] for further assistance.

## 2022-08-12 ENCOUNTER — Emergency Department (HOSPITAL_COMMUNITY): Payer: Medicare Other

## 2022-08-12 ENCOUNTER — Inpatient Hospital Stay (HOSPITAL_COMMUNITY): Payer: Medicare Other

## 2022-08-12 ENCOUNTER — Encounter (HOSPITAL_COMMUNITY): Payer: Self-pay | Admitting: Internal Medicine

## 2022-08-12 DIAGNOSIS — F0670 Mild neurocognitive disorder due to known physiological condition without behavioral disturbance: Secondary | ICD-10-CM | POA: Diagnosis not present

## 2022-08-12 DIAGNOSIS — Z794 Long term (current) use of insulin: Secondary | ICD-10-CM | POA: Diagnosis not present

## 2022-08-12 DIAGNOSIS — S7291XA Unspecified fracture of right femur, initial encounter for closed fracture: Secondary | ICD-10-CM | POA: Diagnosis present

## 2022-08-12 DIAGNOSIS — I69398 Other sequelae of cerebral infarction: Secondary | ICD-10-CM | POA: Diagnosis not present

## 2022-08-12 DIAGNOSIS — I6389 Other cerebral infarction: Secondary | ICD-10-CM | POA: Diagnosis not present

## 2022-08-12 DIAGNOSIS — Z8673 Personal history of transient ischemic attack (TIA), and cerebral infarction without residual deficits: Secondary | ICD-10-CM | POA: Diagnosis present

## 2022-08-12 DIAGNOSIS — E6609 Other obesity due to excess calories: Secondary | ICD-10-CM | POA: Diagnosis present

## 2022-08-12 DIAGNOSIS — E1165 Type 2 diabetes mellitus with hyperglycemia: Secondary | ICD-10-CM | POA: Diagnosis present

## 2022-08-12 DIAGNOSIS — Z66 Do not resuscitate: Secondary | ICD-10-CM | POA: Diagnosis present

## 2022-08-12 DIAGNOSIS — I482 Chronic atrial fibrillation, unspecified: Secondary | ICD-10-CM

## 2022-08-12 DIAGNOSIS — Z79899 Other long term (current) drug therapy: Secondary | ICD-10-CM | POA: Diagnosis not present

## 2022-08-12 DIAGNOSIS — R296 Repeated falls: Secondary | ICD-10-CM

## 2022-08-12 DIAGNOSIS — R4182 Altered mental status, unspecified: Secondary | ICD-10-CM

## 2022-08-12 DIAGNOSIS — I2510 Atherosclerotic heart disease of native coronary artery without angina pectoris: Secondary | ICD-10-CM | POA: Diagnosis present

## 2022-08-12 DIAGNOSIS — F17210 Nicotine dependence, cigarettes, uncomplicated: Secondary | ICD-10-CM | POA: Diagnosis present

## 2022-08-12 DIAGNOSIS — W19XXXA Unspecified fall, initial encounter: Secondary | ICD-10-CM | POA: Diagnosis present

## 2022-08-12 DIAGNOSIS — I6349 Cerebral infarction due to embolism of other cerebral artery: Secondary | ICD-10-CM | POA: Diagnosis present

## 2022-08-12 DIAGNOSIS — I10 Essential (primary) hypertension: Secondary | ICD-10-CM | POA: Diagnosis present

## 2022-08-12 DIAGNOSIS — I639 Cerebral infarction, unspecified: Secondary | ICD-10-CM | POA: Diagnosis present

## 2022-08-12 DIAGNOSIS — I480 Paroxysmal atrial fibrillation: Secondary | ICD-10-CM | POA: Diagnosis present

## 2022-08-12 DIAGNOSIS — Z7982 Long term (current) use of aspirin: Secondary | ICD-10-CM | POA: Diagnosis not present

## 2022-08-12 DIAGNOSIS — Z7985 Long-term (current) use of injectable non-insulin antidiabetic drugs: Secondary | ICD-10-CM | POA: Diagnosis not present

## 2022-08-12 DIAGNOSIS — Z800 Family history of malignant neoplasm of digestive organs: Secondary | ICD-10-CM | POA: Diagnosis not present

## 2022-08-12 DIAGNOSIS — Z91148 Patient's other noncompliance with medication regimen for other reason: Secondary | ICD-10-CM

## 2022-08-12 DIAGNOSIS — Z6834 Body mass index (BMI) 34.0-34.9, adult: Secondary | ICD-10-CM | POA: Diagnosis not present

## 2022-08-12 DIAGNOSIS — Z7901 Long term (current) use of anticoagulants: Secondary | ICD-10-CM

## 2022-08-12 DIAGNOSIS — G9349 Other encephalopathy: Secondary | ICD-10-CM | POA: Diagnosis present

## 2022-08-12 DIAGNOSIS — R451 Restlessness and agitation: Secondary | ICD-10-CM | POA: Diagnosis not present

## 2022-08-12 DIAGNOSIS — Z7984 Long term (current) use of oral hypoglycemic drugs: Secondary | ICD-10-CM | POA: Diagnosis not present

## 2022-08-12 DIAGNOSIS — Z91199 Patient's noncompliance with other medical treatment and regimen due to unspecified reason: Secondary | ICD-10-CM

## 2022-08-12 DIAGNOSIS — Z803 Family history of malignant neoplasm of breast: Secondary | ICD-10-CM | POA: Diagnosis not present

## 2022-08-12 DIAGNOSIS — E785 Hyperlipidemia, unspecified: Secondary | ICD-10-CM | POA: Diagnosis present

## 2022-08-12 DIAGNOSIS — E119 Type 2 diabetes mellitus without complications: Secondary | ICD-10-CM | POA: Diagnosis not present

## 2022-08-12 DIAGNOSIS — G8191 Hemiplegia, unspecified affecting right dominant side: Secondary | ICD-10-CM | POA: Diagnosis present

## 2022-08-12 DIAGNOSIS — Z79890 Hormone replacement therapy (postmenopausal): Secondary | ICD-10-CM | POA: Diagnosis not present

## 2022-08-12 DIAGNOSIS — E039 Hypothyroidism, unspecified: Secondary | ICD-10-CM | POA: Diagnosis present

## 2022-08-12 LAB — BASIC METABOLIC PANEL
Anion gap: 15 (ref 5–15)
BUN: 17 mg/dL (ref 8–23)
CO2: 22 mmol/L (ref 22–32)
Calcium: 10.5 mg/dL — ABNORMAL HIGH (ref 8.9–10.3)
Chloride: 98 mmol/L (ref 98–111)
Creatinine, Ser: 1.14 mg/dL (ref 0.61–1.24)
GFR, Estimated: >60 mL/min (ref >60)
Glucose, Bld: 316 mg/dL — ABNORMAL HIGH (ref 70–99)
Potassium: 3.9 mmol/L (ref 3.5–5.1)
Sodium: 135 mmol/L (ref 135–145)

## 2022-08-12 LAB — GLUCOSE, CAPILLARY: Glucose-Capillary: 291 mg/dL — ABNORMAL HIGH (ref 70–99)

## 2022-08-12 LAB — LACTIC ACID, PLASMA: Lactic Acid, Venous: 2.2 mmol/L (ref 0.5–1.9)

## 2022-08-12 LAB — TSH: TSH: 29.667 u[IU]/mL — ABNORMAL HIGH (ref 0.350–4.500)

## 2022-08-12 LAB — HIV ANTIBODY (ROUTINE TESTING W REFLEX): HIV Screen 4th Generation wRfx: NONREACTIVE

## 2022-08-12 LAB — AMMONIA: Ammonia: 25 umol/L (ref 9–35)

## 2022-08-12 LAB — CK: Total CK: 199 U/L (ref 49–397)

## 2022-08-12 MED ORDER — IOHEXOL 350 MG/ML SOLN
75.0000 mL | Freq: Once | INTRAVENOUS | Status: AC | PRN
  Administered 2022-08-12: 75 mL via INTRAVENOUS

## 2022-08-12 MED ORDER — LEVOTHYROXINE SODIUM 75 MCG PO TABS
175.0000 ug | ORAL_TABLET | Freq: Every day | ORAL | Status: DC
  Administered 2022-08-13: 175 ug via ORAL
  Filled 2022-08-12: qty 1

## 2022-08-12 MED ORDER — ACETAMINOPHEN 325 MG PO TABS: 650.0000 mg | ORAL_TABLET | ORAL | Status: DC | PRN

## 2022-08-12 MED ORDER — INSULIN ASPART 100 UNIT/ML IJ SOLN
0.0000 [IU] | Freq: Three times a day (TID) | INTRAMUSCULAR | Status: DC
  Administered 2022-08-12: 8 [IU] via SUBCUTANEOUS

## 2022-08-12 MED ORDER — SENNOSIDES-DOCUSATE SODIUM 8.6-50 MG PO TABS: 1.0000 | ORAL_TABLET | Freq: Every evening | ORAL | Status: DC | PRN

## 2022-08-12 MED ORDER — ACETAMINOPHEN 650 MG RE SUPP: 650.0000 mg | RECTAL | Status: DC | PRN

## 2022-08-12 MED ORDER — INSULIN ASPART 100 UNIT/ML IJ SOLN
0.0000 [IU] | Freq: Three times a day (TID) | INTRAMUSCULAR | Status: DC
  Administered 2022-08-13 (×2): 3 [IU] via SUBCUTANEOUS
  Administered 2022-08-13: 15 [IU] via SUBCUTANEOUS

## 2022-08-12 MED ORDER — NICOTINE 21 MG/24HR TD PT24
21.0000 mg | MEDICATED_PATCH | Freq: Every day | TRANSDERMAL | Status: DC
  Administered 2022-08-12 – 2022-08-13 (×2): 21 mg via TRANSDERMAL
  Filled 2022-08-12 (×2): qty 1

## 2022-08-12 MED ORDER — STROKE: EARLY STAGES OF RECOVERY BOOK
Freq: Once | Status: AC
  Filled 2022-08-12: qty 1

## 2022-08-12 MED ORDER — EZETIMIBE 10 MG PO TABS
10.0000 mg | ORAL_TABLET | Freq: Every day | ORAL | Status: DC
  Administered 2022-08-12 – 2022-08-13 (×2): 10 mg via ORAL
  Filled 2022-08-12 (×2): qty 1

## 2022-08-12 MED ORDER — ACETAMINOPHEN 160 MG/5ML PO SOLN: 650.0000 mg | ORAL | Status: DC | PRN

## 2022-08-12 NOTE — ED Provider Notes (Addendum)
Emergency Medicine Observation Re-evaluation Note  Anthony Woods is a 67 y.o. male, seen on rounds today.  Pt initially presented to the ED for complaints of Fall and Altered Mental Status Currently, the patient is holding for [facility] evaluation.  Awaiting PT and OT.. Wife reports increased confusion this morning, waxing waning mental status for the past several weeks but becoming progressively worse with recurrent falls.  Physical Exam  BP 110/79 (BP Location: Right Arm)   Pulse 88   Temp 97.6 F (36.4 C) (Oral)   Resp 12   Ht 5\' 5"  (1.651 m)   Wt 95 kg   SpO2 95%   BMI 34.85 kg/m  Physical Exam General: No distress, oriented to person and place. Cardiac: Well-perfused Lungs: Clear lungs Psych: Calm  ED Course / MDM  EKG:EKG Interpretation  Date/Time:  Wednesday Aug 11 2022 14:24:39 EDT Ventricular Rate:  83 PR Interval:  125 QRS Duration: 105 QT Interval:  374 QTC Calculation: 440 R Axis:   -26 Text Interpretation: Sinus rhythm Borderline left axis deviation Low voltage, precordial leads similar to yesterday Confirmed by Pricilla Loveless [phone] on 08/11/2022 3:38:54 PM  I have reviewed the labs performed to date as well as medications administered while in observation.  Recent changes in the last 24 hours include negative CT head and C-spine.  Foot x-ray did show some cortical abnormalities of hindfoot and small toe.  Does not appear tender in these areas.  Plan  Current plan is for PT/OT evaluation, TOC for possible placement.  Patient has been seen by social work.  Unfortunately he cannot be placed from the ED due to no recent 3 night stay.  Wife at bedside concerned with worsening confusion, recurrent falls, unable to care for patient at home.  Will attempt medical admission for further evaluation of recurrent falls and altered mental status  Labs show hyperglycemia without DKA.  Urinalysis is negative.  Will add ammonia, TSH, CK and lactate.  Repeat chemistry.  CT foot  and ankle be obtained given abnormalities on x-ray and difficulty bearing weight. Also obtain MRI given report of increasing confusion with recurrent falls.  Discussed with hospitalist Dr. Ophelia Charter who will assess patient for admission.    Glynn Octave, MD 08/12/22 [phone]  Addendum: CT of the left foot shows possible nondisplaced fracture of fifth toe.  Will buddy tape and place cast shoe. 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  2. 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 The largest infarct is located within the left aspect of the pons, measuring 16 x 8 mm. Additionally, there is a 3 mm acute/early subacute infarct within the mid-to-posterior left frontal lobe white matter. Involvement of multiple vascular territories, suspicious for an embolic process.   Glynn Octave, MD 08/12/22 1025

## 2022-08-12 NOTE — Progress Notes (Signed)
  Inpatient Rehabilitation Admissions Coordinator   Met with patient and wife at bedside for rehab assessment. We discussed goals and expectations of a possible CIR admit. They prefer CIR for rehab. Family can provide expected caregiver support that is recommended of supervision to min assist level. Patient retired January and wife does not work outside of the home. I await medical workup completion before pursuing CIR admit. Please call me with any questions.   Ottie Glazier, RN, MSN Rehab Admissions Coordinator [phone]

## 2022-08-12 NOTE — Significant Event (Signed)
Noted [facility] swallow screen not charted; RN performed after patient settled in the room. Patient cough after taking a drink. Study stopped; educated patient not to have further oral intake until seen by Speech; order placed for speech evaluation.

## 2022-08-12 NOTE — H&P (Signed)
History and Physical    Patient: Anthony Woods ZOX:[id number] DOB: 08-28-1955 DOA: 08/11/2022 DOS: the patient was seen and examined on 08/12/2022 PCP: Plotnikov, Georgina Quint, MD  Patient coming from: Home - lives with wife in a trailer; NOK: Wife, [phone]   Chief Complaint: Falls  HPI: Anthony Woods is a 67 y.o. male with medical history significant of CAD s/p stent; HTN; HLD; afib on Xarelto; DM; and hypothyroidism who presented on 5/21 with a fall, complaining of dizziness.  Imaging showed a possible medial epicondyle fracture but CT was negative as well as head and neck CTs.  His wife requested to take him home.  He returned on 5/22 with increased confusion and falls as well as urinary symptoms.  Symptoms resolved but his wife noted that he was too weak to go home and so they boarded him overnight.  He does not have a qualifying inpatient diagnosis and so was recommended to return home with [facility] and is awaiting PT evaluation.  His wife reports that he has been falling.  His speech has been slurred, doesn't always remember things over the last 3 weeks.  It has been progressing for about 3 weeks.  He reported the other day that he couldn't feel either of his feet.  He thinks his R arm and leg are periodically weaker.  No dysphagia.    ER Course:  3rd ED visit in 3 days with recurrent falls.  Came in yesterday with "head injury", imaging negative.  Plan was to hold for placement.  Wife reports waxing/waning confusion for weeks, worsening.  Unremarkable evaluation.  CT foot and MRI brain pending.     Review of Systems: As mentioned in the history of present illness. All other systems reviewed and are negative. Past Medical History:  Diagnosis Date   Allergy to bee sting    Anxiety    Arthritis    "right knee" (05/12/2016)   CAD (coronary artery disease)    a. DES to LCx 04/2016. b. DES to prox Cx 08/2017. c. Stable cath 09/2018 with mild nonobstructive residual disease, normal EF.    Headache    "when his sugar goes too high or too low" (05/12/2016)   HTN (hypertension)    Hyperlipidemia    Hypothyroidism    Macular degeneration    Obesity    PAF (paroxysmal atrial fibrillation) (HCC)    Type II diabetes mellitus (HCC)    Past Surgical History:  Procedure Laterality Date   CARDIAC CATHETERIZATION     COLONOSCOPY     CORONARY ANGIOPLASTY WITH STENT PLACEMENT  05/12/2016   CORONARY STENT INTERVENTION N/A 05/12/2016   Procedure: Coronary Stent Intervention;  Surgeon: Anthony Crafts, MD;  Location: [facility] INVASIVE CV LAB;  Service: Cardiovascular;  Laterality: N/A;   CORONARY STENT INTERVENTION N/A 08/31/2017   Procedure: CORONARY STENT INTERVENTION;  Surgeon: Anthony Crafts, MD;  Location: [facility] INVASIVE CV LAB;  Service: Cardiovascular;  Laterality: N/A;   GANGLION CYST EXCISION Left 1980s   KNEE ARTHROSCOPY Right    Meniscus removal   LEFT HEART CATH AND CORONARY ANGIOGRAPHY N/A 05/12/2016   Procedure: Left Heart Cath and Coronary Angiography;  Surgeon: Anthony Crafts, MD;  Location: [facility] INVASIVE CV LAB;  Service: Cardiovascular;  Laterality: N/A;   LEFT HEART CATH AND CORONARY ANGIOGRAPHY N/A 08/31/2017   Procedure: LEFT HEART CATH AND CORONARY ANGIOGRAPHY;  Surgeon: Anthony Crafts, MD;  Location: [facility] INVASIVE CV LAB;  Service: Cardiovascular;  Laterality: N/A;   LEFT  HEART CATHETERIZATION WITH CORONARY ANGIOGRAM N/A 02/04/2012   Procedure: LEFT HEART CATHETERIZATION WITH CORONARY ANGIOGRAM;  Surgeon: Anthony Hazel, MD;  Location: [facility] CATH LAB;  Service: Cardiovascular;  Laterality: N/A;   POLYPECTOMY     RIGHT/LEFT HEART CATH AND CORONARY ANGIOGRAPHY N/A 09/28/2018   Procedure: RIGHT/LEFT HEART CATH AND CORONARY ANGIOGRAPHY;  Surgeon: Anthony Hazel, MD;  Location: [facility] INVASIVE CV LAB;  Service: Cardiovascular;  Laterality: N/A;   Social History:  reports that he has been smoking cigarettes. He has a 126.00 pack-year smoking history. He has  never used smokeless tobacco. He reports that he does not drink alcohol and does not use drugs.  Allergies  Allergen Reactions   Bee Venom Anaphylaxis    Other reaction(s): Unknown   Actos [Pioglitazone Hydrochloride] Swelling   Lipitor [Atorvastatin] Other (See Comments)    Patient cannot recall reaction   Lisinopril Other (See Comments)    cough    Family History  Problem Relation Age of Onset   Diabetes Mother    Cancer Mother        male ca   Colon cancer Mother 41   Diabetes Father    Cancer Father        stomach   CVA Father    Stomach cancer Father    Breast cancer Sister    Diabetes Sister    CAD Maternal Grandmother    CVA Paternal Uncle    Esophageal cancer Neg Hx    Rectal cancer Neg Hx     Prior to Admission medications   Medication Sig Start Date End Date Taking? Authorizing Provider  acetaminophen (TYLENOL) 500 MG tablet Take 1 tablet (500 mg total) by mouth every 6 (six) hours as needed. 04/21/18  Yes Wurst, [place], PA-C  atenolol-chlorthalidone (TENORETIC) 100-25 MG tablet TAKE 1 TABLET BY MOUTH DAILY. 05/11/21  Yes Plotnikov, Georgina Quint, MD  Cyanocobalamin (B-12) 500 MCG TABS TAKE 1 TABLET (500 MCG TOTAL) BY MOUTH DAILY. 02/16/21  Yes Plotnikov, Georgina Quint, MD  diphenhydrAMINE (BENADRYL) 25 mg capsule Take 25 mg by mouth as needed (bee stings).   Yes [provider]  EPINEPHrine (EPIPEN 2-PAK) 0.3 mg/0.3 mL IJ SOAJ injection Inject 0.3 mg into the muscle as needed for anaphylaxis. 12/29/20  Yes Plotnikov, Georgina Quint, MD  escitalopram (LEXAPRO) 10 MG tablet TAKE 1 TABLET (10 MG TOTAL) BY MOUTH DAILY. ANNUAL APPT DUE IN JUNE MUST SEE PROVIDER FOR FUTURE REFILLS 10/16/21  Yes Plotnikov, Georgina Quint, MD  ezetimibe (ZETIA) 10 MG tablet TAKE 1 TABLET BY MOUTH DAILY. Patient taking differently: Take 10 mg by mouth daily. 07/17/21  Yes Anthony Hazel, MD  isosorbide mononitrate (IMDUR) 30 MG 24 hr tablet TAKE 1 TABLET BY MOUTH DAILY. ** DO NOT CRUSH  ** Patient taking differently: Take 30 mg by mouth daily. 11/09/19  Yes Anthony Woods M, PA-C  levothyroxine (SYNTHROID) 200 MCG tablet Take 200 mcg by mouth every morning. 03/11/22  Yes [provider]  metFORMIN (GLUCOPHAGE) 1000 MG tablet Take 1 tablet (1,000 mg total) by mouth 2 (two) times daily with a meal. 08/12/15  Yes Plotnikov, Georgina Quint, MD  nitroGLYCERIN (NITROSTAT) 0.4 MG SL tablet Place 1 tablet (0.4 mg total) under the tongue every 5 (five) minutes as needed for chest pain. 12/29/20  Yes Plotnikov, Georgina Quint, MD  OZEMPIC, 0.25 OR 0.5 MG/DOSE, 2 MG/1.5ML SOPN Inject 0.5 mg into the skin once a week. 12/08/17  Yes [provider]  potassium chloride SA (KLOR-CON M)  20 MEQ tablet TAKE 1 TABLET BY MOUTH 2 TIMES DAILY. Patient taking differently: Take 20 mEq by mouth 2 (two) times daily. 07/17/21  Yes Anthony Hazel, MD  rosuvastatin (CRESTOR) 20 MG tablet TAKE 1 TABLET (20 MG TOTAL) BY MOUTH DAILY. PLEASE MAKE OVERDUE APPT WITH DR. Clifton James BEFORE ANYMORE REFILLS. THANK YOU 3RD ATTEMPT Patient taking differently: Take 20 mg by mouth daily. 08/13/21  Yes Anthony Hazel, MD  XARELTO 20 MG TABS tablet TAKE 1 TABLET BY MOUTH EVERY DAY 01/22/21  Yes Plotnikov, Georgina Quint, MD  amLODipine (NORVASC) 5 MG tablet Take 1 tablet (5 mg total) by mouth daily. Patient not taking: Reported on 08/11/2022 05/04/19   Anthony Hazel, MD  aspirin EC 81 MG tablet Take 1 tablet (81 mg total) by mouth daily. Patient not taking: Reported on 08/11/2022 10/11/18   Laurann Montana, PA-C  Cholecalciferol (VITAMIN D3) 50 MCG (2000 UT) capsule Take 1 capsule (2,000 Units total) by mouth daily. Patient not taking: Reported on 08/11/2022 05/26/20   Plotnikov, Georgina Quint, MD  insulin lispro protamine-lispro (HUMALOG 75/25 MIX) (75-25) 100 UNIT/ML SUSP injection Inject 40 Units into the skin daily. Patient not taking: Reported on 08/11/2022    [provider]  Insulin Pen Needle  (ULTICARE MICRO PEN NEEDLES) 32G X 4 MM MISC use with insulin pen to inject insulin 2x daily. 11/19/16   Romero Belling, MD  losartan (COZAAR) 100 MG tablet TAKE 1 TABLET BY MOUTH DAILY. Patient not taking: Reported on 08/11/2022 12/03/19   Plotnikov, Georgina Quint, MD  nicotine (NICODERM CQ - DOSED IN MG/24 HR) 7 mg/24hr patch Place 1 patch (7 mg total) onto the skin daily. Once daily for 2 weeks NO SMOKING Patient not taking: Reported on 08/11/2022 06/04/20   Dyann Kief, PA-C    Physical Exam: Vitals:   08/12/22 0203 08/12/22 0622 08/12/22 1253 08/12/22 1521  BP:  110/79 (!) 91/55 100/61  Pulse: 67 88 89 71  Resp:  12 12 18   Temp:  97.6 F (36.4 C) 98.4 F (36.9 C) 98.2 F (36.8 C)  TempSrc:  Oral Oral Oral  SpO2: 96% 95% 96% 96%  Weight:      Height:       General:  Appears older than stated age, chronically ill, slurred speech, RUE neglect Eyes:  PERRL, EOMI, normal lids, iris ENT:  grossly normal hearing, lips & tongue, mmm; poor/absent dentition Neck:  no LAD, masses or thyromegaly Cardiovascular:  RRR, no m/r/g. No LE edema.  Respiratory:   CTA bilaterally with no wheezes/rales/rhonchi.  Normal respiratory effort. Abdomen:  soft, NT, ND Skin:  no rash or induration seen on limited exam Musculoskeletal:  subtle decrease in strength of RUE > RLE  Psychiatric:  blunted mood and affect, speech dysarthric, A&Ox3 Neurologic:  CN 2-12 grossly intact with subtle R facial droop, moves all extremities in coordinated fashion with mild RUE neglect and subtle R-sided weakness   Radiological Exams on Admission: Independently reviewed - see discussion in A/P where applicable  CT ANGIO HEAD NECK W WO CM  Result Date: 08/12/2022 CLINICAL DATA:  Stroke/TIA, determine embolic source EXAM: CT ANGIOGRAPHY HEAD AND NECK WITH AND WITHOUT CONTRAST TECHNIQUE: Multidetector CT imaging of the head and neck was performed using the standard protocol during bolus administration of intravenous contrast.  Multiplanar CT image reconstructions and MIPs were obtained to evaluate the vascular anatomy. Carotid stenosis measurements (when applicable) are obtained utilizing NASCET criteria, using the distal internal carotid diameter as  the denominator. RADIATION DOSE REDUCTION: This exam was performed according to the departmental dose-optimization program which includes automated exposure control, adjustment of the mA and/or kV according to patient size and/or use of iterative reconstruction technique. CONTRAST:  75mL OMNIPAQUE IOHEXOL 350 MG/ML SOLN COMPARISON:  Same day MRI head. FINDINGS: CT HEAD FINDINGS Brain: No acute infarcts better characterized on same day MRI head. No progressive mass effect or acute hemorrhage. Chronic microvascular ischemic disease. No midline shift, mass lesion or hydrocephalus. Vascular: See below. Skull: No acute fracture. Sinuses/Orbits: Clear sinuses.  No acute orbital findings. Other: No mastoid effusions. Review of the MIP images confirms the above findings CTA NECK FINDINGS Aortic arch: Great vessel origins are patent without significant stenosis. Calcific atherosclerosis. Right carotid system: Atherosclerosis at the carotid bifurcation with approximately 40% stenosis. Left carotid system: Atherosclerosis of the carotid bifurcation without greater than 50% stenosis Vertebral arteries: Focal occlusion versus severe/critical stenosis of the left vertebral artery origin with more distal opacification. Moderate right vertebral artery origin stenosis. Remainder of the vertebral arteries are patent bilaterally. Skeleton: No acute abnormality limited assessment. Other neck: No acute abnormality on limited assessment. Upper chest: Visualized lung apices are clear. Review of the MIP images confirms the above findings CTA HEAD FINDINGS Anterior circulation: Bilateral intracranial ICAs, MCAs and ACAs are patent without proximal hemodynamically significant stenosis. Posterior circulation: Bilateral  intradural vertebral arteries, basilar artery and bilateral posterior cerebral arteries are patent without proximally normally in stenosis. Venous sinuses: As permitted by contrast timing, patent. Review of the MIP images confirms the above findings IMPRESSION: 1. Focal occlusion versus severe/critical stenosis of the left vertebral artery origin with more distal opacification. 2. Moderate right vertebral artery origin stenosis. 3. Approximately 40% stenosis of the right ICA origin in the neck. 4.  Aortic Atherosclerosis (ICD10-I70.0). Electronically Signed   By: Feliberto Harts M.D.   On: 08/12/2022 14:40   MR BRAIN WO CONTRAST  Result Date: 08/12/2022 CLINICAL DATA:  Provided history: Neuro deficit, acute, stroke suspected. Fall. Altered mental status. EXAM: MRI HEAD WITHOUT CONTRAST TECHNIQUE: Multiplanar, multiecho pulse sequences of the brain and surrounding structures were obtained without intravenous contrast. COMPARISON:  Head CT 08/11/2022. FINDINGS: Intermittently motion degraded examination. Most notably, the sagittal T1-weighted sequence is severely motion degraded. Within this limitation, findings are as follows. Brain: Mild-to-moderate generalized cerebral atrophy. 16 x 8 mm acute infarct within the left aspect of the pons (series 2, image 14). 6 mm acute infarct within the right thalamus (series 2, image 24). 7 mm acute infarct within the left thalamus (series 2, image 23). 3 mm acute cortical infarct within the inferomedial left parietal lobe (series 3, image 13). 6 mm acute infarct within the right parietal white matter (series 2, image 30) (series 3, image 11). 3 mm acute/early subacute infarct within the mid-to-posterior left frontal lobe white matter (series 2, image 33) (series 3, image 21). Chronic lacunar infarcts within the bilateral corona radiata and thalami. Background moderate multifocal T2 FLAIR hyperintense signal abnormality within the cerebral white matter, nonspecific but  compatible with chronic small vessel ischemic disease. No evidence of an intracranial mass. No extra-axial fluid collection. No midline shift. Vascular: Maintained flow voids within the proximal large arterial vessels. Skull and upper cervical spine: No focal suspicious marrow lesion. Sinuses/Orbits: No mass or acute finding within the imaged orbits. No significant paranasal sinus disease. IMPRESSION: 1. Motion degraded examination. 2. 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 The largest infarct  is located within the left aspect of the pons, measuring 16 x 8 mm. Additionally, there is a 3 mm acute/early subacute infarct within the mid-to-posterior left frontal lobe white matter. Involvement of multiple vascular territories, suspicious for an embolic process. 3. Background parenchymal atrophy, chronic small vessel ischemic disease and chronic lacunar infarcts as described. Electronically Signed   By: Jackey Loge D.O.   On: 08/12/2022 10:20   CT Foot Left Wo Contrast  Result Date: 08/12/2022 CLINICAL DATA:  Foot trauma, occult fracture suspected. EXAM: CT OF THE LEFT FOOT WITHOUT CONTRAST TECHNIQUE: Multidetector CT imaging of the left foot was performed according to the standard protocol. Multiplanar CT image reconstructions were also generated. RADIATION DOSE REDUCTION: This exam was performed according to the departmental dose-optimization program which includes automated exposure control, adjustment of the mA and/or kV according to patient size and/or use of iterative reconstruction technique. COMPARISON:  Radiographs dated Aug 11, 2022 FINDINGS: Bones/Joint/Cartilage There is minimal cortical irregularity about the lateral aspect of the proximal phalanx of the fifth digit which could be due to osseous remodeling or a nondisplaced fracture. There is generalized osteopenia. Mild arthritic changes of the tibiotalar and subtalar joints with marginal  spurring. No appreciable fracture of the calcaneus. Ligaments Suboptimally assessed by CT. Muscles and Tendons Muscles are normal in bulk. Tendons of the flexor and extensor compartments appear intact. Mild Achilles enthesopathy. Soft tissues Skin and subcutaneous soft tissues are within normal limits. No fluid collection or hematoma. IMPRESSION: 1. Minimal cortical irregularity about the lateral aspect of the proximal phalanx of the fifth digit which could be due to osseous remodeling or a nondisplaced fracture. 2. Generalized osteopenia. 3. Mild arthritic changes of the tibiotalar and subtalar joints. Electronically Signed   By: Larose Hires D.O.   On: 08/12/2022 09:56   DG Ribs Bilateral W/Chest  Result Date: 08/11/2022 CLINICAL DATA:  Fall, left rib pain EXAM: BILATERAL RIBS AND CHEST - 9 VIEW COMPARISON:  Chest radiograph 08/10/2022 FINDINGS: Atherosclerotic calcification of the aortic arch. Thoracic spondylosis noted. Cardiac and mediastinal margins appear normal. No pneumothorax. The lungs appear clear. No blunting of the costophrenic angles. Subtle irregularity of the right right anterior ninth rib on 1 of the oblique projections, cannot exclude subtle fracture. IMPRESSION: 1. Subtle irregularity of the right anterior ninth rib on one of the oblique projections, cannot exclude a subtle fracture. No other potential rib fractures are detected. 2. Thoracic spondylosis. 3.  Aortic Atherosclerosis (ICD10-I70.0). Electronically Signed   By: Gaylyn Rong M.D.   On: 08/11/2022 15:13   DG Foot Complete Left  Result Date: 08/11/2022 CLINICAL DATA:  Fall, lateral foot pain EXAM: LEFT FOOT - COMPLETE 3+ VIEW COMPARISON:  None Available. FINDINGS: There is mild cortical irregularity in the lateral metaphysis of the proximal phalanx small toe which could be an indicator of a relatively nondisplaced fracture. The finding is subtle and medial extension is poorly defined. Correlate with point tenderness in this  vicinity. Bony demineralization and mild cortical indistinctness along portions of the hindfoot including the posterior subtalar joint and anterior calcaneal process. Linear calcifications in the distal Achilles tendon. Mild cortical irregularity along the dorsal talar neck, probably from spurring. IMPRESSION: 1. Subtle cortical irregularity in the lateral metaphysis of the proximal phalanx small toe could be an indicator of a relatively nondisplaced fracture. Correlate with point tenderness in this vicinity. 2. Bony demineralization and mild cortical indistinctness along portions of the hindfoot. If the patient is unable to bear weight or if there  is a high clinical suspicion of hindfoot injury, dedicated CT of the foot should be considered. 3. Linear calcifications in the distal Achilles tendon compatible with mild calcific tendinopathy. Electronically Signed   By: Gaylyn Rong M.D.   On: 08/11/2022 15:08   CT Head Wo Contrast  Result Date: 08/11/2022 CLINICAL DATA:  Fall, altered mental status EXAM: CT HEAD WITHOUT CONTRAST CT CERVICAL SPINE WITHOUT CONTRAST TECHNIQUE: Multidetector CT imaging of the head and cervical spine was performed following the standard protocol without intravenous contrast. Multiplanar CT image reconstructions of the cervical spine were also generated. RADIATION DOSE REDUCTION: This exam was performed according to the departmental dose-optimization program which includes automated exposure control, adjustment of the mA and/or kV according to patient size and/or use of iterative reconstruction technique. COMPARISON:  08/10/2022 FINDINGS: CT HEAD FINDINGS Brain: No evidence of acute infarction, hemorrhage, hydrocephalus, extra-axial collection or mass lesion/mass effect. Periventricular and deep white matter hypodensity. Vascular: No hyperdense vessel or unexpected calcification. Skull: Normal. Negative for fracture or focal lesion. Sinuses/Orbits: No acute finding. Other: None.  CT CERVICAL SPINE FINDINGS Alignment: Degenerative straightening of the normal cervical lordosis. Skull base and vertebrae: No acute fracture. No primary bone lesion or focal pathologic process. Soft tissues and spinal canal: No prevertebral fluid or swelling. No visible canal hematoma. Disc levels: Moderate multilevel disc space height loss and osteophytosis. Upper chest: Negative. Other: None. IMPRESSION: 1. No acute intracranial pathology. Small-vessel white matter disease. 2. No fracture or static subluxation of the cervical spine. 3. Moderate multilevel cervical disc degenerative disease. Electronically Signed   By: Jearld Lesch M.D.   On: 08/11/2022 14:59   CT Cervical Spine Wo Contrast  Result Date: 08/11/2022 CLINICAL DATA:  Fall, altered mental status EXAM: CT HEAD WITHOUT CONTRAST CT CERVICAL SPINE WITHOUT CONTRAST TECHNIQUE: Multidetector CT imaging of the head and cervical spine was performed following the standard protocol without intravenous contrast. Multiplanar CT image reconstructions of the cervical spine were also generated. RADIATION DOSE REDUCTION: This exam was performed according to the departmental dose-optimization program which includes automated exposure control, adjustment of the mA and/or kV according to patient size and/or use of iterative reconstruction technique. COMPARISON:  08/10/2022 FINDINGS: CT HEAD FINDINGS Brain: No evidence of acute infarction, hemorrhage, hydrocephalus, extra-axial collection or mass lesion/mass effect. Periventricular and deep white matter hypodensity. Vascular: No hyperdense vessel or unexpected calcification. Skull: Normal. Negative for fracture or focal lesion. Sinuses/Orbits: No acute finding. Other: None. CT CERVICAL SPINE FINDINGS Alignment: Degenerative straightening of the normal cervical lordosis. Skull base and vertebrae: No acute fracture. No primary bone lesion or focal pathologic process. Soft tissues and spinal canal: No prevertebral fluid  or swelling. No visible canal hematoma. Disc levels: Moderate multilevel disc space height loss and osteophytosis. Upper chest: Negative. Other: None. IMPRESSION: 1. No acute intracranial pathology. Small-vessel white matter disease. 2. No fracture or static subluxation of the cervical spine. 3. Moderate multilevel cervical disc degenerative disease. Electronically Signed   By: Jearld Lesch M.D.   On: 08/11/2022 14:59   CT Knee Right Wo Contrast  Result Date: 08/10/2022 CLINICAL DATA:  Knee trauma, occult fracture suspected. Pain in right knee. EXAM: CT OF THE RIGHT KNEE WITHOUT CONTRAST TECHNIQUE: Multidetector CT imaging of the right knee was performed according to the standard protocol. Multiplanar CT image reconstructions were also generated. RADIATION DOSE REDUCTION: This exam was performed according to the departmental dose-optimization program which includes automated exposure control, adjustment of the mA and/or kV according to patient size and/or  use of iterative reconstruction technique. COMPARISON:  Knee radiographs 08/10/2022 FINDINGS: Bones/Joint/Cartilage No acute fracture or dislocation. 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 Small knee joint effusion. Chondrocalcinosis in the medial and lateral compartments. The deformity seen on same day radiographs about the medial femoral condyle was projectional or due to remote trauma. Ligaments Suboptimally assessed by CT. Muscles and Tendons No acute abnormality. Soft tissues Mild subcutaneous edema about the anterior knee. IMPRESSION: 1. No acute fracture or dislocation. 2. 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 3. Small knee joint effusion. 4. Chondrocalcinosis in the medial and lateral compartments. Electronically Signed   By: Minerva Fester M.D.   On: 08/10/2022 17:21    EKG: Independently  reviewed.  NSR with rate 83; low voltage; nonspecific ST changes with no evidence of acute ischemia   Labs on Admission: I have personally reviewed the available labs and imaging studies at the time of the admission.  Pertinent labs:    Glucose 443, 308 CO2 24 UA: >500 glucose, small Hgb, 20 ketones, >300 protein Lactate 2.2 TSH 29.667 HIV negative   Assessment and Plan: Principal Problem:   Acute CVA (cerebrovascular accident) (HCC) Active Problems:   Hypothyroidism   Diabetes mellitus type 2 in obese   Essential hypertension   Class 1 obesity due to excess calories with body mass index (BMI) of 34.0 to 34.9 in adult   Atrial fibrillation (HCC)   Tobacco dependence   Dyslipidemia   Noncompliance with medication regimen   DNR (do not resuscitate)    Acute CVA -Series of embolic-appearing CVAs which are likely related to medication non-compliance in the setting of known afib -Patient presented a few days ago with recurrent falls, was discharged -He returned today with the same along with slurred speech, concerning for CVA -MRI was performed and showed acute infarcts of the L pons, B thalami, and B parietal lobes and another in the L frontal lobe -Aspirin has been given to reduce stroke mortality and decrease morbidity -Will admit for further CVA evaluation -Telemetry monitoring -CTA head/neck ordered -Echo ordered -Risk stratification with FLP, A1c; will also check TSH and UDS -Patient will need DAPT for 21 days when ABCD2 score is at least 4 and NIH score is 3 or less, and then can transition to monotherapy with a single antiplatelet agent.  Will defer to neurology for now since he also needs AC. -Consider thrombectomy if there is persistent disabling neurologic deficit associated with a vascular cut-off -Neurology consult -PT/OT/ST/Nutrition Consults -SW consult for placement - he has Medicare A/B and so should be able to go to inpatient rehab without insurance  pre-approval if appropriate  Afib -Rate controlled with atenolol with ?compliance -Not taking Xarelto at home, by report -Resume Xarelto now  HTN -Allow permissive HTN for now -Treat BP only if >220/120, and then with goal of 15% reduction -Hold atenolol, HCTZ, amlodipine, losartan and plan to restart in 48-72 hours -Will also hold Imdur for now   HLD -Check FLP -Continue Crestor, Zetia   DM -Check A1c -Hold home PO medication (metformin) -It appears that he has not been taking insulin, likely to need to restart -Will order moderate-scale SSI   Hypothyroidism -TSH is 30 - likely due to non-compliance -Will restart Synthroid  Obesity -Body mass index is 34.85 kg/m..  -Weight loss should be encouraged on an ongoing basis -Has been on Ozempic, uncertain about compliance -Outpatient PCP/bariatric medicine f/u  encouraged   Tobacco dependence -His wife reports that he smokes up to 3 ppd -He was previously prescribed the smallest dose of nicotine patch but likely needs the biggest one instead, ordered  DNR -I have discussed code status with the patient and his wife and they are in agreement that the patient would not desire resuscitation and would prefer to die a natural death should that situation arise.     Advance Care Planning:   Code Status: DNR   Consults: Neurology; PT/OT/ST; TOC team; nutritrion; DM coordinator  DVT Prophylaxis: Xarelto  Family Communication: Wife was present throughout evaluation  Severity of Illness: The appropriate patient status for this patient is INPATIENT. Inpatient status is judged to be reasonable and necessary in order to provide the required intensity of service to ensure the patient's safety. The patient's presenting symptoms, physical exam findings, and initial radiographic and laboratory data in the context of their chronic comorbidities is felt to place them at high risk for further clinical deterioration. Furthermore, it is not  anticipated that the patient will be medically stable for discharge from the hospital within 2 midnights of admission.   * I certify that at the point of admission it is my clinical judgment that the patient will require inpatient hospital care spanning beyond 2 midnights from the point of admission due to high intensity of service, high risk for further deterioration and high frequency of surveillance required.*  Author: Jonah Blue, MD 08/12/2022 4:58 PM  For on call review [email or web address].

## 2022-08-12 NOTE — Transitions of Care (Post Inpatient/ED Visit) (Signed)
   08/12/2022  Name: Anthony Woods MRN: [id number] DOB: 1955/03/27  Today's TOC FU Call Status: Today's TOC FU Call Status:: Unsuccessful Call (2nd Attempt) Unsuccessful Call (1st Attempt) Date: 08/11/22 Unsuccessful Call (2nd Attempt) Date: 08/12/22  Attempted to reach the patient regarding the most recent Inpatient/ED visit.  Follow Up Plan: Additional outreach attempts will be made to reach the patient to complete the Transitions of Care (Post Inpatient/ED visit) call.   Signature Karena Addison, LPN [facility] Nurse Health Advisor Direct Dial [phone]

## 2022-08-12 NOTE — Progress Notes (Addendum)
   Inpatient Rehab Admissions Coordinator :  Per OT recommendations, patient was screened for CIR candidacy by Ottie Glazier RN MSN.  At this time patient appears to be a potential candidate for CIR. I will place a rehab consult per protocol for full assessment. Dr Ophelia Charter and TOC SW notified. Please call me with any questions.  Ottie Glazier RN MSN Admissions Coordinator [phone]

## 2022-08-12 NOTE — Inpatient Diabetes Management (Signed)
Inpatient Diabetes Program Recommendations  AACE/ADA: New Consensus Statement on Inpatient Glycemic Control (2015)  Target Ranges:  Prepandial:   less than 140 mg/dL      Peak postprandial:   less than 180 mg/dL (1-2 hours)      Critically ill patients:  140 - 180 mg/dL   Lab Results  Component Value Date   GLUCAP 308 (H) 08/11/2022   HGBA1C 11.3 (H) 01/10/2020    Review of Glycemic Control  Latest Reference Range & Units 08/11/22 15:24 08/11/22 19:56 08/11/22 22:56  Glucose-Capillary 70 - 99 mg/dL 409 (H) 811 (H) 914 (H)  (H): Data is abnormally high  Diabetes history: DM2  Outpatient Diabetes medications: Metformin 1000 mg BID, Ozempic 0.5 mg weekly (not taking) ENDO-Dr. Zenda Alpers visit was 06/20/21  Current orders for Inpatient glycemic control: Novolog 0-9 units TID (increased to 0-15 TID at 17:00) and 0-5 units QHS  Current A1C is pending  Inpatient Diabetes Program Recommendations:    Please consider: Semglee 15 units QD  Will continue to follow while inpatient.  Thank you, Dulce Sellar, MSN, CDCES Diabetes Coordinator Inpatient Diabetes Program [phone] (team pager from 8a-5p)

## 2022-08-12 NOTE — Progress Notes (Signed)
Patient is unable to receive SNF placement from the emergency room. Patient has Medicare Part A and B for insurance that requires a 3 night inpatient stay on the medical floor to be considered for SNF placement. Patient is not [facility] and therefore doesn't qualify for the SNF [facility]. CSW also reviewed patients chart and patient doesn't have a recent admission within the last 30 days.

## 2022-08-12 NOTE — Evaluation (Signed)
Clinical/Bedside Swallow Evaluation Patient Details  Name: SIRCHARLES SANDVIK MRN: [id number] Date of Birth: 12-16-1955  Today's Date: 08/12/2022 Time: SLP Start Time (ACUTE ONLY): 1445 SLP Stop Time (ACUTE ONLY): 1500 SLP Time Calculation (min) (ACUTE ONLY): 15 min  Past Medical History:  Past Medical History:  Diagnosis Date   Allergy to bee sting    Anxiety    Arthritis    "right knee" (05/12/2016)   CAD (coronary artery disease)    a. DES to LCx 04/2016. b. DES to prox Cx 08/2017. c. Stable cath 09/2018 with mild nonobstructive residual disease, normal EF.   Headache    "when his sugar goes too high or too low" (05/12/2016)   HTN (hypertension)    Hyperlipidemia    Hypothyroidism    Macular degeneration    Obesity    PAF (paroxysmal atrial fibrillation) (HCC)    Type II diabetes mellitus (HCC)    Past Surgical History:  Past Surgical History:  Procedure Laterality Date   CARDIAC CATHETERIZATION     COLONOSCOPY     CORONARY ANGIOPLASTY WITH STENT PLACEMENT  05/12/2016   CORONARY STENT INTERVENTION N/A 05/12/2016   Procedure: Coronary Stent Intervention;  Surgeon: Corky Crafts, MD;  Location: [facility] INVASIVE CV LAB;  Service: Cardiovascular;  Laterality: N/A;   CORONARY STENT INTERVENTION N/A 08/31/2017   Procedure: CORONARY STENT INTERVENTION;  Surgeon: Corky Crafts, MD;  Location: [facility] INVASIVE CV LAB;  Service: Cardiovascular;  Laterality: N/A;   GANGLION CYST EXCISION Left 1980s   KNEE ARTHROSCOPY Right    Meniscus removal   LEFT HEART CATH AND CORONARY ANGIOGRAPHY N/A 05/12/2016   Procedure: Left Heart Cath and Coronary Angiography;  Surgeon: Corky Crafts, MD;  Location: [facility] INVASIVE CV LAB;  Service: Cardiovascular;  Laterality: N/A;   LEFT HEART CATH AND CORONARY ANGIOGRAPHY N/A 08/31/2017   Procedure: LEFT HEART CATH AND CORONARY ANGIOGRAPHY;  Surgeon: Corky Crafts, MD;  Location: [facility] INVASIVE CV LAB;  Service: Cardiovascular;  Laterality: N/A;   LEFT  HEART CATHETERIZATION WITH CORONARY ANGIOGRAM N/A 02/04/2012   Procedure: LEFT HEART CATHETERIZATION WITH CORONARY ANGIOGRAM;  Surgeon: Kathleene Hazel, MD;  Location: [facility] CATH LAB;  Service: Cardiovascular;  Laterality: N/A;   POLYPECTOMY     RIGHT/LEFT HEART CATH AND CORONARY ANGIOGRAPHY N/A 09/28/2018   Procedure: RIGHT/LEFT HEART CATH AND CORONARY ANGIOGRAPHY;  Surgeon: Kathleene Hazel, MD;  Location: [facility] INVASIVE CV LAB;  Service: Cardiovascular;  Laterality: N/A;   HPI:  The pt is a 67 yo male presenting 5/22 with increased confusion and frequent falls.     Acute infarcts within the left aspect of the pons, bilateral  thalami and bilateral parietal lobes. The largest infarct is located  within the left aspect of the pons, measuring 16 x 8 mm.  Additionally, there is a 3 mm acute/early subacute infarct within  the mid-to-posterior left frontal lobe white matter. Involvement of  multiple vascular territories, suspicious for an embolic process.    Assessment / Plan / Recommendation  Clinical Impression  Pt demonstrates immediate coughing following consecutive sips of thin and nectar thick liquids. He also has minimal dentition and severe dysarthria, though focal weakness not observed on oral motor exam. suspect dysarthria secondary to slow discoordinated movement though this needs closer evaluation in the days to come. FOr now, pt may have dys 2 diet with honey thick liquids. MBS planned for tomorrow. SLP Visit Diagnosis: Dysphagia, oropharyngeal phase (R13.12)    Aspiration Risk  Moderate aspiration risk  Diet Recommendation Dysphagia 2 (Fine chop);Thin liquid   Medication Administration: Whole meds with puree Supervision: Patient able to self feed    Other  Recommendations      Recommendations for follow up therapy are one component of a multi-disciplinary discharge planning process, led by the attending physician.  Recommendations may be updated based on patient status,  additional functional criteria and insurance authorization.  Follow up Recommendations        Assistance Recommended at Discharge    Functional Status Assessment    Frequency and Duration            Prognosis        Swallow Study   General HPI: The pt is a 67 yo male presenting 5/22 with increased confusion and frequent falls.     Acute infarcts within the left aspect of the pons, bilateral  thalami and bilateral parietal lobes. The largest infarct is located  within the left aspect of the pons, measuring 16 x 8 mm.  Additionally, there is a 3 mm acute/early subacute infarct within  the mid-to-posterior left frontal lobe white matter. Involvement of  multiple vascular territories, suspicious for an embolic process. Type of Study: Bedside Swallow Evaluation Previous Swallow Assessment: no Diet Prior to this Study: NPO Temperature Spikes Noted: No Respiratory Status: Room air History of Recent Intubation: No Behavior/Cognition: Alert;Cooperative;Pleasant mood Oral Cavity Assessment: Within Functional Limits Oral Care Completed by SLP: No Oral Cavity - Dentition: Edentulous Vision: Functional for self-feeding Self-Feeding Abilities: Needs assist Patient Positioning: Upright in bed Baseline Vocal Quality: Normal Volitional Cough: Strong    Oral/Motor/Sensory Function Overall Oral Motor/Sensory Function: Within functional limits   Ice Chips Ice chips: Not tested   Thin Liquid Thin Liquid: Impaired Presentation: Cup;Straw Pharyngeal  Phase Impairments: Cough - Immediate    Nectar Thick Nectar Thick Liquid: Impaired Presentation: Straw Pharyngeal Phase Impairments: Cough - Immediate   Honey Thick Honey Thick Liquid: Within functional limits   Puree Puree: Within functional limits   Solid     Solid: Impaired Presentation: Self Fed Oral Phase Impairments: Impaired mastication      Kenda Kloehn, Riley Nearing 08/12/2022,3:07 PM

## 2022-08-12 NOTE — ED Notes (Signed)
PT at bedside to evaluate patient. Spouse at bedside.

## 2022-08-12 NOTE — ED Notes (Signed)
CBG 286. Wristband not working to scan.

## 2022-08-12 NOTE — ED Notes (Signed)
ED TO INPATIENT HANDOFF REPORT  ED Nurse Name and Phone #: [phone]  S Name/Age/Gender Anthony Woods 66 y.o. male Room/Bed: 009C/009C  Code Status   Code Status: Prior  Home/SNF/Other Home Patient oriented to: self, place, time, and situation Is this baseline? Yes   Triage Complete: Triage complete  Chief Complaint Acute CVA (cerebrovascular accident) [facility]) [I63.9]  Triage Note Pt here via ems from home with increased confusion, multiple falls and hyperglycemia. Seen yesterday for the same and left AMA. Pt with frequent urination, burning with urination, headache. No neuro deficits noted. 20g R wrist. Pt endorses falling multiple times overnight in which he hit his head. Redness noted to forehead. Pt on xarelto. Pt also with slurred speech, states this started yesterday.   BP 150/90 HR 76 RR 93% RA CBG 233   Allergies Allergies  Allergen Reactions   Bee Venom Anaphylaxis    Other reaction(s): Unknown   Actos [Pioglitazone Hydrochloride] Swelling   Lipitor [Atorvastatin] Other (See Comments)    Patient cannot recall reaction   Lisinopril Other (See Comments)    cough    Level of Care/Admitting Diagnosis ED Disposition     ED Disposition  Admit   Condition  --   Comment  Hospital Area: [facility] [[id number]]  Level of Care: Telemetry Medical [[id number]]  May admit patient to [facility] or [facility] if equivalent level of care is available:: No  Covid Evaluation: Asymptomatic - no recent exposure (last 10 days) testing not required  Diagnosis: Acute CVA (cerebrovascular accident) [facility]) [[id number]]  Admitting Physician: Jonah Blue [[id number]]  Attending Physician: Jonah Blue [[id number]]  Certification:: I certify this patient will need inpatient services for at least 2 midnights  Estimated Length of Stay: 3          B Medical/Surgery History Past Medical History:  Diagnosis Date   Allergy to bee sting    Anxiety    Arthritis    "right knee"  (05/12/2016)   CAD (coronary artery disease)    a. DES to LCx 04/2016. b. DES to prox Cx 08/2017. c. Stable cath 09/2018 with mild nonobstructive residual disease, normal EF.   Headache    "when his sugar goes too high or too low" (05/12/2016)   HTN (hypertension)    Hyperlipidemia    Hypothyroidism    Macular degeneration    Obesity    PAF (paroxysmal atrial fibrillation) (HCC)    Type II diabetes mellitus (HCC)    Past Surgical History:  Procedure Laterality Date   CARDIAC CATHETERIZATION     COLONOSCOPY     CORONARY ANGIOPLASTY WITH STENT PLACEMENT  05/12/2016   CORONARY STENT INTERVENTION N/A 05/12/2016   Procedure: Coronary Stent Intervention;  Surgeon: Corky Crafts, MD;  Location: [facility] INVASIVE CV LAB;  Service: Cardiovascular;  Laterality: N/A;   CORONARY STENT INTERVENTION N/A 08/31/2017   Procedure: CORONARY STENT INTERVENTION;  Surgeon: Corky Crafts, MD;  Location: [facility] INVASIVE CV LAB;  Service: Cardiovascular;  Laterality: N/A;   GANGLION CYST EXCISION Left 1980s   KNEE ARTHROSCOPY Right    Meniscus removal   LEFT HEART CATH AND CORONARY ANGIOGRAPHY N/A 05/12/2016   Procedure: Left Heart Cath and Coronary Angiography;  Surgeon: Corky Crafts, MD;  Location: [facility] INVASIVE CV LAB;  Service: Cardiovascular;  Laterality: N/A;   LEFT HEART CATH AND CORONARY ANGIOGRAPHY N/A 08/31/2017   Procedure: LEFT HEART CATH AND CORONARY ANGIOGRAPHY;  Surgeon: Corky Crafts, MD;  Location: [facility] INVASIVE CV  LAB;  Service: Cardiovascular;  Laterality: N/A;   LEFT HEART CATHETERIZATION WITH CORONARY ANGIOGRAM N/A 02/04/2012   Procedure: LEFT HEART CATHETERIZATION WITH CORONARY ANGIOGRAM;  Surgeon: Kathleene Hazel, MD;  Location: [facility] CATH LAB;  Service: Cardiovascular;  Laterality: N/A;   POLYPECTOMY     RIGHT/LEFT HEART CATH AND CORONARY ANGIOGRAPHY N/A 09/28/2018   Procedure: RIGHT/LEFT HEART CATH AND CORONARY ANGIOGRAPHY;  Surgeon: Kathleene Hazel, MD;  Location: [facility]  INVASIVE CV LAB;  Service: Cardiovascular;  Laterality: N/A;     A IV Location/Drains/Wounds Patient Lines/Drains/Airways Status     Active Line/Drains/Airways     Name Placement date Placement time Site Days   Peripheral IV 08/11/22 18 G Right Antecubital 08/11/22  1421  Antecubital  1   Peripheral IV 08/11/22 20 G Anterior;Left Forearm 08/11/22  --  Forearm  1            Intake/Output Last 24 hours No intake or output data in the 24 hours ending 08/12/22 1146  Labs/Imaging Results for orders placed or performed during the hospital encounter of 08/11/22 (from the past 48 hour(s))  Comprehensive metabolic panel     Status: Abnormal   Collection Time: 08/11/22  2:17 PM  Result Value Ref Range   Sodium 135 135 - 145 mmol/L   Potassium 3.6 3.5 - 5.1 mmol/L   Chloride 99 98 - 111 mmol/L   CO2 23 22 - 32 mmol/L   Glucose, Bld 338 (H) 70 - 99 mg/dL    Comment: Glucose reference range applies only to samples taken after fasting for at least 8 hours.   BUN 5 (L) 8 - 23 mg/dL   Creatinine, Ser 1.61 0.61 - 1.24 mg/dL   Calcium 9.7 8.9 - 09.6 mg/dL   Total Protein 6.7 6.5 - 8.1 g/dL   Albumin 3.8 3.5 - 5.0 g/dL   AST 18 15 - 41 U/L   ALT 16 0 - 44 U/L   Alkaline Phosphatase 97 38 - 126 U/L   Total Bilirubin 1.2 0.3 - 1.2 mg/dL   GFR, Estimated >04 >54 mL/min    Comment: (NOTE) Calculated using the CKD-EPI Creatinine Equation (2021)    Anion gap 13 5 - 15    Comment: Performed at [facility] Lab, [address], [place], [place] [address]  CBC with Differential     Status: None   Collection Time: 08/11/22  2:17 PM  Result Value Ref Range   WBC 8.3 4.0 - 10.5 K/uL   RBC 5.25 4.22 - 5.81 MIL/uL   Hemoglobin 16.4 13.0 - 17.0 g/dL   HCT 91.4 78.2 - 95.6 %   MCV 90.5 80.0 - 100.0 fL   MCH 31.2 26.0 - 34.0 pg   MCHC 34.5 30.0 - 36.0 g/dL   RDW 21.3 08.6 - 57.8 %   Platelets 232 150 - 400 K/uL   nRBC 0.0 0.0 - 0.2 %   Neutrophils Relative % 71 %   Neutro Abs 5.9 1.7 -  7.7 K/uL   Lymphocytes Relative 20 %   Lymphs Abs 1.7 0.7 - 4.0 K/uL   Monocytes Relative 7 %   Monocytes Absolute 0.6 0.1 - 1.0 K/uL   Eosinophils Relative 1 %   Eosinophils Absolute 0.1 0.0 - 0.5 K/uL   Basophils Relative 1 %   Basophils Absolute 0.1 0.0 - 0.1 K/uL   Immature Granulocytes 0 %   Abs Immature Granulocytes 0.03 0.00 - 0.07 K/uL    Comment: Performed at [facility], [address], [place], [place] [address]  Troponin I (High Sensitivity)     Status: None   Collection Time: 08/11/22  2:17 PM  Result Value Ref Range   Troponin I (High Sensitivity) 9 <18 ng/L    Comment: (NOTE) Elevated high sensitivity troponin I (hsTnI) values and significant  changes across serial measurements may suggest ACS but many other  chronic and acute conditions are known to elevate hsTnI results.  Refer to the "Links" section for chest pain algorithms and additional  guidance. Performed at [facility], [address] [place], [place] [address]   I-Stat venous blood gas, ED     Status: Abnormal   Collection Time: 08/11/22  2:32 PM  Result Value Ref Range   pH, Ven 7.371 7.25 - 7.43   pCO2, Ven 40.4 (L) 44 - 60 mmHg   pO2, Ven 40 32 - 45 mmHg   Bicarbonate 23.4 20.0 - 28.0 mmol/L   TCO2 25 22 - 32 mmol/L   O2 Saturation 73 %   Acid-base deficit 2.0 0.0 - 2.0 mmol/L   Sodium 135 135 - 145 mmol/L   Potassium 3.7 3.5 - 5.1 mmol/L   Calcium, Ion 1.22 1.15 - 1.40 mmol/L   HCT 48.0 39.0 - 52.0 %   Hemoglobin 16.3 13.0 - 17.0 g/dL   Sample type VENOUS   Urinalysis, w/ Reflex to Culture (Infection Suspected) -Urine, Clean Catch     Status: Abnormal   Collection Time: 08/11/22  3:20 PM  Result Value Ref Range   Specimen Source URINE, CLEAN CATCH    Color, Urine YELLOW YELLOW   APPearance CLEAR CLEAR   Specific Gravity, Urine 1.028 1.005 - 1.030   pH 5.0 5.0 - 8.0   Glucose, UA >=500 (A) NEGATIVE mg/dL   Hgb urine dipstick SMALL (A) NEGATIVE   Bilirubin Urine NEGATIVE  NEGATIVE   Ketones, ur 20 (A) NEGATIVE mg/dL   Protein, ur >=638 (A) NEGATIVE mg/dL   Nitrite NEGATIVE NEGATIVE   Leukocytes,Ua NEGATIVE NEGATIVE   RBC / HPF 0-5 0 - 5 RBC/hpf   WBC, UA 0-5 0 - 5 WBC/hpf    Comment:        Reflex urine culture not performed if WBC <=10, OR if Squamous epithelial cells >5. If Squamous epithelial cells >5 suggest recollection.    Bacteria, UA NONE SEEN NONE SEEN   Squamous Epithelial / HPF 0-5 0 - 5 /HPF    Comment: Performed at [facility], [address] [place], [place] [address]  CBG monitoring, ED     Status: Abnormal   Collection Time: 08/11/22  3:24 PM  Result Value Ref Range   Glucose-Capillary 306 (H) 70 - 99 mg/dL    Comment: Glucose reference range applies only to samples taken after fasting for at least 8 hours.  Troponin I (High Sensitivity)     Status: None   Collection Time: 08/11/22  4:26 PM  Result Value Ref Range   Troponin I (High Sensitivity) 10 <18 ng/L    Comment: (NOTE) Elevated high sensitivity troponin I (hsTnI) values and significant  changes across serial measurements may suggest ACS but many other  chronic and acute conditions are known to elevate hsTnI results.  Refer to the "Links" section for chest pain algorithms and additional  guidance. Performed at [facility], [address] [place], [place] [address]   CBG monitoring, ED     Status: Abnormal   Collection  Time: 08/11/22  7:56 PM  Result Value Ref Range   Glucose-Capillary 447 (H) 70 - 99 mg/dL    Comment: Glucose reference range applies only to samples taken after fasting for at least 8 hours.  Basic metabolic panel     Status: Abnormal   Collection Time: 08/11/22  8:14 PM  Result Value Ref Range   Sodium 134 (L) 135 - 145 mmol/L   Potassium 3.9 3.5 - 5.1 mmol/L   Chloride 97 (L) 98 - 111 mmol/L   CO2 24 22 - 32 mmol/L   Glucose, Bld 443 (H) 70 - 99 mg/dL    Comment: Glucose reference range applies only to samples taken after fasting for  at least 8 hours.   BUN 8 8 - 23 mg/dL   Creatinine, Ser 1.61 0.61 - 1.24 mg/dL   Calcium 9.8 8.9 - 09.6 mg/dL   GFR, Estimated >04 >54 mL/min    Comment: (NOTE) Calculated using the CKD-EPI Creatinine Equation (2021)    Anion gap 13 5 - 15    Comment: Performed at [facility], [address] [place], [place] [address]  CBG monitoring, ED     Status: Abnormal   Collection Time: 08/11/22 10:56 PM  Result Value Ref Range   Glucose-Capillary 308 (H) 70 - 99 mg/dL    Comment: Glucose reference range applies only to samples taken after fasting for at least 8 hours.   MR BRAIN WO CONTRAST  Result Date: 08/12/2022 CLINICAL DATA:  Provided history: Neuro deficit, acute, stroke suspected. Fall. Altered mental status. EXAM: MRI HEAD WITHOUT CONTRAST TECHNIQUE: Multiplanar, multiecho pulse sequences of the brain and surrounding structures were obtained without intravenous contrast. COMPARISON:  Head CT 08/11/2022. FINDINGS: Intermittently motion degraded examination. Most notably, the sagittal T1-weighted sequence is severely motion degraded. Within this limitation, findings are as follows. Brain: Mild-to-moderate generalized cerebral atrophy. 16 x 8 mm acute infarct within the left aspect of the pons (series 2, image 14). 6 mm acute infarct within the right thalamus (series 2, image 24). 7 mm acute infarct within the left thalamus (series 2, image 23). 3 mm acute cortical infarct within the inferomedial left parietal lobe (series 3, image 13). 6 mm acute infarct within the right parietal white matter (series 2, image 30) (series 3, image 11). 3 mm acute/early subacute infarct within the mid-to-posterior left frontal lobe white matter (series 2, image 33) (series 3, image 21). Chronic lacunar infarcts within the bilateral corona radiata and thalami. Background moderate multifocal T2 FLAIR hyperintense signal abnormality within the cerebral white matter, nonspecific but compatible with chronic small  vessel ischemic disease. No evidence of an intracranial mass. No extra-axial fluid collection. No midline shift. Vascular: Maintained flow voids within the proximal large arterial vessels. Skull and upper cervical spine: No focal suspicious marrow lesion. Sinuses/Orbits: No mass or acute finding within the imaged orbits. No significant paranasal sinus disease. IMPRESSION: 1. Motion degraded examination. 2. 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 The largest infarct is located within the left aspect of the pons, measuring 16 x 8 mm. Additionally, there is a 3 mm acute/early subacute infarct within the mid-to-posterior left frontal lobe white matter. Involvement of multiple vascular territories, suspicious for an embolic process. 3. Background parenchymal atrophy, chronic small vessel ischemic disease and chronic lacunar infarcts as described. Electronically Signed   By: Jackey Loge D.O.   On: 08/12/2022 10:20   CT Foot Left Wo Contrast  Result Date: 08/12/2022 CLINICAL DATA:  Foot trauma, occult fracture suspected. EXAM: CT OF THE LEFT FOOT WITHOUT CONTRAST TECHNIQUE: Multidetector CT imaging of the left foot was performed according to the standard protocol. Multiplanar CT image reconstructions were also generated. RADIATION DOSE REDUCTION: This exam was performed according to the departmental dose-optimization program which includes automated exposure control, adjustment of the mA and/or kV according to patient size and/or use of iterative reconstruction technique. COMPARISON:  Radiographs dated Aug 11, 2022 FINDINGS: Bones/Joint/Cartilage There is minimal cortical irregularity about the lateral aspect of the proximal phalanx of the fifth digit which could be due to osseous remodeling or a nondisplaced fracture. There is generalized osteopenia. Mild arthritic changes of the tibiotalar and subtalar joints with marginal spurring. No appreciable fracture  of the calcaneus. Ligaments Suboptimally assessed by CT. Muscles and Tendons Muscles are normal in bulk. Tendons of the flexor and extensor compartments appear intact. Mild Achilles enthesopathy. Soft tissues Skin and subcutaneous soft tissues are within normal limits. No fluid collection or hematoma. IMPRESSION: 1. Minimal cortical irregularity about the lateral aspect of the proximal phalanx of the fifth digit which could be due to osseous remodeling or a nondisplaced fracture. 2. Generalized osteopenia. 3. Mild arthritic changes of the tibiotalar and subtalar joints. Electronically Signed   By: Larose Hires D.O.   On: 08/12/2022 09:56   DG Ribs Bilateral W/Chest  Result Date: 08/11/2022 CLINICAL DATA:  Fall, left rib pain EXAM: BILATERAL RIBS AND CHEST - 9 VIEW COMPARISON:  Chest radiograph 08/10/2022 FINDINGS: Atherosclerotic calcification of the aortic arch. Thoracic spondylosis noted. Cardiac and mediastinal margins appear normal. No pneumothorax. The lungs appear clear. No blunting of the costophrenic angles. Subtle irregularity of the right right anterior ninth rib on 1 of the oblique projections, cannot exclude subtle fracture. IMPRESSION: 1. Subtle irregularity of the right anterior ninth rib on one of the oblique projections, cannot exclude a subtle fracture. No other potential rib fractures are detected. 2. Thoracic spondylosis. 3.  Aortic Atherosclerosis (ICD10-I70.0). Electronically Signed   By: Gaylyn Rong M.D.   On: 08/11/2022 15:13   DG Foot Complete Left  Result Date: 08/11/2022 CLINICAL DATA:  Fall, lateral foot pain EXAM: LEFT FOOT - COMPLETE 3+ VIEW COMPARISON:  None Available. FINDINGS: There is mild cortical irregularity in the lateral metaphysis of the proximal phalanx small toe which could be an indicator of a relatively nondisplaced fracture. The finding is subtle and medial extension is poorly defined. Correlate with point tenderness in this vicinity. Bony demineralization  and mild cortical indistinctness along portions of the hindfoot including the posterior subtalar joint and anterior calcaneal process. Linear calcifications in the distal Achilles tendon. Mild cortical irregularity along the dorsal talar neck, probably from spurring. IMPRESSION: 1. Subtle cortical irregularity in the lateral metaphysis of the proximal phalanx small toe could be an indicator of a relatively nondisplaced fracture. Correlate with point tenderness in this vicinity. 2. Bony demineralization and mild cortical indistinctness along portions of the hindfoot. If the patient is unable to bear weight or if there is a high clinical suspicion of hindfoot injury, dedicated CT of the foot should be considered. 3. Linear calcifications in the distal Achilles tendon compatible with mild calcific tendinopathy. Electronically Signed   By: Gaylyn Rong M.D.   On: 08/11/2022 15:08   CT Head Wo Contrast  Result Date: 08/11/2022 CLINICAL DATA:  Fall, altered mental status EXAM: CT HEAD WITHOUT CONTRAST CT CERVICAL SPINE WITHOUT CONTRAST TECHNIQUE: Multidetector CT imaging of the head and  cervical spine was performed following the standard protocol without intravenous contrast. Multiplanar CT image reconstructions of the cervical spine were also generated. RADIATION DOSE REDUCTION: This exam was performed according to the departmental dose-optimization program which includes automated exposure control, adjustment of the mA and/or kV according to patient size and/or use of iterative reconstruction technique. COMPARISON:  08/10/2022 FINDINGS: CT HEAD FINDINGS Brain: No evidence of acute infarction, hemorrhage, hydrocephalus, extra-axial collection or mass lesion/mass effect. Periventricular and deep white matter hypodensity. Vascular: No hyperdense vessel or unexpected calcification. Skull: Normal. Negative for fracture or focal lesion. Sinuses/Orbits: No acute finding. Other: None. CT CERVICAL SPINE FINDINGS  Alignment: Degenerative straightening of the normal cervical lordosis. Skull base and vertebrae: No acute fracture. No primary bone lesion or focal pathologic process. Soft tissues and spinal canal: No prevertebral fluid or swelling. No visible canal hematoma. Disc levels: Moderate multilevel disc space height loss and osteophytosis. Upper chest: Negative. Other: None. IMPRESSION: 1. No acute intracranial pathology. Small-vessel white matter disease. 2. No fracture or static subluxation of the cervical spine. 3. Moderate multilevel cervical disc degenerative disease. Electronically Signed   By: Jearld Lesch M.D.   On: 08/11/2022 14:59   CT Cervical Spine Wo Contrast  Result Date: 08/11/2022 CLINICAL DATA:  Fall, altered mental status EXAM: CT HEAD WITHOUT CONTRAST CT CERVICAL SPINE WITHOUT CONTRAST TECHNIQUE: Multidetector CT imaging of the head and cervical spine was performed following the standard protocol without intravenous contrast. Multiplanar CT image reconstructions of the cervical spine were also generated. RADIATION DOSE REDUCTION: This exam was performed according to the departmental dose-optimization program which includes automated exposure control, adjustment of the mA and/or kV according to patient size and/or use of iterative reconstruction technique. COMPARISON:  08/10/2022 FINDINGS: CT HEAD FINDINGS Brain: No evidence of acute infarction, hemorrhage, hydrocephalus, extra-axial collection or mass lesion/mass effect. Periventricular and deep white matter hypodensity. Vascular: No hyperdense vessel or unexpected calcification. Skull: Normal. Negative for fracture or focal lesion. Sinuses/Orbits: No acute finding. Other: None. CT CERVICAL SPINE FINDINGS Alignment: Degenerative straightening of the normal cervical lordosis. Skull base and vertebrae: No acute fracture. No primary bone lesion or focal pathologic process. Soft tissues and spinal canal: No prevertebral fluid or swelling. No visible  canal hematoma. Disc levels: Moderate multilevel disc space height loss and osteophytosis. Upper chest: Negative. Other: None. IMPRESSION: 1. No acute intracranial pathology. Small-vessel white matter disease. 2. No fracture or static subluxation of the cervical spine. 3. Moderate multilevel cervical disc degenerative disease. Electronically Signed   By: Jearld Lesch M.D.   On: 08/11/2022 14:59   CT Knee Right Wo Contrast  Result Date: 08/10/2022 CLINICAL DATA:  Knee trauma, occult fracture suspected. Pain in right knee. EXAM: CT OF THE RIGHT KNEE WITHOUT CONTRAST TECHNIQUE: Multidetector CT imaging of the right knee was performed according to the standard protocol. Multiplanar CT image reconstructions were also generated. RADIATION DOSE REDUCTION: This exam was performed according to the departmental dose-optimization program which includes automated exposure control, adjustment of the mA and/or kV according to patient size and/or use of iterative reconstruction technique. COMPARISON:  Knee radiographs 08/10/2022 FINDINGS: Bones/Joint/Cartilage No acute fracture or dislocation. 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 Small knee joint effusion. Chondrocalcinosis in the medial and lateral compartments. The deformity seen on same day radiographs about the medial femoral condyle was projectional or due to remote trauma. Ligaments Suboptimally assessed by CT. Muscles and Tendons No acute abnormality. Soft tissues Mild subcutaneous edema about  the anterior knee. IMPRESSION: 1. No acute fracture or dislocation. 2. 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 3. Small knee joint effusion. 4. Chondrocalcinosis in the medial and lateral compartments. Electronically Signed   By: Minerva Fester M.D.   On: 08/10/2022 17:21   DG Pelvis 1-2 Views  Result Date:  08/10/2022 CLINICAL DATA:  Status post fall. EXAM: PELVIS - 1-2 VIEW COMPARISON:  None Available. FINDINGS: There is no evidence of pelvic fracture or diastasis. No pelvic bone lesions are seen. Mild to moderate severity degenerative changes seen involving both hips, in the form of joint space narrowing and acetabular sclerosis. IMPRESSION: Degenerative changes involving both hips. Electronically Signed   By: Aram Candela M.D.   On: 08/10/2022 16:06   DG Knee Complete 4 Views Right  Result Date: 08/10/2022 CLINICAL DATA:  Multiple falls. EXAM: RIGHT KNEE - COMPLETE 4+ VIEW COMPARISON:  June 09, 2005 FINDINGS: A small cortical deformity of indeterminate age is seen involving the medial epicondyle of the distal right femur. There is no evidence of dislocation. Medial and lateral chondrocalcinosis is seen with marked severity medial tibiofemoral joint space narrowing. A small joint effusion is noted. IMPRESSION: Fracture deformity of indeterminate age involving the medial epicondyle of the distal right femur. CT correlation is recommended. Electronically Signed   By: Aram Candela M.D.   On: 08/10/2022 16:05   DG Chest 2 View  Result Date: 08/10/2022 CLINICAL DATA:  Status post multiple falls. EXAM: CHEST - 2 VIEW COMPARISON:  September 08, 2018 FINDINGS: The heart size and mediastinal contours are within normal limits. There is moderate severity calcification of the aortic arch. Mild atelectasis is seen within the bilateral lung bases. There is no evidence of an acute infiltrate, pleural effusion or pneumothorax. The visualized skeletal structures are unremarkable. IMPRESSION: No acute cardiopulmonary disease. Electronically Signed   By: Aram Candela M.D.   On: 08/10/2022 16:03   CT Head Wo Contrast  Result Date: 08/10/2022 CLINICAL DATA:  Head trauma, minor (Age >= 65y); Neck trauma (Age >= 65y) EXAM: CT HEAD WITHOUT CONTRAST CT CERVICAL SPINE WITHOUT CONTRAST TECHNIQUE: Multidetector CT imaging  of the head and cervical spine was performed following the standard protocol without intravenous contrast. Multiplanar CT image reconstructions of the cervical spine were also generated. RADIATION DOSE REDUCTION: This exam was performed according to the departmental dose-optimization program which includes automated exposure control, adjustment of the mA and/or kV according to patient size and/or use of iterative reconstruction technique. COMPARISON:  None Available. FINDINGS: CT HEAD FINDINGS Brain: No evidence of acute cortical infarction, hemorrhage, hydrocephalus, extra-axial collection or mass lesion/mass effect. 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 Vascular: No hyperdense vessel or unexpected calcification. Skull: Normal. Negative for fracture or focal lesion. Sinuses/Orbits: No middle ear or mastoid effusion. Paranasal sinuses are clear. Orbits are unremarkable. Other: None. CT CERVICAL SPINE FINDINGS Alignment: There is straightening of the normal cervical lordosis. Grade 1 anterolisthesis of C3 on C4. Skull base and vertebrae: No acute fracture. No primary bone lesion or focal pathologic process. Soft tissues and spinal canal: No prevertebral fluid or swelling. No visible canal hematoma. Disc levels:  There is moderate spinal canal stenosis at C5-C6. Upper chest: Negative. Other: Atherosclerotic vascular calcifications. Bilateral tonsilliths. IMPRESSION: 1. No acute intracranial abnormality. 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 2.  No acute fracture or traumatic subluxation of the cervical spine. Electronically Signed   By: Lorenza Cambridge M.D.   On: 08/10/2022 15:58   CT Cervical Spine Wo Contrast  Result Date: 08/10/2022 CLINICAL DATA:  Head trauma, minor (Age >= 65y); Neck trauma (Age >= 65y) EXAM: CT HEAD WITHOUT  CONTRAST CT CERVICAL SPINE WITHOUT CONTRAST TECHNIQUE: Multidetector CT imaging of the head and cervical spine was performed following the standard protocol without intravenous contrast. Multiplanar CT image reconstructions of the cervical spine were also generated. RADIATION DOSE REDUCTION: This exam was performed according to the departmental dose-optimization program which includes automated exposure control, adjustment of the mA and/or kV according to patient size and/or use of iterative reconstruction technique. COMPARISON:  None Available. FINDINGS: CT HEAD FINDINGS Brain: No evidence of acute cortical infarction, hemorrhage, hydrocephalus, extra-axial collection or mass lesion/mass effect. 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 Vascular: No hyperdense vessel or unexpected calcification. Skull: Normal. Negative for fracture or focal lesion. Sinuses/Orbits: No middle ear or mastoid effusion. Paranasal sinuses are clear. Orbits are unremarkable. Other: None. CT CERVICAL SPINE FINDINGS Alignment: There is straightening of the normal cervical lordosis. Grade 1 anterolisthesis of C3 on C4. Skull base and vertebrae: No acute fracture. No primary bone lesion or focal pathologic process. Soft tissues and spinal canal: No prevertebral fluid or swelling. No visible canal hematoma. Disc levels:  There is moderate spinal canal stenosis at C5-C6. Upper chest: Negative. Other: Atherosclerotic vascular calcifications. Bilateral tonsilliths. IMPRESSION: 1. No acute intracranial abnormality. 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 2. No acute fracture or traumatic subluxation of the cervical spine. Electronically Signed   By: Lorenza Cambridge M.D.   On: 08/10/2022 15:58    Pending Labs Unresulted Labs (From admission, onward)     Start     Ordered    08/12/22 1145  Ammonia  Once,   STAT        08/12/22 1145   08/12/22 0856  Lactic acid, plasma  Now then every 2 hours,   R      08/12/22 0855   08/12/22 0856  Basic metabolic panel  Once,   STAT        08/12/22 0855   08/12/22 0855  TSH  Once,   URGENT        08/12/22 0855   08/12/22 0855  CK  Once,   URGENT        08/12/22 0855   08/11/22 1849  Hemoglobin A1c  Once,   AD       Comments: To assess prior glycemic control    08/11/22 1848   08/11/22 1417  Ethanol  Once,   URGENT        08/11/22 1417   08/11/22 1417  Urine rapid drug screen (hosp performed)  Once,   STAT        08/11/22 1417            Vitals/Pain Today's Vitals   08/12/22 0202 08/12/22 0203 08/12/22 0622 08/12/22 0705  BP: 113/63  110/79   Pulse: 81 67 88   Resp:   12   Temp:   97.6 F (36.4 C)   TempSrc:   Oral   SpO2: 91% 96% 95%   Weight:      Height:      PainSc:    Asleep  Isolation Precautions No active isolations  Medications Medications  acetaminophen (TYLENOL) tablet 500 mg (has no administration in time range)  amLODipine (NORVASC) tablet 5 mg (5 mg Oral Given 08/12/22 0803)  aspirin EC tablet 81 mg (81 mg Oral Given 08/12/22 0802)  escitalopram (LEXAPRO) tablet 10 mg (10 mg Oral Given 08/12/22 0803)  isosorbide mononitrate (IMDUR) 24 hr tablet 30 mg (30 mg Oral Given 08/12/22 0803)  levothyroxine (SYNTHROID) tablet 175 mcg (175 mcg Oral Given 08/12/22 0802)  losartan (COZAAR) tablet 100 mg (100 mg Oral Given 08/12/22 0803)  rivaroxaban (XARELTO) tablet 20 mg (20 mg Oral Given 08/12/22 0803)  rosuvastatin (CRESTOR) tablet 20 mg (20 mg Oral Given 08/12/22 0803)  insulin aspart (novoLOG) injection 0-5 Units (4 Units Subcutaneous Given 08/11/22 2302)  insulin aspart (novoLOG) injection 0-9 Units (5 Units Subcutaneous Given 08/12/22 0755)    Mobility walks with person assist     Focused Assessments Neuro Assessment Handoff:  Swallow screen pass? Unsure  Cardiac Rhythm: Normal sinus  rhythm       Neuro Assessment: Exceptions to WDL Neuro Checks:      Has TPA been given? No If patient is a Neuro Trauma and patient is going to OR before floor call report to 4N Charge nurse: [phone] or [phone]   R Recommendations: See Admitting Provider Note  Report given to:   Additional Notes:

## 2022-08-12 NOTE — Progress Notes (Addendum)
Orthopedic Tech Progress Note Patient Details:  Anthony Woods 10/27/1955 [id number]  PO shoe delivered by Tyson Babinski, ortho tech. Day shift RN stated she buddy taped his 4th and 5th toes per the order.  Ortho Devices Type of Ortho Device: Postop shoe/boot Ortho Device/Splint Location: at bedside Ortho Device/Splint Interventions: Ordered   Post Interventions Instructions Provided: Care of device, Adjustment of device  [name] [name] 08/12/2022, 7:31 PM

## 2022-08-12 NOTE — Evaluation (Signed)
Occupational Therapy Evaluation Patient Details Name: Anthony Woods MRN: [id number] DOB: 08-26-1955 Today's Date: 08/12/2022   History of Present Illness The pt is a 67 yo male presenting 5/22 with increased confusion and frequent falls. 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 The largest infarct is located  within the left aspect of the pons, measuring 16 x 8 mm.  Additionally, there is a 3 mm acute/early subacute infarct within  the mid-to-posterior left frontal lobe white matter. Marland Kitchen PMH includes: anxiety, CAD, HTN, HLD, afib, and DM II.   Clinical Impression   Pt currently max assist for simulated selfcare sit to stand EOB.  Increased lean and LOB to the right and posteriorly with dressing tasks.  Mod assist for sit to stand with max assist for taking steps forward and backwards with the RW.  Increased ataxia noted in standing and with using the RUE.  Was independent with occasional use of an assistive device before 3 weeks ago.  Now with progressive weakness at level spouse cannot assist.  Feel he will benefit from acute care OT at this time to help increase balance, RUE functional use, and ADL performance.  Patient will benefit from intensive inpatient follow up therapy, >3 hours/day.       Recommendations for follow up therapy are one component of a multi-disciplinary discharge planning process, led by the attending physician.  Recommendations may be updated based on patient status, additional functional criteria and insurance authorization.   Assistance Recommended at Discharge Frequent or constant Supervision/Assistance  Patient can return home with the following A lot of help with walking and/or transfers;A lot of help with bathing/dressing/bathroom;Assist for transportation;Help with stairs or ramp for entrance;Direct supervision/assist for medications management;Assistance with cooking/housework;Direct  supervision/assist for financial management    Functional Status Assessment  Patient has had a recent decline in their functional status and demonstrates the ability to make significant improvements in function in a reasonable and predictable amount of time.  Equipment Recommendations  Other (comment) (TBD next venue of care)    Recommendations for Other Services Rehab consult     Precautions / Restrictions Precautions Precautions: Fall Precaution Comments: pt with recent increase in falls in last 3 weeks, no longer able to get up Restrictions Weight Bearing Restrictions: No      Mobility Bed Mobility Overal bed mobility: Needs Assistance Bed Mobility: Supine to Sit, Sit to Supine     Supine to sit: Min assist Sit to supine: Min assist   General bed mobility comments: Min assist with increased time and use of the bed rails to sit up.  Min assist for scooting out to the edge of the bed.  Assist needed to lift LEs back in the bed.    Transfers Overall transfer level: Needs assistance Equipment used: Rolling walker (2 wheels) Transfers: Sit to/from Stand, Bed to chair/wheelchair/BSC Sit to Stand: Mod assist     Step pivot transfers: Max assist     General transfer comment: Decreased efficiency with advancing the LLE with stepping.  Ataxia noted as well      Balance Overall balance assessment: Needs assistance Sitting-balance support: No upper extremity supported, Feet supported Sitting balance-Leahy Scale: Poor Sitting balance - Comments: posterior and right LOB in sitting Postural control: Posterior lean Standing balance support: Bilateral upper extremity supported, During functional activity Standing balance-Leahy Scale: Zero Standing balance comment: max assist for standing balance with need for assistive device and therapist assist  for mobility                           ADL either performed or assessed with clinical judgement   ADL Overall ADL's :  Needs assistance/impaired Eating/Feeding: Set up;Sitting Eating/Feeding Details (indicate cue type and reason): simulated Grooming: Sitting;Wash/dry hands;Wash/dry face Grooming Details (indicate cue type and reason): simulated Upper Body Bathing: Set up;Sitting Upper Body Bathing Details (indicate cue type and reason): simulated Lower Body Bathing: Maximal assistance;Sit to/from stand Lower Body Bathing Details (indicate cue type and reason): simulated     Lower Body Dressing: Maximal assistance;Sit to/from stand   Toilet Transfer: Maximal assistance;Ambulation;Rolling walker (2 wheels) Toilet Transfer Details (indicate cue type and reason): simulated Toileting- Clothing Manipulation and Hygiene: Maximal assistance;Sit to/from stand Toileting - Clothing Manipulation Details (indicate cue type and reason): simulated     Functional mobility during ADLs: Maximal assistance;Rolling walker (2 wheels) (ambulate forward and backwards a few steps) General ADL Comments: Pt with increased ataxia noted in the RUE with functional use.  LOB to the right in static sitting without UE support as well as posteriorly when attempting to donn his gripper socks.  Decreased ability to advance his LEs efficiently and weightshift to the right for stepping forward with the LUE.     Vision Baseline Vision/History: 1 Wears glasses (increased blurry vision) Ability to See in Adequate Light: 1 Impaired Patient Visual Report: Blurring of vision Vision Assessment?: Yes Eye Alignment: Within Functional Limits Ocular Range of Motion: Within Functional Limits Alignment/Gaze Preference: Within Defined Limits Tracking/Visual Pursuits: Decreased smoothness of horizontal tracking;Decreased smoothness of vertical tracking Visual Fields: No apparent deficits            Pertinent Vitals/Pain Pain Assessment Pain Assessment: Faces Pain Location: right knee Pain Descriptors / Indicators: Discomfort Pain  Intervention(s): Limited activity within patient's tolerance, Repositioned     Hand Dominance Right   Extremity/Trunk Assessment Upper Extremity Assessment Upper Extremity Assessment: RUE deficits/detail RUE Deficits / Details: Strength 4/5 throughout.  Slight ataxia noted with movements and functional use, dropping of sock when attempting to donn with slower opposition to thumb will all digits noted. RUE Sensation: decreased light touch (decreased light touch noted in the hand) RUE Coordination: decreased fine motor;decreased gross motor LUE Deficits / Details: strength WFLs. slightly slower finger to nose testing LUE Coordination: decreased fine motor   Lower Extremity Assessment Lower Extremity Assessment: Defer to PT evaluation RLE Deficits / Details: good strength to MMT, poor coordination and motor planning RLE Coordination: decreased fine motor;decreased gross motor LLE Deficits / Details: good strength to MMT, poor coordination and motor planning LLE Coordination: decreased fine motor;decreased gross motor   Cervical / Trunk Assessment Cervical / Trunk Assessment: Kyphotic   Communication Communication Communication: No difficulties   Cognition Arousal/Alertness: Awake/alert Behavior During Therapy: WFL for tasks assessed/performed Overall Cognitive Status: Impaired/Different from baseline Area of Impairment: Orientation                 Orientation Level: Disoriented to (disoriented to place) Current Attention Level: Sustained Memory: Decreased short-term memory (1/3 word recall after 2 min delay) Following Commands: Follows one step commands with increased time, Follows multi-step commands with increased time Safety/Judgement: Decreased awareness of deficits Awareness: Intellectual Problem Solving: Requires tactile cues, Requires verbal cues       General Comments  wife present and supportive. abrasion on R forehead            Home Living  Family/patient expects to be discharged to:: Private residence Living Arrangements: Spouse/significant other Available Help at Discharge: Available 24 hours/day (wife unable to physically assist) Type of Home: Mobile home Home Access: Stairs to enter [employer] of Steps: 4 Entrance Stairs-Rails: Right;Left Home Layout: One level     Bathroom Shower/Tub: Producer, television/film/video: Standard     Home Equipment: Agricultural consultant (2 wheels);Rollator (4 wheels);Cane - single point;Shower seat          Prior Functioning/Environment Prior Level of Function : Independent/Modified Independent;History of Falls (last six months)             Mobility Comments: until last 3 weeks, pt independent. but then had increase in falls. originally able to get up with wife's assist, but now is not able to get up even with her help. does use RW ADLs Comments: prior to last 3 weeks, pt was independent but now will only shower or dress when he feels like it        OT Problem List: Decreased strength;Impaired balance (sitting and/or standing);Decreased safety awareness;Decreased cognition;Impaired UE functional use;Pain;Impaired sensation;Decreased coordination;Decreased knowledge of use of DME or AE;Impaired vision/perception      OT Treatment/Interventions: Self-care/ADL training;Patient/family education;Balance training;Neuromuscular education;Therapeutic activities;DME and/or AE instruction;Cognitive remediation/compensation;Therapeutic exercise;Visual/perceptual remediation/compensation    OT Goals(Current goals can be found in the care plan section) Acute Rehab OT Goals Patient Stated Goal: Pt wants to get better so he is not falling. OT Goal Formulation: With patient Time For Goal Achievement: 08/26/22 Potential to Achieve Goals: Good  OT Frequency: Min 2X/week       AM-PAC OT "6 Clicks" Daily Activity     Outcome Measure Help from another person eating meals?: A  Little Help from another person taking care of personal grooming?: A Little Help from another person toileting, which includes using toliet, bedpan, or urinal?: A Lot Help from another person bathing (including washing, rinsing, drying)?: A Lot Help from another person to put on and taking off regular upper body clothing?: A Little Help from another person to put on and taking off regular lower body clothing?: A Lot 6 Click Score: 15   End of Session Equipment Utilized During Treatment: Right knee immobilizer;Rolling walker (2 wheels) Nurse Communication: Mobility status  Activity Tolerance: Patient tolerated treatment well Patient left: in bed;with call bell/phone within reach;with family/visitor present;with bed alarm set  OT Visit Diagnosis: Unsteadiness on feet (R26.81);Other abnormalities of gait and mobility (R26.89);Repeated falls (R29.6);Muscle weakness (generalized) (M62.81);Hemiplegia and hemiparesis;Pain Hemiplegia - Right/Left: Right Hemiplegia - dominant/non-dominant: Dominant Hemiplegia - caused by: Cerebral infarction Pain - Right/Left: Right Pain - part of body: Knee                Time: 1020-1054 OT Time Calculation (min): 34 min Charges:  OT General Charges $OT Visit: 1 Visit OT Evaluation $OT Eval Moderate Complexity: 1 Mod OT Treatments $Self Care/Home Management : 8-22 mins  Perrin Maltese, OTR/L Acute Rehabilitation Services  Office [phone] 08/12/2022

## 2022-08-12 NOTE — Evaluation (Signed)
Physical Therapy Evaluation Patient Details Name: Anthony Woods MRN: [id number] DOB: [birth year] Today's Date: 08/12/2022  History of Present Illness  The pt is a 67 yo male presenting 5/22 with increased confusion and frequent falls. MRI pending. PMH includes: anxiety, CAD, HTN, HLD, afib, and DM II.   Clinical Impression  Pt in bed upon arrival of PT, agreeable to evaluation at this time. Prior to last 3 weeks, pt was independent with all mobility and ADLs, but has had increased confusion, falls, and difficulty in last few weeks progressing to the point where his wife is unable to get him up. The pt now presents with limitations in functional mobility, awareness, coordination, stability, and activity tolerance, and will continue to benefit from skilled PT to address these deficits. The pt was eventually able to state month accurately and knew he was at the hospital, but required significant cues for sequencing tasks and safety with mobility. He was limited to standing at EOB due to inability to balance for stepping and poor coordination of steps along EOB despite significant assist. Given level of assist needed is greater than the pt's wife can currently provide, recommend continued rehab in inpatient setting to facilitate return to baseline mobility.         Recommendations for follow up therapy are one component of a multi-disciplinary discharge planning process, led by the attending physician.  Recommendations may be updated based on patient status, additional functional criteria and insurance authorization.  Follow Up Recommendations Can patient physically be transported by private vehicle: No     Assistance Recommended at Discharge Frequent or constant Supervision/Assistance  Patient can return home with the following  Two people to help with walking and/or transfers;A lot of help with bathing/dressing/bathroom;Assistance with cooking/housework;Direct supervision/assist for medications  management;Assistance with feeding;Direct supervision/assist for financial management;Assist for transportation;Help with stairs or ramp for entrance    Equipment Recommendations Other (comment) (defer to post acute)  Recommendations for Other Services  OT consult    Functional Status Assessment Patient has had a recent decline in their functional status and demonstrates the ability to make significant improvements in function in a reasonable and predictable amount of time.     Precautions / Restrictions Precautions Precautions: Fall Precaution Comments: pt with recent increase in falls in last 3 weeks, no longer able to get up Restrictions Weight Bearing Restrictions: No      Mobility  Bed Mobility Overal bed mobility: Needs Assistance Bed Mobility: Supine to Sit, Sit to Supine     Supine to sit: Min assist Sit to supine: Min assist   General bed mobility comments: minA with mod cues to complete movements to EOB and back. pt needing increased time to reposition and adjust    Transfers Overall transfer level: Needs assistance Equipment used: 1 person hand held assist Transfers: Sit to/from Stand Sit to Stand: Mod assist           General transfer comment: pt with posterior lean and reaching for UE support. pulling on therapist, falling in all directions with static stance. completed x2 pt reports dizziness initially    Ambulation/Gait         Gait velocity: decreased     General Gait Details: pt unable to steady for more than single step laterally along EOB. poor motor control to advance RLE    Balance Overall balance assessment: Needs assistance Sitting-balance support: No upper extremity supported, Feet supported Sitting balance-Leahy Scale: Poor Sitting balance - Comments: falling forwards with reaching to don  socks and falling backwards with LE movements while seated EOB Postural control: Posterior lean Standing balance support: Bilateral upper extremity  supported, During functional activity Standing balance-Leahy Scale: Zero Standing balance comment: dependent on therapist                             Pertinent Vitals/Pain Pain Assessment Pain Assessment: Faces Faces Pain Scale: Hurts little more Pain Location: head and neck when moving Pain Descriptors / Indicators: Grimacing, Headache Pain Intervention(s): Limited activity within patient's tolerance, Monitored during session, Repositioned    Home Living Family/patient expects to be discharged to:: Private residence Living Arrangements: Spouse/significant other Available Help at Discharge: Available 24 hours/day (wife unable to physically assist) Type of Home: Mobile home Home Access: Stairs to enter Entrance Stairs-Rails: Doctor, general practice of Steps: 4   Home Layout: One level Home Equipment: Agricultural consultant (2 wheels);Rollator (4 wheels);Cane - single point;Shower seat      Prior Function Prior Level of Function : Independent/Modified Independent;History of Falls (last six months)             Mobility Comments: until last 3 weeks, pt independent. but then had increase in falls. originally able to get up with wife's assist, but now is not able to get up even with her help. does use RW ADLs Comments: prior to last 3 weeks, pt was independent but now will only shower or dress when he feels like it     Hand Dominance   Dominant Hand: Right    Extremity/Trunk Assessment   Upper Extremity Assessment Upper Extremity Assessment: RUE deficits/detail;LUE deficits/detail RUE Deficits / Details: good strength to MMT, poor functional use increased time and effort to don socks RUE Coordination: decreased fine motor;decreased gross motor LUE Deficits / Details: good strength to MMT, poor functional use increased time and effort to don socks LUE Coordination: decreased fine motor    Lower Extremity Assessment Lower Extremity Assessment: Generalized  weakness;RLE deficits/detail;LLE deficits/detail RLE Deficits / Details: good strength to MMT, poor coordination and motor planning RLE Coordination: decreased fine motor;decreased gross motor LLE Deficits / Details: good strength to MMT, poor coordination and motor planning LLE Coordination: decreased fine motor;decreased gross motor    Cervical / Trunk Assessment Cervical / Trunk Assessment: Kyphotic  Communication   Communication: No difficulties  Cognition Arousal/Alertness: Awake/alert Behavior During Therapy: Flat affect Overall Cognitive Status: Impaired/Different from baseline Area of Impairment: Orientation, Attention, Memory, Following commands, Safety/judgement, Awareness, Problem solving                 Orientation Level: Disoriented to, Time (pt initially reporting "if you don't know the date I'm not going to tell you" but eventually stated month correctly as May) Current Attention Level: Focused (easily distracted) Memory: Decreased short-term memory Following Commands: Follows one step commands inconsistently, Follows one step commands with increased time Safety/Judgement: Decreased awareness of safety, Decreased awareness of deficits Awareness: Intellectual Problem Solving: Slow processing, Decreased initiation, Difficulty sequencing, Requires verbal cues General Comments: pt needing direct cueing for all        General Comments General comments (skin integrity, edema, etc.): wife present and supportive. abrasion on R forehead        Assessment/Plan    PT Assessment Patient needs continued PT services  PT Problem List Decreased strength;Decreased range of motion;Decreased activity tolerance;Decreased balance;Decreased mobility;Decreased coordination;Decreased cognition       PT Treatment Interventions DME instruction;Gait training;Functional mobility training;Therapeutic activities;Stair training;Therapeutic exercise;Balance  training;Patient/family  education    PT Goals (Current goals can be found in the Care Plan section)  Acute Rehab PT Goals Patient Stated Goal: to reduce frequency of falls PT Goal Formulation: With patient Time For Goal Achievement: 08/26/22 Potential to Achieve Goals: Good    Frequency Min 3X/week        AM-PAC PT "6 Clicks" Mobility  Outcome Measure Help needed turning from your back to your side while in a flat bed without using bedrails?: A Little Help needed moving from lying on your back to sitting on the side of a flat bed without using bedrails?: A Little Help needed moving to and from a bed to a chair (including a wheelchair)?: Total Help needed standing up from a chair using your arms (e.g., wheelchair or bedside chair)?: A Lot Help needed to walk in hospital room?: Total Help needed climbing 3-5 steps with a railing? : Total 6 Click Score: 11    End of Session Equipment Utilized During Treatment: Gait belt Activity Tolerance: Patient tolerated treatment well;Patient limited by fatigue Patient left: in bed;with call bell/phone within reach;with bed alarm set (with transport to CT) Nurse Communication: Mobility status PT Visit Diagnosis: Unsteadiness on feet (R26.81);Other abnormalities of gait and mobility (R26.89);Repeated falls (R29.6);Muscle weakness (generalized) (M62.81);Ataxic gait (R26.0)    Time: 1610-9604 PT Time Calculation (min) (ACUTE ONLY): 26 min   Charges:   PT Evaluation $PT Eval Moderate Complexity: 1 Mod PT Treatments $Therapeutic Exercise: 8-22 mins        Vickki Muff, PT, DPT   Acute Rehabilitation Department Office [phone] Secure Chat Communication Preferred  Ronnie Derby 08/12/2022, 10:02 AM

## 2022-08-12 NOTE — Consult Note (Signed)
Neurology Consultation  Reason for Consult: Stroke on MRI Referring Physician: Ophelia Charter  CC: frequent falls  History is obtained from: Patient, family   HPI: Anthony Woods is a 67 y.o. male with a past medical history of CAD, hypertension, hyperlipidemia, atrial fibrillation on Xarelto, diabetes, and hypothyroidism who initially presented on 5/21 after a fall.  He was then discharged as his wife wanted to take him home.  He returned on 5/22 as a level 2 trauma due to fall on thinners with increasing confusion as well as urinary symptoms.  Additionally it is noted that he has had slurred speech, intermittent weakness on the right side, as well as fluctuating confusion over the last 3 weeks. His wife and sisters are at the bedside. They tell me that for a period of time at the beginning of the year he was not compliant with his medications but had started to take them regularly at the beginning of May. He states that he is not sure if he is taking his medications correctly. Since admission yesterday patient reports no change in his R sided   ROS: Full ROS was performed and is negative except as noted in the HPI.   Past Medical History:  Diagnosis Date   Allergy to bee sting    Anxiety    Arthritis    "right knee" (05/12/2016)   CAD (coronary artery disease)    a. DES to LCx 04/2016. b. DES to prox Cx 08/2017. c. Stable cath 09/2018 with mild nonobstructive residual disease, normal EF.   Headache    "when his sugar goes too high or too low" (05/12/2016)   HTN (hypertension)    Hyperlipidemia    Hypothyroidism    Macular degeneration    Obesity    PAF (paroxysmal atrial fibrillation) (HCC)    Type II diabetes mellitus (HCC)      Family History  Problem Relation Age of Onset   Diabetes Mother    Cancer Mother        male ca   Colon cancer Mother 38   Diabetes Father    Cancer Father        stomach   CVA Father    Stomach cancer Father    Breast cancer Sister    Diabetes Sister     CAD Maternal Grandmother    CVA Paternal Uncle    Esophageal cancer Neg Hx    Rectal cancer Neg Hx     Social History:   reports that he has been smoking cigarettes. He has a 126.00 pack-year smoking history. He has never used smokeless tobacco. He reports that he does not drink alcohol and does not use drugs.  Medications  Current Facility-Administered Medications:     stroke: early stages of recovery book, , Does not apply, Once, Jonah Blue, MD   acetaminophen (TYLENOL) tablet 650 mg, 650 mg, Oral, Q4H PRN **OR** acetaminophen (TYLENOL) 160 MG/5ML solution 650 mg, 650 mg, Per Tube, Q4H PRN **OR** acetaminophen (TYLENOL) suppository 650 mg, 650 mg, Rectal, Q4H PRN, Jonah Blue, MD   aspirin EC tablet 81 mg, 81 mg, Oral, Daily, Jonah Blue, MD, 81 mg at 08/12/22 0802   escitalopram (LEXAPRO) tablet 10 mg, 10 mg, Oral, Daily, Jonah Blue, MD, 10 mg at 08/12/22 6578   ezetimibe (ZETIA) tablet 10 mg, 10 mg, Oral, Daily, Jonah Blue, MD   insulin aspart (novoLOG) injection 0-15 Units, 0-15 Units, Subcutaneous, TID WC, Jonah Blue, MD   [START ON 08/13/2022] levothyroxine (SYNTHROID) tablet 175  mcg, 175 mcg, Oral, Q0600, Jonah Blue, MD   nicotine (NICODERM CQ - dosed in mg/24 hours) patch 21 mg, 21 mg, Transdermal, Daily, Jonah Blue, MD   rivaroxaban Carlena Hurl) tablet 20 mg, 20 mg, Oral, Daily, Jonah Blue, MD, 20 mg at 08/12/22 1610   rosuvastatin (CRESTOR) tablet 20 mg, 20 mg, Oral, Daily, Jonah Blue, MD, 20 mg at 08/12/22 9604   senna-docusate (Senokot-S) tablet 1 tablet, 1 tablet, Oral, QHS PRN, Jonah Blue, MD   Exam: Current vital signs: BP (!) 91/55   Pulse 89   Temp 98.4 F (36.9 C) (Oral)   Resp 12   Ht 5\' 5"  (1.651 m)   Wt 95 kg   SpO2 96%   BMI 34.85 kg/m  Vital signs in last 24 hours: Temp:  [97.6 F (36.4 C)-98.4 F (36.9 C)] 98.4 F (36.9 C) (05/23 1253) Pulse Rate:  [63-89] 89 (05/23 1253) Resp:  [12] 12 (05/23  1253) BP: (91-159)/(55-98) 91/55 (05/23 1253) SpO2:  [91 %-99 %] 96 % (05/23 1253)  GENERAL: Awake, alert in NAD HEENT: - Normocephalic and atraumatic, dry mm, no LN++, no Thyromegally LUNGS - Clear to auscultation bilaterally with no wheezes CV - S1S2 RRR, no m/r/g, equal pulses bilaterally. ABDOMEN - Soft, nontender, nondistended with normoactive BS Ext: warm, well perfused, intact peripheral pulses, no edema  NEURO:  Mental Status: Alert and oriented to self, time, and location. States he is at [employer]. Does not remember why he is in the hospital. Language: speech is dysarthric.  Naming, repetition, fluency, and comprehension intact. Cranial Nerves: PERRL. EOMI, visual fields full, no facial asymmetry, facial sensation intact, hearing intact, tongue/uvula/soft palate midline, normal sternocleidomastoid and trapezius muscle strength. No evidence of tongue atrophy or fasciculations Motor:  RUE 4/5 with drift LUE 5/5 RLE 4/5 with drift LLE 5/5 Tone: is normal and bulk is normal Sensation- Intact to light touch bilaterally Coordination: Ataxia noted with FNF and HKS bilaterally but out of proportion to weakness on the right as well. RAM slowed bilaterally Gait- deferred  1a Level of Conscious.: 0 1b LOC Questions: 0 1c LOC Commands: 0 2 Best Gaze: 0 3 Visual: 0 4 Facial Palsy: 0 5a Motor Arm - left: 0 5b Motor Arm - Right: 1 6a Motor Leg - Left: 0 6b Motor Leg - Right: 1 7 Limb Ataxia: 2 8 Sensory: 0 9 Best Language: 0 10 Dysarthria: 1 11 Extinct. and Inatten.: 0 TOTAL: 5   Labs I have reviewed labs in epic and the results pertinent to this consultation are:  CBC    Component Value Date/Time   WBC 8.3 08/11/2022 1417   RBC 5.25 08/11/2022 1417   HGB 16.3 08/11/2022 1432   HGB 17.4 06/04/2020 1002   HCT 48.0 08/11/2022 1432   HCT 49.9 06/04/2020 1002   PLT 232 08/11/2022 1417   PLT 233 06/04/2020 1002   MCV 90.5 08/11/2022 1417   MCV 92 06/04/2020 1002   MCH 31.2  08/11/2022 1417   MCHC 34.5 08/11/2022 1417   RDW 12.3 08/11/2022 1417   RDW 12.6 06/04/2020 1002   LYMPHSABS 1.7 08/11/2022 1417   MONOABS 0.6 08/11/2022 1417   EOSABS 0.1 08/11/2022 1417   BASOSABS 0.1 08/11/2022 1417    CMP     Component Value Date/Time   NA 135 08/12/2022 1106   NA 138 06/04/2020 1002   K 3.9 08/12/2022 1106   CL 98 08/12/2022 1106   CO2 22 08/12/2022 1106   GLUCOSE 316 (H) 08/12/2022  1106   GLUCOSE 156 (H) 02/08/2006 0757   BUN 17 08/12/2022 1106   BUN 14 06/04/2020 1002   CREATININE 1.14 08/12/2022 1106   CALCIUM 10.5 (H) 08/12/2022 1106   PROT 6.7 08/11/2022 1417   PROT 6.7 06/04/2020 1002   ALBUMIN 3.8 08/11/2022 1417   ALBUMIN 4.3 06/04/2020 1002   AST 18 08/11/2022 1417   ALT 16 08/11/2022 1417   ALKPHOS 97 08/11/2022 1417   BILITOT 1.2 08/11/2022 1417   BILITOT 0.6 06/04/2020 1002   GFRNONAA >60 08/12/2022 1106   GFRAA 96 09/25/2018 0801    Lipid Panel     Component Value Date/Time   CHOL 129 09/04/2020 0852   TRIG 133 09/04/2020 0852   TRIG 219 (HH) 02/08/2006 0757   HDL 47 09/04/2020 0852   CHOLHDL 2.7 09/04/2020 0852   CHOLHDL 4 01/10/2020 0822   VLDL 28.4 01/10/2020 0822   LDLCALC 59 09/04/2020 0852   LDLDIRECT 100.0 12/09/2014 0827     Imaging I have reviewed the images obtained:  CT-head- No acute intracranial pathology. Small-vessel white matter disease.  CT Angio Head and Neck-  Focal occlusion versus severe/critical stenosis of the left vertebral artery origin with more distal opacification. Moderate right vertebral artery origin stenosis. Approximately 40% stenosis of the right ICA origin in the neck. Aortic Atherosclerosis (ICD10-I70.0).  MRI examination of the brain- Acute infarcts within the left aspect of the pons, bilateral thalami and bilateral parietal lobes. The largest infarct is located within the left aspect of the pons, measuring 16 x 8 mm. Additionally, there is a 3 mm acute/early subacute infarct  within the mid-to-posterior left frontal lobe white matter. Involvement of multiple vascular territories, suspicious for an embolic process.  Assessment:  67 y.o. male presenting with past medical history of CAD, hypertension, hyperlipidemia, atrial fibrillation on Xarelto, diabetes, and hypothyroidism who initially presented on 5/21 after a fall.  He was then discharged as his wife wanted to take him home.  He returned on 5/22 as a level 2 trauma due to fall on thinners with increasing confusion as well as urinary symptoms.  Additionally it is noted that he has had slurred speech, intermittent weakness on the right side, as well as fluctuating confusion over the last 3 weeks. It is difficult to determine when his sx began as he is a very poor historian but it is likely that it was sometime before 5/21 when he first presented to ED. Therefore he is outside the window for permissive HTN. Based on the facts that his stroke is likely a few days old, his strokes are small, and the etiology is embolic I recommend continuing his xarelto. The risk of holding it with respect to further embolic events is felt to be higher than the risk of hemorrhagic conversion if it is continued. He will also need to remain on ASA 81mg  daily 2/2 hx coronary stent.  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 MD to update note as needed.   Elmer Picker, DNP, FNP-BC [facility] Pager: [phone]   Attending  Neurohospitalist  Addendum Patient seen and examined with APP/Resident. Agree with the history and physical as documented above. Agree with the plan as documented, which I helped formulate. I have edited the note above to reflect my full findings and recommendations. I have independently reviewed the chart, obtained history, review of systems and examined the patient.I have personally reviewed pertinent head/neck/spine imaging (CT/MRI). Please feel free to call with any questions.  -- Bing Neighbors, MD [facility] [phone]  If 7pm- 7am, please page neurology on call as listed in [facility].

## 2022-08-12 NOTE — Progress Notes (Signed)
MD updated, HS CBG 355, no S/S coverage noted. SRP, RN

## 2022-08-12 NOTE — ED Notes (Signed)
Pt attempting to exit bed. Pt repositioned in bed.

## 2022-08-12 NOTE — PMR Pre-admission (Signed)
PMR Admission Coordinator Pre-Admission Assessment  Patient: Anthony Woods is an 67 y.o., male MRN: [id number] DOB: 12-Jul-1955 Height: 5\' 5"  (165.1 cm) Weight: 95 kg  Insurance Information HMO:     PPO:      PCP:      IPA:      80/20:     OTHER:  PRIMARY: Medicare a and b      Policy#: [id number]      Subscriber: pt Benefits:passport one source online     Name: 5/23 Eff. Date: a 08/20/21 and b 03/22/22     Deduct: $1632      Out of Pocket Max: none      Life Max: none CIR: 100%      SNF: 20 full days Outpatient: 80%     Co-Pay: 20% Home Health: 100%      Co-Pay: none DME: 80%     Co-Pay: 20% Providers: pt choice  SECONDARY: none  Financial Counselor:       Phone#:   The Engineer, materials Information Summary" for patients in Inpatient Rehabilitation Facilities with attached "Privacy Act Statement-Health Care Records" was provided and verbally reviewed with: Patient and Family  Emergency Contact Information Contact Information     Name Relation Home Work Mobile   Kassing,Debra Spouse  [phone]   Laneta Simmers   [phone]   Bagsby,Carol Sister   [phone]      Current Medical History  Patient Admitting Diagnosis: CVA  History of Present Illness: 67 year old male with history of CAD s/p stent, HTN, HLD, Afib on Xarelto, DM, and hypothyroidism who presented to [facility] ED on 5/21 with a fall, and complaints of dizziness. Imaging showed a possible medical epicondyle fracture bu CT negative. Wife requested to go home. He returned to ED on 5/22 with increased confusion and falls as well as urinary symptoms. Symptoms resolved but his wife felt too weak to go home. Right sided weakness and slurred speech over 3 weeks reportedly.Not taking his meds correctly over past few months.  CT Angio Head and Neck-  Focal occlusion versus severe/critical stenosis of the left vertebral artery origin with more distal opacification. Moderate right vertebral artery origin  stenosis. Approximately 40% stenosis of the right ICA origin in the neck. Aortic Atherosclerosis.  MRI examination of the brain- Acute infarcts within the left aspect of the pons, bilateral thalami and bilateral parietal lobes. The largest infarct is located within the left aspect of the pons, measuring 16 x 8 mm. Additionally, there is a 3 mm acute/early subacute infarct within the mid-to-posterior left frontal lobe white matter. Involvement of multiple vascular territories, suspicious for an embolic process. Outside window for permissive HTN. Neurology consulted and patient admitted 5/23.  Felt embolic due to afib and noncompliant with Xarelto. LDL 128. Continue on Crestor and Zetia. Echo pending. Prior to admit patient on atenolol, HCTZ, amlodipine and losartan as well as Imdur. Permissive HTN being allowed. Left foot pain with concern for nondisplaced fracture involving the proximal phalanx of the fifth digit. Postop shoe and 4th and 5th toes taped together. Metformin on hold. Hgb A1c pending. Levothyroxine resumed. TSH 29.6.   Complete NIHSS TOTAL: 4  Patient's medical record from [facility] has been reviewed by the rehabilitation admission coordinator and physician.  Past Medical History  Past Medical History:  Diagnosis Date   Allergy to bee sting    Anxiety    Arthritis    "right knee" (05/12/2016)   CAD (coronary artery disease)  a. DES to LCx 04/2016. b. DES to prox Cx 08/2017. c. Stable cath 09/2018 with mild nonobstructive residual disease, normal EF.   Headache    "when his sugar goes too high or too low" (05/12/2016)   HTN (hypertension)    Hyperlipidemia    Hypothyroidism    Macular degeneration    Obesity    PAF (paroxysmal atrial fibrillation) (HCC)    Type II diabetes mellitus (HCC)    Has the patient had major surgery during 100 days prior to admission? No  Family History   family history includes Breast cancer in his sister; CAD in his maternal grandmother;  CVA in his father and paternal uncle; Cancer in his father and mother; Colon cancer (age of onset: 73) in his mother; Diabetes in his father, mother, and sister; Stomach cancer in his father.  Current Medications  Current Facility-Administered Medications:    acetaminophen (TYLENOL) tablet 650 mg, 650 mg, Oral, Q4H PRN **OR** acetaminophen (TYLENOL) 160 MG/5ML solution 650 mg, 650 mg, Per Tube, Q4H PRN **OR** acetaminophen (TYLENOL) suppository 650 mg, 650 mg, Rectal, Q4H PRN, Jonah Blue, MD   aspirin EC tablet 81 mg, 81 mg, Oral, Daily, Jonah Blue, MD, 81 mg at 08/13/22 0840   [START ON 08/14/2022] atenolol (TENORMIN) tablet 50 mg, 50 mg, Oral, Daily, Osvaldo Shipper, MD   escitalopram (LEXAPRO) tablet 10 mg, 10 mg, Oral, Daily, Jonah Blue, MD, 10 mg at 08/13/22 1610   ezetimibe (ZETIA) tablet 10 mg, 10 mg, Oral, Daily, Jonah Blue, MD, 10 mg at 08/13/22 0839   insulin aspart (novoLOG) injection 0-15 Units, 0-15 Units, Subcutaneous, TID WC & HS, Carollee Herter, DO, 3 Units at 08/13/22 9604   levothyroxine (SYNTHROID) tablet 175 mcg, 175 mcg, Oral, Q0600, Jonah Blue, MD, 175 mcg at 08/13/22 5409   nicotine (NICODERM CQ - dosed in mg/24 hours) patch 21 mg, 21 mg, Transdermal, Daily, Jonah Blue, MD, 21 mg at 08/13/22 0841   rivaroxaban (XARELTO) tablet 20 mg, 20 mg, Oral, Daily, Jonah Blue, MD, 20 mg at 08/13/22 0839   rosuvastatin (CRESTOR) tablet 20 mg, 20 mg, Oral, Daily, Jonah Blue, MD, 20 mg at 08/13/22 8119   senna-docusate (Senokot-S) tablet 1 tablet, 1 tablet, Oral, QHS PRN, Jonah Blue, MD  Patients Current Diet:  Diet Order             DIET DYS 3 Fluid consistency: Thin  Diet effective now                 MBS completed 5/24  Precautions / Restrictions Precautions Precautions: Fall Precaution Comments: pt with recent increase in falls in last 3 weeks, no longer able to get up Restrictions Weight Bearing Restrictions: No   Has the  patient had 2 or more falls or a fall with injury in the past year? 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 Independent until 3 weeks ago  Prior Functional Level Self Care: Did the patient need help bathing, dressing, using the toilet or eating? Independent  Indoor Mobility: Did the patient need assistance with walking from room to room (with or without device)? Independent  Stairs: Did the patient need assistance with internal or external stairs (with or without device)? Independent  Functional Cognition: Did the patient need help planning regular tasks such as shopping or remembering to take medications? Independent  Patient Information Are you of Hispanic, Latino/a,or Spanish origin?: A. No, not of Hispanic, Latino/a, or Spanish origin What is your  race?: A. White Do you need or want an interpreter to communicate with a doctor or health care staff?: 0. No  Patient's Response To:  Health Literacy and Transportation Is the patient able to respond to health literacy and transportation needs?: Yes In the past 12 months, has lack of transportation kept you from medical appointments or from getting medications?: No In the past 12 months, has lack of transportation kept you from meetings, work, or from getting things needed for daily living?: No  Home Assistive Devices / Equipment Home Equipment: Agricultural consultant (2 wheels), Rollator (4 wheels), [employer] - single point, Information systems manager  Prior Device Use: Indicate devices/aids used by the patient prior to current illness, exacerbation or injury? None of the above  Current Functional Level Cognition  Overall Cognitive Status: Impaired/Different from baseline Current Attention Level: Sustained Orientation Level: Oriented to person, Disoriented to place, Disoriented to time, Disoriented to situation Following Commands: Follows one step commands with increased time, Follows multi-step commands with  increased time Safety/Judgement: Decreased awareness of deficits General Comments: pt needing direct cueing for all    Extremity Assessment (includes Sensation/Coordination)  Upper Extremity Assessment: RUE deficits/detail RUE Deficits / Details: Strength 4/5 throughout.  Slight ataxia noted with movements and functional use, dropping of sock when attempting to donn with slower opposition to thumb will all digits noted. RUE Sensation: decreased light touch (decreased light touch noted in the hand) RUE Coordination: decreased fine motor, decreased gross motor LUE Deficits / Details: strength WFLs. slightly slower finger to nose testing LUE Coordination: decreased fine motor  Lower Extremity Assessment: Defer to PT evaluation RLE Deficits / Details: good strength to MMT, poor coordination and motor planning RLE Coordination: decreased fine motor, decreased gross motor LLE Deficits / Details: good strength to MMT, poor coordination and motor planning LLE Coordination: decreased fine motor, decreased gross motor    ADLs  Overall ADL's : Needs assistance/impaired Eating/Feeding: Set up, Sitting Eating/Feeding Details (indicate cue type and reason): simulated Grooming: Sitting, Wash/dry hands, Wash/dry face Grooming Details (indicate cue type and reason): simulated Upper Body Bathing: Set up, Sitting Upper Body Bathing Details (indicate cue type and reason): simulated Lower Body Bathing: Maximal assistance, Sit to/from stand Lower Body Bathing Details (indicate cue type and reason): simulated Lower Body Dressing: Maximal assistance, Sit to/from stand Toilet Transfer: Maximal assistance, Ambulation, Rolling walker (2 wheels) Toilet Transfer Details (indicate cue type and reason): simulated Toileting- Clothing Manipulation and Hygiene: Maximal assistance, Sit to/from stand Toileting - Clothing Manipulation Details (indicate cue type and reason): simulated Functional mobility during ADLs:  Maximal assistance, Rolling walker (2 wheels) (ambulate forward and backwards a few steps) General ADL Comments: Pt with increased ataxia noted in the RUE with functional use.  LOB to the right in static sitting without UE support as well as posteriorly when attempting to donn his gripper socks.  Decreased ability to advance his LEs efficiently and weightshift to the right for stepping forward with the LUE.    Mobility  Overal bed mobility: Needs Assistance Bed Mobility: Supine to Sit, Sit to Supine Supine to sit: Min assist Sit to supine: Min assist General bed mobility comments: Min assist with increased time and use of the bed rails to sit up.  Min assist for scooting out to the edge of the bed.  Assist needed to lift LEs back in the bed.    Transfers  Overall transfer level: Needs assistance Equipment used: Rolling walker (2 wheels) Transfers: Sit to/from Stand, Bed to chair/wheelchair/BSC Sit  to Stand: Mod assist Bed to/from chair/wheelchair/BSC transfer type:: Step pivot Step pivot transfers: Max assist General transfer comment: Decreased efficiency with advancing the LLE with stepping.  Ataxia noted as well    Ambulation / Gait / Stairs / Wheelchair Mobility  Ambulation/Gait General Gait Details: pt unable to steady for more than single step laterally along EOB. poor motor control to advance RLE Gait velocity: decreased    Posture / Balance Dynamic Sitting Balance Sitting balance - Comments: posterior and right LOB in sitting Balance Overall balance assessment: Needs assistance Sitting-balance support: No upper extremity supported, Feet supported Sitting balance-Leahy Scale: Poor Sitting balance - Comments: posterior and right LOB in sitting Postural control: Posterior lean Standing balance support: Bilateral upper extremity supported, During functional activity Standing balance-Leahy Scale: Zero Standing balance comment: max assist for standing balance with need for  assistive device and therapist assist for mobility    Special needs/care consideration Fall precautions Noncompliant with meds prior to admit Hgb A1c pending Smoker DNR on acute hospital   Previous Home Environment  Living Arrangements: Spouse/significant other  Lives With: Spouse Available Help at Discharge: Available 24 hours/day Type of Home: Mobile home Home Layout: One level Home Access: Stairs to enter Entrance Stairs-Rails: Right, Left Entrance Stairs-Number of Steps: 4 Bathroom Shower/Tub: Health visitor: Standard Bathroom Accessibility: Yes How Accessible: Accessible via walker Home Care Services: No  Discharge Living Setting Plans for Discharge Living Setting: Patient's home, Lives with (comment), Mobile Home (wife) Type of Home at Discharge: Mobile home Discharge Home Layout: One level Discharge Home Access: Stairs to enter Entrance Stairs-Rails: Right, Left Entrance Stairs-Number of Steps: 4 Discharge Bathroom Shower/Tub: Walk-in shower Discharge Bathroom Toilet: Standard Discharge Bathroom Accessibility: Yes How Accessible: Accessible via walker Does the patient have any problems obtaining your medications?: No  Social/Family/Support Systems Patient Roles: Spouse Contact Information: wife, Stanton Kidney Anticipated Caregiver: wife Anticipated Caregiver's Contact Information: see contacts Ability/Limitations of Caregiver: none Caregiver Availability: 24/7 Discharge Plan Discussed with Primary Caregiver: Yes Is Caregiver In Agreement with Plan?: Yes Does Caregiver/Family have Issues with Lodging/Transportation while Pt is in Rehab?: No Goals Patient/Family Goal for Rehab: supervision PT, OT and SLP Expected length of stay: ELOS 10 to 14 days Pt/Family Agrees to Admission and willing to participate: Yes Program Orientation Provided & Reviewed with Pt/Caregiver Including Roles  & Responsibilities: Yes  Decrease burden of Care through IP rehab  admission: n/a  Possible need for SNF placement upon discharge: not anticipated  Patient Condition: I have reviewed medical records from [facility], spoken with CM, and patient and spouse. I met with patient at the bedside for inpatient rehabilitation assessment.  Patient will benefit from ongoing PT, OT, and SLP, can actively participate in 3 hours of therapy a day 5 days of the week, and can make measurable gains during the admission.  Patient will also benefit from the coordinated team approach during an Inpatient Acute Rehabilitation admission.  The patient will receive intensive therapy as well as Rehabilitation physician, nursing, social worker, and care management interventions.  Due to bladder management, bowel management, safety, skin/wound care, disease management, medication administration, pain management, and patient education the patient requires 24 hour a day rehabilitation nursing.  The patient is currently mod assist overall with mobility and basic ADLs.  Discharge setting and therapy post discharge at home with home health is anticipated.  Patient has agreed to participate in the Acute Inpatient Rehabilitation Program and will admit today.  Preadmission Screen Completed By:  Beckie Salts,  Foye Spurling, RN MSN 08/13/2022 10:47 AM ______________________________________________________________________   Discussed status with Dr. Shearon Stalls on 08/13/22 at 1049 and received approval for admission today.  Admission Coordinator:  Clois Dupes, RN MSN time 1047 Date 08/13/22   Assessment/Plan: Diagnosis: Does the need for close, 24 hr/day Medical supervision in concert with the patient's rehab needs make it unreasonable for this patient to be served in a less intensive setting? Yes Co-Morbidities requiring supervision/potential complications: A fib, hypertension, L foot 5th digit fracture, uncontrolled diabetes, hypothyroidism, dysphagia, bowel and bladder incontinence Due to  bladder management, bowel management, safety, skin/wound care, disease management, medication administration, pain management, and patient education, does the patient require 24 hr/day rehab nursing? Yes Does the patient require coordinated care of a physician, rehab nurse, PT, OT, and SLP to address physical and functional deficits in the context of the above medical diagnosis(es)? Yes Addressing deficits in the following areas: balance, endurance, locomotion, strength, transferring, bowel/bladder control, bathing, dressing, feeding, grooming, toileting, cognition, speech, language, and swallowing Can the patient actively participate in an intensive therapy program of at least 3 hrs of therapy 5 days a week? Yes The potential for patient to make measurable gains while on inpatient rehab is good Anticipated functional outcomes upon discharge from inpatient rehab: supervision PT, supervision OT, supervision SLP Estimated rehab length of stay to reach the above functional goals is: 10-14 days Anticipated discharge destination: Home 10. Overall Rehab/Functional Prognosis: good   MD Signature:  Angelina Sheriff, DO 08/13/2022

## 2022-08-13 ENCOUNTER — Other Ambulatory Visit (HOSPITAL_COMMUNITY): Payer: Self-pay

## 2022-08-13 ENCOUNTER — Encounter (HOSPITAL_COMMUNITY): Payer: Self-pay | Admitting: Physical Medicine and Rehabilitation

## 2022-08-13 ENCOUNTER — Inpatient Hospital Stay (HOSPITAL_COMMUNITY): Payer: Medicare Other

## 2022-08-13 ENCOUNTER — Other Ambulatory Visit: Payer: Self-pay

## 2022-08-13 ENCOUNTER — Inpatient Hospital Stay (HOSPITAL_COMMUNITY)
Admission: RE | Admit: 2022-08-13 | Discharge: 2022-09-03 | DRG: 057 | Disposition: A | Discharge status: Home Health | Payer: Medicare Other | Source: Intra-hospital | Attending: Physical Medicine and Rehabilitation | Admitting: Physical Medicine and Rehabilitation

## 2022-08-13 DIAGNOSIS — Z888 Allergy status to other drugs, medicaments and biological substances status: Secondary | ICD-10-CM

## 2022-08-13 DIAGNOSIS — R000 Tachycardia, unspecified: Secondary | ICD-10-CM | POA: Diagnosis not present

## 2022-08-13 DIAGNOSIS — J449 Chronic obstructive pulmonary disease, unspecified: Secondary | ICD-10-CM | POA: Diagnosis present

## 2022-08-13 DIAGNOSIS — Z79890 Hormone replacement therapy (postmenopausal): Secondary | ICD-10-CM

## 2022-08-13 DIAGNOSIS — E1165 Type 2 diabetes mellitus with hyperglycemia: Secondary | ICD-10-CM | POA: Diagnosis present

## 2022-08-13 DIAGNOSIS — I10 Essential (primary) hypertension: Secondary | ICD-10-CM | POA: Diagnosis present

## 2022-08-13 DIAGNOSIS — I6389 Other cerebral infarction: Secondary | ICD-10-CM

## 2022-08-13 DIAGNOSIS — R42 Dizziness and giddiness: Secondary | ICD-10-CM | POA: Diagnosis not present

## 2022-08-13 DIAGNOSIS — R451 Restlessness and agitation: Secondary | ICD-10-CM | POA: Diagnosis not present

## 2022-08-13 DIAGNOSIS — Z7985 Long-term (current) use of injectable non-insulin antidiabetic drugs: Secondary | ICD-10-CM | POA: Diagnosis not present

## 2022-08-13 DIAGNOSIS — I69319 Unspecified symptoms and signs involving cognitive functions following cerebral infarction: Secondary | ICD-10-CM

## 2022-08-13 DIAGNOSIS — M11261 Other chondrocalcinosis, right knee: Secondary | ICD-10-CM | POA: Diagnosis present

## 2022-08-13 DIAGNOSIS — Z85828 Personal history of other malignant neoplasm of skin: Secondary | ICD-10-CM

## 2022-08-13 DIAGNOSIS — F0670 Mild neurocognitive disorder due to known physiological condition without behavioral disturbance: Secondary | ICD-10-CM | POA: Diagnosis not present

## 2022-08-13 DIAGNOSIS — R001 Bradycardia, unspecified: Secondary | ICD-10-CM | POA: Diagnosis present

## 2022-08-13 DIAGNOSIS — E785 Hyperlipidemia, unspecified: Secondary | ICD-10-CM | POA: Diagnosis present

## 2022-08-13 DIAGNOSIS — I2510 Atherosclerotic heart disease of native coronary artery without angina pectoris: Secondary | ICD-10-CM | POA: Diagnosis present

## 2022-08-13 DIAGNOSIS — R296 Repeated falls: Secondary | ICD-10-CM | POA: Diagnosis present

## 2022-08-13 DIAGNOSIS — G8929 Other chronic pain: Secondary | ICD-10-CM | POA: Diagnosis present

## 2022-08-13 DIAGNOSIS — Z833 Family history of diabetes mellitus: Secondary | ICD-10-CM

## 2022-08-13 DIAGNOSIS — I639 Cerebral infarction, unspecified: Secondary | ICD-10-CM | POA: Diagnosis not present

## 2022-08-13 DIAGNOSIS — Z7901 Long term (current) use of anticoagulants: Secondary | ICD-10-CM | POA: Diagnosis not present

## 2022-08-13 DIAGNOSIS — M1990 Unspecified osteoarthritis, unspecified site: Secondary | ICD-10-CM | POA: Diagnosis present

## 2022-08-13 DIAGNOSIS — Z91148 Patient's other noncompliance with medication regimen for other reason: Secondary | ICD-10-CM

## 2022-08-13 DIAGNOSIS — E039 Hypothyroidism, unspecified: Secondary | ICD-10-CM | POA: Diagnosis present

## 2022-08-13 DIAGNOSIS — I480 Paroxysmal atrial fibrillation: Secondary | ICD-10-CM | POA: Diagnosis present

## 2022-08-13 DIAGNOSIS — F17210 Nicotine dependence, cigarettes, uncomplicated: Secondary | ICD-10-CM | POA: Diagnosis present

## 2022-08-13 DIAGNOSIS — E663 Overweight: Secondary | ICD-10-CM | POA: Diagnosis present

## 2022-08-13 DIAGNOSIS — Z7984 Long term (current) use of oral hypoglycemic drugs: Secondary | ICD-10-CM | POA: Diagnosis not present

## 2022-08-13 DIAGNOSIS — Z7982 Long term (current) use of aspirin: Secondary | ICD-10-CM

## 2022-08-13 DIAGNOSIS — Z823 Family history of stroke: Secondary | ICD-10-CM

## 2022-08-13 DIAGNOSIS — G4700 Insomnia, unspecified: Secondary | ICD-10-CM | POA: Diagnosis present

## 2022-08-13 DIAGNOSIS — Z6827 Body mass index (BMI) 27.0-27.9, adult: Secondary | ICD-10-CM

## 2022-08-13 DIAGNOSIS — Z800 Family history of malignant neoplasm of digestive organs: Secondary | ICD-10-CM | POA: Diagnosis not present

## 2022-08-13 DIAGNOSIS — M25461 Effusion, right knee: Secondary | ICD-10-CM | POA: Diagnosis present

## 2022-08-13 DIAGNOSIS — Z955 Presence of coronary angioplasty implant and graft: Secondary | ICD-10-CM | POA: Diagnosis not present

## 2022-08-13 DIAGNOSIS — Z91030 Bee allergy status: Secondary | ICD-10-CM

## 2022-08-13 DIAGNOSIS — F419 Anxiety disorder, unspecified: Secondary | ICD-10-CM | POA: Diagnosis present

## 2022-08-13 DIAGNOSIS — I69320 Aphasia following cerebral infarction: Secondary | ICD-10-CM

## 2022-08-13 DIAGNOSIS — E876 Hypokalemia: Secondary | ICD-10-CM | POA: Diagnosis present

## 2022-08-13 DIAGNOSIS — Z8249 Family history of ischemic heart disease and other diseases of the circulatory system: Secondary | ICD-10-CM | POA: Diagnosis not present

## 2022-08-13 DIAGNOSIS — M25561 Pain in right knee: Secondary | ICD-10-CM | POA: Diagnosis present

## 2022-08-13 DIAGNOSIS — Z79899 Other long term (current) drug therapy: Secondary | ICD-10-CM | POA: Diagnosis not present

## 2022-08-13 DIAGNOSIS — M1711 Unilateral primary osteoarthritis, right knee: Secondary | ICD-10-CM | POA: Diagnosis present

## 2022-08-13 DIAGNOSIS — H3530 Unspecified macular degeneration: Secondary | ICD-10-CM | POA: Diagnosis present

## 2022-08-13 DIAGNOSIS — I69398 Other sequelae of cerebral infarction: Principal | ICD-10-CM

## 2022-08-13 DIAGNOSIS — I69393 Ataxia following cerebral infarction: Secondary | ICD-10-CM

## 2022-08-13 DIAGNOSIS — Z803 Family history of malignant neoplasm of breast: Secondary | ICD-10-CM | POA: Diagnosis not present

## 2022-08-13 DIAGNOSIS — E119 Type 2 diabetes mellitus without complications: Secondary | ICD-10-CM | POA: Diagnosis not present

## 2022-08-13 DIAGNOSIS — Z66 Do not resuscitate: Secondary | ICD-10-CM | POA: Diagnosis present

## 2022-08-13 DIAGNOSIS — M79672 Pain in left foot: Secondary | ICD-10-CM | POA: Diagnosis present

## 2022-08-13 HISTORY — DX: Cerebral infarction, unspecified: I63.9

## 2022-08-13 LAB — ECHOCARDIOGRAM COMPLETE
Area-P 1/2: 2.87 cm2
Height: 65 in
S' Lateral: 3.1 cm
Weight: 3350.99 oz

## 2022-08-13 LAB — LIPID PANEL
Cholesterol: 197 mg/dL (ref 0–200)
HDL: 47 mg/dL (ref >40)
LDL Cholesterol: 128 mg/dL — ABNORMAL HIGH (ref 0–99)
Total CHOL/HDL Ratio: 4.2 RATIO
Triglycerides: 109 mg/dL (ref <150)
VLDL: 22 mg/dL (ref 0–40)

## 2022-08-13 LAB — HEMOGLOBIN A1C
Hgb A1c MFr Bld: >15.5 % — ABNORMAL HIGH (ref 4.8–5.6)
Mean Plasma Glucose: >398 mg/dL

## 2022-08-13 LAB — GLUCOSE, CAPILLARY
Glucose-Capillary: 189 mg/dL — ABNORMAL HIGH (ref 70–99)
Glucose-Capillary: 197 mg/dL — ABNORMAL HIGH (ref 70–99)
Glucose-Capillary: 233 mg/dL — ABNORMAL HIGH (ref 70–99)
Glucose-Capillary: 288 mg/dL — ABNORMAL HIGH (ref 70–99)

## 2022-08-13 MED ORDER — BLOOD PRESSURE CONTROL BOOK
Freq: Once | Status: AC
  Filled 2022-08-13: qty 1

## 2022-08-13 MED ORDER — APIXABAN 5 MG PO TABS: 5.0000 mg | ORAL_TABLET | Freq: Two times a day (BID) | ORAL | Status: DC

## 2022-08-13 MED ORDER — NICOTINE 21 MG/24HR TD PT24
21.0000 mg | MEDICATED_PATCH | Freq: Every day | TRANSDERMAL | Status: DC
  Administered 2022-08-14 – 2022-09-03 (×21): 21 mg via TRANSDERMAL
  Filled 2022-08-13 (×21): qty 1

## 2022-08-13 MED ORDER — LEVOTHYROXINE SODIUM 75 MCG PO TABS
175.0000 ug | ORAL_TABLET | Freq: Every day | ORAL | Status: DC
  Administered 2022-08-14 – 2022-09-03 (×20): 175 ug via ORAL
  Filled 2022-08-13 (×23): qty 1

## 2022-08-13 MED ORDER — EZETIMIBE 10 MG PO TABS
10.0000 mg | ORAL_TABLET | Freq: Every day | ORAL | Status: DC
  Administered 2022-08-14 – 2022-09-03 (×21): 10 mg via ORAL
  Filled 2022-08-13 (×21): qty 1

## 2022-08-13 MED ORDER — ROSUVASTATIN CALCIUM 20 MG PO TABS: 40.0000 mg | ORAL_TABLET | Freq: Every day | ORAL | Status: DC

## 2022-08-13 MED ORDER — EXERCISE FOR HEART AND HEALTH BOOK
Freq: Once | Status: AC
  Filled 2022-08-13: qty 1

## 2022-08-13 MED ORDER — LIVING WELL WITH DIABETES BOOK
Freq: Once | Status: AC
  Filled 2022-08-13: qty 1

## 2022-08-13 MED ORDER — METHOCARBAMOL 500 MG PO TABS: 500.0000 mg | ORAL_TABLET | Freq: Four times a day (QID) | ORAL | Status: DC | PRN

## 2022-08-13 MED ORDER — ONDANSETRON HCL 4 MG/2ML IJ SOLN: 4.0000 mg | Freq: Four times a day (QID) | INTRAMUSCULAR | Status: DC | PRN

## 2022-08-13 MED ORDER — TRAZODONE HCL 50 MG PO TABS
50.0000 mg | ORAL_TABLET | Freq: Every evening | ORAL | Status: DC | PRN
  Administered 2022-08-14 – 2022-08-17 (×4): 50 mg via ORAL
  Filled 2022-08-13 (×4): qty 1

## 2022-08-13 MED ORDER — ATENOLOL 25 MG PO TABS
50.0000 mg | ORAL_TABLET | Freq: Every day | ORAL | Status: DC
  Administered 2022-08-14 – 2022-08-16 (×3): 50 mg via ORAL
  Filled 2022-08-13 (×3): qty 2

## 2022-08-13 MED ORDER — ROSUVASTATIN CALCIUM 20 MG PO TABS: 20.0000 mg | ORAL_TABLET | Freq: Every day | ORAL | Status: DC

## 2022-08-13 MED ORDER — ATENOLOL-CHLORTHALIDONE 100-25 MG PO TABS: 1.0000 | ORAL_TABLET | Freq: Every day | ORAL | Status: DC

## 2022-08-13 MED ORDER — ONDANSETRON HCL 4 MG PO TABS: 4.0000 mg | ORAL_TABLET | Freq: Four times a day (QID) | ORAL | Status: DC | PRN

## 2022-08-13 MED ORDER — ESCITALOPRAM OXALATE 10 MG PO TABS
10.0000 mg | ORAL_TABLET | Freq: Every day | ORAL | Status: DC
  Administered 2022-08-14 – 2022-08-19 (×6): 10 mg via ORAL
  Filled 2022-08-13 (×6): qty 1

## 2022-08-13 MED ORDER — APIXABAN 5 MG PO TABS
5.0000 mg | ORAL_TABLET | Freq: Two times a day (BID) | ORAL | Status: DC
  Administered 2022-08-14 – 2022-09-03 (×41): 5 mg via ORAL
  Filled 2022-08-13 (×41): qty 1

## 2022-08-13 MED ORDER — ATENOLOL 25 MG PO TABS: 50.0000 mg | ORAL_TABLET | Freq: Every day | ORAL | Status: DC

## 2022-08-13 MED ORDER — INSULIN ASPART 100 UNIT/ML IJ SOLN
0.0000 [IU] | Freq: Three times a day (TID) | INTRAMUSCULAR | Status: DC
  Administered 2022-08-13 (×2): 5 [IU] via SUBCUTANEOUS
  Administered 2022-08-14 (×2): 3 [IU] via SUBCUTANEOUS
  Administered 2022-08-14: 5 [IU] via SUBCUTANEOUS
  Administered 2022-08-14 – 2022-08-15 (×2): 8 [IU] via SUBCUTANEOUS
  Administered 2022-08-15: 3 [IU] via SUBCUTANEOUS
  Administered 2022-08-15: 5 [IU] via SUBCUTANEOUS
  Administered 2022-08-15: 3 [IU] via SUBCUTANEOUS
  Administered 2022-08-16: 5 [IU] via SUBCUTANEOUS
  Administered 2022-08-16: 3 [IU] via SUBCUTANEOUS
  Administered 2022-08-16: 8 [IU] via SUBCUTANEOUS
  Administered 2022-08-17: 5 [IU] via SUBCUTANEOUS
  Administered 2022-08-17 – 2022-08-18 (×4): 3 [IU] via SUBCUTANEOUS
  Administered 2022-08-18 (×2): 5 [IU] via SUBCUTANEOUS
  Administered 2022-08-18: 2 [IU] via SUBCUTANEOUS
  Administered 2022-08-19: 3 [IU] via SUBCUTANEOUS
  Administered 2022-08-19: 5 [IU] via SUBCUTANEOUS
  Administered 2022-08-19 – 2022-08-20 (×4): 3 [IU] via SUBCUTANEOUS
  Administered 2022-08-20 – 2022-08-21 (×3): 5 [IU] via SUBCUTANEOUS
  Administered 2022-08-21: 8 [IU] via SUBCUTANEOUS
  Administered 2022-08-22: 5 [IU] via SUBCUTANEOUS
  Administered 2022-08-22: 8 [IU] via SUBCUTANEOUS
  Administered 2022-08-22: 2 [IU] via SUBCUTANEOUS
  Administered 2022-08-23 (×2): 3 [IU] via SUBCUTANEOUS
  Administered 2022-08-24 (×2): 2 [IU] via SUBCUTANEOUS
  Administered 2022-08-24: 3 [IU] via SUBCUTANEOUS

## 2022-08-13 MED ORDER — ROSUVASTATIN CALCIUM 20 MG PO TABS
20.0000 mg | ORAL_TABLET | Freq: Every day | ORAL | Status: DC
  Administered 2022-08-14 – 2022-09-03 (×21): 20 mg via ORAL
  Filled 2022-08-13 (×21): qty 1

## 2022-08-13 MED ORDER — ASPIRIN 81 MG PO TBEC
81.0000 mg | DELAYED_RELEASE_TABLET | Freq: Every day | ORAL | Status: DC
  Administered 2022-08-14 – 2022-09-03 (×21): 81 mg via ORAL
  Filled 2022-08-13 (×21): qty 1

## 2022-08-13 NOTE — Progress Notes (Addendum)
[facility] PROGRESS NOTE   Anthony Woods ZOX:[id number] DOB: Nov 07, 1955 DOA: 08/11/2022  PCP: Tresa Garter, MD  Brief History/Interval Summary: 67 y.o. male with medical history significant of CAD s/p stent; HTN; HLD; afib on Xarelto; DM; and hypothyroidism who presented on 5/21 with a fall, complaining of dizziness.  Imaging showed a possible medial epicondyle fracture but CT was negative as well as head and neck CTs.  His wife requested to take him home.  He returned on 5/22 with increased confusion and falls as well as urinary symptoms..  Patient was hospitalized for further management.   Consultants: Neurology  Procedures: Echocardiogram is pending    Subjective/Interval History: Patient somewhat of a poor historian.  Complains of right-sided weakness.  No nausea or vomiting.  No chest pain or shortness of breath.    Assessment/Plan:  Acute stroke Appears to be embolic stroke in the setting of known atrial fibrillation.  Apparently there has been medication noncompliance. Patient seen by neurology.  Currently on aspirin and rivaroxaban. Underwent CT angiogram head and neck.  Will defer to neurology. LDL is 128.  Patient noted to be on Zetia and Crestor. HbA1c is pending. Echocardiogram is pending. PT and OT evaluation.  Inpatient rehabilitation is recommended.  Chronic atrial fibrillation Compliance has been an issue.  Supposed to be on atenolol at home which is currently on hold. Rivaroxaban was resumed. Informed by pharmacy that Apixaban will be better covered by patient's insurance. Will change to apixaban.  Essential hypertension Permissive hypertension being allowed.  Prior to admission he was on atenolol, HCTZ, amlodipine and losartan.  Also noted to be on Imdur.  Hyperlipidemia Continue Crestor and Zetia.  Has likely been noncompliant because his LDL is noted to be 128.  Need to recheck lipid panel in a few weeks and adjust medications if levels  are still high.  Left foot pain with concern for nondisplaced fracture involving the proximal phalanx of the fifth digit. He underwent CT of the foot which raised concern for fracture.  Patient was placed on postop shoe and his fourth and fifth toes were taped together.  He will need repeat films in a few weeks.  Diabetes mellitus type 2, uncontrolled with hyperglycemia Metformin on hold.  SSI.  HbA1c is pending.  Hypothyroidism TSH is 29.6.  Likely noncompliant.  Levothyroxine was resumed.  Will need to recheck thyroid function tests in 2 to 3 weeks.  Tobacco dependence Counseling.  Obesity Estimated body mass index is 34.85 kg/m as calculated from the following:   Height as of this encounter: 5\' 5"  (1.651 m).   Weight as of this encounter: 95 kg.   DVT Prophylaxis: On rivaroxaban Code Status: DNR Family Communication: No family at bedside Disposition Plan: CIR when medically stable  Status is: Inpatient Remains inpatient appropriate because: Acute stroke      Medications: Scheduled:  aspirin EC  81 mg Oral Daily   escitalopram  10 mg Oral Daily   ezetimibe  10 mg Oral Daily   insulin aspart  0-15 Units Subcutaneous TID WC & HS   levothyroxine  175 mcg Oral Q0600   nicotine  21 mg Transdermal Daily   rivaroxaban  20 mg Oral Daily   rosuvastatin  20 mg Oral Daily   Continuous: WJX:[id number] **OR** acetaminophen (TYLENOL) oral liquid 160 mg/5 mL **OR** acetaminophen, senna-docusate  Antibiotics: Anti-infectives (From admission, onward)    None       Objective:  Vital Signs  Vitals:   08/12/22  1939 08/12/22 2318 08/13/22 0322 08/13/22 0723  BP: 111/66 126/74 124/70 115/75  Pulse: 75 76 66 69  Resp: 16 16 16 16   Temp: 98.1 F (36.7 C) 97.7 F (36.5 C) 97.7 F (36.5 C) 97.8 F (36.6 C)  TempSrc: Oral Oral Oral Oral  SpO2: 92% 93% 96% 94%  Weight:      Height:        Intake/Output Summary (Last 24 hours) at 08/13/2022 0916 Last data filed at  08/13/2022 0852 Gross per 24 hour  Intake 120 ml  Output 700 ml  Net -580 ml   Filed Weights   08/11/22 1430  Weight: 95 kg    General appearance: Awake alert.  In no distress Resp: Clear to auscultation bilaterally.  Normal effort Cardio: S1-S2 is normal regular.  No S3-S4.  No rubs murmurs or bruit GI: Abdomen is soft.  Nontender nondistended.  Bowel sounds are present normal.  No masses organomegaly Extremities: No edema.   Neurologic: Alert and oriented x3.  Right-sided weakness noted   Lab Results:  Data Reviewed: I have personally reviewed following labs and reports of the imaging studies  CBC: Recent Labs  Lab 08/10/22 1457 08/11/22 1417 08/11/22 1432  WBC 6.9 8.3  --   NEUTROABS  --  5.9  --   HGB 16.2 16.4 16.3  HCT 47.0 47.5 48.0  MCV 91.4 90.5  --   PLT 219 232  --     Basic Metabolic Panel: Recent Labs  Lab 08/10/22 1457 08/11/22 1417 08/11/22 1432 08/11/22 2014 08/12/22 1106  NA 133* 135 135 134* 135  K 3.7 3.6 3.7 3.9 3.9  CL 96* 99  --  97* 98  CO2 23 23  --  24 22  GLUCOSE 491* 338*  --  443* 316*  BUN 8 5*  --  8 17  CREATININE 0.84 0.87  --  0.96 1.14  CALCIUM 10.1 9.7  --  9.8 10.5*    GFR: Estimated Creatinine Clearance: 67.5 mL/min (by C-G formula based on SCr of 1.14 mg/dL).  Liver Function Tests: Recent Labs  Lab 08/11/22 1417  AST 18  ALT 16  ALKPHOS 97  BILITOT 1.2  PROT 6.7  ALBUMIN 3.8     Recent Labs  Lab 08/12/22 1426  AMMONIA 25    Coagulation Profile: Recent Labs  Lab 08/10/22 1457  INR 1.0    Cardiac Enzymes: Recent Labs  Lab 08/12/22 1106  CKTOTAL 199    CBG: Recent Labs  Lab 08/11/22 1524 08/11/22 1956 08/11/22 2256 08/12/22 1430 08/13/22 0607  GLUCAP 306* 447* 308* 291* 189*    Lipid Profile: Recent Labs    08/13/22 0700  CHOL 197  HDL 47  LDLCALC 128*  TRIG 109  CHOLHDL 4.2    Thyroid Function Tests: Recent Labs    08/12/22 1106  TSH 29.667*    Radiology  Studies: CT ANGIO HEAD NECK W WO CM  Result Date: 08/12/2022 CLINICAL DATA:  Stroke/TIA, determine embolic source EXAM: CT ANGIOGRAPHY HEAD AND NECK WITH AND WITHOUT CONTRAST TECHNIQUE: Multidetector CT imaging of the head and neck was performed using the standard protocol during bolus administration of intravenous contrast. Multiplanar CT image reconstructions and MIPs were obtained to evaluate the vascular anatomy. Carotid stenosis measurements (when applicable) are obtained utilizing NASCET criteria, using the distal internal carotid diameter as the denominator. RADIATION DOSE REDUCTION: This exam was performed according to the departmental dose-optimization program which includes automated exposure control, adjustment of the mA  and/or kV according to patient size and/or use of iterative reconstruction technique. CONTRAST:  75mL OMNIPAQUE IOHEXOL 350 MG/ML SOLN COMPARISON:  Same day MRI head. FINDINGS: CT HEAD FINDINGS Brain: No acute infarcts better characterized on same day MRI head. No progressive mass effect or acute hemorrhage. Chronic microvascular ischemic disease. No midline shift, mass lesion or hydrocephalus. Vascular: See below. Skull: No acute fracture. Sinuses/Orbits: Clear sinuses.  No acute orbital findings. Other: No mastoid effusions. Review of the MIP images confirms the above findings CTA NECK FINDINGS Aortic arch: Great vessel origins are patent without significant stenosis. Calcific atherosclerosis. Right carotid system: Atherosclerosis at the carotid bifurcation with approximately 40% stenosis. Left carotid system: Atherosclerosis of the carotid bifurcation without greater than 50% stenosis Vertebral arteries: Focal occlusion versus severe/critical stenosis of the left vertebral artery origin with more distal opacification. Moderate right vertebral artery origin stenosis. Remainder of the vertebral arteries are patent bilaterally. Skeleton: No acute abnormality limited assessment. Other  neck: No acute abnormality on limited assessment. Upper chest: Visualized lung apices are clear. Review of the MIP images confirms the above findings CTA HEAD FINDINGS Anterior circulation: Bilateral intracranial ICAs, MCAs and ACAs are patent without proximal hemodynamically significant stenosis. Posterior circulation: Bilateral intradural vertebral arteries, basilar artery and bilateral posterior cerebral arteries are patent without proximally normally in stenosis. Venous sinuses: As permitted by contrast timing, patent. Review of the MIP images confirms the above findings IMPRESSION: 1. Focal occlusion versus severe/critical stenosis of the left vertebral artery origin with more distal opacification. 2. Moderate right vertebral artery origin stenosis. 3. Approximately 40% stenosis of the right ICA origin in the neck. 4.  Aortic Atherosclerosis (ICD10-I70.0). Electronically Signed   By: Feliberto Harts M.D.   On: 08/12/2022 14:40   MR BRAIN WO CONTRAST  Result Date: 08/12/2022 CLINICAL DATA:  Provided history: Neuro deficit, acute, stroke suspected. Fall. Altered mental status. EXAM: MRI HEAD WITHOUT CONTRAST TECHNIQUE: Multiplanar, multiecho pulse sequences of the brain and surrounding structures were obtained without intravenous contrast. COMPARISON:  Head CT 08/11/2022. FINDINGS: Intermittently motion degraded examination. Most notably, the sagittal T1-weighted sequence is severely motion degraded. Within this limitation, findings are as follows. Brain: Mild-to-moderate generalized cerebral atrophy. 16 x 8 mm acute infarct within the left aspect of the pons (series 2, image 14). 6 mm acute infarct within the right thalamus (series 2, image 24). 7 mm acute infarct within the left thalamus (series 2, image 23). 3 mm acute cortical infarct within the inferomedial left parietal lobe (series 3, image 13). 6 mm acute infarct within the right parietal white matter (series 2, image 30) (series 3, image 11). 3 mm  acute/early subacute infarct within the mid-to-posterior left frontal lobe white matter (series 2, image 33) (series 3, image 21). Chronic lacunar infarcts within the bilateral corona radiata and thalami. Background moderate multifocal T2 FLAIR hyperintense signal abnormality within the cerebral white matter, nonspecific but compatible with chronic small vessel ischemic disease. No evidence of an intracranial mass. No extra-axial fluid collection. No midline shift. Vascular: Maintained flow voids within the proximal large arterial vessels. Skull and upper cervical spine: No focal suspicious marrow lesion. Sinuses/Orbits: No mass or acute finding within the imaged orbits. No significant paranasal sinus disease. IMPRESSION: 1. Motion degraded examination. 2. 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 The largest infarct is located within the left aspect of the pons, measuring 16 x 8 mm. Additionally, there is a 3 mm acute/early subacute infarct within  the mid-to-posterior left frontal lobe white matter. Involvement of multiple vascular territories, suspicious for an embolic process. 3. Background parenchymal atrophy, chronic small vessel ischemic disease and chronic lacunar infarcts as described. Electronically Signed   By: Jackey Loge D.O.   On: 08/12/2022 10:20   CT Foot Left Wo Contrast  Result Date: 08/12/2022 CLINICAL DATA:  Foot trauma, occult fracture suspected. EXAM: CT OF THE LEFT FOOT WITHOUT CONTRAST TECHNIQUE: Multidetector CT imaging of the left foot was performed according to the standard protocol. Multiplanar CT image reconstructions were also generated. RADIATION DOSE REDUCTION: This exam was performed according to the departmental dose-optimization program which includes automated exposure control, adjustment of the mA and/or kV according to patient size and/or use of iterative reconstruction technique. COMPARISON:  Radiographs dated Aug 11, 2022 FINDINGS: Bones/Joint/Cartilage There is minimal cortical irregularity about the lateral aspect of the proximal phalanx of the fifth digit which could be due to osseous remodeling or a nondisplaced fracture. There is generalized osteopenia. Mild arthritic changes of the tibiotalar and subtalar joints with marginal spurring. No appreciable fracture of the calcaneus. Ligaments Suboptimally assessed by CT. Muscles and Tendons Muscles are normal in bulk. Tendons of the flexor and extensor compartments appear intact. Mild Achilles enthesopathy. Soft tissues Skin and subcutaneous soft tissues are within normal limits. No fluid collection or hematoma. IMPRESSION: 1. Minimal cortical irregularity about the lateral aspect of the proximal phalanx of the fifth digit which could be due to osseous remodeling or a nondisplaced fracture. 2. Generalized osteopenia. 3. Mild arthritic changes of the tibiotalar and subtalar joints. Electronically Signed   By: Larose Hires D.O.   On: 08/12/2022 09:56   DG Ribs Bilateral W/Chest  Result Date: 08/11/2022 CLINICAL DATA:  Fall, left rib pain EXAM: BILATERAL RIBS AND CHEST - 9 VIEW COMPARISON:  Chest radiograph 08/10/2022 FINDINGS: Atherosclerotic calcification of the aortic arch. Thoracic spondylosis noted. Cardiac and mediastinal margins appear normal. No pneumothorax. The lungs appear clear. No blunting of the costophrenic angles. Subtle irregularity of the right right anterior ninth rib on 1 of the oblique projections, cannot exclude subtle fracture. IMPRESSION: 1. Subtle irregularity of the right anterior ninth rib on one of the oblique projections, cannot exclude a subtle fracture. No other potential rib fractures are detected. 2. Thoracic spondylosis. 3.  Aortic Atherosclerosis (ICD10-I70.0). Electronically Signed   By: Gaylyn Rong M.D.   On: 08/11/2022 15:13   DG Foot Complete Left  Result Date: 08/11/2022 CLINICAL DATA:  Fall, lateral foot pain EXAM: LEFT FOOT -  COMPLETE 3+ VIEW COMPARISON:  None Available. FINDINGS: There is mild cortical irregularity in the lateral metaphysis of the proximal phalanx small toe which could be an indicator of a relatively nondisplaced fracture. The finding is subtle and medial extension is poorly defined. Correlate with point tenderness in this vicinity. Bony demineralization and mild cortical indistinctness along portions of the hindfoot including the posterior subtalar joint and anterior calcaneal process. Linear calcifications in the distal Achilles tendon. Mild cortical irregularity along the dorsal talar neck, probably from spurring. IMPRESSION: 1. Subtle cortical irregularity in the lateral metaphysis of the proximal phalanx small toe could be an indicator of a relatively nondisplaced fracture. Correlate with point tenderness in this vicinity. 2. Bony demineralization and mild cortical indistinctness along portions of the hindfoot. If the patient is unable to bear weight or if there is a high clinical suspicion of hindfoot injury, dedicated CT of the foot should be considered. 3. Linear calcifications in the distal Achilles tendon  compatible with mild calcific tendinopathy. Electronically Signed   By: Gaylyn Rong M.D.   On: 08/11/2022 15:08   CT Head Wo Contrast  Result Date: 08/11/2022 CLINICAL DATA:  Fall, altered mental status EXAM: CT HEAD WITHOUT CONTRAST CT CERVICAL SPINE WITHOUT CONTRAST TECHNIQUE: Multidetector CT imaging of the head and cervical spine was performed following the standard protocol without intravenous contrast. Multiplanar CT image reconstructions of the cervical spine were also generated. RADIATION DOSE REDUCTION: This exam was performed according to the departmental dose-optimization program which includes automated exposure control, adjustment of the mA and/or kV according to patient size and/or use of iterative reconstruction technique. COMPARISON:  08/10/2022 FINDINGS: CT HEAD FINDINGS Brain: No  evidence of acute infarction, hemorrhage, hydrocephalus, extra-axial collection or mass lesion/mass effect. Periventricular and deep white matter hypodensity. Vascular: No hyperdense vessel or unexpected calcification. Skull: Normal. Negative for fracture or focal lesion. Sinuses/Orbits: No acute finding. Other: None. CT CERVICAL SPINE FINDINGS Alignment: Degenerative straightening of the normal cervical lordosis. Skull base and vertebrae: No acute fracture. No primary bone lesion or focal pathologic process. Soft tissues and spinal canal: No prevertebral fluid or swelling. No visible canal hematoma. Disc levels: Moderate multilevel disc space height loss and osteophytosis. Upper chest: Negative. Other: None. IMPRESSION: 1. No acute intracranial pathology. Small-vessel white matter disease. 2. No fracture or static subluxation of the cervical spine. 3. Moderate multilevel cervical disc degenerative disease. Electronically Signed   By: Jearld Lesch M.D.   On: 08/11/2022 14:59   CT Cervical Spine Wo Contrast  Result Date: 08/11/2022 CLINICAL DATA:  Fall, altered mental status EXAM: CT HEAD WITHOUT CONTRAST CT CERVICAL SPINE WITHOUT CONTRAST TECHNIQUE: Multidetector CT imaging of the head and cervical spine was performed following the standard protocol without intravenous contrast. Multiplanar CT image reconstructions of the cervical spine were also generated. RADIATION DOSE REDUCTION: This exam was performed according to the departmental dose-optimization program which includes automated exposure control, adjustment of the mA and/or kV according to patient size and/or use of iterative reconstruction technique. COMPARISON:  08/10/2022 FINDINGS: CT HEAD FINDINGS Brain: No evidence of acute infarction, hemorrhage, hydrocephalus, extra-axial collection or mass lesion/mass effect. Periventricular and deep white matter hypodensity. Vascular: No hyperdense vessel or unexpected calcification. Skull: Normal. Negative for  fracture or focal lesion. Sinuses/Orbits: No acute finding. Other: None. CT CERVICAL SPINE FINDINGS Alignment: Degenerative straightening of the normal cervical lordosis. Skull base and vertebrae: No acute fracture. No primary bone lesion or focal pathologic process. Soft tissues and spinal canal: No prevertebral fluid or swelling. No visible canal hematoma. Disc levels: Moderate multilevel disc space height loss and osteophytosis. Upper chest: Negative. Other: None. IMPRESSION: 1. No acute intracranial pathology. Small-vessel white matter disease. 2. No fracture or static subluxation of the cervical spine. 3. Moderate multilevel cervical disc degenerative disease. Electronically Signed   By: Jearld Lesch M.D.   On: 08/11/2022 14:59       LOS: 1 day   [employer]  [facility] Pager on [email or web address]  08/13/2022, 9:16 AM

## 2022-08-13 NOTE — Plan of Care (Signed)
  Problem: Education: Goal: Ability to describe self-care measures that may prevent or decrease complications (Diabetes Survival Skills Education) will improve Outcome: Progressing Goal: Individualized Educational Video(s) Outcome: Progressing   Problem: Coping: Goal: Ability to adjust to condition or change in health will improve Outcome: Progressing   

## 2022-08-13 NOTE — Progress Notes (Signed)
Patient ID: Anthony Woods, male   DOB: 1955/07/24, 67 y.o.   MRN: [id number] Met with the patient and family to review current situation, rehab process, team conference and plan of care. Reviewed secondary risks including A-fib on Eliquis and ASA, HTN on Tenoretic, HLD on Crestor and Zetia,  DM (A1C 15.5) smoking, thyroidism, and obesity. Reviewed medications and dietary modification. Patient reports not taking meds since January due to lack of funds; interested in Medicare Part C+D. Forwarded concerns to financial [employer]. Reviewed smoking risks and cessation recommendation. Continue to follow along to address educational needs to facilitate preparation for discharge. Pamelia Hoit

## 2022-08-13 NOTE — Progress Notes (Addendum)
Switch from Rivaroxaban to Apixaban for Atrial Fibrillation   Patient does not have drug insurance coverage so has no been adherent to rivaroxaban prior to admission. Spoke with wife and patient and wife will get drug insurance for patient. In the meantime, discussed switching to apixaban so patient can use 30 day free coupon and have better coverage via patient assistance program.   [facility], whom patient has followed with before, to see if they can help with patient assistance. Patient would need a more recent appointment for them to help, let patient know. Provided patient assistance forms to patient and wife to bring to PCP or [facility] visit.   Discussed with [facility], who agrees. Patient and wife agree and are very grateful.   Alphia Moh, PharmD, BCPS, BCCP Clinical Pharmacist  Please check AMION for all [facility] phone numbers After 10:00 PM, call Main Pharmacy [phone]

## 2022-08-13 NOTE — Progress Notes (Addendum)
STROKE TEAM PROGRESS NOTE   INTERVAL HISTORY His wife is at the bedside.   Patient presented 5/22 as a level 2 trauma due to fall on thinners with increased confusion.  Patient had also presented to ED 5/21 after a fall and was discharged home.   MRI showed multiple acute infarcts, a subacute infarct, chronic small vessel ischemic disease and chronic lacunar infarcts.  Patient and wife states he is not compliant with his medications and not been taking any since January of this year.  Importance of medication, specifically aspirin and Xarelto, reviewed with patient and wife at bedside extensively.  Plan for DC to CIR today.  Vitals:   08/12/22 1939 08/12/22 2318 08/13/22 0322 08/13/22 0723  BP: 111/66 126/74 124/70 115/75  Pulse: 75 76 66 69  Resp: 16 16 16 16   Temp: 98.1 F (36.7 C) 97.7 F (36.5 C) 97.7 F (36.5 C) 97.8 F (36.6 C)  TempSrc: Oral Oral Oral Oral  SpO2: 92% 93% 96% 94%  Weight:      Height:       CBC:  Recent Labs  Lab 08/10/22 1457 08/11/22 1417 08/11/22 1432  WBC 6.9 8.3  --   NEUTROABS  --  5.9  --   HGB 16.2 16.4 16.3  HCT 47.0 47.5 48.0  MCV 91.4 90.5  --   PLT 219 232  --    Basic Metabolic Panel:  Recent Labs  Lab 08/11/22 2014 08/12/22 1106  NA 134* 135  K 3.9 3.9  CL 97* 98  CO2 24 22  GLUCOSE 443* 316*  BUN 8 17  CREATININE 0.96 1.14  CALCIUM 9.8 10.5*   Lipid Panel:  Recent Labs  Lab 08/13/22 0700  CHOL 197  TRIG 109  HDL 47  CHOLHDL 4.2  VLDL 22  LDLCALC 409*   HgbA1c:  Recent Labs  Lab 08/11/22 1417  HGBA1C >15.5*   Urine Drug Screen: No results for input(s): "LABOPIA", "COCAINSCRNUR", "LABBENZ", "AMPHETMU", "THCU", "LABBARB" in the last 168 hours.  Alcohol Level No results for input(s): "ETH" in the last 168 hours.  IMAGING past 24 hours DG Swallowing Func-Speech Pathology  Result Date: 08/13/2022 Table formatting from the original result was not included. Modified Barium Swallow Study Patient Details Name: Anthony Woods MRN: [id number] Date of Birth: 12/07/1955 Today's Date: 08/13/2022 HPI/PMH: HPI: The pt is a 67 yo male presenting 5/22 with increased confusion and frequent falls.     Acute infarcts within the left aspect of the pons, bilateral  thalami and bilateral parietal lobes. The largest infarct is located  within the left aspect of the pons, measuring 16 x 8 mm.  Additionally, there is a 3 mm acute/early subacute infarct within  the mid-to-posterior left frontal lobe white matter. Involvement of  multiple vascular territories, suspicious for an embolic process. Clinical Impression: Pt demonstrates mild oropharyngeal dysphagia, noted to have a slightly late swallow initaition at times, likely accounting for instances of cough seen in clinical swallow eval. However, today despite being pushed to drink consecutive large quantites via straw pt had no aspiration and no oral or pharyngeal weakness with residue. Pt has one tooth and no dentures and at baseline can masticate with or without dentures. He demonstrated this ability today. Also despite moderate dysarthria, there was no focal lingual weakness and no significant oral dysphagia. Pt may resume a mechanical soft diet and thin liquids with aspiration precautions. SLP will check in on pts tolerance of meals at least once. Factors that  may increase risk of adverse event in presence of aspiration Rubye Oaks & Clearance Coots 2021): No data recorded Recommendations/Plan: Swallowing Evaluation Recommendations Swallowing Evaluation Recommendations Recommendations: PO diet PO Diet Recommendation: Dysphagia 3 (Mechanical soft); Thin liquids (Level 0) Liquid Administration via: Cup; Straw Medication Administration: Whole meds with liquid Supervision: Staff to assist with self-feeding Postural changes: Position pt fully upright for meals; Stay upright 30-60 min after meals Treatment Plan Treatment Plan Treatment recommendations: Therapy as outlined in treatment plan below Treatment  frequency: Min 1x/week Treatment duration: 1 week Recommendations Recommendations for follow up therapy are one component of a multi-disciplinary discharge planning process, led by the attending physician.  Recommendations may be updated based on patient status, additional functional criteria and insurance authorization. Assessment: Orofacial Exam: Orofacial Exam Oral Cavity: Oral Hygiene: WFL Oral Cavity - Dentition: Edentulous Anatomy: Anatomy: WFL; Suspected cervical osteophytes Boluses Administered: Boluses Administered Boluses Administered: Thin liquids (Level 0); Puree; Solid; Mildly thick liquids (Level 2, nectar thick); Moderately thick liquids (Level 3, honey thick)  Oral Impairment Domain: Oral Impairment Domain Lip Closure: Interlabial escape, no progression to anterior lip Tongue control during bolus hold: Cohesive bolus between tongue to palatal seal Bolus preparation/mastication: Slow prolonged chewing/mashing with complete recollection Bolus transport/lingual motion: Brisk tongue motion Oral residue: Complete oral clearance Location of oral residue : N/A Initiation of pharyngeal swallow : Pyriform sinuses  Pharyngeal Impairment Domain: Pharyngeal Impairment Domain Soft palate elevation: No bolus between soft palate (SP)/pharyngeal wall (PW) Laryngeal elevation: Complete superior movement of thyroid cartilage with complete approximation of arytenoids to epiglottic petiole Anterior hyoid excursion: Complete anterior movement Epiglottic movement: Complete inversion Laryngeal vestibule closure: Complete, no air/contrast in laryngeal vestibule Pharyngeal stripping wave : Present - complete Pharyngoesophageal segment opening: Complete distension and complete duration, no obstruction of flow Tongue base retraction: No contrast between tongue base and posterior pharyngeal wall (PPW) Pharyngeal residue: Complete pharyngeal clearance  Esophageal Impairment Domain: No data recorded Pill: No data recorded  Penetration/Aspiration Scale Score: Penetration/Aspiration Scale Score 1.  Material does not enter airway: Thin liquids (Level 0); Mildly thick liquids (Level 2, nectar thick); Moderately thick liquids (Level 3, honey thick); Puree; Solid Compensatory Strategies: No data recorded  General Information: Caregiver present: No  Diet Prior to this Study: NPO   No data recorded  No data recorded  No data recorded  No data recorded Behavior/Cognition: Alert; Cooperative; Pleasant mood Self-Feeding Abilities: Needs assist with self-feeding Baseline vocal quality/speech: Normal Volitional Cough: Unable to elicit Volitional Swallow: Able to elicit Exam Limitations: No limitations Goal Planning: No data recorded No data recorded No data recorded No data recorded No data recorded Pain: Pain Assessment Pain Assessment: Faces Faces Pain Scale: 4 Pain Location: right knee Pain Descriptors / Indicators: Discomfort Pain Intervention(s): Limited activity within patient's tolerance; Repositioned End of Session: Start Time:SLP Start Time (ACUTE ONLY): 0945 Stop Time: SLP Stop Time (ACUTE ONLY): 1000 Time Calculation:SLP Time Calculation (min) (ACUTE ONLY): 15 min Charges: SLP Evaluations $ SLP Speech Visit: 1 Visit SLP Evaluations $BSS Swallow: 1 Procedure $MBS Swallow: 1 Procedure SLP visit diagnosis: SLP Visit Diagnosis: Dysphagia, unspecified (R13.10) Past Medical History: Past Medical History: Diagnosis Date  Allergy to bee sting   Anxiety   Arthritis   "right knee" (05/12/2016)  CAD (coronary artery disease)   a. DES to LCx 04/2016. b. DES to prox Cx 08/2017. c. Stable cath 09/2018 with mild nonobstructive residual disease, normal EF.  Headache   "when his sugar goes too high or too low" (05/12/2016)  HTN (  hypertension)   Hyperlipidemia   Hypothyroidism   Macular degeneration   Obesity   PAF (paroxysmal atrial fibrillation) (HCC)   Type II diabetes mellitus (HCC)  Past Surgical History: Past Surgical History: Procedure Laterality Date   CARDIAC CATHETERIZATION    COLONOSCOPY    CORONARY ANGIOPLASTY WITH STENT PLACEMENT  05/12/2016  CORONARY STENT INTERVENTION N/A 05/12/2016  Procedure: Coronary Stent Intervention;  Surgeon: Corky Crafts, MD;  Location: [facility] INVASIVE CV LAB;  Service: Cardiovascular;  Laterality: N/A;  CORONARY STENT INTERVENTION N/A 08/31/2017  Procedure: CORONARY STENT INTERVENTION;  Surgeon: Corky Crafts, MD;  Location: [facility] INVASIVE CV LAB;  Service: Cardiovascular;  Laterality: N/A;  GANGLION CYST EXCISION Left 1980s  KNEE ARTHROSCOPY Right   Meniscus removal  LEFT HEART CATH AND CORONARY ANGIOGRAPHY N/A 05/12/2016  Procedure: Left Heart Cath and Coronary Angiography;  Surgeon: Corky Crafts, MD;  Location: [facility] INVASIVE CV LAB;  Service: Cardiovascular;  Laterality: N/A;  LEFT HEART CATH AND CORONARY ANGIOGRAPHY N/A 08/31/2017  Procedure: LEFT HEART CATH AND CORONARY ANGIOGRAPHY;  Surgeon: Corky Crafts, MD;  Location[facility] INVASIVE CV LAB;  Service: Cardiovascular;  Laterality: N/A;  LEFT HEART CATHETERIZATION WITH CORONARY ANGIOGRAM N/A 02/04/2012  Procedure: LEFT HEART CATHETERIZATION WITH CORONARY ANGIOGRAM;  Surgeon: Kathleene Hazel, MD;  Location: [facility] CATH LAB;  Service: Cardiovascular;  Laterality: N/A;  POLYPECTOMY    RIGHT/LEFT HEART CATH AND CORONARY ANGIOGRAPHY N/A 09/28/2018  Procedure: RIGHT/LEFT HEART CATH AND CORONARY ANGIOGRAPHY;  Surgeon: Kathleene Hazel, MD;  Location: [facility] INVASIVE CV LAB;  Service: Cardiovascular;  Laterality: N/A; [place]Blo[place], Riley Nearing 08/13/2022, 10:38 AM  CT ANGIO HEAD NECK W WO CM  Result Date: 08/12/2022 CLINICAL DATA:  Stroke/TIA, determine embolic source EXAM: CT ANGIOGRAPHY HEAD AND NECK WITH AND WITHOUT CONTRAST TECHNIQUE: Multidetector CT imaging of the head and neck was performed using the standard protocol during bolus administration of intravenous contrast. Multiplanar CT image reconstructions and MIPs were obtained to evaluate the vascular  anatomy. Carotid stenosis measurements (when applicable) are obtained utilizing NASCET criteria, using the distal internal carotid diameter as the denominator. RADIATION DOSE REDUCTION: This exam was performed according to the departmental dose-optimization program which includes automated exposure control, adjustment of the mA and/or kV according to patient size and/or use of iterative reconstruction technique. CONTRAST:  75mL OMNIPAQUE IOHEXOL 350 MG/ML SOLN COMPARISON:  Same day MRI head. FINDINGS: CT HEAD FINDINGS Brain: No acute infarcts better characterized on same day MRI head. No progressive mass effect or acute hemorrhage. Chronic microvascular ischemic disease. No midline shift, mass lesion or hydrocephalus. Vascular: See below. Skull: No acute fracture. Sinuses/Orbits: Clear sinuses.  No acute orbital findings. Other: No mastoid effusions. Review of the MIP images confirms the above findings CTA NECK FINDINGS Aortic arch: Great vessel origins are patent without significant stenosis. Calcific atherosclerosis. Right carotid system: Atherosclerosis at the carotid bifurcation with approximately 40% stenosis. Left carotid system: Atherosclerosis of the carotid bifurcation without greater than 50% stenosis Vertebral arteries: Focal occlusion versus severe/critical stenosis of the left vertebral artery origin with more distal opacification. Moderate right vertebral artery origin stenosis. Remainder of the vertebral arteries are patent bilaterally. Skeleton: No acute abnormality limited assessment. Other neck: No acute abnormality on limited assessment. Upper chest: Visualized lung apices are clear. Review of the MIP images confirms the above findings CTA HEAD FINDINGS Anterior circulation: Bilateral intracranial ICAs, MCAs and ACAs are patent without proximal hemodynamically significant stenosis. Posterior circulation: Bilateral intradural vertebral arteries, basilar artery and bilateral posterior cerebral  arteries are patent without proximally normally in stenosis. Venous sinuses: As permitted by contrast timing, patent. Review of the MIP images confirms the above findings IMPRESSION: 1. Focal occlusion versus severe/critical stenosis of the left vertebral artery origin with more distal opacification. 2. Moderate right vertebral artery origin stenosis. 3. Approximately 40% stenosis of the right ICA origin in the neck. 4.  Aortic Atherosclerosis (ICD10-I70.0). Electronically Signed   By: Feliberto Harts M.D.   On: 08/12/2022 14:40    PHYSICAL EXAM GENERAL: Awake, alert in NAD HEENT: - Normocephalic and atraumatic LUNGS - unlabored, room air CV - S1S2 RRR, no m/r/g, equal pulses bilaterally. ABDOMEN - Soft, nontender, nondistended with normoactive BS Ext: warm, well perfused, intact peripheral pulses, no edema,    NEURO:  Mental Status: Alert and oriented to self, time, and location. Does not remember why he is in the hospital. Language: speech is dysarthric.  Naming, repetition, fluency, and comprehension intact. Cranial Nerves: PERRL. EOMI, but slow.  isual fields decreased-no facial asymmetry, facial sensation intact, hearing intact, tongue/uvula/soft palate midline, normal sternocleidomastoid and trapezius muscle strength. No evidence of tongue atrophy or Motor:  RUE 4/5 with drift        LUE 5/5 RLE 4/5 with drift        LLE 5/5 Tone: is normal and bulk is normal Sensation- Decreased to RUE/RLLE Coordination: Ataxia noted with FNF and HKS to right side. RAM slowed bilaterally Gait- deferred  NIHSS:  1a Level of Conscious.: 0 1b LOC Questions: 0 1c LOC Commands: 0 2 Best Gaze: 0 3 Visual: 0 4 Facial Palsy: 0 5a Motor Arm - left: 0 5b Motor Arm - Right:0  6a Motor Leg - Left: 0 6b Motor Leg - Right: 0 7 Limb Ataxia: 1 8 Sensory: 0 9 Best Language: 0 10 Dysarthria: 1 11 Extinct. and Inatten.: 0 TOTAL: 2   ASSESSMENT/PLAN Anthony Woods is a 67 y.o. male with history of  CAD, hypertension, hyperlipidemia, atrial fibrillation on Xarelto, diabetes, hypothyroidism, medication non-compliance  presenting with multiple falls, slurred speech, right-side weakness, fluctuating confusion over the last 3 weeks.   Multiple bilateral acute infarcts left pons, b/l thalami, b/l parietal lobes Subacute left frontal infarct Chronic lacunar infarcts Etiology:  Embolic secondary to anticoagulant medication non-compliance  CT head 5/22: No acute intracranial pathology Small vessel white matter disease CTA head & neck  Focal occlusion versus severe stenosis of the left vertebral artery Moderate right vertebral artery stenosis 40% stenosis right ICA  MRI   Acute infarcts: Left pons, bilateral ophthalmic, bilateral parietal lobes Subacute infarct left frontal lobe white matter Chronic small vessel ischemic disease Chronic lacunar infarcts  2D Echo: pending  LDL 128 HgbA1c >15.5 VTE prophylaxis - xarelto    Diet   DIET DYS 3 Fluid consistency: Thin   aspirin 81 mg daily and Xarelto (rivaroxaban) daily prior to admission, now on aspirin 81 mg daily and Xarelto (rivaroxaban) daily. Compliance education provided. Risk of further embolic events due to stopping the Xarelto due to falls is higher than the risk of bleeding if continued.  Therapy recommendations:  AIR Disposition:  CIR  Atrial Fibrillation Non-compliant with Xarelto at home, restarted Compliance education provided  CAD Hypertension Home meds: Tenoretic Stable Outside of permissive hypertension window, goal BP normotensive S/p multiple hart caths, s/p stent 2018  Hyperlipidemia Home meds: Crestor 20 mg, Zetia 10 mg, resumed in hospital LDL 128, goal < 70 Meds not increased, as patient was not compliant with medications at this  dosage Continue statin at discharge  Diabetes type II Uncontrolled Home meds: Metformin 1000 mg, insulin HgbA1c >15.5, goal < 7.0 CBGs Recent Labs    08/12/22 1430  08/13/22 0607 08/13/22 1206  GLUCAP 291* 189* 197*    SSI Recommend close PCP follow-up as A1c is highly elevated while on diabetic medications  Other Stroke Risk Factors Advanced Age >/= 29  Cigarette smoker, advised to stop smoking. Obesity, Body mass index is 34.85 kg/m., BMI >/= 30 associated with increased stroke risk, recommend weight loss, diet and exercise as appropriate  Hx stroke/TIA   Other Active Problems   Hospital day # 1   Pt seen by Neuro NP/APP and later by MD. Note/plan to be edited by MD as needed.    Lynnae January, DNP, AGACNP-BC [facility] Please use AMION for contact information & EPIC for messaging.  STROKE MD NOTE :  I have personally obtained history,examined this patient, reviewed notes, independently viewed imaging studies, participated in medical decision making and plan of care.ROS completed by me personally and pertinent positives fully documented  I have made any additions or clarifications directly to the above note. Agree with note above.  Patient presented with multiple falls recently and MRI shows bilateral cortical and left pontine infarcts from small vessel disease and noncompliance with his anticoagulation.  Patient and wife counseled about importance of adherence to medication and prevention regimen.  Xarelto being changed to Eliquis as patient can get a coupon and get full medications.  Mobilize out of bed.  Therapy consults.  Patient also counseled about fall precautions.  Will likely need to go to rehab to improve gait and balance and safety.  Long discussion with patient and wife at the bedside and answered questions.  Spoke with Dr. Rito Ehrlich.  Greater than 50% time during this 50-minute visit was spent on counseling and coordination of care about his multiple strokes and discussion about stroke prevention and treatment and answering questions.   Delia Heady, MD Medical Director [facility] Pager:  [phone] 08/13/2022 1:00 PM   To contact Stroke Continuity provider, please refer to [email or web address]. After hours, contact General Neurology

## 2022-08-13 NOTE — Progress Notes (Signed)
Angelina Sheriff, DO  Physician Physical Medicine and Rehabilitation   PMR Pre-admission    Signed   Date of Service: 08/12/2022  4:51 PM  Related encounter: ED to Hosp-Admission (Discharged) from 08/11/2022 in [facility]   Signed      Show:Clear all [x] Written[x] Templated[x] Copied  Added by: [x] Dalvin Clipper, Tye [place], RN[x] Angelina Sheriff, DO  [] Hover for details PMR Admission Coordinator Pre-Admission Assessment   Patient: Anthony Woods is an 67 y.o., male MRN: [id number] DOB: 1955/12/11 Height: 5\' 5"  (165.1 cm) Weight: 95 kg   Insurance Information HMO:     PPO:      PCP:      IPA:      80/20:     OTHER:  PRIMARY: Medicare a and b      Policy#: [id number]      Subscriber: pt Benefits:passport one source online     Name: 5/23 Eff. Date: a 08/20/21 and b 03/22/22     Deduct: $1632      Out of Pocket Max: none      Life Max: none CIR: 100%      SNF: 20 full days Outpatient: 80%     Co-Pay: 20% Home Health: 100%      Co-Pay: none DME: 80%     Co-Pay: 20% Providers: pt choice  SECONDARY: none   Financial Counselor:       Phone#:    The Engineer, materials Information Summary" for patients in Inpatient Rehabilitation Facilities with attached "Privacy Act Statement-Health Care Records" was provided and verbally reviewed with: Patient and Family   Emergency Contact Information Contact Information       Name Relation Home Work Mobile    Riedinger,Debra Spouse   [phone]    Laneta Simmers     [phone]    Kreiter,Carol Sister     [phone]         Current Medical History  Patient Admitting Diagnosis: CVA   History of Present Illness: 67 year old male with history of CAD s/p stent, HTN, HLD, Afib on Xarelto, DM, and hypothyroidism who presented to [facility] ED on 5/21 with a fall, and complaints of dizziness. Imaging showed a possible medical epicondyle fracture bu CT negative. Wife requested to go home. He returned to ED on 5/22  with increased confusion and falls as well as urinary symptoms. Symptoms resolved but his wife felt too weak to go home. Right sided weakness and slurred speech over 3 weeks reportedly.Not taking his meds correctly over past few months.   CT Angio Head and Neck-  Focal occlusion versus severe/critical stenosis of the left vertebral artery origin with more distal opacification. Moderate right vertebral artery origin stenosis. Approximately 40% stenosis of the right ICA origin in the neck. Aortic Atherosclerosis.  MRI examination of the brain- Acute infarcts within the left aspect of the pons, bilateral thalami and bilateral parietal lobes. The largest infarct is located within the left aspect of the pons, measuring 16 x 8 mm. Additionally, there is a 3 mm acute/early subacute infarct within the mid-to-posterior left frontal lobe white matter. Involvement of multiple vascular territories, suspicious for an embolic process. Outside window for permissive HTN. Neurology consulted and patient admitted 5/23.   Felt embolic due to afib and noncompliant with Xarelto. LDL 128. Continue on Crestor and Zetia. Echo pending. Prior to admit patient on atenolol, HCTZ, amlodipine and losartan as well as Imdur. Permissive HTN being allowed. Left foot pain with concern for nondisplaced  fracture involving the proximal phalanx of the fifth digit. Postop shoe and 4th and 5th toes taped together. Metformin on hold. Hgb A1c pending. Levothyroxine resumed. TSH 29.6.   Complete NIHSS TOTAL: 4   Patient's medical record from [facility] has been reviewed by the rehabilitation admission coordinator and physician.   Past Medical History      Past Medical History:  Diagnosis Date   Allergy to bee sting     Anxiety     Arthritis      "right knee" (05/12/2016)   CAD (coronary artery disease)      a. DES to LCx 04/2016. b. DES to prox Cx 08/2017. c. Stable cath 09/2018 with mild nonobstructive residual disease, normal EF.    Headache      "when his sugar goes too high or too low" (05/12/2016)   HTN (hypertension)     Hyperlipidemia     Hypothyroidism     Macular degeneration     Obesity     PAF (paroxysmal atrial fibrillation) (HCC)     Type II diabetes mellitus (HCC)      Has the patient had major surgery during 100 days prior to admission? No   Family History   family history includes Breast cancer in his sister; CAD in his maternal grandmother; CVA in his father and paternal uncle; Cancer in his father and mother; Colon cancer (age of onset: 30) in his mother; Diabetes in his father, mother, and sister; Stomach cancer in his father.   Current Medications   Current Facility-Administered Medications:    acetaminophen (TYLENOL) tablet 650 mg, 650 mg, Oral, Q4H PRN **OR** acetaminophen (TYLENOL) 160 MG/5ML solution 650 mg, 650 mg, Per Tube, Q4H PRN **OR** acetaminophen (TYLENOL) suppository 650 mg, 650 mg, Rectal, Q4H PRN, Jonah Blue, MD   aspirin EC tablet 81 mg, 81 mg, Oral, Daily, Jonah Blue, MD, 81 mg at 08/13/22 0840   [START ON 08/14/2022] atenolol (TENORMIN) tablet 50 mg, 50 mg, Oral, Daily, Osvaldo Shipper, MD   escitalopram (LEXAPRO) tablet 10 mg, 10 mg, Oral, Daily, Jonah Blue, MD, 10 mg at 08/13/22 1610   ezetimibe (ZETIA) tablet 10 mg, 10 mg, Oral, Daily, Jonah Blue, MD, 10 mg at 08/13/22 0839   insulin aspart (novoLOG) injection 0-15 Units, 0-15 Units, Subcutaneous, TID WC & HS, Carollee Herter, DO, 3 Units at 08/13/22 9604   levothyroxine (SYNTHROID) tablet 175 mcg, 175 mcg, Oral, Q0600, Jonah Blue, MD, 175 mcg at 08/13/22 5409   nicotine (NICODERM CQ - dosed in mg/24 hours) patch 21 mg, 21 mg, Transdermal, Daily, Jonah Blue, MD, 21 mg at 08/13/22 0841   rivaroxaban (XARELTO) tablet 20 mg, 20 mg, Oral, Daily, Jonah Blue, MD, 20 mg at 08/13/22 0839   rosuvastatin (CRESTOR) tablet 20 mg, 20 mg, Oral, Daily, Jonah Blue, MD, 20 mg at 08/13/22 8119   senna-docusate  (Senokot-S) tablet 1 tablet, 1 tablet, Oral, QHS PRN, Jonah Blue, MD   Patients Current Diet:  Diet Order                  DIET DYS 3 Fluid consistency: Thin  Diet effective now                     MBS completed 5/24   Precautions / Restrictions Precautions Precautions: Fall Precaution Comments: pt with recent increase in falls in last 3 weeks, no longer able to get up Restrictions Weight Bearing Restrictions: No    Has the patient had  2 or more falls or a fall with injury in the past year? 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 Independent until 3 weeks ago   Prior Functional Level Self Care: Did the patient need help bathing, dressing, using the toilet or eating? Independent   Indoor Mobility: Did the patient need assistance with walking from room to room (with or without device)? Independent   Stairs: Did the patient need assistance with internal or external stairs (with or without device)? Independent   Functional Cognition: Did the patient need help planning regular tasks such as shopping or remembering to take medications? Independent   Patient Information Are you of Hispanic, Latino/a,or Spanish origin?: A. No, not of Hispanic, Latino/a, or Spanish origin What is your race?: A. White Do you need or want an interpreter to communicate with a doctor or health care staff?: 0. No   Patient's Response To:  Health Literacy and Transportation Is the patient able to respond to health literacy and transportation needs?: Yes In the past 12 months, has lack of transportation kept you from medical appointments or from getting medications?: No In the past 12 months, has lack of transportation kept you from meetings, work, or from getting things needed for daily living?: No   Home Assistive Devices / Equipment Home Equipment: Agricultural consultant (2 wheels), Rollator (4 wheels), [employer] - single point, Information systems manager   Prior Device  Use: Indicate devices/aids used by the patient prior to current illness, exacerbation or injury? None of the above   Current Functional Level Cognition   Overall Cognitive Status: Impaired/Different from baseline Current Attention Level: Sustained Orientation Level: Oriented to person, Disoriented to place, Disoriented to time, Disoriented to situation Following Commands: Follows one step commands with increased time, Follows multi-step commands with increased time Safety/Judgement: Decreased awareness of deficits General Comments: pt needing direct cueing for all    Extremity Assessment (includes Sensation/Coordination)   Upper Extremity Assessment: RUE deficits/detail RUE Deficits / Details: Strength 4/5 throughout.  Slight ataxia noted with movements and functional use, dropping of sock when attempting to donn with slower opposition to thumb will all digits noted. RUE Sensation: decreased light touch (decreased light touch noted in the hand) RUE Coordination: decreased fine motor, decreased gross motor LUE Deficits / Details: strength WFLs. slightly slower finger to nose testing LUE Coordination: decreased fine motor  Lower Extremity Assessment: Defer to PT evaluation RLE Deficits / Details: good strength to MMT, poor coordination and motor planning RLE Coordination: decreased fine motor, decreased gross motor LLE Deficits / Details: good strength to MMT, poor coordination and motor planning LLE Coordination: decreased fine motor, decreased gross motor     ADLs   Overall ADL's : Needs assistance/impaired Eating/Feeding: Set up, Sitting Eating/Feeding Details (indicate cue type and reason): simulated Grooming: Sitting, Wash/dry hands, Wash/dry face Grooming Details (indicate cue type and reason): simulated Upper Body Bathing: Set up, Sitting Upper Body Bathing Details (indicate cue type and reason): simulated Lower Body Bathing: Maximal assistance, Sit to/from stand Lower Body  Bathing Details (indicate cue type and reason): simulated Lower Body Dressing: Maximal assistance, Sit to/from stand Toilet Transfer: Maximal assistance, Ambulation, Rolling walker (2 wheels) Toilet Transfer Details (indicate cue type and reason): simulated Toileting- Clothing Manipulation and Hygiene: Maximal assistance, Sit to/from stand Toileting - Clothing Manipulation Details (indicate cue type and reason): simulated Functional mobility during ADLs: Maximal assistance, Rolling walker (2 wheels) (ambulate forward and backwards a few steps) General ADL Comments: Pt  with increased ataxia noted in the RUE with functional use.  LOB to the right in static sitting without UE support as well as posteriorly when attempting to donn his gripper socks.  Decreased ability to advance his LEs efficiently and weightshift to the right for stepping forward with the LUE.     Mobility   Overal bed mobility: Needs Assistance Bed Mobility: Supine to Sit, Sit to Supine Supine to sit: Min assist Sit to supine: Min assist General bed mobility comments: Min assist with increased time and use of the bed rails to sit up.  Min assist for scooting out to the edge of the bed.  Assist needed to lift LEs back in the bed.     Transfers   Overall transfer level: Needs assistance Equipment used: Rolling walker (2 wheels) Transfers: Sit to/from Stand, Bed to chair/wheelchair/BSC Sit to Stand: Mod assist Bed to/from chair/wheelchair/BSC transfer type:: Step pivot Step pivot transfers: Max assist General transfer comment: Decreased efficiency with advancing the LLE with stepping.  Ataxia noted as well     Ambulation / Gait / Stairs / Wheelchair Mobility   Ambulation/Gait General Gait Details: pt unable to steady for more than single step laterally along EOB. poor motor control to advance RLE Gait velocity: decreased     Posture / Balance Dynamic Sitting Balance Sitting balance - Comments: posterior and right LOB in  sitting Balance Overall balance assessment: Needs assistance Sitting-balance support: No upper extremity supported, Feet supported Sitting balance-Leahy Scale: Poor Sitting balance - Comments: posterior and right LOB in sitting Postural control: Posterior lean Standing balance support: Bilateral upper extremity supported, During functional activity Standing balance-Leahy Scale: Zero Standing balance comment: max assist for standing balance with need for assistive device and therapist assist for mobility     Special needs/care consideration Fall precautions Noncompliant with meds prior to admit Hgb A1c pending Smoker DNR on acute hospital    Previous Home Environment  Living Arrangements: Spouse/significant other  Lives With: Spouse Available Help at Discharge: Available 24 hours/day Type of Home: Mobile home Home Layout: One level Home Access: Stairs to enter Entrance Stairs-Rails: Right, Left Entrance Stairs-Number of Steps: 4 Bathroom Shower/Tub: Health visitor: Standard Bathroom Accessibility: Yes How Accessible: Accessible via walker Home Care Services: No   Discharge Living Setting Plans for Discharge Living Setting: Patient's home, Lives with (comment), Mobile Home (wife) Type of Home at Discharge: Mobile home Discharge Home Layout: One level Discharge Home Access: Stairs to enter Entrance Stairs-Rails: Right, Left Entrance Stairs-Number of Steps: 4 Discharge Bathroom Shower/Tub: Walk-in shower Discharge Bathroom Toilet: Standard Discharge Bathroom Accessibility: Yes How Accessible: Accessible via walker Does the patient have any problems obtaining your medications?: No   Social/Family/Support Systems Patient Roles: Spouse Contact Information: wife, Stanton Kidney Anticipated Caregiver: wife Anticipated Caregiver's Contact Information: see contacts Ability/Limitations of Caregiver: none Caregiver Availability: 24/7 Discharge Plan Discussed with Primary  Caregiver: Yes Is Caregiver In Agreement with Plan?: Yes Does Caregiver/Family have Issues with Lodging/Transportation while Pt is in Rehab?: No Goals Patient/Family Goal for Rehab: supervision PT, OT and SLP Expected length of stay: ELOS 10 to 14 days Pt/Family Agrees to Admission and willing to participate: Yes Program Orientation Provided & Reviewed with Pt/Caregiver Including Roles  & Responsibilities: Yes   Decrease burden of Care through IP rehab admission: n/a   Possible need for SNF placement upon discharge: not anticipated   Patient Condition: I have reviewed medical records from [facility], spoken with CM, and patient and spouse.  I met with patient at the bedside for inpatient rehabilitation assessment.  Patient will benefit from ongoing PT, OT, and SLP, can actively participate in 3 hours of therapy a day 5 days of the week, and can make measurable gains during the admission.  Patient will also benefit from the coordinated team approach during an Inpatient Acute Rehabilitation admission.  The patient will receive intensive therapy as well as Rehabilitation physician, nursing, social worker, and care management interventions.  Due to bladder management, bowel management, safety, skin/wound care, disease management, medication administration, pain management, and patient education the patient requires 24 hour a day rehabilitation nursing.  The patient is currently mod assist overall with mobility and basic ADLs.  Discharge setting and therapy post discharge at home with home health is anticipated.  Patient has agreed to participate in the Acute Inpatient Rehabilitation Program and will admit today.   Preadmission Screen Completed By:  Clois Dupes, RN MSN 08/13/2022 10:47 AM ______________________________________________________________________   Discussed status with Dr. Shearon Stalls on 08/13/22 at 1049 and received approval for admission today.   Admission Coordinator:   Clois Dupes, RN MSN time 1047 Date 08/13/22    Assessment/Plan: Diagnosis: Does the need for close, 24 hr/day Medical supervision in concert with the patient's rehab needs make it unreasonable for this patient to be served in a less intensive setting? Yes Co-Morbidities requiring supervision/potential complications: A fib, hypertension, L foot 5th digit fracture, uncontrolled diabetes, hypothyroidism, dysphagia, bowel and bladder incontinence Due to bladder management, bowel management, safety, skin/wound care, disease management, medication administration, pain management, and patient education, does the patient require 24 hr/day rehab nursing? Yes Does the patient require coordinated care of a physician, rehab nurse, PT, OT, and SLP to address physical and functional deficits in the context of the above medical diagnosis(es)? Yes Addressing deficits in the following areas: balance, endurance, locomotion, strength, transferring, bowel/bladder control, bathing, dressing, feeding, grooming, toileting, cognition, speech, language, and swallowing Can the patient actively participate in an intensive therapy program of at least 3 hrs of therapy 5 days a week? Yes The potential for patient to make measurable gains while on inpatient rehab is good Anticipated functional outcomes upon discharge from inpatient rehab: supervision PT, supervision OT, supervision SLP Estimated rehab length of stay to reach the above functional goals is: 10-14 days Anticipated discharge destination: Home 10. Overall Rehab/Functional Prognosis: good     MD Signature:   Angelina Sheriff, DO 08/13/2022           Revision History

## 2022-08-13 NOTE — H&P (Signed)
Physical Medicine and Rehabilitation Admission H&P     CC: Functional deficits secondary to scattered acute ischemic infarcts in the left aspect of the pons, bilateral thalami, bilateral parietal lobes likely central embolic secondary to atrial fibrillation noncompliant with Xarelto    HPI: Anthony Woods is a 67 year old male who presented initially to the ED on 08/10/2022 complaining of multiple falls. Found to be hyperglycemic. CT scan of right knee/distal femur, CT head and c-spine, pelvic x-rays and chest x-ray performed. Patient discharged home as he did not want to stay for further work-up. Re-presented on 5/22 with increased confusion and hyperglycemia. Patient and wife states he is not compliant with his medications and not been taking any since January of this year. Repeat imaging with CT c-spine, head and left foot and rib x-rays performed. A1c 15.5%. Xarelto resumed and aspirin 81 mg added.  Informed by pharmacy that apixaban will be better covered by patient's insurance. Changed to apixaban.  He was independent with all mobility and ADLs, but has had increased confusion, falls, and difficulty in last few weeks progressing to the point where his wife is unable to get him up. On 5/23 per OT, max assist for simulated selfcare sit to stand EOB. Increased lean and LOB to the right and posteriorly with dressing tasks. Mod assist for sit to stand with max assist for taking steps forward and backwards with the RW. Increased ataxia noted in standing and with using the RUE. On dysphagia 3 diet.  MRI showed multiple acute infarcts, a subacute infarct, chronic small vessel ischemic disease and chronic lacunar infarcts.  Patient and wife states he is not compliant with his medications and not been taking any since January of this year. The patient requires inpatient medicine and rehabilitation evaluations and services for ongoing dysfunction secondary to scattered acute ischemic infarcts in the left aspect of  the pons.   ROS     Past Medical History:  Diagnosis Date   Allergy to bee sting     Anxiety     Arthritis      "right knee" (05/12/2016)   CAD (coronary artery disease)      a. DES to LCx 04/2016. b. DES to prox Cx 08/2017. c. Stable cath 09/2018 with mild nonobstructive residual disease, normal EF.   Headache      "when his sugar goes too high or too low" (05/12/2016)   HTN (hypertension)     Hyperlipidemia     Hypothyroidism     Macular degeneration     Obesity     PAF (paroxysmal atrial fibrillation) (HCC)     Type II diabetes mellitus (HCC)           Past Surgical History:  Procedure Laterality Date   CARDIAC CATHETERIZATION       COLONOSCOPY       CORONARY ANGIOPLASTY WITH STENT PLACEMENT   05/12/2016   CORONARY STENT INTERVENTION N/A 05/12/2016    Procedure: Coronary Stent Intervention;  Surgeon: Corky Crafts, MD;  Location: [facility] INVASIVE CV LAB;  Service: Cardiovascular;  Laterality: N/A;   CORONARY STENT INTERVENTION N/A 08/31/2017    Procedure: CORONARY STENT INTERVENTION;  Surgeon: Corky Crafts, MD;  Location: [facility] INVASIVE CV LAB;  Service: Cardiovascular;  Laterality: N/A;   GANGLION CYST EXCISION Left 1980s   KNEE ARTHROSCOPY Right      Meniscus removal   LEFT HEART CATH AND CORONARY ANGIOGRAPHY N/A 05/12/2016    Procedure: Left Heart Cath and Coronary Angiography;  Surgeon: Corky Crafts, MD;  Location: [facility] INVASIVE CV LAB;  Service: Cardiovascular;  Laterality: N/A;   LEFT HEART CATH AND CORONARY ANGIOGRAPHY N/A 08/31/2017    Procedure: LEFT HEART CATH AND CORONARY ANGIOGRAPHY;  Surgeon: Corky Crafts, MD;  Location: [facility] INVASIVE CV LAB;  Service: Cardiovascular;  Laterality: N/A;   LEFT HEART CATHETERIZATION WITH CORONARY ANGIOGRAM N/A 02/04/2012    Procedure: LEFT HEART CATHETERIZATION WITH CORONARY ANGIOGRAM;  Surgeon: Kathleene Hazel, MD;  Location: [facility] CATH LAB;  Service: Cardiovascular;  Laterality: N/A;   POLYPECTOMY       RIGHT/LEFT  HEART CATH AND CORONARY ANGIOGRAPHY N/A 09/28/2018    Procedure: RIGHT/LEFT HEART CATH AND CORONARY ANGIOGRAPHY;  Surgeon: Kathleene Hazel, MD;  Location: [facility] INVASIVE CV LAB;  Service: Cardiovascular;  Laterality: N/A;         Family History  Problem Relation Age of Onset   Diabetes Mother     Cancer Mother          male ca   Colon cancer Mother 58   Diabetes Father     Cancer Father          stomach   CVA Father     Stomach cancer Father     Breast cancer Sister     Diabetes Sister     CAD Maternal Grandmother     CVA Paternal Uncle     Esophageal cancer Neg Hx     Rectal cancer Neg Hx      Social History:  reports that he has been smoking cigarettes. He has a 126.00 pack-year smoking history. He has never used smokeless tobacco. He reports that he does not drink alcohol and does not use drugs. Allergies:       Allergies  Allergen Reactions   Bee Venom Anaphylaxis      Other reaction(s): Unknown   Actos [Pioglitazone Hydrochloride] Swelling   Lipitor [Atorvastatin] Other (See Comments)      Patient cannot recall reaction   Lisinopril Other (See Comments)      cough          Medications Prior to Admission  Medication Sig Dispense Refill   acetaminophen (TYLENOL) 500 MG tablet Take 1 tablet (500 mg total) by mouth every 6 (six) hours as needed. 30 tablet 0   atenolol-chlorthalidone (TENORETIC) 100-25 MG tablet TAKE 1 TABLET BY MOUTH DAILY. 90 tablet 3   Cyanocobalamin (B-12) 500 MCG TABS TAKE 1 TABLET (500 MCG TOTAL) BY MOUTH DAILY. 90 tablet 3   diphenhydrAMINE (BENADRYL) 25 mg capsule Take 25 mg by mouth as needed (bee stings).       EPINEPHrine (EPIPEN 2-PAK) 0.3 mg/0.3 mL IJ SOAJ injection Inject 0.3 mg into the muscle as needed for anaphylaxis. 2 each 1   escitalopram (LEXAPRO) 10 MG tablet TAKE 1 TABLET (10 MG TOTAL) BY MOUTH DAILY. ANNUAL APPT DUE IN JUNE MUST SEE PROVIDER FOR FUTURE REFILLS 30 tablet 0   ezetimibe (ZETIA) 10 MG tablet TAKE 1 TABLET BY MOUTH  DAILY. (Patient taking differently: Take 10 mg by mouth daily.) 15 tablet 0   isosorbide mononitrate (IMDUR) 30 MG 24 hr tablet TAKE 1 TABLET BY MOUTH DAILY. ** DO NOT CRUSH ** (Patient taking differently: Take 30 mg by mouth daily.) 30 tablet 2   levothyroxine (SYNTHROID) 200 MCG tablet Take 200 mcg by mouth every morning.       metFORMIN (GLUCOPHAGE) 1000 MG tablet Take 1 tablet (1,000 mg total) by mouth 2 (two) times  daily with a meal. 180 tablet 3   nitroGLYCERIN (NITROSTAT) 0.4 MG SL tablet Place 1 tablet (0.4 mg total) under the tongue every 5 (five) minutes as needed for chest pain. 20 tablet 3   OZEMPIC, 0.25 OR 0.5 MG/DOSE, 2 MG/1.5ML SOPN Inject 0.5 mg into the skin once a week.   6   potassium chloride SA (KLOR-CON M) 20 MEQ tablet TAKE 1 TABLET BY MOUTH 2 TIMES DAILY. (Patient taking differently: Take 20 mEq by mouth 2 (two) times daily.) 30 tablet 0   rosuvastatin (CRESTOR) 20 MG tablet TAKE 1 TABLET (20 MG TOTAL) BY MOUTH DAILY. PLEASE MAKE OVERDUE APPT WITH DR. Clifton James BEFORE ANYMORE REFILLS. THANK YOU 3RD ATTEMPT (Patient taking differently: Take 20 mg by mouth daily.) 15 tablet 0   XARELTO 20 MG TABS tablet TAKE 1 TABLET BY MOUTH EVERY DAY 30 tablet 11   aspirin EC 81 MG tablet Take 1 tablet (81 mg total) by mouth daily. (Patient not taking: Reported on 08/11/2022) 90 tablet 3   insulin lispro protamine-lispro (HUMALOG 75/25 MIX) (75-25) 100 UNIT/ML SUSP injection Inject 40 Units into the skin daily. (Patient not taking: Reported on 08/11/2022)       Insulin Pen Needle (ULTICARE MICRO PEN NEEDLES) 32G X 4 MM MISC use with insulin pen to inject insulin 2x daily. 100 each PRN          Home: Home Living Family/patient expects to be discharged to:: Private residence Living Arrangements: Spouse/significant other Available Help at Discharge: Available 24 hours/day Type of Home: Mobile home Home Access: Stairs to enter [employer] of Steps: 4 Entrance Stairs-Rails: Right,  Left Home Layout: One level Bathroom Shower/Tub: Health visitor: Standard Bathroom Accessibility: Yes Home Equipment: Agricultural consultant (2 wheels), Rollator (4 wheels), Cane - single point, Information systems manager  Lives With: Spouse   Functional History: Prior Function Prior Level of Function : Independent/Modified Independent, History of Falls (last six months) Mobility Comments: until last 3 weeks, pt independent. but then had increase in falls. originally able to get up with wife's assist, but now is not able to get up even with her help. does use RW ADLs Comments: prior to last 3 weeks, pt was independent but now will only shower or dress when he feels like it   Functional Status:  Mobility: Bed Mobility Overal bed mobility: Needs Assistance Bed Mobility: Supine to Sit, Sit to Supine Supine to sit: Min assist Sit to supine: Min assist General bed mobility comments: Min assist with increased time and use of the bed rails to sit up.  Min assist for scooting out to the edge of the bed.  Assist needed to lift LEs back in the bed. Transfers Overall transfer level: Needs assistance Equipment used: Rolling walker (2 wheels) Transfers: Sit to/from Stand, Bed to chair/wheelchair/BSC Sit to Stand: Mod assist Bed to/from chair/wheelchair/BSC transfer type:: Step pivot Step pivot transfers: Max assist General transfer comment: Decreased efficiency with advancing the LLE with stepping.  Ataxia noted as well Ambulation/Gait General Gait Details: pt unable to steady for more than single step laterally along EOB. poor motor control to advance RLE Gait velocity: decreased   ADL: ADL Overall ADL's : Needs assistance/impaired Eating/Feeding: Set up, Sitting Eating/Feeding Details (indicate cue type and reason): simulated Grooming: Sitting, Wash/dry hands, Wash/dry face Grooming Details (indicate cue type and reason): simulated Upper Body Bathing: Set up, Sitting Upper Body Bathing  Details (indicate cue type and reason): simulated Lower Body Bathing: Maximal assistance, Sit to/from  stand Lower Body Bathing Details (indicate cue type and reason): simulated Lower Body Dressing: Maximal assistance, Sit to/from stand Toilet Transfer: Maximal assistance, Ambulation, Rolling walker (2 wheels) Toilet Transfer Details (indicate cue type and reason): simulated Toileting- Clothing Manipulation and Hygiene: Maximal assistance, Sit to/from stand Toileting - Clothing Manipulation Details (indicate cue type and reason): simulated Functional mobility during ADLs: Maximal assistance, Rolling walker (2 wheels) (ambulate forward and backwards a few steps) General ADL Comments: Pt with increased ataxia noted in the RUE with functional use.  LOB to the right in static sitting without UE support as well as posteriorly when attempting to donn his gripper socks.  Decreased ability to advance his LEs efficiently and weightshift to the right for stepping forward with the LUE.   Cognition: Cognition Overall Cognitive Status: Impaired/Different from baseline Arousal/Alertness: Awake/alert Orientation Level: Oriented to person, Oriented to place, Oriented to situation Attention: Selective Selective Attention: Appears intact Memory: Impaired Memory Impairment: Decreased short term memory Decreased Short Term Memory: Verbal basic Cognition Arousal/Alertness: Awake/alert Behavior During Therapy: WFL for tasks assessed/performed Overall Cognitive Status: Impaired/Different from baseline Area of Impairment: Orientation Orientation Level: Disoriented to (disoriented to place) Current Attention Level: Sustained Memory: Decreased short-term memory (1/3 word recall after 2 min delay) Following Commands: Follows one step commands with increased time, Follows multi-step commands with increased time Safety/Judgement: Decreased awareness of deficits Awareness: Intellectual Problem Solving: Requires  tactile cues, Requires verbal cues General Comments: pt needing direct cueing for all   Physical Exam: Blood pressure 115/75, pulse 69, temperature 97.8 F (36.6 C), temperature source Oral, resp. rate 16, height 5\' 5"  (1.651 m), weight 95 kg, SpO2 94 %. Physical Exam  Constitutional: No apparent distress. Appropriate appearance for age.  HENT: No JVD. Neck Supple. Trachea midline. Atraumatic, normocephalic. +.  With nicotine staining Eyes: PERRLA. EOMI. Visual fields grossly intact.   Cardiovascular: RRR, no murmurs/rub/gallops. No Edema. Peripheral pulses 2+  Respiratory: CTAB.  Reduced bilateral lung sounds.  Barrel chested appearance.  No rales, rhonchi, or wheezing. On RA.  Abdomen: + bowel sounds, normoactive. No distention or tenderness.  GU: Not examined. + Condom catheter, draining clear urine.  Skin: C/D/I. No apparent lesions.  Chronic appearing skin changes on bilateral upper extremities. MSK:      No apparent deformity. + Buddy taping left fourth and fifth toes      Strength:                RUE: 4 out of 5 strength throughout                LUE: 5 out of 5 strength throughout                RLE: 2/5 HF, 2/5 KE, 3/5 DF, 3/5 EHL, 3/5 PF                 LLE: 5 out of 5 strength throughout  Neurologic exam:  Cognition: AAO to person, place, time and event with some cueing for time.  + R sided neglect -mild Language: Fluent, 1-2 word answers.  Mild dysarthria. Names 2/3 objects correctly.  Memory: Recalls 0/3 objects at 5 minutes.  Insight: Poor insight into current condition.  Mood: Pleasant affect, normal and flat, appropriate mood.  Sensation: Right hemisensory loss throughout Reflexes: 2+ in BL UE and LEs. + RUE Hoffman's  CN: + Right V1 through 3 hemisensory loss, + right facial droop Coordination: Bilateral upper extremity ataxia, right greater than left Spasticity: MAS 0  in all extremities.       Lab Results Last 48 Hours        Results for orders placed or  performed during the hospital encounter of 08/11/22 (from the past 48 hour(s))  Comprehensive metabolic panel     Status: Abnormal    Collection Time: 08/11/22  2:17 PM  Result Value Ref Range    Sodium 135 135 - 145 mmol/L    Potassium 3.6 3.5 - 5.1 mmol/L    Chloride 99 98 - 111 mmol/L    CO2 23 22 - 32 mmol/L    Glucose, Bld 338 (H) 70 - 99 mg/dL      Comment: Glucose reference range applies only to samples taken after fasting for at least 8 hours.    BUN 5 (L) 8 - 23 mg/dL    Creatinine, Ser 1.61 0.61 - 1.24 mg/dL    Calcium 9.7 8.9 - 09.6 mg/dL    Total Protein 6.7 6.5 - 8.1 g/dL    Albumin 3.8 3.5 - 5.0 g/dL    AST 18 15 - 41 U/L    ALT 16 0 - 44 U/L    Alkaline Phosphatase 97 38 - 126 U/L    Total Bilirubin 1.2 0.3 - 1.2 mg/dL    GFR, Estimated >04 >54 mL/min      Comment: (NOTE) Calculated using the CKD-EPI Creatinine Equation (2021)      Anion gap 13 5 - 15      Comment: Performed at [facility], [address], [place], [place] [address]  CBC with Differential     Status: None    Collection Time: 08/11/22  2:17 PM  Result Value Ref Range    WBC 8.3 4.0 - 10.5 K/uL    RBC 5.25 4.22 - 5.81 MIL/uL    Hemoglobin 16.4 13.0 - 17.0 g/dL    HCT 91.4 78.2 - 95.6 %    MCV 90.5 80.0 - 100.0 fL    MCH 31.2 26.0 - 34.0 pg    MCHC 34.5 30.0 - 36.0 g/dL    RDW 21.3 08.6 - 57.8 %    Platelets 232 150 - 400 K/uL    nRBC 0.0 0.0 - 0.2 %    Neutrophils Relative % 71 %    Neutro Abs 5.9 1.7 - 7.7 K/uL    Lymphocytes Relative 20 %    Lymphs Abs 1.7 0.7 - 4.0 K/uL    Monocytes Relative 7 %    Monocytes Absolute 0.6 0.1 - 1.0 K/uL    Eosinophils Relative 1 %    Eosinophils Absolute 0.1 0.0 - 0.5 K/uL    Basophils Relative 1 %    Basophils Absolute 0.1 0.0 - 0.1 K/uL    Immature Granulocytes 0 %    Abs Immature Granulocytes 0.03 0.00 - 0.07 K/uL      Comment: Performed at [facility], [address], [place], [place] [address]  Troponin I (High Sensitivity)      Status: None    Collection Time: 08/11/22  2:17 PM  Result Value Ref Range    Troponin I (High Sensitivity) 9 <18 ng/L      Comment: (NOTE) Elevated high sensitivity troponin I (hsTnI) values and significant  changes across serial measurements may suggest ACS but many other  chronic and acute conditions are known to elevate hsTnI results.  Refer to the "Links" section for chest pain algorithms and additional  guidance. Performed at [facility],  [address] [place], [place] [address]    Hemoglobin A1c     Status: Abnormal    Collection Time: 08/11/22  2:17 PM  Result Value Ref Range    Hgb A1c MFr Bld >15.5 (H) 4.8 - 5.6 %      Comment: (NOTE) **Verified by repeat analysis**         Prediabetes: 5.7 - 6.4         Diabetes: >6.4         Glycemic control for adults with diabetes: <7.0      Mean Plasma Glucose >398 mg/dL      Comment: (NOTE) Performed At: [facility]  [address] [place], [place] [id number] Jolene Schimke MD NG:[id number]    I-Stat venous blood gas, ED     Status: Abnormal    Collection Time: 08/11/22  2:32 PM  Result Value Ref Range    pH, Ven 7.371 7.25 - 7.43    pCO2, Ven 40.4 (L) 44 - 60 mmHg    pO2, Ven 40 32 - 45 mmHg    Bicarbonate 23.4 20.0 - 28.0 mmol/L    TCO2 25 22 - 32 mmol/L    O2 Saturation 73 %    Acid-base deficit 2.0 0.0 - 2.0 mmol/L    Sodium 135 135 - 145 mmol/L    Potassium 3.7 3.5 - 5.1 mmol/L    Calcium, Ion 1.22 1.15 - 1.40 mmol/L    HCT 48.0 39.0 - 52.0 %    Hemoglobin 16.3 13.0 - 17.0 g/dL    Sample type VENOUS    Urinalysis, w/ Reflex to Culture (Infection Suspected) -Urine, Clean Catch     Status: Abnormal    Collection Time: 08/11/22  3:20 PM  Result Value Ref Range    Specimen Source URINE, CLEAN CATCH      Color, Urine YELLOW YELLOW    APPearance CLEAR CLEAR    Specific Gravity, Urine 1.028 1.005 - 1.030    pH 5.0 5.0 - 8.0    Glucose, UA >=500 (A) NEGATIVE mg/dL    Hgb urine dipstick SMALL (A)  NEGATIVE    Bilirubin Urine NEGATIVE NEGATIVE    Ketones, ur 20 (A) NEGATIVE mg/dL    Protein, ur >=401 (A) NEGATIVE mg/dL    Nitrite NEGATIVE NEGATIVE    Leukocytes,Ua NEGATIVE NEGATIVE    RBC / HPF 0-5 0 - 5 RBC/hpf    WBC, UA 0-5 0 - 5 WBC/hpf      Comment:        Reflex urine culture not performed if WBC <=10, OR if Squamous epithelial cells >5. If Squamous epithelial cells >5 suggest recollection.      Bacteria, UA NONE SEEN NONE SEEN    Squamous Epithelial / HPF 0-5 0 - 5 /HPF      Comment: Performed at [facility], [address] [place], [place] [address]  CBG monitoring, ED     Status: Abnormal    Collection Time: 08/11/22  3:24 PM  Result Value Ref Range    Glucose-Capillary 306 (H) 70 - 99 mg/dL      Comment: Glucose reference range applies only to samples taken after fasting for at least 8 hours.  Troponin I (High Sensitivity)     Status: None    Collection Time: 08/11/22  4:26 PM  Result Value Ref Range    Troponin I (High Sensitivity) 10 <18 ng/L      Comment: (NOTE) Elevated high sensitivity troponin I (hsTnI)  values and significant  changes across serial measurements may suggest ACS but many other  chronic and acute conditions are known to elevate hsTnI results.  Refer to the "Links" section for chest pain algorithms and additional  guidance. Performed at [facility], [address] [place], [place] [address]    CBG monitoring, ED     Status: Abnormal    Collection Time: 08/11/22  7:56 PM  Result Value Ref Range    Glucose-Capillary 447 (H) 70 - 99 mg/dL      Comment: Glucose reference range applies only to samples taken after fasting for at least 8 hours.  Basic metabolic panel     Status: Abnormal    Collection Time: 08/11/22  8:14 PM  Result Value Ref Range    Sodium 134 (L) 135 - 145 mmol/L    Potassium 3.9 3.5 - 5.1 mmol/L    Chloride 97 (L) 98 - 111 mmol/L    CO2 24 22 - 32 mmol/L    Glucose, Bld 443 (H) 70 - 99 mg/dL      Comment:  Glucose reference range applies only to samples taken after fasting for at least 8 hours.    BUN 8 8 - 23 mg/dL    Creatinine, Ser 6.04 0.61 - 1.24 mg/dL    Calcium 9.8 8.9 - 54.0 mg/dL    GFR, Estimated >98 >11 mL/min      Comment: (NOTE) Calculated using the CKD-EPI Creatinine Equation (2021)      Anion gap 13 5 - 15      Comment: Performed at [facility], [address] [place], [place] [address]  CBG monitoring, ED     Status: Abnormal    Collection Time: 08/11/22 10:56 PM  Result Value Ref Range    Glucose-Capillary 308 (H) 70 - 99 mg/dL      Comment: Glucose reference range applies only to samples taken after fasting for at least 8 hours.  TSH     Status: Abnormal    Collection Time: 08/12/22 11:06 AM  Result Value Ref Range    TSH 29.667 (H) 0.350 - 4.500 uIU/mL      Comment: Performed by a 3rd Generation assay with a functional sensitivity of <=0.01 uIU/mL. Performed at [facility], [address], [place], [place] [address]    CK     Status: None    Collection Time: 08/12/22 11:06 AM  Result Value Ref Range    Total CK 199 49 - 397 U/L      Comment: Performed at [facility], [address], [place], [place] [address]  Lactic acid, plasma     Status: Abnormal    Collection Time: 08/12/22 11:06 AM  Result Value Ref Range    Lactic Acid, Venous 2.2 (HH) 0.5 - 1.9 mmol/L      Comment: CRITICAL RESULT CALLED TO, READ BACK BY AND VERIFIED WITH Wallis Bamberg, RN @ 1259 08/12/22 BY [facility] Performed at [facility], [address], [place], [place] [address]    Basic metabolic panel     Status: Abnormal    Collection Time: 08/12/22 11:06 AM  Result Value Ref Range    Sodium 135 135 - 145 mmol/L    Potassium 3.9 3.5 - 5.1 mmol/L    Chloride 98 98 - 111 mmol/L    CO2 22 22 - 32 mmol/L    Glucose, Bld 316 (H) 70 - 99 mg/dL  Comment: Glucose reference range applies only to samples taken after fasting for at least 8 hours.    BUN 17 8 - 23 mg/dL     Creatinine, Ser 1.91 0.61 - 1.24 mg/dL    Calcium 47.8 (H) 8.9 - 10.3 mg/dL    GFR, Estimated >29 >56 mL/min      Comment: (NOTE) Calculated using the CKD-EPI Creatinine Equation (2021)      Anion gap 15 5 - 15      Comment: Performed at [facility], [address] [place], [place] [address]  Ammonia     Status: None    Collection Time: 08/12/22  2:26 PM  Result Value Ref Range    Ammonia 25 9 - 35 umol/L      Comment: Performed at [facility], [address] [place], [place] [address]  HIV Antibody (routine testing w rflx)     Status: None    Collection Time: 08/12/22  2:26 PM  Result Value Ref Range    HIV Screen 4th Generation wRfx Non Reactive Non Reactive      Comment: Performed at [facility], [address] [place], [place] [address]  Glucose, capillary     Status: Abnormal    Collection Time: 08/12/22  2:30 PM  Result Value Ref Range    Glucose-Capillary 291 (H) 70 - 99 mg/dL      Comment: Glucose reference range applies only to samples taken after fasting for at least 8 hours.  Glucose, capillary     Status: Abnormal    Collection Time: 08/13/22  6:07 AM  Result Value Ref Range    Glucose-Capillary 189 (H) 70 - 99 mg/dL      Comment: Glucose reference range applies only to samples taken after fasting for at least 8 hours.  Lipid panel     Status: Abnormal    Collection Time: 08/13/22  7:00 AM  Result Value Ref Range    Cholesterol 197 0 - 200 mg/dL    Triglycerides 528 <413 mg/dL    HDL 47 >24 mg/dL    Total CHOL/HDL Ratio 4.2 RATIO    VLDL 22 0 - 40 mg/dL    LDL Cholesterol 401 (H) 0 - 99 mg/dL      Comment:        Total Cholesterol/HDL:CHD Risk Coronary Heart Disease Risk Table                     Men   Women  1/2 Average Risk   3.4   3.3  Average Risk       5.0   4.4  2 X Average Risk   9.6   7.1  3 X Average Risk  23.4   11.0        Use the calculated Patient Ratio above and the CHD Risk Table to determine the patient's CHD Risk.         ATP III CLASSIFICATION (LDL):  <100     mg/dL   Optimal  027-253  mg/dL   Near or Above                    Optimal  130-159  mg/dL   Borderline  664-403  mg/dL   High  >474     mg/dL   Very High Performed at [facility], [address] [place], [place] [address]    Glucose, capillary     Status:  Abnormal    Collection Time: 08/13/22 12:06 PM  Result Value Ref Range    Glucose-Capillary 197 (H) 70 - 99 mg/dL      Comment: Glucose reference range applies only to samples taken after fasting for at least 8 hours.       Imaging Results (Last 48 hours)  DG Swallowing Func-Speech Pathology   Result Date: 08/13/2022 Table formatting from the original result was not included. Modified Barium Swallow Study Patient Details Name: KAGE DULL MRN: [id number] Date of Birth: [birth year] Today's Date: 08/13/2022 HPI/PMH: HPI: The pt is a 67 yo male presenting 5/22 with increased confusion and frequent falls.     Acute infarcts within the left aspect of the pons, bilateral  thalami and bilateral parietal lobes. The largest infarct is located  within the left aspect of the pons, measuring 16 x 8 mm.  Additionally, there is a 3 mm acute/early subacute infarct within  the mid-to-posterior left frontal lobe white matter. Involvement of  multiple vascular territories, suspicious for an embolic process. Clinical Impression: Pt demonstrates mild oropharyngeal dysphagia, noted to have a slightly late swallow initaition at times, likely accounting for instances of cough seen in clinical swallow eval. However, today despite being pushed to drink consecutive large quantites via straw pt had no aspiration and no oral or pharyngeal weakness with residue. Pt has one tooth and no dentures and at baseline can masticate with or without dentures. He demonstrated this ability today. Also despite moderate dysarthria, there was no focal lingual weakness and no significant oral dysphagia. Pt may resume a mechanical soft  diet and thin liquids with aspiration precautions. SLP will check in on pts tolerance of meals at least once. Factors that may increase risk of adverse event in presence of aspiration Rubye Oaks & Clearance Coots 2021): No data recorded Recommendations/Plan: Swallowing Evaluation Recommendations Swallowing Evaluation Recommendations Recommendations: PO diet PO Diet Recommendation: Dysphagia 3 (Mechanical soft); Thin liquids (Level 0) Liquid Administration via: Cup; Straw Medication Administration: Whole meds with liquid Supervision: Staff to assist with self-feeding Postural changes: Position pt fully upright for meals; Stay upright 30-60 min after meals Treatment Plan Treatment Plan Treatment recommendations: Therapy as outlined in treatment plan below Treatment frequency: Min 1x/week Treatment duration: 1 week Recommendations Recommendations for follow up therapy are one component of a multi-disciplinary discharge planning process, led by the attending physician.  Recommendations may be updated based on patient status, additional functional criteria and insurance authorization. Assessment: Orofacial Exam: Orofacial Exam Oral Cavity: Oral Hygiene: WFL Oral Cavity - Dentition: Edentulous Anatomy: Anatomy: WFL; Suspected cervical osteophytes Boluses Administered: Boluses Administered Boluses Administered: Thin liquids (Level 0); Puree; Solid; Mildly thick liquids (Level 2, nectar thick); Moderately thick liquids (Level 3, honey thick)  Oral Impairment Domain: Oral Impairment Domain Lip Closure: Interlabial escape, no progression to anterior lip Tongue control during bolus hold: Cohesive bolus between tongue to palatal seal Bolus preparation/mastication: Slow prolonged chewing/mashing with complete recollection Bolus transport/lingual motion: Brisk tongue motion Oral residue: Complete oral clearance Location of oral residue : N/A Initiation of pharyngeal swallow : Pyriform sinuses  Pharyngeal Impairment Domain: Pharyngeal  Impairment Domain Soft palate elevation: No bolus between soft palate (SP)/pharyngeal wall (PW) Laryngeal elevation: Complete superior movement of thyroid cartilage with complete approximation of arytenoids to epiglottic petiole Anterior hyoid excursion: Complete anterior movement Epiglottic movement: Complete inversion Laryngeal vestibule closure: Complete, no air/contrast in laryngeal vestibule Pharyngeal stripping wave : Present - complete Pharyngoesophageal segment opening: Complete distension and complete duration, no obstruction of flow  Tongue base retraction: No contrast between tongue base and posterior pharyngeal wall (PPW) Pharyngeal residue: Complete pharyngeal clearance  Esophageal Impairment Domain: No data recorded Pill: No data recorded Penetration/Aspiration Scale Score: Penetration/Aspiration Scale Score 1.  Material does not enter airway: Thin liquids (Level 0); Mildly thick liquids (Level 2, nectar thick); Moderately thick liquids (Level 3, honey thick); Puree; Solid Compensatory Strategies: No data recorded  General Information: Caregiver present: No  Diet Prior to this Study: NPO   No data recorded  No data recorded  No data recorded  No data recorded Behavior/Cognition: Alert; Cooperative; Pleasant mood Self-Feeding Abilities: Needs assist with self-feeding Baseline vocal quality/speech: Normal Volitional Cough: Unable to elicit Volitional Swallow: Able to elicit Exam Limitations: No limitations Goal Planning: No data recorded No data recorded No data recorded No data recorded No data recorded Pain: Pain Assessment Pain Assessment: Faces Faces Pain Scale: 4 Pain Location: right knee Pain Descriptors / Indicators: Discomfort Pain Intervention(s): Limited activity within patient's tolerance; Repositioned End of Session: Start Time:SLP Start Time (ACUTE ONLY): 0945 Stop Time: SLP Stop Time (ACUTE ONLY): 1000 Time Calculation:SLP Time Calculation (min) (ACUTE ONLY): 15 min Charges: SLP Evaluations  $ SLP Speech Visit: 1 Visit SLP Evaluations $BSS Swallow: 1 Procedure $MBS Swallow: 1 Procedure SLP visit diagnosis: SLP Visit Diagnosis: Dysphagia, unspecified (R13.10) Past Medical History: Past Medical History: Diagnosis Date  Allergy to bee sting   Anxiety   Arthritis   "right knee" (05/12/2016)  CAD (coronary artery disease)   a. DES to LCx 04/2016. b. DES to prox Cx 08/2017. c. Stable cath 09/2018 with mild nonobstructive residual disease, normal EF.  Headache   "when his sugar goes too high or too low" (05/12/2016)  HTN (hypertension)   Hyperlipidemia   Hypothyroidism   Macular degeneration   Obesity   PAF (paroxysmal atrial fibrillation) (HCC)   Type II diabetes mellitus (HCC)  Past Surgical History: Past Surgical History: Procedure Laterality Date  CARDIAC CATHETERIZATION    COLONOSCOPY    CORONARY ANGIOPLASTY WITH STENT PLACEMENT  05/12/2016  CORONARY STENT INTERVENTION N/A 05/12/2016  Procedure: Coronary Stent Intervention;  Surgeon: Corky Crafts, MD;  Location: [facility] INVASIVE CV LAB;  Service: Cardiovascular;  Laterality: N/A;  CORONARY STENT INTERVENTION N/A 08/31/2017  Procedure: CORONARY STENT INTERVENTION;  Surgeon: Corky Crafts, MD;  Location: [facility] INVASIVE CV LAB;  Service: Cardiovascular;  Laterality: N/A;  GANGLION CYST EXCISION Left 1980s  KNEE ARTHROSCOPY Right   Meniscus removal  LEFT HEART CATH AND CORONARY ANGIOGRAPHY N/A 05/12/2016  Procedure: Left Heart Cath and Coronary Angiography;  Surgeon: Corky Crafts, MD;  Location: [facility] INVASIVE CV LAB;  Service: Cardiovascular;  Laterality: N/A;  LEFT HEART CATH AND CORONARY ANGIOGRAPHY N/A 08/31/2017  Procedure: LEFT HEART CATH AND CORONARY ANGIOGRAPHY;  Surgeon: Corky Crafts, MD;  Location: [facility] INVASIVE CV LAB;  Service: Cardiovascular;  Laterality: N/A;  LEFT HEART CATHETERIZATION WITH CORONARY ANGIOGRAM N/A 02/04/2012  Procedure: LEFT HEART CATHETERIZATION WITH CORONARY ANGIOGRAM;  Surgeon: Kathleene Hazel, MD;  Location: [facility]  CATH LAB;  Service: Cardiovascular;  Laterality: N/A;  POLYPECTOMY    RIGHT/LEFT HEART CATH AND CORONARY ANGIOGRAPHY N/A 09/28/2018  Procedure: RIGHT/LEFT HEART CATH AND CORONARY ANGIOGRAPHY;  Surgeon: Kathleene Hazel, MD;  Location: [facility] INVASIVE CV LAB;  Service: Cardiovascular;  Laterality: N/A; [place], Riley Nearing 08/13/2022, 10:38 AM   CT ANGIO HEAD NECK W WO CM   Result Date: 08/12/2022 CLINICAL DATA:  Stroke/TIA, determine embolic source EXAM: CT ANGIOGRAPHY HEAD AND NECK WITH  AND WITHOUT CONTRAST TECHNIQUE: Multidetector CT imaging of the head and neck was performed using the standard protocol during bolus administration of intravenous contrast. Multiplanar CT image reconstructions and MIPs were obtained to evaluate the vascular anatomy. Carotid stenosis measurements (when applicable) are obtained utilizing NASCET criteria, using the distal internal carotid diameter as the denominator. RADIATION DOSE REDUCTION: This exam was performed according to the departmental dose-optimization program which includes automated exposure control, adjustment of the mA and/or kV according to patient size and/or use of iterative reconstruction technique. CONTRAST:  75mL OMNIPAQUE IOHEXOL 350 MG/ML SOLN COMPARISON:  Same day MRI head. FINDINGS: CT HEAD FINDINGS Brain: No acute infarcts better characterized on same day MRI head. No progressive mass effect or acute hemorrhage. Chronic microvascular ischemic disease. No midline shift, mass lesion or hydrocephalus. Vascular: See below. Skull: No acute fracture. Sinuses/Orbits: Clear sinuses.  No acute orbital findings. Other: No mastoid effusions. Review of the MIP images confirms the above findings CTA NECK FINDINGS Aortic arch: Great vessel origins are patent without significant stenosis. Calcific atherosclerosis. Right carotid system: Atherosclerosis at the carotid bifurcation with approximately 40% stenosis. Left carotid system: Atherosclerosis of the carotid  bifurcation without greater than 50% stenosis Vertebral arteries: Focal occlusion versus severe/critical stenosis of the left vertebral artery origin with more distal opacification. Moderate right vertebral artery origin stenosis. Remainder of the vertebral arteries are patent bilaterally. Skeleton: No acute abnormality limited assessment. Other neck: No acute abnormality on limited assessment. Upper chest: Visualized lung apices are clear. Review of the MIP images confirms the above findings CTA HEAD FINDINGS Anterior circulation: Bilateral intracranial ICAs, MCAs and ACAs are patent without proximal hemodynamically significant stenosis. Posterior circulation: Bilateral intradural vertebral arteries, basilar artery and bilateral posterior cerebral arteries are patent without proximally normally in stenosis. Venous sinuses: As permitted by contrast timing, patent. Review of the MIP images confirms the above findings IMPRESSION: 1. Focal occlusion versus severe/critical stenosis of the left vertebral artery origin with more distal opacification. 2. Moderate right vertebral artery origin stenosis. 3. Approximately 40% stenosis of the right ICA origin in the neck. 4.  Aortic Atherosclerosis (ICD10-I70.0). Electronically Signed   By: Feliberto Harts M.D.   On: 08/12/2022 14:40    MR BRAIN WO CONTRAST   Result Date: 08/12/2022 CLINICAL DATA:  Provided history: Neuro deficit, acute, stroke suspected. Fall. Altered mental status. EXAM: MRI HEAD WITHOUT CONTRAST TECHNIQUE: Multiplanar, multiecho pulse sequences of the brain and surrounding structures were obtained without intravenous contrast. COMPARISON:  Head CT 08/11/2022. FINDINGS: Intermittently motion degraded examination. Most notably, the sagittal T1-weighted sequence is severely motion degraded. Within this limitation, findings are as follows. Brain: Mild-to-moderate generalized cerebral atrophy. 16 x 8 mm acute infarct within the left aspect of the pons  (series 2, image 14). 6 mm acute infarct within the right thalamus (series 2, image 24). 7 mm acute infarct within the left thalamus (series 2, image 23). 3 mm acute cortical infarct within the inferomedial left parietal lobe (series 3, image 13). 6 mm acute infarct within the right parietal white matter (series 2, image 30) (series 3, image 11). 3 mm acute/early subacute infarct within the mid-to-posterior left frontal lobe white matter (series 2, image 33) (series 3, image 21). Chronic lacunar infarcts within the bilateral corona radiata and thalami. Background moderate multifocal T2 FLAIR hyperintense signal abnormality within the cerebral white matter, nonspecific but compatible with chronic small vessel ischemic disease. No evidence of an intracranial mass. No extra-axial fluid collection. No midline shift. Vascular: Maintained flow  voids within the proximal large arterial vessels. Skull and upper cervical spine: No focal suspicious marrow lesion. Sinuses/Orbits: No mass or acute finding within the imaged orbits. No significant paranasal sinus disease. IMPRESSION: 1. Motion degraded examination. 2. 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 The largest infarct is located within the left aspect of the pons, measuring 16 x 8 mm. Additionally, there is a 3 mm acute/early subacute infarct within the mid-to-posterior left frontal lobe white matter. Involvement of multiple vascular territories, suspicious for an embolic process. 3. Background parenchymal atrophy, chronic small vessel ischemic disease and chronic lacunar infarcts as described. Electronically Signed   By: Jackey Loge D.O.   On: 08/12/2022 10:20    CT Foot Left Wo Contrast   Result Date: 08/12/2022 CLINICAL DATA:  Foot trauma, occult fracture suspected. EXAM: CT OF THE LEFT FOOT WITHOUT CONTRAST TECHNIQUE: Multidetector CT imaging of the left foot was performed according to the standard  protocol. Multiplanar CT image reconstructions were also generated. RADIATION DOSE REDUCTION: This exam was performed according to the departmental dose-optimization program which includes automated exposure control, adjustment of the mA and/or kV according to patient size and/or use of iterative reconstruction technique. COMPARISON:  Radiographs dated Aug 11, 2022 FINDINGS: Bones/Joint/Cartilage There is minimal cortical irregularity about the lateral aspect of the proximal phalanx of the fifth digit which could be due to osseous remodeling or a nondisplaced fracture. There is generalized osteopenia. Mild arthritic changes of the tibiotalar and subtalar joints with marginal spurring. No appreciable fracture of the calcaneus. Ligaments Suboptimally assessed by CT. Muscles and Tendons Muscles are normal in bulk. Tendons of the flexor and extensor compartments appear intact. Mild Achilles enthesopathy. Soft tissues Skin and subcutaneous soft tissues are within normal limits. No fluid collection or hematoma. IMPRESSION: 1. Minimal cortical irregularity about the lateral aspect of the proximal phalanx of the fifth digit which could be due to osseous remodeling or a nondisplaced fracture. 2. Generalized osteopenia. 3. Mild arthritic changes of the tibiotalar and subtalar joints. Electronically Signed   By: Larose Hires D.O.   On: 08/12/2022 09:56    DG Ribs Bilateral W/Chest   Result Date: 08/11/2022 CLINICAL DATA:  Fall, left rib pain EXAM: BILATERAL RIBS AND CHEST - 9 VIEW COMPARISON:  Chest radiograph 08/10/2022 FINDINGS: Atherosclerotic calcification of the aortic arch. Thoracic spondylosis noted. Cardiac and mediastinal margins appear normal. No pneumothorax. The lungs appear clear. No blunting of the costophrenic angles. Subtle irregularity of the right right anterior ninth rib on 1 of the oblique projections, cannot exclude subtle fracture. IMPRESSION: 1. Subtle irregularity of the right anterior ninth rib on  one of the oblique projections, cannot exclude a subtle fracture. No other potential rib fractures are detected. 2. Thoracic spondylosis. 3.  Aortic Atherosclerosis (ICD10-I70.0). Electronically Signed   By: Gaylyn Rong M.D.   On: 08/11/2022 15:13    DG Foot Complete Left   Result Date: 08/11/2022 CLINICAL DATA:  Fall, lateral foot pain EXAM: LEFT FOOT - COMPLETE 3+ VIEW COMPARISON:  None Available. FINDINGS: There is mild cortical irregularity in the lateral metaphysis of the proximal phalanx small toe which could be an indicator of a relatively nondisplaced fracture. The finding is subtle and medial extension is poorly defined. Correlate with point tenderness in this vicinity. Bony demineralization and mild cortical indistinctness along portions of the hindfoot including the posterior subtalar joint and anterior calcaneal process. Linear calcifications in the distal Achilles tendon. Mild cortical irregularity  along the dorsal talar neck, probably from spurring. IMPRESSION: 1. Subtle cortical irregularity in the lateral metaphysis of the proximal phalanx small toe could be an indicator of a relatively nondisplaced fracture. Correlate with point tenderness in this vicinity. 2. Bony demineralization and mild cortical indistinctness along portions of the hindfoot. If the patient is unable to bear weight or if there is a high clinical suspicion of hindfoot injury, dedicated CT of the foot should be considered. 3. Linear calcifications in the distal Achilles tendon compatible with mild calcific tendinopathy. Electronically Signed   By: Gaylyn Rong M.D.   On: 08/11/2022 15:08    CT Head Wo Contrast   Result Date: 08/11/2022 CLINICAL DATA:  Fall, altered mental status EXAM: CT HEAD WITHOUT CONTRAST CT CERVICAL SPINE WITHOUT CONTRAST TECHNIQUE: Multidetector CT imaging of the head and cervical spine was performed following the standard protocol without intravenous contrast. Multiplanar CT image  reconstructions of the cervical spine were also generated. RADIATION DOSE REDUCTION: This exam was performed according to the departmental dose-optimization program which includes automated exposure control, adjustment of the mA and/or kV according to patient size and/or use of iterative reconstruction technique. COMPARISON:  08/10/2022 FINDINGS: CT HEAD FINDINGS Brain: No evidence of acute infarction, hemorrhage, hydrocephalus, extra-axial collection or mass lesion/mass effect. Periventricular and deep white matter hypodensity. Vascular: No hyperdense vessel or unexpected calcification. Skull: Normal. Negative for fracture or focal lesion. Sinuses/Orbits: No acute finding. Other: None. CT CERVICAL SPINE FINDINGS Alignment: Degenerative straightening of the normal cervical lordosis. Skull base and vertebrae: No acute fracture. No primary bone lesion or focal pathologic process. Soft tissues and spinal canal: No prevertebral fluid or swelling. No visible canal hematoma. Disc levels: Moderate multilevel disc space height loss and osteophytosis. Upper chest: Negative. Other: None. IMPRESSION: 1. No acute intracranial pathology. Small-vessel white matter disease. 2. No fracture or static subluxation of the cervical spine. 3. Moderate multilevel cervical disc degenerative disease. Electronically Signed   By: Jearld Lesch M.D.   On: 08/11/2022 14:59    CT Cervical Spine Wo Contrast   Result Date: 08/11/2022 CLINICAL DATA:  Fall, altered mental status EXAM: CT HEAD WITHOUT CONTRAST CT CERVICAL SPINE WITHOUT CONTRAST TECHNIQUE: Multidetector CT imaging of the head and cervical spine was performed following the standard protocol without intravenous contrast. Multiplanar CT image reconstructions of the cervical spine were also generated. RADIATION DOSE REDUCTION: This exam was performed according to the departmental dose-optimization program which includes automated exposure control, adjustment of the mA and/or kV  according to patient size and/or use of iterative reconstruction technique. COMPARISON:  08/10/2022 FINDINGS: CT HEAD FINDINGS Brain: No evidence of acute infarction, hemorrhage, hydrocephalus, extra-axial collection or mass lesion/mass effect. Periventricular and deep white matter hypodensity. Vascular: No hyperdense vessel or unexpected calcification. Skull: Normal. Negative for fracture or focal lesion. Sinuses/Orbits: No acute finding. Other: None. CT CERVICAL SPINE FINDINGS Alignment: Degenerative straightening of the normal cervical lordosis. Skull base and vertebrae: No acute fracture. No primary bone lesion or focal pathologic process. Soft tissues and spinal canal: No prevertebral fluid or swelling. No visible canal hematoma. Disc levels: Moderate multilevel disc space height loss and osteophytosis. Upper chest: Negative. Other: None. IMPRESSION: 1. No acute intracranial pathology. Small-vessel white matter disease. 2. No fracture or static subluxation of the cervical spine. 3. Moderate multilevel cervical disc degenerative disease. Electronically Signed   By: Jearld Lesch M.D.   On: 08/11/2022 14:59           Blood pressure 115/75, pulse 69, temperature  97.8 F (36.6 C), temperature source Oral, resp. rate 16, height 5\' 5"  (1.651 m), weight 95 kg, SpO2 94 %.   Medical Problem List and Plan: 1. Functional deficits secondary to left pontine and bilateral thalamic, parietal embolic strokes             -patient may shower             -ELOS/Goals: 10 to 14 days, supervision goals PT/OT/SLP   2.  Antithrombotics: -DVT/anticoagulation:  Eliquis             -antiplatelet therapy: aspirin    3. Pain Management: Tylenol, Robaxin as needed   4. Mood/Behavior/Sleep: LCSW to evaluate and provide emotional support             -continue Lexapro             -antipsychotic agents: n/a   5. Neuropsych/cognition: This patient is not capable of making decisions on his own behalf.   6. Skin/Wound  Care: Routine skin care checks   7. Fluids/Electrolytes/Nutrition/aphasia: Routine Is and Os and follow-up chemistries -On modified diet, SLP eval pending   8: Hypertension: monitor TID and prn; at home on tenoretic             -continue atenolol 50 mg daily   9: Hyperlipidemia: continue statin, Zetia   10: CAD s/p DES, 2018, 2019             -continue aspirin and statin, Zetia, BB   11: pAF: continue Xarelto and BB   12: DM-2: at home on metformin 1000 mg BID, Humalog 75/25 40 units, Ozempic             -continue SSI, monitor intake, CBGs QID  -Add carb controlled to dysphagia diet   13: Tobacco use: cessation counseling; continue Nicoderm   14: Hypothyroidism: continue Synthroid   15: Obesity: dietary/exercise counseling    16: Left foot pain: possible non-displaced fracture vs. osseous remolding 5th toe             -continue Darco shoe, buddy wrap  Milinda Antis, PA-C 08/13/2022  I have examined the patient independently and edited the note for HPI, ROS, exam, assessment, and plan as appropriate. I am in agreement with the above recommendations.   Angelina Sheriff, DO 08/13/2022

## 2022-08-13 NOTE — TOC Transition Note (Signed)
Transition of Care [facility]) - CM/SW Discharge Note   Patient Details  Name: Anthony Woods MRN: [id number] Date of Birth: 12-16-1955  [facility] CM/SW Contact:  Lockie Pares, RN Phone Number: 08/13/2022, 10:28 AM   Clinical Narrative:     Patient will DC to CIR after testing.   Final next level of care: IP Rehab Facility Barriers to Discharge: No Barriers Identified   Patient Goals and CMS Choice      Discharge Placement    CIR                     Discharge Plan and Services Additional resources added to the After Visit Summary for     Discharge Planning Services: CM Consult Post Acute Care Choice: IP Rehab                               Social Determinants of Health (SDOH) Interventions SDOH Screenings   Depression (PHQ2-9): Low Risk  (10/04/2019)  Tobacco Use: High Risk (08/12/2022)     Readmission Risk Interventions     No data to display

## 2022-08-13 NOTE — Evaluation (Signed)
Speech Language Pathology Evaluation Patient Details Name: Anthony Woods MRN: [id number] DOB: April 24, 1955 Today's Date: 08/13/2022 Time: 0945-1000 SLP Time Calculation (min) (ACUTE ONLY): 15 min  Problem List:  Patient Active Problem List   Diagnosis Date Noted   Acute CVA (cerebrovascular accident) (HCC) 08/12/2022   Dyslipidemia 08/12/2022   Noncompliance with medication regimen 08/12/2022   DNR (do not resuscitate) 08/12/2022   Knee strain, right, initial encounter 08/27/2020   Carotid bruit 05/22/2020   COPD (chronic obstructive pulmonary disease) (HCC) 05/22/2020   Neoplasm of uncertain behavior of skin 01/10/2020   Right knee pain 05/01/2019   DOE (dyspnea on exertion)    Chest pain 09/08/2018   Wrist pain, acute, left 04/24/2018   Angina pectoris, unspecified (HCC)    Stress at work 12/15/2016   Tobacco dependence 06/04/2016   Atrial fibrillation with RVR (HCC) 05/12/2016   Elevated troponin    Atrial fibrillation (HCC) 05/11/2016   Allergic rhinitis 08/12/2015   Chest pain with moderate risk of acute coronary syndrome 04/14/2015   Urinary frequency 04/14/2015   Osteoarthritis of right knee 12/09/2014   Snoring 09/25/2013   Rash and nonspecific skin eruption 08/08/2012   Insomnia 08/08/2012   Class 1 obesity due to excess calories with body mass index (BMI) of 34.0 to 34.9 in adult 06/10/2011   Well adult exam 06/10/2011   Cough due to angiotensin-converting enzyme inhibitor 10/08/2010   SHOULDER PAIN 03/09/2010   NECK PAIN 03/09/2010   BRONCHITIS, ACUTE 12/13/2007   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   Hypothyroidism 04/04/2007   Diabetes mellitus type 2 in obese 04/04/2007   Essential hypertension 04/04/2007   Coronary atherosclerosis 04/04/2007   Edema 04/04/2007   Past Medical History:  Past Medical History:  Diagnosis Date   Allergy to bee sting    Anxiety    Arthritis    "right knee" (05/12/2016)   CAD (coronary artery disease)    a. DES to LCx  04/2016. b. DES to prox Cx 08/2017. c. Stable cath 09/2018 with mild nonobstructive residual disease, normal EF.   Headache    "when his sugar goes too high or too low" (05/12/2016)   HTN (hypertension)    Hyperlipidemia    Hypothyroidism    Macular degeneration    Obesity    PAF (paroxysmal atrial fibrillation) (HCC)    Type II diabetes mellitus (HCC)    Past Surgical History:  Past Surgical History:  Procedure Laterality Date   CARDIAC CATHETERIZATION     COLONOSCOPY     CORONARY ANGIOPLASTY WITH STENT PLACEMENT  05/12/2016   CORONARY STENT INTERVENTION N/A 05/12/2016   Procedure: Coronary Stent Intervention;  Surgeon: Corky Crafts, MD;  Location: [facility] INVASIVE CV LAB;  Service: Cardiovascular;  Laterality: N/A;   CORONARY STENT INTERVENTION N/A 08/31/2017   Procedure: CORONARY STENT INTERVENTION;  Surgeon: Corky Crafts, MD;  Location: [facility] INVASIVE CV LAB;  Service: Cardiovascular;  Laterality: N/A;   GANGLION CYST EXCISION Left 1980s   KNEE ARTHROSCOPY Right    Meniscus removal   LEFT HEART CATH AND CORONARY ANGIOGRAPHY N/A 05/12/2016   Procedure: Left Heart Cath and Coronary Angiography;  Surgeon: Corky Crafts, MD;  Location: [facility] INVASIVE CV LAB;  Service: Cardiovascular;  Laterality: N/A;   LEFT HEART CATH AND CORONARY ANGIOGRAPHY N/A 08/31/2017   Procedure: LEFT HEART CATH AND CORONARY ANGIOGRAPHY;  Surgeon: Corky Crafts, MD;  Location: [facility] INVASIVE CV LAB;  Service: Cardiovascular;  Laterality: N/A;   LEFT HEART CATHETERIZATION WITH CORONARY ANGIOGRAM  N/A 02/04/2012   Procedure: LEFT HEART CATHETERIZATION WITH CORONARY ANGIOGRAM;  Surgeon: Kathleene Hazel, MD;  Location: [facility] CATH LAB;  Service: Cardiovascular;  Laterality: N/A;   POLYPECTOMY     RIGHT/LEFT HEART CATH AND CORONARY ANGIOGRAPHY N/A 09/28/2018   Procedure: RIGHT/LEFT HEART CATH AND CORONARY ANGIOGRAPHY;  Surgeon: Kathleene Hazel, MD;  Location: [facility] INVASIVE CV LAB;  Service:  Cardiovascular;  Laterality: N/A;   HPI:  The pt is a 67 yo male presenting 5/22 with increased confusion and frequent falls.     Acute infarcts within the left aspect of the pons, bilateral  thalami and bilateral parietal lobes. The largest infarct is located  within the left aspect of the pons, measuring 16 x 8 mm.  Additionally, there is a 3 mm acute/early subacute infarct within  the mid-to-posterior left frontal lobe white matter. Involvement of  multiple vascular territories, suspicious for an embolic process.   Assessment / Plan / Recommendation Clinical Impression  Pt demonstrates a moderate dysarthria, likely a general flaccid dysarthria given absence of focal lingual weakness. Pt with slow, slightly imprecise articulation. Already improving with slow, loud, overarticulated cues at word level. Short term memory also impaired. Awareness and use of compensatory strategies may be impacted by this. Will continue interventions acutely, recommend AIR at d/c.    SLP Assessment  SLP Recommendation/Assessment: Patient needs continued Speech Lanaguage Pathology Services SLP Visit Diagnosis: Dysarthria and anarthria (R47.1)    Recommendations for follow up therapy are one component of a multi-disciplinary discharge planning process, led by the attending physician.  Recommendations may be updated based on patient status, additional functional criteria and insurance authorization.    Follow Up Recommendations       Assistance Recommended at Discharge     Functional Status Assessment    Frequency and Duration min 2x/week  2 weeks      SLP Evaluation Cognition  Overall Cognitive Status: Impaired/Different from baseline Arousal/Alertness: Awake/alert Orientation Level: Oriented to person;Oriented to place;Oriented to situation Attention: Selective Selective Attention: Appears intact Memory: Impaired Memory Impairment: Decreased short term memory Decreased Short Term Memory: Verbal basic        Comprehension  Auditory Comprehension Overall Auditory Comprehension: Appears within functional limits for tasks assessed    Expression Verbal Expression Overall Verbal Expression: Appears within functional limits for tasks assessed   Oral / Motor  Oral Motor/Sensory Function Overall Oral Motor/Sensory Function: Within functional limits Motor Speech Overall Motor Speech: Impaired Phonation: Normal Resonance: Hypernasality Articulation: Impaired Level of Impairment: Word Intelligibility: Intelligibility reduced Word: 25-49% accurate Phrase: 25-49% accurate Sentence: 25-49% accurate Conversation: 25-49% accurate Motor Planning: Witnin functional limits Motor Speech Errors: Aware;Consistent            Neria Procter, Riley Nearing 08/13/2022, 11:00 AM

## 2022-08-13 NOTE — Progress Notes (Signed)
Inpatient Rehabilitation Admissions Coordinator   I met at bedside with patient and wife. Also consulted Dr Rito Ehrlich and Dr Pearlean Brownie. After echo complete today, patient to be discharged to CIR today. I will make the arrangements. Echo staff entering the room as I left.  Ottie Glazier, RN, MSN Rehab Admissions Coordinator [phone] 08/13/2022 10:38 AM

## 2022-08-13 NOTE — Discharge Summary (Signed)
[facility]  Physician Discharge Summary   Patient ID: Anthony Woods MRN: [id number] DOB/AGE: [birth year] 67 y.o.  Admit date: 08/11/2022 Discharge date: 08/13/2022    PCP: Tresa Garter, MD  DISCHARGE DIAGNOSES:    Acute CVA (cerebrovascular accident) [facility])   Hypothyroidism   Diabetes mellitus type 2 in obese   Essential hypertension   Class 1 obesity due to excess calories with body mass index (BMI) of 34.0 to 34.9 in adult   Atrial fibrillation (HCC)   Dyslipidemia   RECOMMENDATIONS FOR OUTPATIENT FOLLOW UP: Patient discharged to [facility] inpatient rehabilitation.  CODE STATUS: DNR  DISCHARGE CONDITION: fair   INITIAL HISTORY: 67 y.o. male with medical history significant of CAD s/p stent; HTN; HLD; afib on Xarelto; DM; and hypothyroidism who presented on 5/21 with a fall, complaining of dizziness.  Imaging showed a possible medial epicondyle fracture but CT was negative as well as head and neck CTs.  His wife requested to take him home.  He returned on 5/22 with increased confusion and falls as well as urinary symptoms..  Patient was hospitalized for further management.    Consultants: Neurology   Procedures: Echocardiogram   HOSPITAL COURSE:   Acute stroke Appears to be embolic stroke in the setting of known atrial fibrillation.  Apparently there has been medication noncompliance. Patient seen by neurology.   Underwent CT angiogram head and neck.  Will defer findings to neurology. LDL is 128.  Patient noted to be on Zetia and Crestor. HbA1c is greater than 15.5. Echocardiogram was completed and shows EF to be 60 to 65%. PT and OT evaluation.  Inpatient rehabilitation is recommended.   Chronic atrial fibrillation Compliance has been an issue.  Supposed to be on atenolol at home which is currently on hold. Rivaroxaban was resumed. Informed by pharmacy that Apixaban will be better covered by patient's insurance. Will change to apixaban.    Essential hypertension Permissive hypertension being allowed.  Prior to admission he was on atenolol, HCTZ, amlodipine and losartan.  Also noted to be on Imdur.   Hyperlipidemia Continue Crestor and Zetia.  Has likely been noncompliant because his LDL is noted to be 128.  Need to recheck lipid panel in a few weeks and adjust medications if levels are still high.   Left foot pain with concern for nondisplaced fracture involving the proximal phalanx of the fifth digit. He underwent CT of the foot which raised concern for fracture.  Patient was placed on postop shoe and his fourth and fifth toes were taped together.  He will need repeat films in a few weeks.   Diabetes mellitus type 2, uncontrolled with hyperglycemia HbA1c is greater than 15.5.  Can resume metformin   Hypothyroidism TSH is 29.6.  Likely noncompliant.  Levothyroxine was resumed.  Will need to recheck thyroid function tests in 2 to 3 weeks.   Tobacco dependence Counseling.   Obesity Estimated body mass index is 34.85 kg/m as calculated from the following:   Height as of this encounter: 5\' 5"  (1.651 m).   Weight as of this encounter: 95 kg.     PERTINENT LABS:  The results of significant diagnostics from this hospitalization (including imaging, microbiology, ancillary and laboratory) are listed below for reference.     Labs:   Basic Metabolic Panel: Recent Labs  Lab 08/10/22 1457 08/11/22 1417 08/11/22 1432 08/11/22 2014 08/12/22 1106 08/14/22 0525  NA 133* 135 135 134* 135 138  K 3.7 3.6 3.7 3.9 3.9 3.3*  CL  96* 99  --  97* 98 99  CO2 23 23  --  24 22 27   GLUCOSE 491* 338*  --  443* 316* 225*  BUN 8 5*  --  8 17 21   CREATININE 0.84 0.87  --  0.96 1.14 0.84  CALCIUM 10.1 9.7  --  9.8 10.5* 10.1   Liver Function Tests: Recent Labs  Lab 08/11/22 1417 08/14/22 0525  AST 18 23  ALT 16 17  ALKPHOS 97 77  BILITOT 1.2 0.8  PROT 6.7 6.1*  ALBUMIN 3.8 3.5    Recent Labs  Lab 08/12/22 1426   AMMONIA 25   CBC: Recent Labs  Lab 08/10/22 1457 08/11/22 1417 08/11/22 1432 08/14/22 0525  WBC 6.9 8.3  --  7.8  NEUTROABS  --  5.9  --  4.6  HGB 16.2 16.4 16.3 16.0  HCT 47.0 47.5 48.0 46.1  MCV 91.4 90.5  --  91.7  PLT 219 232  --  214   Cardiac Enzymes: Recent Labs  Lab 08/12/22 1106  CKTOTAL 199     CBG: Recent Labs  Lab 08/13/22 0607 08/13/22 1206 08/13/22 1717 08/13/22 2129 08/14/22 0631  GLUCAP 189* 197* 233* 288* 246*     IMAGING STUDIES ECHOCARDIOGRAM COMPLETE  Result Date: 08/13/2022    ECHOCARDIOGRAM REPORT   Patient Name:   Anthony Woods Date of Exam: 08/13/2022 Medical Rec #:  [id number]      Height:       65.0 in Accession #:    [id number]     Weight:       209.4 lb Date of Birth:  1955-10-12      BSA:          2.017 m Patient Age:    67 years       BP:           115/75 mmHg Patient Gender: M              HR:           85 bpm. Exam Location:  Inpatient Procedure: 2D Echo, Cardiac Doppler and Color Doppler Indications:    Stroke I63.9  History:        Patient has prior history of Echocardiogram examinations, most                 recent 09/08/2018. Angina, Stroke and COPD, Arrythmias:Atrial                 Fibrillation, Signs/Symptoms:Chest Pain and Dyspnea; Risk                 Factors:Hypertension, Diabetes and Current Smoker.  Sonographer:    Lucendia Herrlich Referring Phys: [id number] JENNIFER YATES  Sonographer Comments: Image acquisition challenging due to uncooperative patient. IMPRESSIONS  1. Technically difficult study with limited visualization of cardiac structures.  2. Left ventricular ejection fraction, by estimation, is 60 to 65%. The left ventricle has normal function. The left ventricle has no regional wall motion abnormalities. There is mild concentric left ventricular hypertrophy. Left ventricular diastolic function could not be evaluated.  3. Right ventricular systolic function is normal. The right ventricular size is normal.  4. The mitral valve is  normal in structure. No evidence of mitral valve regurgitation. No evidence of mitral stenosis.  5. The aortic valve is normal in structure. Aortic valve regurgitation is not visualized. No aortic stenosis is present.  6. The inferior vena cava is normal in size with greater than 50% respiratory  variability, suggesting right atrial pressure of 3 mmHg. FINDINGS  Left Ventricle: Left ventricular ejection fraction, by estimation, is 60 to 65%. The left ventricle has normal function. The left ventricle has no regional wall motion abnormalities. The left ventricular internal cavity size was normal in size. There is  mild concentric left ventricular hypertrophy. Left ventricular diastolic function could not be evaluated. Right Ventricle: The right ventricular size is normal. No increase in right ventricular wall thickness. Right ventricular systolic function is normal. Left Atrium: Left atrial size was normal in size. Right Atrium: Right atrial size was normal in size. Pericardium: There is no evidence of pericardial effusion. Mitral Valve: The mitral valve is normal in structure. No evidence of mitral valve regurgitation. No evidence of mitral valve stenosis. Tricuspid Valve: The tricuspid valve is normal in structure. Tricuspid valve regurgitation is not demonstrated. No evidence of tricuspid stenosis. Aortic Valve: The aortic valve is normal in structure. Aortic valve regurgitation is not visualized. No aortic stenosis is present. Pulmonic Valve: The pulmonic valve was not well visualized. Pulmonic valve regurgitation is not visualized. No evidence of pulmonic stenosis. Aorta: The aortic root is normal in size and structure. Venous: The inferior vena cava is normal in size with greater than 50% respiratory variability, suggesting right atrial pressure of 3 mmHg. IAS/Shunts: No atrial level shunt detected by color flow Doppler.  LEFT VENTRICLE PLAX 2D LVIDd:         4.30 cm LVIDs:         3.10 cm LV PW:         1.20 cm  LV IVS:        1.30 cm LVOT diam:     2.30 cm LVOT Area:     4.15 cm  IVC IVC diam: 1.60 cm LEFT ATRIUM         Index LA diam:    3.80 cm 1.88 cm/m   AORTA Ao Root diam: 3.60 cm MITRAL VALVE MV Area (PHT): 2.87 cm    SHUNTS MV Decel Time: 264 msec    Systemic Diam: 2.30 cm MV E velocity: 48.70 cm/s MV A velocity: 65.60 cm/s MV E/A ratio:  0.74 Aditya Sabharwal Electronically signed by Dorthula Nettles Signature Date/Time: 08/13/2022/1:41:51 PM    Final    DG Swallowing Func-Speech Pathology  Result Date: 08/13/2022 Table formatting from the original result was not included. Modified Barium Swallow Study Patient Details Name: ROMIE MORROW MRN: [id number] Date of Birth: [birth year] Today's Date: 08/13/2022 HPI/PMH: HPI: The pt is a 67 yo male presenting 5/22 with increased confusion and frequent falls.     Acute infarcts within the left aspect of the pons, bilateral  thalami and bilateral parietal lobes. The largest infarct is located  within the left aspect of the pons, measuring 16 x 8 mm.  Additionally, there is a 3 mm acute/early subacute infarct within  the mid-to-posterior left frontal lobe white matter. Involvement of  multiple vascular territories, suspicious for an embolic process. Clinical Impression: Pt demonstrates mild oropharyngeal dysphagia, noted to have a slightly late swallow initaition at times, likely accounting for instances of cough seen in clinical swallow eval. However, today despite being pushed to drink consecutive large quantites via straw pt had no aspiration and no oral or pharyngeal weakness with residue. Pt has one tooth and no dentures and at baseline can masticate with or without dentures. He demonstrated this ability today. Also despite moderate dysarthria, there was no focal lingual weakness and no significant oral dysphagia. Pt  may resume a mechanical soft diet and thin liquids with aspiration precautions. SLP will check in on pts tolerance of meals at least once. Factors that  may increase risk of adverse event in presence of aspiration Rubye Oaks & Clearance Coots 2021): No data recorded Recommendations/Plan: Swallowing Evaluation Recommendations Swallowing Evaluation Recommendations Recommendations: PO diet PO Diet Recommendation: Dysphagia 3 (Mechanical soft); Thin liquids (Level 0) Liquid Administration via: Cup; Straw Medication Administration: Whole meds with liquid Supervision: Staff to assist with self-feeding Postural changes: Position pt fully upright for meals; Stay upright 30-60 min after meals Treatment Plan Treatment Plan Treatment recommendations: Therapy as outlined in treatment plan below Treatment frequency: Min 1x/week Treatment duration: 1 week Recommendations Recommendations for follow up therapy are one component of a multi-disciplinary discharge planning process, led by the attending physician.  Recommendations may be updated based on patient status, additional functional criteria and insurance authorization. Assessment: Orofacial Exam: Orofacial Exam Oral Cavity: Oral Hygiene: WFL Oral Cavity - Dentition: Edentulous Anatomy: Anatomy: WFL; Suspected cervical osteophytes Boluses Administered: Boluses Administered Boluses Administered: Thin liquids (Level 0); Puree; Solid; Mildly thick liquids (Level 2, nectar thick); Moderately thick liquids (Level 3, honey thick)  Oral Impairment Domain: Oral Impairment Domain Lip Closure: Interlabial escape, no progression to anterior lip Tongue control during bolus hold: Cohesive bolus between tongue to palatal seal Bolus preparation/mastication: Slow prolonged chewing/mashing with complete recollection Bolus transport/lingual motion: Brisk tongue motion Oral residue: Complete oral clearance Location of oral residue : N/A Initiation of pharyngeal swallow : Pyriform sinuses  Pharyngeal Impairment Domain: Pharyngeal Impairment Domain Soft palate elevation: No bolus between soft palate (SP)/pharyngeal wall (PW) Laryngeal elevation: Complete  superior movement of thyroid cartilage with complete approximation of arytenoids to epiglottic petiole Anterior hyoid excursion: Complete anterior movement Epiglottic movement: Complete inversion Laryngeal vestibule closure: Complete, no air/contrast in laryngeal vestibule Pharyngeal stripping wave : Present - complete Pharyngoesophageal segment opening: Complete distension and complete duration, no obstruction of flow Tongue base retraction: No contrast between tongue base and posterior pharyngeal wall (PPW) Pharyngeal residue: Complete pharyngeal clearance  Esophageal Impairment Domain: No data recorded Pill: No data recorded Penetration/Aspiration Scale Score: Penetration/Aspiration Scale Score 1.  Material does not enter airway: Thin liquids (Level 0); Mildly thick liquids (Level 2, nectar thick); Moderately thick liquids (Level 3, honey thick); Puree; Solid Compensatory Strategies: No data recorded  General Information: Caregiver present: No  Diet Prior to this Study: NPO   No data recorded  No data recorded  No data recorded  No data recorded Behavior/Cognition: Alert; Cooperative; Pleasant mood Self-Feeding Abilities: Needs assist with self-feeding Baseline vocal quality/speech: Normal Volitional Cough: Unable to elicit Volitional Swallow: Able to elicit Exam Limitations: No limitations Goal Planning: No data recorded No data recorded No data recorded No data recorded No data recorded Pain: Pain Assessment Pain Assessment: Faces Faces Pain Scale: 4 Pain Location: right knee Pain Descriptors / Indicators: Discomfort Pain Intervention(s): Limited activity within patient's tolerance; Repositioned End of Session: Start Time:SLP Start Time (ACUTE ONLY): 0945 Stop Time: SLP Stop Time (ACUTE ONLY): 1000 Time Calculation:SLP Time Calculation (min) (ACUTE ONLY): 15 min Charges: SLP Evaluations $ SLP Speech Visit: 1 Visit SLP Evaluations $BSS Swallow: 1 Procedure $MBS Swallow: 1 Procedure SLP visit diagnosis: SLP Visit  Diagnosis: Dysphagia, unspecified (R13.10) Past Medical History: Past Medical History: Diagnosis Date  Allergy to bee sting   Anxiety   Arthritis   "right knee" (05/12/2016)  CAD (coronary artery disease)   a. DES to LCx 04/2016. b. DES to prox Cx 08/2017.  c. Stable cath 09/2018 with mild nonobstructive residual disease, normal EF.  Headache   "when his sugar goes too high or too low" (05/12/2016)  HTN (hypertension)   Hyperlipidemia   Hypothyroidism   Macular degeneration   Obesity   PAF (paroxysmal atrial fibrillation) (HCC)   Type II diabetes mellitus (HCC)  Past Surgical History: Past Surgical History: Procedure Laterality Date  CARDIAC CATHETERIZATION    COLONOSCOPY    CORONARY ANGIOPLASTY WITH STENT PLACEMENT  05/12/2016  CORONARY STENT INTERVENTION N/A 05/12/2016  Procedure: Coronary Stent Intervention;  Surgeon: Corky Crafts, MD;  Location: [facility] INVASIVE CV LAB;  Service: Cardiovascular;  Laterality: N/A;  CORONARY STENT INTERVENTION N/A 08/31/2017  Procedure: CORONARY STENT INTERVENTION;  Surgeon: Corky Crafts, MD;  Location: [facility] INVASIVE CV LAB;  Service: Cardiovascular;  Laterality: N/A;  GANGLION CYST EXCISION Left 1980s  KNEE ARTHROSCOPY Right   Meniscus removal  LEFT HEART CATH AND CORONARY ANGIOGRAPHY N/A 05/12/2016  Procedure: Left Heart Cath and Coronary Angiography;  Surgeon: Corky Crafts, MD;  Location: [facility] INVASIVE CV LAB;  Service: Cardiovascular;  Laterality: N/A;  LEFT HEART CATH AND CORONARY ANGIOGRAPHY N/A 08/31/2017  Procedure: LEFT HEART CATH AND CORONARY ANGIOGRAPHY;  Surgeon: Corky Crafts, MD;  Location: [facility] INVASIVE CV LAB;  Service: Cardiovascular;  Laterality: N/A;  LEFT HEART CATHETERIZATION WITH CORONARY ANGIOGRAM N/A 02/04/2012  Procedure: LEFT HEART CATHETERIZATION WITH CORONARY ANGIOGRAM;  Surgeon: Kathleene Hazel, MD;  Location: [facility] CATH LAB;  Service: Cardiovascular;  Laterality: N/A;  POLYPECTOMY    RIGHT/LEFT HEART CATH AND CORONARY ANGIOGRAPHY N/A  09/28/2018  Procedure: RIGHT/LEFT HEART CATH AND CORONARY ANGIOGRAPHY;  Surgeon: Kathleene Hazel, MD;  Location: [facility] INVASIVE CV LAB;  Service: Cardiovascular;  Laterality: N/A; [place], Riley Nearing 08/13/2022, 10:38 AM  CT ANGIO HEAD NECK W WO CM  Result Date: 08/12/2022 CLINICAL DATA:  Stroke/TIA, determine embolic source EXAM: CT ANGIOGRAPHY HEAD AND NECK WITH AND WITHOUT CONTRAST TECHNIQUE: Multidetector CT imaging of the head and neck was performed using the standard protocol during bolus administration of intravenous contrast. Multiplanar CT image reconstructions and MIPs were obtained to evaluate the vascular anatomy. Carotid stenosis measurements (when applicable) are obtained utilizing NASCET criteria, using the distal internal carotid diameter as the denominator. RADIATION DOSE REDUCTION: This exam was performed according to the departmental dose-optimization program which includes automated exposure control, adjustment of the mA and/or kV according to patient size and/or use of iterative reconstruction technique. CONTRAST:  75mL OMNIPAQUE IOHEXOL 350 MG/ML SOLN COMPARISON:  Same day MRI head. FINDINGS: CT HEAD FINDINGS Brain: No acute infarcts better characterized on same day MRI head. No progressive mass effect or acute hemorrhage. Chronic microvascular ischemic disease. No midline shift, mass lesion or hydrocephalus. Vascular: See below. Skull: No acute fracture. Sinuses/Orbits: Clear sinuses.  No acute orbital findings. Other: No mastoid effusions. Review of the MIP images confirms the above findings CTA NECK FINDINGS Aortic arch: Great vessel origins are patent without significant stenosis. Calcific atherosclerosis. Right carotid system: Atherosclerosis at the carotid bifurcation with approximately 40% stenosis. Left carotid system: Atherosclerosis of the carotid bifurcation without greater than 50% stenosis Vertebral arteries: Focal occlusion versus severe/critical stenosis of the left  vertebral artery origin with more distal opacification. Moderate right vertebral artery origin stenosis. Remainder of the vertebral arteries are patent bilaterally. Skeleton: No acute abnormality limited assessment. Other neck: No acute abnormality on limited assessment. Upper chest: Visualized lung apices are clear. Review of the MIP images confirms the above findings CTA HEAD FINDINGS  Anterior circulation: Bilateral intracranial ICAs, MCAs and ACAs are patent without proximal hemodynamically significant stenosis. Posterior circulation: Bilateral intradural vertebral arteries, basilar artery and bilateral posterior cerebral arteries are patent without proximally normally in stenosis. Venous sinuses: As permitted by contrast timing, patent. Review of the MIP images confirms the above findings IMPRESSION: 1. Focal occlusion versus severe/critical stenosis of the left vertebral artery origin with more distal opacification. 2. Moderate right vertebral artery origin stenosis. 3. Approximately 40% stenosis of the right ICA origin in the neck. 4.  Aortic Atherosclerosis (ICD10-I70.0). Electronically Signed   By: Feliberto Harts M.D.   On: 08/12/2022 14:40   MR BRAIN WO CONTRAST  Result Date: 08/12/2022 CLINICAL DATA:  Provided history: Neuro deficit, acute, stroke suspected. Fall. Altered mental status. EXAM: MRI HEAD WITHOUT CONTRAST TECHNIQUE: Multiplanar, multiecho pulse sequences of the brain and surrounding structures were obtained without intravenous contrast. COMPARISON:  Head CT 08/11/2022. FINDINGS: Intermittently motion degraded examination. Most notably, the sagittal T1-weighted sequence is severely motion degraded. Within this limitation, findings are as follows. Brain: Mild-to-moderate generalized cerebral atrophy. 16 x 8 mm acute infarct within the left aspect of the pons (series 2, image 14). 6 mm acute infarct within the right thalamus (series 2, image 24). 7 mm acute infarct within the left thalamus  (series 2, image 23). 3 mm acute cortical infarct within the inferomedial left parietal lobe (series 3, image 13). 6 mm acute infarct within the right parietal white matter (series 2, image 30) (series 3, image 11). 3 mm acute/early subacute infarct within the mid-to-posterior left frontal lobe white matter (series 2, image 33) (series 3, image 21). Chronic lacunar infarcts within the bilateral corona radiata and thalami. Background moderate multifocal T2 FLAIR hyperintense signal abnormality within the cerebral white matter, nonspecific but compatible with chronic small vessel ischemic disease. No evidence of an intracranial mass. No extra-axial fluid collection. No midline shift. Vascular: Maintained flow voids within the proximal large arterial vessels. Skull and upper cervical spine: No focal suspicious marrow lesion. Sinuses/Orbits: No mass or acute finding within the imaged orbits. No significant paranasal sinus disease. IMPRESSION: 1. Motion degraded examination. 2. 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 The largest infarct is located within the left aspect of the pons, measuring 16 x 8 mm. Additionally, there is a 3 mm acute/early subacute infarct within the mid-to-posterior left frontal lobe white matter. Involvement of multiple vascular territories, suspicious for an embolic process. 3. Background parenchymal atrophy, chronic small vessel ischemic disease and chronic lacunar infarcts as described. Electronically Signed   By: Jackey Loge D.O.   On: 08/12/2022 10:20   CT Foot Left Wo Contrast  Result Date: 08/12/2022 CLINICAL DATA:  Foot trauma, occult fracture suspected. EXAM: CT OF THE LEFT FOOT WITHOUT CONTRAST TECHNIQUE: Multidetector CT imaging of the left foot was performed according to the standard protocol. Multiplanar CT image reconstructions were also generated. RADIATION DOSE REDUCTION: This exam was performed according to the  departmental dose-optimization program which includes automated exposure control, adjustment of the mA and/or kV according to patient size and/or use of iterative reconstruction technique. COMPARISON:  Radiographs dated Aug 11, 2022 FINDINGS: Bones/Joint/Cartilage There is minimal cortical irregularity about the lateral aspect of the proximal phalanx of the fifth digit which could be due to osseous remodeling or a nondisplaced fracture. There is generalized osteopenia. Mild arthritic changes of the tibiotalar and subtalar joints with marginal spurring. No appreciable fracture of the calcaneus. Ligaments Suboptimally assessed  by CT. Muscles and Tendons Muscles are normal in bulk. Tendons of the flexor and extensor compartments appear intact. Mild Achilles enthesopathy. Soft tissues Skin and subcutaneous soft tissues are within normal limits. No fluid collection or hematoma. IMPRESSION: 1. Minimal cortical irregularity about the lateral aspect of the proximal phalanx of the fifth digit which could be due to osseous remodeling or a nondisplaced fracture. 2. Generalized osteopenia. 3. Mild arthritic changes of the tibiotalar and subtalar joints. Electronically Signed   By: Larose Hires D.O.   On: 08/12/2022 09:56   DG Ribs Bilateral W/Chest  Result Date: 08/11/2022 CLINICAL DATA:  Fall, left rib pain EXAM: BILATERAL RIBS AND CHEST - 9 VIEW COMPARISON:  Chest radiograph 08/10/2022 FINDINGS: Atherosclerotic calcification of the aortic arch. Thoracic spondylosis noted. Cardiac and mediastinal margins appear normal. No pneumothorax. The lungs appear clear. No blunting of the costophrenic angles. Subtle irregularity of the right right anterior ninth rib on 1 of the oblique projections, cannot exclude subtle fracture. IMPRESSION: 1. Subtle irregularity of the right anterior ninth rib on one of the oblique projections, cannot exclude a subtle fracture. No other potential rib fractures are detected. 2. Thoracic spondylosis.  3.  Aortic Atherosclerosis (ICD10-I70.0). Electronically Signed   By: Gaylyn Rong M.D.   On: 08/11/2022 15:13   DG Foot Complete Left  Result Date: 08/11/2022 CLINICAL DATA:  Fall, lateral foot pain EXAM: LEFT FOOT - COMPLETE 3+ VIEW COMPARISON:  None Available. FINDINGS: There is mild cortical irregularity in the lateral metaphysis of the proximal phalanx small toe which could be an indicator of a relatively nondisplaced fracture. The finding is subtle and medial extension is poorly defined. Correlate with point tenderness in this vicinity. Bony demineralization and mild cortical indistinctness along portions of the hindfoot including the posterior subtalar joint and anterior calcaneal process. Linear calcifications in the distal Achilles tendon. Mild cortical irregularity along the dorsal talar neck, probably from spurring. IMPRESSION: 1. Subtle cortical irregularity in the lateral metaphysis of the proximal phalanx small toe could be an indicator of a relatively nondisplaced fracture. Correlate with point tenderness in this vicinity. 2. Bony demineralization and mild cortical indistinctness along portions of the hindfoot. If the patient is unable to bear weight or if there is a high clinical suspicion of hindfoot injury, dedicated CT of the foot should be considered. 3. Linear calcifications in the distal Achilles tendon compatible with mild calcific tendinopathy. Electronically Signed   By: Gaylyn Rong M.D.   On: 08/11/2022 15:08   CT Head Wo Contrast  Result Date: 08/11/2022 CLINICAL DATA:  Fall, altered mental status EXAM: CT HEAD WITHOUT CONTRAST CT CERVICAL SPINE WITHOUT CONTRAST TECHNIQUE: Multidetector CT imaging of the head and cervical spine was performed following the standard protocol without intravenous contrast. Multiplanar CT image reconstructions of the cervical spine were also generated. RADIATION DOSE REDUCTION: This exam was performed according to the departmental  dose-optimization program which includes automated exposure control, adjustment of the mA and/or kV according to patient size and/or use of iterative reconstruction technique. COMPARISON:  08/10/2022 FINDINGS: CT HEAD FINDINGS Brain: No evidence of acute infarction, hemorrhage, hydrocephalus, extra-axial collection or mass lesion/mass effect. Periventricular and deep white matter hypodensity. Vascular: No hyperdense vessel or unexpected calcification. Skull: Normal. Negative for fracture or focal lesion. Sinuses/Orbits: No acute finding. Other: None. CT CERVICAL SPINE FINDINGS Alignment: Degenerative straightening of the normal cervical lordosis. Skull base and vertebrae: No acute fracture. No primary bone lesion or focal pathologic process. Soft tissues and spinal canal: No  prevertebral fluid or swelling. No visible canal hematoma. Disc levels: Moderate multilevel disc space height loss and osteophytosis. Upper chest: Negative. Other: None. IMPRESSION: 1. No acute intracranial pathology. Small-vessel white matter disease. 2. No fracture or static subluxation of the cervical spine. 3. Moderate multilevel cervical disc degenerative disease. Electronically Signed   By: Jearld Lesch M.D.   On: 08/11/2022 14:59   CT Cervical Spine Wo Contrast  Result Date: 08/11/2022 CLINICAL DATA:  Fall, altered mental status EXAM: CT HEAD WITHOUT CONTRAST CT CERVICAL SPINE WITHOUT CONTRAST TECHNIQUE: Multidetector CT imaging of the head and cervical spine was performed following the standard protocol without intravenous contrast. Multiplanar CT image reconstructions of the cervical spine were also generated. RADIATION DOSE REDUCTION: This exam was performed according to the departmental dose-optimization program which includes automated exposure control, adjustment of the mA and/or kV according to patient size and/or use of iterative reconstruction technique. COMPARISON:  08/10/2022 FINDINGS: CT HEAD FINDINGS Brain: No evidence  of acute infarction, hemorrhage, hydrocephalus, extra-axial collection or mass lesion/mass effect. Periventricular and deep white matter hypodensity. Vascular: No hyperdense vessel or unexpected calcification. Skull: Normal. Negative for fracture or focal lesion. Sinuses/Orbits: No acute finding. Other: None. CT CERVICAL SPINE FINDINGS Alignment: Degenerative straightening of the normal cervical lordosis. Skull base and vertebrae: No acute fracture. No primary bone lesion or focal pathologic process. Soft tissues and spinal canal: No prevertebral fluid or swelling. No visible canal hematoma. Disc levels: Moderate multilevel disc space height loss and osteophytosis. Upper chest: Negative. Other: None. IMPRESSION: 1. No acute intracranial pathology. Small-vessel white matter disease. 2. No fracture or static subluxation of the cervical spine. 3. Moderate multilevel cervical disc degenerative disease. Electronically Signed   By: Jearld Lesch M.D.   On: 08/11/2022 14:59   CT Knee Right Wo Contrast  Result Date: 08/10/2022 CLINICAL DATA:  Knee trauma, occult fracture suspected. Pain in right knee. EXAM: CT OF THE RIGHT KNEE WITHOUT CONTRAST TECHNIQUE: Multidetector CT imaging of the right knee was performed according to the standard protocol. Multiplanar CT image reconstructions were also generated. RADIATION DOSE REDUCTION: This exam was performed according to the departmental dose-optimization program which includes automated exposure control, adjustment of the mA and/or kV according to patient size and/or use of iterative reconstruction technique. COMPARISON:  Knee radiographs 08/10/2022 FINDINGS: Bones/Joint/Cartilage No acute fracture or dislocation. 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 Small knee joint effusion. Chondrocalcinosis in the medial and lateral compartments. The deformity seen on same day radiographs about the  medial femoral condyle was projectional or due to remote trauma. Ligaments Suboptimally assessed by CT. Muscles and Tendons No acute abnormality. Soft tissues Mild subcutaneous edema about the anterior knee. IMPRESSION: 1. No acute fracture or dislocation. 2. 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 3. Small knee joint effusion. 4. Chondrocalcinosis in the medial and lateral compartments. Electronically Signed   By: Minerva Fester M.D.   On: 08/10/2022 17:21   DG Pelvis 1-2 Views  Result Date: 08/10/2022 CLINICAL DATA:  Status post fall. EXAM: PELVIS - 1-2 VIEW COMPARISON:  None Available. FINDINGS: There is no evidence of pelvic fracture or diastasis. No pelvic bone lesions are seen. Mild to moderate severity degenerative changes seen involving both hips, in the form of joint space narrowing and acetabular sclerosis. IMPRESSION: Degenerative changes involving both hips. Electronically Signed   By: Aram Candela M.D.   On: 08/10/2022 16:06   DG Knee Complete  4 Views Right  Result Date: 08/10/2022 CLINICAL DATA:  Multiple falls. EXAM: RIGHT KNEE - COMPLETE 4+ VIEW COMPARISON:  June 09, 2005 FINDINGS: A small cortical deformity of indeterminate age is seen involving the medial epicondyle of the distal right femur. There is no evidence of dislocation. Medial and lateral chondrocalcinosis is seen with marked severity medial tibiofemoral joint space narrowing. A small joint effusion is noted. IMPRESSION: Fracture deformity of indeterminate age involving the medial epicondyle of the distal right femur. CT correlation is recommended. Electronically Signed   By: Aram Candela M.D.   On: 08/10/2022 16:05   DG Chest 2 View  Result Date: 08/10/2022 CLINICAL DATA:  Status post multiple falls. EXAM: CHEST - 2 VIEW COMPARISON:  September 08, 2018 FINDINGS: The heart size and mediastinal contours are within normal limits. There is moderate  severity calcification of the aortic arch. Mild atelectasis is seen within the bilateral lung bases. There is no evidence of an acute infiltrate, pleural effusion or pneumothorax. The visualized skeletal structures are unremarkable. IMPRESSION: No acute cardiopulmonary disease. Electronically Signed   By: Aram Candela M.D.   On: 08/10/2022 16:03   CT Head Wo Contrast  Result Date: 08/10/2022 CLINICAL DATA:  Head trauma, minor (Age >= 65y); Neck trauma (Age >= 65y) EXAM: CT HEAD WITHOUT CONTRAST CT CERVICAL SPINE WITHOUT CONTRAST TECHNIQUE: Multidetector CT imaging of the head and cervical spine was performed following the standard protocol without intravenous contrast. Multiplanar CT image reconstructions of the cervical spine were also generated. RADIATION DOSE REDUCTION: This exam was performed according to the departmental dose-optimization program which includes automated exposure control, adjustment of the mA and/or kV according to patient size and/or use of iterative reconstruction technique. COMPARISON:  None Available. FINDINGS: CT HEAD FINDINGS Brain: No evidence of acute cortical infarction, hemorrhage, hydrocephalus, extra-axial collection or mass lesion/mass effect. 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 Vascular: No hyperdense vessel or unexpected calcification. Skull: Normal. Negative for fracture or focal lesion. Sinuses/Orbits: No middle ear or mastoid effusion. Paranasal sinuses are clear. Orbits are unremarkable. Other: None. CT CERVICAL SPINE FINDINGS Alignment: There is straightening of the normal cervical lordosis. Grade 1 anterolisthesis of C3 on C4. Skull base and vertebrae: No acute fracture. No primary bone lesion or focal pathologic process. Soft tissues and spinal canal: No prevertebral fluid or swelling. No visible canal hematoma. Disc levels:  There is moderate spinal canal stenosis at  C5-C6. Upper chest: Negative. Other: Atherosclerotic vascular calcifications. Bilateral tonsilliths. IMPRESSION: 1. No acute intracranial abnormality. 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 2. No acute fracture or traumatic subluxation of the cervical spine. Electronically Signed   By: Lorenza Cambridge M.D.   On: 08/10/2022 15:58   CT Cervical Spine Wo Contrast  Result Date: 08/10/2022 CLINICAL DATA:  Head trauma, minor (Age >= 65y); Neck trauma (Age >= 65y) EXAM: CT HEAD WITHOUT CONTRAST CT CERVICAL SPINE WITHOUT CONTRAST TECHNIQUE: Multidetector CT imaging of the head and cervical spine was performed following the standard protocol without intravenous contrast. Multiplanar CT image reconstructions of the cervical spine were also generated. RADIATION DOSE REDUCTION: This exam was performed according to the departmental dose-optimization program which includes automated exposure control, adjustment of the mA and/or kV according to patient size and/or use of iterative reconstruction technique. COMPARISON:  None Available. FINDINGS: CT HEAD FINDINGS Brain: No evidence of acute cortical  infarction, hemorrhage, hydrocephalus, extra-axial collection or mass lesion/mass effect. 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 Vascular: No hyperdense vessel or unexpected calcification. Skull: Normal. Negative for fracture or focal lesion. Sinuses/Orbits: No middle ear or mastoid effusion. Paranasal sinuses are clear. Orbits are unremarkable. Other: None. CT CERVICAL SPINE FINDINGS Alignment: There is straightening of the normal cervical lordosis. Grade 1 anterolisthesis of C3 on C4. Skull base and vertebrae: No acute fracture. No primary bone lesion or focal pathologic process. Soft tissues and spinal canal: No prevertebral fluid or swelling. No visible  canal hematoma. Disc levels:  There is moderate spinal canal stenosis at C5-C6. Upper chest: Negative. Other: Atherosclerotic vascular calcifications. Bilateral tonsilliths. IMPRESSION: 1. No acute intracranial abnormality. 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 2. No acute fracture or traumatic subluxation of the cervical spine. Electronically Signed   By: Lorenza Cambridge M.D.   On: 08/10/2022 15:58    DISCHARGE EXAMINATION: Vitals:   08/12/22 1939 08/12/22 2318 08/13/22 0322 08/13/22 0723  BP: 111/66 126/74 124/70 115/75  Pulse: 75 76 66 69  Resp: 16 16 16 16   Temp: 98.1 F (36.7 C) 97.7 F (36.5 C) 97.7 F (36.5 C) 97.8 F (36.6 C)  TempSrc: Oral Oral Oral Oral  SpO2: 92% 93% 96% 94%  Weight:      Height:       General appearance: Awake alert.  In no distress Resp: Clear to auscultation bilaterally.  Normal effort Cardio: S1-S2 is normal regular.  No S3-S4.  No rubs murmurs or bruit GI: Abdomen is soft.  Nontender nondistended.  Bowel sounds are present normal.  No masses organomegaly    DISPOSITION: Inpatient rehabilitation  Medications: Scheduled:  aspirin EC  81 mg Oral Daily   escitalopram  10 mg Oral Daily   ezetimibe  10 mg Oral Daily   insulin aspart  0-15 Units Subcutaneous TID WC & HS   levothyroxine  175 mcg Oral Q0600   nicotine  21 mg Transdermal Daily   Apixaban  5 mg Oral bid   rosuvastatin  20 mg Oral Daily    Continuous: [id number] **OR** acetaminophen (TYLENOL) oral liquid 160 mg/5 mL **OR** acetaminophen, senna-docusate      Follow-up Information     Care, [facility] Follow up.   Why: Home Health Services Contact information: [address] [place] [address] [phone]         Plotnikov, Georgina Quint, MD. Schedule an appointment as soon as possible for a visit in 1 week(s).   Specialty: Internal Medicine Contact  information: [address] [place] [address] [phone]                 TOTAL DISCHARGE TIME: 35 minutes  Caliana Spires Rito Ehrlich  [facility] Pager on [email or web address]  08/14/2022, 11:49 AM

## 2022-08-13 NOTE — Progress Notes (Signed)
Inpatient Rehabilitation Yes Medication Review by a Pharmacist  A complete drug regimen review was completed for this patient to identify any potential clinically significant medication issues.  High Risk Drug Classes Is patient taking? Indication by Medication  Antipsychotic No   Anticoagulant Yes Apixaban-Atrial fibrillation  Antibiotic No   Opioid No   Antiplatelet Yes Aspirin-stroke  Hypoglycemics/insulin Yes Novolog SSI-DM  Vasoactive Medication Yes Atenolol-HTN  Chemotherapy No   Other Yes Escitalopram-depression Ezetimibe, rosuvastatin-HLD Levothyroxine-hypothyroidism Methocarbamol-spasms Ondansetron-nausea Nicotine-prevention of w/d Trazodone-sleep     Type of Medication Issue Identified Description of Issue Recommendation(s)  Drug Interaction(s) (clinically significant)     Duplicate Therapy     Allergy     No Medication Administration End Date     Incorrect Dose     Additional Drug Therapy Needed     Significant med changes from prior encounter (inform family/care partners about these prior to discharge). Patient on Imdur, ozempic, and metformin PTA On xarelto PTA Reevaluate continuation of these medications at discharge Patient changed from xarelto to apixaban for insurance coverage reasons. Please ensure patient discharged with Apixaban and send RX to [facility] so that patient can make use of 30 day free card.   Other       Clinically significant medication issues were identified that warrant physician communication and completion of prescribed/recommended actions by midnight of the next day:  No  Name of provider notified for urgent issues identified:   Provider Method of Notification:     Pharmacist comments:   Time spent performing this drug regimen review (minutes):  20   Anthony Woods A. Jeanella Craze, PharmD, BCPS, FNKF Clinical Pharmacist [place] Please utilize Amion for appropriate phone number to reach the unit pharmacist [facility])  08/13/2022 1:36  PM

## 2022-08-13 NOTE — Discharge Summary (Signed)
Physician Discharge Summary  Patient ID: Anthony Woods MRN: [id number] DOB/AGE: 1955-03-31 67 y.o.  Admit date: 08/13/2022 Discharge date: 09/03/2022  Discharge Diagnoses:  Principal Problem:   Acute embolic stroke [facility]) Active Problems:   Mild neurocognitive disorder due to another medical condition Active problems; Functional deficits secondary to acute stroke Hypertension Hyperlipidemia CAD pAF DM-2 Tobacco use Hypothyroidism Obesity Left foot trauma, possible fracture 5th toe   Discharged Condition: stable  Significant Diagnostic Studies: none  Labs:  Basic Metabolic Panel: Recent Labs  Lab 08/30/22 0629  NA 139  K 3.9  CL 104  CO2 26  GLUCOSE 124*  BUN 11  CREATININE 0.84  CALCIUM 9.3    CBC: Recent Labs  Lab 08/30/22 0629  WBC 6.8  HGB 12.6*  HCT 37.1*  MCV 94.2  PLT 196    CBG: Recent Labs  Lab 09/02/22 0607 09/02/22 1118 09/02/22 1712 09/02/22 2049 09/03/22 0629  GLUCAP 102* 175* 177* 181* 125*    Brief HPI:   Anthony Woods is a 67 y.o. male who presented initially to the ED on 08/10/2022 complaining of multiple falls. Found to be hyperglycemic. CT scan of right knee/distal femur, CT head and c-spine, pelvic x-rays and chest x-ray performed. Patient discharged home as he did not want to stay for further work-up. Re-presented on 5/22 with increased confusion and hyperglycemia. Patient and wife states he is not compliant with his medications and not been taking any since January of this year. Repeat imaging with CT c-spine, head and left foot and rib x-rays performed. A1c 15.5%. Xarelto resumed and aspirin 81 mg added.  Informed by pharmacy that apixaban will be better covered by patient's insurance. Changed to apixaban.  He was independent with all mobility and ADLs, but has had increased confusion, falls, and difficulty in last few weeks progressing to the point where his wife is unable to get him up. On 5/23 per OT, max assist for simulated  selfcare sit to stand EOB. Increased lean and LOB to the right and posteriorly with dressing tasks. Mod assist for sit to stand with max assist for taking steps forward and backwards with the RW. Increased ataxia noted in standing and with using the RUE. On dysphagia 3 diet.  MRI showed multiple acute infarcts, a subacute infarct, chronic small vessel ischemic disease and chronic lacunar infarcts.  Patient and wife states he is not compliant with his medications and not been taking any since January of this year.    Hospital Course: Anthony Woods was admitted to rehab 08/13/2022 for inpatient therapies to consist of PT, ST and OT at least three hours five days a week. Past admission physiatrist, therapy team and rehab RN have worked together to provide customized collaborative inpatient rehab. Follow-up labs with K+ of 3.3; normal CBC. Started KCl supplementation. Lexapro started on acute side and was continued. He began getting trazodone 50 mg prn for poor sleep on 5/25. Using nightly. Right knee pain an buckling noted and sleeve obtained. RN reported confusion and trazodone discontinued and melatonin added as needed for sleep. UA checked on 5/29>>WNL. New labs obtained 5/30 and Lexapro discontinued. Labs stable (glucose 256).  Demonstrated significant restlessness overnight on 5/31. Started on Seroquel 25 mg 6/01. Sleep chart enforced and increased Seroquel to 50 mg. Scheduled trazodone nightly on 6/04. Neuropsychology consulted on 6/05.   Blood pressures were monitored on TID basis and remained controlled on atenolol 50 mg daily. Decreased atenolol to 12.5 mg daily on 5/28. Discontinued  atenolol on 5/30 due to dizziness. Added Toprol-XL 12.5 mg q HS on 6/04.   Diabetes has been monitored with ac/hs CBG checks and SSI was use prn for tighter BS control. Restarted metformin 1000 mg BID on 5/27 due to elevated CBGs. Started on home 70/30 6 units BID on 6/01 and to  10 units BID on 6/02. Wife does not feel  comfortable administering insulin. Ordered Januvia instead.   Rehab course: During patient's stay in rehab weekly team conferences were held to monitor patient's progress, set goals and discuss barriers to discharge. At admission, patient required  mod with basic self-care skills and with mobility. He has met 9 of 9 long term goals due to improvement and met 9 of 9 long term goals in activity tolerance, balance, postural control as well as ability to compensate for deficits. He has had improvement in functional use RUE/LUE  and RLE/LLE as well as improvement in awareness.   Mild cognitive impairment: discussed Namenda with his sister and she will discuss with her siblings as well, continue SLP, discussed that he could have early stages of vascular dementia     Disposition: Discharge disposition: 01-Home or Self Care      Diet: heart healthy, carb modified  Special Instructions: No driving, alcohol consumption or tobacco use.  Pharmacy used coupon card for a zero copay for Eliquis. He is only on Medicare A&B and not on D that covers prescriptions. Per pt advocate, pt doesn't qualify for patient assistance because he has not spent 3% of his annual income on prescriptions.  Recommend checking fingerstick blood sugars four (4) times daily and record. Bring this information with you to follow-up appointment with PCP.  Recommend daily BP measurement in same arm and record time of day. Bring this information with you to follow-up appointment with PCP.  30-35 minutes were spent on discharge planning and discharge summary.  Discharge Instructions     Ambulatory referral to Neurology   Complete by: As directed    An appointment is requested in approximately: 4 weeks   Ambulatory referral to Physical Medicine Rehab   Complete by: As directed    Hospital follow-up   Discharge patient   Complete by: As directed    Discharge disposition: 01-Home or Self Care   Discharge patient date: 09/03/2022       Allergies as of 09/03/2022       Reactions   Bee Venom Anaphylaxis   Other reaction(s): Unknown   Actos [pioglitazone Hydrochloride] Swelling   Lipitor [atorvastatin] Other (See Comments)   Patient cannot recall reaction   Lisinopril Other (See Comments)   cough        Medication List     STOP taking these medications    atenolol-chlorthalidone 100-25 MG tablet Commonly known as: TENORETIC   escitalopram 10 MG tablet Commonly known as: LEXAPRO   insulin lispro protamine-lispro (75-25) 100 UNIT/ML Susp injection Commonly known as: HUMALOG 75/25 MIX   Insulin Pen Needle 32G X 4 MM Misc Commonly known as: UltiCare Micro Pen Needles   isosorbide mononitrate 30 MG 24 hr tablet Commonly known as: IMDUR   potassium chloride SA 20 MEQ tablet Commonly known as: KLOR-CON M   Xarelto 20 MG Tabs tablet Generic drug: rivaroxaban       TAKE these medications    acarbose 50 MG tablet Commonly known as: PRECOSE Take 1 tablet (50 mg total) by mouth 3 (three) times daily with meals.   acetaminophen 325 MG tablet Commonly known  as: TYLENOL Take 1-2 tablets (325-650 mg total) by mouth every 6 (six) hours as needed for moderate pain or mild pain. What changed:  medication strength how much to take reasons to take this   apixaban 5 MG Tabs tablet Commonly known as: ELIQUIS Take 1 tablet (5 mg total) by mouth 2 (two) times daily.   aspirin EC 81 MG tablet Take 1 tablet (81 mg total) by mouth daily for 30 days then as directed by MD .Swallow whole.   B-12 500 MCG Tabs TAKE 1 TABLET (500 MCG TOTAL) BY MOUTH DAILY.   diphenhydrAMINE 25 mg capsule Commonly known as: BENADRYL Take 25 mg by mouth as needed (bee stings).   EPINEPHrine 0.3 mg/0.3 mL Soaj injection Commonly known as: EpiPen 2-Pak Inject 0.3 mg into the muscle as needed for anaphylaxis.   ezetimibe 10 MG tablet Commonly known as: ZETIA Take 1 tablet (10 mg total) by mouth daily.   FreeStyle Libre  3 Reader [place] 1 each by Does not apply route daily.   FreeStyle Libre 3 Sensor Misc 1 each by Does not apply route daily. Place 1 sensor on the skin every 14 days. Use to check glucose continuously   levothyroxine 175 MCG tablet Commonly known as: SYNTHROID Take 1 tablet (175 mcg total) by mouth daily at 6 (six) AM. What changed:  medication strength how much to take when to take this   linagliptin 5 MG Tabs tablet Commonly known as: TRADJENTA Take 1 tablet (5 mg total) by mouth daily.   magnesium oxide 400 (240 Mg) MG tablet Commonly known as: MAG-OX Take 1 tablet (400 mg total) by mouth daily at 2 PM for 30 days then as directed by MD   melatonin 3 MG Tabs tablet Take 1 tablet (3 mg total) by mouth at bedtime.   metFORMIN 1000 MG tablet Commonly known as: GLUCOPHAGE Take 1 tablet (1,000 mg total) by mouth 2 (two) times daily with a meal.   metoprolol succinate 25 MG 24 hr tablet Commonly known as: TOPROL-XL Take 0.5 tablets (12.5 mg total) by mouth daily.   nicotine 21 mg/24hr patch Commonly known as: NICODERM CQ - dosed in mg/24 hours Place 1 patch (21 mg total) onto the skin daily.   nitroGLYCERIN 0.4 MG SL tablet Commonly known as: NITROSTAT Place 1 tablet (0.4 mg total) under the tongue every 5 (five) minutes as needed for chest pain.   Ozempic (0.25 or 0.5 MG/DOSE) 2 MG/1.5ML Sopn Generic drug: Semaglutide(0.25 or 0.5MG /DOS) Inject 0.5 mg into the skin once a week.   QUEtiapine 50 MG tablet Commonly known as: SEROQUEL Take 1 tablet (50 mg total) by mouth at bedtime.   rosuvastatin 20 MG tablet Commonly known as: CRESTOR Take 1 tablet (20 mg total) by mouth daily. What changed: See the new instructions.   traZODone 150 MG tablet Commonly known as: DESYREL Take 0.5 tablets (75 mg total) by mouth at bedtime.        Follow-up Information     Plotnikov, Georgina Quint, MD Follow up.   Specialty: Internal Medicine Why: Call the office in 1-2 days to make  arrangements for hospital follow-up appointment. Contact information: [address] [place] [address] [phone]         Horton Chin, MD Follow up.   Specialty: Physical Medicine and Rehabilitation Why: office will call you to arrange your appt (sent) Contact information: [address] [place] [address] [phone]  [facility] Follow up.   Why: Call the office in 1-2 days to make arrangements for hospital follow-up appointment. Contact information: [address]     [address] [place] [address] [phone]                Signed: Milinda Antis 09/03/2022, 10:59 AM

## 2022-08-13 NOTE — Progress Notes (Signed)
Echocardiogram 2D Echocardiogram has been performed.  Lucendia Herrlich 08/13/2022, 11:20 AM

## 2022-08-13 NOTE — Progress Notes (Signed)
Modified Barium Swallow Study  Patient Details  Name: Anthony Woods MRN: [id number] Date of Birth: [birth year]  Today's Date: 08/13/2022  Modified Barium Swallow completed.  Full report located under Chart Review in the Imaging Section.  History of Present Illness The pt is a 67 yo male presenting 5/22 with increased confusion and frequent falls.     Acute infarcts within the left aspect of the pons, bilateral  thalami and bilateral parietal lobes. The largest infarct is located  within the left aspect of the pons, measuring 16 x 8 mm.  Additionally, there is a 3 mm acute/early subacute infarct within  the mid-to-posterior left frontal lobe white matter. Involvement of  multiple vascular territories, suspicious for an embolic process.   Clinical Impression Pt demonstrates mild oropharyngeal dysphagia, noted to have a slightly late swallow initaition at times, likely accounting for instances of cough seen in clinical swallow eval. However, today despite being pushed to drink consecutive large quantites via straw pt had no aspiration and no oral or pharyngeal weakness with residue. Pt has one tooth and no dentures and at baseline can masticate with or without dentures. He demonstrated this ability today. Also despite moderate dysarthria, there was no focal lingual weakness and no significant oral dysphagia. Pt may resume a mechanical soft diet and thin liquids with aspiration precautions. SLP will check in on pts tolerance of meals at least once. Factors that may increase risk of adverse event in presence of aspiration Anthony Woods 2021):    Swallow Evaluation Recommendations Recommendations: PO diet PO Diet Recommendation: Dysphagia 3 (Mechanical soft);Thin liquids (Level 0) Liquid Administration via: Cup;Straw Medication Administration: Whole meds with liquid Supervision: Staff to assist with self-feeding Postural changes: Position pt fully upright for meals;Stay upright 30-60 min after  meals      Anthony Woods, Riley Nearing 08/13/2022,10:37 AM

## 2022-08-14 DIAGNOSIS — I639 Cerebral infarction, unspecified: Secondary | ICD-10-CM | POA: Diagnosis not present

## 2022-08-14 LAB — COMPREHENSIVE METABOLIC PANEL
ALT: 17 U/L (ref 0–44)
AST: 23 U/L (ref 15–41)
Albumin: 3.5 g/dL (ref 3.5–5.0)
Alkaline Phosphatase: 77 U/L (ref 38–126)
Anion gap: 12 (ref 5–15)
BUN: 21 mg/dL (ref 8–23)
CO2: 27 mmol/L (ref 22–32)
Calcium: 10.1 mg/dL (ref 8.9–10.3)
Chloride: 99 mmol/L (ref 98–111)
Creatinine, Ser: 0.84 mg/dL (ref 0.61–1.24)
GFR, Estimated: >60 mL/min (ref >60)
Glucose, Bld: 225 mg/dL — ABNORMAL HIGH (ref 70–99)
Potassium: 3.3 mmol/L — ABNORMAL LOW (ref 3.5–5.1)
Sodium: 138 mmol/L (ref 135–145)
Total Bilirubin: 0.8 mg/dL (ref 0.3–1.2)
Total Protein: 6.1 g/dL — ABNORMAL LOW (ref 6.5–8.1)

## 2022-08-14 LAB — CBC WITH DIFFERENTIAL/PLATELET
Abs Immature Granulocytes: 0.02 10*3/uL (ref 0.00–0.07)
Basophils Absolute: 0.1 10*3/uL (ref 0.0–0.1)
Basophils Relative: 1 %
Eosinophils Absolute: 0.2 10*3/uL (ref 0.0–0.5)
Eosinophils Relative: 2 %
HCT: 46.1 % (ref 39.0–52.0)
Hemoglobin: 16 g/dL (ref 13.0–17.0)
Immature Granulocytes: 0 %
Lymphocytes Relative: 30 %
Lymphs Abs: 2.3 10*3/uL (ref 0.7–4.0)
MCH: 31.8 pg (ref 26.0–34.0)
MCHC: 34.7 g/dL (ref 30.0–36.0)
MCV: 91.7 fL (ref 80.0–100.0)
Monocytes Absolute: 0.6 10*3/uL (ref 0.1–1.0)
Monocytes Relative: 7 %
Neutro Abs: 4.6 10*3/uL (ref 1.7–7.7)
Neutrophils Relative %: 60 %
Platelets: 214 10*3/uL (ref 150–400)
RBC: 5.03 MIL/uL (ref 4.22–5.81)
RDW: 12.4 % (ref 11.5–15.5)
WBC: 7.8 10*3/uL (ref 4.0–10.5)
nRBC: 0 % (ref 0.0–0.2)

## 2022-08-14 LAB — GLUCOSE, CAPILLARY
Glucose-Capillary: 246 mg/dL — ABNORMAL HIGH (ref 70–99)
Glucose-Capillary: 252 mg/dL — ABNORMAL HIGH (ref 70–99)
Glucose-Capillary: 161 mg/dL — ABNORMAL HIGH (ref 70–99)
Glucose-Capillary: 188 mg/dL — ABNORMAL HIGH (ref 70–99)

## 2022-08-14 MED ORDER — POTASSIUM CHLORIDE CRYS ER 20 MEQ PO TBCR
40.0000 meq | EXTENDED_RELEASE_TABLET | Freq: Once | ORAL | Status: AC
  Administered 2022-08-14: 40 meq via ORAL
  Filled 2022-08-14: qty 2

## 2022-08-14 MED ORDER — METFORMIN HCL 500 MG PO TABS
1000.0000 mg | ORAL_TABLET | Freq: Two times a day (BID) | ORAL | Status: DC
  Administered 2022-08-14 – 2022-09-03 (×40): 1000 mg via ORAL
  Filled 2022-08-14 (×41): qty 2

## 2022-08-14 NOTE — Plan of Care (Signed)
  Problem: RH Balance Goal: LTG Patient will maintain dynamic sitting balance (PT) Description: LTG:  Patient will maintain dynamic sitting balance with assistance during mobility activities (PT) Flowsheets (Taken 08/14/2022 2028) LTG: Pt will maintain dynamic sitting balance during mobility activities with:: Independent with assistive device  Goal: LTG Patient will maintain dynamic standing balance (PT) Description: LTG:  Patient will maintain dynamic standing balance with assistance during mobility activities (PT) Flowsheets (Taken 08/14/2022 2028) LTG: Pt will maintain dynamic standing balance during mobility activities with:: Supervision/Verbal cueing   Problem: Sit to Stand Goal: LTG:  Patient will perform sit to stand with assistance level (PT) Description: LTG:  Patient will perform sit to stand with assistance level (PT) Flowsheets (Taken 08/14/2022 2028) LTG: PT will perform sit to stand in preparation for functional mobility with assistance level: Supervision/Verbal cueing   Problem: RH Bed Mobility Goal: LTG Patient will perform bed mobility with assist (PT) Description: LTG: Patient will perform bed mobility with assistance, with/without cues (PT). Flowsheets (Taken 08/14/2022 2028) LTG: Pt will perform bed mobility with assistance level of: Independent with assistive device    Problem: RH Bed to Chair Transfers Goal: LTG Patient will perform bed/chair transfers w/assist (PT) Description: LTG: Patient will perform bed to chair transfers with assistance (PT). Flowsheets (Taken 08/14/2022 2028) LTG: Pt will perform Bed to Chair Transfers with assistance level: Contact Guard/Touching assist   Problem: RH Car Transfers Goal: LTG Patient will perform car transfers with assist (PT) Description: LTG: Patient will perform car transfers with assistance (PT). Flowsheets (Taken 08/14/2022 2028) LTG: Pt will perform car transfers with assist:: Supervision/Verbal cueing   Problem: RH  Furniture Transfers Goal: LTG Patient will perform furniture transfers w/assist (OT/PT) Description: LTG: Patient will perform furniture transfers  with assistance (OT/PT). Flowsheets (Taken 08/14/2022 2028) LTG: Pt will perform furniture transfers with assist:: Contact Guard/Touching assist   Problem: RH Ambulation Goal: LTG Patient will ambulate in controlled environment (PT) Description: LTG: Patient will ambulate in a controlled environment, # of feet with assistance (PT). Flowsheets (Taken 08/14/2022 2028) LTG: Pt will ambulate in controlled environ  assist needed:: Contact Guard/Touching assist LTG: Ambulation distance in controlled environment: at least 150 ft using LRAD Goal: LTG Patient will ambulate in home environment (PT) Description: LTG: Patient will ambulate in home environment, # of feet with assistance (PT). Flowsheets (Taken 08/14/2022 2028) LTG: Pt will ambulate in home environ  assist needed:: Contact Guard/Touching assist LTG: Ambulation distance in home environment: at least 50 ft using LRAD   Problem: RH Wheelchair Mobility Goal: LTG Patient will propel w/c in controlled environment (PT) Description: LTG: Patient will propel wheelchair in controlled environment, # of feet with assist (PT) Flowsheets (Taken 08/14/2022 2028) LTG: Pt will propel w/c in controlled environ  assist needed:: Supervision/Verbal cueing LTG: Propel w/c distance in controlled environment: >100 ft   Problem: RH Stairs Goal: LTG Patient will ambulate up and down stairs w/assist (PT) Description: LTG: Patient will ambulate up and down # of stairs with assistance (PT) Flowsheets (Taken 08/14/2022 2028) LTG: Pt will ambulate up/down stairs assist needed:: Contact Guard/Touching assist LTG: Pt will  ambulate up and down number of stairs: at least 4 steps using HR setup as per home environment in order to enter/ exit home

## 2022-08-14 NOTE — Plan of Care (Signed)
  Problem: RH Balance Goal: LTG: Patient will maintain dynamic sitting balance (OT) Description: LTG:  Patient will maintain dynamic sitting balance with assistance during activities of daily living (OT) Flowsheets (Taken 08/14/2022 1303) LTG: Pt will maintain dynamic sitting balance during ADLs with: Supervision/Verbal cueing Goal: LTG Patient will maintain dynamic standing with ADLs (OT) Description: LTG:  Patient will maintain dynamic standing balance with assist during activities of daily living (OT)  Flowsheets (Taken 08/14/2022 1303) LTG: Pt will maintain dynamic standing balance during ADLs with: Supervision/Verbal cueing   Problem: Sit to Stand Goal: LTG:  Patient will perform sit to stand in prep for activites of daily living with assistance level (OT) Description: LTG:  Patient will perform sit to stand in prep for activites of daily living with assistance level (OT) Flowsheets (Taken 08/14/2022 1303) LTG: PT will perform sit to stand in prep for activites of daily living with assistance level: Supervision/Verbal cueing   Problem: RH Eating Goal: LTG Patient will perform eating w/assist, cues/equip (OT) Description: LTG: Patient will perform eating with assist, with/without cues using equipment (OT) Flowsheets (Taken 08/14/2022 1303) LTG: Pt will perform eating with assistance level of: Independent   Problem: RH Grooming Goal: LTG Patient will perform grooming w/assist,cues/equip (OT) Description: LTG: Patient will perform grooming with assist, with/without cues using equipment (OT) Flowsheets (Taken 08/14/2022 1303) LTG: Pt will perform grooming with assistance level of: Independent   Problem: RH Bathing Goal: LTG Patient will bathe all body parts with assist levels (OT) Description: LTG: Patient will bathe all body parts with assist levels (OT) Flowsheets (Taken 08/14/2022 1303) LTG: Pt will perform bathing with assistance level/cueing: Supervision/Verbal cueing   Problem: RH  Dressing Goal: LTG Patient will perform upper body dressing (OT) Description: LTG Patient will perform upper body dressing with assist, with/without cues (OT). Flowsheets (Taken 08/14/2022 1303) LTG: Pt will perform upper body dressing with assistance level of: Set up assist Goal: LTG Patient will perform lower body dressing w/assist (OT) Description: LTG: Patient will perform lower body dressing with assist, with/without cues in positioning using equipment (OT) Flowsheets (Taken 08/14/2022 1303) LTG: Pt will perform lower body dressing with assistance level of: Supervision/Verbal cueing   Problem: RH Toileting Goal: LTG Patient will perform toileting task (3/3 steps) with assistance level (OT) Description: LTG: Patient will perform toileting task (3/3 steps) with assistance level (OT)  Flowsheets (Taken 08/14/2022 1303) LTG: Pt will perform toileting task (3/3 steps) with assistance level: Supervision/Verbal cueing   Problem: RH Functional Use of Upper Extremity Goal: LTG Patient will use RT/LT upper extremity as a (OT) Description: LTG: Patient will use right/left upper extremity as a stabilizer/gross assist/diminished/nondominant/dominant level with assist, with/without cues during functional activity (OT) Flowsheets (Taken 08/14/2022 1303) LTG: Use of upper extremity in functional activities: RUE as dominant level LTG: Pt will use upper extremity in functional activity with assistance level of: Independent   Problem: RH Toilet Transfers Goal: LTG Patient will perform toilet transfers w/assist (OT) Description: LTG: Patient will perform toilet transfers with assist, with/without cues using equipment (OT) Flowsheets (Taken 08/14/2022 1303) LTG: Pt will perform toilet transfers with assistance level of: Supervision/Verbal cueing   Problem: RH Tub/Shower Transfers Goal: LTG Patient will perform tub/shower transfers w/assist (OT) Description: LTG: Patient will perform tub/shower transfers  with assist, with/without cues using equipment (OT) Flowsheets (Taken 08/14/2022 1303) LTG: Pt will perform tub/shower stall transfers with assistance level of: Supervision/Verbal cueing LTG: Pt will perform tub/shower transfers from: Walk in shower

## 2022-08-14 NOTE — Progress Notes (Signed)
Physical Therapy Session Note  Patient Details  Name: Anthony Woods MRN: [id number] Date of Birth: 1955/06/03  Today's Date: 08/14/2022 PT Individual Time: 1500-1525 PT Individual Time Calculation (min): 25 min   Short Term Goals: Week 1:  PT Short Term Goal 1 (Week 1): Pt will perform all bed mobility from flat bed surface with consistent SUP. PT Short Term Goal 2 (Week 1): Pt will perform squat pivot transfer with overall MinA and mod cues for technique. PT Short Term Goal 3 (Week 1): pt will perform stand pivot transfers using LRAD with MinA. PT Short Term Goal 4 (Week 1): Pt will initiate stair training. PT Short Term Goal 5 (Week 1): Pt will ambulate at least 50 ft using LRAD and overall MinA.  Skilled Therapeutic Interventions/Progress Updates:  Chart reviewed and pt agreeable to therapy. Pt received semi-reclined in bed with 5/10 c/o pain in R knee. Session focused on functional transfers and amb practice to promote safe home access. Pt initiated session with transfer to EOB using S. Pt then completed blocked practice of sit to stand with MinA + strong VC for safe sequencing + RW. Pt able to progress to CGA + RW with correct sequencing. Pt then completed SPT to WC using MinA + RW + strong VC 2/2 confusion with turn. Pt then completed amb of 62ft using ModA + RW + WC follow 2/2 L knee instability. Pt then returned to room and continued SPT practice with noted need of strong VC for L SPT 2/2 confusion with turn + MinA + RW for balance. Pt completed bed mobility with S. Session education emphasized RW safety. At end of session, pt was left sem-reclined in bed with alarm engaged, nurse call bell and all needs in reach.     Therapy Documentation Precautions:  Precautions Precautions: Fall Precaution Comments: R sided reduced coordination, R sided weakness and LLE weakness/reduced motor planning during ambulation Restrictions Weight Bearing Restrictions: No    Therapy/Group:  Individual Therapy  Dionne Milo, PT, DPT 08/14/2022, 3:39 PM

## 2022-08-14 NOTE — Evaluation (Signed)
Occupational Therapy Assessment and Plan  Patient Details  Name: Anthony Woods MRN: [id number] Date of Birth: 06/03/1955  OT Diagnosis: apraxia, ataxia, cognitive deficits, disturbance of vision, hemiplegia affecting dominant side, and pain in joint Rehab Potential: Rehab Potential (ACUTE ONLY): Good ELOS: 14-16 days   Today's Date: 08/14/2022 OT Individual Time: 1100-1200 and [id number] OT Individual Time Calculation (min): 60 min   and 40 min  Hospital Problem: Principal Problem:   Acute embolic stroke [facility])   Past Medical History:  Past Medical History:  Diagnosis Date   Allergy to bee sting    Anxiety    Arthritis    "right knee" (05/12/2016)   CAD (coronary artery disease)    a. DES to LCx 04/2016. b. DES to prox Cx 08/2017. c. Stable cath 09/2018 with mild nonobstructive residual disease, normal EF.   Headache    "when his sugar goes too high or too low" (05/12/2016)   HTN (hypertension)    Hyperlipidemia    Hypothyroidism    Macular degeneration    Obesity    PAF (paroxysmal atrial fibrillation) (HCC)    Type II diabetes mellitus (HCC)    Past Surgical History:  Past Surgical History:  Procedure Laterality Date   CARDIAC CATHETERIZATION     COLONOSCOPY     CORONARY ANGIOPLASTY WITH STENT PLACEMENT  05/12/2016   CORONARY STENT INTERVENTION N/A 05/12/2016   Procedure: Coronary Stent Intervention;  Surgeon: Corky Crafts, MD;  Location: [facility] INVASIVE CV LAB;  Service: Cardiovascular;  Laterality: N/A;   CORONARY STENT INTERVENTION N/A 08/31/2017   Procedure: CORONARY STENT INTERVENTION;  Surgeon: Corky Crafts, MD;  Location: [facility] INVASIVE CV LAB;  Service: Cardiovascular;  Laterality: N/A;   GANGLION CYST EXCISION Left 1980s   KNEE ARTHROSCOPY Right    Meniscus removal   LEFT HEART CATH AND CORONARY ANGIOGRAPHY N/A 05/12/2016   Procedure: Left Heart Cath and Coronary Angiography;  Surgeon: Corky Crafts, MD;  Location: [facility] INVASIVE CV LAB;  Service:  Cardiovascular;  Laterality: N/A;   LEFT HEART CATH AND CORONARY ANGIOGRAPHY N/A 08/31/2017   Procedure: LEFT HEART CATH AND CORONARY ANGIOGRAPHY;  Surgeon: Corky Crafts, MD;  Location: [facility] INVASIVE CV LAB;  Service: Cardiovascular;  Laterality: N/A;   LEFT HEART CATHETERIZATION WITH CORONARY ANGIOGRAM N/A 02/04/2012   Procedure: LEFT HEART CATHETERIZATION WITH CORONARY ANGIOGRAM;  Surgeon: Kathleene Hazel, MD;  Location: [facility] CATH LAB;  Service: Cardiovascular;  Laterality: N/A;   POLYPECTOMY     RIGHT/LEFT HEART CATH AND CORONARY ANGIOGRAPHY N/A 09/28/2018   Procedure: RIGHT/LEFT HEART CATH AND CORONARY ANGIOGRAPHY;  Surgeon: Kathleene Hazel, MD;  Location: [facility] INVASIVE CV LAB;  Service: Cardiovascular;  Laterality: N/A;    Assessment & Plan Clinical Impression: Anthony Woods is a 67 year old male who presented initially to the ED on 08/10/2022 complaining of multiple falls. Found to be hyperglycemic. CT scan of right knee/distal femur, CT head and c-spine, pelvic x-rays and chest x-ray performed. Patient discharged home as he did not want to stay for further work-up. Re-presented on 5/22 with increased confusion and hyperglycemia. Patient and wife states he is not compliant with his medications and not been taking any since January of this year. Repeat imaging with CT c-spine, head and left foot and rib x-rays performed. A1c 15.5%. Xarelto resumed and aspirin 81 mg added.  Informed by pharmacy that apixaban will be better covered by patient's insurance. Changed to apixaban.  He was independent with all mobility and ADLs,  but has had increased confusion, falls, and difficulty in last few weeks progressing to the point where his wife is unable to get him up. On 5/23 per OT, max assist for simulated selfcare sit to stand EOB. Increased lean and LOB to the right and posteriorly with dressing tasks. Mod assist for sit to stand with max assist for taking steps forward and backwards with the  RW. Increased ataxia noted in standing and with using the RUE. On dysphagia 3 diet.  MRI showed multiple acute infarcts, a subacute infarct, chronic small vessel ischemic disease and chronic lacunar infarcts.  Patient and wife states he is not compliant with his medications and not been taking any since January of this year. The patient requires inpatient medicine and rehabilitation evaluations and services for ongoing dysfunction secondary to scattered acute ischemic infarcts in the left aspect of the pons.    Patient transferred to CIR on 08/13/2022 .    Patient currently requires mod with basic self-care skills secondary to unbalanced muscle activation, motor apraxia, and decreased coordination, decreased visual motor skills, decreased attention to right, decreased awareness, decreased problem solving, and decreased memory, central origin, and decreased sitting balance, decreased standing balance, decreased postural control, hemiplegia, and decreased balance strategies.  Prior to hospitalization, patient was fully independent.   Patient will benefit from skilled intervention to increase independence with basic self-care skills prior to discharge home with care partner.  Anticipate patient will require 24 hour supervision and follow up home health.  OT - End of Session Activity Tolerance: Tolerates 10 - 20 min activity with multiple rests Endurance Deficit: Yes Endurance Deficit Description: pt c/o intermittent dizziness. BP 124/74.  after resting a few minutes in sitting dizziness improves OT Assessment Rehab Potential (ACUTE ONLY): Good OT Barriers to Discharge: Decreased caregiver support OT Barriers to Discharge Comments: Pt stated his wife has a lot of physical problems and can not help physically OT Patient demonstrates impairments in the following area(s): Balance;Cognition;Endurance;Motor;Perception;Sensory;Vision OT Basic ADL's Functional Problem(s):  Grooming;Bathing;Dressing;Toileting;Eating OT Transfers Functional Problem(s): Toilet;Tub/Shower OT Additional Impairment(s): Fuctional Use of Upper Extremity OT Plan OT Intensity: Minimum of 1-2 x/day, 45 to 90 minutes OT Frequency: 5 out of 7 days OT Duration/Estimated Length of Stay: 14-16 days OT Treatment/Interventions: Balance/vestibular training;Discharge planning;Cognitive remediation/compensation;DME/adaptive equipment instruction;Functional mobility training;Neuromuscular re-education;Psychosocial support;Patient/family education;Self Care/advanced ADL retraining;UE/LE Strength taining/ROM;Therapeutic Exercise;Therapeutic Activities;UE/LE Coordination activities;Visual/perceptual remediation/compensation OT Self Feeding Anticipated Outcome(s): independent OT Basic Self-Care Anticipated Outcome(s): supervision OT Toileting Anticipated Outcome(s): supervision OT Bathroom Transfers Anticipated Outcome(s): supervision OT Recommendation Recommendations for Other Services: Vestibular eval Patient destination: Home Follow Up Recommendations: Home health OT Equipment Recommended: To be determined   OT Evaluation Precautions/Restrictions  Precautions Precautions: Fall Precaution Comments: R sided reduced coordination, R sided weakness and LLE weakness/reduced motor planning during ambulation Restrictions Weight Bearing Restrictions: No    Pain Pain Assessment Pain Scale: 0-10 Pain Score: 0-No pain Home Living/Prior Functioning Home Living Family/patient expects to be discharged to:: Private residence Living Arrangements: Spouse/significant other Available Help at Discharge: Available 24 hours/day Type of Home: Mobile home Home Access: Stairs to enter [employer] of Steps: 4 Entrance Stairs-Rails: Right, Left Home Layout: One level Bathroom Shower/Tub: Health visitor: Standard Bathroom Accessibility: Yes  Lives With: Spouse Prior  Function Level of Independence: Independent with basic ADLs, Independent with gait, Independent with homemaking with ambulation, Independent with transfers  Able to Take Stairs?: Yes Driving: Yes Vocation: Retired Administrator, sports Baseline Vision/History: 1 Wears glasses (has progressive transition lenses - not in  hospital) Ability to See in Adequate Light: 1 Impaired Patient Visual Report: Blurring of vision Vision Assessment?: Yes Eye Alignment: Within Functional Limits Ocular Range of Motion: Within Functional Limits Alignment/Gaze Preference: Within Defined Limits Tracking/Visual Pursuits: Decreased smoothness of horizontal tracking;Decreased smoothness of vertical tracking (pt stated he felt dizzy during tracking assessment) Saccades: Decreased speed of saccadic movement (slow with eye shifts to the left) Visual Fields: No apparent deficits Perception  Perception: Impaired Inattention/Neglect: Other (comment) Spatial Orientation: slight R lean in sitting/ standing; slow LOB posteriorly in sitting with no move to self correct Praxis Praxis: Impaired Praxis Impairment Details: Motor planning Cognition Cognition Overall Cognitive Status: Impaired/Different from baseline Arousal/Alertness: Awake/alert Orientation Level: Person;Place;Situation Person: Oriented Place: Oriented Situation: Oriented Memory: Impaired Memory Impairment: Decreased short term memory Awareness: Impaired Awareness Impairment: Intellectual impairment;Emergent impairment Problem Solving: Impaired Problem Solving Impairment: Functional basic Safety/Judgment: Impaired Brief Interview for Mental Status (BIMS) Repetition of Three Words (First Attempt): 3 Temporal Orientation: Year: Correct Temporal Orientation: Month: Accurate within 5 days Temporal Orientation: Day: Correct Recall: "Sock": Yes, after cueing ("something to wear") Recall: "Blue": Yes, no cue required Recall: "Bed": Yes, after cueing ("a piece of  furniture") BIMS Summary Score: 13 Sensation Sensation Light Touch: Appears Intact Hot/Cold: Appears Intact Proprioception: Impaired by gross assessment Stereognosis: Not tested Coordination Gross Motor Movements are Fluid and Coordinated: No Fine Motor Movements are Fluid and Coordinated: No Finger Nose Finger Test: labored and slow on the R but accurate, less slow on the L but slower than avg of 10x in 10 seconds ( pt complete 6 on R and 8 on L) Motor  Motor Motor: Hemiplegia;Ataxia  Trunk/Postural Assessment  Postural Control Postural Control: Deficits on evaluation Trunk Control: pt has difficulty maintaining trunk control with reaching out of base of support, slight R lean in sitting.  Balance Static Sitting Balance Static Sitting - Level of Assistance: 4: Min assist (CGA) Dynamic Sitting Balance Dynamic Sitting - Level of Assistance: 4: Min assist;3: Mod assist Static Standing Balance Static Standing - Level of Assistance: 3: Mod assist Dynamic Standing Balance Dynamic Standing - Level of Assistance: Not tested (comment) Extremity/Trunk Assessment RUE Assessment Active Range of Motion (AROM) Comments: sh flexion to 160 General Strength Comments: sh flexion 4/5, elbow flex 4-/5, grasp 4/5 LUE Assessment LUE Assessment: Within Functional Limits  Care Tool Care Tool Self Care Eating   Eating Assist Level: Set up assist    Oral Care    Oral Care Assist Level: Set up assist    Bathing   Body parts bathed by patient: Right arm;Left arm;Chest;Abdomen;Front perineal area;Right upper leg;Left upper leg;Face Body parts bathed by helper: Buttocks;Left lower leg;Right lower leg   Assist Level: Moderate Assistance - Patient 50 - 74%    Upper Body Dressing(including orthotics)   What is the patient wearing?: Pull over shirt   Assist Level: Minimal Assistance - Patient > 75%    Lower Body Dressing (excluding footwear)   What is the patient wearing?: Pants Assist for  lower body dressing: Moderate Assistance - Patient 50 - 74%    Putting on/Taking off footwear   What is the patient wearing?: Non-skid slipper socks Assist for footwear: Minimal Assistance - Patient > 75%       Care Tool Toileting Toileting activity   Assist for toileting: Moderate Assistance - Patient 50 - 74%     Care Tool Bed Mobility Roll left and right activity   Roll left and right assist level: Moderate Assistance - Patient 50 -  74%    Sit to lying activity   Sit to lying assist level: Moderate Assistance - Patient 50 - 74%    Lying to sitting on side of bed activity   Lying to sitting on side of bed assist level: the ability to move from lying on the back to sitting on the side of the bed with no back support.: Moderate Assistance - Patient 50 - 74%     Care Tool Transfers Sit to stand transfer   Sit to stand assist level: Minimal Assistance - Patient > 75%    Chair/bed transfer   Chair/bed transfer assist level: Minimal Assistance - Patient > 75%     Toilet transfer   Assist Level: Minimal Assistance - Patient > 75%     Care Tool Cognition  Expression of Ideas and Wants Expression of Ideas and Wants: 4. Without difficulty (complex and basic) - expresses complex messages without difficulty and with speech that is clear and easy to understand  Understanding Verbal and Non-Verbal Content Understanding Verbal and Non-Verbal Content: 4. Understands (complex and basic) - clear comprehension without cues or repetitions   Memory/Recall Ability Memory/Recall Ability : Current season;That he or she is in a hospital/hospital unit   Refer to Care Plan for Long Term Goals  SHORT TERM GOAL WEEK 1 OT Short Term Goal 1 (Week 1): Pt will demonstrate improved R side coordination and awareness to don a shirt with set up. OT Short Term Goal 2 (Week 1): Pt will demonstrate improved R side coordination to eat independently without set up A. OT Short Term Goal 3 (Week 1): Pt will  demonstrate improved static stand balance to stand at toilet with min A while he adjusts clothing over hips. OT Short Term Goal 4 (Week 1): Pt will demonstrate improved motor planning to be able to get to side of bed from supine with only min cues.  Recommendations for other services: Other: vestibular eval    Skilled Therapeutic Intervention ADL ADL Eating: Set up Grooming: Setup Upper Body Bathing: Supervision/safety Lower Body Bathing: Moderate assistance Upper Body Dressing: Minimal assistance Lower Body Dressing: Moderate assistance Toileting: Moderate assistance Where Assessed-Toileting: Teacher, adult education: Curator Method: Ambulance person: Grab bars   Visit 1: no c/o pain Pt seen for initial evaluation and ADL training with a focus on safe mobility and balance.  Reviewed role of OT, discussed POC and pt's goals, and ELOS. Pt's friend was present the first half of the session and pt was agreeable to having his friend be present during the eval.  Pt demonstrated motor planning deficits and decreased attention  to R side during bed mobility tasks and had difficulty with postural control EOB.  Had pt stand up to RW but R knee kept buckling and on 2nd attempt with RW, he demonstrated proprioceptive impairments with R leg with stepping to chair.  Instead had him do a squat pivot which worked well as he pivoted with min A.  Pt then started feeling dizzy 8/10, rested and he felt it was only 2/10 dizziness. BP WNL.  Pt did complete toilet transfers with bar for support and toileting and still felt a little too dizzy to take a shower. Pt requested to lay down. Mod A to move smoothly from sit to supine. Pt resting in bed With all needs met and bed alarm set.     Visit 2: no c/o pain  Pt received in bed stating he felt better and ready  for a shower. His sister was present at start of session.  Nicoderm patch covered by tegaderm.  Pt did better  this session with following cues and integrating his R side to sit to EOB with min A.  Pt completed squat pivot to R to wc with min A and cues to lift hips.   He then transferred into shower with squat pivot.  Once on bench, he was able to hold static balance with light CGA and dynamic when crossing legs and lateral leans with min A.  Pt able to reach most of his body. Used lateral leans to wash bottom. Transferred back to w/c to dress.  He opted to keep tshirt off due to c/o feeling hot.  Got his pants over feet with CGA when reaching forward in wc , mod A to pull over hips. Pt requested to get back to bed.  Pt resting in bed with all needs met. Alarm set and call light in reach.     Discharge Criteria: Patient will be discharged from OT if patient refuses treatment 3 consecutive times without medical reason, if treatment goals not met, if there is a change in medical status, if patient makes no progress towards goals or if patient is discharged from hospital.  The above assessment, treatment plan, treatment alternatives and goals were discussed and mutually agreed upon: by patient  [facility] 08/14/2022, 12:55 PM

## 2022-08-14 NOTE — Evaluation (Signed)
Physical Therapy Assessment and Plan  Patient Details  Name: Anthony Woods MRN: [id number] Date of Birth: 1956/02/20  PT Diagnosis: Coordination disorder, Difficulty walking, Hemiparesis dominant, Impaired cognition, Muscle weakness, and Pain in joint Rehab Potential: Fair ELOS: 14-18 days   Today's Date: 08/14/2022 PT Individual Time: 0918-1020 PT Individual Time Calculation (min): 62 min    Hospital Problem: Principal Problem:   Acute embolic stroke [facility])   Past Medical History:  Past Medical History:  Diagnosis Date   Allergy to bee sting    Anxiety    Arthritis    "right knee" (05/12/2016)   CAD (coronary artery disease)    a. DES to LCx 04/2016. b. DES to prox Cx 08/2017. c. Stable cath 09/2018 with mild nonobstructive residual disease, normal EF.   Headache    "when his sugar goes too high or too low" (05/12/2016)   HTN (hypertension)    Hyperlipidemia    Hypothyroidism    Macular degeneration    Obesity    PAF (paroxysmal atrial fibrillation) (HCC)    Type II diabetes mellitus (HCC)    Past Surgical History:  Past Surgical History:  Procedure Laterality Date   CARDIAC CATHETERIZATION     COLONOSCOPY     CORONARY ANGIOPLASTY WITH STENT PLACEMENT  05/12/2016   CORONARY STENT INTERVENTION N/A 05/12/2016   Procedure: Coronary Stent Intervention;  Surgeon: Corky Crafts, MD;  Location: [facility] INVASIVE CV LAB;  Service: Cardiovascular;  Laterality: N/A;   CORONARY STENT INTERVENTION N/A 08/31/2017   Procedure: CORONARY STENT INTERVENTION;  Surgeon: Corky Crafts, MD;  Location: [facility] INVASIVE CV LAB;  Service: Cardiovascular;  Laterality: N/A;   GANGLION CYST EXCISION Left 1980s   KNEE ARTHROSCOPY Right    Meniscus removal   LEFT HEART CATH AND CORONARY ANGIOGRAPHY N/A 05/12/2016   Procedure: Left Heart Cath and Coronary Angiography;  Surgeon: Corky Crafts, MD;  Location: [facility] INVASIVE CV LAB;  Service: Cardiovascular;  Laterality: N/A;   LEFT HEART CATH AND  CORONARY ANGIOGRAPHY N/A 08/31/2017   Procedure: LEFT HEART CATH AND CORONARY ANGIOGRAPHY;  Surgeon: Corky Crafts, MD;  Location: [facility] INVASIVE CV LAB;  Service: Cardiovascular;  Laterality: N/A;   LEFT HEART CATHETERIZATION WITH CORONARY ANGIOGRAM N/A 02/04/2012   Procedure: LEFT HEART CATHETERIZATION WITH CORONARY ANGIOGRAM;  Surgeon: Kathleene Hazel, MD;  Location: [facility] CATH LAB;  Service: Cardiovascular;  Laterality: N/A;   POLYPECTOMY     RIGHT/LEFT HEART CATH AND CORONARY ANGIOGRAPHY N/A 09/28/2018   Procedure: RIGHT/LEFT HEART CATH AND CORONARY ANGIOGRAPHY;  Surgeon: Kathleene Hazel, MD;  Location: [facility] INVASIVE CV LAB;  Service: Cardiovascular;  Laterality: N/A;    Assessment & Plan Clinical Impression: Patient is a 67 y.o. male who presented initially to the ED on 08/10/2022 complaining of multiple falls. Found to be hyperglycemic. CT scan of right knee/distal femur, CT head and c-spine, pelvic x-rays and chest x-ray performed. Patient discharged home as he did not want to stay for further work-up. Re-presented on 5/22 with increased confusion and hyperglycemia. Patient and wife states he is not compliant with his medications and not been taking any since January of this year. Repeat imaging with CT c-spine, head and left foot and rib x-rays performed. A1c 15.5%. Xarelto resumed and aspirin 81 mg added. Informed by pharmacy that apixaban will be better covered by patient's insurance. Changed to apixaban.  He was independent with all mobility and ADLs, but has had increased confusion, falls, and difficulty in last few weeks progressing  to the point where his wife is unable to get him up. On 5/23 per OT, max assist for simulated selfcare sit to stand EOB. Increased lean and LOB to the right and posteriorly with dressing tasks. Mod assist for sit to stand with max assist for taking steps forward and backwards with the RW. Increased ataxia noted in standing and with using the RUE. On  dysphagia 3 diet.  MRI showed multiple acute infarcts, a subacute infarct, chronic small vessel ischemic disease and chronic lacunar infarcts.  Patient and wife states he is not compliant with his medications and not been taking any since January of this year. The patient requires inpatient medicine and rehabilitation evaluations and services for ongoing dysfunction secondary to scattered acute ischemic infarcts in the left aspect of the pons.  Patient transferred to CIR on 08/13/2022 .   Patient currently requires mod assist with mobility secondary to muscle weakness, decreased cardiorespiratoy endurance, impaired timing and sequencing, unbalanced muscle activation, decreased coordination, and decreased motor planning, decreased visual acuity, decreased midline orientation, decreased awareness, decreased problem solving, decreased safety awareness, decreased memory, and delayed processing, and decreased sitting balance, decreased standing balance, decreased postural control, decreased balance strategies, and hemipareisis .  Prior to hospitalization, patient was independent  with mobility and lived with Spouse in a Mobile home home.  Home access is 4Stairs to enter.  Patient will benefit from skilled PT intervention to maximize safe functional mobility, minimize fall risk, and decrease caregiver burden for planned discharge home with 24 hour assist.  Anticipate patient will benefit from follow up [facility] at discharge.  PT - End of Session Activity Tolerance: Tolerates 10 - 20 min activity with multiple rests Endurance Deficit: Yes Endurance Deficit Description: pt c/o intermittent dizziness. BP 124/74.  after resting a few minutes in sitting dizziness improves PT Assessment Rehab Potential (ACUTE/IP ONLY): Fair PT Barriers to Discharge: Inaccessible home environment;Decreased caregiver support;Home environment access/layout;Weight;Medication compliance PT Patient demonstrates impairments in the following  area(s): Balance;Edema;Endurance;Motor;Nutrition;Pain;Perception;Safety;Sensory;Skin Integrity PT Transfers Functional Problem(s): Bed Mobility;Bed to Chair;Car;Furniture PT Locomotion Functional Problem(s): Ambulation;Wheelchair Mobility;Stairs PT Plan PT Intensity: Minimum of 1-2 x/day ,45 to 90 minutes PT Frequency: 5 out of 7 days PT Duration Estimated Length of Stay: 14-18 days PT Treatment/Interventions: Ambulation/gait training;Discharge planning;Functional mobility training;Therapeutic Activities;Psychosocial support;Visual/perceptual remediation/compensation;Balance/vestibular training;Disease management/prevention;Neuromuscular re-education;Skin care/wound management;Therapeutic Exercise;Wheelchair propulsion/positioning;Cognitive remediation/compensation;DME/adaptive equipment instruction;Pain management;Splinting/orthotics;UE/LE Strength taining/ROM;Community reintegration;Functional electrical stimulation;Patient/family education;Stair training;UE/LE Coordination activities PT Transfers Anticipated Outcome(s): CGA PT Locomotion Anticipated Outcome(s): CGA PT Recommendation Follow Up Recommendations: Home health PT;Outpatient PT Patient destination: Home Equipment Recommended: To be determined   PT Evaluation Precautions/Restrictions Precautions Precautions: Fall Precaution Comments: R sided reduced coordination, R sided weakness and LLE weakness/reduced motor planning during ambulation Restrictions Weight Bearing Restrictions: No General   Vital Signs  Pain Pain Assessment Pain Scale: 0-10 Pain Score: 0-No pain Pain Interference Pain Interference Pain Effect on Sleep: 0. Does not apply - I have not had any pain or hurting in the past 5 days Pain Interference with Therapy Activities: 1. Rarely or not at all Pain Interference with Day-to-Day Activities: 1. Rarely or not at all Home Living/Prior Functioning Home Living Available Help at Discharge: Available 24  hours/day Type of Home: Mobile home Home Access: Stairs to enter Entrance Stairs-Number of Steps: 4 Entrance Stairs-Rails: Right;Left Home Layout: One level Bathroom Shower/Tub: Health visitor: Standard Bathroom Accessibility: Yes  Lives With: Spouse Prior Function Level of Independence: Independent with basic ADLs;Independent with gait;Independent with homemaking with ambulation;Independent with transfers  Able to Take Stairs?: Yes Driving: Yes Vocation: Retired Vision/Perception  Vision - History Ability to See in Adequate Light: 1 Impaired Vision - Assessment Eye Alignment: Within Functional Limits Ocular Range of Motion: Within Functional Limits Alignment/Gaze Preference: Within Defined Limits Tracking/Visual Pursuits: Decreased smoothness of horizontal tracking;Decreased smoothness of vertical tracking (pt stated he felt dizzy during tracking assessment) Saccades: Decreased speed of saccadic movement (slow with eye shifts to the left) Perception Perception: Impaired Inattention/Neglect: Other (comment) Spatial Orientation: slight R lean in sitting/ standing; slow LOB posteriorly in sitting with no move to self correct Praxis Praxis: Impaired Praxis Impairment Details: Motor planning  Cognition Overall Cognitive Status: Impaired/Different from baseline Arousal/Alertness: Awake/alert Orientation Level: Oriented to person;Oriented to situation;Disoriented to place;Disoriented to time Memory: Impaired Memory Impairment: Decreased short term memory Awareness: Impaired Awareness Impairment: Intellectual impairment;Emergent impairment Problem Solving: Impaired Problem Solving Impairment: Functional basic Safety/Judgment: Impaired Sensation Sensation Light Touch: Appears Intact Hot/Cold: Appears Intact Proprioception: Impaired by gross assessment Stereognosis: Not tested Coordination Gross Motor Movements are Fluid and Coordinated: No Fine Motor  Movements are Fluid and Coordinated: No Finger Nose Finger Test: labored and slow on the R but accurate, less slow on the L but slower than avg of 10x in 10 seconds ( pt complete 6 on R and 8 on L) Heel Shin Test: slow but WFL with RLE, undershoots to arch of foot (not heel) with LLE Motor  Motor Motor: Hemiplegia;Ataxia Motor - Skilled Clinical Observations: decreased coordination with RLE>LLE, and mild decreased coordination with RUE, can improve motor planning with focus   Trunk/Postural Assessment  Cervical Assessment Cervical Assessment: Exceptions to [facility] Thoracic Assessment Thoracic Assessment: Exceptions to [facility] Lumbar Assessment Lumbar Assessment: Exceptions to [facility] Postural Control Postural Control: Deficits on evaluation Trunk Control: difficulty maintaining trunk control with reaching out of BOS, slight R lean in sitting increased in standing, post LOB in sitting with no self correction  Balance Balance Balance Assessed: Yes Static Sitting Balance Static Sitting - Balance Support: Left upper extremity supported;Feet supported Static Sitting - Level of Assistance: 4: Min assist Dynamic Sitting Balance Dynamic Sitting - Level of Assistance: 3: Mod assist Sitting balance - Comments: posterior and right LOB in sitting Static Standing Balance Static Standing - Balance Support: Bilateral upper extremity supported Static Standing - Level of Assistance: 3: Mod assist;4: Min assist Dynamic Standing Balance Dynamic Standing - Balance Support: Bilateral upper extremity supported Dynamic Standing - Level of Assistance: 3: Mod assist Extremity Assessment  RUE Assessment Active Range of Motion (AROM) Comments: sh flexion to 160 General Strength Comments: sh flexion 4/5, elbow flex 4-/5, grasp 4/5 LUE Assessment LUE Assessment: Within Functional Limits RLE Assessment RLE Assessment: Exceptions to [facility] RLE Strength Right Hip Flexion: 3-/5 Right Hip Extension: 3/5 Right Hip  ABduction: 4/5 Right Hip ADduction: 4-/5 Right Knee Flexion: 3+/5 Right Knee Extension: 4/5 Right Ankle Dorsiflexion: 3-/5 Right Ankle Plantar Flexion: 4/5 LLE Assessment LLE Assessment: Exceptions to WFL LLE Strength LLE Overall Strength: Deficits Left Hip Flexion: 4+/5 Left Hip Extension: 4+/5 Left Hip ABduction: 4/5 Left Hip ADduction: 4-/5 Left Knee Flexion: 4/5 Left Knee Extension: 4+/5 Left Ankle Dorsiflexion: 4-/5 Left Ankle Plantar Flexion: 4+/5  Care Tool Care Tool Bed Mobility Roll left and right activity   Roll left and right assist level: Supervision/Verbal cueing    Sit to lying activity   Sit to lying assist level: Contact Guard/Touching assist    Lying to sitting on side of bed activity   Lying to sitting on side  of bed assist level: the ability to move from lying on the back to sitting on the side of the bed with no back support.: Supervision/Verbal cueing     Care Tool Transfers Sit to stand transfer   Sit to stand assist level: Moderate Assistance - Patient 50 - 74%    Chair/bed transfer   Chair/bed transfer assist level: Moderate Assistance - Patient 50 - 74%     Toilet transfer   Assist Level: Moderate Assistance - Patient 50 - 74%    Car transfer   Car transfer assist level: Moderate Assistance - Patient 50 - 74%      Care Tool Locomotion Ambulation   Assist level: Moderate Assistance - Patient 50 - 74%      Walk 10 feet activity   Assist level: Moderate Assistance - Patient - 50 - 74%     Walk 50 feet with 2 turns activity Walk 50 feet with 2 turns activity did not occur: Safety/medical concerns      Walk 150 feet activity Walk 150 feet activity did not occur: Safety/medical concerns      Walk 10 feet on uneven surfaces activity Walk 10 feet on uneven surfaces activity did not occur: Safety/medical concerns      Stairs Stair activity did not occur: Safety/medical concerns        Walk up/down 1 step activity Walk up/down 1 step  or curb (drop down) activity did not occur: Safety/medical concerns      Walk up/down 4 steps activity Walk up/down 4 steps activity did not occur: Safety/medical concerns      Walk up/down 12 steps activity Walk up/down 12 steps activity did not occur: Safety/medical concerns      Pick up small objects from floor Pick up small object from the floor (from standing position) activity did not occur: Safety/medical concerns      Wheelchair Is the patient using a wheelchair?: Yes Type of Wheelchair: Manual   Wheelchair assist level: Dependent - Patient 0% Max wheelchair distance: 300 ft  Wheel 50 feet with 2 turns activity   Assist Level: Dependent - Patient 0%  Wheel 150 feet activity   Assist Level: Dependent - Patient 0%    Refer to Care Plan for Long Term Goals  SHORT TERM GOAL WEEK 1 Week 1:  PT Short Term Goal 1 (Week 1): Pt will perform all bed mobility from flat bed surface with consistent SUP. PT Short Term Goal 2 (Week 1): Pt will perform squat pivot transfer with overall MinA and mod cues for technique. PT Short Term Goal 3 (Week 1): pt will perform stand pivot transfers using LRAD with MinA. PT Short Term Goal 4 (Week 1): Pt will initiate stair training. PT Short Term Goal 5 (Week 1): Pt will ambulate at least 50 ft using LRAD and overall MinA.  Recommendations for other services: Neuropsych  Skilled Therapeutic Intervention Mobility Bed Mobility Bed Mobility: Left Sidelying to Sit;Supine to Sit;Sit to Supine Left Sidelying to Sit: Contact Guard/Touching assist Supine to Sit: Minimal Assistance - Patient > 75% Sit to Supine: Minimal Assistance - Patient > 75% Transfers Transfers: Sit to Stand;Stand to Sit;Stand Radio broadcast assistant Transfers;Lateral/Scoot Transfers Sit to Stand: Moderate Assistance - Patient 50-74% Stand to Sit: Minimal Assistance - Patient > 75% Stand Pivot Transfers: Moderate Assistance - Patient 50 - 74% Stand Pivot Transfer Details:  Verbal cues for sequencing;Tactile cues for weight shifting;Tactile cues for posture;Tactile cues for initiation;Verbal cues for technique;Verbal cues for precautions/safety;Verbal cues  for safe use of DME/AE Squat Pivot Transfers: Moderate Assistance - Patient 50-74% Lateral/Scoot Transfers: Contact Guard/Touching assist Transfer (Assistive device): Rolling walker Locomotion  Gait Ambulation: Yes Gait Assistance: Moderate Assistance - Patient 50-74% Gait Distance (Feet): 35 Feet Assistive device: Rolling walker Gait Assistance Details: Tactile cues for sequencing;Tactile cues for weight shifting;Tactile cues for placement;Tactile cues for weight beaing;Verbal cues for sequencing;Verbal cues for technique;Verbal cues for precautions/safety;Verbal cues for gait pattern;Verbal cues for safe use of DME/AE Gait Gait: Yes Gait Pattern: Right flexed knee in stance;Decreased weight shift to right;Decreased dorsiflexion - right;Poor foot clearance - left;Poor foot clearance - right Gait velocity: decreased Stairs / Additional Locomotion Stairs: No Wheelchair Mobility Wheelchair Mobility: No  Skilled Intervention: PT Evaluation completed; see above for results. PT educated patient in roles of PT vs OT, PT POC, rehab potential, rehab goals, and discharge recommendations along with recommendation for follow-up rehabilitation services. Individual treatment initiated:  Patient supine in bed upon PT arrival. Patient alert and agreeable to PT session. No pain complaint at start of session. Does complain of discomfort d/t need to toilet.   Therapeutic Activity: Bed Mobility: Patient performed supine to/from sit with supervision and max cues for focus on required movements.  Transfers: Patient performed sit <> stand at EOB using bedrail for LUE support in order to manage pants with Mod/ MaxA. Sit<>stand transfers completed with overall ModA to reach full upright stance. Pt seated EOB to use urinal with pt  requiring therapist ot hold in position d/t reduced coordination with RUE. Performs several times. Provided vc/tc for technique including need to sit at EOB, placement of feet on floor with posterior positioning, and forward lean.  Gait Training:  Patient ambulated 35 feet using RW with overall ModA d/t increased lean to R side, guard to R knee to prevent potential give. Required RW for BUE support.  Provided vc/tc for increased hip/ knee flexion to improve foot clearance. PT is slow to process/ motor plan each step progression. Some slide forward of feet noted and increases frequency with fatigue noted ~25 ft.   Neuromuscular Re-ed: NMR facilitated during session with focus onseated and standing balance. Pt guided in finding midline while seated EOB. Mild lean to R and posteriorly with one instance of slow posterior lean and no self correction initiated. Increased lean to R while in standing and requires vc for L side weight shift as well as ModA to complete R knee positioning into TKE. Uncoordinated movements noted in BLE and RUE. Improved performance with focus to required movements to complete task, and decreased coordination with reduced focus and attempts to complete more quickly with PLOF. NMR performed for improvements in motor control and coordination, balance, sequencing, judgement, and self confidence/ efficacy in performing all aspects of mobility at highest level of independence.   Patient supine  in bed at end of session with brakes locked, bed alarm set, and all needs within reach.   Discharge Criteria: Patient will be discharged from PT if patient refuses treatment 3 consecutive times without medical reason, if treatment goals not met, if there is a change in medical status, if patient makes no progress towards goals or if patient is discharged from hospital.  The above assessment, treatment plan, treatment alternatives and goals were discussed and mutually agreed upon: by  patient  Loel Dubonnet PT, DPT, CSRS 08/14/2022, 1:25 PM

## 2022-08-14 NOTE — Progress Notes (Signed)
Ok to replace k+ 3.3 with Kdur x1 per Dr Wynn Banker.  Ulyses Southward, PharmD, BCIDP, AAHIVP, CPP Infectious Disease Pharmacist 08/14/2022 4:50 PM

## 2022-08-14 NOTE — Progress Notes (Addendum)
PROGRESS NOTE   Subjective/Complaints:  No complaints today.  Patient states he went to the bathroom with staff assistance earlier today.  OT eval pending No pain issues Review of systems negative bowel or bladder issues no breathing problems  Objective:   ECHOCARDIOGRAM COMPLETE  Result Date: 08/13/2022    ECHOCARDIOGRAM REPORT   Patient Name:   Anthony Woods Date of Exam: 08/13/2022 Medical Rec #:  [id number]      Height:       65.0 in Accession #:    [id number]     Weight:       209.4 lb Date of Birth:  1955/06/29      BSA:          2.017 m Patient Age:    67 years       BP:           115/75 mmHg Patient Gender: M              HR:           85 bpm. Exam Location:  Inpatient Procedure: 2D Echo, Cardiac Doppler and Color Doppler Indications:    Stroke I63.9  History:        Patient has prior history of Echocardiogram examinations, most                 recent 09/08/2018. Angina, Stroke and COPD, Arrythmias:Atrial                 Fibrillation, Signs/Symptoms:Chest Pain and Dyspnea; Risk                 Factors:Hypertension, Diabetes and Current Smoker.  Sonographer:    Lucendia Herrlich Referring Phys: [id number] JENNIFER YATES  Sonographer Comments: Image acquisition challenging due to uncooperative patient. IMPRESSIONS  1. Technically difficult study with limited visualization of cardiac structures.  2. Left ventricular ejection fraction, by estimation, is 60 to 65%. The left ventricle has normal function. The left ventricle has no regional wall motion abnormalities. There is mild concentric left ventricular hypertrophy. Left ventricular diastolic function could not be evaluated.  3. Right ventricular systolic function is normal. The right ventricular size is normal.  4. The mitral valve is normal in structure. No evidence of mitral valve regurgitation. No evidence of mitral stenosis.  5. The aortic valve is normal in structure. Aortic valve regurgitation  is not visualized. No aortic stenosis is present.  6. The inferior vena cava is normal in size with greater than 50% respiratory variability, suggesting right atrial pressure of 3 mmHg. FINDINGS  Left Ventricle: Left ventricular ejection fraction, by estimation, is 60 to 65%. The left ventricle has normal function. The left ventricle has no regional wall motion abnormalities. The left ventricular internal cavity size was normal in size. There is  mild concentric left ventricular hypertrophy. Left ventricular diastolic function could not be evaluated. Right Ventricle: The right ventricular size is normal. No increase in right ventricular wall thickness. Right ventricular systolic function is normal. Left Atrium: Left atrial size was normal in size. Right Atrium: Right atrial size was normal in size. Pericardium: There is no evidence of pericardial effusion. Mitral Valve: The mitral valve  is normal in structure. No evidence of mitral valve regurgitation. No evidence of mitral valve stenosis. Tricuspid Valve: The tricuspid valve is normal in structure. Tricuspid valve regurgitation is not demonstrated. No evidence of tricuspid stenosis. Aortic Valve: The aortic valve is normal in structure. Aortic valve regurgitation is not visualized. No aortic stenosis is present. Pulmonic Valve: The pulmonic valve was not well visualized. Pulmonic valve regurgitation is not visualized. No evidence of pulmonic stenosis. Aorta: The aortic root is normal in size and structure. Venous: The inferior vena cava is normal in size with greater than 50% respiratory variability, suggesting right atrial pressure of 3 mmHg. IAS/Shunts: No atrial level shunt detected by color flow Doppler.  LEFT VENTRICLE PLAX 2D LVIDd:         4.30 cm LVIDs:         3.10 cm LV PW:         1.20 cm LV IVS:        1.30 cm LVOT diam:     2.30 cm LVOT Area:     4.15 cm  IVC IVC diam: 1.60 cm LEFT ATRIUM         Index LA diam:    3.80 cm 1.88 cm/m   AORTA Ao Root  diam: 3.60 cm MITRAL VALVE MV Area (PHT): 2.87 cm    SHUNTS MV Decel Time: 264 msec    Systemic Diam: 2.30 cm MV E velocity: 48.70 cm/s MV A velocity: 65.60 cm/s MV E/A ratio:  0.74 Aditya Sabharwal Electronically signed by Dorthula Nettles Signature Date/Time: 08/13/2022/1:41:51 PM    Final    DG Swallowing Func-Speech Pathology  Result Date: 08/13/2022 Table formatting from the original result was not included. Modified Barium Swallow Study Patient Details Name: Anthony Woods MRN: [id number] Date of Birth: 01-09-1956 Today's Date: 08/13/2022 HPI/PMH: HPI: The pt is a 67 yo male presenting 5/22 with increased confusion and frequent falls.     Acute infarcts within the left aspect of the pons, bilateral  thalami and bilateral parietal lobes. The largest infarct is located  within the left aspect of the pons, measuring 16 x 8 mm.  Additionally, there is a 3 mm acute/early subacute infarct within  the mid-to-posterior left frontal lobe white matter. Involvement of  multiple vascular territories, suspicious for an embolic process. Clinical Impression: Pt demonstrates mild oropharyngeal dysphagia, noted to have a slightly late swallow initaition at times, likely accounting for instances of cough seen in clinical swallow eval. However, today despite being pushed to drink consecutive large quantites via straw pt had no aspiration and no oral or pharyngeal weakness with residue. Pt has one tooth and no dentures and at baseline can masticate with or without dentures. He demonstrated this ability today. Also despite moderate dysarthria, there was no focal lingual weakness and no significant oral dysphagia. Pt may resume a mechanical soft diet and thin liquids with aspiration precautions. SLP will check in on pts tolerance of meals at least once. Factors that may increase risk of adverse event in presence of aspiration Rubye Oaks & Clearance Coots 2021): No data recorded Recommendations/Plan: Swallowing Evaluation Recommendations  Swallowing Evaluation Recommendations Recommendations: PO diet PO Diet Recommendation: Dysphagia 3 (Mechanical soft); Thin liquids (Level 0) Liquid Administration via: Cup; Straw Medication Administration: Whole meds with liquid Supervision: Staff to assist with self-feeding Postural changes: Position pt fully upright for meals; Stay upright 30-60 min after meals Treatment Plan Treatment Plan Treatment recommendations: Therapy as outlined in treatment plan below Treatment frequency: Min 1x/week Treatment duration:  1 week Recommendations Recommendations for follow up therapy are one component of a multi-disciplinary discharge planning process, led by the attending physician.  Recommendations may be updated based on patient status, additional functional criteria and insurance authorization. Assessment: Orofacial Exam: Orofacial Exam Oral Cavity: Oral Hygiene: WFL Oral Cavity - Dentition: Edentulous Anatomy: Anatomy: WFL; Suspected cervical osteophytes Boluses Administered: Boluses Administered Boluses Administered: Thin liquids (Level 0); Puree; Solid; Mildly thick liquids (Level 2, nectar thick); Moderately thick liquids (Level 3, honey thick)  Oral Impairment Domain: Oral Impairment Domain Lip Closure: Interlabial escape, no progression to anterior lip Tongue control during bolus hold: Cohesive bolus between tongue to palatal seal Bolus preparation/mastication: Slow prolonged chewing/mashing with complete recollection Bolus transport/lingual motion: Brisk tongue motion Oral residue: Complete oral clearance Location of oral residue : N/A Initiation of pharyngeal swallow : Pyriform sinuses  Pharyngeal Impairment Domain: Pharyngeal Impairment Domain Soft palate elevation: No bolus between soft palate (SP)/pharyngeal wall (PW) Laryngeal elevation: Complete superior movement of thyroid cartilage with complete approximation of arytenoids to epiglottic petiole Anterior hyoid excursion: Complete anterior movement Epiglottic  movement: Complete inversion Laryngeal vestibule closure: Complete, no air/contrast in laryngeal vestibule Pharyngeal stripping wave : Present - complete Pharyngoesophageal segment opening: Complete distension and complete duration, no obstruction of flow Tongue base retraction: No contrast between tongue base and posterior pharyngeal wall (PPW) Pharyngeal residue: Complete pharyngeal clearance  Esophageal Impairment Domain: No data recorded Pill: No data recorded Penetration/Aspiration Scale Score: Penetration/Aspiration Scale Score 1.  Material does not enter airway: Thin liquids (Level 0); Mildly thick liquids (Level 2, nectar thick); Moderately thick liquids (Level 3, honey thick); Puree; Solid Compensatory Strategies: No data recorded  General Information: Caregiver present: No  Diet Prior to this Study: NPO   No data recorded  No data recorded  No data recorded  No data recorded Behavior/Cognition: Alert; Cooperative; Pleasant mood Self-Feeding Abilities: Needs assist with self-feeding Baseline vocal quality/speech: Normal Volitional Cough: Unable to elicit Volitional Swallow: Able to elicit Exam Limitations: No limitations Goal Planning: No data recorded No data recorded No data recorded No data recorded No data recorded Pain: Pain Assessment Pain Assessment: Faces Faces Pain Scale: 4 Pain Location: right knee Pain Descriptors / Indicators: Discomfort Pain Intervention(s): Limited activity within patient's tolerance; Repositioned End of Session: Start Time:SLP Start Time (ACUTE ONLY): 0945 Stop Time: SLP Stop Time (ACUTE ONLY): 1000 Time Calculation:SLP Time Calculation (min) (ACUTE ONLY): 15 min Charges: SLP Evaluations $ SLP Speech Visit: 1 Visit SLP Evaluations $BSS Swallow: 1 Procedure $MBS Swallow: 1 Procedure SLP visit diagnosis: SLP Visit Diagnosis: Dysphagia, unspecified (R13.10) Past Medical History: Past Medical History: Diagnosis Date  Allergy to bee sting   Anxiety   Arthritis   "right knee"  (05/12/2016)  CAD (coronary artery disease)   a. DES to LCx 04/2016. b. DES to prox Cx 08/2017. c. Stable cath 09/2018 with mild nonobstructive residual disease, normal EF.  Headache   "when his sugar goes too high or too low" (05/12/2016)  HTN (hypertension)   Hyperlipidemia   Hypothyroidism   Macular degeneration   Obesity   PAF (paroxysmal atrial fibrillation) (HCC)   Type II diabetes mellitus (HCC)  Past Surgical History: Past Surgical History: Procedure Laterality Date  CARDIAC CATHETERIZATION    COLONOSCOPY    CORONARY ANGIOPLASTY WITH STENT PLACEMENT  05/12/2016  CORONARY STENT INTERVENTION N/A 05/12/2016  Procedure: Coronary Stent Intervention;  Surgeon: Corky Crafts, MD;  Location: [facility] INVASIVE CV LAB;  Service: Cardiovascular;  Laterality: N/A;  CORONARY STENT INTERVENTION  N/A 08/31/2017  Procedure: CORONARY STENT INTERVENTION;  Surgeon: Corky Crafts, MD;  Location: [facility] INVASIVE CV LAB;  Service: Cardiovascular;  Laterality: N/A;  GANGLION CYST EXCISION Left 1980s  KNEE ARTHROSCOPY Right   Meniscus removal  LEFT HEART CATH AND CORONARY ANGIOGRAPHY N/A 05/12/2016  Procedure: Left Heart Cath and Coronary Angiography;  Surgeon: Corky Crafts, MD;  Location: [facility] INVASIVE CV LAB;  Service: Cardiovascular;  Laterality: N/A;  LEFT HEART CATH AND CORONARY ANGIOGRAPHY N/A 08/31/2017  Procedure: LEFT HEART CATH AND CORONARY ANGIOGRAPHY;  Surgeon: Corky Crafts, MD;  Location: [facility] INVASIVE CV LAB;  Service: Cardiovascular;  Laterality: N/A;  LEFT HEART CATHETERIZATION WITH CORONARY ANGIOGRAM N/A 02/04/2012  Procedure: LEFT HEART CATHETERIZATION WITH CORONARY ANGIOGRAM;  Surgeon: Kathleene Hazel, MD;  Location: [facility] CATH LAB;  Service: Cardiovascular;  Laterality: N/A;  POLYPECTOMY    RIGHT/LEFT HEART CATH AND CORONARY ANGIOGRAPHY N/A 09/28/2018  Procedure: RIGHT/LEFT HEART CATH AND CORONARY ANGIOGRAPHY;  Surgeon: Kathleene Hazel, MD;  Location: [facility] INVASIVE CV LAB;  Service: Cardiovascular;   Laterality: N/A; [place], Riley Nearing 08/13/2022, 10:38 AM  CT ANGIO HEAD NECK W WO CM  Result Date: 08/12/2022 CLINICAL DATA:  Stroke/TIA, determine embolic source EXAM: CT ANGIOGRAPHY HEAD AND NECK WITH AND WITHOUT CONTRAST TECHNIQUE: Multidetector CT imaging of the head and neck was performed using the standard protocol during bolus administration of intravenous contrast. Multiplanar CT image reconstructions and MIPs were obtained to evaluate the vascular anatomy. Carotid stenosis measurements (when applicable) are obtained utilizing NASCET criteria, using the distal internal carotid diameter as the denominator. RADIATION DOSE REDUCTION: This exam was performed according to the departmental dose-optimization program which includes automated exposure control, adjustment of the mA and/or kV according to patient size and/or use of iterative reconstruction technique. CONTRAST:  75mL OMNIPAQUE IOHEXOL 350 MG/ML SOLN COMPARISON:  Same day MRI head. FINDINGS: CT HEAD FINDINGS Brain: No acute infarcts better characterized on same day MRI head. No progressive mass effect or acute hemorrhage. Chronic microvascular ischemic disease. No midline shift, mass lesion or hydrocephalus. Vascular: See below. Skull: No acute fracture. Sinuses/Orbits: Clear sinuses.  No acute orbital findings. Other: No mastoid effusions. Review of the MIP images confirms the above findings CTA NECK FINDINGS Aortic arch: Great vessel origins are patent without significant stenosis. Calcific atherosclerosis. Right carotid system: Atherosclerosis at the carotid bifurcation with approximately 40% stenosis. Left carotid system: Atherosclerosis of the carotid bifurcation without greater than 50% stenosis Vertebral arteries: Focal occlusion versus severe/critical stenosis of the left vertebral artery origin with more distal opacification. Moderate right vertebral artery origin stenosis. Remainder of the vertebral arteries are patent bilaterally.  Skeleton: No acute abnormality limited assessment. Other neck: No acute abnormality on limited assessment. Upper chest: Visualized lung apices are clear. Review of the MIP images confirms the above findings CTA HEAD FINDINGS Anterior circulation: Bilateral intracranial ICAs, MCAs and ACAs are patent without proximal hemodynamically significant stenosis. Posterior circulation: Bilateral intradural vertebral arteries, basilar artery and bilateral posterior cerebral arteries are patent without proximally normally in stenosis. Venous sinuses: As permitted by contrast timing, patent. Review of the MIP images confirms the above findings IMPRESSION: 1. Focal occlusion versus severe/critical stenosis of the left vertebral artery origin with more distal opacification. 2. Moderate right vertebral artery origin stenosis. 3. Approximately 40% stenosis of the right ICA origin in the neck. 4.  Aortic Atherosclerosis (ICD10-I70.0). Electronically Signed   By: Feliberto Harts M.D.   On: 08/12/2022 14:40   Recent Labs    08/11/22  1417 08/11/22 1432 08/14/22 0525  WBC 8.3  --  7.8  HGB 16.4 16.3 16.0  HCT 47.5 48.0 46.1  PLT 232  --  214   Recent Labs    08/12/22 1106 08/14/22 0525  NA 135 138  K 3.9 3.3*  CL 98 99  CO2 22 27  GLUCOSE 316* 225*  BUN 17 21  CREATININE 1.14 0.84  CALCIUM 10.5* 10.1    Intake/Output Summary (Last 24 hours) at 08/14/2022 1150 Last data filed at 08/14/2022 0835 Gross per 24 hour  Intake 480 ml  Output 800 ml  Net -320 ml        Physical Exam: Vital Signs Blood pressure 122/86, pulse 73, temperature 99 F (37.2 C), resp. rate 16, height 5\' 6"  (1.676 m), weight 77 kg, SpO2 98 %.   General: No acute distress Mood and affect are appropriate Heart: Regular rate and rhythm no rubs murmurs or extra sounds Lungs: Clear to auscultation, breathing unlabored, no rales or wheezes Abdomen: Positive bowel sounds, soft nontender to palpation, nondistended Extremities: No  clubbing, cyanosis, or edema Skin: No evidence of breakdown, no evidence of rash Neurologic: Cranial nerves II through XII intact, motor strength is 4/5 in bilateral deltoid, bicep, tricep, grip, 3- bilat hip flexor, knee extensors, ankle dorsiflexor and plantar flexor Sensory exam normal sensation to light touch and proprioception in bilateral upper and lower extremities Cerebellar exam, mild to moderate dysmetria finger to nose to finger, lower extremity not tested secondary to weakness musculoskeletal: Full range of motion in all 4 extremities. No joint swelling   Assessment/Plan: 1. Functional deficits which require 3+ hours per day of interdisciplinary therapy in a comprehensive inpatient rehab setting. Physiatrist is providing close team supervision and 24 hour management of active medical problems listed below. Physiatrist and rehab team continue to assess barriers to discharge/monitor patient progress toward functional and medical goals  Care Tool:  Bathing              Bathing assist       Upper Body Dressing/Undressing Upper body dressing        Upper body assist      Lower Body Dressing/Undressing Lower body dressing            Lower body assist       Toileting Toileting    Toileting assist       Transfers Chair/bed transfer  Transfers assist           Locomotion Ambulation   Ambulation assist              Walk 10 feet activity   Assist           Walk 50 feet activity   Assist           Walk 150 feet activity   Assist           Walk 10 feet on uneven surface  activity   Assist           Wheelchair     Assist               Wheelchair 50 feet with 2 turns activity    Assist            Wheelchair 150 feet activity     Assist          Blood pressure 122/86, pulse 73, temperature 99 F (37.2 C), resp. rate 16, height 5\' 6"  (1.676 m), weight 77 kg, SpO2 98 %.  Medical  Problem List and Plan: 1. Functional deficits secondary to left pontine and bilateral thalamic, parietal embolic strokes             -patient may shower             -ELOS/Goals: 10 to 14 days, supervision goals PT/OT/SLP   2.  Antithrombotics: -DVT/anticoagulation:  Eliquis             -antiplatelet therapy: aspirin    3. Pain Management: Tylenol, Robaxin as needed   4. Mood/Behavior/Sleep: LCSW to evaluate and provide emotional support             -continue Lexapro             -antipsychotic agents: n/a   5. Neuropsych/cognition: This patient is not capable of making decisions on his own behalf.   6. Skin/Wound Care: Routine skin care checks   7. Fluids/Electrolytes/Nutrition/aphasia: Routine Is and Os and follow-up chemistries -On modified diet, SLP eval pending   8: Hypertension: monitor TID and prn; at home on tenoretic             -continue atenolol 50 mg daily   9: Hyperlipidemia: continue statin, Zetia   10: CAD s/p DES, 2018, 2019             -continue aspirin and statin, Zetia, BB   11: pAF: continue Xarelto and BB   12: DM-2: at home on metformin 1000 mg BID, Humalog 75/25 40 units, Ozempic             -continue SSI, monitor intake, CBGs QID             -Add carb controlled to dysphagia diet CBG (last 3)  Recent Labs    08/13/22 2129 08/14/22 0631 08/14/22 1206  GLUCAP 288* 246* 252*  Is not on any hypoglycemic agents, will restart metformin 1000 mg twice daily and monitor   13: Tobacco use: cessation counseling; continue Nicoderm   14: Hypothyroidism: continue Synthroid   15: Obesity: dietary/exercise counseling    16: Left foot pain: possible non-displaced fracture vs. osseous remolding 5th toe             -continue Darco shoe, buddy wrap                                               LOS: 1 days A FACE TO FACE EVALUATION WAS PERFORMED  Erick Colace 08/14/2022, 11:50 AM

## 2022-08-15 LAB — GLUCOSE, CAPILLARY
Glucose-Capillary: 279 mg/dL — ABNORMAL HIGH (ref 70–99)
Glucose-Capillary: 196 mg/dL — ABNORMAL HIGH (ref 70–99)
Glucose-Capillary: 221 mg/dL — ABNORMAL HIGH (ref 70–99)
Glucose-Capillary: 200 mg/dL — ABNORMAL HIGH (ref 70–99)

## 2022-08-15 NOTE — Evaluation (Signed)
Speech Language Pathology Assessment and Plan  Patient Details  Name: Anthony Woods MRN: [id number] Date of Birth: 23-Dec-1955  SLP Diagnosis: Dysphagia;Cognitive Impairments;Dysarthria  Rehab Potential: Excellent ELOS: 14-16 days    Today's Date: 08/15/2022 SLP Individual Time: 0725-0822 SLP Individual Time Calculation (min): 57 min   Hospital Problem: Principal Problem:   Acute embolic stroke [facility])  Past Medical History:  Past Medical History:  Diagnosis Date   Allergy to bee sting    Anxiety    Arthritis    "right knee" (05/12/2016)   CAD (coronary artery disease)    a. DES to LCx 04/2016. b. DES to prox Cx 08/2017. c. Stable cath 09/2018 with mild nonobstructive residual disease, normal EF.   Headache    "when his sugar goes too high or too low" (05/12/2016)   HTN (hypertension)    Hyperlipidemia    Hypothyroidism    Macular degeneration    Obesity    PAF (paroxysmal atrial fibrillation) (HCC)    Type II diabetes mellitus (HCC)    Past Surgical History:  Past Surgical History:  Procedure Laterality Date   CARDIAC CATHETERIZATION     COLONOSCOPY     CORONARY ANGIOPLASTY WITH STENT PLACEMENT  05/12/2016   CORONARY STENT INTERVENTION N/A 05/12/2016   Procedure: Coronary Stent Intervention;  Surgeon: Corky Crafts, MD;  Location: [facility] INVASIVE CV LAB;  Service: Cardiovascular;  Laterality: N/A;   CORONARY STENT INTERVENTION N/A 08/31/2017   Procedure: CORONARY STENT INTERVENTION;  Surgeon: Corky Crafts, MD;  Location: [facility] INVASIVE CV LAB;  Service: Cardiovascular;  Laterality: N/A;   GANGLION CYST EXCISION Left 1980s   KNEE ARTHROSCOPY Right    Meniscus removal   LEFT HEART CATH AND CORONARY ANGIOGRAPHY N/A 05/12/2016   Procedure: Left Heart Cath and Coronary Angiography;  Surgeon: Corky Crafts, MD;  Location: [facility] INVASIVE CV LAB;  Service: Cardiovascular;  Laterality: N/A;   LEFT HEART CATH AND CORONARY ANGIOGRAPHY N/A 08/31/2017   Procedure: LEFT HEART  CATH AND CORONARY ANGIOGRAPHY;  Surgeon: Corky Crafts, MD;  Location: [facility] INVASIVE CV LAB;  Service: Cardiovascular;  Laterality: N/A;   LEFT HEART CATHETERIZATION WITH CORONARY ANGIOGRAM N/A 02/04/2012   Procedure: LEFT HEART CATHETERIZATION WITH CORONARY ANGIOGRAM;  Surgeon: Kathleene Hazel, MD;  Location: [facility] CATH LAB;  Service: Cardiovascular;  Laterality: N/A;   POLYPECTOMY     RIGHT/LEFT HEART CATH AND CORONARY ANGIOGRAPHY N/A 09/28/2018   Procedure: RIGHT/LEFT HEART CATH AND CORONARY ANGIOGRAPHY;  Surgeon: Kathleene Hazel, MD;  Location: [facility] INVASIVE CV LAB;  Service: Cardiovascular;  Laterality: N/A;    Assessment / Plan / Recommendation Clinical Impression Patient is a 67 yo male presenting 5/22 with increased confusion and frequent falls.     Acute infarcts within the left aspect of the pons, bilateral  thalami and bilateral parietal lobes. The largest infarct is located  within the left aspect of the pons, measuring 16 x 8 mm.  Additionally, there is a 3 mm acute/early subacute infarct within  the mid-to-posterior left frontal lobe white matter. Involvement of  multiple vascular territories, suspicious for an embolic process. Therapy evaluations completed with recommendations for CIR. Patient admitted 08/13/22.  Patient observed with breakfast meal of Dys. 3 textures with thin liquids. Patient self-fed with extra time due to decreased coordination. Patient without overt s/s of aspiration but noted to be impulsive with PO intake and with mild oral residue with Dys. 3 textures that cleared with a liquid wash. Recommend patient continue current diet.  SLP administered the COGNISTAT and patient demonstrated moderate impairments in short-term recall and mild impairments in calculations and attention. Patient endorses decreased recall of daily information since admission and demonstrated poor selective attention while the television was on with patient eventually demonstrating  appropriate awareness and requesting to mute the television.   Patient also demonstrates a moderate dysarthria characterized by imprecise consonants due to decreased coordination and increase rate of speech that improved when patient utilized a slow rate and over-articulation.   Patient would benefit from skilled SLP intervention to maximize his cognitive and swallowing function as well as speech intelligibility prior to discharge.       Skilled Therapeutic Interventions          Administered a cognitive-linguistic evaluation and BSE, please see above for details.   SLP Assessment  Patient will need skilled Speech Lanaguage Pathology Services during CIR admission    Recommendations  SLP Diet Recommendations: Dysphagia 3 (Mech soft);Thin Liquid Administration via: Cup;Straw Medication Administration: Whole meds with liquid Supervision: Patient able to self feed Compensations: Minimize environmental distractions;Slow rate;Small sips/bites Postural Changes and/or Swallow Maneuvers: Seated upright 90 degrees Oral Care Recommendations: Oral care BID Recommendations for Other Services: Neuropsych consult Patient destination: Home Follow up Recommendations: 24 hour supervision/assistance;Home Health SLP Equipment Recommended: None recommended by SLP    SLP Frequency 1 to 3 out of 7 days   SLP Duration  SLP Intensity  SLP Treatment/Interventions 14-16 days  Minumum of 1-2 x/day, 30 to 90 minutes  Cognitive remediation/compensation;Cueing hierarchy;Dysphagia/aspiration precaution training;Environmental controls;Functional tasks;Internal/external aids;Patient/family education;Therapeutic Activities;Speech/Language facilitation    Pain 7/10 headache Nursing aware and administered medications    SLP Evaluation Cognition Overall Cognitive Status: Impaired/Different from baseline Arousal/Alertness: Awake/alert Orientation Level: Oriented to person;Oriented to place;Oriented to  situation;Disoriented to time Selective Attention: Impaired Selective Attention Impairment: Functional basic Memory: Impaired Memory Impairment: Decreased short term memory;Storage deficit Decreased Short Term Memory: Verbal basic Awareness: Impaired Awareness Impairment: Emergent impairment Problem Solving: Impaired Problem Solving Impairment: Functional basic Safety/Judgment: Impaired  Comprehension Auditory Comprehension Overall Auditory Comprehension: Appears within functional limits for tasks assessed Expression Expression Primary Mode of Expression: Verbal Verbal Expression Overall Verbal Expression: Appears within functional limits for tasks assessed Oral Motor Oral Motor/Sensory Function Overall Oral Motor/Sensory Function: Within functional limits Motor Speech Overall Motor Speech: Impaired Respiration: Within functional limits Phonation: Normal Resonance: Hypernasality Articulation: Impaired Level of Impairment: Phrase Intelligibility: Intelligibility reduced Word: 75-100% accurate Sentence: 50-74% accurate Conversation: 50-74% accurate Motor Planning: Witnin functional limits Motor Speech Errors: Aware;Consistent Effective Techniques: Slow rate;Over-articulate  Care Tool Care Tool Cognition Ability to hear (with hearing aid or hearing appliances if normally used Ability to hear (with hearing aid or hearing appliances if normally used): 0. Adequate - no difficulty in normal conservation, social interaction, listening to TV   Expression of Ideas and Wants Expression of Ideas and Wants: 3. Some difficulty - exhibits some difficulty with expressing needs and ideas (e.g, some words or finishing thoughts) or speech is not clear   Understanding Verbal and Non-Verbal Content Understanding Verbal and Non-Verbal Content: 4. Understands (complex and basic) - clear comprehension without cues or repetitions  Memory/Recall Ability Memory/Recall Ability : Current season;That  he or she is in a hospital/hospital unit    Bedside Swallowing Assessment General Date of Onset: 08/12/22 Previous Swallow Assessment: MBS 5/24 Diet Prior to this Study: Dysphagia 3 (mechanical soft);Thin liquids (Level 0) Temperature Spikes Noted: No Respiratory Status: Room air History of Recent Intubation: No Behavior/Cognition: Alert;Cooperative;Pleasant mood Oral Cavity -  Dentition: Edentulous Self-Feeding Abilities: Able to feed self;Needs set up Vision: Functional for self-feeding Patient Positioning: Upright in bed Baseline Vocal Quality: Normal Volitional Cough: Strong Volitional Swallow: Able to elicit  Oral Care Assessment Oral Assessment  (WDL): Within Defined Limits Level of Consciousness: Alert Is patient on any of following O2 devices?: None of the above Nutritional status: No high risk factors Oral Assessment Risk : Low Risk Ice Chips Ice chips: Not tested Thin Liquid Thin Liquid: Within functional limits Presentation: Straw Nectar Thick Nectar Thick Liquid: Not tested Honey Thick Honey Thick Liquid: Not tested Puree Puree: Within functional limits Presentation: Self Fed;Spoon Solid Solid: Impaired Presentation: Self Fed;Spoon Oral Phase Impairments: Impaired mastication Oral Phase Functional Implications: Oral residue BSE Assessment Risk for Aspiration Impact on safety and function: Mild aspiration risk  Short Term Goals: Week 1: SLP Short Term Goal 1 (Week 1): Patient will consume current diet with supervision level verbal cues for use of swallowing compensatory strategies. SLP Short Term Goal 2 (Week 1): Patient will utilize speech intelligibility strategies at the sentence level to achieve 90% intelligibility with Min verbal cues. SLP Short Term Goal 3 (Week 1): Patient will demonstrate selective attention for 30 mminutes to functional tasks in a mildly distracting enviornment with Min verbal cues for redirection. SLP Short Term Goal 4 (Week 1):  Patient will demonstrate functional problem solving for mildly complex tasks with Min verbal cues for redirection. SLP Short Term Goal 5 (Week 1): Patient will recall new, daily information with Mod A multimodal cues for use of compensatory strategies.  Refer to Care Plan for Long Term Goals  Recommendations for other services: Neuropsych  Discharge Criteria: Patient will be discharged from SLP if patient refuses treatment 3 consecutive times without medical reason, if treatment goals not met, if there is a change in medical status, if patient makes no progress towards goals or if patient is discharged from hospital.  The above assessment, treatment plan, treatment alternatives and goals were discussed and mutually agreed upon: by patient  Jerrel Tiberio 08/15/2022, 10:56 AM

## 2022-08-15 NOTE — Plan of Care (Signed)
  Problem: RH Swallowing Goal: LTG Patient will consume least restrictive diet using compensatory strategies with assistance (SLP) Description: LTG:  Patient will consume least restrictive diet using compensatory strategies with assistance (SLP) Flowsheets (Taken 08/15/2022 1048) LTG: Pt Patient will consume least restrictive diet using compensatory strategies with assistance of (SLP): Modified Independent   Problem: RH Expression Communication Goal: LTG Patient will increase speech intelligibility (SLP) Description: LTG: Patient will increase speech intelligibility at word/phrase/conversation level with cues, % of the time (SLP) Flowsheets (Taken 08/15/2022 1048) LTG: Patient will increase speech intelligibility (SLP): Supervision   Problem: RH Problem Solving Goal: LTG Patient will demonstrate problem solving for (SLP) Description: LTG:  Patient will demonstrate problem solving for basic/complex daily situations with cues  (SLP) Flowsheets (Taken 08/15/2022 1048) LTG: Patient will demonstrate problem solving for (SLP): Complex daily situations LTG Patient will demonstrate problem solving for: Supervision   Problem: RH Memory Goal: LTG Patient will use memory compensatory aids to (SLP) Description: LTG:  Patient will use memory compensatory aids to recall biographical/new, daily complex information with cues (SLP) Flowsheets (Taken 08/15/2022 1048) LTG: Patient will use memory compensatory aids to (SLP): Minimal Assistance - Patient > 75%   Problem: RH Attention Goal: LTG Patient will demonstrate this level of attention during functional activites (SLP) Description: LTG:  Patient will will demonstrate this level of attention during functional activites (SLP) Flowsheets (Taken 08/15/2022 1048) Patient will demonstrate during cognitive/linguistic activities the attention type of: Selective LTG: Patient will demonstrate this level of attention during cognitive/linguistic activities with  assistance of (SLP): Supervision

## 2022-08-16 DIAGNOSIS — I639 Cerebral infarction, unspecified: Secondary | ICD-10-CM | POA: Diagnosis not present

## 2022-08-16 LAB — BASIC METABOLIC PANEL
Anion gap: 12 (ref 5–15)
BUN: 20 mg/dL (ref 8–23)
CO2: 21 mmol/L — ABNORMAL LOW (ref 22–32)
Calcium: 9.3 mg/dL (ref 8.9–10.3)
Chloride: 102 mmol/L (ref 98–111)
Creatinine, Ser: 0.74 mg/dL (ref 0.61–1.24)
GFR, Estimated: >60 mL/min (ref >60)
Glucose, Bld: 185 mg/dL — ABNORMAL HIGH (ref 70–99)
Potassium: 3.4 mmol/L — ABNORMAL LOW (ref 3.5–5.1)
Sodium: 135 mmol/L (ref 135–145)

## 2022-08-16 LAB — GLUCOSE, CAPILLARY
Glucose-Capillary: 194 mg/dL — ABNORMAL HIGH (ref 70–99)
Glucose-Capillary: 298 mg/dL — ABNORMAL HIGH (ref 70–99)
Glucose-Capillary: 250 mg/dL — ABNORMAL HIGH (ref 70–99)
Glucose-Capillary: 158 mg/dL — ABNORMAL HIGH (ref 70–99)

## 2022-08-16 LAB — CBC
HCT: 44 % (ref 39.0–52.0)
Hemoglobin: 15.2 g/dL (ref 13.0–17.0)
MCH: 31.9 pg (ref 26.0–34.0)
MCHC: 34.5 g/dL (ref 30.0–36.0)
MCV: 92.2 fL (ref 80.0–100.0)
Platelets: 181 10*3/uL (ref 150–400)
RBC: 4.77 MIL/uL (ref 4.22–5.81)
RDW: 12.2 % (ref 11.5–15.5)
WBC: 6.4 10*3/uL (ref 4.0–10.5)
nRBC: 0 % (ref 0.0–0.2)

## 2022-08-16 MED ORDER — ATENOLOL 25 MG PO TABS
25.0000 mg | ORAL_TABLET | Freq: Every day | ORAL | Status: DC
  Administered 2022-08-17: 25 mg via ORAL
  Filled 2022-08-16: qty 1

## 2022-08-16 MED ORDER — POTASSIUM CHLORIDE 20 MEQ PO PACK
40.0000 meq | PACK | Freq: Once | ORAL | Status: AC
  Administered 2022-08-16: 40 meq via ORAL
  Filled 2022-08-16: qty 2

## 2022-08-16 NOTE — Progress Notes (Signed)
Inpatient Rehabilitation Care Coordinator Assessment and Plan Patient Details  Name: Anthony Woods MRN: [id number] Date of Birth: 01-19-1956  Today's Date: 08/16/2022  Hospital Problems: Principal Problem:   Acute embolic stroke [facility])  Past Medical History:  Past Medical History:  Diagnosis Date   Allergy to bee sting    Anxiety    Arthritis    "right knee" (05/12/2016)   CAD (coronary artery disease)    a. DES to LCx 04/2016. b. DES to prox Cx 08/2017. c. Stable cath 09/2018 with mild nonobstructive residual disease, normal EF.   Headache    "when his sugar goes too high or too low" (05/12/2016)   HTN (hypertension)    Hyperlipidemia    Hypothyroidism    Macular degeneration    Obesity    PAF (paroxysmal atrial fibrillation) (HCC)    Type II diabetes mellitus (HCC)    Past Surgical History:  Past Surgical History:  Procedure Laterality Date   CARDIAC CATHETERIZATION     COLONOSCOPY     CORONARY ANGIOPLASTY WITH STENT PLACEMENT  05/12/2016   CORONARY STENT INTERVENTION N/A 05/12/2016   Procedure: Coronary Stent Intervention;  Surgeon: Corky Crafts, MD;  Location: [facility] INVASIVE CV LAB;  Service: Cardiovascular;  Laterality: N/A;   CORONARY STENT INTERVENTION N/A 08/31/2017   Procedure: CORONARY STENT INTERVENTION;  Surgeon: Corky Crafts, MD;  Location: [facility] INVASIVE CV LAB;  Service: Cardiovascular;  Laterality: N/A;   GANGLION CYST EXCISION Left 1980s   KNEE ARTHROSCOPY Right    Meniscus removal   LEFT HEART CATH AND CORONARY ANGIOGRAPHY N/A 05/12/2016   Procedure: Left Heart Cath and Coronary Angiography;  Surgeon: Corky Crafts, MD;  Location: [facility] INVASIVE CV LAB;  Service: Cardiovascular;  Laterality: N/A;   LEFT HEART CATH AND CORONARY ANGIOGRAPHY N/A 08/31/2017   Procedure: LEFT HEART CATH AND CORONARY ANGIOGRAPHY;  Surgeon: Corky Crafts, MD;  Location: [facility] INVASIVE CV LAB;  Service: Cardiovascular;  Laterality: N/A;   LEFT HEART CATHETERIZATION WITH  CORONARY ANGIOGRAM N/A 02/04/2012   Procedure: LEFT HEART CATHETERIZATION WITH CORONARY ANGIOGRAM;  Surgeon: Kathleene Hazel, MD;  Location: [facility] CATH LAB;  Service: Cardiovascular;  Laterality: N/A;   POLYPECTOMY     RIGHT/LEFT HEART CATH AND CORONARY ANGIOGRAPHY N/A 09/28/2018   Procedure: RIGHT/LEFT HEART CATH AND CORONARY ANGIOGRAPHY;  Surgeon: Kathleene Hazel, MD;  Location: [facility] INVASIVE CV LAB;  Service: Cardiovascular;  Laterality: N/A;   Social History:  reports that he has been smoking cigarettes. He has a 126.00 pack-year smoking history. He has never used smokeless tobacco. He reports that he does not drink alcohol and does not use drugs.  Family / Support Systems Marital Status: Married Patient Roles: Spouse, Other (Comment) (sibling) Spouse/Significant Other: Stanton Kidney [phone] Other Supports: Kathy-sister [phone]  Carol-sister [phone] Anticipated Caregiver: wife Ability/Limitations of Caregiver: None Caregiver Availability: 24/7 Family Dynamics: Close knit with wife and siblings, he feels he has good supports via sisters, friends and neighbors.  Social History Preferred language: English Religion: [employer] Cultural Background: No issues Education: HS Health Literacy - How often do you need to have someone help you when you read instructions, pamphlets, or other written material from your doctor or pharmacy?: Never Writes: Yes Employment Status: Retired Date Retired/Disabled/Unemployed: 03/2022 Legal History/Current Legal Issues: No issues Guardian/Conservator: None-according to MD pt is not fully capable of making his own decisions while here. Will look toward his wife to make any decisions while here.   Abuse/Neglect Abuse/Neglect Assessment Can Be Completed: Yes  Physical Abuse: Denies Verbal Abuse: Denies Sexual Abuse: Denies Exploitation of patient/patient's resources: Denies Self-Neglect: Denies  Patient response to: Social Isolation - How often do you  feel lonely or isolated from those around you?: Never  Emotional Status Pt's affect, behavior and adjustment status: Pt is motivated to regain his independence. He has always been independent and able to take care of himself and not rely upon others. He is not one to do this and will find it hard to ask for assist. Recent Psychosocial Issues: other health issues Psychiatric History: history of anxiety takes medications or was suppose to but has been non-complaint with medications since 03/2022 Substance Abuse History: Tobacco aware needs to quit due to health issues. MD has discussed with him the health risks involved with smoking  Patient / Family Perceptions, Expectations & Goals Pt/Family understanding of illness & functional limitations: Pt and wife can explain his stroke and deficits. Both have spoken with the MD and feel they understand his treatment plan while here. Both are hopeful he will do well here. Premorbid pt/family roles/activities: Husband, brother, retiree, neighbor, friend, etc Anticipated changes in roles/activities/participation: resume Pt/family expectations/goals: Pt states: " I  want to do good here."  Wife states: "  I hope he does well he is not one to ask for help."  [employer]: None Premorbid Home Care/DME Agencies: Other (Comment) (rollator, cane, tub seat and rw) Transportation available at discharge: wife Is the patient able to respond to transportation needs?: Yes In the past 12 months, has lack of transportation kept you from medical appointments or from getting medications?: No In the past 12 months, has lack of transportation kept you from meetings, work, or from getting things needed for daily living?: No Resource referrals recommended: Neuropsychology  Discharge Planning Living Arrangements: Spouse/significant other Support Systems: Spouse/significant other, Other relatives, Friends/neighbors Type of Residence: Private  residence Insurance Resources: [employer] Financial Resources: Social Security, Family Support Financial Screen Referred: No Living Expenses: Own Money Management: Patient, Spouse Does the patient have any problems obtaining your medications?: No Home Management: wife Patient/Family Preliminary Plans: Return home with wife who is in good health and can assist him if needed. Pt is hopeful he will do well here and recover from this stroke. Aware of team's goals of CGA-supervision level Care Coordinator Barriers to Discharge: Medication compliance Care Coordinator Anticipated Follow Up Needs: HH/OP  Clinical Impression Pleasant gentleman who is motivated to do well here. His wife is very involved and able to provide 24/7 care if needed. Have placed on neuro-psych list to be seen while here.   Lucy Chris 08/16/2022, 11:05 AM

## 2022-08-16 NOTE — IPOC Note (Signed)
Overall Plan of Care [facility]) Patient Details Name: Anthony Woods MRN: [id number] DOB: 08-11-1955  Admitting Diagnosis: Acute embolic stroke [facility])  Hospital Problems: Principal Problem:   Acute embolic stroke [facility])     Functional Problem List: Nursing Bladder, Bowel, Safety, Endurance, Medication Management, Pain  PT Balance, Edema, Endurance, Motor, Nutrition, Pain, Perception, Safety, Sensory, Skin Integrity  OT Balance, Cognition, Endurance, Motor, Perception, Sensory, Vision  SLP Cognition, Linguistic, Nutrition, Safety  TR         Basic ADL's: OT Grooming, Bathing, Dressing, Toileting, Eating     Advanced  ADL's: OT       Transfers: PT Bed Mobility, Bed to Chair, Car, Occupational psychologist, Research scientist (life sciences): PT Ambulation, Psychologist, prison and probation services, Stairs     Additional Impairments: OT Fuctional Use of Upper Extremity  SLP Social Cognition, Swallowing, Communication expression Problem Solving, Memory, Awareness, Attention  TR      Anticipated Outcomes Item Anticipated Outcome  Self Feeding independent  Swallowing  Mod I   Basic self-care  supervision  Toileting  supervision   Bathroom Transfers supervision  Bowel/Bladder  manage bowel w mod I and bladder w toileting  Transfers  CGA  Locomotion  CGA  Communication  Supervision  Cognition  Min A-Supervision  Pain  < 4 with prns  Safety/Judgment  manage w cues   Therapy Plan: PT Intensity: Minimum of 1-2 x/day ,45 to 90 minutes PT Frequency: 5 out of 7 days PT Duration Estimated Length of Stay: 14-18 days OT Intensity: Minimum of 1-2 x/day, 45 to 90 minutes OT Frequency: 5 out of 7 days OT Duration/Estimated Length of Stay: 14-16 days SLP Intensity: Minumum of 1-2 x/day, 30 to 90 minutes SLP Frequency: 1 to 3 out of 7 days SLP Duration/Estimated Length of Stay: 14-16 days   Team Interventions: Nursing Interventions Bladder Management, Medication Management, Discharge Planning,  Patient/Family Education, Pain Management, Bowel Management, Disease Management/Prevention  PT interventions Ambulation/gait training, Discharge planning, Functional mobility training, Therapeutic Activities, Psychosocial support, Visual/perceptual remediation/compensation, Balance/vestibular training, Disease management/prevention, Neuromuscular re-education, Skin care/wound management, Therapeutic Exercise, Wheelchair propulsion/positioning, Cognitive remediation/compensation, DME/adaptive equipment instruction, Pain management, Splinting/orthotics, UE/LE Strength taining/ROM, Community reintegration, Development worker, international aid stimulation, Patient/family education, Museum/gallery curator, UE/LE Coordination activities  OT Interventions Warden/ranger, Discharge planning, Cognitive remediation/compensation, DME/adaptive equipment instruction, Functional mobility training, Neuromuscular re-education, Psychosocial support, Patient/family education, Self Care/advanced ADL retraining, UE/LE Strength taining/ROM, Therapeutic Exercise, Therapeutic Activities, UE/LE Coordination activities, Visual/perceptual remediation/compensation  SLP Interventions Cognitive remediation/compensation, Cueing hierarchy, Dysphagia/aspiration precaution training, Environmental controls, Functional tasks, Internal/external aids, Patient/family education, Therapeutic Activities, Speech/Language facilitation  TR Interventions    SW/CM Interventions Discharge Planning, Psychosocial Support, Patient/Family Education   Barriers to Discharge MD  Medical stability  Nursing Decreased caregiver support, Home environment access/layout, Medication compliance 1 level 4 ste bil rails w spouse; has DME: RW, rollator, SS,  SPC  PT Inaccessible home environment, Decreased caregiver support, Home environment access/layout, Weight, Medication compliance    OT Decreased caregiver support Pt stated his wife has a lot of physical problems and can  not help physically  SLP      SW       Team Discharge Planning: Destination: PT-Home ,OT- Home , SLP-Home Projected Follow-up: PT-Home health PT, Outpatient PT, OT-  Home health OT, SLP-24 hour supervision/assistance, Home Health SLP Projected Equipment Needs: PT-To be determined, OT- To be determined, SLP-None recommended by SLP Equipment Details: PT- , OT-  Patient/family involved in discharge planning: PT- Patient, Family member/caregiver,  OT-Patient, SLP-Patient  MD ELOS: 10-14 days Medical Rehab Prognosis:  Excellent Assessment: The patient has been admitted for CIR therapies with the diagnosis of acute embolic CVA. The team will be addressing functional mobility, strength, stamina, balance, safety, adaptive techniques and equipment, self-care, bowel and bladder mgt, patient and caregiver education. Goals have been set at supervision. Anticipated discharge destination is home.        See Team Conference Notes for weekly updates to the plan of care

## 2022-08-16 NOTE — Progress Notes (Signed)
PROGRESS NOTE   Subjective/Complaints: C/o difficulties with motor coordination. Discussed how stroke can cause this. Discussed elevated CBGs and provided list of foods for diabetes   Objective:   No results found. Recent Labs    08/14/22 0525 08/16/22 0542  WBC 7.8 6.4  HGB 16.0 15.2  HCT 46.1 44.0  PLT 214 181   Recent Labs    08/14/22 0525 08/16/22 0542  NA 138 135  K 3.3* 3.4*  CL 99 102  CO2 27 21*  GLUCOSE 225* 185*  BUN 21 20  CREATININE 0.84 0.74  CALCIUM 10.1 9.3    Intake/Output Summary (Last 24 hours) at 08/16/2022 0931 Last data filed at 08/16/2022 0318 Gross per 24 hour  Intake 720 ml  Output 400 ml  Net 320 ml        Physical Exam: Vital Signs Blood pressure 111/70, pulse (!) 58, temperature 98 F (36.7 C), temperature source Oral, resp. rate 17, height 5\' 6"  (1.676 m), weight 77 kg, SpO2 93 %. Gen: no distress, normal appearing HEENT: oral mucosa pink and moist, NCAT Cardio: Bradycardia Chest: normal effort, normal rate of breathing Abd: soft, non-distended Ext: no edema Psych: pleasant, normal affect Skin: intact Neurologic: Cranial nerves II through XII intact, motor strength is 4/5 in bilateral deltoid, bicep, tricep, grip, 3- bilat hip flexor, knee extensors, ankle dorsiflexor and plantar flexor Sensory exam normal sensation to light touch and proprioception in bilateral upper and lower extremities Cerebellar exam, mild to moderate dysmetria finger to nose to finger, lower extremity not tested secondary to weakness musculoskeletal: Full range of motion in all 4 extremities. No joint swelling   Assessment/Plan: 1. Functional deficits which require 3+ hours per day of interdisciplinary therapy in a comprehensive inpatient rehab setting. Physiatrist is providing close team supervision and 24 hour management of active medical problems listed below. Physiatrist and rehab team continue  to assess barriers to discharge/monitor patient progress toward functional and medical goals  Care Tool:  Bathing    Body parts bathed by patient: Right arm, Left arm, Chest, Abdomen, Front perineal area, Right upper leg, Left upper leg, Face   Body parts bathed by helper: Buttocks, Left lower leg, Right lower leg     Bathing assist Assist Level: Moderate Assistance - Patient 50 - 74%     Upper Body Dressing/Undressing Upper body dressing   What is the patient wearing?: Pull over shirt    Upper body assist Assist Level: Minimal Assistance - Patient > 75%    Lower Body Dressing/Undressing Lower body dressing      What is the patient wearing?: Pants     Lower body assist Assist for lower body dressing: Moderate Assistance - Patient 50 - 74%     Toileting Toileting    Toileting assist Assist for toileting: Moderate Assistance - Patient 50 - 74%     Transfers Chair/bed transfer  Transfers assist     Chair/bed transfer assist level: Moderate Assistance - Patient 50 - 74%     Locomotion Ambulation   Ambulation assist      Assist level: Moderate Assistance - Patient 50 - 74% Assistive device: Walker-rolling Max distance: 35 ft  Walk 10 feet activity   Assist     Assist level: Moderate Assistance - Patient - 50 - 74% Assistive device: Walker-rolling   Walk 50 feet activity   Assist Walk 50 feet with 2 turns activity did not occur: Safety/medical concerns         Walk 150 feet activity   Assist Walk 150 feet activity did not occur: Safety/medical concerns         Walk 10 feet on uneven surface  activity   Assist Walk 10 feet on uneven surfaces activity did not occur: Safety/medical concerns         Wheelchair     Assist Is the patient using a wheelchair?: Yes Type of Wheelchair: Manual    Wheelchair assist level: Dependent - Patient 0% Max wheelchair distance: 300 ft    Wheelchair 50 feet with 2 turns  activity    Assist        Assist Level: Dependent - Patient 0%   Wheelchair 150 feet activity     Assist      Assist Level: Dependent - Patient 0%   Blood pressure 111/70, pulse (!) 58, temperature 98 F (36.7 C), temperature source Oral, resp. rate 17, height 5\' 6"  (1.676 m), weight 77 kg, SpO2 93 %.    Medical Problem List and Plan: 1. Functional deficits secondary to left pontine and bilateral thalamic, parietal embolic strokes             -patient may shower             -ELOS/Goals: 10 to 14 days, supervision goals PT/OT/SLP   2.  Antithrombotics: -DVT/anticoagulation:  Eliquis             -antiplatelet therapy: aspirin    3. Pain Management: Tylenol, Robaxin as needed   4. Mood/Behavior/Sleep: LCSW to evaluate and provide emotional support             -continue Lexapro             -antipsychotic agents: n/a   5. Neuropsych/cognition: This patient is not capable of making decisions on his own behalf.   6. Skin/Wound Care: Routine skin care checks   7. Fluids/Electrolytes/Nutrition/aphasia: Routine Is and Os and follow-up chemistries -On modified diet, SLP eval pending   8: Hypertension: monitor TID and prn; at home on tenoretic             -continue atenolol 50 mg daily   9: Hyperlipidemia: continue statin, Zetia   10: CAD s/p DES, 2018, 2019             -continue aspirin and statin, Zetia, BB   11: pAF: continue Xarelto and BB   12: DM-2: at home on metformin 1000 mg BID, Humalog 75/25 40 units, Ozempic             -continue SSI, monitor intake, CBGs QID             -Add carb controlled to dysphagia diet CBG (last 3)  Recent Labs (last 2 labs)       Recent Labs    08/13/22 2129 08/14/22 0631 08/14/22 1206  GLUCAP 288* 246* 252*    Is not on any hypoglycemic agents, will restart metformin 1000 mg twice daily and monitor. Educated regarding elevated CBGs and provided list of foods for diabetes.    13: Tobacco use: cessation counseling;  continue Nicoderm   14: Hypothyroidism: continue Synthroid   15: Obesity: dietary/exercise counseling    16:  Left foot pain: possible non-displaced fracture vs. osseous remolding 5th toe             -continue Darco shoe, buddy wrap  17. Bradycardia: decrease atenolol to 25mg   18. Hypokalemia: ordered klor once on 5/27  >50 minutes spent in review of chart and vitals, decreasing atenolol to 25mg  given bradycardia, adding klor supplement for hypokalemia, discussion of elevated CBGs and providing a list of foods for diabetes  LOS: 3 days A FACE TO FACE EVALUATION WAS PERFORMED  Drema Pry Callan Norden 08/16/2022, 9:31 AM

## 2022-08-16 NOTE — Progress Notes (Signed)
Physical Therapy Session Note  Patient Details  Name: Anthony Woods MRN: [id number] Date of Birth: [birth year]  Today's Date: 08/16/2022 PT Individual Time: 1300-1415 PT Individual Time Calculation (min): 75 min   Short Term Goals: Week 1:  PT Short Term Goal 1 (Week 1): Pt will perform all bed mobility from flat bed surface with consistent SUP. PT Short Term Goal 2 (Week 1): Pt will perform squat pivot transfer with overall MinA and mod cues for technique. PT Short Term Goal 3 (Week 1): pt will perform stand pivot transfers using LRAD with MinA. PT Short Term Goal 4 (Week 1): Pt will initiate stair training. PT Short Term Goal 5 (Week 1): Pt will ambulate at least 50 ft using LRAD and overall MinA.  Skilled Therapeutic Interventions/Progress Updates: Pt presents sitting in w/c and agreeable to therapy.  Pt wheeled to dayroom.  Pt transfers throughout session w/ heavy min A but does improve w/ cueing for sequencing and hand/foot placement.  Pt amb x 20' w/ mod to min A.  Pt performed Nu-step at Level 4 x 8' w/ LES only w/o c/o.  Average steps 23 and total 186 steps.  Pt performed squat pivot to w/c to R w/ mod A.  Pt performed multiple sit to stand transfers from mat tablew/ min A, partial squats 3 x 5.  Pt performed standing w/o UE support, min A and verbal cues for R knee buckling.  Pt c/o pain to R knee, ace wrap applied and states "no pain."  Pt performed standing reaching forward to challenge balance.  Pt states R knee is fatigued.  Pt performed seated hooking and unhooking horseshoes from Bball hoop, crossing midline and B UES.  Pt returned to room and remained sitting in w/c w/ chair alarm on and all needs in reach, spouse present.     Therapy Documentation Precautions:  Precautions Precautions: Fall Precaution Comments: R sided reduced coordination, R sided weakness and LLE weakness/reduced motor planning during ambulation Restrictions Weight Bearing Restrictions: No General:    Vital Signs:  Pain:0/10 unless WB to R knee, unable to quantify. Pain Assessment Pain Scale: 0-10 Pain Score: 0-No pain    Therapy/Group: Individual Therapy  Lucio Edward 08/16/2022, 3:39 PM

## 2022-08-16 NOTE — Progress Notes (Signed)
Occupational Therapy Session Note  Patient Details  Name: Anthony Woods MRN: [id number] Date of Birth: [birth year]  Today's Date: 08/16/2022 OT Individual Time: 0803-0920 OT Individual Time Calculation (min): 77 min    Short Term Goals: Week 1:  OT Short Term Goal 1 (Week 1): Pt will demonstrate improved R side coordination and awareness to don a shirt with set up. OT Short Term Goal 2 (Week 1): Pt will demonstrate improved R side coordination to eat independently without set up A. OT Short Term Goal 3 (Week 1): Pt will demonstrate improved static stand balance to stand at toilet with min A while he adjusts clothing over hips. OT Short Term Goal 4 (Week 1): Pt will demonstrate improved motor planning to be able to get to side of bed from supine with only min cues.  Skilled Therapeutic Interventions/Progress Updates:    Patient agreeable to participate in OT session. Reports 0/10 pain level. Breakfast tray next to bed untouched. Pt declined to eat breakfast prior to starting therapy.   Patient participated in skilled OT session focusing on ADL re-training, functional transfers. Pt agreed to take a shower this AM. With increased time, pt was able to transition from supine to sitting on EOB with SBA. Pt donned socks with forward chaining approach. Pt completed stand pivot transfer from bed to w/c with Min A. VC for safety and technique as well as sequencing.  With assist to hold each ankle crossed over opposite knee, pt was able to doff non-skid socks. Grab bar was used in shower with Min A and VC again for sequencing and technique in order to perform a lateral side step into shower prior to sitting on tub bench. Pt completed UB bathing seated with SBA. LB bathing completed with Min A with assist provided to wash bilateral feet and for balance during standing portion. Pt utilized grab bar to stand and lateral step towards left side to pivot and sit in locked w/c. VC provided for sequencing and  technique. Dressing performed seated at sink. UB dressing completed with Set-up. LB dressing completed with Min A. Physical assist required to stabile each ankle over opposite leg in order to begin sock management. Pt held onto sink with at least one extremity while standing and pull pants over hips requiring min assist for thoroughness. Max A required to donn shoes. Grooming performed seated with set-up.   RUE and LUE AA/ROM, strengthening and GMC with pt maintaining grasp of rolled washcloth (independent with placement) high reps hitting rolled ankle weights (3lb, 4lb, and 5lb used) for increased intensity. Placement of targets adjusted to facilitate elbow extension, IR/er, and horizontal abduction/adduction. Improved A/ROM and motor coordination/control noted during task.   Therapy Documentation Precautions:  Precautions Precautions: Fall Precaution Comments: R sided reduced coordination, R sided weakness and LLE weakness/reduced motor planning during ambulation Restrictions Weight Bearing Restrictions: No  Therapy/Group: Individual Therapy  Limmie Patricia, OTR/L,CBIS  Supplemental OT - [facility] and [facility] Secure Chat Preferred   08/16/2022, 7:56 AM

## 2022-08-16 NOTE — Progress Notes (Signed)
Speech Language Pathology Daily Session Note  Patient Details  Name: Anthony Woods MRN: [id number] Date of Birth: 1956/02/27  Today's Date: 08/16/2022 SLP Individual Time: 1000-1100 SLP Individual Time Calculation (min): 60 min  Short Term Goals: Week 1: SLP Short Term Goal 1 (Week 1): Patient will consume current diet with supervision level verbal cues for use of swallowing compensatory strategies. SLP Short Term Goal 2 (Week 1): Patient will utilize speech intelligibility strategies at the sentence level to achieve 90% intelligibility with Min verbal cues. SLP Short Term Goal 3 (Week 1): Patient will demonstrate selective attention for 30 mminutes to functional tasks in a mildly distracting enviornment with Min verbal cues for redirection. SLP Short Term Goal 4 (Week 1): Patient will demonstrate functional problem solving for mildly complex tasks with Min verbal cues for redirection. SLP Short Term Goal 5 (Week 1): Patient will recall new, daily information with Mod A multimodal cues for use of compensatory strategies.  Skilled Therapeutic Interventions:  Pt was seen in am to address cognitive re- training and speech intelligibility. Pt was alert and seated upright in WC upon SLP arrival. He was able to express recent medical hx however, he also endorsed decreased recall to events leading up to CVA. Pt further expresses changes in cognition and speech. SLP provided external visual aid of " A BOSS" speech intelligibility strategies. SLP explained that focus should be on use of overarticulation and separating words. Pt verbalized understanding. SLP challenged pt in functional communication. Pt warranting min to mod cues for use of over articulation and separating speech with ~75% intelligibility. Pt indep oriented to temporal and spatial concepts. SLP challenged pt in recall of functional information including, therapy disciplines, MD's name,  etc. Given a brief 3-5 minute delay, pt recalled  information with 81% acc with mod A. In additional minutes of session, SLP address problem solving within current environment. Pt identified solutions to problems presented verbally with 70% acc. In missed opportunities, SLP provided correct response and rationale. Pt as left seated upright in WC with call button within reach and chair alarm active. SLP to continue POC.  Pain Pain Assessment Pain Scale: 0-10 Pain Score: 0-No pain  Therapy/Group: Individual Therapy  Renaee Munda 08/16/2022, 12:56 PM

## 2022-08-16 NOTE — Progress Notes (Signed)
Patient complaining of headache. No PRN's to give patient at this time.   Anthony Woods [place]

## 2022-08-16 NOTE — Progress Notes (Signed)
Inpatient Rehabilitation Center Individual Statement of Services  Patient Name:  MAI NICOLICH  Date:  08/16/2022  Welcome to the Inpatient Rehabilitation Center.  Our goal is to provide you with an individualized program based on your diagnosis and situation, designed to meet your specific needs.  With this comprehensive rehabilitation program, you will be expected to participate in at least 3 hours of rehabilitation therapies Monday-Friday, with modified therapy programming on the weekends.  Your rehabilitation program will include the following services:  Physical Therapy (PT), Occupational Therapy (OT), Speech Therapy (ST), 24 hour per day rehabilitation nursing, Therapeutic Recreaction (TR), Neuropsychology, Care Coordinator, Rehabilitation Medicine, Nutrition Services, and Pharmacy Services  Weekly team conferences will be held on Wednesday to discuss your progress.  Your Inpatient Rehabilitation Care Coordinator will talk with you frequently to get your input and to update you on team discussions.  Team conferences with you and your family in attendance may also be held.  Expected length of stay: 14-16 days  Overall anticipated outcome: supervision-CGA level  Depending on your progress and recovery, your program may change. Your Inpatient Rehabilitation Care Coordinator will coordinate services and will keep you informed of any changes. Your Inpatient Rehabilitation Care Coordinator's name and contact numbers are listed  below.  The following services may also be recommended but are not provided by the Inpatient Rehabilitation Center:  Driving Evaluations Home Health Rehabiltiation Services Outpatient Rehabilitation Services    Arrangements will be made to provide these services after discharge if needed.  Arrangements include referral to agencies that provide these services.  Your insurance has been verified to be:  Medicare A & B Your primary doctor is:  Macarthur Critchley  [place]  Pertinent information will be shared with your doctor and your insurance company.  Inpatient Rehabilitation Care Coordinator:  Dossie Der, [place] [phone] or [phone]  Information discussed with and copy given to patient by: Lucy Chris, 08/16/2022, 8:30 AM

## 2022-08-17 DIAGNOSIS — I639 Cerebral infarction, unspecified: Secondary | ICD-10-CM | POA: Diagnosis not present

## 2022-08-17 LAB — GLUCOSE, CAPILLARY
Glucose-Capillary: 355 mg/dL — ABNORMAL HIGH (ref 70–99)
Glucose-Capillary: 178 mg/dL — ABNORMAL HIGH (ref 70–99)
Glucose-Capillary: 218 mg/dL — ABNORMAL HIGH (ref 70–99)
Glucose-Capillary: 167 mg/dL — ABNORMAL HIGH (ref 70–99)
Glucose-Capillary: 168 mg/dL — ABNORMAL HIGH (ref 70–99)

## 2022-08-17 MED ORDER — ATENOLOL 25 MG PO TABS
12.5000 mg | ORAL_TABLET | Freq: Every day | ORAL | Status: DC
  Administered 2022-08-18: 12.5 mg via ORAL
  Filled 2022-08-17: qty 1

## 2022-08-17 NOTE — Transitions of Care (Post Inpatient/ED Visit) (Signed)
   08/17/2022  Name: Anthony Woods MRN: [id number] DOB: [birth year] Patient is still in rehab Today's TOC FU Call Status: Today's TOC FU Call Status:: Unsuccessful Call (3rd Attempt) Unsuccessful Call (1st Attempt) Date: 08/11/22 Unsuccessful Call (2nd Attempt) Date: 08/12/22 Unsuccessful Call (3rd Attempt) Date: 08/17/22  Attempted to reach the patient regarding the most recent Inpatient/ED visit.  Follow Up Plan: No further outreach attempts will be made at this time. We have been unable to contact the patient.  Signature Karena Addison, LPN [facility] Nurse Health Advisor Direct Dial [phone]

## 2022-08-17 NOTE — Progress Notes (Signed)
Occupational Therapy Session Note  Patient Details  Name: Anthony Woods MRN: [id number] Date of Birth: [birth year]  Today's Date: 08/17/2022 OT Individual Time: 2130-8657 OT Individual Time Calculation (min): 60 min     Short Term Goals: Week 1:  OT Short Term Goal 1 (Week 1): Pt will demonstrate improved R side coordination and awareness to don a shirt with set up. OT Short Term Goal 2 (Week 1): Pt will demonstrate improved R side coordination to eat independently without set up A. OT Short Term Goal 3 (Week 1): Pt will demonstrate improved static stand balance to stand at toilet with min A while he adjusts clothing over hips. OT Short Term Goal 4 (Week 1): Pt will demonstrate improved motor planning to be able to get to side of bed from supine with only min cues.  Skilled Therapeutic Interventions/Progress Updates:    Patient seated in w/c upon OT arrival, agreeable to participate in OT session. Reports no pain level this date.   Pt donned slip on tennis shoes close supervision. Pt propelled self with BUE from room to day room with SBA with min VC  for direction on turning W/C. Pt able to maneuver 3/4 obstacles with clearance.   OT applied kinesiotape to rt knee to provide stability during functional mobility. OT provided education on skin integrity and to notify nurse of any skin irritation. Handout provided to pt to reference for any questions.  Pt completed sit<>stand CGA @ RW with ~20 ft functional mobility CGA @ RW with two 180 degree turns. Pt required mod VC for walker safety and maintaining wide BOS. Pt required increased time to complete functional mobility. No LOB/SOB during mobility.  Pt completed UE functional coordination/motor control exercise seated in W/C in front of mirror with 2# wrist weights. Pt placed 15 suction cups on mirror with rt hand and removed with lt hand with 0 errors. Pt able to reach to floor while seated to retrieve dropped suction cup.  Pt propelled W/C  from therapy day room>pt room with BUE @ SBA with increased time to complete task. Pt expressed arm fatigue after propulsion d/t increased effort.   Patient participated in skilled OT session focusing on UE strength/endurance and functional coordination tasks. Pt seated in W/C with W/C alarm, 4P's assessed with call light in reach. Pt requested footrests move outwards for comfort.  Therapy Documentation Precautions:  Precautions Precautions: Fall Precaution Comments: R sided reduced coordination, R sided weakness and LLE weakness/reduced motor planning during ambulation Restrictions Weight Bearing Restrictions: No   Therapy/Group: Individual Therapy  Velia Meyer, OTD, OTR/L Limmie Patricia, OTR/L,CBIS  Supplemental OT - [facility] and [facility] Secure Chat Preferred   08/17/2022, 4:04 PM

## 2022-08-17 NOTE — Progress Notes (Signed)
Inpatient Rehabilitation  Patient information reviewed and entered into eRehab system by Adarryl Goldammer M. Jasmain Ahlberg, M.A., CCC/SLP, PPS Coordinator.  Information including medical coding, functional ability and quality indicators will be reviewed and updated through discharge.    

## 2022-08-17 NOTE — Progress Notes (Signed)
Physical Therapy Session Note  Patient Details  Name: Anthony Woods MRN: [id number] Date of Birth: 04/15/1955  Today's Date: 08/17/2022 PT Individual Time: 0905-0950 PT Individual Time Calculation (min): 45 min   Short Term Goals: Week 1:  PT Short Term Goal 1 (Week 1): Pt will perform all bed mobility from flat bed surface with consistent SUP. PT Short Term Goal 2 (Week 1): Pt will perform squat pivot transfer with overall MinA and mod cues for technique. PT Short Term Goal 3 (Week 1): pt will perform stand pivot transfers using LRAD with MinA. PT Short Term Goal 4 (Week 1): Pt will initiate stair training. PT Short Term Goal 5 (Week 1): Pt will ambulate at least 50 ft using LRAD and overall MinA.  Skilled Therapeutic Interventions/Progress Updates: Patient in [facility] on entrance to room. Patient alert and agreeable to PT session.   Patient reported no pain at beginning of session except for R knee but that it was tolerable (unrated). Patient reported 4/10 pain in R knee at the end of PT session.  Therapeutic Activity: Transfers: Pt performed sit<>stand transfer to from WC<>RW with minA for safety per previous reports of R knee buckling. Patient required VC for anterior scoot and to use L hand to push off WC, and the other on RW per presentation of attempting to use B UE on RW to pull self up. Patient then ambulated several feet to hi/low mat with increased effort and time to coordinate and advance R LE. Patient then required modA to sit from RW to hi/low mat with increased time and effort due to reports of R knee pain (unrated).  Sit to stands performed x 4 with no AD, and with modA-maxA for truncal elevation (patient could initiate stand without help, but required increased assistance a quarter of the way up). And VC provided to push with L UE, forward lean, and to scoot anteriorly.  - Patient performed sit from edge of mat to supine on hi/low mat with VC to use L UE to assist in controlling  descent to L sidelying (patient required heavy modA to elevate B LE onto mat 2/2 increased pain in R knee attempting to lift without assistance. Patient then performed supine to R sidelying - R sidelying to supine, and then to L sidelying back to supine on hi/low mat. Patient performed bed mobility on flat surface with supervision for safety. Patient then performed supine to L sidelying to sit on edge of mat with min-modA to advance B LE  NMR performed for improvements in motor control and coordination, balance, sequencing, judgement, and self confidence/ efficacy in performing all aspects of mobility at highest level of independence.   Patient left in [facility] at end of session with brakes locked, belt alarm set, and all needs within reach.  Postural Assessment Scale for Stroke Patients (PASS)  Give the subject instructions for each item as written below. When scoring the item, record the lowest response category that applies for each item.  Maintaining a Posture  2 1. Sitting Without Support Instructions: Have the subject sit on a bench/mat without back support and with feet flat on the floor. (3) Can sit for 5 minutes without support (2) Can sit for more than 10 seconds without support (1) Can sit with slight support (for example, by 1 hand) (0) Cannot sit  3 2. Standing With Support Instructions: Have the subject stand, providing support as needed. Evaluate only the ability to stand with or without support. Do not consider  the quality of the stance. (3) Can stand with support of only 1 hand (2) Can stand with moderate support of 1 person (1) Can stand with strong support of 2 people (0) Cannot stand, even with support  2 3. Standing Without Support Instructions: Have the subject stand without support. Evaluate only the ability to stand with or without support. Do not consider the quality of the stance. (3) Can stand without support for more than 1 minute and simultaneously perform arm  movements at about shoulder level (2) Can stand without support for 1 minute or stands slightly asymmetrically  (1) Can stand without support for 10 seconds or leans heavily on 1 leg (0) Cannot stand without support  0 4. Standing on Nonparetic Leg Instructions: Have the subject stand on the nonparetic leg. Evaluate only the ability to bear weight entirely on the nonparetic leg. Do not consider how the subject accomplishes the task. (3) Can stand on nonparetic leg for more than 10 seconds (2) Can stand on nonparetic leg for more than 5 seconds (1) Can stand on nonparetic leg for a few seconds (0) Cannot stand on nonparetic leg  0 5. Standing on Paretic Leg Instructions: Have the subject stand on the paretic leg. Evaluate only the ability to bear weight entirely on the paretic leg. Do not consider how the subject accomplishes the task. (3) Can stand on paretic leg for more than 10 seconds (2) Can stand on paretic leg for more than 5 seconds (1) Can stand on paretic leg for a few seconds (0) Cannot stand on paretic leg  Maintaining Posture SUBTOTAL 7  Changing a Posture  3 6. Supine to Paretic Side Lateral Instructions: Begin with the subject in supine on a treatment mat. Instruct the subject to roll to the paretic side (lateral movement). Assist as necessary. Evaluate the subject's performance on the amount of help required. Do not consider the quality of performance. (3) Can perform without help (2) Can perform with little help (1) Can perform with much help (0) Cannot perform  3 7. Supine to Nonparetic Side Lateral Instructions: Begin with the subject in supine on a treatment mat. Instruct the subject to roll to the nonparetic side (lateral movement). Assist as necessary. Evaluate the subject's performance on the amount of help required. Do not consider the quality of performance. (3) Can perform without help (2) Can perform with little help (1) Can perform with much help (0)  Cannot perform  1 8. Supine to Sitting Up on the Edge of the Mat Instructions: Begin with the subject in supine on a treatment mat. Instruct the subject to come to sitting on the edge of the mat. Assist as necessary. Evaluate the subject's performance on the amount of help required. Do not consider the quality of performance. (3) Can perform without help (2) Can perform with little help (1) Can perform with much help (0) Cannot perform  1 9. Sitting on the Edge of the Mat to Supine Instructions: Begin with the on the edge of a treatment mat. Instruct the subject to return to supine. Assist as necessary. Evaluate the subject's performance on the amount of help required. Do not consider the quality of performance. (3) Can perform without help (2) Can perform with little help (1) Can perform with much help (0) Cannot perform  1 10. Sitting to Standing Up Instructions: Begin with the subject sitting on the edge of a treatment mat. Instruct the subject to stand up without support. Assist if necessary. Evaluate  the subject's performance on the amount of help required. Do not consider the quality of performance. (3) Can perform without help (2) Can perform with little help (1) Can perform with much help (0) Cannot perform  1 11. Standing Up to Sitting Down Instructions: Begin with the subject standing by edge of a treatment mat. Instruct the subject to sit on edge of mat without support. Assist if necessary. Evaluate the subject's performance on the amount of help required. Do not consider the quality of performance. (3) Can perform without help (2) Can perform with little help (1) Can perform with much help (0) Cannot perform  0 12. Standing, Picking Up a Pencil from the Floor Instructions: Begin with the subject standing. Instruct the subject to pick up a pencil fro the floor without support. Assist if necessary. Evaluate the subject's performance on the amount of help required. Do not  consider the quality of performance. (3) Can perform without help (2) Can perform with little help (1) Can perform with much help (0) Cannot perform  Changing Posture SUBTOTAL 10   TOTAL 17       Therapy Documentation Precautions:  Precautions Precautions: Fall Precaution Comments: R sided reduced coordination, R sided weakness and LLE weakness/reduced motor planning during ambulation Restrictions Weight Bearing Restrictions: No  Therapy/Group: Individual Therapy  Tylah Mancillas PTA 08/17/2022, 3:06 PM

## 2022-08-17 NOTE — Progress Notes (Signed)
Physical Therapy Session Note  Patient Details  Name: Anthony Woods MRN: [id number] Date of Birth: 01-Dec-1955  Today's Date: 08/17/2022 PT Individual Time: 1015-1043 PT Individual Time Calculation (min): 28 min   Short Term Goals: Week 1:  PT Short Term Goal 1 (Week 1): Pt will perform all bed mobility from flat bed surface with consistent SUP. PT Short Term Goal 2 (Week 1): Pt will perform squat pivot transfer with overall MinA and mod cues for technique. PT Short Term Goal 3 (Week 1): pt will perform stand pivot transfers using LRAD with MinA. PT Short Term Goal 4 (Week 1): Pt will initiate stair training. PT Short Term Goal 5 (Week 1): Pt will ambulate at least 50 ft using LRAD and overall MinA.  Skilled Therapeutic Interventions/Progress Updates:      Pt seated in WC upon arrival. Pt agreeable to therapy. Pt reports R knee pain with standing 7/10, but reports no pain with seated rest breaks.   Pt transported dependent in [facility] for time/energy conservation. Pt performed stand pivot transfer with RW and min A, verbal cues provided for scooting forward with reciprocal technique versus posterior trunk lean, and for UE positioning, one UE on RW and one pushing for arm rest of WC, and sequencing for RW.   Pt performed sit to stand 1x20 with no AD and min A, with therapist stabilizing R LE for buckling and providing verbal cues for B UE use for push off and controlled descent, verbal and tactile cues provided for glute activation and upright posture.   Pt performed stand pivot transfer with RW and min A with episode of R LE mild buckling during pivot, verbal cues provided for safety and sequencing.   Pt overall moves very slowly/cautiously likely due to fear of R LE buckling.   Pt seated in WC at end of session with seatbelt alarm on and needs within reach.   Therapy Documentation Precautions:  Precautions Precautions: Fall Precaution Comments: R sided reduced coordination, R sided  weakness and LLE weakness/reduced motor planning during ambulation Restrictions Weight Bearing Restrictions: No Therapy/Group: Individual Therapy  [facility] [place], [place], DPT  08/17/2022, 4:13 PM

## 2022-08-17 NOTE — Progress Notes (Signed)
Standley Brooking, RN Rehab Admission Coordinator Physical Medicine and Rehabilitation   Progress Notes    Addendum   Date of Service: 08/13/2022  1:21 PM    Show:Clear all [x] Written[] Templated[x] Copied  Added by: [x] Talesha Ellithorpe, Tye Maryland, RN  [] Hover for details     Angelina Sheriff, DO  Physician Physical Medicine and Rehabilitation   PMR Pre-admission    Signed   Date of Service: 08/12/2022  4:51 PM  Related encounter: ED to Hosp-Admission (Discharged) from 08/11/2022 in [facility]    Signed       Show:Clear all [x] Written[x] Templated[x] Copied   Added by: [x] Standley Brooking, RN[x] Angelina Sheriff, DO   [] Hover for details PMR Admission Coordinator Pre-Admission Assessment   Patient: Anthony Woods is an 67 y.o., male MRN: [id number] DOB: 12-Feb-1956 Height: 5\' 5"  (165.1 cm) Weight: 95 kg   Insurance Information HMO:     PPO:      PCP:      IPA:      80/20:     OTHER:  PRIMARY: Medicare a and b      Policy#: [id number]      Subscriber: pt Benefits:passport one source online     Name: 5/23 Eff. Date: a 08/20/21 and b 03/22/22     Deduct: $1632      Out of Pocket Max: none      Life Max: none CIR: 100%      SNF: 20 full days Outpatient: 80%     Co-Pay: 20% Home Health: 100%      Co-Pay: none DME: 80%     Co-Pay: 20% Providers: pt choice  SECONDARY: none   Financial Counselor:       Phone#:    The Engineer, materials Information Summary" for patients in Inpatient Rehabilitation Facilities with attached "Privacy Act Statement-Health Care Records" was provided and verbally reviewed with: Patient and Family   Emergency Contact Information Contact Information       Name Relation Home Work Mobile    Frasco,Debra Spouse   [phone]    Laneta Simmers     [phone]    Quintin,Carol Sister     [phone]         Current Medical History  Patient Admitting Diagnosis: CVA   History of Present Illness: 67 year  old male with history of CAD s/p stent, HTN, HLD, Afib on Xarelto, DM, and hypothyroidism who presented to [name] ED on 5/21 with a fall, and complaints of dizziness. Imaging showed a possible medical epicondyle fracture bu CT negative. Wife requested to go home. He returned to ED on 5/22 with increased confusion and falls as well as urinary symptoms. Symptoms resolved but his wife felt too weak to go home. Right sided weakness and slurred speech over 3 weeks reportedly.Not taking his meds correctly over past few months.   CT Angio Head and Neck-  Focal occlusion versus severe/critical stenosis of the left vertebral artery origin with more distal opacification. Moderate right vertebral artery origin stenosis. Approximately 40% stenosis of the right ICA origin in the neck. Aortic Atherosclerosis.  MRI examination of the brain- Acute infarcts within the left aspect of the pons, bilateral thalami and bilateral parietal lobes. The largest infarct is located within the left aspect of the pons, measuring 16 x 8 mm. Additionally, there is a 3 mm acute/early subacute infarct within the mid-to-posterior left frontal lobe white matter. Involvement of multiple vascular territories, suspicious for an embolic process. Outside window for  permissive HTN. Neurology consulted and patient admitted 5/23.   Felt embolic due to afib and noncompliant with Xarelto. LDL 128. Continue on Crestor and Zetia. Echo pending. Prior to admit patient on atenolol, HCTZ, amlodipine and losartan as well as Imdur. Permissive HTN being allowed. Left foot pain with concern for nondisplaced fracture involving the proximal phalanx of the fifth digit. Postop shoe and 4th and 5th toes taped together. Metformin on hold. Hgb A1c pending. Levothyroxine resumed. TSH 29.6.   Complete NIHSS TOTAL: 4   Patient's medical record from [facility] has been reviewed by the rehabilitation admission coordinator and physician.   Past Medical History          Past Medical History:  Diagnosis Date   Allergy to bee sting     Anxiety     Arthritis      "right knee" (05/12/2016)   CAD (coronary artery disease)      a. DES to LCx 04/2016. b. DES to prox Cx 08/2017. c. Stable cath 09/2018 with mild nonobstructive residual disease, normal EF.   Headache      "when his sugar goes too high or too low" (05/12/2016)   HTN (hypertension)     Hyperlipidemia     Hypothyroidism     Macular degeneration     Obesity     PAF (paroxysmal atrial fibrillation) (HCC)     Type II diabetes mellitus (HCC)      Has the patient had major surgery during 100 days prior to admission? No   Family History   family history includes Breast cancer in his sister; CAD in his maternal grandmother; CVA in his father and paternal uncle; Cancer in his father and mother; Colon cancer (age of onset: 22) in his mother; Diabetes in his father, mother, and sister; Stomach cancer in his father.   Current Medications   Current Facility-Administered Medications:    acetaminophen (TYLENOL) tablet 650 mg, 650 mg, Oral, Q4H PRN **OR** acetaminophen (TYLENOL) 160 MG/5ML solution 650 mg, 650 mg, Per Tube, Q4H PRN **OR** acetaminophen (TYLENOL) suppository 650 mg, 650 mg, Rectal, Q4H PRN, Jonah Blue, MD   aspirin EC tablet 81 mg, 81 mg, Oral, Daily, Jonah Blue, MD, 81 mg at 08/13/22 0840   [START ON 08/14/2022] atenolol (TENORMIN) tablet 50 mg, 50 mg, Oral, Daily, Osvaldo Shipper, MD   escitalopram (LEXAPRO) tablet 10 mg, 10 mg, Oral, Daily, Jonah Blue, MD, 10 mg at 08/13/22 1610   ezetimibe (ZETIA) tablet 10 mg, 10 mg, Oral, Daily, Jonah Blue, MD, 10 mg at 08/13/22 0839   insulin aspart (novoLOG) injection 0-15 Units, 0-15 Units, Subcutaneous, TID WC & HS, Carollee Herter, DO, 3 Units at 08/13/22 9604   levothyroxine (SYNTHROID) tablet 175 mcg, 175 mcg, Oral, Q0600, Jonah Blue, MD, 175 mcg at 08/13/22 5409   nicotine (NICODERM CQ - dosed in mg/24 hours) patch 21 mg, 21 mg,  Transdermal, Daily, Jonah Blue, MD, 21 mg at 08/13/22 0841   rivaroxaban (XARELTO) tablet 20 mg, 20 mg, Oral, Daily, Jonah Blue, MD, 20 mg at 08/13/22 0839   rosuvastatin (CRESTOR) tablet 20 mg, 20 mg, Oral, Daily, Jonah Blue, MD, 20 mg at 08/13/22 8119   senna-docusate (Senokot-S) tablet 1 tablet, 1 tablet, Oral, QHS PRN, Jonah Blue, MD   Patients Current Diet:  Diet Order                        DIET DYS 3 Fluid consistency: Thin  Diet effective  now                     MBS completed 5/24   Precautions / Restrictions Precautions Precautions: Fall Precaution Comments: pt with recent increase in falls in last 3 weeks, no longer able to get up Restrictions Weight Bearing Restrictions: No    Has the patient had 2 or more falls or a fall with injury in the past year? 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 Independent until 3 weeks ago   Prior Functional Level Self Care: Did the patient need help bathing, dressing, using the toilet or eating? Independent   Indoor Mobility: Did the patient need assistance with walking from room to room (with or without device)? Independent   Stairs: Did the patient need assistance with internal or external stairs (with or without device)? Independent   Functional Cognition: Did the patient need help planning regular tasks such as shopping or remembering to take medications? Independent   Patient Information Are you of Hispanic, Latino/a,or Spanish origin?: A. No, not of Hispanic, Latino/a, or Spanish origin What is your race?: A. White Do you need or want an interpreter to communicate with a doctor or health care staff?: 0. No   Patient's Response To:  Health Literacy and Transportation Is the patient able to respond to health literacy and transportation needs?: Yes How often do you need to have someone help you when you read instructions, pamphlets or other written  materials from  your doctor or  pharmacy? never In the past 12 months, has lack of transportation kept you from medical appointments or from getting medications?: No In the past 12 months, has lack of transportation kept you from meetings, work, or from getting things needed for daily living?: No   Home Assistive Devices / Equipment Home Equipment: Agricultural consultant (2 wheels), Rollator (4 wheels), [employer] - single point, Information systems manager   Prior Device Use: Indicate devices/aids used by the patient prior to current illness, exacerbation or injury? None of the above   Current Functional Level Cognition   Overall Cognitive Status: Impaired/Different from baseline Current Attention Level: Sustained Orientation Level: Oriented to person, Disoriented to place, Disoriented to time, Disoriented to situation Following Commands: Follows one step commands with increased time, Follows multi-step commands with increased time Safety/Judgement: Decreased awareness of deficits General Comments: pt needing direct cueing for all    Extremity Assessment (includes Sensation/Coordination)   Upper Extremity Assessment: RUE deficits/detail RUE Deficits / Details: Strength 4/5 throughout.  Slight ataxia noted with movements and functional use, dropping of sock when attempting to donn with slower opposition to thumb will all digits noted. RUE Sensation: decreased light touch (decreased light touch noted in the hand) RUE Coordination: decreased fine motor, decreased gross motor LUE Deficits / Details: strength WFLs. slightly slower finger to nose testing LUE Coordination: decreased fine motor  Lower Extremity Assessment: Defer to PT evaluation RLE Deficits / Details: good strength to MMT, poor coordination and motor planning RLE Coordination: decreased fine motor, decreased gross motor LLE Deficits / Details: good strength to MMT, poor coordination and motor planning LLE Coordination: decreased fine motor, decreased  gross motor     ADLs   Overall ADL's : Needs assistance/impaired Eating/Feeding: Set up, Sitting Eating/Feeding Details (indicate cue type and reason): simulated Grooming: Sitting, Wash/dry hands, Wash/dry face Grooming Details (indicate cue type and reason): simulated Upper Body Bathing: Set up, Sitting Upper Body Bathing Details (indicate cue type  and reason): simulated Lower Body Bathing: Maximal assistance, Sit to/from stand Lower Body Bathing Details (indicate cue type and reason): simulated Lower Body Dressing: Maximal assistance, Sit to/from stand Toilet Transfer: Maximal assistance, Ambulation, Rolling walker (2 wheels) Toilet Transfer Details (indicate cue type and reason): simulated Toileting- Clothing Manipulation and Hygiene: Maximal assistance, Sit to/from stand Toileting - Clothing Manipulation Details (indicate cue type and reason): simulated Functional mobility during ADLs: Maximal assistance, Rolling walker (2 wheels) (ambulate forward and backwards a few steps) General ADL Comments: Pt with increased ataxia noted in the RUE with functional use.  LOB to the right in static sitting without UE support as well as posteriorly when attempting to donn his gripper socks.  Decreased ability to advance his LEs efficiently and weightshift to the right for stepping forward with the LUE.     Mobility   Overal bed mobility: Needs Assistance Bed Mobility: Supine to Sit, Sit to Supine Supine to sit: Min assist Sit to supine: Min assist General bed mobility comments: Min assist with increased time and use of the bed rails to sit up.  Min assist for scooting out to the edge of the bed.  Assist needed to lift LEs back in the bed.     Transfers   Overall transfer level: Needs assistance Equipment used: Rolling walker (2 wheels) Transfers: Sit to/from Stand, Bed to chair/wheelchair/BSC Sit to Stand: Mod assist Bed to/from chair/wheelchair/BSC transfer type:: Step pivot Step pivot  transfers: Max assist General transfer comment: Decreased efficiency with advancing the LLE with stepping.  Ataxia noted as well     Ambulation / Gait / Stairs / Wheelchair Mobility   Ambulation/Gait General Gait Details: pt unable to steady for more than single step laterally along EOB. poor motor control to advance RLE Gait velocity: decreased     Posture / Balance Dynamic Sitting Balance Sitting balance - Comments: posterior and right LOB in sitting Balance Overall balance assessment: Needs assistance Sitting-balance support: No upper extremity supported, Feet supported Sitting balance-Leahy Scale: Poor Sitting balance - Comments: posterior and right LOB in sitting Postural control: Posterior lean Standing balance support: Bilateral upper extremity supported, During functional activity Standing balance-Leahy Scale: Zero Standing balance comment: max assist for standing balance with need for assistive device and therapist assist for mobility     Special needs/care consideration Fall precautions Noncompliant with meds prior to admit Hgb A1c pending Smoker DNR on acute hospital    Previous Home Environment  Living Arrangements: Spouse/significant other  Lives With: Spouse Available Help at Discharge: Available 24 hours/day Type of Home: Mobile home Home Layout: One level Home Access: Stairs to enter Entrance Stairs-Rails: Right, Left Entrance Stairs-Number of Steps: 4 Bathroom Shower/Tub: Health visitor: Standard Bathroom Accessibility: Yes How Accessible: Accessible via walker Home Care Services: No   Discharge Living Setting Plans for Discharge Living Setting: Patient's home, Lives with (comment), Mobile Home (wife) Type of Home at Discharge: Mobile home Discharge Home Layout: One level Discharge Home Access: Stairs to enter Entrance Stairs-Rails: Right, Left Entrance Stairs-Number of Steps: 4 Discharge Bathroom Shower/Tub: Walk-in shower Discharge  Bathroom Toilet: Standard Discharge Bathroom Accessibility: Yes How Accessible: Accessible via walker Does the patient have any problems obtaining your medications?: No   Social/Family/Support Systems Patient Roles: Spouse Contact Information: wife, Stanton Kidney Anticipated Caregiver: wife Anticipated Caregiver's Contact Information: see contacts Ability/Limitations of Caregiver: none Caregiver Availability: 24/7 Discharge Plan Discussed with Primary Caregiver: Yes Is Caregiver In Agreement with Plan?: Yes Does Caregiver/Family have Issues with Lodging/Transportation  while Pt is in Rehab?: No Goals Patient/Family Goal for Rehab: supervision PT, OT and SLP Expected length of stay: ELOS 10 to 14 days Pt/Family Agrees to Admission and willing to participate: Yes Program Orientation Provided & Reviewed with Pt/Caregiver Including Roles  & Responsibilities: Yes   Decrease burden of Care through IP rehab admission: n/a   Possible need for SNF placement upon discharge: not anticipated   Patient Condition: I have reviewed medical records from [facility], spoken with CM, and patient and spouse. I met with patient at the bedside for inpatient rehabilitation assessment.  Patient will benefit from ongoing PT, OT, and SLP, can actively participate in 3 hours of therapy a day 5 days of the week, and can make measurable gains during the admission.  Patient will also benefit from the coordinated team approach during an Inpatient Acute Rehabilitation admission.  The patient will receive intensive therapy as well as Rehabilitation physician, nursing, social worker, and care management interventions.  Due to bladder management, bowel management, safety, skin/wound care, disease management, medication administration, pain management, and patient education the patient requires 24 hour a day rehabilitation nursing.  The patient is currently mod assist overall with mobility and basic ADLs.  Discharge setting and  therapy post discharge at home with home health is anticipated.  Patient has agreed to participate in the Acute Inpatient Rehabilitation Program and will admit today.   Preadmission Screen Completed By:  Clois Dupes, RN MSN 08/13/2022 10:47 AM ______________________________________________________________________   Discussed status with Dr. Shearon Stalls on 08/13/22 at 1049 and received approval for admission today.   Admission Coordinator:  Clois Dupes, RN MSN time 1047 Date 08/13/22    Assessment/Plan: Diagnosis: Does the need for close, 24 hr/day Medical supervision in concert with the patient's rehab needs make it unreasonable for this patient to be served in a less intensive setting? Yes Co-Morbidities requiring supervision/potential complications: A fib, hypertension, L foot 5th digit fracture, uncontrolled diabetes, hypothyroidism, dysphagia, bowel and bladder incontinence Due to bladder management, bowel management, safety, skin/wound care, disease management, medication administration, pain management, and patient education, does the patient require 24 hr/day rehab nursing? Yes Does the patient require coordinated care of a physician, rehab nurse, PT, OT, and SLP to address physical and functional deficits in the context of the above medical diagnosis(es)? Yes Addressing deficits in the following areas: balance, endurance, locomotion, strength, transferring, bowel/bladder control, bathing, dressing, feeding, grooming, toileting, cognition, speech, language, and swallowing Can the patient actively participate in an intensive therapy program of at least 3 hrs of therapy 5 days a week? Yes The potential for patient to make measurable gains while on inpatient rehab is good Anticipated functional outcomes upon discharge from inpatient rehab: supervision PT, supervision OT, supervision SLP Estimated rehab length of stay to reach the above functional goals is: 10-14  days Anticipated discharge destination: Home 10. Overall Rehab/Functional Prognosis: good     MD Signature:   Angelina Sheriff, DO 08/13/2022            Revision History         Revision History

## 2022-08-17 NOTE — Progress Notes (Signed)
Occupational Therapy Session Note  Patient Details  Name: Anthony Woods MRN: [id number] Date of Birth: [birth year]  Today's Date: 08/17/2022 OT Individual Time: 7829-5621 OT Individual Time Calculation (min): 45 min    Short Term Goals: Week 1:  OT Short Term Goal 1 (Week 1): Pt will demonstrate improved R side coordination and awareness to don a shirt with set up. OT Short Term Goal 2 (Week 1): Pt will demonstrate improved R side coordination to eat independently without set up A. OT Short Term Goal 3 (Week 1): Pt will demonstrate improved static stand balance to stand at toilet with min A while he adjusts clothing over hips. OT Short Term Goal 4 (Week 1): Pt will demonstrate improved motor planning to be able to get to side of bed from supine with only min cues.  Skilled Therapeutic Interventions/Progress Updates:   Patient received seated in bed with breakfast tray in front of him.  Patient reports he does not typically eat breakfast.  Assisted patient to roll to edge of bed in preparation for transfer.  Patient maintains static sitting balance without physical help.  Patient declines shower this am.  Patient asked to explain then show steps of transfer and immediately checked for wheelchair brakes to be locked.  Patient completed stand step transfer with min assist.  Patient with diminished control of right limbs, but also limited by old injuries from falls to BLE.  Worked on standing at sink with reduced need for UE support to allow Ue's for functional task - bathing, pulling up/down clothing.  Patient left up in wheelchair with safety belt in place and engaged and call bell/ personal items in reach.    Therapy Documentation Precautions:  Precautions Precautions: Fall Precaution Comments: R sided reduced coordination, R sided weakness and LLE weakness/reduced motor planning during ambulation Restrictions Weight Bearing Restrictions: No  Pain:  Patient reports pain periodically in left  ankle.  Injury from fall.       Therapy/Group: Individual Therapy  [name] 08/17/2022, 12:07 PM

## 2022-08-17 NOTE — Progress Notes (Signed)
PROGRESS NOTE   Subjective/Complaints: No new complaints this morning When asked, he notes he had difficulty sleeping last night due to feeling hot and cold alternatively   Objective:   No results found. Recent Labs    08/16/22 0542  WBC 6.4  HGB 15.2  HCT 44.0  PLT 181   Recent Labs    08/16/22 0542  NA 135  K 3.4*  CL 102  CO2 21*  GLUCOSE 185*  BUN 20  CREATININE 0.74  CALCIUM 9.3    Intake/Output Summary (Last 24 hours) at 08/17/2022 1208 Last data filed at 08/17/2022 5366 Gross per 24 hour  Intake 460 ml  Output 1200 ml  Net -740 ml        Physical Exam: Vital Signs Blood pressure 106/66, pulse 64, temperature (!) 97.5 F (36.4 C), temperature source Oral, resp. rate 17, height 5\' 6"  (1.676 m), weight 77 kg, SpO2 91 %. Gen: no distress, normal appearing, BMI 27.40 HEENT: oral mucosa pink and moist, NCAT Cardio: Bradycardia Chest: normal effort, normal rate of breathing Abd: soft, non-distended Ext: no edema Psych: pleasant, normal affect Skin: intact Neurologic: Cranial nerves II through XII intact, motor strength is 4/5 in bilateral deltoid, bicep, tricep, grip, 3- bilat hip flexor, knee extensors, ankle dorsiflexor and plantar flexor Sensory exam normal sensation to light touch and proprioception in bilateral upper and lower extremities Cerebellar exam, mild to moderate dysmetria finger to nose to finger, lower extremity not tested secondary to weakness musculoskeletal: Full range of motion in all 4 extremities. No joint swelling   Assessment/Plan: 1. Functional deficits which require 3+ hours per day of interdisciplinary therapy in a comprehensive inpatient rehab setting. Physiatrist is providing close team supervision and 24 hour management of active medical problems listed below. Physiatrist and rehab team continue to assess barriers to discharge/monitor patient progress toward functional and  medical goals  Care Tool:  Bathing    Body parts bathed by patient: Right arm, Left arm, Chest, Abdomen, Front perineal area, Right upper leg, Left upper leg, Face   Body parts bathed by helper: Buttocks, Left lower leg, Right lower leg     Bathing assist Assist Level: Moderate Assistance - Patient 50 - 74%     Upper Body Dressing/Undressing Upper body dressing   What is the patient wearing?: Pull over shirt    Upper body assist Assist Level: Minimal Assistance - Patient > 75%    Lower Body Dressing/Undressing Lower body dressing      What is the patient wearing?: Pants     Lower body assist Assist for lower body dressing: Moderate Assistance - Patient 50 - 74%     Toileting Toileting    Toileting assist Assist for toileting: Moderate Assistance - Patient 50 - 74% Assistive Device Comment: urinal   Transfers Chair/bed transfer  Transfers assist     Chair/bed transfer assist level: Moderate Assistance - Patient 50 - 74%     Locomotion Ambulation   Ambulation assist      Assist level: Moderate Assistance - Patient 50 - 74% Assistive device: Walker-rolling Max distance: 20   Walk 10 feet activity   Assist     Assist  level: Minimal Assistance - Patient > 75% Assistive device: Walker-rolling   Walk 50 feet activity   Assist Walk 50 feet with 2 turns activity did not occur: Safety/medical concerns         Walk 150 feet activity   Assist Walk 150 feet activity did not occur: Safety/medical concerns         Walk 10 feet on uneven surface  activity   Assist Walk 10 feet on uneven surfaces activity did not occur: Safety/medical concerns         Wheelchair     Assist Is the patient using a wheelchair?: Yes Type of Wheelchair: Manual    Wheelchair assist level: Dependent - Patient 0% Max wheelchair distance: 300 ft    Wheelchair 50 feet with 2 turns activity    Assist        Assist Level: Dependent - Patient 0%    Wheelchair 150 feet activity     Assist      Assist Level: Dependent - Patient 0%   Blood pressure 106/66, pulse 64, temperature (!) 97.5 F (36.4 C), temperature source Oral, resp. rate 17, height 5\' 6"  (1.676 m), weight 77 kg, SpO2 91 %.    Medical Problem List and Plan: 1. Functional deficits secondary to left pontine and bilateral thalamic, parietal embolic strokes             -patient may shower             -ELOS/Goals: 10 to 14 days, supervision goals PT/OT/SLP   Grounds pass ordered  2.  Atrial fibrillation: continue Eliquis             -antiplatelet therapy: aspirin    3. Pain Management: Tylenol, Robaxin as needed   4. Mood/Behavior/Sleep: LCSW to evaluate and provide emotional support             -continue Lexapro             -antipsychotic agents: n/a   5. Neuropsych/cognition: This patient is not capable of making decisions on his own behalf.   6. Skin/Wound Care: Routine skin care checks   7. Fluids/Electrolytes/Nutrition/aphasia: Routine Is and Os and follow-up chemistries -On modified diet, SLP eval pending   8: Hypertension: monitor TID and prn; at home on tenoretic             -decrease atenolol to 12.5mg  daily.    9: Hyperlipidemia: continue statin, Zetia   10: CAD s/p DES, 2018, 2019             -continue aspirin and statin, Zetia, BB   11: pAF: continue Eliquis and BB   12: DM-2: at home on metformin 1000 mg BID, Humalog 75/25 40 units, Ozempic             -continue SSI, monitor intake, CBGs QID             -Add carb controlled to dysphagia diet CBG (last 3)  Recent Labs (last 2 labs)       Recent Labs    08/13/22 2129 08/14/22 0631 08/14/22 1206  GLUCAP 288* 246* 252*    Is not on any hypoglycemic agents, will restart metformin 1000 mg twice daily and monitor. Educated regarding elevated CBGs and provided list of foods for diabetes.    13: Tobacco use: cessation counseling; continue Nicoderm   14: Hypothyroidism: continue  Synthroid   15: Overweight: dietary/exercise counseling    16: Left foot pain: possible non-displaced fracture vs. osseous remolding  5th toe             -continue Darco shoe, buddy wrap  17. Bradycardia: decrease atenolol to 25mg   18. Hypokalemia: ordered klor once on 5/27    LOS: 4 days A FACE TO FACE EVALUATION WAS PERFORMED  Anthony Woods P Kymberli Wiegand 08/17/2022, 12:08 PM

## 2022-08-17 NOTE — Progress Notes (Signed)
Physical Therapy Session Note  Patient Details  Name: Anthony Woods MRN: [id number] Date of Birth: [birth year]  Today's Date: 08/17/2022 PT Individual Time: 1119-1210 PT Individual Time Calculation (min): 51 min   Short Term Goals: Week 1:  PT Short Term Goal 1 (Week 1): Pt will perform all bed mobility from flat bed surface with consistent SUP. PT Short Term Goal 2 (Week 1): Pt will perform squat pivot transfer with overall MinA and mod cues for technique. PT Short Term Goal 3 (Week 1): pt will perform stand pivot transfers using LRAD with MinA. PT Short Term Goal 4 (Week 1): Pt will initiate stair training. PT Short Term Goal 5 (Week 1): Pt will ambulate at least 50 ft using LRAD and overall MinA.  Skilled Therapeutic Interventions/Progress Updates:  Patient seated upright in w/c on entrance to room. Patient alert and agreeable to PT session.   Patient with no pain complaint at start of session. But does state that he is fatigued following 3 therapy sessions already this morning. Is having mild pain in medial R knee ~3-4/ 10. Is wondering if he can ask his wife to bring in his new knee brace that he states helps at home. Informed pt to have knee brace brought in and therapy can at least evaluate for fit and potential need for adjustment or recommendation for different brace.   Therapeutic Activity: Transfers: Pt performed sit<>stand transfers throughout session with and stand pivot transfers throughout session with ***. Provided verbal cues for***.  Gait Training:  Pt ambulated *** ft using *** with ***. Demonstrated ***. Provided vc/ tc for ***.  Wheelchair Mobility:  Pt propelled wheelchair *** feet with ***. Provided vc/ tc for ***.  Neuromuscular Re-ed: NMR facilitated during session with focus on motor control of BLE. Pt guided in continuous reciprocation of BLE only using NuStep L3 x then BLE and BUE L3 x with focus on maintaining neutral positioning of Bil hips to  prevent ER.  . NMR performed for improvements in motor control and coordination, balance, sequencing, judgement, and self confidence/ efficacy in performing all aspects of mobility at highest level of independence.   Patient seated upright in w/c at end of session with brakes locked, belt alarm set, and all needs within reach. Tray table in front of pt for lunch tray setup prior to arrival. NT [place] providing lunch tray on exit of room.    Therapy Documentation Precautions:  Precautions Precautions: Fall Precaution Comments: R sided reduced coordination, R sided weakness and LLE weakness/reduced motor planning during ambulation Restrictions Weight Bearing Restrictions: No General:   Vital Signs: Therapy Vitals Pulse Rate: 64 BP: 106/66 Pain:  Low back pain related with standing activities. Improves with repositioning to seated position.   Therapy/Group: Individual Therapy  Loel Dubonnet PT, DPT, CSRS 08/17/2022, 8:52 AM

## 2022-08-18 DIAGNOSIS — I639 Cerebral infarction, unspecified: Secondary | ICD-10-CM | POA: Diagnosis not present

## 2022-08-18 LAB — URINALYSIS, ROUTINE W REFLEX MICROSCOPIC
Bacteria, UA: NONE SEEN
Bilirubin Urine: NEGATIVE
Glucose, UA: NEGATIVE mg/dL
Hgb urine dipstick: NEGATIVE
Ketones, ur: NEGATIVE mg/dL
Leukocytes,Ua: NEGATIVE
Nitrite: NEGATIVE
Protein, ur: 30 mg/dL — AB
Specific Gravity, Urine: 1.008 (ref 1.005–1.030)
pH: 5 (ref 5.0–8.0)

## 2022-08-18 LAB — GLUCOSE, CAPILLARY
Glucose-Capillary: 160 mg/dL — ABNORMAL HIGH (ref 70–99)
Glucose-Capillary: 211 mg/dL — ABNORMAL HIGH (ref 70–99)
Glucose-Capillary: 143 mg/dL — ABNORMAL HIGH (ref 70–99)
Glucose-Capillary: 213 mg/dL — ABNORMAL HIGH (ref 70–99)

## 2022-08-18 MED ORDER — MELATONIN 3 MG PO TABS
3.0000 mg | ORAL_TABLET | Freq: Every day | ORAL | Status: DC
  Administered 2022-08-18 – 2022-09-02 (×16): 3 mg via ORAL
  Filled 2022-08-18 (×16): qty 1

## 2022-08-18 NOTE — Progress Notes (Signed)
Patient ID: Anthony Woods, male   DOB: April 03, 1955, 67 y.o.   MRN: [id number]  Met with pt and contacted wife via telephone to give both team conference update with goals of supervision-CGA level and target discharge of 6/11. Wife has health issues-pulmonary fibrosis and heart issues so needs to be careful. She reports her son is putting a ramp up and should be completed in the next week. Discussed if wife can not provide the assist pt requires he will need to possibly go to a SNF then home once can meet overall supervision level. Will work on discharge needs and await progress hopefully will be enough to return home with wife.

## 2022-08-18 NOTE — Progress Notes (Signed)
Orthopedic Tech Progress Note Patient Details:  Anthony Woods 11-21-1955 [id number]  Patient was not I the room at the time I went to apply knee sleeve, left in room on table  Ortho Devices Type of Ortho Device: Knee Sleeve Ortho Device/Splint Location: RLE Ortho Device/Splint Interventions: Ordered, Other (comment)   Post Interventions Patient Tolerated: Well Instructions Provided: Care of device  Donald Pore 08/18/2022, 1:53 PM

## 2022-08-18 NOTE — Progress Notes (Signed)
Recreational Therapy Session Note  Patient Details  Name: Anthony Woods MRN: [id number] Date of Birth: December 03, 1955 Today's Date: 08/18/2022  Pain:  no c/o Pt participated in animal assisted activity seated EOM with supervision.  Pt easily engaged in conversation with pet partner team and CTRS.    [name] 08/18/2022, 4:39 PM

## 2022-08-18 NOTE — Progress Notes (Signed)
Patient states he is seeing bugs crawling all over the walls and floor in his room. He's not sure if its in his head or if it is really happening.   Gaelle Adriance [place]

## 2022-08-18 NOTE — Progress Notes (Signed)
Occupational Therapy Session Note  Patient Details  Name: Anthony Woods MRN: [id number] Date of Birth: May 02, 1955  Today's Date: 08/18/2022 OT Individual Time: 4098-1191 OT Individual Time Calculation (min): 57 min    Short Term Goals: Week 1:  OT Short Term Goal 1 (Week 1): Pt will demonstrate improved R side coordination and awareness to don a shirt with set up. OT Short Term Goal 2 (Week 1): Pt will demonstrate improved R side coordination to eat independently without set up A. OT Short Term Goal 3 (Week 1): Pt will demonstrate improved static stand balance to stand at toilet with min A while he adjusts clothing over hips. OT Short Term Goal 4 (Week 1): Pt will demonstrate improved motor planning to be able to get to side of bed from supine with only min cues.  Skilled Therapeutic Interventions/Progress Updates:    Patient received supine in bed - nursing providing care.  Patient agreeable to OT session and to shower.  Transported patient in wheelchair to bathroom.  Patient transferred to toilet with min assist, and to shower seat with mod assist and increased time.  Patient able to bathe self seated on tub transfer bench.  Patient with insufficient forward weight shift for sit to stand initially - heavily reliant on grab bars.   Patient needs step by step cueing for foot placement stepping into and out of shower.  Movement ataxic and patient with intermittent complaints of pain in left ankle, right knee, although reports knee tape has been helpful.   Patient able to dress himself while seated with cueing for orientation of clothing and increased time.  Patient with decreased coordination in R hand - but able to apply toothpaste to toothbrush while seated.  Patient left up in wheelchair with safety belt in place and engaged, and call bell/ personal items in reach.    Therapy Documentation Precautions:  Precautions Precautions: Fall Precaution Comments: R sided reduced coordination, R sided  weakness and LLE weakness/reduced motor planning during ambulation Restrictions Weight Bearing Restrictions: No  Pain:  Intermittent pain knees and left ankle  Therapy/Group: Individual Therapy  [name] 08/18/2022, 8:42 AM

## 2022-08-18 NOTE — Patient Care Conference (Signed)
Inpatient RehabilitationTeam Conference and Plan of Care Update Date: 08/18/2022   Time: 11:20 AM    Patient Name: Anthony Woods      Medical Record Number: [id number]  Date of Birth: 10-16-1955 Sex: Male         Room/Bed: 4W03C/4W03C-01 Payor Info: Payor: MEDICARE / Plan: MEDICARE PART A AND B / Product Type: *No Product type* /    Admit Date/Time:  08/13/2022  1:17 PM  Primary Diagnosis:  Acute embolic stroke [facility])  Hospital Problems: Principal Problem:   Acute embolic stroke [facility])    Expected Discharge Date: Expected Discharge Date: 08/31/22  Team Members Present: Physician leading conference: Dr. Sula Soda Social Worker Present: Dossie Der, LCSW Nurse Present: Chana Bode, RN PT Present: Amedeo Plenty, PT OT Present: Bretta Bang, OT SLP Present: Other (comment) Fae Pippin, SLP) PPS Coordinator present : Fae Pippin, SLP     Current Status/Progress Goal Weekly Team Focus  Bowel/Bladder   Continent of B/B (requires assistance using urinal)   remain continent   Assist with toileting as needed    Swallow/Nutrition/ Hydration   dys 3 and thin liquids   Mod I  tolerance of current diet, implementation of swallowing strategies.    ADL's   mod /min assist BADL   Supervision/ set up   Improve static to dynamic stand balance, improve coordination R limbs, decrease assist needed for BADL    Mobility   bed mobility = SUP with max cues or else MinA, transfers = MinA, Ambulation with RW = CGA/ minA for shorter distances d/t R knee pain/ buckling   supervision/ CGA overall  - wife cannot provide physical assist.  Barriers: R knee pain/ buckling and self-limits mobility d/t chronic knee pain; This week: improving coordination, motor planning, NMR, standing balance, ambulation, pain mgmt    Communication   75% speech intelligibility with min to mod A   supervision   implementation of compensatory strategies    Safety/Cognition/ Behavioral Observations   min A   supervision to min A   attention, problem solving, and recall    Pain   no c/o pain at this time   remain pain free   Assess qshift and prn    Skin   Skin intact   Maintain skin integrity  assess qshift and prn      Discharge Planning:  Confirming discharge plan-unsure if wife will be assisting him. Has siblings that can check on him. Pt reports wife will be there-do not have confirmation from wife   Team Discussion: Patient post CVA with hx. Of bad knees and new left ankle pain, ataxia, poor coordination and dizziness.  Patient on target to meet rehab goals: yes, currently needs min assist overall for ADLs. Able to ambulate using a RW with CGA. Needs min assist for cognition, working on attention, problem solving.    *See Care Plan and progress notes for long and short-term goals.   Revisions to Treatment Plan:  [facility] shoe for left foot Knee sleeve/Voltaren gel   Teaching Needs: Safety, medications, dietary modification, transfers, toileting, etc.   Current Barriers to Discharge: Home enviroment access/layout and Lack of/limited family support and lack of funds for medications  Possible Resolutions to Barriers: Family education     Medical Summary Current Status: hypokalemia, bradycardia, active smoker, scab and pain of ankle, dizzy, overweight, right knee buckling  Barriers to Discharge: Medical stability  Barriers to Discharge Comments: hypokalemia, bradycardia, active smoker, scab and pain of ankle, dizzy, overweight, right  knee buckling Possible Resolutions to [employer]: supplemented klor, decreased atenolol, continue nicotine patch, continue darco boot, provided dietary education, right knee sleeve ordered   Continued Need for Acute Rehabilitation Level of Care: The patient requires daily medical management by a physician with specialized training in physical medicine and rehabilitation for the following reasons: Direction of a  multidisciplinary physical rehabilitation program to maximize functional independence : Yes Medical management of patient stability for increased activity during participation in an intensive rehabilitation regime.: Yes Analysis of laboratory values and/or radiology reports with any subsequent need for medication adjustment and/or medical intervention. : Yes   I attest that I was present, lead the team conference, and concur with the assessment and plan of the team.   Chana Bode B 08/18/2022, 3:43 PM

## 2022-08-18 NOTE — Progress Notes (Signed)
PROGRESS NOTE   Subjective/Complaints: No new complaints this morning Therapy notes right knee pain and buckling, knee sleeve ordered Hypokalemic 5/27   Objective:   No results found. Recent Labs    08/16/22 0542  WBC 6.4  HGB 15.2  HCT 44.0  PLT 181   Recent Labs    08/16/22 0542  NA 135  K 3.4*  CL 102  CO2 21*  GLUCOSE 185*  BUN 20  CREATININE 0.74  CALCIUM 9.3    Intake/Output Summary (Last 24 hours) at 08/18/2022 1118 Last data filed at 08/18/2022 0841 Gross per 24 hour  Intake 912 ml  Output 1400 ml  Net -488 ml        Physical Exam: Vital Signs Blood pressure 107/67, pulse 66, temperature 98.3 F (36.8 C), resp. rate 17, height 5\' 6"  (1.676 m), weight 77 kg, SpO2 93 %. Gen: no distress, normal appearing, BMI 27.40 HEENT: oral mucosa pink and moist, NCAT Cardio: HR normalized Chest: normal effort, normal rate of breathing Abd: soft, non-distended Ext: no edema Psych: pleasant, normal affect Skin: intact Neurologic: Cranial nerves II through XII intact, motor strength is 4/5 in bilateral deltoid, bicep, tricep, grip, 3- bilat hip flexor, knee extensors, ankle dorsiflexor and plantar flexor Sensory exam normal sensation to light touch and proprioception in bilateral upper and lower extremities Cerebellar exam, mild to moderate dysmetria finger to nose to finger, lower extremity not tested secondary to weakness musculoskeletal: Full range of motion in all 4 extremities. No joint swelling   Assessment/Plan: 1. Functional deficits which require 3+ hours per day of interdisciplinary therapy in a comprehensive inpatient rehab setting. Physiatrist is providing close team supervision and 24 hour management of active medical problems listed below. Physiatrist and rehab team continue to assess barriers to discharge/monitor patient progress toward functional and medical goals  Care Tool:  Bathing     Body parts bathed by patient: Right arm, Left arm, Chest, Abdomen, Front perineal area, Right upper leg, Left upper leg, Face, Buttocks, Left lower leg, Right lower leg   Body parts bathed by helper: Buttocks, Left lower leg, Right lower leg     Bathing assist Assist Level: Minimal Assistance - Patient > 75%     Upper Body Dressing/Undressing Upper body dressing   What is the patient wearing?: Pull over shirt    Upper body assist Assist Level: Supervision/Verbal cueing    Lower Body Dressing/Undressing Lower body dressing      What is the patient wearing?: Underwear/pull up, Pants     Lower body assist Assist for lower body dressing: Minimal Assistance - Patient > 75%     Toileting Toileting    Toileting assist Assist for toileting: Minimal Assistance - Patient > 75% Assistive Device Comment: urinal   Transfers Chair/bed transfer  Transfers assist     Chair/bed transfer assist level: Minimal Assistance - Patient > 75%     Locomotion Ambulation   Ambulation assist      Assist level: Moderate Assistance - Patient 50 - 74% Assistive device: Walker-rolling Max distance: 20   Walk 10 feet activity   Assist     Assist level: Minimal Assistance - Patient > 75%  Assistive device: Walker-rolling   Walk 50 feet activity   Assist Walk 50 feet with 2 turns activity did not occur: Safety/medical concerns         Walk 150 feet activity   Assist Walk 150 feet activity did not occur: Safety/medical concerns         Walk 10 feet on uneven surface  activity   Assist Walk 10 feet on uneven surfaces activity did not occur: Safety/medical concerns         Wheelchair     Assist Is the patient using a wheelchair?: Yes Type of Wheelchair: Manual    Wheelchair assist level: Dependent - Patient 0% Max wheelchair distance: 300 ft    Wheelchair 50 feet with 2 turns activity    Assist        Assist Level: Dependent - Patient 0%    Wheelchair 150 feet activity     Assist      Assist Level: Dependent - Patient 0%   Blood pressure 107/67, pulse 66, temperature 98.3 F (36.8 C), resp. rate 17, height 5\' 6"  (1.676 m), weight 77 kg, SpO2 93 %.    Medical Problem List and Plan: 1. Functional deficits secondary to left pontine and bilateral thalamic, parietal embolic strokes             -patient may shower             -ELOS/Goals: 10 to 14 days, supervision goals PT/OT/SLP   Grounds pass ordered  2.  Atrial fibrillation: continue Eliquis             -antiplatelet therapy: aspirin    3. Pain Management: Tylenol, Robaxin as needed   4. Mood/Behavior/Sleep: LCSW to evaluate and provide emotional support             -continue Lexapro             -antipsychotic agents: n/a   5. Neuropsych/cognition: This patient is not capable of making decisions on his own behalf.   6. Skin/Wound Care: Routine skin care checks   7. Fluids/Electrolytes/Nutrition/aphasia: Routine Is and Os and follow-up chemistries -On modified diet, SLP eval pending   8: Hypertension: monitor TID and prn; at home on tenoretic             -decrease atenolol to 12.5mg  daily.    9: Hyperlipidemia: continue statin, Zetia   10: CAD s/p DES, 2018, 2019             -continue aspirin and statin, Zetia, BB   11: pAF: continue Eliquis and BB   12: DM-2: at home on metformin 1000 mg BID, Humalog 75/25 40 units, Ozempic             -continue SSI, monitor intake, CBGs QID             -Add carb controlled to dysphagia diet CBG (last 3)  Recent Labs (last 2 labs)       Recent Labs    08/13/22 2129 08/14/22 0631 08/14/22 1206  GLUCAP 288* 246* 252*    Is not on any hypoglycemic agents, will restart metformin 1000 mg twice daily and monitor. Educated regarding elevated CBGs and provided list of foods for diabetes.    13: Tobacco use: cessation counseling; continue Nicoderm   14: Hypothyroidism: continue Synthroid   15: Overweight:  dietary/exercise counseling    16: Left foot pain: possible non-displaced fracture vs. osseous remolding 5th toe             -  continue Darco shoe, buddy wrap  17. Bradycardia: d/c atenolol  18. Hypokalemia: ordered klor once on 5/27  19. Dizziness: d/c atenolol  20. Right knee buckling: knee sleeve ordered    LOS: 5 days A FACE TO FACE EVALUATION WAS PERFORMED  Aramis Weil P Ezri Landers 08/18/2022, 11:18 AM

## 2022-08-18 NOTE — Progress Notes (Signed)
Occupational Therapy Session Note  Patient Details  Name: Anthony Woods MRN: [id number] Date of Birth: 25-Sep-1955  Today's Date: 08/18/2022 OT Individual Time: 1131-1205 OT Individual Time Calculation (min): 34 min    Short Term Goals: Week 1:  OT Short Term Goal 1 (Week 1): Pt will demonstrate improved R side coordination and awareness to don a shirt with set up. OT Short Term Goal 2 (Week 1): Pt will demonstrate improved R side coordination to eat independently without set up A. OT Short Term Goal 3 (Week 1): Pt will demonstrate improved static stand balance to stand at toilet with min A while he adjusts clothing over hips. OT Short Term Goal 4 (Week 1): Pt will demonstrate improved motor planning to be able to get to side of bed from supine with only min cues.  Skilled Therapeutic Interventions/Progress Updates:    Patient received seated in wheelchair staring at floor.  Patient reports seeing bugs on floor.  Patient indicates last night bugs were all over floor and climbing up wall.  No bugs apparent.   Patient states - "maybe its all in my head."   Patient taken to therapy gym.  Nurse informed of potential hallucination.    Worked on Landscape architect and autonomy with level surface transfer.  Patient joking during session, and at times having difficulty following verbal cueing.  Patient able to complete transfer squat pivot or partial stand step with min assist - both directions.  Patient needs facilitation to guide hips toward right - consistently.    Patient returned to room, left up in wheelchair with safety belt in place and call bell/ personal items in reach.    Therapy Documentation Precautions:  Precautions Precautions: Fall Precaution Comments: R sided reduced coordination, R sided weakness and LLE weakness/reduced motor planning during ambulation Restrictions Weight Bearing Restrictions: No   Vital Signs: Therapy Vitals 107/67 BP Seated 66 pulse Pain:  Denies  specific pain, generalized chronic pain     Therapy/Group: Individual Therapy  [name] 08/18/2022, 2:58 PM

## 2022-08-18 NOTE — Progress Notes (Addendum)
Physical Therapy Session Note  Patient Details  Name: Anthony Woods MRN: [id number] Date of Birth: [birth year]  Today's Date: 08/18/2022 PT Individual Time: 1002-1119; 1305 - 1350 PT Individual Time Calculation (min): 77 min; 45 min   Short Term Goals: Week 1:  PT Short Term Goal 1 (Week 1): Pt will perform all bed mobility from flat bed surface with consistent SUP. PT Short Term Goal 2 (Week 1): Pt will perform squat pivot transfer with overall MinA and mod cues for technique. PT Short Term Goal 3 (Week 1): pt will perform stand pivot transfers using LRAD with MinA. PT Short Term Goal 4 (Week 1): Pt will initiate stair training. PT Short Term Goal 5 (Week 1): Pt will ambulate at least 50 ft using LRAD and overall MinA.  SESSION 1 Skilled Therapeutic Interventions/Progress Updates: Patient in [facility] on entrance to room. Patient alert and agreeable to PT session.   Patient reported no pain at the beginning of session.   Therapeutic Activity: Transfers: Pt requested to use the urinal prior to leaving room. Urinal provided for patient to empty bladder, and some residue was left on patient's personal pants and underwear. Personal care provided to change patient while standing in RW in front of WC. - Patient required max cues throughout session to anteriorly scoot to edge of seat prior to stand, and to use one UE to push on WC with the other on RW. Patient given questioning cues at beginning of session with patient requiring increased time to think about next step, but could not and needed instructional cues. As the session progressed patient required min questioning cues to recall sequence. - While patient in standing for personal care (heavy minA required for truncal elevation with cues to "tuck bottom in"). PTA noticed some residue BM on seated towel. Patient requested to attempt to have BM in bathroom. Patient transported in [facility] to bathroom toilet and performed squat pivot to toilet with minA  (patient cued to use hand rail by toilet to assist with pulling self up and to pivot over. Patient released a bit of urine, but no BM. PTA provided totalA to clean posterior area of residue BM for time management.  - Patient then performed hand washing care at sink while in [facility] with supervision/modI. - Patient transported to main gym for time management and performed stand pivot with no AD from [facility] to hi/low mat with modA to maintain balance as patient presented with increased effort to coordinate sequence to pivot (external and VC provided for patient to advance LE in sequence to turn 90 degrees), and required min-modA to control descent to mat with cues to reach back in order to control said descent.   Neuromuscular Re-ed: NMR facilitated during session with focus on proprioceptive feedback on B LE, motor coordination, and weight shifting. - Forward stepping to target (orange tape) on ground with neutral BOS (yellow tape) in order to increase LE musculature coordination for functional gait dynamics. Patient performedin RW for balance support and performed multiple steps bilaterally. Progressed to placing a weight bar (taped to ground to not move) in order to increase difficulity and stepping clearance with challenged coordination. Patient required mulitmodal cues and demonstration for task at beginning, and mod cues to weight shift to contralateral side for stepping LE over. Patient presented with ataxic motions (L>R) with no signs of buckling on R knee. Patient provided with rest breaks throughout intervention.  - Patient then performed stand pivot from mat to [facility] at end of session  with cues to coordinate bigger steps per smaller/shuffled step presentation likely due to fear of R knee buckling and musculature incoordination. Patient demonstrated bigger steps with instructional/visual cues to step to desired place to turn self 90 degrees to sit. No AD and modA provided for maintaining balance this trial.    NMR performed for improvements in motor control and coordination, balance, sequencing, judgement, and self confidence/ efficacy in performing all aspects of mobility at highest level of independence.   Patient left in [facility] at end of session with brakes locked, belt alarm set, and all needs within reach. NT alerted to patient's bladder release and BM residue.  SESSION 1 Skilled Therapeutic Interventions/Progress Updates: Patient sitting in WC on entrance to room. Patient alert and agreeable to PT session.   Patient reported no pain at beginning of session, only a little bit of fatigue  Therapeutic Activity: Transfers: Pt performed squat pivot transfer from hi/low mat to [facility] with CGA-minA for guidance per patient presentation of decreased momentum. Patient educated that performing transfer in one fluid motion will make transition easier vs sequencing it slowly. Patient also performed sit to stand to RW from hi/low mat with minA at first for truncal elevation (patient cued to increase speed in tightening back extensors in order to elevate trunk per presentation increased effort and time to do so). Patient performed stand pivot to WC from mat with min-modA for balance and to advance LE in required sequence with VC and visual cues provided.   Neuromuscular Re-ed: NMR facilitated during session with focus on proprioceptive feedback on B LE, motor coordination, and weight shifting. - LAQ with 3lb ankle weight (bilateral) with cues to control motion with target used by PTA hand to hit in full extension - Forward stepping to target (orange tape) on ground with neutral BOS (yellow tape) and weight bar (taped on ground) in order to increase step clearance and LE musculature coordination for functional gait dynamics. Patient performed in RW for balance support and performed x 8 on L and R. Patient provided with VC min cues to weight shift to contralateral side for stepping LE over. Patient reported increase in R  knee pain and buckling this session compared to earlier this morning. Patient provided with rest breaks prn. Progressed to R HHA to decrease compensation of high WB on B UE (pt reported increase in R elbow pain - unrated). Patient performed x 5 with R LE, and attempted to perform on L, but was deferred per reports of fear of buckling and pain.   NMR performed for improvements in motor control and coordination, balance, sequencing, judgement, and self confidence/ efficacy in performing all aspects of mobility at highest level of independence.   Patient noted at the end of the session in room sights of bugs crawling on the floor, but also notes that they aren't real, and seeing the wall in room the previous night turn black (patient closed eyes and counted to 10 and it was gone). Attending nursing staff alerted to patient's report.  Patient in [facility] at end of session with brakes locked, belt alarm set, and all needs within reach.      Therapy Documentation Precautions:  Precautions Precautions: Fall Precaution Comments: R sided reduced coordination, R sided weakness and LLE weakness/reduced motor planning during ambulation Restrictions Weight Bearing Restrictions: No   Therapy/Group: Individual Therapy  Mekaila Tarnow PTA 08/18/2022, 12:24 PM

## 2022-08-18 NOTE — Progress Notes (Addendum)
Nursing staff and therapist report patient states he seeing bugs crawling on the floor today. His VSS and is afebrile. He is complaining of ongoing right knee pain. CT earlier in admission was done>> IMPRESSION: 1. No acute fracture or dislocation. 2. 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 3. Small knee joint effusion. 4. Chondrocalcinosis in the medial and lateral compartments.    Electronically Signed   By: Minerva Fester M.D.   On: 08/10/2022 17:21   Currently he is talking and visiting with his sister Olegario Messier who he knows and he can tell me the month, not year. Nursing reporting dark colored urine so will check UA. Getting trazodone at night. Lexapro started on 5/29. Right knee is taped, not swollen or erythematous.  Urinalysis    Component Value Date/Time   COLORURINE YELLOW 08/18/2022 1703   APPEARANCEUR CLEAR 08/18/2022 1703   LABSPEC 1.008 08/18/2022 1703   PHURINE 5.0 08/18/2022 1703   GLUCOSEU NEGATIVE 08/18/2022 1703   GLUCOSEU 250 (A) 09/14/2017 0850   HGBUR NEGATIVE 08/18/2022 1703   BILIRUBINUR NEGATIVE 08/18/2022 1703   BILIRUBINUR neg 06/19/2014 0936   KETONESUR NEGATIVE 08/18/2022 1703   PROTEINUR 30 (A) 08/18/2022 1703   UROBILINOGEN 0.2 09/14/2017 0850   NITRITE NEGATIVE 08/18/2022 1703   LEUKOCYTESUR NEGATIVE 08/18/2022 1703    Discontinue trazodone and start melatonin for sleep.

## 2022-08-19 DIAGNOSIS — I639 Cerebral infarction, unspecified: Secondary | ICD-10-CM | POA: Diagnosis not present

## 2022-08-19 LAB — URINALYSIS, ROUTINE W REFLEX MICROSCOPIC
Bilirubin Urine: NEGATIVE
Glucose, UA: >=500 mg/dL — AB
Ketones, ur: 5 mg/dL — AB
Leukocytes,Ua: NEGATIVE
Nitrite: NEGATIVE
Protein, ur: 100 mg/dL — AB
Specific Gravity, Urine: 1.018 (ref 1.005–1.030)
pH: 5 (ref 5.0–8.0)

## 2022-08-19 LAB — CBC WITH DIFFERENTIAL/PLATELET
Abs Immature Granulocytes: 0.01 10*3/uL (ref 0.00–0.07)
Basophils Absolute: 0.1 10*3/uL (ref 0.0–0.1)
Basophils Relative: 1 %
Eosinophils Absolute: 0.2 10*3/uL (ref 0.0–0.5)
Eosinophils Relative: 2 %
HCT: 42.6 % (ref 39.0–52.0)
Hemoglobin: 14.7 g/dL (ref 13.0–17.0)
Immature Granulocytes: 0 %
Lymphocytes Relative: 25 %
Lymphs Abs: 2.1 10*3/uL (ref 0.7–4.0)
MCH: 32.2 pg (ref 26.0–34.0)
MCHC: 34.5 g/dL (ref 30.0–36.0)
MCV: 93.4 fL (ref 80.0–100.0)
Monocytes Absolute: 0.7 10*3/uL (ref 0.1–1.0)
Monocytes Relative: 9 %
Neutro Abs: 5.5 10*3/uL (ref 1.7–7.7)
Neutrophils Relative %: 63 %
Platelets: 208 10*3/uL (ref 150–400)
RBC: 4.56 MIL/uL (ref 4.22–5.81)
RDW: 11.9 % (ref 11.5–15.5)
WBC: 8.6 10*3/uL (ref 4.0–10.5)
nRBC: 0 % (ref 0.0–0.2)

## 2022-08-19 LAB — COMPREHENSIVE METABOLIC PANEL
ALT: 19 U/L (ref 0–44)
AST: 21 U/L (ref 15–41)
Albumin: 3.8 g/dL (ref 3.5–5.0)
Alkaline Phosphatase: 75 U/L (ref 38–126)
Anion gap: 10 (ref 5–15)
BUN: 12 mg/dL (ref 8–23)
CO2: 27 mmol/L (ref 22–32)
Calcium: 9.9 mg/dL (ref 8.9–10.3)
Chloride: 98 mmol/L (ref 98–111)
Creatinine, Ser: 0.88 mg/dL (ref 0.61–1.24)
GFR, Estimated: >60 mL/min (ref >60)
Glucose, Bld: 256 mg/dL — ABNORMAL HIGH (ref 70–99)
Potassium: 3.9 mmol/L (ref 3.5–5.1)
Sodium: 135 mmol/L (ref 135–145)
Total Bilirubin: 0.7 mg/dL (ref 0.3–1.2)
Total Protein: 6.4 g/dL — ABNORMAL LOW (ref 6.5–8.1)

## 2022-08-19 LAB — GLUCOSE, CAPILLARY
Glucose-Capillary: 156 mg/dL — ABNORMAL HIGH (ref 70–99)
Glucose-Capillary: 205 mg/dL — ABNORMAL HIGH (ref 70–99)
Glucose-Capillary: 172 mg/dL — ABNORMAL HIGH (ref 70–99)
Glucose-Capillary: 164 mg/dL — ABNORMAL HIGH (ref 70–99)

## 2022-08-19 MED ORDER — MAGNESIUM GLUCONATE 500 MG PO TABS
250.0000 mg | ORAL_TABLET | Freq: Every day | ORAL | Status: DC
  Administered 2022-08-19 – 2022-08-22 (×4): 250 mg via ORAL
  Filled 2022-08-19 (×4): qty 1

## 2022-08-19 NOTE — Progress Notes (Signed)
Pt continuously calling out to void throughout hs q1-2hrs, voiding small amounts, between 100-200 at a time. PVR this am 196.

## 2022-08-19 NOTE — Progress Notes (Signed)
Speech Language Pathology Daily Session Note  Patient Details  Name: Anthony Woods MRN: [id number] Date of Birth: 05/18/1955  Today's Date: 08/19/2022 SLP Individual Time: 0900-1000 SLP Individual Time Calculation (min): 60 min  Short Term Goals: Week 1: SLP Short Term Goal 1 (Week 1): Patient will consume current diet with supervision level verbal cues for use of swallowing compensatory strategies. SLP Short Term Goal 2 (Week 1): Patient will utilize speech intelligibility strategies at the sentence level to achieve 90% intelligibility with Min verbal cues. SLP Short Term Goal 3 (Week 1): Patient will demonstrate selective attention for 30 mminutes to functional tasks in a mildly distracting enviornment with Min verbal cues for redirection. SLP Short Term Goal 4 (Week 1): Patient will demonstrate functional problem solving for mildly complex tasks with Min verbal cues for redirection. SLP Short Term Goal 5 (Week 1): Patient will recall new, daily information with Mod A multimodal cues for use of compensatory strategies.  Skilled Therapeutic Interventions:  Pt was seen in am to address cognitive re- training. Pt was alert and seated upright in WC upon SLP arrival. Pt reported seeing bugs in his room however, he also reported to SLP that he doesn't think they are actually there and he is just seeing things. Nursing staff is already aware of pt's hallucinations. SLP addressed orientation, recall, and problem solving during session. Pt not indep oriented to temporal or spatial concepts. Pt challenged to utilize external visual aid within room to identify location, month, date, and day of week intermittently throughout session, Pt completed task with 80% acc given min A throughout session. Pt further challenged in delayed recall of functional information including therapist names, disciplines, etc. Given a brief 3-4 minute delay, pt recalled information with 83% acc with min A. In additional minutes of  session, SLP challenged pt in problem solving. Given scenarios presented visually, SLP challenged pt to identify problems and solutions. He completed task with 50% acc indep improving to 67% acc with mod A. Pt also challenged to utilize therapy schedule to identify upcoming sessions and disciplines. Due to visual deficits and no glasses present, SLP print schedule in bigger print. Pt identifying therapist, disciplines and times with 100% acc given min A. He also demonstrated ability to identify important information including MD's name, SW, and anticipated discharge date with min to mod A. Pt was left upright in [facility] with call button within reach and chair alarm active. SLP to continue POC.   Pain Pain Assessment Pain Scale: 0-10 Pain Score: 0-No pain  Therapy/Group: Individual Therapy  Renaee Munda 08/19/2022, 9:56 AM

## 2022-08-19 NOTE — Progress Notes (Signed)
Physical Therapy Session Note  Patient Details  Name: Anthony Woods MRN: [id number] Date of Birth: 1955-10-19  Today's Date: 08/19/2022 PT Individual Time: 1055-1204 PT Individual Time Calculation (min): 69 min   Short Term Goals: Week 1:  PT Short Term Goal 1 (Week 1): Pt will perform all bed mobility from flat bed surface with consistent SUP. PT Short Term Goal 2 (Week 1): Pt will perform squat pivot transfer with overall MinA and mod cues for technique. PT Short Term Goal 3 (Week 1): pt will perform stand pivot transfers using LRAD with MinA. PT Short Term Goal 4 (Week 1): Pt will initiate stair training. PT Short Term Goal 5 (Week 1): Pt will ambulate at least 50 ft using LRAD and overall MinA.  Skilled Therapeutic Interventions/Progress Updates: Patient in [facility] on entrance to room. Patient alert and agreeable to PT session.   Patient reported no pain at beginning of session, and had R knee braced donned. Patient had moments of seeing bugs crawling on the floor at beginning of session in room. Patient cognizant of self, situation, location, and month (not day/date).   Therapeutic Activity: Transfers: Pt performed sit<>stand transfer from maxi sky with CGA for safety (maxi sky no help), and CGA - minA in RW for truncal elevation. Patient required VC for anterior scoot and placement of B UE (per presentation of pt still attempting to use B UE on RW).   Neuromuscular Re-ed: NMR facilitated during session with focus on increasing confidence in R LE due to fear of falling to R, and proprioceptive feedback on B LE, motor coordination, and weight shifting - Maxi sky donned (yellow strap) with patient instructed to step over weighted bars on floor (taped down) for functional ambulation. Patient presented at first with sliding L foot on floor 2/2 avoiding WB on R LE. Patient showed ability to perform task with functional step overs. Patient presented with ataxic movements in B LE. Patient can take  better steps starting with L than with R, and has tendency to stay in narrow BOS with cues provided to step to visual target on floor to promote neutral BOS. Patient had notable good carry over when taking a few steps back to hi/low mat with increased step length and clearance. Patient performed 3 rounds with seated rest breaks in between. Patient performed one stepping strategy (unintentional as this session was primarily focused on increasing pt step clearance and did not want to cognitively overload patient).    NMR performed for improvements in motor control and coordination, balance, sequencing, judgement, and self confidence/ efficacy in performing all aspects of mobility at highest level of independence.   Patient in [facility] at end of session with brakes locked, belt alarm set, and all needs within reach.      Therapy Documentation Precautions:  Precautions Precautions: Fall Precaution Comments: R sided reduced coordination, R sided weakness and LLE weakness/reduced motor planning during ambulation Restrictions Weight Bearing Restrictions: No   Therapy/Group: Individual Therapy  Ariyanna Oien PTA 08/19/2022, 4:53 PM

## 2022-08-19 NOTE — Progress Notes (Addendum)
Physical Therapy Session Note  Patient Details  Name: Anthony Woods MRN: [id number] Date of Birth: 11-15-1955  Today's Date: 08/19/2022 PT Individual Time: 0901-0932 PT Individual Time Calculation (min): 31 min   Short Term Goals: Week 1:  PT Short Term Goal 1 (Week 1): Pt will perform all bed mobility from flat bed surface with consistent SUP. PT Short Term Goal 2 (Week 1): Pt will perform squat pivot transfer with overall MinA and mod cues for technique. PT Short Term Goal 3 (Week 1): pt will perform stand pivot transfers using LRAD with MinA. PT Short Term Goal 4 (Week 1): Pt will initiate stair training. PT Short Term Goal 5 (Week 1): Pt will ambulate at least 50 ft using LRAD and overall MinA.  Skilled Therapeutic Interventions/Progress Updates:      Pt seated in WC upon arrival. Pt agreeable to therapy. Pt denies any pain. Pt continues to demonstration increased confusion as pt oriented to self only, and very confused when therapist returned pt to room, pt reporting 'this is not my room." Notified MD and interdisciplinary team.   Pt transported dependent in [facility] from room to main gym for time conservation. Pt ambulated with RW and CGA/min A with WC in tow 3x~40 feet, pt demos no buckling, pt reports R hand pain as pt demos tendency to rely heavily on UE support on RW, verbal cues provided for looking ahead of pt, wider BOS, as pt demos tendency to cross LE (especially with navigating turns, walk inside of RW, and heel/toe pattern.   Therapist donned R knee brace at end of session, notified next therapist to trial ambulation with no AD and R knee brace.   Pt seated in WC at end of session with all needs within reach and seatbelt alarm on.   Therapy Documentation Precautions:  Precautions Precautions: Fall Precaution Comments: R sided reduced coordination, R sided weakness and LLE weakness/reduced motor planning during ambulation Restrictions Weight Bearing Restrictions:  No  Therapy/Group: Individual Therapy  [facility], , DPT  08/19/2022, 7:42 AM

## 2022-08-19 NOTE — Progress Notes (Signed)
Occupational Therapy Session Note  Patient Details  Name: Anthony Woods MRN: [id number] Date of Birth: Sep 06, 1955  Today's Date: 08/19/2022 OT Individual Time: 1304-1401 OT Individual Time Calculation (min): 57 min    Short Term Goals: Week 1:  OT Short Term Goal 1 (Week 1): Pt will demonstrate improved R side coordination and awareness to don a shirt with set up. OT Short Term Goal 2 (Week 1): Pt will demonstrate improved R side coordination to eat independently without set up A. OT Short Term Goal 3 (Week 1): Pt will demonstrate improved static stand balance to stand at toilet with min A while he adjusts clothing over hips. OT Short Term Goal 4 (Week 1): Pt will demonstrate improved motor planning to be able to get to side of bed from supine with only min cues.  Skilled Therapeutic Interventions/Progress Updates:   Pt seen for skilled OT session this pm with strong R sided motor control focus. Sisters visiting bedside upon OT arrival and waited for pt until after session. Pt performed 25 ft w/c propulsion with mod cues for directionality out of room down hall. OT took over for energy conservation to day room nd back via w/c. Pt able to complete 12 intervals of sit to stand for functional tasks throughout session with max tolerance up to 4 minutes as pt reports fatigue from use of standing and amb harness with PT earlier day. Pt requires min cues for hand and foot placement during standing tasks with tendency to reach for RW and maintain NBOS. Once standing with RW, pt able to complete multiple intervals of ben bag reach and toss and then horseshoe (pt's preference as a leisure task prior to admission) with focus on standing balance, quad strength with squats to reach as well as midline crossing and overhead reaching. Required CGA overall with excursions >6" unilaterally. Pt with some impulsivity noted at times but overall improved clarity with RO this pm versus earlier am with team reports of  increased confusion. Pt then completed [facility] activity in sitting with R UE for 20 item 5 color simple small peg design. Overall 75% accuracy with R UE with 1/2 lb wrist weight for input and control and min cues. Once returned to room, pt left with chair alarm set, needs, nurse call button and sisters still bedside.   Pain: No pain reported thoughout session   Therapy Documentation Precautions:  Precautions Precautions: Fall Precaution Comments: R sided reduced coordination, R sided weakness and LLE weakness/reduced motor planning during ambulation Restrictions Weight Bearing Restrictions: No   Therapy/Group: Individual Therapy  Vicenta Dunning 08/19/2022, 7:38 AM

## 2022-08-19 NOTE — Progress Notes (Addendum)
PROGRESS NOTE   Subjective/Complaints: No new complaints this morning Yesterday complained to OT of nodule on right anterior chest wall, it is not painful to him  Objective: ROS: +nodule on right anterior chest wall   No results found. No results for input(s): "WBC", "HGB", "HCT", "PLT" in the last 72 hours.  No results for input(s): "NA", "K", "CL", "CO2", "GLUCOSE", "BUN", "CREATININE", "CALCIUM" in the last 72 hours.   Intake/Output Summary (Last 24 hours) at 08/19/2022 1037 Last data filed at 08/19/2022 0831 Gross per 24 hour  Intake 390 ml  Output 1550 ml  Net -1160 ml        Physical Exam: Vital Signs Blood pressure 123/77, pulse 71, temperature 97.9 F (36.6 C), resp. rate 17, height 5\' 6"  (1.676 m), weight 77 kg, SpO2 96 %. Gen: no distress, normal appearing, BMI 27.40 HEENT: oral mucosa pink and moist, NCAT, +visual deficits Cardio: HR normalized Chest: normal effort, normal rate of breathing Abd: soft, non-distended Ext: no edema Psych: pleasant, normal affect Skin: intact Neurologic: Cranial nerves II through XII intact, motor strength is 4/5 in bilateral deltoid, bicep, tricep, grip, 3- bilat hip flexor, knee extensors, ankle dorsiflexor and plantar flexor Sensory exam normal sensation to light touch and proprioception in bilateral upper and lower extremities Cerebellar exam, mild to moderate dysmetria finger to nose to finger, lower extremity not tested secondary to weakness musculoskeletal: Full range of motion in all 4 extremities. No joint swelling   Assessment/Plan: 1. Functional deficits which require 3+ hours per day of interdisciplinary therapy in a comprehensive inpatient rehab setting. Physiatrist is providing close team supervision and 24 hour management of active medical problems listed below. Physiatrist and rehab team continue to assess barriers to discharge/monitor patient progress toward  functional and medical goals  Care Tool:  Bathing    Body parts bathed by patient: Right arm, Left arm, Chest, Abdomen, Front perineal area, Right upper leg, Left upper leg, Face, Buttocks, Left lower leg, Right lower leg   Body parts bathed by helper: Buttocks, Left lower leg, Right lower leg     Bathing assist Assist Level: Minimal Assistance - Patient > 75%     Upper Body Dressing/Undressing Upper body dressing   What is the patient wearing?: Pull over shirt    Upper body assist Assist Level: Supervision/Verbal cueing    Lower Body Dressing/Undressing Lower body dressing      What is the patient wearing?: Underwear/pull up, Pants     Lower body assist Assist for lower body dressing: Minimal Assistance - Patient > 75%     Toileting Toileting    Toileting assist Assist for toileting: Minimal Assistance - Patient > 75% Assistive Device Comment: urinal   Transfers Chair/bed transfer  Transfers assist     Chair/bed transfer assist level: Minimal Assistance - Patient > 75%     Locomotion Ambulation   Ambulation assist      Assist level: Moderate Assistance - Patient 50 - 74% Assistive device: Walker-rolling Max distance: 20   Walk 10 feet activity   Assist     Assist level: Minimal Assistance - Patient > 75% Assistive device: Walker-rolling   Walk 50 feet activity  Assist Walk 50 feet with 2 turns activity did not occur: Safety/medical concerns         Walk 150 feet activity   Assist Walk 150 feet activity did not occur: Safety/medical concerns         Walk 10 feet on uneven surface  activity   Assist Walk 10 feet on uneven surfaces activity did not occur: Safety/medical concerns         Wheelchair     Assist Is the patient using a wheelchair?: Yes Type of Wheelchair: Manual    Wheelchair assist level: Dependent - Patient 0% Max wheelchair distance: 300 ft    Wheelchair 50 feet with 2 turns  activity    Assist        Assist Level: Dependent - Patient 0%   Wheelchair 150 feet activity     Assist      Assist Level: Dependent - Patient 0%   Blood pressure 123/77, pulse 71, temperature 97.9 F (36.6 C), resp. rate 17, height 5\' 6"  (1.676 m), weight 77 kg, SpO2 96 %.    Medical Problem List and Plan: 1. Functional deficits secondary to left pontine and bilateral thalamic, parietal embolic strokes             -patient may shower             -ELOS/Goals: 10 to 14 days, supervision goals PT/OT/SLP   Grounds pass ordered  2.  Atrial fibrillation: continue Eliquis             -antiplatelet therapy: aspirin    3. Pain Management: Tylenol, Robaxin as needed   4. Mood/Behavior/Sleep: LCSW to evaluate and provide emotional support             -continue Lexapro             -antipsychotic agents: n/a   5. Neuropsych/cognition: This patient is not capable of making decisions on his own behalf.   6. Skin/Wound Care: Routine skin care checks   7. Fluids/Electrolytes/Nutrition/aphasia: Routine Is and Os and follow-up chemistries -On modified diet, SLP eval pending   8: Hypertension: monitor TID and prn; at home on tenoretic             -decrease atenolol to 12.5mg  daily.    9: Hyperlipidemia: continue statin, Zetia   10: CAD s/p DES, 2018, 2019             -continue aspirin and statin, Zetia, BB   11: pAF: continue Eliquis and BB   12: DM-2: at home on metformin 1000 mg BID, Humalog 75/25 40 units, Ozempic             -continue SSI, monitor intake, CBGs QID             -Add carb controlled to dysphagia diet CBG (last 3)  Recent Labs (last 2 labs)       Recent Labs    08/13/22 2129 08/14/22 0631 08/14/22 1206  GLUCAP 288* 246* 252*    Is not on any hypoglycemic agents, will restart metformin 1000 mg twice daily and monitor. Educated regarding elevated CBGs and provided list of foods for diabetes.    13: Tobacco use: cessation counseling; continue  Nicoderm   14: Hypothyroidism: continue Synthroid   15: Overweight: dietary/exercise counseling, add magneisum gluconate 250mg  HS   16: Left foot pain: possible non-displaced fracture vs. osseous remolding 5th toe             -continue Darco shoe, buddy  wrap  17. Bradycardia: d/c atenolol  18. Hypokalemia: ordered klor once on 5/27  19. Dizziness: d/c atenolol  20. Right knee buckling/chronic pain: knee sleeve ordered, Zynex Nexwave ordered for outpatient use  21. Confusion: discussed that these can be secondary to stroke, discontinued Lexapro and Robaxin as these can negatively affect cognition, ordered labwork and UA/UC  22. Nodule on anterior chest wall: nontender, discussed may be lipoma  >50 minutes spent discussion of cognitive deficits with patient and team, that these can be secondary to stroke, discontinuing Lexapro and Robaxin as these can negatively affect cognition, ordering labwork and UA/UC, examining nodule on right anterior chest wall, discussed that this may be a lipoma, add magnesium gluconate 250mg  HS for overweight/type 2 diabetes control, ordered Zynex Nexwave for knee pain  LOS: 6 days A FACE TO FACE EVALUATION WAS PERFORMED  Anthony Woods 08/19/2022, 10:37 AM

## 2022-08-20 DIAGNOSIS — I639 Cerebral infarction, unspecified: Secondary | ICD-10-CM | POA: Diagnosis not present

## 2022-08-20 LAB — URINE CULTURE: Culture: NO GROWTH

## 2022-08-20 LAB — GLUCOSE, CAPILLARY
Glucose-Capillary: 214 mg/dL — ABNORMAL HIGH (ref 70–99)
Glucose-Capillary: 202 mg/dL — ABNORMAL HIGH (ref 70–99)
Glucose-Capillary: 152 mg/dL — ABNORMAL HIGH (ref 70–99)

## 2022-08-20 MED ORDER — ACARBOSE 25 MG PO TABS
25.0000 mg | ORAL_TABLET | Freq: Three times a day (TID) | ORAL | Status: DC
  Administered 2022-08-20 – 2022-08-24 (×11): 25 mg via ORAL
  Filled 2022-08-20 (×13): qty 1

## 2022-08-20 NOTE — Progress Notes (Signed)
PROGRESS NOTE   Subjective/Complaints: No new complaints this morning Discussed that UA and labs were not concerning for infection Cbgs elevated, will add acarbose with meals  Objective: ROS: +nodule on right anterior chest wall, +confusion as per patient and team   No results found. Recent Labs    08/19/22 1214  WBC 8.6  HGB 14.7  HCT 42.6  PLT 208    Recent Labs    08/19/22 1214  NA 135  K 3.9  CL 98  CO2 27  GLUCOSE 256*  BUN 12  CREATININE 0.88  CALCIUM 9.9     Intake/Output Summary (Last 24 hours) at 08/20/2022 1445 Last data filed at 08/20/2022 1300 Gross per 24 hour  Intake 600 ml  Output 1100 ml  Net -500 ml        Physical Exam: Vital Signs Blood pressure 118/72, pulse 88, temperature 98.4 F (36.9 C), resp. rate 15, height 5\' 6"  (1.676 m), weight 77 kg, SpO2 97 %. Gen: no distress, normal appearing, BMI 27.40 HEENT: oral mucosa pink and moist, NCAT, +visual deficits Cardio: HR normalized, decreased endurance Chest: normal effort, normal rate of breathing Abd: soft, non-distended Ext: no edema Psych: pleasant, normal affect Skin: intact Neurologic: Cranial nerves II through XII intact, motor strength is 4/5 in bilateral deltoid, bicep, tricep, grip, 3- bilat hip flexor, knee extensors, ankle dorsiflexor and plantar flexor Sensory exam normal sensation to light touch and proprioception in bilateral upper and lower extremities Cerebellar exam, mild to moderate dysmetria finger to nose to finger, lower extremity not tested secondary to weakness musculoskeletal: Full range of motion in all 4 extremities. No joint swelling   Assessment/Plan: 1. Functional deficits which require 3+ hours per day of interdisciplinary therapy in a comprehensive inpatient rehab setting. Physiatrist is providing close team supervision and 24 hour management of active medical problems listed below. Physiatrist and  rehab team continue to assess barriers to discharge/monitor patient progress toward functional and medical goals  Care Tool:  Bathing    Body parts bathed by patient: Right arm, Left arm, Chest, Abdomen, Front perineal area, Right upper leg, Left upper leg, Face, Buttocks, Left lower leg, Right lower leg   Body parts bathed by helper: Buttocks, Left lower leg, Right lower leg     Bathing assist Assist Level: Contact Guard/Touching assist     Upper Body Dressing/Undressing Upper body dressing   What is the patient wearing?: Pull over shirt    Upper body assist Assist Level: Set up assist    Lower Body Dressing/Undressing Lower body dressing      What is the patient wearing?: Underwear/pull up, Pants     Lower body assist Assist for lower body dressing: Contact Guard/Touching assist     Toileting Toileting    Toileting assist Assist for toileting: Minimal Assistance - Patient > 75% Assistive Device Comment: walker and commode over toilet   Transfers Chair/bed transfer  Transfers assist     Chair/bed transfer assist level: Minimal Assistance - Patient > 75%     Locomotion Ambulation   Ambulation assist      Assist level: Moderate Assistance - Patient 50 - 74% Assistive device: Walker-rolling Max distance:  20   Walk 10 feet activity   Assist     Assist level: Minimal Assistance - Patient > 75% Assistive device: Walker-rolling   Walk 50 feet activity   Assist Walk 50 feet with 2 turns activity did not occur: Safety/medical concerns         Walk 150 feet activity   Assist Walk 150 feet activity did not occur: Safety/medical concerns         Walk 10 feet on uneven surface  activity   Assist Walk 10 feet on uneven surfaces activity did not occur: Safety/medical concerns         Wheelchair     Assist Is the patient using a wheelchair?: Yes Type of Wheelchair: Manual    Wheelchair assist level: Dependent - Patient 0% Max  wheelchair distance: 300 ft    Wheelchair 50 feet with 2 turns activity    Assist        Assist Level: Dependent - Patient 0%   Wheelchair 150 feet activity     Assist      Assist Level: Dependent - Patient 0%   Blood pressure 118/72, pulse 88, temperature 98.4 F (36.9 C), resp. rate 15, height 5\' 6"  (1.676 m), weight 77 kg, SpO2 97 %.    Medical Problem List and Plan: 1. Functional deficits secondary to left pontine and bilateral thalamic, parietal embolic strokes             -patient may shower             -ELOS/Goals: 10 to 14 days, supervision goals PT/OT/SLP   Grounds pass ordered  Continue CIR  2.  Atrial fibrillation: continue Eliquis             -antiplatelet therapy: aspirin    3. Knee pain: Tylenol, Robaxin as needed. Continue knee sleeve.    4. Mood/Behavior/Sleep: LCSW to evaluate and provide emotional support             -continue Lexapro             -antipsychotic agents: n/a   5. Neuropsych/cognition: This patient is not capable of making decisions on his own behalf.   6. Skin/Wound Care: Routine skin care checks   7. Fluids/Electrolytes/Nutrition/aphasia: Routine Is and Os and follow-up chemistries -On modified diet, SLP eval pending   8: Hypertension: monitor TID and prn; at home on tenoretic             -decrease atenolol to 12.5mg  daily.    9: Hyperlipidemia: continue statin, Zetia   10: CAD s/p DES, 2018, 2019             -continue aspirin and statin, Zetia, BB   11: pAF: continue Eliquis and BB   12: DM-2: at home on metformin 1000 mg BID, Humalog 75/25 40 units, Ozempic             -continue SSI, monitor intake, CBGs QID             -Add carb controlled to dysphagia diet  Add acarbose 25mg  with meals CBG (last 3)  Recent Labs (last 2 labs)       Recent Labs    08/13/22 2129 08/14/22 0631 08/14/22 1206  GLUCAP 288* 246* 252*    Is not on any hypoglycemic agents, will restart metformin 1000 mg twice daily and monitor.  Educated regarding elevated CBGs and provided list of foods for diabetes.    13: Tobacco use: cessation counseling; continue Nicoderm  14: Hypothyroidism: continue Synthroid   15: Overweight: dietary/exercise counseling, add magneisum gluconate 250mg  HS   16: Left foot pain: possible non-displaced fracture vs. osseous remolding 5th toe             -continue Darco shoe, buddy wrap  17. Bradycardia: d/c atenolol  18. Hypokalemia: ordered klor once on 5/27  19. Dizziness: d/c atenolol  20. Right knee buckling/chronic pain: knee sleeve ordered, Zynex Nexwave ordered for outpatient use  21. Confusion: discussed that these can be secondary to stroke, discontinued Lexapro and Robaxin as these can negatively affect cognition, ordered labwork and UA/UC  22. Nodule on anterior chest wall: nontender, discussed may be lipoma    LOS: 7 days A FACE TO FACE EVALUATION WAS PERFORMED  Clint Bolder P Geoffery Aultman 08/20/2022, 2:45 PM

## 2022-08-20 NOTE — Progress Notes (Signed)
Occupational Therapy Weekly Progress Note  Patient Details  Name: Anthony Woods MRN: [id number] Date of Birth: Dec 21, 1955  Beginning of progress report period: Aug 14, 2022 End of progress report period: Aug 20, 2022  Today's Date: 08/20/2022 OT Individual Time: 0801-0900 OT Individual Time Calculation (min): 59 min    Patient has met 4 of 4 short term goals.  Patient showing improved activity tolerance, improved safety awareness, and improved static stand balance.  Patient continues to demonstrate the following deficits: muscle weakness and muscle joint tightness, decreased cardiorespiratoy endurance, ataxia, decreased coordination, and decreased motor planning, decreased visual acuity, decreased motor planning, decreased attention, decreased problem solving, decreased safety awareness, decreased memory, and delayed processing, central origin, and decreased standing balance, decreased postural control, hemiplegia, and decreased balance strategies and therefore will continue to benefit from skilled OT intervention to enhance overall performance with BADL.  Patient progressing toward long term goals..  Continue plan of care.  OT Short Term Goals Week 1:  OT Short Term Goal 1 (Week 1): Pt will demonstrate improved R side coordination and awareness to don a shirt with set up. OT Short Term Goal 1 - Progress (Week 1): Met OT Short Term Goal 2 (Week 1): Pt will demonstrate improved R side coordination to eat independently without set up A. OT Short Term Goal 2 - Progress (Week 1): Met OT Short Term Goal 3 (Week 1): Pt will demonstrate improved static stand balance to stand at toilet with min A while he adjusts clothing over hips. OT Short Term Goal 3 - Progress (Week 1): Met OT Short Term Goal 4 (Week 1): Pt will demonstrate improved motor planning to be able to get to side of bed from supine with only min cues. OT Short Term Goal 4 - Progress (Week 1): Met Week 2:  OT Short Term Goal 1 (Week  2): Patient to perform toileting with SBA for safety using grab bars OT Short Term Goal 2 (Week 2): Patient to perform LB dressing with set-up and SBA for safety utilizing DME PRN OT Short Term Goal 3 (Week 2): Patient to perform bathing with SBA for safety cues and DME PRN OT Short Term Goal 4 (Week 2): Patient to display improved BUE coordination by opening containers on meal tray with vc's as needed  Skilled Therapeutic Interventions/Progress Updates:    Patient received supine in bed - stating - I want to go home.  Patient agreeable to OT session and sat at edge of bed without physical assistance.  Patient initially declined shower, but indicated need to void.  Transferred into bathroom for continent void.  Then agreeable to shower.  Patient able to walk from toilet to shower bench with mod assist to align trunk.  Patient with STRONG pull toward right with every attempt to stand on right leg.  With cueing, patient able to maintain midline and easily step thru with either foot.  Patient able to follow cueing and mod facilitation.  Dressed self with only intermittent min assist.  Practiced functional walking without walker and facilitation for postural control in midline - worked on foot placement, upright control, step length, and turning, walking backwards as needed for ADL.  Goals are supervision as per chart wife is unable to provide physical assistance.   Patient left up in wheelchair with chair pad alarm in place and engaged and call bell/ personal items in reach.    Therapy Documentation Precautions:  Precautions Precautions: Fall Precaution Comments: R sided reduced coordination, R sided weakness  and LLE weakness/reduced motor planning during ambulation Restrictions Weight Bearing Restrictions: No  Pain: Pain Assessment Pain Scale: 0-10 Pain Score: 0-No pain   Therapy/Group: Individual Therapy  [name] 08/20/2022, 12:30 PM

## 2022-08-20 NOTE — Progress Notes (Signed)
Pt has been awake majority of the shift tonight. Pt was given his nightly melatonin and continued to be restless and irritated. Pt called out to use the bathroom every 30-45 mins with no results. Pt did have a couple impulsive episodes throughout the night, wanting to get up and "roam around" Pt states that he doesn't sleep well at night and wonders around at home all throughout the night and that's the reason why he had multiple falls. Staff had to redirected pt about the time time of night and encourage him to get some rest in order to participate in therapy this morning. Pt is very pleasant, just restless. No further concerns at this time. Call bell in reach, bed alarm on.

## 2022-08-20 NOTE — Progress Notes (Signed)
Occupational Therapy Weekly Progress Note  Patient Details  Name: Anthony Woods MRN: [id number] Date of Birth: 1955/10/31  Beginning of progress report period: Aug 14, 2022 End of progress report period: Aug 20, 2022  Today's Date: 08/20/2022 OT Individual Time:1100-1200; 4098-1191 OT Individual Time Calculation (min): 105 min    Patient has met 4 of 4 short term goals.    Patient continues to demonstrate the following deficits: muscle weakness and decreased coordination and decreased motor planning and therefore will continue to benefit from skilled OT intervention to enhance overall performance with BADL.  Patient progressing toward long term goals..  Continue plan of care.  OT Short Term Goals Week 1:  OT Short Term Goal 1 (Week 1): Pt will demonstrate improved R side coordination and awareness to don a shirt with set up. OT Short Term Goal 1 - Progress (Week 1): Met OT Short Term Goal 2 (Week 1): Pt will demonstrate improved R side coordination to eat independently without set up A. OT Short Term Goal 2 - Progress (Week 1): Met OT Short Term Goal 3 (Week 1): Pt will demonstrate improved static stand balance to stand at toilet with min A while he adjusts clothing over hips. OT Short Term Goal 3 - Progress (Week 1): Met OT Short Term Goal 4 (Week 1): Pt will demonstrate improved motor planning to be able to get to side of bed from supine with only min cues. OT Short Term Goal 4 - Progress (Week 1): Met Week 2:  OT Short Term Goal 1 (Week 2): Patient to perform toileting with SBA for safety using grab bars OT Short Term Goal 2 (Week 2): Patient to perform LB dressing with set-up and SBA for safety utilizing DME PRN OT Short Term Goal 3 (Week 2): Patient to perform bathing with SBA for safety cues and DME PRN OT Short Term Goal 4 (Week 2): Patient to display improved BUE coordination by opening containers on meal tray with vc's as needed  Skilled Therapeutic Interventions/Progress  Updates:  Visit 1: Patient seen for assessment of functional goals as listed above. Patient progressing with coordination and motor planning enabling improved self care performance. Patient is functionally limited by knee pain and LE weakness. Barriers impacting CLOF include: visual impairments, Muscle weakness,  Knee pain, motor planning deficits and endurance. Patient is showing improvements with strength coordination and motor planning allowing him to meet all week 1 STG's and progress towards LTG's. Continued treatment session with UE strength and endurance activity using 4 lb dowel, performing 2 x 10 reps in varied planes. Continued with grasp and pinch strength using black clothes pins to pick up 1/2 inch width shapes to lift and place into basket. Good tolerance and performance with strength and coordination activities.   Visit 2: Patient seen for home safety education and discharge planning with wife present. Patient and his wife stated areas of concern in the home include the ramp rental to get into the home,the small shower stall and the height of the bed. Assisted patient to toilet at start of treatment session. Patient stood with CGA for balance and provided incidental help to straighten pants. Patient able to ambulate back to sink with CGA using the walker. Following handwashing sitting at the sink patient agreeable to working on getting in and out of the hospital bed raised to the estimated height of his bed at home with rails down and HOB flat. Patient able to safely  sit and transition from EOB to supine with  min vc's to improve safety. Patient and his wife commented that they have family/friend that could put in a bed rail as necessary. Patient and wife educated on DME needs and bathroom safety for patient at current level while expecting significant gains prior to discharge. Patient and wife motivated to continue with training and education including functional shower stall transfer training  closer to discharge. Continue with skilled OT POC and revised STG's     Therapy Documentation Precautions:  Precautions Precautions: Fall Precaution Comments: R sided reduced coordination, R sided weakness and LLE weakness/reduced motor planning during ambulation Restrictions Weight Bearing Restrictions: No   Pain:Reports mild pain in knees   ADL: ADL Eating: Set up Grooming: Setup Upper Body Bathing: Supervision/safety Lower Body Bathing: Moderate assistance Upper Body Dressing: Minimal assistance Lower Body Dressing: Moderate assistance Toileting: Moderate assistance Where Assessed-Toileting: Teacher, adult education: Curator Method: Ambulance person: Grab bars Vision- Needs high contrast due to decreased acuity        Therapy/Group: Individual Therapy  Anthony Woods 08/20/2022, 1:50 PM

## 2022-08-20 NOTE — Progress Notes (Signed)
Physical Therapy Session Note  Patient Details  Name: Anthony Woods MRN: [id number] Date of Birth: [birth year]  Today's Date: 08/20/2022 PT Individual Time:  -      Short Term Goals: Week 1:  PT Short Term Goal 1 (Week 1): Pt will perform all bed mobility from flat bed surface with consistent SUP. PT Short Term Goal 2 (Week 1): Pt will perform squat pivot transfer with overall MinA and mod cues for technique. PT Short Term Goal 3 (Week 1): pt will perform stand pivot transfers using LRAD with MinA. PT Short Term Goal 4 (Week 1): Pt will initiate stair training. PT Short Term Goal 5 (Week 1): Pt will ambulate at least 50 ft using LRAD and overall MinA.  Skilled Therapeutic Interventions/Progress Updates:    Pt presents in room in [facility] and agreeable to PT. Pt denies pain at this time. Session focused on gait training and NMR with decreased UE support to facilitate dynamic standing balance, weightshifting, and BLE coordination. Pt completes sit<>stand transfers with and without RW with CGA during session.  Pt self propels WC min assist to supervision from room to main gym with BUE and BLE to improve independence with mobility as well as warm up for BUE/BLEs prior to gait training. Verbal cues for obstacle negotiation and directional cues.  Pt ambulates 87' with RW CGA/min assist, R knee brace donned, no knee buckling noted, verbal cues to widen BOS and increase proximity to RW as well as positioning prior to sit. Pt completes second trial gait with RW CGA/min assist 100', increased scissoring noted with turns and fatigue, decreased proximity to RW requires mod verbal and visual cues to correct.  Pt completes NMR for dynamic standing balance, BLE coordination and weightshifting including: - Standing marching, RUE HHA x20 alternating BLE (cues to widen BOS and weightshifting) requires min assist for stability - lateral step BLE with opposite UE HHA x10 (min assist for stability)  Pt transported  back to room in [facility] dependently for time management. Pt remains seated in [facility] with all needs with reach, call light in place and chair alarm activated at end of session.  Therapy Documentation Precautions:  Precautions Precautions: Fall Precaution Comments: R sided reduced coordination, R sided weakness and LLE weakness/reduced motor planning during ambulation Restrictions Weight Bearing Restrictions: No  Therapy/Group: Individual Therapy  Edwin Cap 08/20/2022, 10:35 AM

## 2022-08-21 DIAGNOSIS — R451 Restlessness and agitation: Secondary | ICD-10-CM | POA: Diagnosis not present

## 2022-08-21 DIAGNOSIS — I639 Cerebral infarction, unspecified: Secondary | ICD-10-CM | POA: Diagnosis not present

## 2022-08-21 DIAGNOSIS — E119 Type 2 diabetes mellitus without complications: Secondary | ICD-10-CM

## 2022-08-21 DIAGNOSIS — I10 Essential (primary) hypertension: Secondary | ICD-10-CM | POA: Diagnosis not present

## 2022-08-21 DIAGNOSIS — Z794 Long term (current) use of insulin: Secondary | ICD-10-CM

## 2022-08-21 LAB — GLUCOSE, CAPILLARY
Glucose-Capillary: 205 mg/dL — ABNORMAL HIGH (ref 70–99)
Glucose-Capillary: 200 mg/dL — ABNORMAL HIGH (ref 70–99)
Glucose-Capillary: 255 mg/dL — ABNORMAL HIGH (ref 70–99)
Glucose-Capillary: 97 mg/dL (ref 70–99)

## 2022-08-21 MED ORDER — INSULIN ASPART PROT & ASPART (70-30 MIX) 100 UNIT/ML ~~LOC~~ SUSP
6.0000 [IU] | Freq: Two times a day (BID) | SUBCUTANEOUS | Status: DC
  Administered 2022-08-21: 6 [IU] via SUBCUTANEOUS
  Filled 2022-08-21: qty 10

## 2022-08-21 MED ORDER — QUETIAPINE FUMARATE 25 MG PO TABS
25.0000 mg | ORAL_TABLET | Freq: Every day | ORAL | Status: DC
  Administered 2022-08-21: 25 mg via ORAL
  Filled 2022-08-21: qty 1

## 2022-08-21 NOTE — Progress Notes (Addendum)
PROGRESS NOTE   Subjective/Complaints: Pt resting this morning. Had no complaints. Nursing reports that pt was up all night, confused and restless.   Objective: ROS: Limited due to cognitive/behavioral    No results found. Recent Labs    08/19/22 1214  WBC 8.6  HGB 14.7  HCT 42.6  PLT 208    Recent Labs    08/19/22 1214  NA 135  K 3.9  CL 98  CO2 27  GLUCOSE 256*  BUN 12  CREATININE 0.88  CALCIUM 9.9     Intake/Output Summary (Last 24 hours) at 08/21/2022 0954 Last data filed at 08/21/2022 0919 Gross per 24 hour  Intake 712 ml  Output 700 ml  Net 12 ml        Physical Exam: Vital Signs Blood pressure (!) 147/96, pulse 89, temperature 98.1 F (36.7 C), resp. rate 18, height 5\' 6"  (1.676 m), weight 77 kg, SpO2 100 %. Constitutional: No distress . Vital signs reviewed. HEENT: NCAT, EOMI, oral membranes moist Neck: supple Cardiovascular: RRR without murmur. No JVD    Respiratory/Chest: CTA Bilaterally without wheezes or rales. Normal effort    GI/Abdomen: BS +, non-tender, non-distended Ext: no clubbing, cyanosis, or edema Psych: pleasant and cooperative  Skin: intact Neurologic: Cranial nerves II through XII intact, motor strength is 4/5 in bilateral deltoid, bicep, tricep, grip, 3- bilat hip flexor, knee extensors, ankle dorsiflexor and plantar flexor Sensory exam normal for light touch and pain in all 4 limbs. No limb ataxia or cerebellar signs. No abnormal tone appreciated.   musculoskeletal: Full range of motion in all 4 extremities. No joint swelling   Assessment/Plan: 1. Functional deficits which require 3+ hours per day of interdisciplinary therapy in a comprehensive inpatient rehab setting. Physiatrist is providing close team supervision and 24 hour management of active medical problems listed below. Physiatrist and rehab team continue to assess barriers to discharge/monitor patient progress  toward functional and medical goals  Care Tool:  Bathing    Body parts bathed by patient: Right arm, Left arm, Chest, Abdomen, Front perineal area, Right upper leg, Left upper leg, Face, Buttocks, Left lower leg, Right lower leg   Body parts bathed by helper: Buttocks, Left lower leg, Right lower leg     Bathing assist Assist Level: Contact Guard/Touching assist     Upper Body Dressing/Undressing Upper body dressing   What is the patient wearing?: Pull over shirt    Upper body assist Assist Level: Set up assist    Lower Body Dressing/Undressing Lower body dressing      What is the patient wearing?: Underwear/pull up, Pants     Lower body assist Assist for lower body dressing: Contact Guard/Touching assist     Toileting Toileting    Toileting assist Assist for toileting: Minimal Assistance - Patient > 75% Assistive Device Comment: walker and commode over toilet   Transfers Chair/bed transfer  Transfers assist     Chair/bed transfer assist level: Minimal Assistance - Patient > 75%     Locomotion Ambulation   Ambulation assist      Assist level: Moderate Assistance - Patient 50 - 74% Assistive device: Walker-rolling Max distance: 20   Walk  10 feet activity   Assist     Assist level: Minimal Assistance - Patient > 75% Assistive device: Walker-rolling   Walk 50 feet activity   Assist Walk 50 feet with 2 turns activity did not occur: Safety/medical concerns         Walk 150 feet activity   Assist Walk 150 feet activity did not occur: Safety/medical concerns         Walk 10 feet on uneven surface  activity   Assist Walk 10 feet on uneven surfaces activity did not occur: Safety/medical concerns         Wheelchair     Assist Is the patient using a wheelchair?: Yes Type of Wheelchair: Manual    Wheelchair assist level: Dependent - Patient 0% Max wheelchair distance: 300 ft    Wheelchair 50 feet with 2 turns  activity    Assist        Assist Level: Dependent - Patient 0%   Wheelchair 150 feet activity     Assist      Assist Level: Dependent - Patient 0%   Blood pressure (!) 147/96, pulse 89, temperature 98.1 F (36.7 C), resp. rate 18, height 5\' 6"  (1.676 m), weight 77 kg, SpO2 100 %.    Medical Problem List and Plan: 1. Functional deficits secondary to left pontine and bilateral thalamic, parietal embolic strokes             -patient may shower             -ELOS/Goals: 10 to 14 days, supervision goals PT/OT/SLP   Grounds pass ordered  -Continue CIR therapies including PT, OT, and SLP   2.  Atrial fibrillation: continue Eliquis             -antiplatelet therapy: aspirin    3. Knee pain: Tylenol, Robaxin as needed. Continue knee sleeve.    4. Mood/Behavior/Sleep: LCSW to evaluate and provide emotional support             -continue Lexapro             -antipsychotic agents: n/a   -6/1 pt confused, up at night, agitated/restless   -UCX negative   -add low dose seroquel 25mg    -keep sleep chart 5. Neuropsych/cognition: This patient is not capable of making decisions on his own behalf.   6. Skin/Wound Care: Routine skin care checks   7. Fluids/Electrolytes/Nutrition/aphasia: Routine Is and Os and follow-up chemistries -On modified diet    8: Hypertension: monitor TID and prn; at home on tenoretic             -decrease atenolol to 12.5mg  daily.    6/1 bp elevated this morning, but generally has been controlled otherwise 9: Hyperlipidemia: continue statin, Zetia   10: CAD s/p DES, 2018, 2019             -continue aspirin and statin, Zetia, BB   11: pAF: continue Eliquis and BB   12: DM-2: at home on metformin 1000 mg BID, Humalog 75/25 40 units, Ozempic             -continue SSI, monitor intake, CBGs QID             -Add carb controlled to dysphagia diet  Add acarbose 25mg  with meals CBG (last 3)  Recent Labs (last 2 labs)       Recent Labs    08/13/22 2129  08/14/22 0631 08/14/22 1206  GLUCAP 288* 246* 252*    Is  not on any hypoglycemic agents, will restart metformin 1000 mg twice daily and monitor.  6/1 cbg's remain poorly controlled. Only on metformin  -begin 70/30 insulin bid at 6 units   13: Tobacco use: cessation counseling; continue Nicoderm   14: Hypothyroidism: continue Synthroid   15: Overweight: dietary/exercise counseling, add magneisum gluconate 250mg  HS   16: Left foot pain: possible non-displaced fracture vs. osseous remolding 5th toe             -continue Darco shoe, buddy wrap  17. Bradycardia: d/c atenolol  18. Hypokalemia: ordered klor once on 5/27  19. Dizziness: d/c atenolol  20. Right knee buckling/chronic pain: knee sleeve ordered, Zynex Nexwave ordered for outpatient use  22. Nodule on anterior chest wall: nontender, discussed may be lipoma    LOS: 8 days A FACE TO FACE EVALUATION WAS PERFORMED  Ranelle Oyster 08/21/2022, 9:54 AM

## 2022-08-21 NOTE — Progress Notes (Signed)
Pt has continuously called out and has not slept at all tonight. Pt states that he can not sleep and wants to get up and walk around, proceeding to set the bed alarm off all night. Staff has entered this room multiple times throughout the night, redirecting pt to get some sleep and to stay in the bed. Pt was given his scheduled melatonin but it does not seem to help. Pt continues to have incontinence episodes and makes the statement "I wanna go to the bathroom, so I can get out of the bed" no further concerns at this time, call bell in reach, be alarm on.

## 2022-08-22 DIAGNOSIS — E119 Type 2 diabetes mellitus without complications: Secondary | ICD-10-CM | POA: Diagnosis not present

## 2022-08-22 DIAGNOSIS — R451 Restlessness and agitation: Secondary | ICD-10-CM | POA: Diagnosis not present

## 2022-08-22 DIAGNOSIS — I10 Essential (primary) hypertension: Secondary | ICD-10-CM | POA: Diagnosis not present

## 2022-08-22 DIAGNOSIS — I639 Cerebral infarction, unspecified: Secondary | ICD-10-CM | POA: Diagnosis not present

## 2022-08-22 LAB — GLUCOSE, CAPILLARY
Glucose-Capillary: 198 mg/dL — ABNORMAL HIGH (ref 70–99)
Glucose-Capillary: 281 mg/dL — ABNORMAL HIGH (ref 70–99)
Glucose-Capillary: 270 mg/dL — ABNORMAL HIGH (ref 70–99)
Glucose-Capillary: 108 mg/dL — ABNORMAL HIGH (ref 70–99)
Glucose-Capillary: 145 mg/dL — ABNORMAL HIGH (ref 70–99)
Glucose-Capillary: 96 mg/dL (ref 70–99)

## 2022-08-22 MED ORDER — QUETIAPINE FUMARATE 50 MG PO TABS
50.0000 mg | ORAL_TABLET | Freq: Every day | ORAL | Status: DC
  Administered 2022-08-22 – 2022-09-02 (×12): 50 mg via ORAL
  Filled 2022-08-22 (×12): qty 1

## 2022-08-22 MED ORDER — INSULIN ASPART PROT & ASPART (70-30 MIX) 100 UNIT/ML ~~LOC~~ SUSP
10.0000 [IU] | Freq: Two times a day (BID) | SUBCUTANEOUS | Status: DC
  Administered 2022-08-22 – 2022-09-01 (×20): 10 [IU] via SUBCUTANEOUS
  Filled 2022-08-22: qty 10

## 2022-08-22 MED ORDER — ACETAMINOPHEN 325 MG PO TABS
325.0000 mg | ORAL_TABLET | ORAL | Status: DC | PRN
  Administered 2022-08-22 – 2022-08-27 (×3): 650 mg via ORAL
  Filled 2022-08-22 (×3): qty 2

## 2022-08-22 NOTE — Progress Notes (Signed)
Physical Therapy Session Note  Patient Details  Name: Anthony Woods MRN: [id number] Date of Birth: 22-Jun-1955  Today's Date: 08/22/2022 PT Individual Time: 0800-0900 PT Individual Time Calculation (min): 60 min   Short Term Goals: Week 2:     Skilled Therapeutic Interventions/Progress Updates: Pt presents semi-reclined in bed and agreeable to therapy.  Pt sits up long sitting in bed and dons socks and pullover shirt.  Pt transfers to EOB and requires pants threaded over B feet.  Brief attached around LES and shoes donned w/ mod A.  Knee brace donned w/ total A.  Pt stands w/ CGA and requires min A for pulling up brief and then pants.  Pt amb x 6' to w/c w/ RW and CGA/min A.  Pt wheeled to dayroom for energy conservation.  Pt transfers w/c > Nu-step w/ min/CGA.  Pt performed x 5' at level 4 for increased strength and NMR to RLE, cueing for maintaining neutral rotation RLE.  Pt amb multiple trials w/ RW and min A, verbal cues for BOS and upright stance.  Pt required mirror for visual input to maintain BOS, esp. RLE placement.  Pt requires verbal cues and manual assist for walker management to perform safe approach and turns to w/c.  Pt amb up to 80' including turns to return to seat.  Pt amb x 20' w/ RW and min A to recliner and remained sitting w/ LES elevated and chair alarm on, all needs in reach.       Therapy Documentation Precautions:  Precautions Precautions: Fall Precaution Comments: R sided reduced coordination, R sided weakness and LLE weakness/reduced motor planning during ambulation Restrictions Weight Bearing Restrictions: No General:   Vital Signs:  Pain:4/10 LB. Pain Assessment Pain Scale: 0-10 Pain Score: 0-No pain    Therapy/Group: Individual Therapy  Lucio Edward 08/22/2022, 10:59 AM

## 2022-08-22 NOTE — Progress Notes (Addendum)
Speech Language Pathology Daily Session Note  Patient Details  Name: Anthony Woods MRN: [id number] Date of Birth: 07/01/1955  Today's Date: 08/22/2022 SLP Individual Time: 1005-1100 SLP Individual Time Calculation (min): 55 min  Short Term Goals: Week 1: SLP Short Term Goal 1 (Week 1): Patient will consume current diet with supervision level verbal cues for use of swallowing compensatory strategies. SLP Short Term Goal 2 (Week 1): Patient will utilize speech intelligibility strategies at the sentence level to achieve 90% intelligibility with Min verbal cues. SLP Short Term Goal 3 (Week 1): Patient will demonstrate selective attention for 30 mminutes to functional tasks in a mildly distracting enviornment with Min verbal cues for redirection. SLP Short Term Goal 4 (Week 1): Patient will demonstrate functional problem solving for mildly complex tasks with Min verbal cues for redirection. SLP Short Term Goal 5 (Week 1): Patient will recall new, daily information with Mod A multimodal cues for use of compensatory strategies.  Skilled Therapeutic Interventions:  Pt was seen for skilled ST targeting cognitive and dysphagia goals.  Pt was awake and alert in recliner upon arrival.  He reports feeling "crappy" due to not sleeping much last night but was agreeable to participating in therapy and was pleasantly interactive throughout therapy session.  Pt was oriented to place, date, situation and month but was off by 10 days for exact date without use of external aids.  SLP facilitated the session with a functional snack of regular textures to monitor diet toleration and readiness/appropriateness for advancement.  Pt consumed advanced textures without difficulty.  He cleared the oral cavity completely after swallowing and demonstrated no overt s/s of aspiration with solids or liquids.  Pt reports he avoided textures like steak and only ate softer foods at baseline due to only having one tooth.  Suspect pt is  near his baseline for swallowing function.  SLP also facilitated the session with a novel card game targeting problem solving goals.  Pt planned and executed a problem solving strategy within task with max faded to min assist verbal cues for working memory of task rules and procedures.  Pt also benefited from max cues for recall of speech intelligibility strategies given his reports of ongoing changes to his speech.  Pt was left in recliner with chair alarm set and call bell within reach.  Continue per current plan of care.    Pain Pain Assessment Pain Scale: 0-10 Pain Score: 0-No pain  Therapy/Group: Individual Therapy  Liviah Cake, Melanee Spry 08/22/2022, 12:32 PM

## 2022-08-22 NOTE — Progress Notes (Signed)
PROGRESS NOTE   Subjective/Complaints: Pt didn't rest much last night per RN. No sleep chart kept. Pt reports poor sleep also. He received melatonin and seroquel   Objective: ROS: somewhat limited due to cognitive/behavioral    No results found. Recent Labs    08/19/22 1214  WBC 8.6  HGB 14.7  HCT 42.6  PLT 208    Recent Labs    08/19/22 1214  NA 135  K 3.9  CL 98  CO2 27  GLUCOSE 256*  BUN 12  CREATININE 0.88  CALCIUM 9.9     Intake/Output Summary (Last 24 hours) at 08/22/2022 1029 Last data filed at 08/22/2022 0716 Gross per 24 hour  Intake 600 ml  Output 1150 ml  Net -550 ml        Physical Exam: Vital Signs Blood pressure (!) 150/105, pulse 96, temperature 97.7 F (36.5 C), temperature source Oral, resp. rate 18, height 5\' 6"  (1.676 m), weight 77 kg, SpO2 100 %. Constitutional: No distress . Vital signs reviewed. HEENT: NCAT, EOMI, oral membranes moist Neck: supple Cardiovascular: RRR without murmur. No JVD    Respiratory/Chest: CTA Bilaterally without wheezes or rales. Normal effort    GI/Abdomen: BS +, non-tender, non-distended Ext: no clubbing, cyanosis, or edema Psych: pleasant and cooperative  Skin: intact Neurologic: distracted. Oriented to self, month, year. Didn't know where he was. Cranial nerves II through XII intact, motor strength is 4/5 in bilateral deltoid, bicep, tricep, grip, 3- bilat hip flexor, knee extensors, ankle dorsiflexor and plantar flexor Sensory exam normal for light touch and pain in all 4 limbs. No limb ataxia or cerebellar signs. No abnormal tone appreciated.   musculoskeletal: Full range of motion in all 4 extremities. No joint swelling   Assessment/Plan: 1. Functional deficits which require 3+ hours per day of interdisciplinary therapy in a comprehensive inpatient rehab setting. Physiatrist is providing close team supervision and 24 hour management of active medical  problems listed below. Physiatrist and rehab team continue to assess barriers to discharge/monitor patient progress toward functional and medical goals  Care Tool:  Bathing    Body parts bathed by patient: Right arm, Left arm, Chest, Abdomen, Front perineal area, Right upper leg, Left upper leg, Face, Buttocks, Left lower leg, Right lower leg   Body parts bathed by helper: Buttocks, Left lower leg, Right lower leg     Bathing assist Assist Level: Contact Guard/Touching assist     Upper Body Dressing/Undressing Upper body dressing   What is the patient wearing?: Pull over shirt    Upper body assist Assist Level: Set up assist    Lower Body Dressing/Undressing Lower body dressing      What is the patient wearing?: Underwear/pull up, Pants     Lower body assist Assist for lower body dressing: Contact Guard/Touching assist     Toileting Toileting    Toileting assist Assist for toileting: Minimal Assistance - Patient > 75% Assistive Device Comment: walker and commode over toilet   Transfers Chair/bed transfer  Transfers assist     Chair/bed transfer assist level: Minimal Assistance - Patient > 75%     Locomotion Ambulation   Ambulation assist  Assist level: Moderate Assistance - Patient 50 - 74% Assistive device: Walker-rolling Max distance: 20   Walk 10 feet activity   Assist     Assist level: Minimal Assistance - Patient > 75% Assistive device: Walker-rolling   Walk 50 feet activity   Assist Walk 50 feet with 2 turns activity did not occur: Safety/medical concerns         Walk 150 feet activity   Assist Walk 150 feet activity did not occur: Safety/medical concerns         Walk 10 feet on uneven surface  activity   Assist Walk 10 feet on uneven surfaces activity did not occur: Safety/medical concerns         Wheelchair     Assist Is the patient using a wheelchair?: Yes Type of Wheelchair: Manual    Wheelchair  assist level: Dependent - Patient 0% Max wheelchair distance: 300 ft    Wheelchair 50 feet with 2 turns activity    Assist        Assist Level: Dependent - Patient 0%   Wheelchair 150 feet activity     Assist      Assist Level: Dependent - Patient 0%   Blood pressure (!) 150/105, pulse 96, temperature 97.7 F (36.5 C), temperature source Oral, resp. rate 18, height 5\' 6"  (1.676 m), weight 77 kg, SpO2 100 %.    Medical Problem List and Plan: 1. Functional deficits secondary to left pontine and bilateral thalamic, parietal embolic strokes             -patient may shower             -ELOS/Goals: 10 to 14 days, supervision goals PT/OT/SLP   Grounds pass ordered  -Continue CIR therapies including PT, OT, and SLP   2.  Atrial fibrillation: continue Eliquis             -antiplatelet therapy: aspirin    3. Knee pain: Tylenol, Robaxin as needed. Continue knee sleeve.    4. Mood/Behavior/Sleep: LCSW to evaluate and provide emotional support             -continue Lexapro             -antipsychotic agents: n/a   -6/2 night time restlessness and agitation this weekend   -UCX negative   -increase seroquel to 50mg  at 2000   -NEED to keep sleep chart 5. Neuropsych/cognition: This patient is not capable of making decisions on his own behalf.   6. Skin/Wound Care: Routine skin care checks   7. Fluids/Electrolytes/Nutrition/aphasia: Routine Is and Os and follow-up chemistries -On modified diet    8: Hypertension: monitor TID and prn; at home on tenoretic             -decrease atenolol to 12.5mg  daily.    6/2 bp still elevated this morning, increase atenolol to 25mg  daily 9: Hyperlipidemia: continue statin, Zetia   10: CAD s/p DES, 2018, 2019             -continue aspirin and statin, Zetia, BB   11: pAF: continue Eliquis and BB   12: DM-2: at home on metformin 1000 mg BID, Humalog 75/25 40 units, Ozempic             -continue SSI, monitor intake, CBGs QID              -Add carb controlled to dysphagia diet  Add acarbose 25mg  with meals CBG (last 3)  Recent Labs (last 2 labs)  Recent Labs    08/13/22 2129 08/14/22 0631 08/14/22 1206  GLUCAP 288* 246* 252*    Is not on any hypoglycemic agents, will restart metformin 1000 mg twice daily and monitor.  6/1 cbg's remain poorly controlled. Only on metformin  -begin 70/30 insulin bid at 6 units 6/2 increase to 10u bid 13: Tobacco use: cessation counseling; continue Nicoderm   14: Hypothyroidism: continue Synthroid   15: Overweight: dietary/exercise counseling, add magneisum gluconate 250mg  HS   16: Left foot pain: possible non-displaced fracture vs. osseous remolding 5th toe             -continue Darco shoe, buddy wrap  17. Bradycardia: d/c atenolol  18. Hypokalemia: ordered klor once on 5/27  19. Dizziness: d/c atenolol  20. Right knee buckling/chronic pain: knee sleeve ordered, Zynex Nexwave ordered for outpatient use  22. Nodule on anterior chest wall: nontender, discussed may be lipoma    LOS: 9 days A FACE TO FACE EVALUATION WAS PERFORMED  Ranelle Oyster 08/22/2022, 10:29 AM

## 2022-08-23 DIAGNOSIS — I639 Cerebral infarction, unspecified: Secondary | ICD-10-CM | POA: Diagnosis not present

## 2022-08-23 LAB — CBC
HCT: 40.5 % (ref 39.0–52.0)
Hemoglobin: 14.2 g/dL (ref 13.0–17.0)
MCH: 31.4 pg (ref 26.0–34.0)
MCHC: 35.1 g/dL (ref 30.0–36.0)
MCV: 89.6 fL (ref 80.0–100.0)
Platelets: 185 10*3/uL (ref 150–400)
RBC: 4.52 MIL/uL (ref 4.22–5.81)
RDW: 11.9 % (ref 11.5–15.5)
WBC: 8.4 10*3/uL (ref 4.0–10.5)
nRBC: 0 % (ref 0.0–0.2)

## 2022-08-23 LAB — BASIC METABOLIC PANEL
Anion gap: 8 (ref 5–15)
BUN: 11 mg/dL (ref 8–23)
CO2: 28 mmol/L (ref 22–32)
Calcium: 9.5 mg/dL (ref 8.9–10.3)
Chloride: 102 mmol/L (ref 98–111)
Creatinine, Ser: 0.97 mg/dL (ref 0.61–1.24)
GFR, Estimated: >60 mL/min (ref >60)
Glucose, Bld: 120 mg/dL — ABNORMAL HIGH (ref 70–99)
Potassium: 3.4 mmol/L — ABNORMAL LOW (ref 3.5–5.1)
Sodium: 138 mmol/L (ref 135–145)

## 2022-08-23 LAB — GLUCOSE, CAPILLARY
Glucose-Capillary: 120 mg/dL — ABNORMAL HIGH (ref 70–99)
Glucose-Capillary: 154 mg/dL — ABNORMAL HIGH (ref 70–99)
Glucose-Capillary: 165 mg/dL — ABNORMAL HIGH (ref 70–99)
Glucose-Capillary: 80 mg/dL (ref 70–99)

## 2022-08-23 MED ORDER — POLYETHYLENE GLYCOL 3350 17 G PO PACK
17.0000 g | PACK | Freq: Two times a day (BID) | ORAL | Status: DC
  Administered 2022-08-23 – 2022-09-01 (×12): 17 g via ORAL
  Filled 2022-08-23 (×21): qty 1

## 2022-08-23 MED ORDER — POTASSIUM CHLORIDE 20 MEQ PO PACK
40.0000 meq | PACK | Freq: Once | ORAL | Status: AC
  Administered 2022-08-23: 40 meq via ORAL
  Filled 2022-08-23: qty 2

## 2022-08-23 MED ORDER — SORBITOL 70 % SOLN
60.0000 mL | Freq: Once | Status: AC
  Administered 2022-08-23: 60 mL via ORAL
  Filled 2022-08-23: qty 60

## 2022-08-23 MED ORDER — MAGNESIUM GLUCONATE 500 MG PO TABS
500.0000 mg | ORAL_TABLET | Freq: Every day | ORAL | Status: DC
  Administered 2022-08-23 – 2022-09-02 (×11): 500 mg via ORAL
  Filled 2022-08-23 (×11): qty 1

## 2022-08-23 NOTE — Progress Notes (Signed)
Physical Therapy Session Note  Patient Details  Name: Anthony Woods MRN: [id number] Date of Birth: 04-02-1955  Today's Date: 08/23/2022 PT Individual Time: 1543-1640 PT Individual Time Calculation (min): 57 min   Short Term Goals: Week 1:  PT Short Term Goal 1 (Week 1): Pt will perform all bed mobility from flat bed surface with consistent SUP. PT Short Term Goal 2 (Week 1): Pt will perform squat pivot transfer with overall MinA and mod cues for technique. PT Short Term Goal 3 (Week 1): pt will perform stand pivot transfers using LRAD with MinA. PT Short Term Goal 4 (Week 1): Pt will initiate stair training. PT Short Term Goal 5 (Week 1): Pt will ambulate at least 50 ft using LRAD and overall MinA. Week 2:     Skilled Therapeutic Interventions/Progress Updates:  Patient seated upright in w/c on entrance to room. Wife present. Patient alert and agreeable to PT session.   Patient with no pain complaint at start of session.   Wife with a few questions re: progress, home environment including stairs and difficulty with property mgmt re: repair of deck/ front entrance. Has discussed with son and repairman re: needs of all new deck and stairs for safety. Some pushback/ delay in progress for at least repairs to handrails. Suggested LMN from MD/ team re: need. Pt did perform this morning and has definite need for use of Bil HRs. Therapeutic Activity:  Transfers: Pt performed sit<>stand and stand pivot transfers throughout session with CGA for balance. Provided verbal cues for improving technique and attaining stance to no UE support. Apprehensive initially re: R knee but is able to slowly reach upright posture  and maintain balance with CGA.  Gait Training/ NMR:  Pt ambulated 90 ft using RW with overall CGA. Prior to initiating ambulation, discussed with pt need for mental, conscious practice for improved foot placement bilaterally. Demonstrated improving ability to place each step at good length  and with improved coordination. Pt improves from intermittent advancement of walker to smooth advancement and increased pace in step progression. Maintains RW at good position in front of pt with vc/ tc provided for increasing lean to L side as well as for maintaining good proximity to walker. .  NMR performed for improvements in motor control and coordination, balance, sequencing, judgement, and self confidence/ efficacy in performing all aspects of mobility at highest level of independence.   Bed Mobility: Pt performed sit--> supine with supervision. Assisted pt in use of remote on bed for preferred bed positioning. VC/ tc required overall  for technique.  Patient supine in bed at end of session with brakes locked, bed alarm set, and all needs within reach.   Therapy Documentation Precautions:  Precautions Precautions: Fall Precaution Comments: R sided reduced coordination, R sided weakness and LLE weakness/reduced motor planning during ambulation Restrictions Weight Bearing Restrictions: No General:   Vital Signs:  Pain: Relates low back pain at end of session and addressed with change in position to lying in bed for pain mgmt.   Therapy/Group: Individual Therapy  Loel Dubonnet PT, DPT, CSRS 08/23/2022, 6:07 PM

## 2022-08-23 NOTE — Progress Notes (Addendum)
No BM in 5 days, requested laxative from PA. Gave Miralax and Sorbitol per order, medication effective.

## 2022-08-23 NOTE — Progress Notes (Signed)
PROGRESS NOTE   Subjective/Complaints: Continues to have confusion but feels that his hallucinations have become less frequent and intense.    Objective: ROS: cognitive impairment   No results found. Recent Labs    08/23/22 0725  WBC 8.4  HGB 14.2  HCT 40.5  PLT 185    Recent Labs    08/23/22 0725  NA 138  K 3.4*  CL 102  CO2 28  GLUCOSE 120*  BUN 11  CREATININE 0.97  CALCIUM 9.5     Intake/Output Summary (Last 24 hours) at 08/23/2022 1017 Last data filed at 08/23/2022 0752 Gross per 24 hour  Intake 240 ml  Output 1125 ml  Net -885 ml        Physical Exam: Vital Signs Blood pressure 102/64, pulse 74, temperature 98 F (36.7 C), resp. rate 18, height 5\' 6"  (1.676 m), weight 77 kg, SpO2 100 %. Gen: no distress, normal appearing HEENT: oral mucosa pink and moist, NCAT Cardio: Reg rate Chest: normal effort, normal rate of breathing Abd: soft, non-distended  Ext: no clubbing, cyanosis, or edema Psych: pleasant and cooperative  Skin: intact Neurologic: distracted. Oriented to self, month, year. Didn't know where he was. Cranial nerves II through XII intact, motor strength is 4/5 in bilateral deltoid, bicep, tricep, grip, 3- bilat hip flexor, knee extensors, ankle dorsiflexor and plantar flexor Sensory exam normal for light touch and pain in all 4 limbs. No limb ataxia or cerebellar signs. No abnormal tone appreciated.   musculoskeletal: Full range of motion in all 4 extremities. No joint swelling   Assessment/Plan: 1. Functional deficits which require 3+ hours per day of interdisciplinary therapy in a comprehensive inpatient rehab setting. Physiatrist is providing close team supervision and 24 hour management of active medical problems listed below. Physiatrist and rehab team continue to assess barriers to discharge/monitor patient progress toward functional and medical goals  Care Tool:  Bathing     Body parts bathed by patient: Right arm, Left arm, Chest, Abdomen, Front perineal area, Right upper leg, Left upper leg, Face, Buttocks, Left lower leg, Right lower leg   Body parts bathed by helper: Buttocks, Left lower leg, Right lower leg     Bathing assist Assist Level: Contact Guard/Touching assist     Upper Body Dressing/Undressing Upper body dressing   What is the patient wearing?: Pull over shirt    Upper body assist Assist Level: Set up assist    Lower Body Dressing/Undressing Lower body dressing      What is the patient wearing?: Underwear/pull up, Pants     Lower body assist Assist for lower body dressing: Contact Guard/Touching assist     Toileting Toileting    Toileting assist Assist for toileting: Minimal Assistance - Patient > 75% Assistive Device Comment: walker and commode over toilet   Transfers Chair/bed transfer  Transfers assist     Chair/bed transfer assist level: Minimal Assistance - Patient > 75%     Locomotion Ambulation   Ambulation assist      Assist level: Minimal Assistance - Patient > 75% Assistive device: Walker-rolling Max distance: 80   Walk 10 feet activity   Assist     Assist  level: Minimal Assistance - Patient > 75% Assistive device: Walker-rolling   Walk 50 feet activity   Assist Walk 50 feet with 2 turns activity did not occur: Safety/medical concerns  Assist level: Minimal Assistance - Patient > 75% Assistive device: Walker-rolling    Walk 150 feet activity   Assist Walk 150 feet activity did not occur: Safety/medical concerns         Walk 10 feet on uneven surface  activity   Assist Walk 10 feet on uneven surfaces activity did not occur: Safety/medical concerns         Wheelchair     Assist Is the patient using a wheelchair?: Yes Type of Wheelchair: Manual    Wheelchair assist level: Dependent - Patient 0% Max wheelchair distance: 300 ft    Wheelchair 50 feet with 2 turns  activity    Assist        Assist Level: Dependent - Patient 0%   Wheelchair 150 feet activity     Assist      Assist Level: Dependent - Patient 0%   Blood pressure 102/64, pulse 74, temperature 98 F (36.7 C), resp. rate 18, height 5\' 6"  (1.676 m), weight 77 kg, SpO2 100 %.    Medical Problem List and Plan: 1. Functional deficits secondary to left pontine and bilateral thalamic, parietal embolic strokes             -patient may shower             -ELOS/Goals: 10 to 14 days, supervision goals PT/OT/SLP   Grounds pass ordered  -Continue CIR therapies including PT, OT, and SLP   2.  Atrial fibrillation: continue Eliquis             -antiplatelet therapy: aspirin    3. Knee pain: Tylenol, Robaxin as needed. Continue knee sleeve.    4. Mood/Behavior/Sleep: LCSW to evaluate and provide emotional support             -continue Lexapro             -antipsychotic agents: n/a   -6/2 night time restlessness and agitation this weekend   -UCX negative   -increase seroquel to 50mg  at 2000   -NEED to keep sleep chart 5. Neuropsych/cognition: This patient is not capable of making decisions on his own behalf.   6. Skin/Wound Care: Routine skin care checks   7. Fluids/Electrolytes/Nutrition/aphasia: Routine Is and Os and follow-up chemistries -On modified diet    8: Hypertension: monitor TID and prn; at home on tenoretic             -decrease atenolol to 12.5mg  daily.    6/2 bp still elevated this morning, increase atenolol to 25mg  daily 9: Hyperlipidemia: continue statin, Zetia   10: CAD s/p DES, 2018, 2019             -continue aspirin and statin, Zetia, BB   11: pAF: continue Eliquis and BB   12: DM-2: at home on metformin 1000 mg BID, Humalog 75/25 40 units, Ozempic             -continue SSI, monitor intake, CBGs QID             -Add carb controlled to dysphagia diet  Add acarbose 25mg  with meals  Increase magnesium gluconate 500g HS CBG (last 3)  Recent Labs  (last 2 labs)       Recent Labs    08/13/22 2129 08/14/22 0631 08/14/22 1206  GLUCAP 288* 246*  252*    Is not on any hypoglycemic agents, will restart metformin 1000 mg twice daily and monitor.  6/1 cbg's remain poorly controlled. Only on metformin  -begin 70/30 insulin bid at 6 units 6/2 increase to 10u bid 13: Tobacco use: cessation counseling; continue Nicoderm   14: Hypothyroidism: continue Synthroid   15: Overweight: dietary/exercise counseling, increase magneisum gluconate to 500mg  HS   16: Left foot pain: possible non-displaced fracture vs. osseous remolding 5th toe             -continue Darco shoe, buddy wrap  17. Bradycardia: d/c atenolol  18. Hypokalemia: ordered klor once on 5/27, and on 6/3  19. Dizziness: d/c atenolol  20. Right knee buckling/chronic pain: knee sleeve ordered, Zynex Nexwave ordered for outpatient use  22. Nodule on anterior chest wall: nontender, discussed may be lipoma    LOS: 10 days A FACE TO FACE EVALUATION WAS PERFORMED  Clint Bolder P Vernal Hritz 08/23/2022, 10:17 AM

## 2022-08-23 NOTE — Progress Notes (Signed)
Speech Language Pathology Weekly Progress and Session Note  Patient Details  Name: Anthony Woods MRN: [id number] Date of Birth: 06-Jul-1955  Beginning of progress report period: Aug 15, 2022 End of progress report period: August 23, 2022  Today's Date: 08/23/2022 SLP Individual Time: 1430-1530 SLP Individual Time Calculation (min): 60 min  Short Term Goals: Week 1: SLP Short Term Goal 1 (Week 1): Patient will consume current diet with supervision level verbal cues for use of swallowing compensatory strategies. SLP Short Term Goal 1 - Progress (Week 1): Met SLP Short Term Goal 2 (Week 1): Patient will utilize speech intelligibility strategies at the sentence level to achieve 90% intelligibility with Min verbal cues. SLP Short Term Goal 2 - Progress (Week 1): Progressing toward goal SLP Short Term Goal 3 (Week 1): Patient will demonstrate selective attention for 30 mminutes to functional tasks in a mildly distracting enviornment with Min verbal cues for redirection. SLP Short Term Goal 3 - Progress (Week 1): Progressing toward goal SLP Short Term Goal 4 (Week 1): Patient will demonstrate functional problem solving for mildly complex tasks with Min verbal cues for redirection. SLP Short Term Goal 4 - Progress (Week 1): Met SLP Short Term Goal 5 (Week 1): Patient will recall new, daily information with Mod A multimodal cues for use of compensatory strategies. SLP Short Term Goal 5 - Progress (Week 1): Progressing toward goal    New Short Term Goals: Week 2: SLP Short Term Goal 1 (Week 2): STGs= LTGs due to ELOS  Weekly Progress Updates: Pt has made steady gains and has met 2 out of 5 STGS this reporting period due to tolerance of current diet and functional problem solving. Currently, pt continues to require min to mod A for speech intelligibility, recall of functional and novel information, and orientation. Pt/ fmaily education ongoing. Pt would beneift from continued skilled SLP intervention to  maximize his functional independence prior to discharge.   Intensity: Minumum of 1-2 x/day, 30 to 90 minutes Frequency: 1 to 3 out of 7 days Duration/Length of Stay: June 11 Treatment/Interventions: Cognitive remediation/compensation;Cueing hierarchy;Dysphagia/aspiration precaution training;Environmental controls;Functional tasks;Internal/external aids;Patient/family education;Therapeutic Activities;Speech/Language facilitation   Daily Session  Skilled Therapeutic Interventions: Pt was seen in PM to address cognitive re- training. Pt was alert and seated upright in WC upon SLP arrival with his wife present and participating intermittently throughout. SLP addressed recall, orientation, and speech intelligibility this session. Pt challenged to recall functional information given guidance in use of repetition and association strategies he recalled date of hospitalization, therapist name, and disciplines with ~80- 100% intelligibility in multiple opportunities across session. He identified temporal concepts with 100% acc given external visual aid. Pt was ~75% intelligible in sentences and conversation warranting min A for over articulation and slow rate. Pt was left seated upright in WC with call button within reach, chair alarm active, and wife present. SLP to continue POC.    General    Pain Pain Assessment Pain Scale: 0-10 Pain Score: 0-No pain  Therapy/Group: Individual Therapy  Renaee Munda 08/23/2022, 4:49 PM

## 2022-08-23 NOTE — Progress Notes (Signed)
Physical Therapy Session Note  Patient Details  Name: Anthony Woods MRN: [id number] Date of Birth: 12-31-1955  Today's Date: 08/23/2022 PT Individual Time: 0900-0958 PT Individual Time Calculation (min): 58 min   Short Term Goals: Week 1:  PT Short Term Goal 1 (Week 1): Pt will perform all bed mobility from flat bed surface with consistent SUP. PT Short Term Goal 2 (Week 1): Pt will perform squat pivot transfer with overall MinA and mod cues for technique. PT Short Term Goal 3 (Week 1): pt will perform stand pivot transfers using LRAD with MinA. PT Short Term Goal 4 (Week 1): Pt will initiate stair training. PT Short Term Goal 5 (Week 1): Pt will ambulate at least 50 ft using LRAD and overall MinA.   Skilled Therapeutic Interventions/Progress Updates:      Pt in bed with legs off the edge, rails up. Patient reports feeling "confused" - oriented to self, location, situation, year. Disoriented to month and date. MD entering room for rounding and discussed confusion with her.   Supine<>Sitting EOB with CGA. Patient donned t-shirt with setupA. Stand pivot transfer with minA and no AD to w/c - once in w/c, patient reporting need to void. Ambulated to the bathroom with minA and RW, ~62ft - able to lower pants in standing with RW support but no assist. Continent of bladder once sitting. Returned to w/c in similar manner and then transported to main rehab gym.  Pt don's R knee neoprene knee sleeve and shoes without assist.  Gait training 149ft + 153ft with CGA/minA and RW - inconsistent step length bilaterally, toe drag on R > L, drifts away from his RW, and moderate reliance of UE support through RW. Gait speed decreased, purposeful.   Initiated stair training with 6inch steps and 2 hand rails to simulate home environment. Educated on sequencing (leading with L foot for ascent and R with descent). Patient navigated up/down x8 steps with CGA/minA with x1 instance of R knee buckling when descending  the stairs - self corrected. Pt reports his hand rails aren't reliable at home, "wobbly." Worked on reducing "pull" from UE 's while going up the steps. Will benefit from further practice.  Pt propelled himself in w/c with supervision from main rehab gym to his room, ~130ft, with directional cues for locating room #. Ended session seated in w/c with safety belt alarm on, call bell in lap, all needs met.     Therapy Documentation Precautions:  Precautions Precautions: Fall Precaution Comments: R sided reduced coordination, R sided weakness and LLE weakness/reduced motor planning during ambulation Restrictions Weight Bearing Restrictions: No General:      Therapy/Group: Individual Therapy  Mads Borgmeyer P Ariadne Rissmiller  PT, DPT, CSRS  08/23/2022, 9:17 AM

## 2022-08-23 NOTE — Progress Notes (Signed)
Occupational Therapy Session Note  Patient Details  Name: Anthony Woods MRN: [id number] Date of Birth: Jun 08, 1955  Today's Date: 08/23/2022 OT Individual Time: 1050-1115 OT Individual Time Calculation (min): 25 min    Short Term Goals: Week 2:  OT Short Term Goal 1 (Week 2): Patient to perform toileting with SBA for safety using grab bars OT Short Term Goal 2 (Week 2): Patient to perform LB dressing with set-up and SBA for safety utilizing DME PRN OT Short Term Goal 3 (Week 2): Patient to perform bathing with SBA for safety cues and DME PRN OT Short Term Goal 4 (Week 2): Patient to display improved BUE coordination by opening containers on meal tray with vc's as needed  Skilled Therapeutic Interventions/Progress Updates:    Pt received sitting in the w/c with no c/o pain. Pt was taken via w/c to the therapy gym for time management. He completed 100 ft of functional mobility with B HHA, mod A overall with cueing for increasing BOS, LLE scissoring especially, and upright posture with forward gaze. Pt completed blocked practice sit <> stand, 2x8 repetitions with no UE support, to challenge generalized strengthening for ADL transfers, as well as increasing functional activity tolerance and cardiorespiratory endurance. Pt required CGA and cueing for forward weight shift. He returned to his room and was left sitting up with all needs met, chair alarm set.    Therapy Documentation Precautions:  Precautions Precautions: Fall Precaution Comments: R sided reduced coordination, R sided weakness and LLE weakness/reduced motor planning during ambulation Restrictions Weight Bearing Restrictions: No  Therapy/Group: Individual Therapy  Crissie Reese 08/23/2022, 11:04 AM

## 2022-08-24 DIAGNOSIS — I639 Cerebral infarction, unspecified: Secondary | ICD-10-CM | POA: Diagnosis not present

## 2022-08-24 LAB — GLUCOSE, CAPILLARY
Glucose-Capillary: 138 mg/dL — ABNORMAL HIGH (ref 70–99)
Glucose-Capillary: 200 mg/dL — ABNORMAL HIGH (ref 70–99)
Glucose-Capillary: 142 mg/dL — ABNORMAL HIGH (ref 70–99)
Glucose-Capillary: 62 mg/dL — ABNORMAL LOW (ref 70–99)
Glucose-Capillary: 69 mg/dL — ABNORMAL LOW (ref 70–99)
Glucose-Capillary: 114 mg/dL — ABNORMAL HIGH (ref 70–99)

## 2022-08-24 MED ORDER — ACARBOSE 25 MG PO TABS
50.0000 mg | ORAL_TABLET | Freq: Three times a day (TID) | ORAL | Status: DC
  Administered 2022-08-24 – 2022-09-03 (×30): 50 mg via ORAL
  Filled 2022-08-24 (×33): qty 2

## 2022-08-24 MED ORDER — METOPROLOL SUCCINATE ER 25 MG PO TB24
12.5000 mg | ORAL_TABLET | Freq: Every day | ORAL | Status: DC
  Administered 2022-08-24 – 2022-09-03 (×11): 12.5 mg via ORAL
  Filled 2022-08-24 (×11): qty 1

## 2022-08-24 MED ORDER — TRAZODONE HCL 50 MG PO TABS
50.0000 mg | ORAL_TABLET | Freq: Every day | ORAL | Status: DC
  Administered 2022-08-24: 50 mg via ORAL
  Filled 2022-08-24: qty 1

## 2022-08-24 NOTE — Progress Notes (Signed)
Occupational Therapy Session Note  Patient Details  Name: Anthony Woods MRN: [id number] Date of Birth: February 14, 1956  Today's Date: 08/24/2022 OT Individual Time: 1133-1203 OT Individual Time Calculation (min): 30 min    Short Term Goals: Week 2:  OT Short Term Goal 1 (Week 2): Patient to perform toileting with SBA for safety using grab bars OT Short Term Goal 2 (Week 2): Patient to perform LB dressing with set-up and SBA for safety utilizing DME PRN OT Short Term Goal 3 (Week 2): Patient to perform bathing with SBA for safety cues and DME PRN OT Short Term Goal 4 (Week 2): Patient to display improved BUE coordination by opening containers on meal tray with vc's as needed  Skilled Therapeutic Interventions/Progress Updates:     Therapy Documentation Precautions:  Precautions Precautions: Fall Precaution Comments: R sided reduced coordination, R sided weakness and LLE weakness/reduced motor planning during ambulation Restrictions Weight Bearing Restrictions: No General: "I can't remember things sometimes" Pt seated in recliner upon OT arrival with 2 family members present, agreeable to OT.   Pain: 0/10 pain reported/noted  FMC/dexterity exercises: -9 hole peg test, out of normative range, would benefit from increased [facility] activities  -Rt hand: 1 min 05 sec  -Lt hand: 1 min 19 sec ([place] for healthy males ages 59-70+): 66-70 R 21.23 L 22.29  - in hand manipulation: Pt completed in hand manipulation activity with coins in order to increase dexterity for increased independence with manipulating containers. Pt completed 2 trials of exercise -1st trial: retrieving coins with Lt hand in front of pt on table and placing them in cup  -2nd trial: retrieving coins from table with Lt hand, placing in Rt hand and performing palm to finger translation, VC for correct form and direction to task, improvements with increased trials     Pt seated in W/C at end of session with W/C alarm donned,  call light within reach and 4Ps assessed.    Therapy/Group: Individual Therapy  Melvyn Novas, MS, OTR/L  08/24/2022, 4:03 PM

## 2022-08-24 NOTE — Progress Notes (Signed)
Speech Language Pathology Daily Session Note  Patient Details  Name: Anthony Woods MRN: [id number] Date of Birth: 1955-05-09  Today's Date: 08/24/2022 SLP Individual Time: 1030-1115 SLP Individual Time Calculation (min): 45 min  Short Term Goals: Week 2: SLP Short Term Goal 1 (Week 2): STGs= LTGs due to ELOS  Skilled Therapeutic Interventions:  Pt was seen in PM to address cognitive re- training, speech intelligibility, and dysphagia management. Pt was alert and seated upright in WC upon SLP arrival. Upon SLP arrival, pt was oriented to month, day of week, year, and situation. Pt also recalled this therapist name indep. Pt requested to use the bathroom with sup to min A for safety awareness which included locking WC brakes and sitting before feeling WC behind his knees. Pt was continent of bladder. Pt was challenged in speech intelligibility. SLP reviewed strategies of over articulation and separating words through structured task. Pt was challenged to communicate information in order to get across a specific message. Pt completed task with ~80% intelligibility in phrases with min A for over articulation, amplifying, and separating words. Pt with poor carryover of strategies outside of structured task. In additional minutes of session, SLP presented regular solid trials. Pt consumed meal with sup to min A for oral clearance and liquid wash. Pt with no s/sx pen/asp. Pt was left seated upright in WC with call button within reach and chair alarm active. SLP to continue POC.   Pain Pain Assessment Pain Scale: 0-10 Pain Score: 0-No pain  Therapy/Group: Individual Therapy  Renaee Munda 08/24/2022, 2:08 PM

## 2022-08-24 NOTE — Progress Notes (Signed)
Occupational Therapy Session Note  Patient Details  Name: Anthony Woods MRN: [id number] Date of Birth: 12-Apr-1955  Today's Date: 08/24/2022 OT Individual Time: 1133-1203 OT Individual Time Calculation (min): 30 min    Short Term Goals: Week 2:  OT Short Term Goal 1 (Week 2): Patient to perform toileting with SBA for safety using grab bars OT Short Term Goal 2 (Week 2): Patient to perform LB dressing with set-up and SBA for safety utilizing DME PRN OT Short Term Goal 3 (Week 2): Patient to perform bathing with SBA for safety cues and DME PRN OT Short Term Goal 4 (Week 2): Patient to display improved BUE coordination by opening containers on meal tray with vc's as needed  Skilled Therapeutic Interventions/Progress Updates:     Pt received siting up in wc presenting to be in good spirits receptive to skilled OT session reporting 0/10 pain- OT offering intermittent rest breaks, repositioning, and therapeutic support to optimize participation in therapy session. Focus this session activity tolerance and BUE coordination. Pt propelled wc ~150 ft to therapy gym with supervision for endurance and functional mobility training with single rest break required during task. Pt completed stand step with mod HHA with tactile cues provided for weight shifting and balance wc>EOM. Sitting EOM, engaged Pt in BUE FM and VM coordination activity using large to small sized screws and bolts facilitating twisting and untwisting movements to increase UE strength for BADLs. Pt able to don/doff bolts/screws in wooden board with supervision with min verbal cues required to avoid using compensatory technique. Stand step EOM>wc min HHA with tactile cueing for weight shifting. Pt propelled wc back to room with supervision and increased time. Pt was left resting in wc with call bell in reach, eat belt alarm on, and all needs met.    Therapy Documentation Precautions:  Precautions Precautions: Fall Precaution Comments: R  sided reduced coordination, R sided weakness and LLE weakness/reduced motor planning during ambulation Restrictions Weight Bearing Restrictions: No  Therapy/Group: Individual Therapy  Army Fossa 08/24/2022, 7:56 AM

## 2022-08-24 NOTE — Progress Notes (Signed)
PROGRESS NOTE   Subjective/Complaints: + insomnia- scheduled trazodone at night since this has been helpful to him +tachycardia and HTN: added toprol XL HS, already on magnesium   Objective: ROS: cognitive impairment, +insomnia   No results found. Recent Labs    08/23/22 0725  WBC 8.4  HGB 14.2  HCT 40.5  PLT 185    Recent Labs    08/23/22 0725  NA 138  K 3.4*  CL 102  CO2 28  GLUCOSE 120*  BUN 11  CREATININE 0.97  CALCIUM 9.5     Intake/Output Summary (Last 24 hours) at 08/24/2022 1143 Last data filed at 08/24/2022 1610 Gross per 24 hour  Intake 716 ml  Output 1575 ml  Net -859 ml        Physical Exam: Vital Signs Blood pressure (!) 142/88, pulse (!) 105, temperature 97.8 F (36.6 C), temperature source Oral, resp. rate 18, height 5\' 6"  (1.676 m), weight 77 kg, SpO2 98 %. Gen: no distress, normal appearing HEENT: oral mucosa pink and moist, NCAT Cardio: Tachycardia Chest: normal effort, normal rate of breathing Abd: soft, non-distended  Ext: no clubbing, cyanosis, or edema Psych: pleasant and cooperative  Skin: intact Neurologic: distracted. Oriented to self, month, year. Didn't know where he was. Cranial nerves II through XII intact, motor strength is 4/5 in bilateral deltoid, bicep, tricep, grip, 3- bilat hip flexor, knee extensors, ankle dorsiflexor and plantar flexor Sensory exam normal for light touch and pain in all 4 limbs. No limb ataxia or cerebellar signs. No abnormal tone appreciated.   musculoskeletal: Full range of motion in all 4 extremities. No joint swelling   Assessment/Plan: 1. Functional deficits which require 3+ hours per day of interdisciplinary therapy in a comprehensive inpatient rehab setting. Physiatrist is providing close team supervision and 24 hour management of active medical problems listed below. Physiatrist and rehab team continue to assess barriers to  discharge/monitor patient progress toward functional and medical goals  Care Tool:  Bathing    Body parts bathed by patient: Right arm, Left arm, Chest, Abdomen, Front perineal area, Right upper leg, Left upper leg, Face, Buttocks, Left lower leg, Right lower leg   Body parts bathed by helper: Buttocks, Left lower leg, Right lower leg     Bathing assist Assist Level: Contact Guard/Touching assist     Upper Body Dressing/Undressing Upper body dressing   What is the patient wearing?: Pull over shirt    Upper body assist Assist Level: Set up assist    Lower Body Dressing/Undressing Lower body dressing      What is the patient wearing?: Underwear/pull up, Pants     Lower body assist Assist for lower body dressing: Contact Guard/Touching assist     Toileting Toileting    Toileting assist Assist for toileting: Minimal Assistance - Patient > 75% Assistive Device Comment: walker and commode over toilet   Transfers Chair/bed transfer  Transfers assist     Chair/bed transfer assist level: Minimal Assistance - Patient > 75%     Locomotion Ambulation   Ambulation assist      Assist level: Minimal Assistance - Patient > 75% Assistive device: Walker-rolling Max distance: 80  Walk 10 feet activity   Assist     Assist level: Minimal Assistance - Patient > 75% Assistive device: Walker-rolling   Walk 50 feet activity   Assist Walk 50 feet with 2 turns activity did not occur: Safety/medical concerns  Assist level: Minimal Assistance - Patient > 75% Assistive device: Walker-rolling    Walk 150 feet activity   Assist Walk 150 feet activity did not occur: Safety/medical concerns         Walk 10 feet on uneven surface  activity   Assist Walk 10 feet on uneven surfaces activity did not occur: Safety/medical concerns         Wheelchair     Assist Is the patient using a wheelchair?: Yes Type of Wheelchair: Manual    Wheelchair assist  level: Dependent - Patient 0% Max wheelchair distance: 300 ft    Wheelchair 50 feet with 2 turns activity    Assist        Assist Level: Dependent - Patient 0%   Wheelchair 150 feet activity     Assist      Assist Level: Dependent - Patient 0%   Blood pressure (!) 142/88, pulse (!) 105, temperature 97.8 F (36.6 C), temperature source Oral, resp. rate 18, height 5\' 6"  (1.676 m), weight 77 kg, SpO2 98 %.    Medical Problem List and Plan: 1. Functional deficits secondary to left pontine and bilateral thalamic, parietal embolic strokes             -patient may shower             -ELOS/Goals: 10 to 14 days, supervision goals PT/OT/SLP   Grounds pass ordered  -Continue CIR therapies including PT, OT, and SLP   2.  Atrial fibrillation: continue Eliquis             -antiplatelet therapy: aspirin    3. Knee pain: Tylenol, Robaxin as needed. Continue knee sleeve.    4. Mood/Behavior/Sleep: LCSW to evaluate and provide emotional support             -continue Lexapro             -antipsychotic agents: n/a   -6/2 night time restlessness and agitation this weekend   -UCX negative   -increase seroquel to 50mg  at 2000   -NEED to keep sleep chart 5. Neuropsych/cognition: This patient is not capable of making decisions on his own behalf.   6. Skin/Wound Care: Routine skin care checks   7. Fluids/Electrolytes/Nutrition/aphasia: Routine Is and Os and follow-up chemistries -On modified diet    8: Hypertension: monitor TID and prn; at home on tenoretic             -decrease atenolol to 12.5mg  daily.    6/2 bp still elevated this morning, increase atenolol to 25mg  daily 9: Hyperlipidemia: continue statin, Zetia   10: CAD s/p DES, 2018, 2019             -continue aspirin and statin, Zetia, BB   11: pAF: continue Eliquis and BB   12: DM-2: at home on metformin 1000 mg BID, Humalog 75/25 40 units, Ozempic             -continue SSI, monitor intake, CBGs QID             -Add  carb controlled to dysphagia diet  Increase acarbose to 50mg  TID  Increase magnesium gluconate 500g HS CBG (last 3)  Recent Labs (last 2 labs)  Recent Labs    08/13/22 2129 08/14/22 0631 08/14/22 1206  GLUCAP 288* 246* 252*    Is not on any hypoglycemic agents, will restart metformin 1000 mg twice daily and monitor.  6/1 cbg's remain poorly controlled. Only on metformin  -begin 70/30 insulin bid at 6 units 6/2 increase to 10u bid 13: Tobacco use: cessation counseling; continue Nicoderm   14: Hypothyroidism: continue Synthroid   15: Overweight: dietary/exercise counseling, increase magneisum gluconate to 500mg  HS   16: Left foot pain: possible non-displaced fracture vs. osseous remolding 5th toe             -continue Darco shoe, buddy wrap  17. Bradycardia: d/c atenolol  18. Hypokalemia: ordered klor once on 5/27, and on 6/3  19. Dizziness: d/c atenolol  20. Right knee buckling/chronic pain: knee sleeve ordered, Zynex Nexwave ordered for outpatient use  22. Nodule on anterior chest wall: nontender, discussed may be lipoma  23. Tachycardia: add toprol XL HS  24. Insomnia: add trazodone 50mg  HS  25. HTN: add toprol 12.5mg  XL HS    LOS: 11 days A FACE TO FACE EVALUATION WAS PERFORMED  Anthony Woods P Shalandra Leu 08/24/2022, 11:43 AM

## 2022-08-24 NOTE — Progress Notes (Signed)
Hypoglycemic Event  CBG: 69  Treatment: 8 oz juice/soda  Symptoms: None  Follow-up CBG: Time:2139 CBG Result:114  Possible Reasons for Event: Unknown  Comments/MD notified:Charge nurse made aware    Thereasa Parkin

## 2022-08-24 NOTE — Progress Notes (Signed)
Physical Therapy Weekly Progress Note  Patient Details  Name: Anthony Woods MRN: [id number] Date of Birth: 11-Jul-1955  Beginning of progress report period: Aug 14, 2022 End of progress report period: August 24, 2022  Today's Date: 08/24/2022 PT Individual Time: 0802-0900 PT Individual Time Calculation (min): 58 min   Patient has met 5 of 5 short term goals.  Mr. Slavey is making appropriate progress towards LTG of CGA/supervision. He currently requires supervision for bed mobility, CGA for stand pivot transfers with RW, and CGA for gait ~199ft (minA for gait >178ft) with RW. He is primarily limited by R sided weakness, R inattention (mild), and cognitive impairments.   Patient continues to demonstrate the following deficits muscle weakness and muscle joint tightness, decreased cardiorespiratoy endurance, decreased attention, decreased awareness, decreased problem solving, decreased safety awareness, and decreased memory, and decreased standing balance, decreased postural control, hemiplegia, and decreased balance strategies and therefore will continue to benefit from skilled PT intervention to increase functional independence with mobility.  Patient progressing toward long term goals..  Continue plan of care.  PT Short Term Goals Week 1:  PT Short Term Goal 1 (Week 1): Pt will perform all bed mobility from flat bed surface with consistent SUP. PT Short Term Goal 1 - Progress (Week 1): Met PT Short Term Goal 2 (Week 1): Pt will perform squat pivot transfer with overall MinA and mod cues for technique. PT Short Term Goal 2 - Progress (Week 1): Met PT Short Term Goal 3 (Week 1): pt will perform stand pivot transfers using LRAD with MinA. PT Short Term Goal 3 - Progress (Week 1): Met PT Short Term Goal 4 (Week 1): Pt will initiate stair training. PT Short Term Goal 4 - Progress (Week 1): Met PT Short Term Goal 5 (Week 1): Pt will ambulate at least 50 ft using LRAD and overall MinA. PT Short Term  Goal 5 - Progress (Week 1): Met Week 2:  PT Short Term Goal 1 (Week 2): STG = LTG due to ELOS  Skilled Therapeutic Interventions/Progress Updates:      Pt in bed to start - reports spilling his urinal and needing assistance changing his dirty brief.   Supine<>sit with supervision with hospital bed features. Sit<>stand to RW with CGA and cues for hand placement. Ambulates ~43ft within his room with CGA and RW to the bathroom - gait mildly ataxic with scissoring and purposeful gait pattern. Patient sitting on BSC to have small BM - charted. Able to wipe his backside but needs assist for thoroughness with wet cloth.  Ambulates with CGA and RW in his room to the sink to complete a few self care tasks - hand washing, oral care, and applying deodorant - working on bimanual functional tasks in standing. Worked on retrieving clothes from his dresser - needing minA for balance as he leans against dresser with wheels that weren't locked, assist for LOB recovery needed.   Pt dressed himself while sitting in w/c - setupA for both UB and LB dressing. Able to don his neoprene knee sleeve on his R knee with setupA as well.  Pt propelled himself in w/c ~171ft to main rehab gym with supervision, using BUE to propel and demonstrated adequate speed and stroke efficiency.   Gait training ~134ft with CGA to start, fading to minA for last ~93ft due to fatigue - using RW for AD. Worked on upright posture, forward gaze, awareness of foot placement, and increasing reciprocal/continuous gait pattern with RW.   Assisted onto  Nustep and completed 10 minutes at L5.0 resistance, cadence ~40 steps minute, achieiving 364 steps in total.   Returned to his room and ended session seated in w/c with safety belt alarm on and call bell in lap. Informed of next therapy session.  Therapy Documentation Precautions:  Precautions Precautions: Fall Precaution Comments: R sided reduced coordination, R sided weakness and LLE  weakness/reduced motor planning during ambulation Restrictions Weight Bearing Restrictions: No General:   Therapy/Group: Individual Therapy  Haevyn Ury P Fredricka Kohrs  PT, DPT, CSRS  08/24/2022, 7:57 AM

## 2022-08-24 NOTE — Progress Notes (Signed)
Hypoglycemic Event  CBG: 62  Treatment: 4 oz juice/soda  Symptoms: None  Follow-up CBG: Time:2119 CBG Result:69  Possible Reasons for Event: Unknown  Comments/MD notified:Charge nurse made aware    Thereasa Parkin

## 2022-08-24 NOTE — Progress Notes (Signed)
Physical Therapy Session Note  Patient Details  Name: Anthony Woods MRN: [id number] Date of Birth: Jun 28, 1955  Today's Date: 08/24/2022 PT Individual Time: 0920-1018 PT Individual Time Calculation (min): 58 min   Short Term Goals: Week 1:  PT Short Term Goal 1 (Week 1): Pt will perform all bed mobility from flat bed surface with consistent SUP. PT Short Term Goal 1 - Progress (Week 1): Met PT Short Term Goal 2 (Week 1): Pt will perform squat pivot transfer with overall MinA and mod cues for technique. PT Short Term Goal 2 - Progress (Week 1): Met PT Short Term Goal 3 (Week 1): pt will perform stand pivot transfers using LRAD with MinA. PT Short Term Goal 3 - Progress (Week 1): Met PT Short Term Goal 4 (Week 1): Pt will initiate stair training. PT Short Term Goal 4 - Progress (Week 1): Met PT Short Term Goal 5 (Week 1): Pt will ambulate at least 50 ft using LRAD and overall MinA. PT Short Term Goal 5 - Progress (Week 1): Met   Skilled Therapeutic Interventions/Progress Updates: Patient in [facility] on entrance to room. Patient alert and agreeable to PT session.  Patient reported no pain at beginning of PT session.  Therapeutic Activity: Transfers: Pt performed sit<>stand transfers throughout session with CGA in RW. Provided VC for anterior scoot and to push with one UE, and the other on RW per presentation of either using B UE on RW, or attempting to sit with RW not in safe proximity.  Gait Training:  Pt ambulated 62' x 1, 40'  x 1, 115' using RW with CGA and WC follow throughout. Patient presented with decreased B foot clearance, foot drop in L LE, narrow BOS and downward gaze. Patient provided with cues to increase B foot clearance and increase BOS, as well as looking ahead. Patient with tape on L foot the last 2 gait trials in order to increase L LE foot clearance. Patient performed task with min cues (until the last 50 feet or so 2/2 reports of increased fatigue). Patient also provided with  VC for upright posture per forward flexed presentation. Patient has demonstrated a progression vs previous sessions working with this PTA per objective findings.   Neuromuscular Re-ed: NMR facilitated during session with focus on proprioceptive feedback on B LE, motor coordination, weight shifting, and stepping strategy. - Bean bag toss to cones on floor in order to challenge dynamic balance, stepping strategies, and musculature coordination. Patient provided with demonstration and instructional VC to forward step with L LE, and to step back to neutral BOS (tape on ground for visual cue). Patient required min-modA (mostly to help maintain balance for safety), and 2 moments of maxA for LOB. Patient demonstrated appropriate stepping strategies, and also presented with excessive truncal flexion leading to anterior shift in COG (causing LOB). Patient provided with rest breaks throughout intervention.   NMR performed for improvements in motor control and coordination, balance, sequencing, judgement, and self confidence/ efficacy in performing all aspects of mobility at highest level of independence.   Patient in [facility] at end of session with brakes locked, belt alarm set, and all needs within reach.      Therapy Documentation Precautions:  Precautions Precautions: Fall Precaution Comments: R sided reduced coordination, R sided weakness and LLE weakness/reduced motor planning during ambulation Restrictions Weight Bearing Restrictions: No  Therapy/Group: Individual Therapy  Zollie Clemence PTA 08/24/2022, 12:46 PM

## 2022-08-25 DIAGNOSIS — F0670 Mild neurocognitive disorder due to known physiological condition without behavioral disturbance: Secondary | ICD-10-CM | POA: Diagnosis not present

## 2022-08-25 DIAGNOSIS — I639 Cerebral infarction, unspecified: Secondary | ICD-10-CM | POA: Diagnosis not present

## 2022-08-25 LAB — GLUCOSE, CAPILLARY
Glucose-Capillary: 105 mg/dL — ABNORMAL HIGH (ref 70–99)
Glucose-Capillary: 136 mg/dL — ABNORMAL HIGH (ref 70–99)
Glucose-Capillary: 173 mg/dL — ABNORMAL HIGH (ref 70–99)
Glucose-Capillary: 151 mg/dL — ABNORMAL HIGH (ref 70–99)

## 2022-08-25 MED ORDER — TRAZODONE HCL 50 MG PO TABS
75.0000 mg | ORAL_TABLET | Freq: Every day | ORAL | Status: DC
  Administered 2022-08-25 – 2022-09-02 (×9): 75 mg via ORAL
  Filled 2022-08-25 (×9): qty 2

## 2022-08-25 NOTE — Progress Notes (Signed)
Physical Therapy Session Note  Patient Details  Name: Anthony Woods MRN: [id number] Date of Birth: May 03, 1955  Today's Date: 08/25/2022 PT Individual Time: 1415-1530 PT Individual Time Calculation (min): 75 min   Short Term Goals: Week 2:  PT Short Term Goal 1 (Week 2): STG = LTG due to ELOS  Skilled Therapeutic Interventions/Progress Updates:      Pt sitting in w/c to start with family members present but leave on PT entrance. Patient denies pain and agreeable to therpay session. Propels himself in w/c with BUE at supervision level 143ft from his room to main rehab gym. Transported remaining distance outdoors, near [facility]. Worked on gait training outdoors in unfamiliar environment with unlevel, paved surfaces. Patient ambulates at [facility] level with RW as AD but needs minA as he begins to fatigue after ~12ft. Decreased safety awareness with managing cracks/spaces in pavement and needs frequent cues for keeping body within walker frame as he tends to drift away from it as he fatigues. L foot internally rotated and at times "trips" himself with this narrow BOS and pigeon-toed posture. Downward gaze and flexed trunk throughout gait cycle. Stair training on CIR floor using 6inch steps and 2 hand rails - navigated x12 steps with CGA with 2 hand rails - varying b/w step-to vs reciprocal stepping for both ascent and descent. Some mild buckling of RLE during descent but nothing he couldn't manage. Dynamic standing balance while instructed in tossing cornhole bags in unsupported standing x3 games with sitting rest breaks b/w games. Increased instability while reaching/throwing outside BOS poor ankle/hip strategies to correct. Returned to his room and concluded session seated in w/c with family at the bedside who was updated on pt's mobility. All needs met with safety belt alarm on.   Therapy Documentation Precautions:  Precautions Precautions: Fall Precaution Comments: R sided reduced coordination, R sided  weakness and LLE weakness/reduced motor planning during ambulation Restrictions Weight Bearing Restrictions: No General:      Therapy/Group: Individual Therapy  Orrin Brigham 08/25/2022, 7:38 AM

## 2022-08-25 NOTE — Progress Notes (Signed)
Patients nicotine patch placed on left shoulder.

## 2022-08-25 NOTE — Progress Notes (Signed)
Physical Therapy Session Note  Patient Details  Name: Anthony Woods MRN: [id number] Date of Birth: [birth year]  Today's Date: 08/25/2022 PT Individual Time: 1003-1059 PT Individual Time Calculation (min): 56 min   Short Term Goals: Week 2:  PT Short Term Goal 1 (Week 2): STG = LTG due to ELOS  Skilled Therapeutic Interventions/Progress Updates:     Pt received seated in [facility] and agrees to therapy. Reports soreness in bilateral shoulders and knees. Number no tprovided. PT provides rest breaks as needed to manage pain. Pt performs WC mobility to gym to work on functional mobility, UE coordination and endurance. Pt then stands with CGA and ambulates x175' with RW and minA overall, with cues for upright gaze to improve posture and balance, increasing bilateral step height and maintaining neutral rotation of hips throughout gait cycle. Pt does have decreased control of transition back to WC, requiring modA to safely guide hips back to WC. Pt attributes loss of control to tripping over shoes. Pt takes extended seated rest break.   Activity progressed by having pt ambulate without AD. Pt stands with minA. Pt ambulates 2x35' with seated rest break. Pt requires baseline of modA for trunk support and facilitation of lateral weight shifting and stability, and has x3-4 instances in which he requires maxA to totalA due to complete LOB with ataxic leg movements and loss of postural control. Pt responds well to cues for upright gaze, and balance suffers considerably when pt looks straight down at ground, as is his tendency.   Following rest break, pt ambulates 40', including 180 degree turn, with modA and no AD. Pt has improved carryover of upright gaze and no complete LOBs, but does have significant bilateral ataxia. Pt also verbalizes that "Rt knee is giving out" toward end of gait, but no buckling noted.   PT adds 4lb ankle weights to each leg to provide increased NM feedback in attempt to limit ataxia. Pt  ambulates same 41' with similar assistance required, and appears to have decreased ataxia in BLEs, though still requires maxA during one instance due to poor motor control and LOB.   WC transport back to room. Left seated with alarm intact and all needs within reach.    Therapy Documentation Precautions:  Precautions Precautions: Fall Precaution Comments: R sided reduced coordination, R sided weakness and LLE weakness/reduced motor planning during ambulation Restrictions Weight Bearing Restrictions: No   Therapy/Group: Individual Therapy  Beau Fanny, PT, DPT 08/25/2022, 4:57 PM

## 2022-08-25 NOTE — Progress Notes (Signed)
PROGRESS NOTE   Subjective/Complaints: Continues to complain of confusion Feels like he remembers his old room Remembers his family members visiting yesterday   Objective: ROS: cognitive impairment, +insomnia- improved   No results found. Recent Labs    08/23/22 0725  WBC 8.4  HGB 14.2  HCT 40.5  PLT 185    Recent Labs    08/23/22 0725  NA 138  K 3.4*  CL 102  CO2 28  GLUCOSE 120*  BUN 11  CREATININE 0.97  CALCIUM 9.5     Intake/Output Summary (Last 24 hours) at 08/25/2022 1053 Last data filed at 08/25/2022 0723 Gross per 24 hour  Intake 720 ml  Output 1325 ml  Net -605 ml        Physical Exam: Vital Signs Blood pressure 113/71, pulse 76, temperature 98.1 F (36.7 C), temperature source Oral, resp. rate 17, height 5\' 6"  (1.676 m), weight 77 kg, SpO2 95 %. Gen: no distress, normal appearing HEENT: oral mucosa pink and moist, NCAT Cardio: Reg rate Chest: normal effort, normal rate of breathing Abd: soft, non-distended Ext: no edema Psych: pleasant, normal affect Ext: no clubbing, cyanosis, or edema Psych: pleasant and cooperative  Skin: intact Neurologic: distracted. Oriented to self, month, year. Didn't know where he was. Cranial nerves II through XII intact, motor strength is 4/5 in bilateral deltoid, bicep, tricep, grip, 3- bilat hip flexor, knee extensors, ankle dorsiflexor and plantar flexor Sensory exam normal for light touch and pain in all 4 limbs. No limb ataxia or cerebellar signs. No abnormal tone appreciated.   musculoskeletal: Full range of motion in all 4 extremities. No joint swelling   Assessment/Plan: 1. Functional deficits which require 3+ hours per day of interdisciplinary therapy in a comprehensive inpatient rehab setting. Physiatrist is providing close team supervision and 24 hour management of active medical problems listed below. Physiatrist and rehab team continue to assess  barriers to discharge/monitor patient progress toward functional and medical goals  Care Tool:  Bathing    Body parts bathed by patient: Right arm, Left arm, Chest, Abdomen, Front perineal area, Right upper leg, Left upper leg, Face, Buttocks, Left lower leg, Right lower leg   Body parts bathed by helper: Buttocks, Left lower leg, Right lower leg     Bathing assist Assist Level: Contact Guard/Touching assist     Upper Body Dressing/Undressing Upper body dressing   What is the patient wearing?: Pull over shirt    Upper body assist Assist Level: Set up assist    Lower Body Dressing/Undressing Lower body dressing      What is the patient wearing?: Underwear/pull up, Pants     Lower body assist Assist for lower body dressing: Contact Guard/Touching assist     Toileting Toileting    Toileting assist Assist for toileting: Minimal Assistance - Patient > 75% Assistive Device Comment: walker and commode over toilet   Transfers Chair/bed transfer  Transfers assist     Chair/bed transfer assist level: Minimal Assistance - Patient > 75%     Locomotion Ambulation   Ambulation assist      Assist level: Minimal Assistance - Patient > 75% Assistive device: Walker-rolling Max distance: 80  Walk 10 feet activity   Assist     Assist level: Minimal Assistance - Patient > 75% Assistive device: Walker-rolling   Walk 50 feet activity   Assist Walk 50 feet with 2 turns activity did not occur: Safety/medical concerns  Assist level: Minimal Assistance - Patient > 75% Assistive device: Walker-rolling    Walk 150 feet activity   Assist Walk 150 feet activity did not occur: Safety/medical concerns         Walk 10 feet on uneven surface  activity   Assist Walk 10 feet on uneven surfaces activity did not occur: Safety/medical concerns         Wheelchair     Assist Is the patient using a wheelchair?: Yes Type of Wheelchair: Manual    Wheelchair  assist level: Dependent - Patient 0% Max wheelchair distance: 300 ft    Wheelchair 50 feet with 2 turns activity    Assist        Assist Level: Dependent - Patient 0%   Wheelchair 150 feet activity     Assist      Assist Level: Dependent - Patient 0%   Blood pressure 113/71, pulse 76, temperature 98.1 F (36.7 C), temperature source Oral, resp. rate 17, height 5\' 6"  (1.676 m), weight 77 kg, SpO2 95 %.    Medical Problem List and Plan: 1. Functional deficits secondary to left pontine and bilateral thalamic, parietal embolic strokes             -patient may shower             -ELOS/Goals: 10 to 14 days, supervision goals PT/OT/SLP   Grounds pass ordered  -Continue CIR therapies including PT, OT, and SLP   -Interdisciplinary Team Conference today    2.  Atrial fibrillation: continue Eliquis             -antiplatelet therapy: aspirin    3. Knee pain: Tylenol, Robaxin as needed. Continue knee sleeve.    4. Mood/Behavior/Sleep: LCSW to evaluate and provide emotional support             -continue Lexapro             -antipsychotic agents: n/a   -6/2 night time restlessness and agitation this weekend   -UCX negative   -increase seroquel to 50mg  at 2000   -NEED to keep sleep chart 5. Neuropsych/cognition: This patient is not capable of making decisions on his own behalf.   6. Skin/Wound Care: Routine skin care checks   7. Fluids/Electrolytes/Nutrition/aphasia: Routine Is and Os and follow-up chemistries -On modified diet    8: Hypertension: monitor TID and prn; at home on tenoretic             -decrease atenolol to 12.5mg  daily.    6/2 bp still elevated this morning, increase atenolol to 25mg  daily 9: Hyperlipidemia: continue statin, Zetia   10: CAD s/p DES, 2018, 2019             -continue aspirin and statin, Zetia, BB   11: pAF: continue Eliquis and BB   12: DM-2: at home on metformin 1000 mg BID, Humalog 75/25 40 units, Ozempic             -d/c ISS,  monitor intake, CBGs QID             -Add carb controlled to dysphagia diet  Increase acarbose to 50mg  TID  Increase magnesium gluconate 500g HS  -begin 70/30 insulin bid at 6 units  6/2 increase to 10u bid 13: Tobacco use: cessation counseling; continue Nicoderm   14: Hypothyroidism: continue Synthroid   15: Overweight: dietary/exercise counseling, increase magneisum gluconate to 500mg  HS   16: Left foot pain: possible non-displaced fracture vs. osseous remolding 5th toe             -continue Darco shoe, buddy wrap  17. Bradycardia: d/c atenolol  18. Hypokalemia: ordered klor once on 5/27, and on 6/3  19. Dizziness: d/c atenolol  20. Right knee buckling/chronic pain: knee sleeve ordered, Zynex Nexwave ordered for outpatient use  22. Nodule on anterior chest wall: nontender, discussed may be lipoma  23. Tachycardia: add toprol XL HS  24. Insomnia: add trazodone 50mg  HS  25. HTN: add toprol 12.5mg  XL HS    LOS: 12 days A FACE TO FACE EVALUATION WAS PERFORMED  Drema Pry Zula Hovsepian 08/25/2022, 10:53 AM

## 2022-08-25 NOTE — Progress Notes (Signed)
Patient ID: Anthony Woods, male   DOB: June 12, 1955, 67 y.o.   MRN: [id number]  Met with pt and wife and then two sister's came into room. Discussed team conference progress toward his goals of CGA-supervision. Discussed with all wife is not able to provide any physical care due to her own health issues, so discussed options of SNF and more rehab until can return home at a supervision level. All in agreement with this. Pt states: " I can not control my leg it is an alien at times. "  He is in agreement with going to a SNF and would like to go to [facility] if has a bed since lives in [place]. Will try to get him in there first and then go from there. Wife has been here and observed in therapies and is not able to provide the care he currently needs. Work on SNF bed.

## 2022-08-25 NOTE — Progress Notes (Signed)
Occupational Therapy Session Note  Patient Details  Name: Anthony Woods MRN: [id number] Date of Birth: Jul 02, 1955  Today's Date: 08/25/2022 OT Individual Time: 1110-1210 OT Individual Time Calculation (min): 60 min    Short Term Goals: Week 2:  OT Short Term Goal 1 (Week 2): Patient to perform toileting with SBA for safety using grab bars OT Short Term Goal 2 (Week 2): Patient to perform LB dressing with set-up and SBA for safety utilizing DME PRN OT Short Term Goal 3 (Week 2): Patient to perform bathing with SBA for safety cues and DME PRN OT Short Term Goal 4 (Week 2): Patient to display improved BUE coordination by opening containers on meal tray with vc's as needed  Skilled Therapeutic Interventions/Progress Updates:   Pt seen for skilled OT session with another orienting OT present. Pt open for all activity and treatment. Seated level in w/c session was initiated to address NMRE with B hand integration and R handed coordination of oral care. Set up with increased time demonstrated. Pt then amb sink side to and from stall shower with Rw with min A  overall but several episodes of mild LOB to R with general self recovery and needing cues to widen BOS and maintain inside RW. Cues for hand placement for sit to and from stand. Doffed all clothing seated and standing with support on shower chair with CGA including shoes and socks. Shower with overall CGA for balance with cues for safe sequencing of steps. Pt used grab bars for standing and was able to use R UE for buttocks bathing with min A fading to CGA. Continues to remain ataxic and unsteady with some impulsivity and R inattention. Dressing on shower seat with close S for pull over shirt, min A LE pull on underwear and pants. Figure 4 for socks with min A and mod A for tennis shoes. Wife arrived and pt was able to amb with min A back out to w/c for lunch meal and visit. Left pt w/c level with safety alarm engaged, needs and nurse call button in  reach.    Therapy Documentation Precautions:  Precautions Precautions: Fall Precaution Comments: R sided reduced coordination, R sided weakness and LLE weakness/reduced motor planning during ambulation Restrictions Weight Bearing Restrictions: No  Vicenta Dunning 08/25/2022, 7:52 AM

## 2022-08-25 NOTE — Consult Note (Signed)
Neuropsychological Consultation Comprehensive Inpatient Rehab   Patient:   Anthony Woods   DOB:   1955-05-31  MR Number:  [id number]  Location:  [facility] [address] CENTER A [id number] [place] [address] Dept: [phone] Loc: [phone]           Date of Service:   08/25/2022  Start Time:   1 PM End Time:   2 PM  Provider/Observer:  Arley Phenix, Psy.D.       Clinical Neuropsychologist       Billing Code/Service: [phone]  Reason for Service:    Anthony Woods is a 67 year old male referred for neuropsychological consultation during his current admission onto the comprehensive inpatient rehabilitation unit.  Patient had initially presented to the emergency department on 08/10/2022 reporting multiple falls recently.  CT head multiple x-rays were performed the patient was discharged home as he did not want to stay for further workup.  Patient return to ED on 08/11/2022 with increased confusion and hyperglycemia.  Patient's wife had reported he was not compliant with his medications and had not taken any since January.  Patient was admitted due to confusion, falls and difficulty over the prior few weeks 20 05/2022 he was developing increasing symptoms.  MRI showed multiple acute infarcts of a subacute infarct with chronic lacunar infarcts.  Patient was ultimately referred to the comprehensive inpatient rehabilitation unit after therapy evaluations were completed due to ongoing dysfunction secondary to scattered acute and subacute ischemic infarcts primarily in the left aspect of the pons.  During the visit today, the patient and his wife were present along with another family member.  The patient has improved from his acute level of confusion but clearly was having ongoing cognitive difficulties.  I suspect that these are longstanding difficulties and the easily asked others on multiple occasions to answer simple  questions.  The patient did admit to not keeping up with his medications but family members reported that they were unaware that he was not taking them as prescribed.  Some of these difficulties may be related to MRI showing an older right parietal lobe stroke event.  In any event, the patient had limited cognitive and intellectual skills.  He was somewhat impulsive with 2 occasions where he attempted to stand up in his wheelchair with safety strap in place and had to be redirected.  This did not appear to be the first time that he had had trouble with some impulse control.  The patient was oriented to person and place but disoriented to time although he was somewhat close.  Patient was aware that he had had both the recent as well as older strokes events but was not able to really understand what that meant in any in-depth way.  The patient showed deficits with regard to expressive language but does appear to have adequate receptive language.  His speech deficits were noted and corrected by family members during the visit.  Patient denied significant depression or anxiety although he does have a history of anxiety symptoms and acknowledges times where he gets anxious he reports that his anxiety levels have not kept him from participating in therapeutic interventions.  The patient reports he feels like he is making progress with his CIR admission but is still looking forward to discharge home.  There is a question about whether he will be able to be discharged home and patient may be transferred from the unit to skilled nursing facility  as his recovery and level of deficits may be more than his family can manage themselves.  HPI for the current admission:    HPI: Anthony Woods is a 67 year old male who presented initially to the ED on 08/10/2022 complaining of multiple falls. Found to be hyperglycemic. CT scan of right knee/distal femur, CT head and c-spine, pelvic x-rays and chest x-ray performed. Patient  discharged home as he did not want to stay for further work-up. Re-presented on 5/22 with increased confusion and hyperglycemia. Patient and wife states he is not compliant with his medications and not been taking any since January of this year. Repeat imaging with CT c-spine, head and left foot and rib x-rays performed. A1c 15.5%. Xarelto resumed and aspirin 81 mg added.  Informed by pharmacy that apixaban will be better covered by patient's insurance. Changed to apixaban.  He was independent with all mobility and ADLs, but has had increased confusion, falls, and difficulty in last few weeks progressing to the point where his wife is unable to get him up. On 5/23 per OT, max assist for simulated selfcare sit to stand EOB. Increased lean and LOB to the right and posteriorly with dressing tasks. Mod assist for sit to stand with max assist for taking steps forward and backwards with the RW. Increased ataxia noted in standing and with using the RUE. On dysphagia 3 diet.  MRI showed multiple acute infarcts, a subacute infarct, chronic small vessel ischemic disease and chronic lacunar infarcts.  Patient and wife states he is not compliant with his medications and not been taking any since January of this year. The patient requires inpatient medicine and rehabilitation evaluations and services for ongoing dysfunction secondary to scattered acute ischemic infarcts in the left aspect of the pons.  Medical History:   Past Medical History:  Diagnosis Date   Allergy to bee sting    Anxiety    Arthritis    "right knee" (05/12/2016)   CAD (coronary artery disease)    a. DES to LCx 04/2016. b. DES to prox Cx 08/2017. c. Stable cath 09/2018 with mild nonobstructive residual disease, normal EF.   Headache    "when his sugar goes too high or too low" (05/12/2016)   HTN (hypertension)    Hyperlipidemia    Hypothyroidism    Macular degeneration    Obesity    PAF (paroxysmal atrial fibrillation) (HCC)    Type II  diabetes mellitus (HCC)          Patient Active Problem List   Diagnosis Date Noted   Mild neurocognitive disorder due to another medical condition 08/25/2022   Acute embolic stroke (HCC) 08/13/2022   Acute CVA (cerebrovascular accident) (HCC) 08/12/2022   Dyslipidemia 08/12/2022   Noncompliance with medication regimen 08/12/2022   DNR (do not resuscitate) 08/12/2022   Knee strain, right, initial encounter 08/27/2020   Carotid bruit 05/22/2020   COPD (chronic obstructive pulmonary disease) (HCC) 05/22/2020   Neoplasm of uncertain behavior of skin 01/10/2020   Right knee pain 05/01/2019   DOE (dyspnea on exertion)    Chest pain 09/08/2018   Wrist pain, acute, left 04/24/2018   Angina pectoris, unspecified (HCC)    Stress at work 12/15/2016   Tobacco dependence 06/04/2016   Atrial fibrillation with RVR (HCC) 05/12/2016   Elevated troponin    Atrial fibrillation (HCC) 05/11/2016   Allergic rhinitis 08/12/2015   Chest pain with moderate risk of acute coronary syndrome 04/14/2015   Urinary frequency 04/14/2015   Osteoarthritis  of right knee 12/09/2014   Snoring 09/25/2013   Rash and nonspecific skin eruption 08/08/2012   Insomnia 08/08/2012   Class 1 obesity due to excess calories with body mass index (BMI) of 34.0 to 34.9 in adult 06/10/2011   Well adult exam 06/10/2011   Cough due to angiotensin-converting enzyme inhibitor 10/08/2010   SHOULDER PAIN 03/09/2010   NECK PAIN 03/09/2010   BRONCHITIS, ACUTE 12/13/2007   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 his dress was Appropriate and he was Fairly Groomed and his manners were Appropriate to the situation.  his  participation was indicative of Appropriate, Inattentive, and Redirectable behaviors.  There were physical disabilities noted.  he displayed an appropriate level of cooperation and motivation.    Interactions:    Active Redirectable  Attention:   abnormal and attention span appeared shorter than expected for age  Memory:   abnormal; remote memory intact, recent memory impaired  Visuo-spatial:   abnormal  Speech (Volume):  low  Speech:   slurred; garbled  Thought Process:  Circumstantial and Tangential  Concrete  Though Content:  WNL; not suicidal and not homicidal  Orientation:   person, place, and situation  Judgment:   Poor  Planning:   Poor  Affect:    Appropriate  Mood:    Anxious  Insight:   Shallow  Intelligence:   low  Psychiatric History:  Patient with prior history of anxiety and continues to take Lexapro but unclear whether he is completely compliant.   Family Med/Psych History:  Family History  Problem Relation Age of Onset   Diabetes Mother    Cancer Mother        male ca   Colon cancer Mother 36   Diabetes Father    Cancer Father        stomach   CVA Father    Stomach cancer Father    Breast cancer Sister    Diabetes Sister    CAD Maternal Grandmother    CVA Paternal Uncle    Esophageal cancer Neg Hx    Rectal cancer Neg Hx     Impression/DX:   Anthony Woods is a 67 year old male referred for neuropsychological consultation during his current admission onto the comprehensive inpatient rehabilitation unit.  Patient had initially presented to the emergency department on 08/10/2022 reporting multiple falls recently.  CT head multiple x-rays were performed the patient was discharged home as he did not want to stay for further workup.  Patient return to ED on 08/11/2022 with increased confusion and hyperglycemia.  Patient's wife had reported he was not compliant with his medications and had not taken any since January.  Patient was admitted due to  confusion, falls and difficulty over the prior few weeks 20 05/2022 he was developing increasing symptoms.  MRI showed multiple acute infarcts of a subacute infarct with chronic lacunar infarcts.  Patient was ultimately referred to the comprehensive inpatient rehabilitation unit after therapy evaluations were completed due to ongoing dysfunction secondary to scattered acute and subacute ischemic infarcts primarily in the left aspect of the pons.  During the visit today, the patient and his wife were present along with another family member.  The patient has improved from  his acute level of confusion but clearly was having ongoing cognitive difficulties.  I suspect that these are longstanding difficulties and the easily asked others on multiple occasions to answer simple questions.  The patient did admit to not keeping up with his medications but family members reported that they were unaware that he was not taking them as prescribed.  Some of these difficulties may be related to MRI showing an older right parietal lobe stroke event.  In any event, the patient had limited cognitive and intellectual skills.  He was somewhat impulsive with 2 occasions where he attempted to stand up in his wheelchair with safety strap in place and had to be redirected.  This did not appear to be the first time that he had had trouble with some impulse control.  The patient was oriented to person and place but disoriented to time although he was somewhat close.  Patient was aware that he had had both the recent as well as older strokes events but was not able to really understand what that meant in any in-depth way.  The patient showed deficits with regard to expressive language but does appear to have adequate receptive language.  His speech deficits were noted and corrected by family members during the visit.  Patient denied significant depression or anxiety although he does have a history of anxiety symptoms and acknowledges times  where he gets anxious he reports that his anxiety levels have not kept him from participating in therapeutic interventions.  The patient reports he feels like he is making progress with his CIR admission but is still looking forward to discharge home.  There is a question about whether he will be able to be discharged home and patient may be transferred from the unit to skilled nursing facility as his recovery and level of deficits may be more than his family can manage themselves.  Disposition/Plan:  Today we worked on coping and adjustment issues and also answered questions posed by the patient but to a greater degree questions posed by the family member about what to expect going forward.  Diagnosis:    New cognitive deficits and motor deficits secondary to recent cerebrovascular event with prior vascular events noted.         Electronically Signed   _______________________ Arley Phenix, Psy.D. Clinical Neuropsychologist

## 2022-08-25 NOTE — Plan of Care (Signed)
  Problem: RH Problem Solving Goal: LTG Patient will demonstrate problem solving for (SLP) Description: LTG:  Patient will demonstrate problem solving for basic/complex daily situations with cues  (SLP) Flowsheets (Taken 08/25/2022 1135) LTG Patient will demonstrate problem solving for: Minimal Assistance - Patient > 75% Note: Goal downgraded due to inconsistent progress

## 2022-08-25 NOTE — Patient Care Conference (Signed)
Inpatient RehabilitationTeam Conference and Plan of Care Update Date: 08/25/2022   Time: 11:29 AM    Patient Name: Anthony Woods      Medical Record Number: [id number]  Date of Birth: [birth year] Sex: Male         Room/Bed: 4W16C/4W16C-01 Payor Info: Payor: MEDICARE / Plan: MEDICARE PART A AND B / Product Type: *No Product type* /    Admit Date/Time:  08/13/2022  1:17 PM  Primary Diagnosis:  Acute embolic stroke [facility])  Hospital Problems: Principal Problem:   Acute embolic stroke [facility])    Expected Discharge Date: Expected Discharge Date:  (SNF)  Team Members Present: Physician leading conference: Dr. Sula Soda Social Worker Present: Dossie Der, LCSW Nurse Present: Chana Bode, RN PT Present: Wynelle Link, PT OT Present: Roney Mans, OT SLP Present: Feliberto Gottron, SLP PPS Coordinator present : Edson Snowball, PT     Current Status/Progress Goal Weekly Team Focus  Bowel/Bladder   Pt is continent of bowel/bladder   Pt will remain continent of bowel/bladder   Will assess qshift and PRN    Swallow/Nutrition/ Hydration   Dys 3 and thin liquid   Mod I  trials of regular solids, implementation of swallowing strategies    ADL's   progressing to overall min a, continues to need cues and support for motor control, balance and safety   Supervision/ set up   strong focus on balance and safety in standing and with amb for ADL's to reach S level, family educ    Mobility   supervision bed mobility, CGA sit<>stand and stand pivot transfers, CGA gait 135ft and minA for >136ft using RW. Primary barriers is chronic knee pain, inattention, and generalized deconditioning/weakness   supervision/ CGA overall  - wife cannot provide physical assist.  gait training, safety awareness, motor planning, standing balance    Communication   80% speech intelligibiltiy with min A   sup A   carrover of speech intelligibility    Safety/Cognition/ Behavioral Observations  min to  mod A   supervision to min A   attention, problem solving, and recall    Pain   Pt denies pain   Pt will continue to deny pain   Will assess qshift and PRN    Skin   Pt's skin is intact   Pt's skin will remain intact  Will assess qshift and PRN      Discharge Planning:  Wife is only able to provide supervision due to her own health issues. If he requires more care may need to pursue alternatives-ie SNF. Will discuss best plan for him with wife after team conference.   Team Discussion: Patient with confusion, ataxia, problem solving deficits and memory issues with insomnia post stroke.  Patient on target to meet rehab goals: yes, currently needs CGA for sit - stand and able to ambulate up to 100' with CGA but needs min assist/cues for support and balance.  Patient presents with 80% intelligibility of speech; maintained on a dysphagia 3 texture diet thin liquids.  Working on orientation, basic and complex problem solving with use of external aides. Overall goals for discharge set for min assist.  *See Care Plan and progress notes for long and short-term goals.   Revisions to Treatment Plan:  N/a   Teaching Needs: Safety, medications, dietary modifications, transfers, toileting, etc.  Current Barriers to Discharge: Home enviroment access/layout and Lack of/limited family support  Possible Resolutions to Barriers: Family education SNF placement recommended     Medical Summary  Current Status: confusion, type 2 diabetes, atrial fibrillation, HTN  Barriers to Discharge: Medical stability  Barriers to Discharge Comments: confusion, type 2 diabetes, atrial fibrillation, HTN Possible Resolutions to [employer] Focus: labs and UA checked and discussed that these were stable, continue eliquis, added lopressor, d/ced atenolol, added insulin and acarbose   Continued Need for Acute Rehabilitation Level of Care: The patient requires daily medical management by a physician with  specialized training in physical medicine and rehabilitation for the following reasons: Direction of a multidisciplinary physical rehabilitation program to maximize functional independence : Yes Medical management of patient stability for increased activity during participation in an intensive rehabilitation regime.: Yes Analysis of laboratory values and/or radiology reports with any subsequent need for medication adjustment and/or medical intervention. : Yes   I attest that I was present, lead the team conference, and concur with the assessment and plan of the team.   Chana Bode B 08/25/2022, 3:03 PM

## 2022-08-26 DIAGNOSIS — I639 Cerebral infarction, unspecified: Secondary | ICD-10-CM | POA: Diagnosis not present

## 2022-08-26 LAB — GLUCOSE, CAPILLARY
Glucose-Capillary: 103 mg/dL — ABNORMAL HIGH (ref 70–99)
Glucose-Capillary: 128 mg/dL — ABNORMAL HIGH (ref 70–99)
Glucose-Capillary: 131 mg/dL — ABNORMAL HIGH (ref 70–99)
Glucose-Capillary: 175 mg/dL — ABNORMAL HIGH (ref 70–99)

## 2022-08-26 NOTE — Progress Notes (Signed)
Occupational Therapy Session Note  Patient Details  Name: Anthony Woods MRN: [id number] Date of Birth: 10/24/1955  Today's Date: 08/26/2022 OT Individual Time: 1400-1445 OT Individual Time Calculation (min): 45 min    Short Term Goals: Week 2:  OT Short Term Goal 1 (Week 2): Patient to perform toileting with SBA for safety using grab bars OT Short Term Goal 2 (Week 2): Patient to perform LB dressing with set-up and SBA for safety utilizing DME PRN OT Short Term Goal 3 (Week 2): Patient to perform bathing with SBA for safety cues and DME PRN OT Short Term Goal 4 (Week 2): Patient to display improved BUE coordination by opening containers on meal tray with vc's as needed  Skilled Therapeutic Interventions/Progress Updates:   OT session focus on Goal #4 with significant progress demonstrated from previous week focussed on bilateral integration primarily R [facility] and cog/visual perceptual components of function. Wife in room upon OT with orientee OT arrival. Pt open to all activity presented and re-visit of more complex version of task trialed last week for baseline versus gains demonstrated. Pt transported to middle gym by clinician for time mngt via w/c. completed FMC activity in sitting with R UE for 40 item 5 color moderate complexity small peg design using B hands progressing to R hand use with tweezers for manipulation. Overall 95% accuracy with R UE even during distracted/high stimulation setting selective attention challenges. Pt improving w/c skills as well and able to integrate, scan and maneuver within open and more distracting spaces. Once returned to room, pt left with chair alarm set, needs, nurse call button and wife still bedside.    Therapy Documentation Precautions:  Precautions Precautions: Fall Precaution Comments: R sided reduced coordination, R sided weakness and LLE weakness/reduced motor planning during ambulation Restrictions Weight Bearing Restrictions:  No    Therapy/Group: Individual Therapy  Vicenta Dunning 08/26/2022, 7:50 AM

## 2022-08-26 NOTE — Progress Notes (Signed)
PROGRESS NOTE   Subjective/Complaints: No new complaints this morning Joking Discussed that I updated his sister yesterday CBGs continue to be better controlled   Objective: ROS: cognitive impairment, +insomnia- improved, knee pain is better controlled   No results found. No results for input(s): "WBC", "HGB", "HCT", "PLT" in the last 72 hours.   No results for input(s): "NA", "K", "CL", "CO2", "GLUCOSE", "BUN", "CREATININE", "CALCIUM" in the last 72 hours.    Intake/Output Summary (Last 24 hours) at 08/26/2022 1349 Last data filed at 08/26/2022 1231 Gross per 24 hour  Intake 950 ml  Output 250 ml  Net 700 ml        Physical Exam: Vital Signs Blood pressure 114/74, pulse 79, temperature 97.7 F (36.5 C), temperature source Oral, resp. rate 18, height 5\' 6"  (1.676 m), weight 77 kg, SpO2 92 %. Gen: no distress, normal appearing HEENT: oral mucosa pink and moist, NCAT Cardio: Reg rate Chest: normal effort, normal rate of breathing Abd: soft, non-distended Ext: no edema Psych: pleasant, normal affect  Ext: no clubbing, cyanosis, or edema Psych: pleasant and cooperative  Skin: intact Neurologic: distracted. Oriented to self, month, year. Didn't know where he was. Cranial nerves II through XII intact, motor strength is 4/5 in bilateral deltoid, bicep, tricep, grip, 3- bilat hip flexor, knee extensors, ankle dorsiflexor and plantar flexor Sensory exam normal for light touch and pain in all 4 limbs. No limb ataxia or cerebellar signs. No abnormal tone appreciated.   musculoskeletal: Full range of motion in all 4 extremities. No joint swelling   Assessment/Plan: 1. Functional deficits which require 3+ hours per day of interdisciplinary therapy in a comprehensive inpatient rehab setting. Physiatrist is providing close team supervision and 24 hour management of active medical problems listed below. Physiatrist and rehab  team continue to assess barriers to discharge/monitor patient progress toward functional and medical goals  Care Tool:  Bathing    Body parts bathed by patient: Right arm, Left arm, Chest, Abdomen, Front perineal area, Right upper leg, Left upper leg, Face, Buttocks, Left lower leg, Right lower leg   Body parts bathed by helper: Buttocks, Left lower leg, Right lower leg     Bathing assist Assist Level: Contact Guard/Touching assist     Upper Body Dressing/Undressing Upper body dressing   What is the patient wearing?: Pull over shirt    Upper body assist Assist Level: Set up assist    Lower Body Dressing/Undressing Lower body dressing      What is the patient wearing?: Underwear/pull up, Pants     Lower body assist Assist for lower body dressing: Contact Guard/Touching assist     Toileting Toileting    Toileting assist Assist for toileting: Minimal Assistance - Patient > 75% Assistive Device Comment: walker and commode over toilet   Transfers Chair/bed transfer  Transfers assist     Chair/bed transfer assist level: Minimal Assistance - Patient > 75%     Locomotion Ambulation   Ambulation assist      Assist level: Minimal Assistance - Patient > 75% Assistive device: Walker-rolling Max distance: 80   Walk 10 feet activity   Assist     Assist level: Minimal  Assistance - Patient > 75% Assistive device: Walker-rolling   Walk 50 feet activity   Assist Walk 50 feet with 2 turns activity did not occur: Safety/medical concerns  Assist level: Minimal Assistance - Patient > 75% Assistive device: Walker-rolling    Walk 150 feet activity   Assist Walk 150 feet activity did not occur: Safety/medical concerns         Walk 10 feet on uneven surface  activity   Assist Walk 10 feet on uneven surfaces activity did not occur: Safety/medical concerns         Wheelchair     Assist Is the patient using a wheelchair?: Yes Type of Wheelchair:  Manual    Wheelchair assist level: Dependent - Patient 0% Max wheelchair distance: 300 ft    Wheelchair 50 feet with 2 turns activity    Assist        Assist Level: Dependent - Patient 0%   Wheelchair 150 feet activity     Assist      Assist Level: Dependent - Patient 0%   Blood pressure 114/74, pulse 79, temperature 97.7 F (36.5 C), temperature source Oral, resp. rate 18, height 5\' 6"  (1.676 m), weight 77 kg, SpO2 92 %.    Medical Problem List and Plan: 1. Functional deficits secondary to left pontine and bilateral thalamic, parietal embolic strokes             -patient may shower             -ELOS/Goals: 10 to 14 days, supervision goals PT/OT/SLP   Grounds pass ordered  -Continue CIR therapies including PT, OT, and SLP   Updated sister   2.  Atrial fibrillation: continue Eliquis             -antiplatelet therapy: aspirin    3. Knee pain: Tylenol, Robaxin as needed. Continue knee sleeve.    4. Mood/Behavior/Sleep: LCSW to evaluate and provide emotional support             -continue Lexapro             -antipsychotic agents: n/a   -6/2 night time restlessness and agitation this weekend   -UCX negative   -increase seroquel to 50mg  at 2000   -NEED to keep sleep chart 5. Neuropsych/cognition: This patient is not capable of making decisions on his own behalf.   6. Skin/Wound Care: Routine skin care checks   7. Fluids/Electrolytes/Nutrition/aphasia: Routine Is and Os and follow-up chemistries -On modified diet    8: Hypertension: monitor TID and prn; at home on tenoretic             -decrease atenolol to 12.5mg  daily.    6/2 bp still elevated this morning, increase atenolol to 25mg  daily 9: Hyperlipidemia: continue statin, Zetia   10: CAD s/p DES, 2018, 2019             -continue aspirin and statin, Zetia, BB   11: pAF: continue Eliquis and BB   12: DM-2: at home on metformin 1000 mg BID, Humalog 75/25 40 units, Ozempic             -d/c ISS, monitor  intake, CBGs QID             -Add carb controlled to dysphagia diet  -placed nursing order to not give patient any juice or soda  Increase acarbose to 50mg  TID  Increase magnesium gluconate 500g HS  -begin 70/30 insulin bid at 6 units 6/2 increase to 10u bid  13: Tobacco use: cessation counseling; continue Nicoderm   14: Hypothyroidism: continue Synthroid   15: Overweight: dietary/exercise counseling, increase magneisum gluconate to 500mg  HS   16: Left foot pain: possible non-displaced fracture vs. osseous remolding 5th toe             -continue Darco shoe, buddy wrap  17. Bradycardia: d/c atenolol  18. Hypokalemia: ordered klor once on 5/27, and on 6/3  19. Dizziness: d/c atenolol  20. Right knee buckling/chronic pain: knee sleeve ordered, Zynex Nexwave ordered for outpatient use  22. Nodule on anterior chest wall: nontender, discussed may be lipoma  23. Tachycardia: add toprol XL HS  24. Insomnia: increase trazodone to 75mg  HS  25. HTN: add toprol 12.5mg  XL HS, BP reviewed and is currently under excellent control    LOS: 13 days A FACE TO FACE EVALUATION WAS PERFORMED  Krysia Zahradnik P Jaz Mallick 08/26/2022, 1:49 PM

## 2022-08-26 NOTE — Progress Notes (Signed)
Physical Therapy Session Note  Patient Details  Name: Anthony Woods MRN: [id number] Date of Birth: 1955-07-12  Today's Date: 08/26/2022 PT Individual Time: 4098-1191 PT Individual Time Calculation (min): 36 min   Short Term Goals: Week 2:  PT Short Term Goal 1 (Week 2): STG = LTG due to ELOS  Skilled Therapeutic Interventions/Progress Updates:    pt received in bed and agreeable to therapy. Pt finishing breakfast at start of session. No complaint of pain. Pt directed care for morning dressing. Shirt at bed level with set up, pants with min A for threading over gripper socks and standing balance while pulling over hips. Min A for shoes, including reaching to floor. Sit to stand with min A during session to RW. Pt ambulated with min A to sink to wash hands. Reports knee discomfort, so donned knee sleeve tot A. Pt then performed 2 bouts of ambulation, ~80 ft with min A overall. Noted anterior trunk lean, scissoring, and low foot clearance. Needed cues to pause and reset body position and limited by fatigue toward end of each bout. Pt propelled w/c back to room for endurance and functional mobility. Pt was left with all needs in reach and alarm active.   Therapy Documentation Precautions:  Precautions Precautions: Fall Precaution Comments: R sided reduced coordination, R sided weakness and LLE weakness/reduced motor planning during ambulation Restrictions Weight Bearing Restrictions: No General:       Therapy/Group: Individual Therapy  Juluis Rainier 08/26/2022, 12:40 PM

## 2022-08-26 NOTE — Progress Notes (Signed)
Physical Therapy Session Note  Patient Details  Name: Anthony Woods MRN: [id number] Date of Birth: [birth year]  Today's Date: 08/26/2022 PT Individual Time: 0905-1000 PT Individual Time Calculation (min): 55 min   Short Term Goals: Week 2:  PT Short Term Goal 1 (Week 2): STG = LTG due to ELOS  Skilled Therapeutic Interventions/Progress Updates:  Patient greeted sitting upright in wheelchair in his room and agreeable to PT treatment session. Patient wheeled dependently to the gym for time management and energy conservation.   Patient performed various sit/stands with the use of a RW and MinA- VC for proper hand placement prior to standing with inconsistent carryover noted.   Patient gait trained x180' with RW and Min/ModA as patient fatigued for improved stability- VC for stepping within the frame of the RW, improved postural extension with forward gaze and marching with B LE in order to improve overall step length and foot clearance and patient tends to rag his feet leading to impaired safety and stability.   Patient performed x10 sit/stands with RW and green TB around his waist as resistance in order to increase posterior chain activation. MinA overall from therapist with rehab tech steadying wheelchair since TB was wrapped around the back of the chair. Multimodal cues for improved postural extension and bringing hips anteriorly with ~3 second hold.   Patient ambulated ~15' to/from his wheelchair and mat table with RW and CGA- VC for improved gait mechanics as above with improved ability to self-correct!   Patient transitioned to/from sitting EOM and supine with Sipv. While supine, patient performed bridges with B hip abduction with green TB around his thighs- Patient performed 2 x 10 with therapist providing VC/TC for improved Rt glute activation and assisting with neutral alignment. Patient required extended supine rest break in between sets secondary to fatigue.   Patient left sitting  upright in wheelchair in his room with posey belt on, call bell within reach and all needs met.    Therapy Documentation Precautions:  Precautions Precautions: Fall Precaution Comments: R sided reduced coordination, R sided weakness and LLE weakness/reduced motor planning during ambulation Restrictions Weight Bearing Restrictions: No  Pain: Patient reports 1/10 R knee pain, increases with activity and decreases/improves with rest. Patient also reports R sternum pain with deep breath, however states this is dull and more muscular in presentation.   Therapy/Group: Individual Therapy  Onisha Cedeno 08/26/2022, 7:49 AM

## 2022-08-26 NOTE — Progress Notes (Signed)
Physical Therapy Session Note  Patient Details  Name: Anthony Woods MRN: [id number] Date of Birth: March 27, 1955  Today's Date: 08/26/2022 PT Individual Time: 1100-1158 PT Individual Time Calculation (min): 58 min   Short Term Goals: Week 2:  PT Short Term Goal 1 (Week 2): STG = LTG due to ELOS  Skilled Therapeutic Interventions/Progress Updates:      Pt sitting in w/c to start - agreeable and denies pain, reports having muscle soreness but unrated.  Propels himself with supervision at wheelchair level using BUE to propel, from his room to main rehab gym, ~134ft.  Gait training 2x182ft with CGA and RW - cues for increasing L step length, reducing L foot internal rotation, staying within walker frame, and keeping forward gaze while ambulating.   BLE strengthening and coordination training with 4" platform, using RW for UE support - alternating toe taps with 4# ankle weights bilaterally. 3x1 minute sets with rest breaks b/w. Standing balance training with no UE support - feet apart E/O and E/C, feet together E/O - CGA/minA guard.  Gait training with no UE support using 4# ankle weights bilaterally for proprioceptive training and awareness - ambulated 10ft + 145ft with minA for the first gait trial and modA for the 2nd due to fatigue. Patient with B ataxia and truncal instability - needing +2 assist for standby assist when fatigued for safety.   Propelled himself with supervision in w/c back to his room, ~187ft, with improved stroke efficiency and ability to maintain straight path, less veering to the R compared to yesterday. Concluded session in room with safety belt alarm on, call bell in reach.    Therapy Documentation Precautions:  Precautions Precautions: Fall Precaution Comments: R sided reduced coordination, R sided weakness and LLE weakness/reduced motor planning during ambulation Restrictions Weight Bearing Restrictions: No General:     Therapy/Group: Individual  Therapy  Hanley Woerner P Jozi Malachi PT 08/26/2022, 7:41 AM

## 2022-08-27 ENCOUNTER — Ambulatory Visit: Payer: Medicare Other | Admitting: Internal Medicine

## 2022-08-27 DIAGNOSIS — I639 Cerebral infarction, unspecified: Secondary | ICD-10-CM | POA: Diagnosis not present

## 2022-08-27 LAB — GLUCOSE, CAPILLARY
Glucose-Capillary: 141 mg/dL — ABNORMAL HIGH (ref 70–99)
Glucose-Capillary: 143 mg/dL — ABNORMAL HIGH (ref 70–99)
Glucose-Capillary: 172 mg/dL — ABNORMAL HIGH (ref 70–99)
Glucose-Capillary: 184 mg/dL — ABNORMAL HIGH (ref 70–99)
Glucose-Capillary: 176 mg/dL — ABNORMAL HIGH (ref 70–99)

## 2022-08-27 NOTE — Progress Notes (Signed)
Speech Language Pathology Daily Session Note  Patient Details  Name: Anthony Woods MRN: [id number] Date of Birth: [birth year]  Today's Date: 08/27/2022 SLP Individual Time: 0830-0913 SLP Individual Time Calculation (min): 43 min  Short Term Goals: Week 2: SLP Short Term Goal 1 (Week 2): STGs= LTGs due to ELOS  Skilled Therapeutic Interventions: Skilled treatment session focused on dysphagia and cognitive goals. Upon arrival, patient was asleep in bed and required more than a reasonable amount of time for arousal. SLP facilitated session by providing overall Min verbal cues for use of swallowing compensatory strategies with his breakfast meal of Dys. 3 textures with thin liquids. No overt s/s of aspiration observed. Recommend patient continue current diet. Min verbal cues were also needed for use of speech intelligibility strategies at the sentence level to achieve ~90% intelligibility. Patient requested to use the bathroom and required Min verbal cues for safety and problem solving with task. Patient was continent of urine. Patient transferred to wheelchair at end of session and left with alarm on and all needs within reach. Continue with current plan of care.      Pain No/Denies Pain   Therapy/Group: Individual Therapy  Anthony Woods 08/27/2022, 3:39 PM

## 2022-08-27 NOTE — Progress Notes (Signed)
PROGRESS NOTE   Subjective/Complaints: No new complaints Therapy notes reviewed Discussed stable blood sugars  Objective: ROS: continued cognitive impairment, +insomnia- improved, knee pain is better controlled   No results found. No results for input(s): "WBC", "HGB", "HCT", "PLT" in the last 72 hours.   No results for input(s): "NA", "K", "CL", "CO2", "GLUCOSE", "BUN", "CREATININE", "CALCIUM" in the last 72 hours.    Intake/Output Summary (Last 24 hours) at 08/27/2022 1207 Last data filed at 08/27/2022 1610 Gross per 24 hour  Intake 1200 ml  Output 1000 ml  Net 200 ml        Physical Exam: Vital Signs Blood pressure 110/72, pulse 82, temperature 98.5 F (36.9 C), temperature source Oral, resp. rate 18, height 5\' 6"  (1.676 m), weight 77 kg, SpO2 98 %. Gen: no distress, normal appearing HEENT: oral mucosa pink and moist, NCAT Cardio: Reg rate Chest: normal effort, normal rate of breathing Abd: soft, non-distended  Ext: no clubbing, cyanosis, or edema Psych: pleasant and cooperative  Skin: intact Neurologic: distracted. Oriented to self, month, year. Didn't know where he was. Cranial nerves II through XII intact, motor strength is 4/5 in bilateral deltoid, bicep, tricep, grip, 3- bilat hip flexor, knee extensors, ankle dorsiflexor and plantar flexor Sensory exam normal for light touch and pain in all 4 limbs. No limb ataxia or cerebellar signs. No abnormal tone appreciated.   musculoskeletal: Full range of motion in all 4 extremities. No joint swelling   Assessment/Plan: 1. Functional deficits which require 3+ hours per day of interdisciplinary therapy in a comprehensive inpatient rehab setting. Physiatrist is providing close team supervision and 24 hour management of active medical problems listed below. Physiatrist and rehab team continue to assess barriers to discharge/monitor patient progress toward functional  and medical goals  Care Tool:  Bathing    Body parts bathed by patient: Right arm, Left arm, Chest, Abdomen, Front perineal area, Right upper leg, Left upper leg, Face, Buttocks, Left lower leg, Right lower leg   Body parts bathed by helper: Buttocks, Left lower leg, Right lower leg     Bathing assist Assist Level: Contact Guard/Touching assist     Upper Body Dressing/Undressing Upper body dressing   What is the patient wearing?: Pull over shirt    Upper body assist Assist Level: Set up assist    Lower Body Dressing/Undressing Lower body dressing      What is the patient wearing?: Underwear/pull up, Pants     Lower body assist Assist for lower body dressing: Contact Guard/Touching assist     Toileting Toileting    Toileting assist Assist for toileting: Minimal Assistance - Patient > 75% Assistive Device Comment: walker and commode over toilet   Transfers Chair/bed transfer  Transfers assist     Chair/bed transfer assist level: Minimal Assistance - Patient > 75%     Locomotion Ambulation   Ambulation assist      Assist level: Minimal Assistance - Patient > 75% Assistive device: Walker-rolling Max distance: 80   Walk 10 feet activity   Assist     Assist level: Minimal Assistance - Patient > 75% Assistive device: Walker-rolling   Walk 50 feet activity  Assist Walk 50 feet with 2 turns activity did not occur: Safety/medical concerns  Assist level: Minimal Assistance - Patient > 75% Assistive device: Walker-rolling    Walk 150 feet activity   Assist Walk 150 feet activity did not occur: Safety/medical concerns         Walk 10 feet on uneven surface  activity   Assist Walk 10 feet on uneven surfaces activity did not occur: Safety/medical concerns         Wheelchair     Assist Is the patient using a wheelchair?: Yes Type of Wheelchair: Manual    Wheelchair assist level: Dependent - Patient 0% Max wheelchair distance:  300 ft    Wheelchair 50 feet with 2 turns activity    Assist        Assist Level: Dependent - Patient 0%   Wheelchair 150 feet activity     Assist      Assist Level: Dependent - Patient 0%   Blood pressure 110/72, pulse 82, temperature 98.5 F (36.9 C), temperature source Oral, resp. rate 18, height 5\' 6"  (1.676 m), weight 77 kg, SpO2 98 %.    Medical Problem List and Plan: 1. Functional deficits secondary to left pontine and bilateral thalamic, parietal embolic strokes             -patient may shower             -ELOS/Goals: 10 to 14 days, supervision goals PT/OT/SLP   Grounds pass ordered  -Continue CIR therapies including PT, OT, and SLP   Updated sister   2.  Atrial fibrillation: conitnue Eliquis             -antiplatelet therapy: aspirin    3. Knee pain: Tylenol, Robaxin as needed. Continue knee sleeve.    4. Mood/Behavior/Sleep: LCSW to evaluate and provide emotional support             -continue Lexapro             -antipsychotic agents: n/a   -6/2 night time restlessness and agitation this weekend   -UCX negative   -increase seroquel to 50mg  at 2000   -NEED to keep sleep chart 5. Neuropsych/cognition: This patient is not capable of making decisions on his own behalf.   6. Skin/Wound Care: Routine skin care checks   7. Fluids/Electrolytes/Nutrition/aphasia: Routine Is and Os and follow-up chemistries -On modified diet    8: Hypertension: monitor TID and prn; at home on tenoretic             -decrease atenolol to 12.5mg  daily.    6/2 bp still elevated this morning, increase atenolol to 25mg  daily 9: Hyperlipidemia: continue statin, Zetia   10: CAD s/p DES, 2018, 2019             -continue aspirin and statin, Zetia, BB   11: pAF: continue Eliquis and BB   12: DM-2: at home on metformin 1000 mg BID, Humalog 75/25 40 units, Ozempic             -d/c ISS, monitor intake, CBGs QID             -Add carb controlled to dysphagia diet  -placed nursing  order to not give patient any juice or soda  Increase acarbose to 50mg  TID  Increase magnesium gluconate 500g HS  -begin 70/30 insulin bid at 6 units 6/2 increase to 10u bid 13: Tobacco use: cessation counseling; continue Nicoderm   14: Hypothyroidism: continue Synthroid   15:  Overweight: dietary/exercise counseling, increase magneisum gluconate to 500mg  HS   16: Left foot pain: possible non-displaced fracture vs. osseous remolding 5th toe             -continue Darco shoe, buddy wrap  17. Bradycardia: d/c atenolol  18. Hypokalemia: ordered klor once on 5/27, and on 6/3  19. Dizziness: d/c atenolol  20. Right knee buckling/chronic pain: knee sleeve ordered, Zynex Nexwave ordered for outpatient use  22. Nodule on anterior chest wall: nontender, discussed may be lipoma  23. Tachycardia: add toprol XL HS  24. Insomnia: increase trazodone to 75mg  HS  25. HTN: add toprol 12.5mg  XL HS, BP reviewed and is currently under excellent control    LOS: 14 days A FACE TO FACE EVALUATION WAS PERFORMED  Rhemi Balbach P Jaquel Coomer 08/27/2022, 12:07 PM

## 2022-08-27 NOTE — Progress Notes (Signed)
Physical Therapy Session Note  Patient Details  Name: Anthony Woods MRN: [id number] Date of Birth: 05/23/1955  Today's Date: 08/27/2022 PT Individual Time: 1118-1158 PT Individual Time Calculation (min): 40 min   Short Term Goals: Week 2:  PT Short Term Goal 1 (Week 2): STG = LTG due to ELOS  Skilled Therapeutic Interventions/Progress Updates:      Pt sitting in wheelchair on arrival - requesting to use restroom prior to leaving his room. Sit<>stand to RW with CGA. Ambulates with CGA and RW to the bathroom with cues for safety awareness and setup with RW as he wishes to stand to urinate. Pt continent of bladder in standing with CGA for balance as he leans forward and has poor safety awareness. Washed his hands standing at the sink with CGA for balance, leaning against sink with his hips to help stabilize himself.   Pt propelled himself ~122ft to day room rehab gym in wheelchair with supervision assist. Worked on standing balance, standing tolerance, and problem solving for cognition as he played a game of tik-tac-toe with CGA for standing balance. Also played a game while standing on compliant surface (airex pad) with minA for standing balance. Appropriate problem solving and understanding of game instructions.  Finished session with Kinetron at L40cm/sec resistance, sitting on Kinetron with seat belt on. Cues for full AROM bilaterally and increasing cadence to challenge cardiovascular endurance.   Patient returned to his room and ended session in wheelchair with safety belt alarm on and call bell in reach.  Of note, patient c/o R ankle pain (near calcaneous) - on skin assessment, no pressure injury or redness noted, tender to palpation. Will continue to monitor in functional context but didn't appear to affect him during session.     Therapy Documentation Precautions:  Precautions Precautions: Fall Precaution Comments: R sided reduced coordination, R sided weakness and LLE  weakness/reduced motor planning during ambulation Restrictions Weight Bearing Restrictions: No General:      Therapy/Group: Individual Therapy  Nai Borromeo P Masayoshi Couzens  PT, DPT, CSRS  08/27/2022, 11:54 AM

## 2022-08-27 NOTE — Progress Notes (Signed)
Physical Therapy Session Note  Patient Details  Name: Anthony Woods MRN: [id number] Date of Birth: [birth year]  Today's Date: 08/27/2022 PT Individual Time: 1000-1115 PT Individual Time Calculation (min): 75 min   Short Term Goals: Week 2:  PT Short Term Goal 1 (Week 2): STG = LTG due to ELOS  Skilled Therapeutic Interventions/Progress Updates:  Patient greeted sitting upright in wheelchair in his room and agreeable to PT treatment session. Patient wheeled dependently to/from day room for time management and energy conservation.   Patient performed various sit/stands with RW and CGA/MinA for initiating standing- VC for proper hand placement with improved carryover noted with increased repetition.   Patient gait trained x90', x180' with RW and CGA/MinA for improved safety/stability- VC throughout for significantly decreased cadence in order to improve overall stability with increased B step length and foot clearance, staying within the frame of the RW and improved postural extension with forward gaze.   Patient performed x10 sit/stands with RW from lowered mat table and CGA for safety- Green TB wrapped around his hips for resisted sit/stands in order to improve posterior chain activation and strength. Multimodal cues for improved hip extension with each stand with good effort noted.   Patient performed functional gait to/from Nustep (~15') with RW and CGA with good carryover of decreased cadence and minor VC required for increased foot clearance. Patient completed the Nustep x10 minutes on level 5 with B UE/LE.   Patient performed mini sit-ups with a ball toss in order to improve core strength- Performed x15 total with VC and demonstration throughout for improved mechanics.   Patient left sitting upright in wheelchair in his room with posey belt on, call bell within reach and all needs met.    Therapy Documentation Precautions:  Precautions Precautions: Fall Precaution Comments: R sided  reduced coordination, R sided weakness and LLE weakness/reduced motor planning during ambulation Restrictions Weight Bearing Restrictions: No  Pain: Patient reports Rt ankle pain 8/10 with RN notified and pain medication administered.    Therapy/Group: Individual Therapy  Semaje Kinker 08/27/2022, 7:47 AM

## 2022-08-27 NOTE — [place] FL2 (Signed)
[place] MEDICAID FL2 LEVEL OF CARE FORM     IDENTIFICATION  Patient Name: Anthony Woods Birthdate: 1955-06-30 Sex: male Admission Date (Current Location): 08/13/2022  [facility] and [place] Number:  Producer, television/film/video and Address:  [facility] [place], 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/25/2022   Acute embolic stroke (HCC) 08/13/2022   Acute CVA (cerebrovascular accident) (HCC) 08/12/2022   Dyslipidemia 08/12/2022   Noncompliance with medication regimen 08/12/2022   DNR (do not resuscitate) 08/12/2022   Knee strain, right, initial encounter 08/27/2020   Carotid bruit 05/22/2020   COPD (chronic obstructive pulmonary disease) (HCC) 05/22/2020   Neoplasm of uncertain behavior of skin 01/10/2020   Right knee pain 05/01/2019   DOE (dyspnea on exertion)    Chest pain 09/08/2018   Wrist pain, acute, left 04/24/2018   Angina pectoris, unspecified (HCC)    Stress at work 12/15/2016   Tobacco dependence 06/04/2016   Atrial fibrillation with RVR (HCC) 05/12/2016   Elevated troponin    Atrial fibrillation (HCC) 05/11/2016   Allergic rhinitis 08/12/2015   Chest pain with moderate risk of acute coronary syndrome 04/14/2015   Urinary frequency 04/14/2015   Osteoarthritis of right knee 12/09/2014   Snoring 09/25/2013   Rash and nonspecific skin eruption 08/08/2012   Insomnia 08/08/2012   Class 1 obesity due to excess calories with body mass index (BMI) of 34.0 to  34.9 in adult 06/10/2011   Well adult exam 06/10/2011   Cough due to angiotensin-converting enzyme inhibitor 10/08/2010   SHOULDER PAIN 03/09/2010   NECK PAIN 03/09/2010   BRONCHITIS, ACUTE 12/13/2007   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   Hypothyroidism 04/04/2007   Diabetes mellitus type 2 in obese 04/04/2007   Essential hypertension 04/04/2007   Coronary atherosclerosis 04/04/2007   Edema 04/04/2007    Orientation RESPIRATION BLADDER Height & Weight     Self, Time, Situation, Place  Normal Continent Weight: 169 lb 12.1 oz (77 kg) Height:  5\' 6"  (167.6 cm)  BEHAVIORAL SYMPTOMS/MOOD NEUROLOGICAL BOWEL NUTRITION STATUS      Continent Diet (Dys 3 thin liquids)  AMBULATORY STATUS COMMUNICATION OF NEEDS Skin   Limited Assist Verbally Normal                       Personal Care Assistance Level of Assistance  Bathing, Dressing Bathing Assistance: Limited assistance   Dressing Assistance: Limited assistance     Functional Limitations Info  Speech     Speech Info: Impaired    SPECIAL CARE FACTORS FREQUENCY  PT (By licensed PT), OT (By licensed OT), Speech therapy     PT Frequency: 5x week OT Frequency: 5x week     Speech Therapy Frequency: 5x week      Contractures Contractures Info: Not present    Additional Factors Info  Code Status, Allergies Code Status Info: DNR Allergies Info: Bee Venom, Lipitor, Actos and Lisinopril           Current  Medications (08/27/2022):  This is the current hospital active medication list Current Facility-Administered Medications  Medication Dose Route Frequency Provider Last Rate Last Admin   acarbose (PRECOSE) tablet 50 mg  50 mg Oral TID WC Raulkar, Drema Pry, MD   50 mg at 08/27/22 1236   acetaminophen (TYLENOL) tablet 325-650 mg  325-650 mg Oral Q4H PRN [address], [place], [place]   650 mg at 08/27/22 1043   apixaban (ELIQUIS) tablet 5 mg  5 mg Oral BID Milinda Antis, PA-C   5 mg at 08/27/22 5621   aspirin EC tablet  81 mg  81 mg Oral Daily Milinda Antis, PA-C   81 mg at 08/27/22 3086   ezetimibe (ZETIA) tablet 10 mg  10 mg Oral Daily Milinda Antis, PA-C   10 mg at 08/27/22 0739   insulin aspart protamine- aspart (NOVOLOG MIX 70/30) injection 10 Units  10 Units Subcutaneous BID WC Ranelle Oyster, MD   10 Units at 08/27/22 0739   levothyroxine (SYNTHROID) tablet 175 mcg  175 mcg Oral Q0600 Milinda Antis, PA-C   175 mcg at 08/27/22 0539   magnesium gluconate (MAGONATE) tablet 500 mg  500 mg Oral QHS Raulkar, Drema Pry, MD   500 mg at 08/26/22 2209   melatonin tablet 3 mg  3 mg Oral QHS Milinda Antis, PA-C   3 mg at 08/26/22 2209   metFORMIN (GLUCOPHAGE) tablet 1,000 mg  1,000 mg Oral BID WC Erick Colace, MD   1,000 mg at 08/27/22 5784   metoprolol succinate (TOPROL-XL) 24 hr tablet 12.5 mg  12.5 mg Oral Daily Raulkar, Drema Pry, MD   12.5 mg at 08/27/22 0738   nicotine (NICODERM CQ - dosed in mg/24 hours) patch 21 mg  21 mg Transdermal Daily Milinda Antis, PA-C   21 mg at 08/27/22 0738   ondansetron (ZOFRAN) tablet 4 mg  4 mg Oral Q6H PRN Milinda Antis, PA-C       Or   ondansetron [facility]) injection 4 mg  4 mg Intravenous Q6H PRN Setzer, Lynnell Jude, PA-C       polyethylene glycol (MIRALAX / GLYCOLAX) packet 17 g  17 g Oral BID Milinda Antis, PA-C   17 g at 08/26/22 2045   QUEtiapine (SEROQUEL) tablet 50 mg  50 mg Oral QHS Faith Rogue T, MD   50 mg at 08/26/22 2045   rosuvastatin (CRESTOR) tablet 20 mg  20 mg Oral Daily Milinda Antis, PA-C   20 mg at 08/27/22 0739   traZODone (DESYREL) tablet 75 mg  75 mg Oral QHS Horton Chin, MD   75 mg at 08/26/22 2209     Discharge Medications: Please see discharge summary for a list of discharge medications.  Relevant Imaging Results:  Relevant Lab Results:   Additional Information SSN: [id number]  Kurt Azimi, Lemar Livings, LCSW

## 2022-08-27 NOTE — Progress Notes (Signed)
Occupational Therapy Weekly Progress Note  Patient Details  Name: Anthony Woods MRN: [id number] Date of Birth: [birth year]  Beginning of progress report period: August 21, 2022 End of progress report period: August 27, 2022  Today's Date: 08/27/2022 OT Individual Time: 7829-5621 OT Individual Time Calculation (min): 74 min    Patient has met 2 of 4 short term goals but progressing steadily. Pt continues to progress NMRE mainly R sided but B integration, motor planning, graded control as well as vis/perc, cognitive and overall activity tolerance, balance and functional strength. Pt now ambulating with support and cues for ADL's, accessing stall shower with DME and stodd this session for voiding with B UE support. Pt now discherging to SNF for more therapy as wife has medical issues herself and needs pt more independent prior to d/c home. OT in full support of this plan.   Patient continues to demonstrate the following deficits: muscle weakness and muscle joint tightness, decreased cardiorespiratoy endurance, impaired timing and sequencing, unbalanced muscle activation, ataxia, decreased coordination, and decreased motor planning, decreased attention to right and decreased motor planning, decreased attention, decreased awareness, decreased problem solving, decreased safety awareness, decreased memory, and delayed processing, and decreased standing balance, decreased postural control, hemiplegia, and decreased balance strategies and therefore will continue to benefit from skilled OT intervention to enhance overall performance with BADL, iADL, and Reduce care partner burden.  Patient progressing toward long term goals..  Continue plan of care.  OT Short Term Goals Week 2:  OT Short Term Goal 1 (Week 2): Patient to perform toileting with SBA for safety using grab bars OT Short Term Goal 1 - Progress (Week 2): Progressing toward goal OT Short Term Goal 2 (Week 2): Patient to perform LB dressing with set-up  and SBA for safety utilizing DME PRN OT Short Term Goal 2 - Progress (Week 2): Progressing toward goal OT Short Term Goal 3 (Week 2): Patient to perform bathing with SBA for safety cues and DME PRN OT Short Term Goal 3 - Progress (Week 2): Progressing toward goal OT Short Term Goal 4 (Week 2): Patient to display improved BUE coordination by opening containers on meal tray with vc's as needed OT Short Term Goal 4 - Progress (Week 2): Met Week 3:  OT Short Term Goal 1 (Week 3): STGs=LTGs d/t LOS  Skilled Therapeutic Interventions/Progress Updates:  Pt seen for weekly reassessment and retraining this visit for progression of toileting safety, functional mobility and overall motor coordination, planning, control and safety. Pt's brother bedside and present throughout session. Pt amb from w/c to and from bathroom for voiding with CGA and min cues to widen BOS with RW. Stood for toileting to void with CGA incl pants and parts mngt. Amb back out to sink side for hand washing with CGA and min cues to stay within RW space. OT transported pt outdoors for time mngt to address community mobility and for well being. W/c propulsion 3 sets of 50 ft with close S. Short rests in between. Amb 65 ft x 2 from w/c to and from park bench with seated rest wit CGA for RW consistency, BOS, and balance strategies. No LOB this visit even on turns. Pt self propelled back indoors and OT attempted pt to trial w/c skills in gift shop but reported fatigue. Once back in room, VS taken and stable in sitting and standing. Left pt with chair alarm set, needs and nurse call button in reach with brother still present.    Pain:  R ankle and knee pain fluctuates with pain during amb and standing averages 7/10 but reports 0/10 pain with rest. Repositioning, R knee brace, and pain meds provide relieve.   Therapy Documentation Precautions:  Precautions Precautions: Fall Precaution Comments: R sided reduced coordination, R sided weakness  and LLE weakness/reduced motor planning during ambulation Restrictions Weight Bearing Restrictions: No    Therapy/Group: Individual Therapy  Vicenta Dunning 08/27/2022, 8:04 AM

## 2022-08-28 DIAGNOSIS — I639 Cerebral infarction, unspecified: Secondary | ICD-10-CM | POA: Diagnosis not present

## 2022-08-28 LAB — GLUCOSE, CAPILLARY
Glucose-Capillary: 158 mg/dL — ABNORMAL HIGH (ref 70–99)
Glucose-Capillary: 173 mg/dL — ABNORMAL HIGH (ref 70–99)
Glucose-Capillary: 230 mg/dL — ABNORMAL HIGH (ref 70–99)
Glucose-Capillary: 144 mg/dL — ABNORMAL HIGH (ref 70–99)

## 2022-08-28 NOTE — Progress Notes (Signed)
Occupational Therapy Session Note  Patient Details  Name: Anthony Woods MRN: [id number] Date of Birth: February 05, 1956  Today's Date: 08/28/2022 OT Individual Time: 7829-5621 OT Individual Time Calculation (min): 8 min    Short Term Goals: Week 2:  OT Short Term Goal 1 (Week 2): Patient to perform toileting with SBA for safety using grab bars OT Short Term Goal 1 - Progress (Week 2): Progressing toward goal OT Short Term Goal 2 (Week 2): Patient to perform LB dressing with set-up and SBA for safety utilizing DME PRN OT Short Term Goal 2 - Progress (Week 2): Progressing toward goal OT Short Term Goal 3 (Week 2): Patient to perform bathing with SBA for safety cues and DME PRN OT Short Term Goal 3 - Progress (Week 2): Progressing toward goal OT Short Term Goal 4 (Week 2): Patient to display improved BUE coordination by opening containers on meal tray with vc's as needed OT Short Term Goal 4 - Progress (Week 2): Met  Skilled Therapeutic Interventions/Progress Updates: Patient sleeping and stated fatigued upon approach for OT session.  Although he stated he was agreeable to work bed level on UE neuro re education and core stability, he kept falling asleep and was unable to continue working.    He left to rest as she eventually requested supine in bed with bed alarm engaged and call bell within reach.       He will be approach for OT session on his next scheduled date.     Continue OT Plan of Care as tolerable by patient.     Therapy Documentation Precautions:  Precautions Precautions: Fall Precaution Comments: R sided reduced coordination, R sided weakness and LLE weakness/reduced motor planning during ambulation Restrictions Weight Bearing Restrictions: No General: General OT Amount of Missed Time: 67 Minutes  Pain:denied      Therapy/Group: Individual Therapy  [name] [facility] 08/28/2022, 3:30 PM

## 2022-08-28 NOTE — Progress Notes (Signed)
PROGRESS NOTE   Subjective/Complaints: No events overnight.  No acute complaints. Vitals stable Last BM 6/8   Objective: ROS: continued cognitive impairment, +insomnia- improved, knee pain is better controlled  Denies fevers, chills, N/V, abdominal pain, constipation, diarrhea, SOB, cough, chest pain, new weakness or paraesthesias.   No results found. No results for input(s): "WBC", "HGB", "HCT", "PLT" in the last 72 hours.   No results for input(s): "NA", "K", "CL", "CO2", "GLUCOSE", "BUN", "CREATININE", "CALCIUM" in the last 72 hours.    Intake/Output Summary (Last 24 hours) at 08/28/2022 0953 Last data filed at 08/28/2022 0747 Gross per 24 hour  Intake 780 ml  Output 400 ml  Net 380 ml         Physical Exam: Vital Signs Blood pressure 120/76, pulse 82, temperature 98.7 F (37.1 C), temperature source Oral, resp. rate 17, height 5\' 6"  (1.676 m), weight 77 kg, SpO2 91 %. Gen: no distress, normal appearing.  Sitting in bed. HEENT: oral mucosa pink and moist, NCAT Cardio: Reg rate Chest: normal effort, normal rate of breathing Abd: soft, non-distended  Ext: no clubbing, cyanosis, or edema Psych: pleasant and cooperative  Skin: intact Neurologic: Awake, alert, oriented x 3.    Cranial nerves II through XII intact, motor strength is 4/5 in bilateral deltoid, bicep, tricep, grip, 3- bilat hip flexor, knee extensors, ankle dorsiflexor and plantar flexor Sensory exam normal for light touch and pain in all 4 limbs. No limb ataxia or cerebellar signs. No abnormal tone appreciated.   musculoskeletal: Full range of motion in all 4 extremities. No joint swelling   Assessment/Plan: 1. Functional deficits which require 3+ hours per day of interdisciplinary therapy in a comprehensive inpatient rehab setting. Physiatrist is providing close team supervision and 24 hour management of active medical problems listed  below. Physiatrist and rehab team continue to assess barriers to discharge/monitor patient progress toward functional and medical goals  Care Tool:  Bathing    Body parts bathed by patient: Right arm, Left arm, Chest, Abdomen, Front perineal area, Right upper leg, Left upper leg, Face, Buttocks, Left lower leg, Right lower leg   Body parts bathed by helper: Buttocks, Left lower leg, Right lower leg     Bathing assist Assist Level: Contact Guard/Touching assist     Upper Body Dressing/Undressing Upper body dressing   What is the patient wearing?: Pull over shirt    Upper body assist Assist Level: Set up assist    Lower Body Dressing/Undressing Lower body dressing      What is the patient wearing?: Underwear/pull up, Pants     Lower body assist Assist for lower body dressing: Contact Guard/Touching assist     Toileting Toileting    Toileting assist Assist for toileting: Minimal Assistance - Patient > 75% Assistive Device Comment: walker and commode over toilet   Transfers Chair/bed transfer  Transfers assist     Chair/bed transfer assist level: Minimal Assistance - Patient > 75%     Locomotion Ambulation   Ambulation assist      Assist level: Minimal Assistance - Patient > 75% Assistive device: Walker-rolling Max distance: 80   Walk 10 feet activity   Assist  Assist level: Minimal Assistance - Patient > 75% Assistive device: Walker-rolling   Walk 50 feet activity   Assist Walk 50 feet with 2 turns activity did not occur: Safety/medical concerns  Assist level: Minimal Assistance - Patient > 75% Assistive device: Walker-rolling    Walk 150 feet activity   Assist Walk 150 feet activity did not occur: Safety/medical concerns         Walk 10 feet on uneven surface  activity   Assist Walk 10 feet on uneven surfaces activity did not occur: Safety/medical concerns         Wheelchair     Assist Is the patient using a  wheelchair?: Yes Type of Wheelchair: Manual    Wheelchair assist level: Dependent - Patient 0% Max wheelchair distance: 300 ft    Wheelchair 50 feet with 2 turns activity    Assist        Assist Level: Dependent - Patient 0%   Wheelchair 150 feet activity     Assist      Assist Level: Dependent - Patient 0%   Blood pressure 120/76, pulse 82, temperature 98.7 F (37.1 C), temperature source Oral, resp. rate 17, height 5\' 6"  (1.676 m), weight 77 kg, SpO2 91 %.    Medical Problem List and Plan: 1. Functional deficits secondary to left pontine and bilateral thalamic, parietal embolic strokes             -patient may shower             -ELOS/Goals: 10 to 14 days, supervision goals PT/OT/SLP   Grounds pass ordered  -Continue CIR therapies including PT, OT, and SLP   Updated sister   2.  Atrial fibrillation: conitnue Eliquis             -antiplatelet therapy: aspirin    3. Knee pain: Tylenol, Robaxin as needed. Continue knee sleeve.    4. Mood/Behavior/Sleep: LCSW to evaluate and provide emotional support             -continue Lexapro             -antipsychotic agents: n/a   -6/2 night time restlessness and agitation this weekend   -UCX negative   -increase seroquel to 50mg  at 2000   -NEED to keep sleep chart   - 6/8: Sleep chart not appreciated outside of room.  Remains on Seroquel 50 mg at bedtime.  Continue.  5. Neuropsych/cognition: This patient is not capable of making decisions on his own behalf.   6. Skin/Wound Care: Routine skin care checks   7. Fluids/Electrolytes/Nutrition/aphasia: Routine Is and Os and follow-up chemistries -On modified diet    8: Hypertension: monitor TID and prn; at home on tenoretic             -decrease atenolol to 12.5mg  daily.    6/2 bp still elevated this morning, increase atenolol to 25mg  daily 6/8: BP well-controlled.  Monitor.  Vitals:   08/28/22 0849 08/28/22 1301  BP: 120/76 120/67  Pulse: 82 80  Resp:  17   Temp:  97.8 F (36.6 C)  SpO2:  100%    9: Hyperlipidemia: continue statin, Zetia   10: CAD s/p DES, 2018, 2019             -continue aspirin and statin, Zetia, BB   11: pAF: continue Eliquis and BB   12: DM-2: at home on metformin 1000 mg BID, Humalog 75/25 40 units, Ozempic             -  d/c ISS, monitor intake, CBGs QID             -Add carb controlled to dysphagia diet  -placed nursing order to not give patient any juice or soda  Increase acarbose to 50mg  TID  Increase magnesium gluconate 500g HS  -begin 70/30 insulin bid at 6 units 6/2 increase to 10u bid 6/8: well controlled <200; monitor   Recent Labs    08/27/22 1637 08/27/22 2105 08/28/22 0625  GLUCAP 184* 176* 158*     13: Tobacco use: cessation counseling; continue Nicoderm   14: Hypothyroidism: continue Synthroid   15: Overweight: dietary/exercise counseling, increase magneisum gluconate to 500mg  HS   16: Left foot pain: possible non-displaced fracture vs. osseous remolding 5th toe             -continue Darco shoe, buddy wrap  17. Bradycardia: d/c atenolol  18. Hypokalemia: ordered klor once on 5/27, and on 6/3  19. Dizziness: d/c atenolol  20. Right knee buckling/chronic pain: knee sleeve ordered, Zynex Nexwave ordered for outpatient use  22. Nodule on anterior chest wall: nontender, discussed may be lipoma  23. Tachycardia: add toprol XL HS  24. Insomnia: increase trazodone to 75mg  HS  25. HTN: add toprol 12.5mg  XL HS, BP reviewed and is currently under excellent control    LOS: 15 days A FACE TO FACE EVALUATION WAS PERFORMED  Anthony Woods 08/28/2022, 9:53 AM

## 2022-08-29 DIAGNOSIS — I639 Cerebral infarction, unspecified: Secondary | ICD-10-CM | POA: Diagnosis not present

## 2022-08-29 LAB — GLUCOSE, CAPILLARY
Glucose-Capillary: 98 mg/dL (ref 70–99)
Glucose-Capillary: 200 mg/dL — ABNORMAL HIGH (ref 70–99)
Glucose-Capillary: 156 mg/dL — ABNORMAL HIGH (ref 70–99)
Glucose-Capillary: 160 mg/dL — ABNORMAL HIGH (ref 70–99)

## 2022-08-29 NOTE — Progress Notes (Signed)
Physical Therapy Session Note  Patient Details  Name: Anthony Woods MRN: [id number] Date of Birth: [birth year]  Today's Date: 08/29/2022 PT Individual Time: 0803-0912 PT Individual Time Calculation (min): 69 min   Short Term Goals: Week 2:  PT Short Term Goal 1 (Week 2): STG = LTG due to ELOS   Skilled Therapeutic Interventions/Progress Updates:       Pt sitting EOB with NT at the bedside. Patient in agreement to therapy session - no reports of pain.   Assisted with donning pants at EOB - setupA for donning and CGA for balance as he pulled pants over hips in standing with RW support. Pt also able to don shoes while seated EOB without assist.  Ambulatory transfer with CGA and RW to w/c\ and then pt propelled himself with supervision at w/c level to main rehab gym, ~131ft.   Sit<>stand to RW with CGA from w/c with cues needed for hand placement as he wants to pull from RW on first attempt. Gait training 134ft + 242ft (seated rest) with CGA and RW - cues for upright posture, forward gaze, reducing L foot internal rotation, and keeping body within walker frame.   Stair training completed using 6" steps and 2 hand rails - CGA for safely navigating up/down x4 + x4 + x4 steps (seated rest). Worked on progressing from step-to pattern to reciprocal stepping (CGA for step-to pattern, minA for reciprocal pattern).   Assisted onto the Nustep and completed 10 minutes at L6.0 resistance using B UE/LE to target general strengthening and cardiovascular endurance. Patient keeping cadence > 40 steps/minute and achieving 400 steps in total.  Returned to his room and patient concluded session seated in w/c with safety belt alarm on and call bell within reach.   Therapy Documentation Precautions:  Precautions Precautions: Fall Precaution Comments: R sided reduced coordination, R sided weakness and LLE weakness/reduced motor planning during ambulation Restrictions Weight Bearing Restrictions:  No General:    Therapy/Group: Individual Therapy  Varnika Butz P Traylen Eckels  PT, DPT, CSRS  08/29/2022, 7:30 AM

## 2022-08-29 NOTE — Progress Notes (Signed)
PROGRESS NOTE   Subjective/Complaints: No events overnight.  No acute complaints.  Slept 7 hours overnight per sleep chart. Vitals stable Last BM 6/8   Objective: ROS:  +insomnia-resolved knee pain is better controlled  Denies fevers, chills, N/V, abdominal pain, constipation, diarrhea, SOB, cough, chest pain, new weakness or paraesthesias.   No results found. No results for input(s): "WBC", "HGB", "HCT", "PLT" in the last 72 hours.   No results for input(s): "NA", "K", "CL", "CO2", "GLUCOSE", "BUN", "CREATININE", "CALCIUM" in the last 72 hours.    Intake/Output Summary (Last 24 hours) at 08/29/2022 1128 Last data filed at 08/29/2022 0810 Gross per 24 hour  Intake 712 ml  Output 1300 ml  Net -588 ml         Physical Exam: Vital Signs Blood pressure 134/71, pulse 75, temperature 98.2 F (36.8 C), temperature source Oral, resp. rate 18, height 5\' 6"  (1.676 m), weight 77 kg, SpO2 96 %. Gen: no distress, normal appearing.  Sitting in bed, with wife at bedside. HEENT: oral mucosa pink and moist, NCAT Cardio: Reg rate Chest: normal effort, normal rate of breathing Abd: soft, non-distended  Ext: no clubbing, cyanosis, or edema Psych: pleasant and cooperative, joking with wife. Skin: intact Neurologic: Awake, alert, oriented x 3.  Some mild memory and cognitive delay, but overall appropriate.  Cranial nerves II through XII intact, motor strength is 4/5 in bilateral deltoid, bicep, tricep, grip, 3- bilat hip flexor, knee extensors, ankle dorsiflexor and plantar flexor Sensory exam normal for light touch and pain in all 4 limbs. No limb ataxia or cerebellar signs. No abnormal tone appreciated.   musculoskeletal: Full range of motion in all 4 extremities. No joint swelling   Assessment/Plan: 1. Functional deficits which require 3+ hours per day of interdisciplinary therapy in a comprehensive inpatient rehab  setting. Physiatrist is providing close team supervision and 24 hour management of active medical problems listed below. Physiatrist and rehab team continue to assess barriers to discharge/monitor patient progress toward functional and medical goals  Care Tool:  Bathing    Body parts bathed by patient: Right arm, Left arm, Chest, Abdomen, Front perineal area, Right upper leg, Left upper leg, Face, Buttocks, Left lower leg, Right lower leg   Body parts bathed by helper: Buttocks, Left lower leg, Right lower leg     Bathing assist Assist Level: Contact Guard/Touching assist     Upper Body Dressing/Undressing Upper body dressing   What is the patient wearing?: Pull over shirt    Upper body assist Assist Level: Set up assist    Lower Body Dressing/Undressing Lower body dressing      What is the patient wearing?: Underwear/pull up, Pants     Lower body assist Assist for lower body dressing: Contact Guard/Touching assist     Toileting Toileting    Toileting assist Assist for toileting: Minimal Assistance - Patient > 75% Assistive Device Comment: walker and commode over toilet   Transfers Chair/bed transfer  Transfers assist     Chair/bed transfer assist level: Minimal Assistance - Patient > 75%     Locomotion Ambulation   Ambulation assist      Assist level: Minimal Assistance -  Patient > 75% Assistive device: Walker-rolling Max distance: 80   Walk 10 feet activity   Assist     Assist level: Minimal Assistance - Patient > 75% Assistive device: Walker-rolling   Walk 50 feet activity   Assist Walk 50 feet with 2 turns activity did not occur: Safety/medical concerns  Assist level: Minimal Assistance - Patient > 75% Assistive device: Walker-rolling    Walk 150 feet activity   Assist Walk 150 feet activity did not occur: Safety/medical concerns         Walk 10 feet on uneven surface  activity   Assist Walk 10 feet on uneven surfaces  activity did not occur: Safety/medical concerns         Wheelchair     Assist Is the patient using a wheelchair?: Yes Type of Wheelchair: Manual    Wheelchair assist level: Dependent - Patient 0% Max wheelchair distance: 300 ft    Wheelchair 50 feet with 2 turns activity    Assist        Assist Level: Dependent - Patient 0%   Wheelchair 150 feet activity     Assist      Assist Level: Dependent - Patient 0%   Blood pressure 134/71, pulse 75, temperature 98.2 F (36.8 C), temperature source Oral, resp. rate 18, height 5\' 6"  (1.676 m), weight 77 kg, SpO2 96 %.    Medical Problem List and Plan: 1. Functional deficits secondary to left pontine and bilateral thalamic, parietal embolic strokes             -patient may shower             -ELOS/Goals: 10 to 14 days, supervision goals PT/OT/SLP   Grounds pass ordered  -Continue CIR therapies including PT, OT, and SLP   Updated sister   2.  Atrial fibrillation: conitnue Eliquis             -antiplatelet therapy: aspirin    3. Knee pain: Tylenol, Robaxin as needed. Continue knee sleeve.    4. Mood/Behavior/Sleep: LCSW to evaluate and provide emotional support             -continue Lexapro             -antipsychotic agents: n/a   -6/2 night time restlessness and agitation this weekend   -UCX negative   -increase seroquel to 50mg  at 2000   -NEED to keep sleep chart   - 6/9: Sleep chart 7 hours overnight.  Remains on Seroquel 50 mg at bedtime.  Continue.  5. Neuropsych/cognition: This patient is not capable of making decisions on his own behalf.   6. Skin/Wound Care: Routine skin care checks   7. Fluids/Electrolytes/Nutrition/aphasia: Routine Is and Os and follow-up chemistries -On modified diet    8: Hypertension: monitor TID and prn; at home on tenoretic             -decrease atenolol to 12.5mg  daily.    6/2 bp still elevated this morning, increase atenolol to 25mg  daily 6/8: BP well-controlled.   Monitor.  Vitals:   08/28/22 2126 08/29/22 0540  BP: (!) 145/74 134/71  Pulse: 71 75  Resp: 17 18  Temp: 97.6 F (36.4 C) 98.2 F (36.8 C)  SpO2: 100% 96%    9: Hyperlipidemia: continue statin, Zetia   10: CAD s/p DES, 2018, 2019             -continue aspirin and statin, Zetia, BB   11: pAF: continue Eliquis and BB  12: DM-2: at home on metformin 1000 mg BID, Humalog 75/25 40 units, Ozempic             -d/c ISS, monitor intake, CBGs QID             -Add carb controlled to dysphagia diet  -placed nursing order to not give patient any juice or soda  Increase acarbose to 50mg  TID  Increase magnesium gluconate 500g HS  -begin 70/30 insulin bid at 6 units 6/2 increase to 10u bid 6/8-9: well controlled <200; monitor   Recent Labs    08/28/22 2129 08/29/22 0536 08/29/22 1120  GLUCAP 144* 98 200*      13: Tobacco use: cessation counseling; continue Nicoderm   14: Hypothyroidism: continue Synthroid   15: Overweight: dietary/exercise counseling, increase magneisum gluconate to 500mg  HS   16: Left foot pain: possible non-displaced fracture vs. osseous remolding 5th toe             -continue Darco shoe, buddy wrap  17. Bradycardia: d/c atenolol  18. Hypokalemia: ordered klor once on 5/27, and on 6/3  19. Dizziness: d/c atenolol  20. Right knee buckling/chronic pain: knee sleeve ordered, Zynex Nexwave ordered for outpatient use  22. Nodule on anterior chest wall: nontender, discussed may be lipoma  23. Tachycardia: add toprol XL HS-resolved    08/29/2022    1:29 PM 08/29/2022    5:40 AM 08/28/2022    9:26 PM  Vitals with BMI  Systolic 124 134 782  Diastolic 70 71 74  Pulse 71 75 71     24. Insomnia: increase trazodone to 75mg  HS  25. HTN: add toprol 12.5mg  XL HS, BP reviewed and is currently under excellent control    LOS: 16 days A FACE TO FACE EVALUATION WAS PERFORMED  Angelina Sheriff 08/29/2022, 11:28 AM

## 2022-08-30 DIAGNOSIS — I639 Cerebral infarction, unspecified: Secondary | ICD-10-CM | POA: Diagnosis not present

## 2022-08-30 LAB — CBC
HCT: 37.1 % — ABNORMAL LOW (ref 39.0–52.0)
Hemoglobin: 12.6 g/dL — ABNORMAL LOW (ref 13.0–17.0)
MCH: 32 pg (ref 26.0–34.0)
MCHC: 34 g/dL (ref 30.0–36.0)
MCV: 94.2 fL (ref 80.0–100.0)
Platelets: 196 10*3/uL (ref 150–400)
RBC: 3.94 MIL/uL — ABNORMAL LOW (ref 4.22–5.81)
RDW: 11.7 % (ref 11.5–15.5)
WBC: 6.8 10*3/uL (ref 4.0–10.5)
nRBC: 0 % (ref 0.0–0.2)

## 2022-08-30 LAB — BASIC METABOLIC PANEL
Anion gap: 9 (ref 5–15)
BUN: 11 mg/dL (ref 8–23)
CO2: 26 mmol/L (ref 22–32)
Calcium: 9.3 mg/dL (ref 8.9–10.3)
Chloride: 104 mmol/L (ref 98–111)
Creatinine, Ser: 0.84 mg/dL (ref 0.61–1.24)
GFR, Estimated: >60 mL/min (ref >60)
Glucose, Bld: 124 mg/dL — ABNORMAL HIGH (ref 70–99)
Potassium: 3.9 mmol/L (ref 3.5–5.1)
Sodium: 139 mmol/L (ref 135–145)

## 2022-08-30 LAB — GLUCOSE, CAPILLARY
Glucose-Capillary: 126 mg/dL — ABNORMAL HIGH (ref 70–99)
Glucose-Capillary: 203 mg/dL — ABNORMAL HIGH (ref 70–99)
Glucose-Capillary: 164 mg/dL — ABNORMAL HIGH (ref 70–99)
Glucose-Capillary: 159 mg/dL — ABNORMAL HIGH (ref 70–99)

## 2022-08-30 NOTE — Progress Notes (Signed)
Speech Language Pathology Weekly Progress and Session Note  Patient Details  Name: Anthony Woods MRN: [id number] Date of Birth: 01-02-1956  Beginning of progress report period: August 23, 2022 End of progress report period: August 30, 2022  Today's Date: 08/30/2022 SLP Individual Time: 1345-1430 SLP Individual Time Calculation (min): 45 min  Short Term Goals: Week 2: SLP Short Term Goal 1 (Week 2): STGs= LTGs due to ELOS SLP Short Term Goal 1 - Progress (Week 2): Progressing toward goal    New Short Term Goals: Week 3: SLP Short Term Goal 1 (Week 3): STGs= LTGs due to ELOS  Weekly Progress Updates: Pt has made steady gains. Due to expected discharge date changing short term goals were not created. Currently, pt requires min A for speech intelligibility, recall of functional and novel information, and orientation. He also benefits from min A for tolerance of current diet of Dys 3. Pt/ family education ongoing. Pt would benefit from continued skilled SLP intervention to maximize his functional independence prior to discharge.   Intensity: Minumum of 1-2 x/day, 30 to 90 minutes Frequency: 1 to 3 out of 7 days Duration/Length of Stay: TBD due to SNF placement Treatment/Interventions: Cognitive remediation/compensation;Cueing hierarchy;Dysphagia/aspiration precaution training;Environmental controls;Functional tasks;Internal/external aids;Patient/family education;Therapeutic Activities;Speech/Language facilitation   Daily Session  Skilled Therapeutic Interventions: Pt was seen in PM to address cognitive re- training. Pt was alert and seated upright in WC upon SLP arrival. Pt's wife was present and participated intermittently throughout session. SLP challenged pt in recall of novel information. SLP reviewed compensatory strategies of writing information down, repetition, association, and picturing it. SLP challenged pt in recall of moderate level information. SLP guided pt in use of repetition  and association strategies given a 2-3 minute delay pt recalled information with 80% acc indep which did not improve with additional time. In additional minutes of session, SLP addressed problem solving. SLP challenged pt in identifying solutions to problems within current environment. Pt completed task with 75% acc with min A. In missed opportunities, SLP provided correct response and rationale. Pt was left seated upright in WC with call button within reach, chair alarm active, and wife present. SLP to continue POC.       Pain Pain Assessment Pain Scale: 0-10 Pain Score: 0-No pain  Therapy/Group: Individual Therapy  Renaee Munda 08/30/2022, 4:08 PM

## 2022-08-30 NOTE — Progress Notes (Signed)
Physical Therapy Session Note  Patient Details  Name: Anthony Woods MRN: [id number] Date of Birth: Aug 22, 1955  Today's Date: 08/30/2022 PT Individual Time: 0915-1027 PT Individual Time Calculation (min): 72 min   Short Term Goals: Week 2:  PT Short Term Goal 1 (Week 2): STG = LTG due to ELOS  Skilled Therapeutic Interventions/Progress Updates:      Pt in bed to start with MD finishing morning rounds. Patient in agreement to therapy without reports of pain. Supine<>sitting EOB with supervision - assist to remove covers due to decreased awareness. Able to complete UB dressing with setupA as he sat EOB and donned socks/shoes with setupA as well. Pt reporting urge to void - ambulated with CGA and RW from EOB to bathroom - continent of bladder void in standing with CGA for balance. At sink, completed hand hygiene while standing with CGA for balance - cues to reduce leaning on sink with hips. Propelled himself >288ft with supervision at wheelchair level to ortho rehab gym. Completed ambulatory car transfer with CGA and RW - cues for safety approach and avoid grabbing door as he enters/exits the vehicle. While ambulating back to his w/c, patient had x1 LOB that required modA for correction due to R leaning. Pt requesting his neoprene knee sleeve - this was provided for him and he donned without assist while seated in w/c. Completed activities at [facility] system at w/c level to challenge processing, problem solving, and sequencing. Trail making tasks (A & B) completed - min cues for part A and mod cues for part B - patient with difficulty sequencing part B after the 4th letter. Geoboard duplication without memory component - no cues for simple complexity, mod cues for moderate complexity.  Upgraded task with memory component -  max cues needed for this. Next, worked on [employer] with words, 10 second flash time. Able to consistently get up to ~3-4 words without error, > 5 words was very difficult for him. Pt returned  to his room. Safety belt alarm on in wheelchair, all needs met.    Therapy Documentation Precautions:  Precautions Precautions: Fall Precaution Comments: R sided reduced coordination, R sided weakness and LLE weakness/reduced motor planning during ambulation Restrictions Weight Bearing Restrictions: No General:      Therapy/Group: Individual Therapy  Orrin Brigham 08/30/2022, 7:48 AM

## 2022-08-30 NOTE — Progress Notes (Signed)
Occupational Therapy Session Note  Patient Details  Name: Anthony Woods MRN: [id number] Date of Birth: 02/01/1956  Today's Date: 08/30/2022 OT Individual Time: 1120-1205 OT Individual Time Calculation (min): 45 min    Short Term Goals: Week 3:  OT Short Term Goal 1 (Week 3): STGs=LTGs d/t LOS  Skilled Therapeutic Interventions/Progress Updates:    Patient received seated in wheelchair, agreeable to OT session.  Patient indicates need to void and ambulates to bathroom with RW and contact guard assist. Patient stood to urinate, then walked to shower.  Patient indicates knee sleeve helps with pain, but not so much with buckling.  No buckling observed this session.  Patient able to shower and dress himself with set up assistance and supervision.  Patient showing improved speed of movement - slower and more safe than originally.  Patient with delayed motoric response in right hand - yet uses functionally throughout BADL.   Patient left up in wheelchair with safety belt in place and engaged and call bell, personal items in reach.    Therapy Documentation Precautions:  Precautions Precautions: Fall Precaution Comments: R sided reduced coordination, R sided weakness and LLE weakness/reduced motor planning during ambulation Restrictions Weight Bearing Restrictions: No   Pain:  Denies pain      Therapy/Group: Individual Therapy  [name] 08/30/2022, 12:17 PM

## 2022-08-30 NOTE — Progress Notes (Signed)
PROGRESS NOTE   Subjective/Complaints: His sister calls and asks if Kenney has dementia. Discussed mild cognitive impairment Discussed that we are applying to SNFs  Objective: ROS: continued cognitive impairment, +insomnia- improved, knee pain is better controlled   No results found. Recent Labs    08/30/22 0629  WBC 6.8  HGB 12.6*  HCT 37.1*  PLT 196     Recent Labs    08/30/22 0629  NA 139  K 3.9  CL 104  CO2 26  GLUCOSE 124*  BUN 11  CREATININE 0.84  CALCIUM 9.3      Intake/Output Summary (Last 24 hours) at 08/30/2022 0955 Last data filed at 08/30/2022 0806 Gross per 24 hour  Intake 1189 ml  Output 1400 ml  Net -211 ml        Physical Exam: Vital Signs Blood pressure 112/76, pulse 75, temperature 98 F (36.7 C), temperature source Oral, resp. rate 17, height 5\' 6"  (1.676 m), weight 77 kg, SpO2 94 %. Gen: no distress, normal appearing HEENT: oral mucosa pink and moist, NCAT Cardio: Reg rate Chest: normal effort, normal rate of breathing Abd: soft, non-distended  Ext: no clubbing, cyanosis, or edema Psych: pleasant and cooperative  Skin: intact Neurologic: distracted. Oriented to self, month, year. Didn't know where he was. Cranial nerves II through XII intact, motor strength is 4/5 in bilateral deltoid, bicep, tricep, grip, 3- bilat hip flexor, knee extensors, ankle dorsiflexor and plantar flexor Sensory exam normal for light touch and pain in all 4 limbs. No limb ataxia or cerebellar signs. No abnormal tone appreciated.   musculoskeletal: Full range of motion in all 4 extremities. No joint swelling   Assessment/Plan: 1. Functional deficits which require 3+ hours per day of interdisciplinary therapy in a comprehensive inpatient rehab setting. Physiatrist is providing close team supervision and 24 hour management of active medical problems listed below. Physiatrist and rehab team continue to assess  barriers to discharge/monitor patient progress toward functional and medical goals  Care Tool:  Bathing    Body parts bathed by patient: Right arm, Left arm, Chest, Abdomen, Front perineal area, Right upper leg, Left upper leg, Face, Buttocks, Left lower leg, Right lower leg   Body parts bathed by helper: Buttocks, Left lower leg, Right lower leg     Bathing assist Assist Level: Contact Guard/Touching assist     Upper Body Dressing/Undressing Upper body dressing   What is the patient wearing?: Pull over shirt    Upper body assist Assist Level: Set up assist    Lower Body Dressing/Undressing Lower body dressing      What is the patient wearing?: Underwear/pull up, Pants     Lower body assist Assist for lower body dressing: Contact Guard/Touching assist     Toileting Toileting    Toileting assist Assist for toileting: Minimal Assistance - Patient > 75% Assistive Device Comment: walker and commode over toilet   Transfers Chair/bed transfer  Transfers assist     Chair/bed transfer assist level: Minimal Assistance - Patient > 75%     Locomotion Ambulation   Ambulation assist      Assist level: Minimal Assistance - Patient > 75% Assistive device: Walker-rolling Max  distance: 80   Walk 10 feet activity   Assist     Assist level: Minimal Assistance - Patient > 75% Assistive device: Walker-rolling   Walk 50 feet activity   Assist Walk 50 feet with 2 turns activity did not occur: Safety/medical concerns  Assist level: Minimal Assistance - Patient > 75% Assistive device: Walker-rolling    Walk 150 feet activity   Assist Walk 150 feet activity did not occur: Safety/medical concerns         Walk 10 feet on uneven surface  activity   Assist Walk 10 feet on uneven surfaces activity did not occur: Safety/medical concerns         Wheelchair     Assist Is the patient using a wheelchair?: Yes Type of Wheelchair: Manual    Wheelchair  assist level: Dependent - Patient 0% Max wheelchair distance: 300 ft    Wheelchair 50 feet with 2 turns activity    Assist        Assist Level: Dependent - Patient 0%   Wheelchair 150 feet activity     Assist      Assist Level: Dependent - Patient 0%   Blood pressure 112/76, pulse 75, temperature 98 F (36.7 C), temperature source Oral, resp. rate 17, height 5\' 6"  (1.676 m), weight 77 kg, SpO2 94 %.    Medical Problem List and Plan: 1. Functional deficits secondary to left pontine and bilateral thalamic, parietal embolic strokes             -patient may shower             -ELOS/Goals: 10 to 14 days, supervision goals PT/OT/SLP   Grounds pass ordered  Continue CIR therapies including PT, OT, and SLP   Updated sister   2.  Atrial fibrillation: continue Eliquis             -antiplatelet therapy: aspirin    3. Knee pain: Tylenol, Robaxin as needed. Continue knee sleeve.    4. Mood/Behavior/Sleep: LCSW to evaluate and provide emotional support             -continue Lexapro             -antipsychotic agents: n/a   -6/2 night time restlessness and agitation this weekend   -UCX negative   -increase seroquel to 50mg  at 2000   -NEED to keep sleep chart 5. Neuropsych/cognition: This patient is not capable of making decisions on his own behalf.   6. Skin/Wound Care: Routine skin care checks   7. Fluids/Electrolytes/Nutrition/aphasia: Routine Is and Os and follow-up chemistries -On modified diet    8: Hypertension: monitor TID and prn; at home on tenoretic             -decrease atenolol to 12.5mg  daily.    6/2 bp still elevated this morning, increase atenolol to 25mg  daily 9: Hyperlipidemia: continue statin, Zetia   10: CAD s/p DES, 2018, 2019             -continue aspirin and statin, Zetia, BB   11: pAF: continue Eliquis and BB   12: DM-2: at home on metformin 1000 mg BID, Humalog 75/25 40 units, Ozempic             -d/c ISS, monitor intake, CBGs QID              -Add carb controlled to dysphagia diet  -placed nursing order to not give patient any juice or soda  Increase acarbose to 50mg  TID  Increase magnesium gluconate 500g HS  -begin 70/30 insulin bid at 6 units 6/2 increase to 10u bid 13: Tobacco use: cessation counseling; continue Nicoderm   14: Hypothyroidism: continue Synthroid   15: Overweight: dietary/exercise counseling, increase magneisum gluconate to 500mg  HS   16: Left foot pain: possible non-displaced fracture vs. osseous remolding 5th toe             -continue Darco shoe, buddy wrap  17. Bradycardia: d/c atenolol  18. Hypokalemia: ordered klor once on 5/27, and on 6/3  19. Dizziness: d/c atenolol  20. Right knee buckling/chronic pain: knee sleeve ordered, Zynex Nexwave ordered for outpatient use  22. Nodule on anterior chest wall: nontender, discussed may be lipoma  23. Tachycardia: add toprol XL HS  24. Insomnia: increase trazodone to 75mg  HS  25. HTN: add toprol 12.5mg  XL HS, BP reviewed and is currently under excellent control  26. Mild cognitive impairment: discussed Namenda with his sister and she will discuss with her siblings as well    LOS: 17 days A FACE TO FACE EVALUATION WAS PERFORMED  Clint Bolder P Tima Curet 08/30/2022, 9:55 AM

## 2022-08-30 NOTE — Progress Notes (Signed)
Physical Therapy Session Note  Patient Details  Name: Anthony Woods MRN: [id number] Date of Birth: 09-07-1955  Today's Date: 08/30/2022 PT Individual Time: 1432-1526 PT Individual Time Calculation (min): 54 min   Short Term Goals: Week 2:  PT Short Term Goal 1 (Week 2): STG = LTG due to ELOS  Skilled Therapeutic Interventions/Progress Updates:      Patient in w/c with wife at bedside and present throughout session for active observation and family education. Patient denies any pain.  Transported to main rehab gym for time. Discussed home setup, DME rec's, f/u therapies, etc. Worked on facilitating discussion regarding DC plan (SNF vs home). Wife wanting to wait until team conference on Wednesday to help decide the plan.  Patient completed stair training with 6" steps and 2 hand rails (pt has 4-5 STE with 2 HR's) and completed at supervision level with reciprocal stepping for ascent and step-to pattern for descent. Patient with safe and cautious level of awareness while navigating the steps.   Gait training during session short distances, ~40-68ft, with supervision and RW. Discussed primary gait deficits, falls risk, home safety while ambulating, energy conservation, and techniques to reduce distractions.   Car transfer training completed at supervision level with RW and car height replicating their small truck - patient needed only min cues for setup while entering the vehicle but no physical assist required.  Finished session with Nustep x10 minutes at L6 resistance, B LE/UE to work on general strengthening and cardiovascular endurance. Patient achieved 450steps total. Returned to his room and ended session seated in w/c with safety belt alarm on and his wife present.    Therapy Documentation Precautions:  Precautions Precautions: Fall Precaution Comments: R sided reduced coordination, R sided weakness and LLE weakness/reduced motor planning during ambulation Restrictions Weight  Bearing Restrictions: No General:     Therapy/Group: Individual Therapy  Meena Barrantes P Nyja Westbrook  PT, DPT, CSRS  08/30/2022, 10:59 AM

## 2022-08-31 DIAGNOSIS — I639 Cerebral infarction, unspecified: Secondary | ICD-10-CM | POA: Diagnosis not present

## 2022-08-31 LAB — GLUCOSE, CAPILLARY
Glucose-Capillary: 108 mg/dL — ABNORMAL HIGH (ref 70–99)
Glucose-Capillary: 89 mg/dL (ref 70–99)
Glucose-Capillary: 145 mg/dL — ABNORMAL HIGH (ref 70–99)
Glucose-Capillary: 108 mg/dL — ABNORMAL HIGH (ref 70–99)

## 2022-08-31 NOTE — Progress Notes (Signed)
Physical Therapy Weekly Progress Note  Patient Details  Name: Anthony Woods MRN: [id number] Date of Birth: [birth year]  Beginning of progress report period: August 21, 2022 End of progress report period: August 31, 2022  Today's Date: 08/31/2022 PT Individual Time: 0347-4259 + 1415-1500 PT Individual Time Calculation (min): 45 min + 45 min  Patient is making appropriate progress towards LTG of CGA, and is nearing goal level. Family education completed yesterday with his wife, Anthony Woods. Family still unsure if plan is to DC home or short term SNF for further rehabilitation. Patient is primarily limited by his cognitive impairments, generalized weakness/deconditioning, and impaired dynamic standing balance placing him at increased falls risk.   Patient continues to demonstrate the following deficits muscle weakness, decreased cardiorespiratoy endurance, unbalanced muscle activation, decreased initiation, decreased attention, decreased awareness, decreased problem solving, decreased safety awareness, and decreased memory, and decreased standing balance, decreased postural control, hemiplegia, and decreased balance strategies and therefore will continue to benefit from skilled PT intervention to increase functional independence with mobility.  Patient progressing toward long term goals..  Continue plan of care.  PT Short Term Goals Week 3:  PT Short Term Goal 1 (Week 3): STG = LTG due to ELOS  Skilled Therapeutic Interventions/Progress Updates:      1st session: Pt in bed, fully dressed, agreeable to therapy. No reports of pain. Supine<>Sitting EOB without assist. Stand pivot transfer with no AD and CGA for safety while transferring towards his R side - safety cues needed for hand placement.   Transported to main rehab gym for time. Sit<>Stand to RW with supervision from w/c, again needing cues for hand placement to push from arm rests. Gait training during session with CGA to close SBA and RW -  distances varied but ranged from ~50-158ft. In ADL apartment, worked on furniture transfers on low sitting sofa couch and rocker/recliner with RW and supervision. Able to ambulate on carpeted surfaces and around foot of the bed with CGA and RW. In ADL kitchen, instructed patient on practicing reaching and grabbing microwave meals (pt only cooks via microwave and his wife does most of the cooking, per patient).  We also discussed barriers to DC home - pt unsure of specific barriers but reports his "wife doesn't want to take care of him." Discussed with MD during rounding. Assisted onto the [facility] and completed x8 minutes at L20 cm/sec resistance with emphasis on full AROM bilaterally and keeping sufficient cadence to challenge endurance. Returned to his room at end of session and safety belt alarm applied with patient sitting in w/c. All needs met.     2nd session: Pt sitting in w/c to start with sisters and his wife at bedside. Pt in agreement to therapy, no reports of pain.  Transported patient in w/c outside near [facility] to work on community mobility and unlevel gait training in unfamiliar environment. Patient rising to stand with CGA from w/c to RW. Short distance gait training, ~57ft, with CGA and RW. Pt needing minA for x2 minor LOB that occurred when he got distracted. He also needed safety cues as he impulsively lifts RW to go over cracks in the pavement, when it wasn't necessary. Outdoors, we discussed DC planning - home vs SNF - pt unsure of plan and seems to give his family control of decision making. Discussed PT goals, PT POC, etc.   Returned upstairs to CIR floor and assisted onto the Nustep. Completed 9 minutes at L6 resistance, keeping cadence >40 steps/minute and achieving 450 steps  in total. Patient able to complete the full time without a rest break.   Returned to his room and ended session in wheelchair with safety belt alarm on.   Therapy Documentation Precautions:   Precautions Precautions: Fall Precaution Comments: R sided reduced coordination, R sided weakness and LLE weakness/reduced motor planning during ambulation Restrictions Weight Bearing Restrictions: No General:     Therapy/Group: Individual Therapy  Stella Bortle P Ramond Darnell  PT, DPT, CSRS  08/31/2022, 7:49 AM

## 2022-08-31 NOTE — Progress Notes (Signed)
Patient ID: Anthony Woods, male   DOB: 08/29/1955, 67 y.o.   MRN: [id number] Nursing attempted to provide wife and patient education on DM management. Initiated education on oral agents with insulin administration BID. Wife noted she did not need the information as she would "not be giving shots to anyone". She would not be able to monitor the CBGs either. Noted the patient would need to go to a SNF at discharge; she could not manage his care. Team notified of current situation. Pamelia Hoit

## 2022-08-31 NOTE — Progress Notes (Signed)
PROGRESS NOTE   Subjective/Complaints: No new complaints this morning Working with Ephriam Knuckles in gym Continues to have impaired memory, participating well with therapy  Objective: ROS: continued cognitive impairment, +insomnia- improved, knee pain is better controlled   No results found. Recent Labs    08/30/22 0629  WBC 6.8  HGB 12.6*  HCT 37.1*  PLT 196     Recent Labs    08/30/22 0629  NA 139  K 3.9  CL 104  CO2 26  GLUCOSE 124*  BUN 11  CREATININE 0.84  CALCIUM 9.3      Intake/Output Summary (Last 24 hours) at 08/31/2022 1122 Last data filed at 08/31/2022 0739 Gross per 24 hour  Intake 660 ml  Output 1550 ml  Net -890 ml        Physical Exam: Vital Signs Blood pressure 113/70, pulse 65, temperature 98.4 F (36.9 C), resp. rate 17, height 5\' 6"  (1.676 m), weight 77 kg, SpO2 98 %. Gen: no distress, normal appearing HEENT: oral mucosa pink and moist, NCAT Cardio: Reg rate Chest: normal effort, normal rate of breathing Abd: soft, non-distended  Ext: no clubbing, cyanosis, or edema Psych: pleasant and cooperative  Skin: intact Neurologic: distracted. Oriented to self, month, year. Didn't know where he was. Cranial nerves II through XII intact, motor strength is 4/5 in bilateral deltoid, bicep, tricep, grip, 3- bilat hip flexor, knee extensors, ankle dorsiflexor and plantar flexor, impaired memory Sensory exam normal for light touch and pain in all 4 limbs. No limb ataxia or cerebellar signs. No abnormal tone appreciated.   musculoskeletal: Full range of motion in all 4 extremities. No joint swelling   Assessment/Plan: 1. Functional deficits which require 3+ hours per day of interdisciplinary therapy in a comprehensive inpatient rehab setting. Physiatrist is providing close team supervision and 24 hour management of active medical problems listed below. Physiatrist and rehab team continue to assess  barriers to discharge/monitor patient progress toward functional and medical goals  Care Tool:  Bathing    Body parts bathed by patient: Right arm, Left arm, Chest, Abdomen, Front perineal area, Right upper leg, Left upper leg, Face, Buttocks, Left lower leg, Right lower leg   Body parts bathed by helper: Buttocks, Left lower leg, Right lower leg     Bathing assist Assist Level: Supervision/Verbal cueing     Upper Body Dressing/Undressing Upper body dressing   What is the patient wearing?: Pull over shirt    Upper body assist Assist Level: Set up assist    Lower Body Dressing/Undressing Lower body dressing      What is the patient wearing?: Underwear/pull up, Pants     Lower body assist Assist for lower body dressing: Supervision/Verbal cueing     Toileting Toileting    Toileting assist Assist for toileting: Contact Guard/Touching assist Assistive Device Comment: walker and commode over toilet   Transfers Chair/bed transfer  Transfers assist     Chair/bed transfer assist level: Contact Guard/Touching assist     Locomotion Ambulation   Ambulation assist      Assist level: Minimal Assistance - Patient > 75% Assistive device: Walker-rolling Max distance: 80   Walk 10 feet activity  Assist     Assist level: Minimal Assistance - Patient > 75% Assistive device: Walker-rolling   Walk 50 feet activity   Assist Walk 50 feet with 2 turns activity did not occur: Safety/medical concerns  Assist level: Minimal Assistance - Patient > 75% Assistive device: Walker-rolling    Walk 150 feet activity   Assist Walk 150 feet activity did not occur: Safety/medical concerns         Walk 10 feet on uneven surface  activity   Assist Walk 10 feet on uneven surfaces activity did not occur: Safety/medical concerns         Wheelchair     Assist Is the patient using a wheelchair?: Yes Type of Wheelchair: Manual    Wheelchair assist level:  Dependent - Patient 0% Max wheelchair distance: 300 ft    Wheelchair 50 feet with 2 turns activity    Assist        Assist Level: Dependent - Patient 0%   Wheelchair 150 feet activity     Assist      Assist Level: Dependent - Patient 0%   Blood pressure 113/70, pulse 65, temperature 98.4 F (36.9 C), resp. rate 17, height 5\' 6"  (1.676 m), weight 77 kg, SpO2 98 %.    Medical Problem List and Plan: 1. Functional deficits secondary to left pontine and bilateral thalamic, parietal embolic strokes             -patient may shower             -ELOS/Goals: 10 to 14 days, supervision goals PT/OT/SLP   Grounds pass ordered  Continue CIR therapies including PT, OT, and SLP   Updated sister   2.  Atrial fibrillation: continue Eliquis             -antiplatelet therapy: aspirin    3. Knee pain: Tylenol, Robaxin as needed. Continue knee sleeve.    4. Mood/Behavior/Sleep: LCSW to evaluate and provide emotional support             -continue Lexapro             -antipsychotic agents: n/a   -6/2 night time restlessness and agitation this weekend   -UCX negative   -increase seroquel to 50mg  at 2000   -NEED to keep sleep chart 5. Neuropsych/cognition: This patient is not capable of making decisions on his own behalf.   6. Skin/Wound Care: Routine skin care checks   7. Fluids/Electrolytes/Nutrition/aphasia: Routine Is and Os and follow-up chemistries -On modified diet    8: Hypertension: monitor TID and prn; at home on tenoretic             -decrease atenolol to 12.5mg  daily.    6/2 bp still elevated this morning, increase atenolol to 25mg  daily 9: Hyperlipidemia: continue statin, Zetia   10: CAD s/p DES, 2018, 2019             -continue aspirin and statin, Zetia, BB   11: pAF: continue Eliquis and BB   12: DM-2: at home on metformin 1000 mg BID, Humalog 75/25 40 units, Ozempic             -d/c ISS, monitor intake, CBGs QID             -Add carb controlled to dysphagia  diet  -placed nursing order to not give patient any juice or soda  Increase acarbose to 50mg  TID  Increase magnesium gluconate 500g HS  -begin 70/30 insulin bid at 6 units  6/2 increase to 10u bid 13: Tobacco use: cessation counseling; continue Nicoderm   14: Hypothyroidism: continue Synthroid   15: Overweight: dietary/exercise counseling, increase magneisum gluconate to 500mg  HS   16: Left foot pain: possible non-displaced fracture vs. osseous remolding 5th toe             -continue Darco shoe, buddy wrap  17. Bradycardia: d/c atenolol  18. Hypokalemia: ordered klor once on 5/27, and on 6/3  19. Dizziness: d/c atenolol  20. Right knee buckling/chronic pain: knee sleeve ordered, Zynex Nexwave ordered for outpatient use  22. Nodule on anterior chest wall: nontender, discussed may be lipoma  23. Tachycardia: resolved, continue toprol XL HS  24. Insomnia: continue trazodone to 75mg  HS  25. HTN: continue toprol 12.5mg  XL HS, BP reviewed and is currently under excellent control  26. Mild cognitive impairment: discussed Namenda with his sister and she will discuss with her siblings as well, continue SLP    LOS: 18 days A FACE TO FACE EVALUATION WAS PERFORMED  Kealan Buchan P Quanda Pavlicek 08/31/2022, 11:22 AM

## 2022-08-31 NOTE — Progress Notes (Addendum)
Patient ID: Anthony Woods, male   DOB: 10-11-1955, 67 y.o.   MRN: [id number]  This SW covering for primary SW, Becky Dupree.   SW received updates from PT reporting pt and pt wife decided to take discharge to home. SW went by room to follow-up on reports. Through discussion, they both would like to pursue [facility]. Family would like to stay in this area. Wife reports she will have to find someone to stay home with her husband when she has appointments. States she has not found anyone yet. SW dicussed her continuing to explore supports in the event unable to get bed offer at [facility]. SW will provide SNF list.  SW shared this information will be shared with primary SW to discuss further.   SW left message for Tracy/Admissions with [facility] to discuss referral.   SW left SNF list in room.  *[facility] referral declined in Epic Hub.   [id number]-SW called pt wife to inform on above. She states will discuss with pt sisters. SW encouraged her to share updates with [facility].   Cecile Sheerer, MSW, LCSWA Office: [phone] Cell: [phone] Fax: [phone]

## 2022-08-31 NOTE — Progress Notes (Signed)
Speech Language Pathology Daily Session Note  Patient Details  Name: Anthony Woods MRN: [id number] Date of Birth: [birth year]  Today's Date: 08/31/2022 SLP Individual Time: 0715-0814 SLP Individual Time Calculation (min): 59 min  Short Term Goals: Week 2: SLP Short Term Goal 1 (Week 2): STGs= LTGs due to ELOS SLP Short Term Goal 1 - Progress (Week 2): Progressing toward goal  Skilled Therapeutic Interventions:   Skilled ST provided targeting cognition goals. Pt alert and lying in bed upon SLP arrival. Denied pain. Oriented to year, place, and situation. Pt did not recall memory strategies discussed in previous session so SLP provided reeducation. SLP facilitated recall of target information to increase pt's orientation and knowledge of therapy services he is receiving at [facility]. Pt able to use external memory aid (board) to orient himself to his room number and current date with 100% accuracy after initial instruction. Pt able to recall what PT/OT/ST are on his schedule with 70% acc when provided min VC. Pt stated concern for having more strokes; SLP provided education on importance of taking his medications, particularly his Xarelto. Facilitated creation of association for pt to recall name and purpose of this medication and he able to recall this information with 80% acc when provided min-mod VC.   Pt reported he needed to use the bathroom and SLP provided min A. Pt with good problem solving noted, stating he needed his walker and that he needed to wear his shoes to reduce the risk of falls.   Pt left with call bell in reach and bed alarm activated. Continue ST POC.   Pain Pain Assessment Pain Scale: 0-10 Pain Score: 0-No pain Faces Pain Scale: No hurt  Therapy/Group: Individual Therapy  Alphonsus Sias 08/31/2022, 10:35 AM

## 2022-08-31 NOTE — Progress Notes (Signed)
Occupational Therapy Session Note  Patient Details  Name: Anthony Woods MRN: [id number] Date of Birth: 20-Dec-1955  Today's Date: 08/31/2022 OT Individual Time: 1102-1200 OT Individual Time Calculation (min): 58 min    Short Term Goals: Week 3:  OT Short Term Goal 1 (Week 3): STGs=LTGs d/t LOS  Skilled Therapeutic Interventions/Progress Updates:    Patient received seated in wheelchair.  Wife at bedside.  Caregiver education to address bathing, dressing, toileting, safety, functional mobility in home setting.  Discussed recommendations for supervision initially - problem solved through if wife has MD appointment - take patient with her, or find someone to stay with him.  Discussed multiple options for potential caregivers including close friends and family.   Discussed sleeping arrangements and need to void during the night.  Patient able to transfer from bed to bedside commode (drop arm) without physical assistance.   Showed resource for suction cup grab bars for his shower.  Patient has shower seat from wife's previous admission.  Informed Social Worker that patient could benefit from a drop arm commode.   Ambulated with patient to bathroom for continent standing void.  Patient walking with supervision.  Wife present.   Left up in wheelchair with safety belt in place and alarm engaged, personal items and call bel in reach, and wife at bedside.    Therapy Documentation Precautions:  Precautions Precautions: Fall Precaution Comments: R sided reduced coordination, R sided weakness and LLE weakness/reduced motor planning during ambulation Restrictions Weight Bearing Restrictions: No  Pain: Pain Assessment Pain Scale: 0-10 Pain Score: 0-No pain Faces Pain Scale: No hurt       Therapy/Group: Individual Therapy  [name] 08/31/2022, 12:17 PM

## 2022-09-01 ENCOUNTER — Other Ambulatory Visit: Payer: Self-pay | Admitting: Physical Medicine and Rehabilitation

## 2022-09-01 DIAGNOSIS — I639 Cerebral infarction, unspecified: Secondary | ICD-10-CM | POA: Diagnosis not present

## 2022-09-01 LAB — GLUCOSE, CAPILLARY
Glucose-Capillary: 102 mg/dL — ABNORMAL HIGH (ref 70–99)
Glucose-Capillary: 142 mg/dL — ABNORMAL HIGH (ref 70–99)
Glucose-Capillary: 195 mg/dL — ABNORMAL HIGH (ref 70–99)
Glucose-Capillary: 204 mg/dL — ABNORMAL HIGH (ref 70–99)

## 2022-09-01 MED ORDER — FREESTYLE LIBRE 3 READER DEVI: 1.0000 | Freq: Every day | 3 refills | Status: AC

## 2022-09-01 MED ORDER — LINAGLIPTIN 5 MG PO TABS
5.0000 mg | ORAL_TABLET | Freq: Every day | ORAL | Status: DC
  Administered 2022-09-01 – 2022-09-03 (×3): 5 mg via ORAL
  Filled 2022-09-01 (×3): qty 1

## 2022-09-01 MED ORDER — FREESTYLE LIBRE 3 SENSOR MISC: 1.0000 | Freq: Every day | 3 refills | Status: DC

## 2022-09-01 NOTE — Progress Notes (Signed)
Occupational Therapy Session Note  Patient Details  Name: Anthony Woods MRN: [id number] Date of Birth: 1955-06-24  Today's Date: 09/01/2022 OT Individual Time: 7829-5621 OT Individual Time Calculation (min): 45 min    Short Term Goals: Week 3:  OT Short Term Goal 1 (Week 3): STGs=LTGs d/t LOS  Skilled Therapeutic Interventions/Progress Updates:    Patient received seated in wheelchair.  Transported to gym to address coordination, functional mobility, attention, organizational skills and discharge planning. Patient needing min cues - but frequent to sort deck of cards, for error recognition, for error correction, and for general attention to task while standing.  Patient tolerated standing - mildly dynamic standing weight shifting slightly left/right x 5 min without loss of balance.  Patient better able to recognize and acknowledge deficits and strategies to address - e.g. "I cannot cut up when I am walking or I lose my balance."  This is a significant improvement from admission.   Patient and sister, OT and MD discharge planning to overcome obstacles to home (vs SNF) discharge. Working to secure home discharge. Patient returned to room, left up in wheelchair with safety belt in place and engaged and call bell/ personal items in reach.    Therapy Documentation Precautions:  Precautions Precautions: Fall Precaution Comments: R sided reduced coordination, R sided weakness and LLE weakness/reduced motor planning during ambulation Restrictions Weight Bearing Restrictions: No   Pain:  Reports "just regular" pain in RLE     Therapy/Group: Individual Therapy  [name] 09/01/2022, 12:58 PM

## 2022-09-01 NOTE — Progress Notes (Signed)
Occupational Therapy Session Note  Patient Details  Name: Anthony Woods MRN: [id number] Date of Birth: Jul 12, 1955  Today's Date: 09/01/2022 OT Individual Time: 1131-1159 OT Individual Time Calculation (min): 28 min    Short Term Goals: Week 3:  OT Short Term Goal 1 (Week 3): STGs=LTGs d/t LOS  Skilled Therapeutic Interventions/Progress Updates:   Patient received seated in wheelchair, excited that plan is again to return home with wife.  Patient showing improved awareness of deficits and verbalizes changes once home - e.g. eating healthier foods, using walker, moving slower.  Patient walked to bathroom with walker and close supervision.  Stands to urinate at toilet.  Patient able to navigate in room to manage doors, cupboards, dresser drawers.  Patient able to reach to floor safely in standing using one hand in solid support.  Patient able to obtain clothing for tomorrow with minimal cueing and without physical assistance.   Patient left up in wheelchair with safety belt in place and engaged.  Call bell and personal items in reach.    Therapy Documentation Precautions:  Precautions Precautions: Fall Precaution Comments: R sided reduced coordination, R sided weakness and LLE weakness/reduced motor planning during ambulation Restrictions Weight Bearing Restrictions: No   Pain:  Denies pain   Therapy/Group: Individual Therapy  [name] 09/01/2022, 3:20 PM

## 2022-09-01 NOTE — Progress Notes (Addendum)
Patient ID: Anthony Woods, male   DOB: 20-Mar-1956, 67 y.o.   MRN: [id number]  Spoke with pt and wife separately to discuss plan and pt progressing in his therapies to reaching supervision level. Discussed with wife issue of insulin she reports she can do this and pt feels he can if given the chance. MD can place on oral agent also. Discussed the insurance he has does not cover prescriptions and he needs to get a supplement or sign up for a Medicare advantage plan. Wife will follow up with this since she has health [employer]. He needs to be on whatever is cheaper for him since not covered at this time. Sister-Carol also concerned and contacted this worker. Wife's only concern is the procedure she has 6/24 pt can not be left alone and she is aware of this. Discussed even he goes to a NH he will not be there long and will be discharged home. Will discuss in team conference today  11:33 AM Spoke with Carol-sister and wife via telephone to inform of team conference update goals of supervision. Plan to go home with wife who feels prepared for this. MD has placed on oral meds versus insulin. Main issue is signing up for prescription plan either medicare part D, supplement or a managed medicare program like wife is already on. Wife to look into today she has [employer]. Work on discharge needs.

## 2022-09-01 NOTE — Patient Care Conference (Signed)
Inpatient RehabilitationTeam Conference and Plan of Care Update Date: 09/01/2022   Time: 11:11 AM    Patient Name: Anthony Woods      Medical Record Number: [id number]  Date of Birth: [birth year] Sex: Male         Room/Bed: 4W16C/4W16C-01 Payor Info: Payor: MEDICARE / Plan: MEDICARE PART A AND B / Product Type: *No Product type* /    Admit Date/Time:  08/13/2022  1:17 PM  Primary Diagnosis:  Acute embolic stroke [facility])  Hospital Problems: Principal Problem:   Acute embolic stroke [facility]) Active Problems:   Mild neurocognitive disorder due to another medical condition    Expected Discharge Date: Expected Discharge Date: 09/03/22  Team Members Present: Physician leading conference: Dr. Sula Soda Social Worker Present: Dossie Der, LCSW Nurse Present: Chana Bode, RN PT Present: Wynelle Link, PT OT Present: Bretta Bang, OT SLP Present: Pablo Lawrence, SLP PPS Coordinator present : Fae Pippin, SLP     Current Status/Progress Goal Weekly Team Focus  Bowel/Bladder      Continent of bowel and bladder          Swallow/Nutrition/ Hydration   Dys. 3 textures with thin liquids, Mod I   Mod I  Goal Met- baseline diet    ADL's   supervision cueing/ contact guard   Supervision/ set up   discharge planning,    Mobility   Nearing goal level. Mod I bed mobility, CGA sit<>stand and stand pivot transfers using the RW, CGA to close supervision ambulation 153ft with RW, CGA for stairs using 2 hand rails.   supervision/ CGA overall  DC planning, family education/training    Communication   80% intelligibile at the sentence level with Min verbal cues   Supervision A   recall and use of speech intelligibility strategies during formal and informal speech tasks    Safety/Cognition/ Behavioral Observations  Min-Mod A   supervision to min A   recall and use of memory strategies, selective attention and complex problem solving with functional tasks    Pain      N/a           Skin      N/a           Discharge Planning:  Wife has been here and attended therapies with pt and can see his progress. he is getting to the level he is too high to go to a NH. Main issue is wife can manage him at home with her own health issues. Concern over insulin but wife reports not an issue. Needs a supplement insurance or Medicare advantage plan for prescription coverage. Discuss at team conference   Team Discussion: Patient has exceeded goals for discharge but defers to humor to cover for deficits post CVA. Patient on target to meet rehab goals: yes, currently needs CGA - supervision for mobility due to loss of balance at times. Able to ambulate up to 150' with CGA assist.  Needs SBA without an assistive device for standing.  Requires min verbal cues and continues to work on complex problem solving. Able to complete bathing and dressing with supervision.   *See Care Plan and progress notes for long and short-term goals.   Revisions to Treatment Plan:  N/A   Teaching Needs: Safety, medications, dietary modification, transfers, etc.   Current Barriers to Discharge: Home enviroment access/layout and Lack of/limited family support  Possible Resolutions to Barriers: Family education completed 08/30/22 and 08/31/22 DME: [employer] [facility]     Medical Summary Current Status:  type 2 diabetes  Barriers to Discharge: Medical stability  Barriers to Discharge Comments: type 2 diabetes, caregiver education Possible Resolutions to Barriers/Weekly Focus: added Januvia, continue to monitor blood sugars, continue acarbose, discussed restarting Ozempic at home, provided dietary education to family members   Continued Need for Acute Rehabilitation Level of Care: The patient requires daily medical management by a physician with specialized training in physical medicine and rehabilitation for the following reasons: Direction of a multidisciplinary physical rehabilitation program to maximize  functional independence : Yes Medical management of patient stability for increased activity during participation in an intensive rehabilitation regime.: Yes Analysis of laboratory values and/or radiology reports with any subsequent need for medication adjustment and/or medical intervention. : Yes   I attest that I was present, lead the team conference, and concur with the assessment and plan of the team.   Chana Bode B 09/01/2022, 2:38 PM

## 2022-09-01 NOTE — Discharge Instructions (Addendum)
Inpatient Rehab Discharge Instructions  Stacy Sorby Cobert Discharge date and time: 09/03/2022   Activities/Precautions/ Functional Status: Activity: no lifting, driving, or strenuous exercise until cleared by MD Diet: diabetic diet; carbohydrate modified Wound Care: none needed Functional status:  ___ No restrictions     ___ Walk up steps independently __x_ 24/7 supervision/assistance   ___ Walk up steps with assistance ___ Intermittent supervision/assistance  ___ Bathe/dress independently ___ Walk with walker     ___ Bathe/dress with assistance ___ Walk Independently    ___ Shower independently ___ Walk with assistance    _x__ Shower with assistance _x__ No alcohol     ___ Return to work/school ________  Special Instructions: No driving, alcohol consumption or tobacco use.  Recommend checking fingerstick blood sugars four (4) times daily and record. Bring this information with you to follow-up appointment with PCP.  Recommend daily BP measurement in same arm and record time of day. Bring this information with you to follow-up appointment with PCP.   COMMUNITY REFERRALS UPON DISCHARGE:    Home Health:   PT   OT   SP RN                  Agency:[facility] Phone: [phone]  Medical Equipment/Items Ordered: 3 IN 1                                                  Agency/Supplier: [facility]   [phone]  WIFE TO PURSUE GETTING HUSBAND SIGNED FOR MANAGED CARE MEDICARE SO WILL HAVE PRESCRIPTION COVERAGE   STROKE/TIA DISCHARGE INSTRUCTIONS SMOKING Cigarette smoking nearly doubles your risk of having a stroke & is the single most alterable risk factor  If you smoke or have smoked in the last 12 months, you are advised to quit smoking for your health. Most of the excess cardiovascular risk related to smoking disappears within a year of stopping. Ask you doctor about anti-smoking medications [place] Quit Line: 1-800-QUIT NOW Free Smoking Cessation Classes [phone]   CHOLESTEROL Know your levels; limit fat & cholesterol in your diet  Lipid Panel     Component Value Date/Time   CHOL 197 08/13/2022 0700   CHOL 129 09/04/2020 0852   TRIG 109 08/13/2022 0700   TRIG 219 (HH) 02/08/2006 0757   HDL 47 08/13/2022 0700   HDL 47 09/04/2020 0852   CHOLHDL 4.2 08/13/2022 0700   VLDL 22 08/13/2022 0700   LDLCALC 128 (H) 08/13/2022 0700   LDLCALC 59 09/04/2020 0852     Many patients benefit from treatment even if their cholesterol is at goal. Goal: Total Cholesterol (CHOL) less than 160 Goal:  Triglycerides (TRIG) less than 150 Goal:  HDL greater than 40 Goal:  LDL (LDLCALC) less than 100   BLOOD PRESSURE American Stroke Association blood pressure target is less that 120/80 mm/Hg  Your discharge blood pressure is:  BP: 122/78 Monitor your blood pressure Limit your salt and alcohol intake Many individuals will require more than one medication for high blood pressure  DIABETES (A1c is a blood sugar average for last 3 months) Goal HGBA1c is under 7% (HBGA1c is blood sugar average for last 3 months)  Diabetes: Diagnosis of diabetes:  Your A1c:15.5 %    Lab Results  Component Value Date   HGBA1C >15.5 (H) 08/11/2022    Your HGBA1c can be lowered  with medications, healthy diet, and exercise. Check your blood sugar as directed by your physician Call your physician if you experience unexplained or low blood sugars.  PHYSICAL ACTIVITY/REHABILITATION Goal is 30 minutes at least 4 days per week  Activity: Increase activity slowly, Therapies: Physical Therapy: Home Health, Occupational Therapy: Home Health, and Speech Therapy: Home Health Return to work: when cleared by MD Activity decreases your risk of heart attack and stroke and makes your heart stronger.  It helps control your weight and blood pressure; helps you relax and can improve your mood. Participate in a regular exercise program. Talk with your doctor about the best form of exercise for you  (dancing, walking, swimming, cycling).  DIET/WEIGHT Goal is to maintain a healthy weight  Your discharge diet is:  Diet Order             DIET DYS 3 Fluid consistency: Thin  Diet effective now                  thin liquids Your height is:  Height: 5\' 6"  (167.6 cm) Your current weight is: Weight: 77.8 kg Your Body Mass Index (BMI) is:  BMI (Calculated): 27.7 Following the type of diet specifically designed for you will help prevent another stroke. Your goal weight range is:   Your goal Body Mass Index (BMI) is 19-24. Healthy food habits can help reduce 3 risk factors for stroke:  High cholesterol, hypertension, and excess weight.  RESOURCES Stroke/Support Group:  Call [phone]   STROKE EDUCATION PROVIDED/REVIEWED AND GIVEN TO PATIENT Stroke warning signs and symptoms How to activate emergency medical system (call 911). Medications prescribed at discharge. Need for follow-up after discharge. Personal risk factors for stroke. Pneumonia vaccine given: No Flu vaccine given: No My questions have been answered, the writing is legible, and I understand these instructions.  I will adhere to these goals & educational materials that have been provided to me after my discharge from the hospital.     My questions have been answered and I understand these instructions. I will adhere to these goals and the provided educational materials after my discharge from the hospital.  Patient/Caregiver Signature _______________________________ Date __________  Clinician Signature _______________________________________ Date __________  Please bring this form and your medication list with you to all your follow-up doctor's appointments.

## 2022-09-01 NOTE — Progress Notes (Signed)
Physical Therapy Session Note  Patient Details  Name: Anthony Woods MRN: [id number] Date of Birth: 02-12-1956  Today's Date: 09/01/2022 PT Individual Time: 9629-5284 + 1324-4010 PT Individual Time Calculation (min): 57 min  + 70 min  Short Term Goals: Week 3:  PT Short Term Goal 1 (Week 3): STG = LTG due to ELOS  Skilled Therapeutic Interventions/Progress Updates:      1st session: Pt in bed sleeping - fully dressed and ate 100% of his breakfast. No reports of pain, just feels sleepy.  Supine<>Sitting EOB without assist with HOB slightly elevated. Able to don L knee sleve with setupA and don's tennis shoes with setupA as well.  Sit<>stand to RW with close supervision with cues for hand placement. Pt ambulates to bathroom with CGA and RW - poor safety awareness while in the bathroom, abandoning RW and misjudging distance to the wall to lean his hand against to brace himself for support as he urinates while standing. Continent of bladder void. Ambulates sinkside with CGA and RW and completes hand washing in standing.  Propels himself in w/c with supervision assist to main rehab gym, ~19ft. Assisted onto the mat table with CGA stand pivot transfer.  Active warm up of repeated sit<>stands, 2x10, from lowered mat table height. Patient relies on back of legs to stabilize and push himself up to stand - worked on progressing away from this but difficult for him.   Standing shoulder raises with 5lb [place], 2x10 - CGA for safety and x1 LOB that resulted in quick sitting on the mat table - LOB occurred due to weight of Tidal Tank pulling him forward and insufficient trunk control to maintain standing balance.  Standing chop/lifts and dynamic reversals with 6lb med ball and CGA for balance - 2x10 each direction  Pt propelled himself back to his room at supervision level in wheelchair - safety belt alarm on and call bell in reach. All needs met at end.    2nd session: Pt sitting in w/c to  start and agreeable to therapy. Denies pain. He propels himself with supervision in w/c to day room rehab gym, ~124ft. Gait training 153ft with CGA and RW around nurses station with cues for keeping body within walker frame as he becomes fatigued. Patient tends to scissor his legs with turns and benefits from cues for widening BOS to prevent LOB. Worked on standing tolerance and balance at high/low table - completing mildly complex puzzle making and playing games a [place] - incorporating bimanual task and challenging selective attention in busy gym environment. Pt reporting urgent need to void when in gym - returned to room and completed short distance ambulation with minA and no AD to the bathroom and pt continent of bladder void in standing. Returned to rehab gym and assisted onto the Nustep - completed x7 minutes at L6.0 resistance with B LE/UE with cadence >40 steps throughout task. Ended session in room with safety belt alarm on, call bell in reach.     Therapy Documentation Precautions:  Precautions Precautions: Fall Precaution Comments: R sided reduced coordination, R sided weakness and LLE weakness/reduced motor planning during ambulation Restrictions Weight Bearing Restrictions: No General:     Therapy/Group: Individual Therapy  Orrin Brigham 09/01/2022, 7:55 AM

## 2022-09-01 NOTE — Progress Notes (Signed)
PROGRESS NOTE   Subjective/Complaints: No new complaints this morning Wife does not feel comfortable administering insulin. Ordered Januvia instead  Objective: ROS: continued cognitive impairment, +insomnia- improved, knee pain is better controlled   No results found. Recent Labs    08/30/22 0629  WBC 6.8  HGB 12.6*  HCT 37.1*  PLT 196     Recent Labs    08/30/22 0629  NA 139  K 3.9  CL 104  CO2 26  GLUCOSE 124*  BUN 11  CREATININE 0.84  CALCIUM 9.3      Intake/Output Summary (Last 24 hours) at 09/01/2022 1618 Last data filed at 09/01/2022 1336 Gross per 24 hour  Intake 680 ml  Output 2100 ml  Net -1420 ml        Physical Exam: Vital Signs Blood pressure 129/84, pulse 71, temperature 98.6 F (37 C), resp. rate 17, height 5\' 6"  (1.676 m), weight 77 kg, SpO2 99 %. Gen: no distress, normal appearing HEENT: oral mucosa pink and moist, NCAT Cardio: Reg rate Chest: normal effort, normal rate of breathing Abd: soft, non-distended  Ext: no clubbing, cyanosis, or edema Psych: pleasant and cooperative  Skin: intact Neurologic: distracted. Oriented to self, month, year. Didn't know where he was. Good sense of humor. Cranial nerves II through XII intact, motor strength is 4/5 in bilateral deltoid, bicep, tricep, grip, 3- bilat hip flexor, knee extensors, ankle dorsiflexor and plantar flexor, impaired memory Sensory exam normal for light touch and pain in all 4 limbs. No limb ataxia or cerebellar signs. No abnormal tone appreciated.   musculoskeletal: Full range of motion in all 4 extremities. No joint swelling   Assessment/Plan: 1. Functional deficits which require 3+ hours per day of interdisciplinary therapy in a comprehensive inpatient rehab setting. Physiatrist is providing close team supervision and 24 hour management of active medical problems listed below. Physiatrist and rehab team continue to assess  barriers to discharge/monitor patient progress toward functional and medical goals  Care Tool:  Bathing    Body parts bathed by patient: Right arm, Left arm, Chest, Abdomen, Front perineal area, Right upper leg, Left upper leg, Face, Buttocks, Left lower leg, Right lower leg   Body parts bathed by helper: Buttocks, Left lower leg, Right lower leg     Bathing assist Assist Level: Supervision/Verbal cueing     Upper Body Dressing/Undressing Upper body dressing   What is the patient wearing?: Pull over shirt    Upper body assist Assist Level: Independent with assistive device    Lower Body Dressing/Undressing Lower body dressing      What is the patient wearing?: Underwear/pull up, Pants     Lower body assist Assist for lower body dressing: Independent with assitive device     Toileting Toileting    Toileting assist Assist for toileting: Independent with assistive device Assistive Device Comment: walker and commode over toilet   Transfers Chair/bed transfer  Transfers assist     Chair/bed transfer assist level: Supervision/Verbal cueing     Locomotion Ambulation   Ambulation assist      Assist level: Minimal Assistance - Patient > 75% Assistive device: Walker-rolling Max distance: 80   Walk 10 feet  activity   Assist     Assist level: Minimal Assistance - Patient > 75% Assistive device: Walker-rolling   Walk 50 feet activity   Assist Walk 50 feet with 2 turns activity did not occur: Safety/medical concerns  Assist level: Minimal Assistance - Patient > 75% Assistive device: Walker-rolling    Walk 150 feet activity   Assist Walk 150 feet activity did not occur: Safety/medical concerns         Walk 10 feet on uneven surface  activity   Assist Walk 10 feet on uneven surfaces activity did not occur: Safety/medical concerns         Wheelchair     Assist Is the patient using a wheelchair?: Yes Type of Wheelchair: Manual     Wheelchair assist level: Dependent - Patient 0% Max wheelchair distance: 300 ft    Wheelchair 50 feet with 2 turns activity    Assist        Assist Level: Dependent - Patient 0%   Wheelchair 150 feet activity     Assist      Assist Level: Dependent - Patient 0%   Blood pressure 129/84, pulse 71, temperature 98.6 F (37 C), resp. rate 17, height 5\' 6"  (1.676 m), weight 77 kg, SpO2 99 %.    Medical Problem List and Plan: 1. Functional deficits secondary to left pontine and bilateral thalamic, parietal embolic strokes             -patient may shower             -ELOS/Goals: 10 to 14 days, supervision goals PT/OT/SLP   Grounds pass ordered  Continue CIR therapies including PT, OT, and SLP   Updated sister   2.  Atrial fibrillation: continue Eliquis             -antiplatelet therapy: aspirin    3. Knee pain: Tylenol, Robaxin as needed. Continue knee sleeve.    4. Mood/Behavior/Sleep: LCSW to evaluate and provide emotional support             -continue Lexapro             -antipsychotic agents: n/a   -6/2 night time restlessness and agitation this weekend   -UCX negative   -increase seroquel to 50mg  at 2000   -NEED to keep sleep chart 5. Neuropsych/cognition: This patient is not capable of making decisions on his own behalf.   6. Skin/Wound Care: Routine skin care checks   7. Fluids/Electrolytes/Nutrition/aphasia: Routine Is and Os and follow-up chemistries -On modified diet    8: Hypertension: monitor TID and prn; at home on tenoretic             -decrease atenolol to 12.5mg  daily.    6/2 bp still elevated this morning, increase atenolol to 25mg  daily 9: Hyperlipidemia: continue statin, Zetia   10: CAD s/p DES, 2018, 2019             -continue aspirin and statin, Zetia, BB   11: pAF: continue Eliquis and BB   12: DM-2: at home on metformin 1000 mg BID, Humalog 75/25 40 units, Ozempic             -d/c ISS, monitor intake, CBGs QID             -Add  carb controlled to dysphagia diet  -placed nursing order to not give patient any juice or soda  Increase acarbose to 50mg  TID  Increase magnesium gluconate 500g HS  -begin 70/30 insulin bid  at 6 units 6/2 increase to 10u bid 13: Tobacco use: cessation counseling; continue Nicoderm   14: Hypothyroidism: continue Synthroid   15: Overweight: dietary/exercise counseling, increase magneisum gluconate to 500mg  HS   16: Left foot pain: possible non-displaced fracture vs. osseous remolding 5th toe             -continue Darco shoe, buddy wrap  17. Bradycardia: d/c atenolol  18. Hypokalemia: ordered klor once on 5/27, and on 6/3  19. Dizziness: d/c atenolol  20. Right knee buckling/chronic pain: knee sleeve ordered, Zynex Nexwave ordered for outpatient use  22. Nodule on anterior chest wall: nontender, discussed may be lipoma  23. Tachycardia: resolved, continue toprol XL HS  24. Insomnia: continue trazodone to 75mg  HS  25. HTN: continue toprol 12.5mg  XL HS, BP reviewed and is currently under excellent control  26. Mild cognitive impairment: discussed Namenda with his sister and she will discuss with her siblings as well, continue SLP, discussed that he could have early stages of vascular dementia    LOS: 19 days A FACE TO FACE EVALUATION WAS PERFORMED  Clint Bolder P Talor Cheema 09/01/2022, 4:18 PM

## 2022-09-02 LAB — GLUCOSE, CAPILLARY
Glucose-Capillary: 102 mg/dL — ABNORMAL HIGH (ref 70–99)
Glucose-Capillary: 175 mg/dL — ABNORMAL HIGH (ref 70–99)
Glucose-Capillary: 177 mg/dL — ABNORMAL HIGH (ref 70–99)
Glucose-Capillary: 181 mg/dL — ABNORMAL HIGH (ref 70–99)

## 2022-09-02 NOTE — Progress Notes (Signed)
Physical Therapy Session Note  Patient Details  Name: Anthony Woods MRN: [id number] Date of Birth: 1956/03/18  Today's Date: 09/02/2022 PT Individual Time: 0848-1000 PT Individual Time Calculation (min): 72 min   Short Term Goals: Week 3:  PT Short Term Goal 1 (Week 3): STG = LTG due to ELOS  Skilled Therapeutic Interventions/Progress Updates:    Pt presents in room in [facility], agreeable to participate with PT with encouragement. Pt denies pain. Session focused on NMR for BLE coordination and dynamic standing balance and gait training. Pt completes sit<>stand to RW with CGA throughout session.   Pt transported dependently fro room to day room in [facility]. Pt ambulates 170' with RW CGA with mod/max verbal cues for obstacle negotiation on R side as well as to increase proximity to RW as pt tends to keep walker in front. Pt demonstrates scissoring throughout, unable to correct with verbal cueing however does note that he does this following gait trial.  Pt completes NMR for BLE coordination and dynamic standing balance with decreasing UE support, with 0.75# weights on BLEs, including: - Standing marching BUE support on RW x20 alternating BLE (cues to keep feet wide) - Lateral step x10 BLE with min assist HHA - Forward step x10 BLE with min/mod assist HHA increased difficulty stepping forward with LLE - Sit<>stands with red tband around knees 2x5 (min assist for anterior wt shift and immediate standing balance - Kinetron (seated on kinetron) 30sec work/30sec rest workload 10 for 5 min (cues to prevent use of arms for increased BLE and core muscle fiber [employer] - Standing kinetron 1:6min work/2 min rest workload L5 for total 10 minutes  (mirror positioned in front of pt with verbal cues to look forward and maintain upright posture)  Pt remains in day room with RN present at end of session seated in [facility] with next PT notified of pt position for follow up session.     Therapy  Documentation Precautions:  Precautions Precautions: Fall Precaution Comments: R sided reduced coordination, R sided weakness and LLE weakness/reduced motor planning during ambulation Restrictions Weight Bearing Restrictions: No    Therapy/Group: Individual Therapy  Edwin Cap PT, DPT 09/02/2022, 10:09 AM

## 2022-09-02 NOTE — Progress Notes (Signed)
Occupational Therapy Discharge Summary  Patient Details  Name: Anthony Woods MRN: [id number] Date of Birth: February 06, 1956  Date of Discharge from OT service:September 02, 2022  Today's Date: 09/02/2022 OT Individual Time: 1300-1345 OT Individual Time Calculation (min): 45 min    Patient has met 9 of 9 long term goals due to improved activity tolerance, improved balance, postural control, ability to compensate for deficits, functional use of  RIGHT upper, RIGHT lower, LEFT upper, and LEFT lower extremity, improved attention, improved awareness, and improved coordination.  Patient to discharge at overall Supervision level.  Patient's care partner is independent to provide the necessary physical and cognitive assistance at discharge.    Reasons goals not met: n/a  Recommendation:  Patient will benefit from ongoing skilled OT services in home health setting to continue to advance functional skills in the area of BADL, iADL, Vocation, and Reduce care partner burden.  Equipment: 3 in 1 commode, has RW   Reasons for discharge: treatment goals met  Patient/family agrees with progress made and goals achieved: Yes  OT Discharge Precautions/Restrictions  Precautions Precautions: Fall Restrictions Weight Bearing Restrictions: No General   Vital Signs Therapy Vitals Temp: 98.4 F (36.9 C) Temp Source: Oral Pulse Rate: 73 Resp: 16 BP: 127/77 Patient Position (if appropriate): Sitting Oxygen Therapy SpO2: 99 % O2 Device: Room Air Pain Pain Assessment Pain Scale: 0-10 Pain Score: 0-No pain ADL ADL Equipment Provided: Long-handled sponge Eating: Independent Where Assessed-Eating: Wheelchair Grooming: Supervision/safety Where Assessed-Grooming: Standing at sink, Sitting at sink Upper Body Bathing: Modified independent Where Assessed-Upper Body Bathing: Sitting at sink Lower Body Bathing: Modified independent Where Assessed-Lower Body Bathing: Standing at sink, Sitting at sink Upper  Body Dressing: Modified independent (Device) Where Assessed-Upper Body Dressing: Sitting at sink Lower Body Dressing: Supervision/safety Where Assessed-Lower Body Dressing: Sitting at sink, Standing at sink Toileting: Supervision/safety Where Assessed-Toileting: Teacher, adult education: Close supervision Toilet Transfer Method: Proofreader: Acupuncturist: Close supervision Film/video editor Method: Designer, industrial/product: Information systems manager with back ADL Comments: mod I seated ADL's and S for shower and toileting Vision Baseline Vision/History: 1 Wears glasses Patient Visual Report: Blurring of vision Vision Assessment?: No apparent visual deficits Perception  Perception: Within Functional Limits Praxis Praxis: Impaired Praxis Impairment Details: Motor planning Cognition Cognition Overall Cognitive Status: Impaired/Different from baseline Arousal/Alertness: Awake/alert Orientation Level: Person;Place;Situation Memory: Impaired Memory Impairment: Decreased short term memory;Storage deficit;Decreased recall of new information Decreased Short Term Memory: Verbal basic Attention: Selective;Sustained;Focused Focused Attention: Appears intact Sustained Attention: Appears intact Sustained Attention Impairment: Verbal basic;Functional basic Selective Attention: Impaired Selective Attention Impairment: Verbal complex;Functional complex Awareness: Impaired Awareness Impairment: Emergent impairment Problem Solving Impairment: Functional basic;Verbal basic Executive Function: Self Monitoring;Self Correcting Self Monitoring: Impaired Self Monitoring Impairment: Functional basic Self Correcting Impairment: Functional basic Safety/Judgment: Impaired Comments: Decreased insight into deficits and awareness Brief Interview for Mental Status (BIMS) Repetition of Three Words (First Attempt): 3 Temporal Orientation: Year:  Correct Temporal Orientation: Month: Accurate within 5 days Temporal Orientation: Day: Correct Recall: "Sock": Yes, no cue required Recall: "Blue": Yes, after cueing ("a color") Recall: "Bed": Yes, after cueing ("a piece of furniture") BIMS Summary Score: 13 Sensation Sensation Light Touch: Appears Intact Hot/Cold: Appears Intact Proprioception: Impaired by gross assessment Coordination Gross Motor Movements are Fluid and Coordinated: No Fine Motor Movements are Fluid and Coordinated: No Coordination and Movement Description: Generalized debility Finger Nose Finger Test: improved R UE WFL L UE Motor  Motor Motor: Hemiplegia;Ataxia;Abnormal postural alignment and  control Motor - Skilled Clinical Observations: decreased coordination with RLE>LLE, and mild decreased coordination with RUE, can improve motor planning with focus Motor - Discharge Observations: Decreased coordination on RLE > LLE Mobility  Bed Mobility Bed Mobility: Supine to Sit;Sit to Supine Left Sidelying to Sit: Independent with assistive device Supine to Sit: Independent with assistive device Sit to Supine: Independent with assistive device Transfers Sit to Stand: Independent with assistive device Stand to Sit: Independent with assistive device  Trunk/Postural Assessment  Cervical Assessment Cervical Assessment: Exceptions to [facility] Thoracic Assessment Thoracic Assessment: Exceptions to [facility] Lumbar Assessment Lumbar Assessment: Exceptions to [facility] Postural Control Postural Control: Deficits on evaluation Righting Reactions: delayed and insufficient  Balance Balance Balance Assessed: Yes Static Sitting Balance Static Sitting - Level of Assistance: 7: Independent Dynamic Sitting Balance Dynamic Sitting - Balance Support: No upper extremity supported;During functional activity Static Standing Balance Static Standing - Balance Support: Bilateral upper extremity supported Dynamic Standing Balance Dynamic  Standing - Balance Support: Bilateral upper extremity supported Dynamic Standing - Level of Assistance: 4: Min assist Extremity/Trunk Assessment RUE Assessment RUE Assessment: Within Functional Limits LUE Assessment LUE Assessment: Within Functional Limits  OT treatment/Intervention:  Pt seen for skilled OT session with focus on discharge education, training and falls prevention and safety training. Sisters present for session. Pt completed all of above reassessment with education for motor planning, balance, R sided graded control and falls prevention. Able to step over threshold of stall shower with S and standing and sitting ADL safety. Rec RW which pt has at home and 3 in 1 commode which has been delivered. Rec S for all mobility with transfers and amb and wife has also been trained for safety. All in agreement for d/c with UE HEP completed this session. No further OT needs at CIR with HHOT rec upon d/c home.    Vicenta Dunning 09/02/2022, 3:27 PM

## 2022-09-02 NOTE — Progress Notes (Signed)
Physical Therapy Session Note  Patient Details  Name: Anthony Woods MRN: [id number] Date of Birth: January 03, 1956  Today's Date: 09/02/2022 PT Individual Time: 4098-1191 + 1000-1056 PT Individual Time Calculation (min): 38 min  + 56 min  Short Term Goals: Week 3:  PT Short Term Goal 1 (Week 3): STG = LTG due to ELOS  Skilled Therapeutic Interventions/Progress Updates:      1st session: Pt in bed sleeping to start - awakens to voice and in agreement to therapy session but needs ++ time to fully wake. Bed mobility completed at mod I level. Don's socks, shoes, R neoprene knee sleeve, and clean shirt at EOB without assist. Pt reporting urge to void - sit<>Stand to RW with supervision from EOB and ambulates with CGA and RW into the bathroom - continent of bladder void in standing and needing safety cues while navigating tight space in bathroom. Hand washing completed at sinkside with CGA for balance, pt leaning with hips on sink for stability in stance.  Propelled himself mod I in w/c to day room rehab gym, ~162ft. Setup with Wii Golf to challenge dynamic standing balance and righting reactions. Patient needing grossly minA for dynamic standing balance, unsupported, while playing rounds of golf. Patient required mod instructional cues for using the Wii Remote and patient with decreased recall for each rounding, forgetful of basic instructions.   Returned to his room and ended session seated in w/c with safety belt alarm on and call bell within reach. All needs met.     2nd session: Pt sitting in w/c to start in rehab gym with RN present administering medications. Patient denies pain but reports need to urinate. Transported to his room for time management. Ambulatory transfer with CGA and RW in his room to the bathroom. Continent of bladder void. Propelled himself mod I in w/c to main rehab gym, ~152ft. Stair training completed with 6" steps and 2 hand rails - navigated x12 steps with step-to pattern  while forward facing, cues for ensuring full foot on step prior to continuing. TUG completed with RW in 55.5 seconds, scores >13.5 seconds indicate increased falls risk. 5xSTS completed in 47 seconds from arm chair using arms to push to assist with standing, scores >15 seconds indicate increased falls risk. Ambulatory car transfer completed with CGA and RW with car height replicating their personal vehicle. Patient able to complete with min cues for safety approach and to avoid grabbing door to reduce risk of falls. Returned to his room and ended session seated in w/c with safety belt alarm on. Family members present at the bedside. All needs met.      Therapy Documentation Precautions:  Precautions Precautions: Fall Precaution Comments: R sided reduced coordination, R sided weakness and LLE weakness/reduced motor planning during ambulation Restrictions Weight Bearing Restrictions: No General:    Therapy/Group: Individual Therapy  Orrin Brigham 09/02/2022, 7:33 AM

## 2022-09-02 NOTE — Plan of Care (Signed)

## 2022-09-02 NOTE — Progress Notes (Signed)
Physical Therapy Discharge Summary  Patient Details  Name: Anthony Woods MRN: [id number] Date of Birth: Aug 23, 1955  Date of Discharge from PT service:September 02, 2022   Patient has met 11 of 11 long term goals due to improved activity tolerance, improved balance, improved postural control, increased strength, decreased pain, ability to compensate for deficits, functional use of  right upper extremity and right lower extremity, improved attention, and improved awareness.  Patient to discharge at an ambulatory level Supervision/CGA.   Patient's care partner is independent to provide the necessary physical and cognitive assistance at discharge.  Functional outcome measures: TUG: 55.5 seconds with RW 5xSTS: 47 seconds from arm chair (using UE to push to stand) PASS: 29/36  Reasons goals not met: n/a  Recommendation:  Patient will benefit from ongoing skilled PT services in home health setting to continue to advance safe functional mobility, address ongoing impairments in dynamic standing balance, generalized weakness and deconditioning, home safety, and minimize fall risk.  Equipment: RW  Reasons for discharge: treatment goals met and discharge from hospital  Patient/family agrees with progress made and goals achieved: Yes  PT Discharge Precautions/Restrictions Precautions Precautions: Fall Restrictions Weight Bearing Restrictions: No Pain Interference Pain Interference Pain Effect on Sleep: 0. Does not apply - I have not had any pain or hurting in the past 5 days Pain Interference with Therapy Activities: 0. Does not apply - I have not received rehabilitationtherapy in the past 5 days Pain Interference with Day-to-Day Activities: 1. Rarely or not at all Vision/Perception  Vision - History Ability to See in Adequate Light: 1 Impaired Perception Perception: Within Functional Limits Praxis Praxis: Impaired Praxis Impairment Details: Motor planning  Cognition Overall Cognitive  Status: Impaired/Different from baseline Arousal/Alertness: Awake/alert Attention: Selective;Sustained;Focused Focused Attention: Appears intact Sustained Attention: Appears intact Selective Attention: Impaired Selective Attention Impairment: Verbal complex;Functional complex Memory: Impaired Memory Impairment: Decreased short term memory;Storage deficit;Decreased recall of new information Awareness: Impaired Awareness Impairment: Emergent impairment Problem Solving: Impaired Problem Solving Impairment: Functional basic;Verbal basic Executive Function: Self Monitoring;Self Correcting Self Monitoring: Impaired Self Monitoring Impairment: Functional basic Self Correcting: Impaired Self Correcting Impairment: Functional basic Safety/Judgment: Impaired Comments: Decreased insight into deficits and awareness Sensation Sensation Light Touch: Appears Intact Hot/Cold: Appears Intact Proprioception: Impaired by gross assessment Stereognosis: Appears Intact Coordination Gross Motor Movements are Fluid and Coordinated: No Coordination and Movement Description: Generalized debility Motor  Motor Motor: Hemiplegia;Ataxia;Abnormal postural alignment and control Motor - Discharge Observations: Decreased coordination on RLE > LLE  Mobility Bed Mobility Bed Mobility: Supine to Sit;Sit to Supine Supine to Sit: Independent with assistive device Sit to Supine: Independent with assistive device Transfers Transfers: Sit to Stand;Stand to Sit;Stand Pivot Transfers Sit to Stand: Independent with assistive device Stand to Sit: Independent with assistive device Stand Pivot Transfers: Contact Guard/Touching assist Stand Pivot Transfer Details: Verbal cues for precautions/safety;Verbal cues for gait pattern;Tactile cues for initiation Transfer (Assistive device): Rolling walker Locomotion  Gait Ambulation: Yes Gait Assistance: Contact Guard/Touching assist Gait Distance (Feet): 150  Feet Assistive device: Rolling walker Gait Assistance Details: Verbal cues for gait pattern;Verbal cues for precautions/safety;Verbal cues for safe use of DME/AE Gait Gait: Yes Gait Pattern: Impaired Gait Pattern: Step-through pattern;Decreased stride length;Trunk flexed;Right flexed knee in stance;Left flexed knee in stance;Poor foot clearance - left;Poor foot clearance - right Stairs / Additional Locomotion Stairs: Yes Stairs Assistance: Contact Guard/Touching assist Stair Management Technique: Two rails;Step to pattern;Forwards Number of Stairs: 12 Height of Stairs: 6 Pick up small object from the floor assist level:  Minimal Assistance - Patient > 75% Corporate treasurer: Yes Wheelchair Assistance: Independent with Scientist, research (life sciences): Both upper extremities Wheelchair Parts Management: Needs assistance Distance: 150'  Trunk/Postural Assessment  Cervical Assessment Cervical Assessment: Exceptions to [facility] (slight forward head) Thoracic Assessment Thoracic Assessment: Exceptions to [facility] (rounded shoulders) Lumbar Assessment Lumbar Assessment: Exceptions to [facility] (posterior tilt) Postural Control Postural Control: Deficits on evaluation Righting Reactions: delayed and insufficient  Balance Balance Balance Assessed: Yes Standardized Balance Assessment Standardized Balance Assessment: PASS Postural Assessment Scale for Stroke Patients=PASS 1. Sitting Without Support: Can sit for 5 minutes without support 2. Standing With Support: Can stand with support of only 1 hand 3. Standing Without Support: Can stand without support for more than 1 min. and simultaneously perform arm movements at about shoulder level 4.Standing on Nonparetic Leg: Cannot stand on nonparetic leg 5.Standing on Paretic Leg: Cannot stand on paretic leg MAINTAINING POSTURE SUBTOTAL: 9 6. Supine to Paretic Side Lateral: Can perform without help 7. Supine to Nonparetic Side  Lateral: Can perform without help 8. Supine to Sitting Up on the Edge of the Mat: Can perform without help 9. Sitting on the Edge of the Mat to Supine: Can perform without help 10. Sitting to Standing Up: Can perform without help 11. Standing Up to Sitting Down: Can perform without help 12. Standing,Picking Up a Pencil from the Floor: Can perform with little help CHANGING POSTURE SUBTOTAL: 20 PASS TOTAL SCORE: 29 Static Sitting Balance Static Sitting - Balance Support: Left upper extremity supported;Feet supported Static Sitting - Level of Assistance: 7: Independent Dynamic Sitting Balance Dynamic Sitting - Balance Support: No upper extremity supported;During functional activity Dynamic Sitting - Level of Assistance: 6: Modified independent (Device/Increase time) Static Standing Balance Static Standing - Balance Support: Bilateral upper extremity supported Static Standing - Level of Assistance: 5: Stand by assistance Dynamic Standing Balance Dynamic Standing - Balance Support: Bilateral upper extremity supported Dynamic Standing - Level of Assistance: 4: Min assist Extremity Assessment      RLE Assessment RLE Assessment: Exceptions to [facility] General Strength Comments: Grossly 4/5 LLE Assessment LLE Assessment: Exceptions to [facility] General Strength Comments: Grossly 4/5   Mable Dara P Shavette Shoaff  PT, DPT, CSRS  09/02/2022, 7:43 AM

## 2022-09-02 NOTE — Progress Notes (Signed)
PROGRESS NOTE   Subjective/Complaints: No new complaints this morning Wife Eunice Blase is here for training Ready for d/c tomorrow CBGs 102-204  Objective: ROS: continued cognitive impairment, +insomnia- improved, knee pain is better controlled   No results found. No results for input(s): "WBC", "HGB", "HCT", "PLT" in the last 72 hours.    No results for input(s): "NA", "K", "CL", "CO2", "GLUCOSE", "BUN", "CREATININE", "CALCIUM" in the last 72 hours.     Intake/Output Summary (Last 24 hours) at 09/02/2022 1436 Last data filed at 09/02/2022 1300 Gross per 24 hour  Intake 1076 ml  Output 1275 ml  Net -199 ml        Physical Exam: Vital Signs Blood pressure 127/77, pulse 73, temperature 98.4 F (36.9 C), temperature source Oral, resp. rate 16, height 5\' 6"  (1.676 m), weight 77.8 kg, SpO2 99 %. Gen: no distress, normal appearing HEENT: oral mucosa pink and moist, NCAT Cardio: Reg rate Chest: normal effort, normal rate of breathing Abd: soft, non-distended Ext: no clubbing, cyanosis, or edema Psych: pleasant and cooperative  Skin: intact Neurologic: distracted. Oriented to self, month, year. Didn't know where he was. Good sense of humor. Cranial nerves II through XII intact, motor strength is 4/5 in bilateral deltoid, bicep, tricep, grip, 3- bilat hip flexor, knee extensors, ankle dorsiflexor and plantar flexor, impaired memory Sensory exam normal for light touch and pain in all 4 limbs. No limb ataxia or cerebellar signs. No abnormal tone appreciated.   musculoskeletal: Full range of motion in all 4 extremities. No joint swelling   Assessment/Plan: 1. Functional deficits which require 3+ hours per day of interdisciplinary therapy in a comprehensive inpatient rehab setting. Physiatrist is providing close team supervision and 24 hour management of active medical problems listed below. Physiatrist and rehab team continue to  assess barriers to discharge/monitor patient progress toward functional and medical goals  Care Tool:  Bathing    Body parts bathed by patient: Right arm, Left arm, Chest, Abdomen, Front perineal area, Right upper leg, Left upper leg, Face, Buttocks, Left lower leg, Right lower leg   Body parts bathed by helper: Buttocks, Left lower leg, Right lower leg     Bathing assist Assist Level: Supervision/Verbal cueing     Upper Body Dressing/Undressing Upper body dressing   What is the patient wearing?: Pull over shirt    Upper body assist Assist Level: Independent with assistive device    Lower Body Dressing/Undressing Lower body dressing      What is the patient wearing?: Underwear/pull up, Pants     Lower body assist Assist for lower body dressing: Independent with assitive device     Toileting Toileting    Toileting assist Assist for toileting: Independent with assistive device Assistive Device Comment: walker and commode over toilet   Transfers Chair/bed transfer  Transfers assist     Chair/bed transfer assist level: Supervision/Verbal cueing     Locomotion Ambulation   Ambulation assist      Assist level: Contact Guard/Touching assist Assistive device: Walker-rolling Max distance: 150'   Walk 10 feet activity   Assist     Assist level: Contact Guard/Touching assist Assistive device: Walker-rolling   Walk 50 feet  activity   Assist Walk 50 feet with 2 turns activity did not occur: Safety/medical concerns  Assist level: Contact Guard/Touching assist Assistive device: Walker-rolling    Walk 150 feet activity   Assist Walk 150 feet activity did not occur: Safety/medical concerns  Assist level: Contact Guard/Touching assist Assistive device: Walker-rolling    Walk 10 feet on uneven surface  activity   Assist Walk 10 feet on uneven surfaces activity did not occur: Safety/medical concerns   Assist level: Contact Guard/Touching  assist Assistive device: Walker-rolling   Wheelchair     Assist Is the patient using a wheelchair?: Yes Type of Wheelchair: Manual    Wheelchair assist level: Independent Max wheelchair distance: 300 ft    Wheelchair 50 feet with 2 turns activity    Assist        Assist Level: Independent   Wheelchair 150 feet activity     Assist      Assist Level: Independent   Blood pressure 127/77, pulse 73, temperature 98.4 F (36.9 C), temperature source Oral, resp. rate 16, height 5\' 6"  (1.676 m), weight 77.8 kg, SpO2 99 %.    Medical Problem List and Plan: 1. Functional deficits secondary to left pontine and bilateral thalamic, parietal embolic strokes             -patient may shower             -ELOS/Goals: 10 to 14 days, supervision goals PT/OT/SLP   Grounds pass ordered  Continue CIR therapies including PT, OT, and SLP   Updated sister   2.  Atrial fibrillation: continue Eliquis             -antiplatelet therapy: aspirin    3. Knee pain: Tylenol, Robaxin as needed. Continue knee sleeve.    4. Mood/Behavior/Sleep: LCSW to evaluate and provide emotional support             -continue Lexapro             -antipsychotic agents: n/a   -6/2 night time restlessness and agitation this weekend   -UCX negative   -increase seroquel to 50mg  at 2000   -NEED to keep sleep chart 5. Neuropsych/cognition: This patient is not capable of making decisions on his own behalf.   6. Skin/Wound Care: Routine skin care checks   7. Fluids/Electrolytes/Nutrition/aphasia: Routine Is and Os and follow-up chemistries -On modified diet    8: Hypertension: monitor TID and prn; at home on tenoretic             -decrease atenolol to 12.5mg  daily.    6/2 bp still elevated this morning, increase atenolol to 25mg  daily 9: Hyperlipidemia: continue statin, Zetia   10: CAD s/p DES, 2018, 2019             -continue aspirin and statin, Zetia, BB   11: pAF: continue Eliquis and BB   12:  DM-2: at home on metformin 1000 mg BID, Humalog 75/25 40 units, Ozempic             -d/c ISS, monitor intake, CBGs QID             -Add carb controlled to dysphagia diet  -restart Ozempic at home  -placed nursing order to not give patient any juice or soda  Increase acarbose to 50mg  TID  Increase magnesium gluconate 500g HS  -begin 70/30 insulin bid at 6 units 6/2 increase to 10u bid 13: Tobacco use: cessation counseling; continue Nicoderm   14: Hypothyroidism: continue  Synthroid   15: Overweight: dietary/exercise counseling, increase magneisum gluconate to 500mg  HS. Discussed restarting Ozempic at home   16: Left foot pain: possible non-displaced fracture vs. osseous remolding 5th toe             -continue Darco shoe, buddy wrap  17. Bradycardia: d/c atenolol  18. Hypokalemia: ordered klor once on 5/27, and on 6/3  19. Dizziness: d/c atenolol  20. Right knee buckling/chronic pain: knee sleeve ordered, Zynex Nexwave ordered for outpatient use  22. Nodule on anterior chest wall: nontender, discussed may be lipoma  23. Tachycardia: resolved, continue toprol XL HS  24. Insomnia: continue trazodone to 75mg  HS  25. HTN: continue toprol 12.5mg  XL HS, BP reviewed and is currently under excellent control  26. Mild cognitive impairment: discussed Namenda with his sister and she will discuss with her siblings as well, continue SLP, discussed that he could have early stages of vascular dementia    LOS: 20 days A FACE TO FACE EVALUATION WAS PERFORMED  Clint Bolder P Letcher Schweikert 09/02/2022, 2:36 PM

## 2022-09-02 NOTE — Plan of Care (Signed)
  Problem: RH Balance Goal: LTG Patient will maintain dynamic sitting balance (PT) Description: LTG:  Patient will maintain dynamic sitting balance with assistance during mobility activities (PT) Outcome: Completed/Met Goal: LTG Patient will maintain dynamic standing balance (PT) Description: LTG:  Patient will maintain dynamic standing balance with assistance during mobility activities (PT) Outcome: Completed/Met   Problem: Sit to Stand Goal: LTG:  Patient will perform sit to stand with assistance level (PT) Description: LTG:  Patient will perform sit to stand with assistance level (PT) Outcome: Completed/Met   Problem: RH Bed Mobility Goal: LTG Patient will perform bed mobility with assist (PT) Description: LTG: Patient will perform bed mobility with assistance, with/without cues (PT). Outcome: Completed/Met   Problem: RH Bed to Chair Transfers Goal: LTG Patient will perform bed/chair transfers w/assist (PT) Description: LTG: Patient will perform bed to chair transfers with assistance (PT). Outcome: Completed/Met   Problem: RH Car Transfers Goal: LTG Patient will perform car transfers with assist (PT) Description: LTG: Patient will perform car transfers with assistance (PT). Outcome: Completed/Met   Problem: RH Furniture Transfers Goal: LTG Patient will perform furniture transfers w/assist (OT/PT) Description: LTG: Patient will perform furniture transfers  with assistance (OT/PT). Outcome: Completed/Met   Problem: RH Ambulation Goal: LTG Patient will ambulate in controlled environment (PT) Description: LTG: Patient will ambulate in a controlled environment, # of feet with assistance (PT). Outcome: Completed/Met Goal: LTG Patient will ambulate in home environment (PT) Description: LTG: Patient will ambulate in home environment, # of feet with assistance (PT). Outcome: Completed/Met   Problem: RH Wheelchair Mobility Goal: LTG Patient will propel w/c in controlled environment  (PT) Description: LTG: Patient will propel wheelchair in controlled environment, # of feet with assist (PT) Outcome: Completed/Met   Problem: RH Stairs Goal: LTG Patient will ambulate up and down stairs w/assist (PT) Description: LTG: Patient will ambulate up and down # of stairs with assistance (PT) Outcome: Completed/Met

## 2022-09-03 ENCOUNTER — Other Ambulatory Visit (HOSPITAL_COMMUNITY): Payer: Self-pay

## 2022-09-03 LAB — GLUCOSE, CAPILLARY
Glucose-Capillary: 125 mg/dL — ABNORMAL HIGH (ref 70–99)
Glucose-Capillary: 206 mg/dL — ABNORMAL HIGH (ref 70–99)

## 2022-09-03 MED ORDER — METOPROLOL SUCCINATE ER 25 MG PO TB24
12.5000 mg | ORAL_TABLET | Freq: Every day | ORAL | 0 refills | Status: DC
  Filled 2022-09-03: qty 15, 30d supply, fill #0

## 2022-09-03 MED ORDER — NICOTINE 21 MG/24HR TD PT24
21.0000 mg | MEDICATED_PATCH | Freq: Every day | TRANSDERMAL | 0 refills | Status: DC
  Filled 2022-09-03: qty 28, 28d supply, fill #0

## 2022-09-03 MED ORDER — EZETIMIBE 10 MG PO TABS
10.0000 mg | ORAL_TABLET | Freq: Every day | ORAL | 0 refills | Status: DC
  Filled 2022-09-03: qty 30, 30d supply, fill #0

## 2022-09-03 MED ORDER — ACETAMINOPHEN 325 MG PO TABS: 325.0000 mg | ORAL_TABLET | Freq: Four times a day (QID) | ORAL | Status: AC | PRN

## 2022-09-03 MED ORDER — ASPIRIN 81 MG PO TBEC
81.0000 mg | DELAYED_RELEASE_TABLET | Freq: Every day | ORAL | 0 refills | Status: DC
  Filled 2022-09-03: qty 120, 120d supply, fill #0

## 2022-09-03 MED ORDER — MELATONIN 3 MG PO TABS
3.0000 mg | ORAL_TABLET | Freq: Every day | ORAL | 0 refills | Status: DC
  Filled 2022-09-03: qty 30, 30d supply, fill #0

## 2022-09-03 MED ORDER — TRAZODONE HCL 150 MG PO TABS
75.0000 mg | ORAL_TABLET | Freq: Every day | ORAL | 0 refills | Status: DC
  Filled 2022-09-03: qty 15, 30d supply, fill #0

## 2022-09-03 MED ORDER — LINAGLIPTIN 5 MG PO TABS
5.0000 mg | ORAL_TABLET | Freq: Every day | ORAL | 0 refills | Status: DC
  Filled 2022-09-03: qty 30, 30d supply, fill #0

## 2022-09-03 MED ORDER — APIXABAN 5 MG PO TABS
5.0000 mg | ORAL_TABLET | Freq: Two times a day (BID) | ORAL | 0 refills | Status: DC
  Filled 2022-09-03: qty 60, 30d supply, fill #0

## 2022-09-03 MED ORDER — ACARBOSE 50 MG PO TABS
50.0000 mg | ORAL_TABLET | Freq: Three times a day (TID) | ORAL | 0 refills | Status: DC
  Filled 2022-09-03: qty 90, 30d supply, fill #0

## 2022-09-03 MED ORDER — QUETIAPINE FUMARATE 50 MG PO TABS
50.0000 mg | ORAL_TABLET | Freq: Every day | ORAL | 0 refills | Status: DC
  Filled 2022-09-03: qty 30, 30d supply, fill #0

## 2022-09-03 MED ORDER — LEVOTHYROXINE SODIUM 175 MCG PO TABS
175.0000 ug | ORAL_TABLET | Freq: Every day | ORAL | 0 refills | Status: DC
  Filled 2022-09-03: qty 30, 30d supply, fill #0

## 2022-09-03 MED ORDER — MAGNESIUM OXIDE -MG SUPPLEMENT 400 (240 MG) MG PO TABS
400.0000 mg | ORAL_TABLET | Freq: Every day | ORAL | 0 refills | Status: DC
  Filled 2022-09-03: qty 120, 120d supply, fill #0

## 2022-09-03 MED ORDER — ROSUVASTATIN CALCIUM 20 MG PO TABS
20.0000 mg | ORAL_TABLET | Freq: Every day | ORAL | 0 refills | Status: DC
  Filled 2022-09-03: qty 30, 30d supply, fill #0

## 2022-09-03 MED ORDER — METFORMIN HCL 1000 MG PO TABS
1000.0000 mg | ORAL_TABLET | Freq: Two times a day (BID) | ORAL | 0 refills | Status: DC
  Filled 2022-09-03: qty 60, 30d supply, fill #0

## 2022-09-03 NOTE — Progress Notes (Signed)
Inpatient Rehabilitation Care Coordinator Discharge Note   Patient Details  Name: Anthony Woods MRN: [id number] Date of Birth: 1955-09-29   Discharge location: HOME WITH WIFE WHO IS ABLE TO PROIVIDE SUPERIVSION LEVEL  Length of Stay: 21 DAYS  Discharge activity level: SUPERVISION-CGA LEVEL  Home/community participation: ACTIVE  Patient response NW:[id number] Literacy - How often do you need to have someone help you when you read instructions, pamphlets, or other written material from your doctor or pharmacy?: Never  Patient response HY:[id number] Isolation - How often do you feel lonely or isolated from those around you?: Never  Services provided included: MD, RD, PT, OT, SLP, RN, CM, TR, Pharmacy, Neuropsych, SW  Financial Services:  Field seismologist Utilized: Medicare    Choices offered to/list presented to: PT AND WIFE  Follow-up services arranged:  Home Health, DME, Patient/Family request agency HH/DME Home Health Agency: [facility]  PT  OT  SP  RN    DME : [facility] HH/DME Requested Agency: ACTIVE WITH [facility] AFTER LAST CVA   HAS OTHER NEEDED EQUIPMENT FROM PAST ADMISSIONS Patient response to transportation need: Is the patient able to respond to transportation needs?: Yes In the past 12 months, has lack of transportation kept you from medical appointments or from getting medications?: No In the past 12 months, has lack of transportation kept you from meetings, work, or from getting things needed for daily living?: No   Patient/Family verbalized understanding of follow-up arrangements:  Yes  Individual responsible for coordination of the follow-up plan: SELF AND DERA-WIFE [phone]  Confirmed correct DME delivered: Anthony Woods 09/03/2022    Comments (or additional information): WIFE WAS HERE ALONG WITH HIS TWO SISTER'S TO SEE HIS PROGRESS AND CARE NEEDS. MAIN BARRIER IS HE HAS NO PRESCRIPTION COVERAGE AND WIFE TO CHANGE HIM TO HEALTH TEAM  ADVANTAGE LIKE SHE HAS.   Summary of Stay    Date/Time Discharge Planning CSW  09/01/22 3244 Wife has been here and attended therapies with pt and can see his progress. he is getting to the level he is too high to go to a NH. Main issue is wife can manage him at home with her own health issues. Concern over insulin but wife reports not an issue. Needs a supplement insurance or Medicare advantage plan for prescription coverage. Discuss at team conference RGD  08/25/22 0846 Wife is only able to provide supervision due to her own health issues. If he requires more care may need to pursue alternatives-ie SNF. Will discuss best plan for him with wife after team conference. RGD  08/18/22 0841 Confirming discharge plan-unsure if wife will be assisting him. Has siblings that can check on him. Pt reports wife will be there-do not have confirmation from wife RGD       Treasure Ingrum, Lemar Livings

## 2022-09-03 NOTE — Progress Notes (Signed)
PROGRESS NOTE   Subjective/Complaints: No new complaints this morning Feels ready to d/c home Discussed current blood sugars and that starting Ozempic will help firther  Objective: ROS: continued cognitive impairment, +insomnia- improved, knee pain is better controlled   No results found. No results for input(s): "WBC", "HGB", "HCT", "PLT" in the last 72 hours.    No results for input(s): "NA", "K", "CL", "CO2", "GLUCOSE", "BUN", "CREATININE", "CALCIUM" in the last 72 hours.     Intake/Output Summary (Last 24 hours) at 09/03/2022 0942 Last data filed at 09/03/2022 0900 Gross per 24 hour  Intake 893 ml  Output 1425 ml  Net -532 ml        Physical Exam: Vital Signs Blood pressure (!) 143/84, pulse 67, temperature 97.9 F (36.6 C), temperature source Oral, resp. rate 18, height 5\' 6"  (1.676 m), weight 77.8 kg, SpO2 97 %. Gen: no distress, normal appearing HEENT: oral mucosa pink and moist, NCAT Cardio: Reg rate Chest: normal effort, normal rate of breathing Abd: soft, non-distended Ext: no clubbing, cyanosis, or edema Psych: pleasant and cooperative  Skin: intact Neurologic: distracted. Oriented to self, month, year. Didn't know where he was. Good sense of humor. Cranial nerves II through XII intact, motor strength is 4/5 in bilateral deltoid, bicep, tricep, grip, 3- bilat hip flexor, knee extensors, ankle dorsiflexor and plantar flexor, impaired memory Sensory exam normal for light touch and pain in all 4 limbs. No limb ataxia or cerebellar signs. No abnormal tone appreciated.   musculoskeletal: Full range of motion in all 4 extremities. No joint swelling   Assessment/Plan: 1. Functional deficits which require 3+ hours per day of interdisciplinary therapy in a comprehensive inpatient rehab setting. Physiatrist is providing close team supervision and 24 hour management of active medical problems listed  below. Physiatrist and rehab team continue to assess barriers to discharge/monitor patient progress toward functional and medical goals  Care Tool:  Bathing    Body parts bathed by patient: Right arm, Left arm, Chest, Abdomen, Front perineal area, Right upper leg, Left upper leg, Face, Buttocks, Left lower leg, Right lower leg   Body parts bathed by helper: Buttocks, Left lower leg, Right lower leg     Bathing assist Assist Level: Supervision/Verbal cueing     Upper Body Dressing/Undressing Upper body dressing   What is the patient wearing?: Pull over shirt    Upper body assist Assist Level: Independent with assistive device    Lower Body Dressing/Undressing Lower body dressing      What is the patient wearing?: Underwear/pull up, Pants     Lower body assist Assist for lower body dressing: Independent with assitive device     Toileting Toileting    Toileting assist Assist for toileting: Independent with assistive device Assistive Device Comment: walker and commode over toilet   Transfers Chair/bed transfer  Transfers assist     Chair/bed transfer assist level: Supervision/Verbal cueing     Locomotion Ambulation   Ambulation assist      Assist level: Contact Guard/Touching assist Assistive device: Walker-rolling Max distance: 150'   Walk 10 feet activity   Assist     Assist level: Contact Guard/Touching assist Assistive device: Walker-rolling  Walk 50 feet activity   Assist Walk 50 feet with 2 turns activity did not occur: Safety/medical concerns  Assist level: Contact Guard/Touching assist Assistive device: Walker-rolling    Walk 150 feet activity   Assist Walk 150 feet activity did not occur: Safety/medical concerns  Assist level: Contact Guard/Touching assist Assistive device: Walker-rolling    Walk 10 feet on uneven surface  activity   Assist Walk 10 feet on uneven surfaces activity did not occur: Safety/medical  concerns   Assist level: Contact Guard/Touching assist Assistive device: Walker-rolling   Wheelchair     Assist Is the patient using a wheelchair?: Yes Type of Wheelchair: Manual    Wheelchair assist level: Independent Max wheelchair distance: 300 ft    Wheelchair 50 feet with 2 turns activity    Assist        Assist Level: Independent   Wheelchair 150 feet activity     Assist      Assist Level: Independent   Blood pressure (!) 143/84, pulse 67, temperature 97.9 F (36.6 C), temperature source Oral, resp. rate 18, height 5\' 6"  (1.676 m), weight 77.8 kg, SpO2 97 %.    Medical Problem List and Plan: 1. Functional deficits secondary to left pontine and bilateral thalamic, parietal embolic strokes             -patient may shower             -ELOS/Goals: 10 to 14 days, supervision goals PT/OT/SLP   Grounds pass ordered  Continue CIR therapies including PT, OT, and SLP   Updated sister   2.  Atrial fibrillation: continue Eliquis             -antiplatelet therapy: aspirin    3. Knee pain: Tylenol, Robaxin as needed. Continue knee sleeve.    4. Mood/Behavior/Sleep: LCSW to evaluate and provide emotional support             -continue Lexapro             -antipsychotic agents: n/a   -6/2 night time restlessness and agitation this weekend   -UCX negative   -increase seroquel to 50mg  at 2000   -NEED to keep sleep chart 5. Neuropsych/cognition: This patient is not capable of making decisions on his own behalf.   6. Skin/Wound Care: Routine skin care checks   7. Fluids/Electrolytes/Nutrition/aphasia: Routine Is and Os and follow-up chemistries -On modified diet    8: Hypertension: monitor TID and prn; at home on tenoretic             -decrease atenolol to 12.5mg  daily.    6/2 bp still elevated this morning, increase atenolol to 25mg  daily 9: Hyperlipidemia: continue statin, Zetia   10: CAD s/p DES, 2018, 2019             -continue aspirin and statin,  Zetia, BB   11: pAF: continue Eliquis and BB   12: DM-2: at home on metformin 1000 mg BID, Humalog 75/25 40 units, Ozempic             -d/c ISS, monitor intake, CBGs QID             -Add carb controlled to dysphagia diet  -restart Ozempic at home  -placed nursing order to not give patient any juice or soda  Increase acarbose to 50mg  TID  Increase magnesium gluconate 500g HS  D/c insulin 13: Tobacco use: cessation counseling; continue Nicoderm   14: Hypothyroidism: continue Synthroid   15: Overweight: dietary/exercise  counseling, increase magneisum gluconate to 500mg  HS. Discussed restarting Ozempic at home   16: Left foot pain: possible non-displaced fracture vs. osseous remolding 5th toe             -continue Darco shoe, buddy wrap  17. Bradycardia: d/c atenolol  18. Hypokalemia: ordered klor once on 5/27, and on 6/3  19. Dizziness: d/c atenolol  20. Right knee buckling/chronic pain: knee sleeve ordered, Zynex Nexwave ordered for outpatient use  22. Nodule on anterior chest wall: nontender, discussed may be lipoma  23. Tachycardia: resolved, continue toprol XL HS  24. Insomnia: continue trazodone to 75mg  HS  25. HTN: continue toprol 12.5mg  XL HS, BP reviewed and is currently under excellent control  26. Mild cognitive impairment: discussed Namenda with his sister and she will discuss with her siblings as well, continue SLP, discussed that he could have early stages of vascular dementia   >30 minutes spent in discharge of patient including review of medications and follow-up appointments, physical examination, and in answering all patient's questions   LOS: 21 days A FACE TO FACE EVALUATION WAS PERFORMED  Clint Bolder P Gerianne Simonet 09/03/2022, 9:42 AM

## 2022-09-03 NOTE — Progress Notes (Signed)
Inpatient Rehabilitation Discharge Medication Review by a Pharmacist  A complete drug regimen review was completed for this patient to identify any potential clinically significant medication issues.  High Risk Drug Classes Is patient taking? Indication by Medication  Antipsychotic Yes Quetiapine - mood  Anticoagulant Yes Apixaban - Afib  Antibiotic No   Opioid No   Antiplatelet No   Hypoglycemics/insulin Yes Ozempic, metformin, acarbose, linagliptin - DM  Vasoactive Medication Yes Metoprolol -Afib  Chemotherapy No   Other Yes Ezetimibe - HLD Levothyroxine - low thyroid Rosuvastatin - HLD Trazodone - sleep     Type of Medication Issue Identified Description of Issue Recommendation(s)  Drug Interaction(s) (clinically significant)     Duplicate Therapy     Allergy     No Medication Administration End Date     Incorrect Dose     Additional Drug Therapy Needed     Significant med changes from prior encounter (inform family/care partners about these prior to discharge).    Other       Clinically significant medication issues were identified that warrant physician communication and completion of prescribed/recommended actions by midnight of the next day:  No  Pharmacist comments: None  Time spent performing this drug regimen review (minutes):  20 minutes  Thank you Okey Regal, PharmD

## 2022-09-03 NOTE — Progress Notes (Signed)
Speech Language Pathology Discharge Summary  Patient Details  Name: Anthony Woods MRN: [id number] Date of Birth: [birth year]  Date of Discharge from SLP service:September 02, 2022  Patient has met 5 of 5 long term goals.  Patient to discharge at overall Supervision;Min level.   Reasons goals not met: N/A   Clinical Impression/Discharge Summary: Patient has made functional gains and has met 5 of 5 LTGs this admission. Currently, patient is consuming Dys. 3 textures with thin liquids with minimal overt s/s of aspiration and overall Mod I for use of swallowing compensatory strategies. Patient demonstrates improved speech intelligibility and is ~90% intelligible at the phrase and sentence level with supervision level verbal cues for use of speech intelligibility strategies.  Patient also demonstrates improved cognitive functioning and requires supervision-Min A verbal cues to complete functional and mildly complex tasks safely in regards to recall with use of strategies, problem solving, and selective attention. Patient and family education is complete and patient will discharge home with 24 hour supervision from family. Patient would benefit from f/u SLP services to maximize his swallowing and cognitive functioning as well as his speech intelligibility in order to reduce caregiver burden.   Care Partner:  Caregiver Able to Provide Assistance: Yes  Type of Caregiver Assistance: Physical;Cognitive  Recommendation:  24 hour supervision/assistance;Home Health SLP  Rationale for SLP Follow Up: Maximize cognitive function and independence;Reduce caregiver burden;Maximize swallowing safety;Maximize functional communication   Equipment: N/A   Reasons for discharge: Treatment goals met   Patient/Family Agrees with Progress Made and Goals Achieved: Yes    [name] 09/03/2022, 6:33 AM

## 2022-09-03 NOTE — Plan of Care (Signed)
  Problem: RH Swallowing Goal: LTG Patient will consume least restrictive diet using compensatory strategies with assistance (SLP) Description: LTG:  Patient will consume least restrictive diet using compensatory strategies with assistance (SLP) Outcome: Completed/Met   Problem: RH Expression Communication Goal: LTG Patient will increase speech intelligibility (SLP) Description: LTG: Patient will increase speech intelligibility at word/phrase/conversation level with cues, % of the time (SLP) Outcome: Completed/Met   Problem: RH Problem Solving Goal: LTG Patient will demonstrate problem solving for (SLP) Description: LTG:  Patient will demonstrate problem solving for basic/complex daily situations with cues  (SLP) Outcome: Completed/Met   Problem: RH Memory Goal: LTG Patient will use memory compensatory aids to (SLP) Description: LTG:  Patient will use memory compensatory aids to recall biographical/new, daily complex information with cues (SLP) Outcome: Completed/Met   Problem: RH Attention Goal: LTG Patient will demonstrate this level of attention during functional activites (SLP) Description: LTG:  Patient will will demonstrate this level of attention during functional activites (SLP) Outcome: Completed/Met

## 2022-09-21 ENCOUNTER — Encounter: Payer: Medicare Other | Admitting: Physical Medicine and Rehabilitation

## 2022-09-22 ENCOUNTER — Emergency Department (HOSPITAL_COMMUNITY): Payer: Medicare Other

## 2022-09-22 ENCOUNTER — Inpatient Hospital Stay (HOSPITAL_COMMUNITY)
Admission: EM | Admit: 2022-09-22 | Discharge: 2022-09-25 | DRG: 310 | Disposition: A | Discharge status: Home Health | Payer: Medicare Other | Attending: Internal Medicine | Admitting: Internal Medicine

## 2022-09-22 ENCOUNTER — Other Ambulatory Visit: Payer: Self-pay

## 2022-09-22 ENCOUNTER — Encounter (HOSPITAL_COMMUNITY): Payer: Self-pay

## 2022-09-22 DIAGNOSIS — Z955 Presence of coronary angioplasty implant and graft: Secondary | ICD-10-CM | POA: Diagnosis not present

## 2022-09-22 DIAGNOSIS — Z7984 Long term (current) use of oral hypoglycemic drugs: Secondary | ICD-10-CM

## 2022-09-22 DIAGNOSIS — F419 Anxiety disorder, unspecified: Secondary | ICD-10-CM | POA: Diagnosis present

## 2022-09-22 DIAGNOSIS — I4819 Other persistent atrial fibrillation: Principal | ICD-10-CM | POA: Diagnosis present

## 2022-09-22 DIAGNOSIS — E1165 Type 2 diabetes mellitus with hyperglycemia: Secondary | ICD-10-CM | POA: Diagnosis not present

## 2022-09-22 DIAGNOSIS — Z79890 Hormone replacement therapy (postmenopausal): Secondary | ICD-10-CM

## 2022-09-22 DIAGNOSIS — F17210 Nicotine dependence, cigarettes, uncomplicated: Secondary | ICD-10-CM | POA: Diagnosis present

## 2022-09-22 DIAGNOSIS — I10 Essential (primary) hypertension: Secondary | ICD-10-CM | POA: Diagnosis not present

## 2022-09-22 DIAGNOSIS — I959 Hypotension, unspecified: Secondary | ICD-10-CM | POA: Diagnosis not present

## 2022-09-22 DIAGNOSIS — Z823 Family history of stroke: Secondary | ICD-10-CM

## 2022-09-22 DIAGNOSIS — E669 Obesity, unspecified: Secondary | ICD-10-CM | POA: Diagnosis present

## 2022-09-22 DIAGNOSIS — Z8673 Personal history of transient ischemic attack (TIA), and cerebral infarction without residual deficits: Secondary | ICD-10-CM

## 2022-09-22 DIAGNOSIS — Z7985 Long-term (current) use of injectable non-insulin antidiabetic drugs: Secondary | ICD-10-CM | POA: Diagnosis not present

## 2022-09-22 DIAGNOSIS — F39 Unspecified mood [affective] disorder: Secondary | ICD-10-CM | POA: Diagnosis not present

## 2022-09-22 DIAGNOSIS — E1169 Type 2 diabetes mellitus with other specified complication: Secondary | ICD-10-CM | POA: Diagnosis present

## 2022-09-22 DIAGNOSIS — E785 Hyperlipidemia, unspecified: Secondary | ICD-10-CM | POA: Diagnosis present

## 2022-09-22 DIAGNOSIS — Z6827 Body mass index (BMI) 27.0-27.9, adult: Secondary | ICD-10-CM

## 2022-09-22 DIAGNOSIS — Z91030 Bee allergy status: Secondary | ICD-10-CM

## 2022-09-22 DIAGNOSIS — R634 Abnormal weight loss: Secondary | ICD-10-CM | POA: Diagnosis present

## 2022-09-22 DIAGNOSIS — Z8249 Family history of ischemic heart disease and other diseases of the circulatory system: Secondary | ICD-10-CM | POA: Diagnosis not present

## 2022-09-22 DIAGNOSIS — I2510 Atherosclerotic heart disease of native coronary artery without angina pectoris: Secondary | ICD-10-CM | POA: Diagnosis present

## 2022-09-22 DIAGNOSIS — Z7982 Long term (current) use of aspirin: Secondary | ICD-10-CM

## 2022-09-22 DIAGNOSIS — Z7901 Long term (current) use of anticoagulants: Secondary | ICD-10-CM

## 2022-09-22 DIAGNOSIS — Z66 Do not resuscitate: Secondary | ICD-10-CM | POA: Diagnosis present

## 2022-09-22 DIAGNOSIS — E876 Hypokalemia: Secondary | ICD-10-CM | POA: Diagnosis present

## 2022-09-22 DIAGNOSIS — Z888 Allergy status to other drugs, medicaments and biological substances status: Secondary | ICD-10-CM

## 2022-09-22 DIAGNOSIS — R627 Adult failure to thrive: Secondary | ICD-10-CM | POA: Diagnosis present

## 2022-09-22 DIAGNOSIS — I4891 Unspecified atrial fibrillation: Secondary | ICD-10-CM

## 2022-09-22 DIAGNOSIS — E039 Hypothyroidism, unspecified: Secondary | ICD-10-CM | POA: Diagnosis not present

## 2022-09-22 DIAGNOSIS — Z79899 Other long term (current) drug therapy: Secondary | ICD-10-CM

## 2022-09-22 DIAGNOSIS — Z833 Family history of diabetes mellitus: Secondary | ICD-10-CM

## 2022-09-22 HISTORY — DX: Disorder of arteries and arterioles, unspecified: I77.9

## 2022-09-22 LAB — ECHOCARDIOGRAM LIMITED
Height: 65 in
S' Lateral: 3.1 cm
Weight: 2656 oz

## 2022-09-22 LAB — CBC
HCT: 41.2 % (ref 39.0–52.0)
Hemoglobin: 13.8 g/dL (ref 13.0–17.0)
MCH: 31.4 pg (ref 26.0–34.0)
MCHC: 33.5 g/dL (ref 30.0–36.0)
MCV: 93.8 fL (ref 80.0–100.0)
Platelets: 153 10*3/uL (ref 150–400)
RBC: 4.39 MIL/uL (ref 4.22–5.81)
RDW: 12 % (ref 11.5–15.5)
WBC: 6.5 10*3/uL (ref 4.0–10.5)
nRBC: 0 % (ref 0.0–0.2)

## 2022-09-22 LAB — TSH: TSH: 2.708 u[IU]/mL (ref 0.350–4.500)

## 2022-09-22 LAB — BASIC METABOLIC PANEL
Anion gap: 13 (ref 5–15)
BUN: 17 mg/dL (ref 8–23)
CO2: 25 mmol/L (ref 22–32)
Calcium: 9.3 mg/dL (ref 8.9–10.3)
Chloride: 98 mmol/L (ref 98–111)
Creatinine, Ser: 0.9 mg/dL (ref 0.61–1.24)
GFR, Estimated: >60 mL/min (ref >60)
Glucose, Bld: 311 mg/dL — ABNORMAL HIGH (ref 70–99)
Potassium: 3.3 mmol/L — ABNORMAL LOW (ref 3.5–5.1)
Sodium: 136 mmol/L (ref 135–145)

## 2022-09-22 LAB — HEPATIC FUNCTION PANEL
ALT: 84 U/L — ABNORMAL HIGH (ref 0–44)
AST: 69 U/L — ABNORMAL HIGH (ref 15–41)
Albumin: 2.9 g/dL — ABNORMAL LOW (ref 3.5–5.0)
Alkaline Phosphatase: 147 U/L — ABNORMAL HIGH (ref 38–126)
Bilirubin, Direct: 0.2 mg/dL (ref 0.0–0.2)
Indirect Bilirubin: 0.5 mg/dL (ref 0.3–0.9)
Total Bilirubin: 0.7 mg/dL (ref 0.3–1.2)
Total Protein: 6 g/dL — ABNORMAL LOW (ref 6.5–8.1)

## 2022-09-22 LAB — TROPONIN I (HIGH SENSITIVITY)
Troponin I (High Sensitivity): 11 ng/L (ref <18)
Troponin I (High Sensitivity): 26 ng/L — ABNORMAL HIGH (ref <18)
Troponin I (High Sensitivity): 25 ng/L — ABNORMAL HIGH (ref <18)

## 2022-09-22 LAB — GLUCOSE, CAPILLARY
Glucose-Capillary: 327 mg/dL — ABNORMAL HIGH (ref 70–99)
Glucose-Capillary: 272 mg/dL — ABNORMAL HIGH (ref 70–99)
Glucose-Capillary: 150 mg/dL — ABNORMAL HIGH (ref 70–99)

## 2022-09-22 LAB — MAGNESIUM: Magnesium: 1.7 mg/dL (ref 1.7–2.4)

## 2022-09-22 MED ORDER — INSULIN ASPART 100 UNIT/ML IJ SOLN
0.0000 [IU] | Freq: Every day | INTRAMUSCULAR | Status: DC
  Administered 2022-09-24: 4 [IU] via SUBCUTANEOUS

## 2022-09-22 MED ORDER — INSULIN ASPART 100 UNIT/ML IJ SOLN
0.0000 [IU] | Freq: Three times a day (TID) | INTRAMUSCULAR | Status: DC
  Administered 2022-09-22: 8 [IU] via SUBCUTANEOUS
  Administered 2022-09-22: 11 [IU] via SUBCUTANEOUS
  Administered 2022-09-23: 3 [IU] via SUBCUTANEOUS
  Administered 2022-09-23: 8 [IU] via SUBCUTANEOUS
  Administered 2022-09-23: 3 [IU] via SUBCUTANEOUS
  Administered 2022-09-24 – 2022-09-25 (×4): 5 [IU] via SUBCUTANEOUS

## 2022-09-22 MED ORDER — AMIODARONE LOAD VIA INFUSION
150.0000 mg | Freq: Once | INTRAVENOUS | Status: AC
  Administered 2022-09-22: 150 mg via INTRAVENOUS
  Filled 2022-09-22: qty 83.34

## 2022-09-22 MED ORDER — POTASSIUM CHLORIDE CRYS ER 20 MEQ PO TBCR
40.0000 meq | EXTENDED_RELEASE_TABLET | Freq: Once | ORAL | Status: AC
  Administered 2022-09-22: 40 meq via ORAL
  Filled 2022-09-22: qty 2

## 2022-09-22 MED ORDER — SODIUM CHLORIDE 0.9% FLUSH
3.0000 mL | Freq: Two times a day (BID) | INTRAVENOUS | Status: DC
  Administered 2022-09-22 – 2022-09-25 (×5): 3 mL via INTRAVENOUS

## 2022-09-22 MED ORDER — MAGNESIUM SULFATE 2 GM/50ML IV SOLN
2.0000 g | Freq: Once | INTRAVENOUS | Status: AC
  Administered 2022-09-22: 2 g via INTRAVENOUS
  Filled 2022-09-22: qty 50

## 2022-09-22 MED ORDER — METOPROLOL TARTRATE 5 MG/5ML IV SOLN: 10.0000 mg | Freq: Once | INTRAVENOUS | Status: DC

## 2022-09-22 MED ORDER — ONDANSETRON HCL 4 MG PO TABS: 4.0000 mg | ORAL_TABLET | Freq: Four times a day (QID) | ORAL | Status: DC | PRN

## 2022-09-22 MED ORDER — ROSUVASTATIN CALCIUM 20 MG PO TABS
20.0000 mg | ORAL_TABLET | Freq: Every day | ORAL | Status: DC
  Administered 2022-09-23 – 2022-09-25 (×3): 20 mg via ORAL
  Filled 2022-09-22 (×3): qty 1

## 2022-09-22 MED ORDER — AMIODARONE HCL IN DEXTROSE 360-4.14 MG/200ML-% IV SOLN
30.0000 mg/h | INTRAVENOUS | Status: DC
  Administered 2022-09-22 – 2022-09-24 (×5): 30 mg/h via INTRAVENOUS
  Filled 2022-09-22 (×5): qty 200

## 2022-09-22 MED ORDER — EZETIMIBE 10 MG PO TABS
10.0000 mg | ORAL_TABLET | Freq: Every day | ORAL | Status: DC
  Administered 2022-09-23 – 2022-09-25 (×3): 10 mg via ORAL
  Filled 2022-09-22 (×3): qty 1

## 2022-09-22 MED ORDER — ONDANSETRON HCL 4 MG/2ML IJ SOLN: 4.0000 mg | Freq: Four times a day (QID) | INTRAMUSCULAR | Status: DC | PRN

## 2022-09-22 MED ORDER — QUETIAPINE FUMARATE 50 MG PO TABS
50.0000 mg | ORAL_TABLET | Freq: Every day | ORAL | Status: DC
  Administered 2022-09-22 – 2022-09-24 (×3): 50 mg via ORAL
  Filled 2022-09-22 (×3): qty 1

## 2022-09-22 MED ORDER — NICOTINE 21 MG/24HR TD PT24
21.0000 mg | MEDICATED_PATCH | Freq: Every day | TRANSDERMAL | Status: DC
  Administered 2022-09-22 – 2022-09-24 (×3): 21 mg via TRANSDERMAL
  Filled 2022-09-22 (×4): qty 1

## 2022-09-22 MED ORDER — METOPROLOL TARTRATE 5 MG/5ML IV SOLN
INTRAVENOUS | Status: AC
  Administered 2022-09-22: 10 mg via INTRAVENOUS
  Filled 2022-09-22: qty 10

## 2022-09-22 MED ORDER — MELATONIN 3 MG PO TABS
3.0000 mg | ORAL_TABLET | Freq: Every day | ORAL | Status: DC
  Administered 2022-09-22 – 2022-09-24 (×3): 3 mg via ORAL
  Filled 2022-09-22 (×3): qty 1

## 2022-09-22 MED ORDER — LEVOTHYROXINE SODIUM 75 MCG PO TABS
175.0000 ug | ORAL_TABLET | Freq: Every day | ORAL | Status: DC
  Administered 2022-09-23 – 2022-09-25 (×3): 175 ug via ORAL
  Filled 2022-09-22 (×3): qty 1

## 2022-09-22 MED ORDER — ACETAMINOPHEN 325 MG PO TABS
650.0000 mg | ORAL_TABLET | Freq: Four times a day (QID) | ORAL | Status: DC | PRN
  Administered 2022-09-24: 650 mg via ORAL
  Filled 2022-09-22: qty 2

## 2022-09-22 MED ORDER — APIXABAN 5 MG PO TABS
5.0000 mg | ORAL_TABLET | Freq: Two times a day (BID) | ORAL | Status: DC
  Administered 2022-09-22 – 2022-09-25 (×7): 5 mg via ORAL
  Filled 2022-09-22 (×7): qty 1

## 2022-09-22 MED ORDER — TRAZODONE HCL 50 MG PO TABS
75.0000 mg | ORAL_TABLET | Freq: Every day | ORAL | Status: DC
  Administered 2022-09-22 – 2022-09-24 (×3): 75 mg via ORAL
  Filled 2022-09-22 (×3): qty 2

## 2022-09-22 MED ORDER — SODIUM CHLORIDE 0.9 % IV SOLN: INTRAVENOUS | Status: AC

## 2022-09-22 MED ORDER — ETOMIDATE 2 MG/ML IV SOLN
20.0000 mg | Freq: Once | INTRAVENOUS | Status: AC
  Administered 2022-09-22: 6 mg via INTRAVENOUS
  Filled 2022-09-22: qty 10

## 2022-09-22 MED ORDER — HYDRALAZINE HCL 20 MG/ML IJ SOLN: 5.0000 mg | INTRAMUSCULAR | Status: DC | PRN

## 2022-09-22 MED ORDER — AMIODARONE HCL IN DEXTROSE 360-4.14 MG/200ML-% IV SOLN
60.0000 mg/h | INTRAVENOUS | Status: AC
  Administered 2022-09-22: 60 mg/h via INTRAVENOUS
  Filled 2022-09-22 (×2): qty 200

## 2022-09-22 MED ORDER — ACETAMINOPHEN 650 MG RE SUPP: 650.0000 mg | Freq: Four times a day (QID) | RECTAL | Status: DC | PRN

## 2022-09-22 NOTE — Progress Notes (Signed)
Mg 1.7. Will order replacement for this as well.

## 2022-09-22 NOTE — H&P (Addendum)
History and Physical    Patient: Anthony Woods:[id number] DOB: 07/28/1955 DOA: 09/22/2022 DOS: the patient was seen and examined on 09/22/2022 PCP: Plotnikov, Georgina Quint, MD  Patient coming from: Home - lives with wife; NOK: Wife, Romond Nitzel, [phone]   Chief Complaint: chest pain, palpitations  HPI: Anthony Woods is a 67 y.o. male with medical history significant of CAD s/p stent, HTN, HLD, afib, CVA (07/2022), and DM presenting with chest pain/palpitations.  He reports that he awoke the other night and felt very weak.  His sugar was >200 which was unusual for him and EMS came.  He didn't notice chest pain or palpitations.  Denies SOB.  He denies attempts at cardioversion in the ER.     ER Course:  SOB, CP.  Found to have afib with RVR, tried to cardiovert twice without success.  No drip due to soft BPs.  Cardiology consulted, recommends [facility] admission.  On Amiodarone drip with mild improvement (HR 120s).  On Eliquis.     Review of Systems: As mentioned in the history of present illness. All other systems reviewed and are negative. Past Medical History:  Diagnosis Date   Allergy to bee sting    Anxiety    Arthritis    "right knee" (05/12/2016)   CAD (coronary artery disease)    a. DES to LCx 04/2016. b. DES to prox Cx 08/2017. c. Stable cath 09/2018 with nonobstructive residual disease, normal EF.   Carotid artery disease (HCC)    Headache    "when his sugar goes too high or too low" (05/12/2016)   HTN (hypertension)    Hyperlipidemia    Hypothyroidism    Macular degeneration    Obesity    PAF (paroxysmal atrial fibrillation) (HCC)    Type II diabetes mellitus (HCC)    Past Surgical History:  Procedure Laterality Date   CARDIAC CATHETERIZATION     COLONOSCOPY     CORONARY ANGIOPLASTY WITH STENT PLACEMENT  05/12/2016   CORONARY STENT INTERVENTION N/A 05/12/2016   Procedure: Coronary Stent Intervention;  Surgeon: Corky Crafts, MD;  Location: [facility] INVASIVE CV LAB;   Service: Cardiovascular;  Laterality: N/A;   CORONARY STENT INTERVENTION N/A 08/31/2017   Procedure: CORONARY STENT INTERVENTION;  Surgeon: Corky Crafts, MD;  Location: [facility] INVASIVE CV LAB;  Service: Cardiovascular;  Laterality: N/A;   GANGLION CYST EXCISION Left 1980s   KNEE ARTHROSCOPY Right    Meniscus removal   LEFT HEART CATH AND CORONARY ANGIOGRAPHY N/A 05/12/2016   Procedure: Left Heart Cath and Coronary Angiography;  Surgeon: Corky Crafts, MD;  Location: [facility] INVASIVE CV LAB;  Service: Cardiovascular;  Laterality: N/A;   LEFT HEART CATH AND CORONARY ANGIOGRAPHY N/A 08/31/2017   Procedure: LEFT HEART CATH AND CORONARY ANGIOGRAPHY;  Surgeon: Corky Crafts, MD;  Location: [facility] INVASIVE CV LAB;  Service: Cardiovascular;  Laterality: N/A;   LEFT HEART CATHETERIZATION WITH CORONARY ANGIOGRAM N/A 02/04/2012   Procedure: LEFT HEART CATHETERIZATION WITH CORONARY ANGIOGRAM;  Surgeon: Kathleene Hazel, MD;  Location: [facility] CATH LAB;  Service: Cardiovascular;  Laterality: N/A;   POLYPECTOMY     RIGHT/LEFT HEART CATH AND CORONARY ANGIOGRAPHY N/A 09/28/2018   Procedure: RIGHT/LEFT HEART CATH AND CORONARY ANGIOGRAPHY;  Surgeon: Kathleene Hazel, MD;  Location: [facility] INVASIVE CV LAB;  Service: Cardiovascular;  Laterality: N/A;   Social History:  reports that he has been smoking cigarettes. He has a 126.00 pack-year smoking history. He has never used smokeless tobacco. He reports that  he does not currently use alcohol. He reports that he does not use drugs.  Allergies  Allergen Reactions   Bee Venom Anaphylaxis    Other reaction(s): Unknown   Actos [Pioglitazone Hydrochloride] Swelling   Lipitor [Atorvastatin] Other (See Comments)    Patient cannot recall reaction   Lisinopril Other (See Comments)    cough    Family History  Problem Relation Age of Onset   Diabetes Mother    Cancer Mother        male ca   Colon cancer Mother 44   Diabetes Father    Cancer Father         stomach   CVA Father    Stomach cancer Father    Breast cancer Sister    Diabetes Sister    CAD Maternal Grandmother    CVA Paternal Uncle    Esophageal cancer Neg Hx    Rectal cancer Neg Hx     Prior to Admission medications   Medication Sig Start Date End Date Taking? Authorizing Provider  acarbose (PRECOSE) 50 MG tablet Take 1 tablet (50 mg total) by mouth 3 (three) times daily with meals. 09/03/22   Setzer, Lynnell Jude, PA-C  acetaminophen (TYLENOL) 325 MG tablet Take 1-2 tablets (325-650 mg total) by mouth every 6 (six) hours as needed for moderate pain or mild pain. 09/03/22   Setzer, Lynnell Jude, PA-C  apixaban (ELIQUIS) 5 MG TABS tablet Take 1 tablet (5 mg total) by mouth 2 (two) times daily. 09/03/22   Setzer, Lynnell Jude, PA-C  aspirin EC 81 MG tablet Take 1 tablet (81 mg total) by mouth daily for 30 days then as directed by MD .Swallow whole. 09/03/22   Setzer, Lynnell Jude, PA-C  Continuous Glucose Receiver (FREESTYLE LIBRE 3 READER) DEVI 1 each by Does not apply route daily. 09/01/22   Raulkar, Drema Pry, MD  Continuous Glucose Sensor (FREESTYLE LIBRE 3 SENSOR) MISC 1 each by Does not apply route daily. Place 1 sensor on the skin every 14 days. Use to check glucose continuously 09/01/22   Raulkar, Drema Pry, MD  Cyanocobalamin (B-12) 500 MCG TABS TAKE 1 TABLET (500 MCG TOTAL) BY MOUTH DAILY. 02/16/21   Plotnikov, Georgina Quint, MD  diphenhydrAMINE (BENADRYL) 25 mg capsule Take 25 mg by mouth as needed (bee stings).    [provider]  EPINEPHrine (EPIPEN 2-PAK) 0.3 mg/0.3 mL IJ SOAJ injection Inject 0.3 mg into the muscle as needed for anaphylaxis. 12/29/20   Plotnikov, Georgina Quint, MD  ezetimibe (ZETIA) 10 MG tablet Take 1 tablet (10 mg total) by mouth daily. 09/03/22   Setzer, Lynnell Jude, PA-C  levothyroxine (SYNTHROID) 175 MCG tablet Take 1 tablet (175 mcg total) by mouth daily at 6 (six) AM. 09/03/22   Setzer, Lynnell Jude, PA-C  linagliptin (TRADJENTA) 5 MG TABS tablet Take 1 tablet (5 mg total)  by mouth daily. 09/03/22   Setzer, Lynnell Jude, PA-C  magnesium oxide (MAG-OX) 400 (240 Mg) MG tablet Take 1 tablet (400 mg total) by mouth daily at 2 PM for 30 days then as directed by MD 09/03/22   Valetta Fuller, Lynnell Jude, PA-C  melatonin 3 MG TABS tablet Take 1 tablet (3 mg total) by mouth at bedtime. 09/03/22   Setzer, Lynnell Jude, PA-C  metFORMIN (GLUCOPHAGE) 1000 MG tablet Take 1 tablet (1,000 mg total) by mouth 2 (two) times daily with a meal. 09/03/22   Setzer, Lynnell Jude, PA-C  metoprolol succinate (TOPROL-XL) 25 MG 24 hr tablet Take 0.5 tablets (  12.5 mg total) by mouth daily. 09/03/22   Setzer, Lynnell Jude, PA-C  nicotine (NICODERM CQ - DOSED IN MG/24 HOURS) 21 mg/24hr patch Place 1 patch (21 mg total) onto the skin daily. 09/03/22   Setzer, Lynnell Jude, PA-C  nitroGLYCERIN (NITROSTAT) 0.4 MG SL tablet Place 1 tablet (0.4 mg total) under the tongue every 5 (five) minutes as needed for chest pain. 12/29/20   Plotnikov, Georgina Quint, MD  OZEMPIC, 0.25 OR 0.5 MG/DOSE, 2 MG/1.5ML SOPN Inject 0.5 mg into the skin once a week. 12/08/17   [provider]  QUEtiapine (SEROQUEL) 50 MG tablet Take 1 tablet (50 mg total) by mouth at bedtime. 09/03/22   Setzer, Lynnell Jude, PA-C  rosuvastatin (CRESTOR) 20 MG tablet Take 1 tablet (20 mg total) by mouth daily. 09/03/22   Setzer, Lynnell Jude, PA-C  traZODone (DESYREL) 150 MG tablet Take 0.5 tablets (75 mg total) by mouth at bedtime. 09/03/22   Milinda Antis, PA-C    Physical Exam: Vitals:   09/22/22 0915 09/22/22 1100 09/22/22 1252 09/22/22 1625  BP: 93/73 115/78 120/60 106/75  Pulse: (!) 103 71 (!) 121 (!) 121  Resp: 15  16 16   Temp:  98.3 F (36.8 C) 98 F (36.7 C) 98.4 F (36.9 C)  TempSrc:  Oral Oral Oral  SpO2: 96% 96% 95% 94%  Weight:      Height:       General:  Appears calm and comfortable and is in NAD, patient still confused post-sedation and minimally able to provide history Eyes:   EOMI, normal lids, iris ENT:  grossly normal hearing, lips & tongue, mmm;  poor dentition Neck:  no LAD, masses or thyromegaly Cardiovascular:  Irregularly irregular with tachycardia to 120-130, no m/r/g. No LE edema. Pacer pads in place. Respiratory:   CTA bilaterally with no wheezes/rales/rhonchi.  Normal respiratory effort. Abdomen:  soft, NT, ND Skin:  no rash or induration seen on limited exam Musculoskeletal:  grossly normal tone BUE/BLE, good ROM, no bony abnormality Psychiatric:  blunted/confused mood and affect, speech fluent and appropriate, AOx3 Neurologic:  CN 2-12 grossly intact, moves all extremities in coordinated fashion   Radiological Exams on Admission: Independently reviewed - see discussion in A/P where applicable  ECHOCARDIOGRAM LIMITED  Result Date: 09/22/2022    ECHOCARDIOGRAM LIMITED REPORT   Patient Name:   Anthony Woods Date of Exam: 09/22/2022 Medical Rec #:  [id number]      Height:       65.0 in Accession #:    [id number]     Weight:       166.0 lb Date of Birth:  1955/07/26      BSA:          1.828 m Patient Age:    67 years       BP:           97/65 mmHg Patient Gender: M              HR:           98 bpm. Exam Location:  Inpatient Procedure: Limited Echo and Limited Color Doppler Indications:     A fib  History:         Patient has prior history of Echocardiogram examinations, most                  recent 08/13/2022. Arrythmias:Atrial Fibrillation,                  Signs/Symptoms:Chest Pain;  Risk Factors:Hypertension, Diabetes                  and Current Smoker.  Sonographer:     Melissa Morford RDCS (AE, PE) Referring Phys:  [id number] Laurann [place] Diagnosing Phys: Epifanio Lesches MD IMPRESSIONS  1. Technically difficult study, in Afib with RVR  2. Left ventricular ejection fraction, by estimation, is 50 to 55%. The left ventricle has low normal function. The left ventricle has no regional wall motion abnormalities. There is moderate left ventricular hypertrophy.  3. Right ventricular systolic function is normal. The right ventricular size is  normal. Tricuspid regurgitation signal is inadequate for assessing PA pressure.  4. The mitral valve is normal in structure. Trivial mitral valve regurgitation.  5. The aortic valve was not well visualized. Aortic valve regurgitation is not visualized.  6. The inferior vena cava is normal in size with greater than 50% respiratory variability, suggesting right atrial pressure of 3 mmHg. Conclusion(s)/Recommendation(s): Moderate LVH on echo but low voltage on EKG, consider evaluation for cardiac amyloidosis. FINDINGS  Left Ventricle: Left ventricular ejection fraction, by estimation, is 50 to 55%. The left ventricle has low normal function. The left ventricle has no regional wall motion abnormalities. The left ventricular internal cavity size was normal in size. There is moderate left ventricular hypertrophy. Right Ventricle: The right ventricular size is normal. Right ventricular systolic function is normal. Tricuspid regurgitation signal is inadequate for assessing PA pressure. Pericardium: There is no evidence of pericardial effusion. Mitral Valve: The mitral valve is normal in structure. Trivial mitral valve regurgitation. Aortic Valve: The aortic valve was not well visualized. Aortic valve regurgitation is not visualized. Aorta: The aortic root is normal in size and structure. Venous: The inferior vena cava is normal in size with greater than 50% respiratory variability, suggesting right atrial pressure of 3 mmHg. LEFT VENTRICLE PLAX 2D LVIDd:         4.00 cm LVIDs:         3.10 cm LV PW:         1.20 cm LV IVS:        1.10 cm  LEFT ATRIUM         Index LA diam:    4.20 cm 2.30 cm/m   AORTA Ao Root diam: 3.40 cm Epifanio Lesches MD Electronically signed by Epifanio Lesches MD Signature Date/Time: 09/22/2022/11:41:44 AM    Final (Updated)    DG Chest Port 1 View  Result Date: 09/22/2022 CLINICAL DATA:  Chest pain and palpitations. EXAM: PORTABLE CHEST 1 VIEW COMPARISON:  08/11/2022 FINDINGS: Stable  cardiomediastinal contours. Aortic atherosclerotic calcifications. Decreased lung volumes. No pleural fluid, interstitial edema or airspace disease. Osseous structures appear grossly intact. IMPRESSION: Low lung volumes. No active disease. Electronically Signed   By: Signa Kell M.D.   On: 09/22/2022 05:44    EKG: Independently reviewed.   39 - Afib with rate 161 0544 - Afib with rate 156   Labs on Admission: I have personally reviewed the available labs and imaging studies at the time of the admission.  Pertinent labs:    K+ 3.3 Glucose 311 Mag++ 1.7 HS troponin 11, 26 Normal CBC   Assessment and Plan: Principal Problem:   Atrial fibrillation with RVR (HCC) Active Problems:   Hypothyroidism   Diabetes mellitus type 2 in obese   Essential hypertension   Coronary atherosclerosis   History of CVA (cerebrovascular accident)   Dyslipidemia   DNR (do not resuscitate)    Afib with  RVR -Patient with known afib presenting with RVR -He underwent attempts at electrical cardioversion x 2 without success and was then started on amiodarone infusion due to persistent borderline hypotension -Will admit to progressive care for amiodarone drip as per protocol -HS troponin negative; delta minimally elevated but <20 -Will request Echocardiogram for further evaluation  -Will consult cardiology  -CHA2DS2-VASc Score is >2 and so patient would benefit from oral anticoagulation.  -Continue home Eliquis.  HTN -Marginal BPs in the ER -Hold home metoprolol  -Will also add prn hydralazine  HLD -Continue Crestor, Zetia  DM -Last A1c was >15.5 - poor control -Hold home medications - acarbose, tradjenta, metformin, Ozempic -Will cover with moderate-scale SSI for now  CAD -s/p stent -On Eliquis so will hold ASA   CVA -in 07/2022 -On Eliquis -?need for concomitant ASA  Hypothyroidism -Continue Synthroid -TSH is now normal  Mood d/o -Continue Seroquel, Trazodone,  melatonin -PT/OT evaluations due to ?FTT raised by cardiology  Tobacco dependence -Encourage cessation.   -Patch ordered   DNR -ACP documents reviewed -DNR form present at bedside   Advance Care Planning:   Code Status: DNR   Consults: Cardiology; PT/OT  DVT Prophylaxis: Eliquis  Family Communication: None present; family had been present earlier and spoke with EDP and cardiology prior to my evaluation  Severity of Illness: The appropriate patient status for this patient is INPATIENT. Inpatient status is judged to be reasonable and necessary in order to provide the required intensity of service to ensure the patient's safety. The patient's presenting symptoms, physical exam findings, and initial radiographic and laboratory data in the context of their chronic comorbidities is felt to place them at high risk for further clinical deterioration. Furthermore, it is not anticipated that the patient will be medically stable for discharge from the hospital within 2 midnights of admission.   * I certify that at the point of admission it is my clinical judgment that the patient will require inpatient hospital care spanning beyond 2 midnights from the point of admission due to high intensity of service, high risk for further deterioration and high frequency of surveillance required.*  Author: Jonah Blue, MD 09/22/2022 6:59 PM  For on call review [email or web address].

## 2022-09-22 NOTE — Inpatient Diabetes Management (Signed)
Inpatient Diabetes Program Recommendations  AACE/ADA: New Consensus Statement on Inpatient Glycemic Control (2015)  Target Ranges:  Prepandial:   less than 140 mg/dL      Peak postprandial:   less than 180 mg/dL (1-2 hours)      Critically ill patients:  140 - 180 mg/dL   Lab Results  Component Value Date   GLUCAP 327 (H) 09/22/2022   HGBA1C >15.5 (H) 08/11/2022    Review of Glycemic Control  Diabetes history: DM 2 Outpatient Diabetes medications: Tradjenta 5 mg Daily, Metformin 1000 mg bid Current orders for Inpatient glycemic control:  Novolog 0-15 units tid + hs insulin just started near lunchtime today  Will follow glucose trends on sliding scale. When pt was in rehab after last hospitalization he needed minimal insulin and was discharged on orals only even though his A1c was >15.5%. However, per pt wife and sister has reverted back to old eating habits.  Spoke with pt, wife, and sister at bedside. Discussed A1c from previous admission. Discussed glucose and A1c goals. Reviewed lifestyle modifications changes needed to control glucose levels. Pt receives assistance from sister and wife with medications but it seems pt is responsible for checking his glucose. He reports at least a daily check but family also mentions he does not check the way he needs to. Even though pt participated in discussion, the sister and wife spoke more and patient listened. Pt seems to have certain things he likes to eat that are not the best of choices to eat. Encouraged compliance with diet and medications. Discussed complications with uncontrolled glucose levels.  Will monitor pt trends and determine needs at discharge.  Thanks, Christena Deem RN, MSN, BC-ADM Inpatient Diabetes Coordinator Team Pager [phone] (8a-5p)

## 2022-09-22 NOTE — ED Provider Notes (Cosign Needed Addendum)
[place] [facility] DEPARTMENT AT [facility] Provider Note   CSN: [id number] Arrival date & time: 09/22/22  0430     History  Chief Complaint  Patient presents with   Palpitations   Chest Pain    Anthony Woods is a 67 y.o. male. Patient presents to the emergency department via EMS complaining of chest pain. Patient states he woke up at 3:45AM with severe substernal chest pain. Upon arrival the patient was found to be in A-fib RVR with a rate variable from 150-240. Systolic blood pressure was found to be in the 80s. EMS administered 1,500 mL of normal saline. Pressures normalized. Patient continued to be in A-fib RVR. Patient currently denies chest pain. He does endorse a mild headache. Patient with stroke in May of this year. He has been on Eliquis since discharge on 5/24. He has a history of paroxysmal atrial fibrillation, type II DM, DNR, CAD  HPI     Home Medications Prior to Admission medications   Medication Sig Start Date End Date Taking? Authorizing Provider  acarbose (PRECOSE) 50 MG tablet Take 1 tablet (50 mg total) by mouth 3 (three) times daily with meals. 09/03/22   Setzer, Lynnell Jude, PA-C  acetaminophen (TYLENOL) 325 MG tablet Take 1-2 tablets (325-650 mg total) by mouth every 6 (six) hours as needed for moderate pain or mild pain. 09/03/22   Setzer, Lynnell Jude, PA-C  apixaban (ELIQUIS) 5 MG TABS tablet Take 1 tablet (5 mg total) by mouth 2 (two) times daily. 09/03/22   Setzer, Lynnell Jude, PA-C  aspirin EC 81 MG tablet Take 1 tablet (81 mg total) by mouth daily for 30 days then as directed by MD .Swallow whole. 09/03/22   Setzer, Lynnell Jude, PA-C  Continuous Glucose Receiver (FREESTYLE LIBRE 3 READER) DEVI 1 each by Does not apply route daily. 09/01/22   Raulkar, Drema Pry, MD  Continuous Glucose Sensor (FREESTYLE LIBRE 3 SENSOR) MISC 1 each by Does not apply route daily. Place 1 sensor on the skin every 14 days. Use to check glucose continuously 09/01/22   Raulkar, Drema Pry, MD  Cyanocobalamin (B-12) 500 MCG TABS TAKE 1 TABLET (500 MCG TOTAL) BY MOUTH DAILY. 02/16/21   Plotnikov, Georgina Quint, MD  diphenhydrAMINE (BENADRYL) 25 mg capsule Take 25 mg by mouth as needed (bee stings).    [provider]  EPINEPHrine (EPIPEN 2-PAK) 0.3 mg/0.3 mL IJ SOAJ injection Inject 0.3 mg into the muscle as needed for anaphylaxis. 12/29/20   Plotnikov, Georgina Quint, MD  ezetimibe (ZETIA) 10 MG tablet Take 1 tablet (10 mg total) by mouth daily. 09/03/22   Setzer, Lynnell Jude, PA-C  levothyroxine (SYNTHROID) 175 MCG tablet Take 1 tablet (175 mcg total) by mouth daily at 6 (six) AM. 09/03/22   Setzer, Lynnell Jude, PA-C  linagliptin (TRADJENTA) 5 MG TABS tablet Take 1 tablet (5 mg total) by mouth daily. 09/03/22   Setzer, Lynnell Jude, PA-C  magnesium oxide (MAG-OX) 400 (240 Mg) MG tablet Take 1 tablet (400 mg total) by mouth daily at 2 PM for 30 days then as directed by MD 09/03/22   Valetta Fuller, Lynnell Jude, PA-C  melatonin 3 MG TABS tablet Take 1 tablet (3 mg total) by mouth at bedtime. 09/03/22   Setzer, Lynnell Jude, PA-C  metFORMIN (GLUCOPHAGE) 1000 MG tablet Take 1 tablet (1,000 mg total) by mouth 2 (two) times daily with a meal. 09/03/22   Setzer, Lynnell Jude, PA-C  metoprolol succinate (TOPROL-XL) 25 MG 24 hr tablet  Take 0.5 tablets (12.5 mg total) by mouth daily. 09/03/22   Setzer, Lynnell Jude, PA-C  nicotine (NICODERM CQ - DOSED IN MG/24 HOURS) 21 mg/24hr patch Place 1 patch (21 mg total) onto the skin daily. 09/03/22   Setzer, Lynnell Jude, PA-C  nitroGLYCERIN (NITROSTAT) 0.4 MG SL tablet Place 1 tablet (0.4 mg total) under the tongue every 5 (five) minutes as needed for chest pain. 12/29/20   Plotnikov, Georgina Quint, MD  OZEMPIC, 0.25 OR 0.5 MG/DOSE, 2 MG/1.5ML SOPN Inject 0.5 mg into the skin once a week. 12/08/17   [provider]  QUEtiapine (SEROQUEL) 50 MG tablet Take 1 tablet (50 mg total) by mouth at bedtime. 09/03/22   Setzer, Lynnell Jude, PA-C  rosuvastatin (CRESTOR) 20 MG tablet Take 1 tablet (20 mg  total) by mouth daily. 09/03/22   Setzer, Lynnell Jude, PA-C  traZODone (DESYREL) 150 MG tablet Take 0.5 tablets (75 mg total) by mouth at bedtime. 09/03/22   Setzer, Lynnell Jude, PA-C      Allergies    Bee venom, Actos [pioglitazone hydrochloride], Lipitor [atorvastatin], and Lisinopril    Review of Systems   Review of Systems  Physical Exam Updated Vital Signs BP (!) 98/57   Pulse (!) 135   Temp 97.7 F (36.5 C) (Oral)   Resp 20   Ht 5\' 5"  (1.651 m)   Wt 75.3 kg   SpO2 100%   BMI 27.62 kg/m  Physical Exam Vitals and nursing note reviewed.  Constitutional:      General: He is not in acute distress.    Appearance: He is well-developed.  HENT:     Head: Normocephalic and atraumatic.  Eyes:     Conjunctiva/sclera: Conjunctivae normal.  Cardiovascular:     Rate and Rhythm: Tachycardia present. Rhythm irregular.     Pulses:          Radial pulses are 2+ on the right side and 2+ on the left side.     Heart sounds: No murmur heard. Pulmonary:     Effort: Pulmonary effort is normal. No respiratory distress.     Breath sounds: Normal breath sounds.  Chest:     Chest wall: No tenderness.  Abdominal:     Palpations: Abdomen is soft.     Tenderness: There is no abdominal tenderness.  Musculoskeletal:        General: No swelling.     Cervical back: Neck supple.     Right lower leg: No edema.     Left lower leg: No edema.  Skin:    General: Skin is warm and dry.     Capillary Refill: Capillary refill takes less than 2 seconds.  Neurological:     Mental Status: He is alert.  Psychiatric:        Mood and Affect: Mood normal.     ED Results / Procedures / Treatments   Labs (all labs ordered are listed, but only abnormal results are displayed) Labs Reviewed  BASIC METABOLIC PANEL - Abnormal; Notable for the following components:      Result Value   Potassium 3.3 (*)    Glucose, Bld 311 (*)    All other components within normal limits  CBC  TROPONIN I (HIGH SENSITIVITY)   TROPONIN I (HIGH SENSITIVITY)    EKG None  Radiology DG Chest Port 1 View  Result Date: 09/22/2022 CLINICAL DATA:  Chest pain and palpitations. EXAM: PORTABLE CHEST 1 VIEW COMPARISON:  08/11/2022 FINDINGS: Stable cardiomediastinal contours. Aortic atherosclerotic calcifications. Decreased  lung volumes. No pleural fluid, interstitial edema or airspace disease. Osseous structures appear grossly intact. IMPRESSION: Low lung volumes. No active disease. Electronically Signed   By: Signa Kell M.D.   On: 09/22/2022 05:44    Procedures .Cardioversion  Date/Time: 09/22/2022 5:43 AM  Performed by: Darrick Grinder, PA-C Authorized by: Pamala Duffel   Consent:    Consent obtained:  Verbal   Consent given by:  Patient and spouse   Risks discussed:  Cutaneous burn, death, induced arrhythmia and pain   Alternatives discussed:  Rate-control medication Pre-procedure details:    Cardioversion basis:  Emergent   Rhythm:  Atrial fibrillation   Electrode placement:  Anterior-posterior Patient sedated: Yes. Refer to sedation procedure documentation for details of sedation.  Attempt one:    Cardioversion mode:  Synchronous   Waveform:  Biphasic   Shock (joules) attempt one: 200.   Shock outcome:  No change in rhythm Attempt two:    Cardioversion mode:  Synchronous   Waveform:  Biphasic   Shock (joules) attempt two: 200.   Shock outcome:  No change in rhythm Post-procedure details:    Patient status:  Alert   Patient tolerance of procedure:  Tolerated well, no immediate complications .Critical Care  Performed by: Darrick Grinder, PA-C Authorized by: Darrick Grinder, PA-C   Critical care provider statement:    Critical care time (minutes):  30   Critical care was necessary to treat or prevent imminent or life-threatening deterioration of the following conditions:  Cardiac failure   Critical care was time spent personally by me on the following activities:  Development of  treatment plan with patient or surrogate, discussions with consultants, evaluation of patient's response to treatment, examination of patient, ordering and review of laboratory studies, ordering and review of radiographic studies, ordering and performing treatments and interventions, pulse oximetry, re-evaluation of patient's condition and review of old charts     Medications Ordered in ED Medications  etomidate (AMIDATE) injection 20 mg (6 mg Intravenous Given 09/22/22 0539)    ED Course/ Medical Decision Making/ A&P                             Medical Decision Making Amount and/or Complexity of Data Reviewed Labs: ordered. Radiology: ordered.  Risk Prescription drug management. Decision regarding hospitalization.   This patient presents to the ED for concern of chest pain, this involves an extensive number of treatment options, and is a complaint that carries with it a high risk of complications and morbidity.  The patient was found in A-fib RVR but based on his chest pain differential would also include ACS, PE, and others   Co morbidities that complicate the patient evaluation  History of paroxysmal atrial fibrillation on chronic anticoagulation   Additional history obtained:  Additional history obtained from EMS and patient's wife External records from outside source obtained and reviewed including 6   Lab Tests:  I Ordered, and personally interpreted labs.  The pertinent results include: Unremarkable CBC   Imaging Studies ordered:  I ordered imaging studies including chest x-ray I independently visualized and interpreted imaging which showed no active disease I agree with the radiologist interpretation   Cardiac Monitoring: / EKG:  The patient was maintained on a cardiac monitor.  I personally viewed and interpreted the cardiac monitored which showed an underlying rhythm of: Atrial fibrillation with RVR   Consultations Obtained:  I requested consultation  with the  cardiologist on-call,  and discussed lab and imaging findings as well as pertinent plan -they plan to come down to the emergency department to see the patient   Problem List / ED Course / Critical interventions / Medication management   I ordered medication including metoprolol for rate control  Reevaluation of the patient after these medicines showed that the patient stayed the same I have reviewed the patients home medicines and have made adjustments as needed   Test / Admission - Considered:  Patient care being transferred to [facility], PA-C at shift handoff.  Patient with A-fib RVR and unstable blood pressures.  Will likely need admission but final disposition pending recommendations of cardiology.         Final Clinical Impression(s) / ED Diagnoses Final diagnoses:  Atrial fibrillation with RVR [facility])    Rx / DC Orders ED Discharge Orders     None         Pamala Duffel 09/22/22 1610    Darrick Grinder, PA-C 09/22/22 2210    Shon Baton, MD 09/23/22 [phone]

## 2022-09-22 NOTE — Plan of Care (Signed)

## 2022-09-22 NOTE — ED Provider Notes (Signed)
Received handoff from [facility], pending cardiology recommendations.  Attempted to cardiovert patient twice, and pressures are low thus he was not started on a drip.  Cardiology to see and further disposition.  Complaint of chest pain in A-fib RVR. Physical Exam  BP (!) 98/57   Pulse (!) 135   Temp 97.7 F (36.5 C) (Oral)   Resp 20   Ht 5\' 5"  (1.651 m)   Wt 75.3 kg   SpO2 100%   BMI 27.62 kg/m   Physical Exam Vitals and nursing note reviewed.  Constitutional:      General: He is not in acute distress.    Appearance: He is well-developed.  HENT:     Head: Normocephalic and atraumatic.  Eyes:     Conjunctiva/sclera: Conjunctivae normal.  Cardiovascular:     Rate and Rhythm: Tachycardia present. Rhythm irregular.     Heart sounds: No murmur heard. Pulmonary:     Effort: Pulmonary effort is normal. No respiratory distress.     Breath sounds: Normal breath sounds.  Abdominal:     Palpations: Abdomen is soft.     Tenderness: There is no abdominal tenderness.  Musculoskeletal:        General: No swelling.     Cervical back: Neck supple.  Skin:    General: Skin is warm and dry.     Capillary Refill: Capillary refill takes less than 2 seconds.  Neurological:     Mental Status: He is alert.  Psychiatric:        Mood and Affect: Mood normal.     Procedures  Procedures  ED Course / MDM    Medical Decision Making Amount and/or Complexity of Data Reviewed Labs: ordered. Radiology: ordered.  Risk Prescription drug management. Decision regarding hospitalization.   Discussed with Dr. Clifton James, he states they will round on patient, likely admission.  Start patient on IV amiodarone with bolus.  Evaluated by cardiology, they recommend hospitalist admission given the comorbidities.  Patient has been started on amiodarone drip, and heart rate is improving.  It was previously 130s, now down to 120s.  Discussed with Dr. Ophelia Charter, she accepts admission of the patient.  Cardiology will  follow further follow.  Admitted for A-fib RVR requiring IV amiodarone, given refractory to cardioversion x 2, pressure soft.  IV fluids ordered.       Pete Pelt, [place] 09/22/22 1531    Gwyneth Sprout, MD 09/23/22 [phone]

## 2022-09-22 NOTE — TOC CM/SW Note (Signed)
Transition of Care [facility]) - Inpatient Brief Assessment   Patient Details  Name: Anthony Woods MRN: [id number] Date of Birth: 07/05/1955  [facility]) CM/SW Contact:    Gala Lewandowsky, RN Phone Number: 09/22/2022, 4:02 PM   Clinical Narrative: Patient presented for chest pain and palpitations and was found to be in Atrial Fib with RVR. PTA patient was from home with spouse. Spouse was at the bedside and she states patient has DME:[employer], cane, rolling walker, and she has ordered a wheelchair from [employer] that will be delivered to the home soon. Patient has PCP and gets to appointments without any issues.   Transition of Care Asessment: Insurance and Status: Insurance coverage has been reviewed Patient has primary care physician: Yes Home environment has been reviewed: reviewed Prior level of function:: independent Prior/Current Home Services: No current home services Social Determinants of Health Reivew: SDOH reviewed no interventions necessary Readmission risk has been reviewed: Yes Transition of care needs: no transition of care needs at this time

## 2022-09-22 NOTE — ED Triage Notes (Signed)
Patient BIB [facility] from home with complaint of crushing chest pain & palpitations of heart rate140-220 80/66 initial BP.   18g L AC 324 ASA 1.5L NS  Patient A & O x 4 on arrival

## 2022-09-22 NOTE — Consult Note (Addendum)
Cardiology Consultation   Patient ID: CANNAN DEMORE MRN: [id number]; DOB: 10-11-1955  Admit date: 09/22/2022 Date of Consult: 09/22/2022  PCP:  Tresa Garter, MD   [facility] Providers Cardiologist:  Verne Carrow, MD        Patient Profile:   Anthony Woods is a 67 y.o. male with a hx of  paroxsmal atrial fibrillation, CAD (DES to LCx 04/2016, DES to prox Cx 08/2017), prior bradycardia, recent stroke, carotid artery disease (40% RICA by CTA 07/2022), HTN, DM, obesity, and tobacco abuse who is being seen 09/22/2022 for the evaluation of atrial fibrillation at the request of [employer].  History of Present Illness:   Anthony Woods follows with Dr. Clifton James. He was admitted 2018 with chest pain and was found to be in atrial fibrillation. He was treated with diltiazem and converted to sinus bradycardia. Cath during admission showed severe Cx stenosis treated with DES. He was discharged on Plavix and Xarelto. He developed recurrent chest pain in 2019. Cardiac cath 08/31/17 showed severe stenosis in the proximal circumflex treated with DES with otherwise nonobstructive disease and patent Cx stent. He was discharged on ASA (1 month) + Plavix + Xarelto. Last cath was in 2020 for chest pain/dyspnea showing mild nonobstructive disease in LAD, mild-moderate disease in RCA unchanged from last cath, patent stents in Cx, normal filling pressures. Sleep study was previously recommended but not pursued by patient. He was last seen in our office in 2022 and was in NSR. Med compliance was encouraged as he was periodically missing medications. He was recently admitted 5/22-5/24/24 with acute embolic stroke in setting of medication noncompliance. He had reported not taken any medicines since January of this year. He was in NSR by admission EKG. He was originally advised to continue ASA + Xarelto. Xarelto was changed to Eliquis due to better insurance coverage.  Echo was technically difficult  but showed EF 60-65%, mild LVH, normal RV, no significant valve problems. His TSH was elevated likely due to noncompliance with thyroid medication. He also had possible non-displaced fracture of the 5th toe vs remolding treated conservatively. He was in CIR from 5/24-6/14/24, pulse in the 60s by last note.    He is accompanied by his wife and sister. He is not particularly engaged in the conversation so his family provides the history. Apparently he has not felt well for a few days, particularly sleeping a lot, and then more lethargic yesterday. His oral intake has been variable, less than normal. He started smoking and drinking Mtn Dew again but wife reports he had been adherent to his medications. Overnight he woke his wife up to go to the bathroom and complained of some chest pain. He is unable to describe this further, "I don't know, it just hurt." He had palpitations but no other associated symptoms. EMS was called. He was found to be tachycardic 140-220bpm with intial BP 80/66. He received 324mg  ASA and 1.5L NS. He was in AF RVR on arrival to ED. The ED attempted DCCV x2 which was unsuccessful. He then received 10mg  IV with some improvement in HR to the 130-140s. Labs show normal CBC, hsTroponin neg x1, glucose 311, K 3.3. CXR with low lung volumes and NAD. He remains tachycardic in the 130s, SBP low 90s systolic. Denies any acute complaints presently. He states the chest pain resolved "when they gave me that medicine."  He also appears to have lost a substantial amount of weight over the last few years -  was previously in the 240s in 2021, here at 166lb.  Past Medical History:  Diagnosis Date   Allergy to bee sting    Anxiety    Arthritis    "right knee" (05/12/2016)   CAD (coronary artery disease)    a. DES to LCx 04/2016. b. DES to prox Cx 08/2017. c. Stable cath 09/2018 with nonobstructive residual disease, normal EF.   Carotid artery disease (HCC)    Headache    "when his sugar goes too high  or too low" (05/12/2016)   HTN (hypertension)    Hyperlipidemia    Hypothyroidism    Macular degeneration    Obesity    PAF (paroxysmal atrial fibrillation) (HCC)    Type II diabetes mellitus (HCC)     Past Surgical History:  Procedure Laterality Date   CARDIAC CATHETERIZATION     COLONOSCOPY     CORONARY ANGIOPLASTY WITH STENT PLACEMENT  05/12/2016   CORONARY STENT INTERVENTION N/A 05/12/2016   Procedure: Coronary Stent Intervention;  Surgeon: Corky Crafts, MD;  Location: [facility] INVASIVE CV LAB;  Service: Cardiovascular;  Laterality: N/A;   CORONARY STENT INTERVENTION N/A 08/31/2017   Procedure: CORONARY STENT INTERVENTION;  Surgeon: Corky Crafts, MD;  Location: [facility] INVASIVE CV LAB;  Service: Cardiovascular;  Laterality: N/A;   GANGLION CYST EXCISION Left 1980s   KNEE ARTHROSCOPY Right    Meniscus removal   LEFT HEART CATH AND CORONARY ANGIOGRAPHY N/A 05/12/2016   Procedure: Left Heart Cath and Coronary Angiography;  Surgeon: Corky Crafts, MD;  Location: [facility] INVASIVE CV LAB;  Service: Cardiovascular;  Laterality: N/A;   LEFT HEART CATH AND CORONARY ANGIOGRAPHY N/A 08/31/2017   Procedure: LEFT HEART CATH AND CORONARY ANGIOGRAPHY;  Surgeon: Corky Crafts, MD;  Location: [facility] INVASIVE CV LAB;  Service: Cardiovascular;  Laterality: N/A;   LEFT HEART CATHETERIZATION WITH CORONARY ANGIOGRAM N/A 02/04/2012   Procedure: LEFT HEART CATHETERIZATION WITH CORONARY ANGIOGRAM;  Surgeon: Kathleene Hazel, MD;  Location: [facility] CATH LAB;  Service: Cardiovascular;  Laterality: N/A;   POLYPECTOMY     RIGHT/LEFT HEART CATH AND CORONARY ANGIOGRAPHY N/A 09/28/2018   Procedure: RIGHT/LEFT HEART CATH AND CORONARY ANGIOGRAPHY;  Surgeon: Kathleene Hazel, MD;  Location: [facility] INVASIVE CV LAB;  Service: Cardiovascular;  Laterality: N/A;     Home Medications:  Prior to Admission medications   Medication Sig Start Date End Date Taking? Authorizing Provider  acarbose (PRECOSE) 50 MG  tablet Take 1 tablet (50 mg total) by mouth 3 (three) times daily with meals. 09/03/22   Setzer, Lynnell Jude, PA-C  acetaminophen (TYLENOL) 325 MG tablet Take 1-2 tablets (325-650 mg total) by mouth every 6 (six) hours as needed for moderate pain or mild pain. 09/03/22   Setzer, Lynnell Jude, PA-C  apixaban (ELIQUIS) 5 MG TABS tablet Take 1 tablet (5 mg total) by mouth 2 (two) times daily. 09/03/22   Setzer, Lynnell Jude, PA-C  aspirin EC 81 MG tablet Take 1 tablet (81 mg total) by mouth daily for 30 days then as directed by MD .Swallow whole. 09/03/22   Setzer, Lynnell Jude, PA-C  Continuous Glucose Receiver (FREESTYLE LIBRE 3 READER) DEVI 1 each by Does not apply route daily. 09/01/22   Raulkar, Drema Pry, MD  Continuous Glucose Sensor (FREESTYLE LIBRE 3 SENSOR) MISC 1 each by Does not apply route daily. Place 1 sensor on the skin every 14 days. Use to check glucose continuously 09/01/22   Raulkar, Drema Pry, MD  Cyanocobalamin (B-12) 500 MCG TABS TAKE  1 TABLET (500 MCG TOTAL) BY MOUTH DAILY. 02/16/21   Plotnikov, Georgina Quint, MD  diphenhydrAMINE (BENADRYL) 25 mg capsule Take 25 mg by mouth as needed (bee stings).    [provider]  EPINEPHrine (EPIPEN 2-PAK) 0.3 mg/0.3 mL IJ SOAJ injection Inject 0.3 mg into the muscle as needed for anaphylaxis. 12/29/20   Plotnikov, Georgina Quint, MD  ezetimibe (ZETIA) 10 MG tablet Take 1 tablet (10 mg total) by mouth daily. 09/03/22   Setzer, Lynnell Jude, PA-C  levothyroxine (SYNTHROID) 175 MCG tablet Take 1 tablet (175 mcg total) by mouth daily at 6 (six) AM. 09/03/22   Setzer, Lynnell Jude, PA-C  linagliptin (TRADJENTA) 5 MG TABS tablet Take 1 tablet (5 mg total) by mouth daily. 09/03/22   Setzer, Lynnell Jude, PA-C  magnesium oxide (MAG-OX) 400 (240 Mg) MG tablet Take 1 tablet (400 mg total) by mouth daily at 2 PM for 30 days then as directed by MD 09/03/22   Valetta Fuller, Lynnell Jude, PA-C  melatonin 3 MG TABS tablet Take 1 tablet (3 mg total) by mouth at bedtime. 09/03/22   Setzer, Lynnell Jude, PA-C   metFORMIN (GLUCOPHAGE) 1000 MG tablet Take 1 tablet (1,000 mg total) by mouth 2 (two) times daily with a meal. 09/03/22   Setzer, Lynnell Jude, PA-C  metoprolol succinate (TOPROL-XL) 25 MG 24 hr tablet Take 0.5 tablets (12.5 mg total) by mouth daily. 09/03/22   Setzer, Lynnell Jude, PA-C  nicotine (NICODERM CQ - DOSED IN MG/24 HOURS) 21 mg/24hr patch Place 1 patch (21 mg total) onto the skin daily. 09/03/22   Setzer, Lynnell Jude, PA-C  nitroGLYCERIN (NITROSTAT) 0.4 MG SL tablet Place 1 tablet (0.4 mg total) under the tongue every 5 (five) minutes as needed for chest pain. 12/29/20   Plotnikov, Georgina Quint, MD  OZEMPIC, 0.25 OR 0.5 MG/DOSE, 2 MG/1.5ML SOPN Inject 0.5 mg into the skin once a week. 12/08/17   [provider]  QUEtiapine (SEROQUEL) 50 MG tablet Take 1 tablet (50 mg total) by mouth at bedtime. 09/03/22   Setzer, Lynnell Jude, PA-C  rosuvastatin (CRESTOR) 20 MG tablet Take 1 tablet (20 mg total) by mouth daily. 09/03/22   Setzer, Lynnell Jude, PA-C  traZODone (DESYREL) 150 MG tablet Take 0.5 tablets (75 mg total) by mouth at bedtime. 09/03/22   Milinda Antis, PA-C    Inpatient Medications: Scheduled Meds:  Continuous Infusions:  sodium chloride     amiodarone 60 mg/hr (09/22/22 0821)   amiodarone     PRN Meds:   Allergies:    Allergies  Allergen Reactions   Bee Venom Anaphylaxis    Other reaction(s): Unknown   Actos [Pioglitazone Hydrochloride] Swelling   Lipitor [Atorvastatin] Other (See Comments)    Patient cannot recall reaction   Lisinopril Other (See Comments)    cough    Social History:   Social History   Socioeconomic History   Marital status: Married    Spouse name: Not on file   Number of children: 1   Years of education: Not on file   Highest education level: Not on file  Occupational History   Occupation: [employer]   Occupation: GRAVE DIGGER    Employer: [employer]    Comment: retired  Tobacco Use   Smoking status: Every Day    Packs/day: 3.00     Years: 42.00    Additional pack years: 0.00    Total pack years: 126.00    Types: Cigarettes   Smokeless tobacco: Never  Vaping Use   Vaping Use: Never used  Substance and Sexual Activity   Alcohol use: No   Drug use: No   Sexual activity: Not Currently  Other Topics Concern   Not on file  Social History Narrative   Not on file   Social Determinants of Health   Financial Resource Strain: Not on file  Food Insecurity: Not on file  Transportation Needs: Not on file  Physical Activity: Not on file  Stress: Not on file  Social Connections: Not on file  Intimate Partner Violence: Not on file    Family History:   Family History  Problem Relation Age of Onset   Diabetes Mother    Cancer Mother        male ca   Colon cancer Mother 58   Diabetes Father    Cancer Father        stomach   CVA Father    Stomach cancer Father    Breast cancer Sister    Diabetes Sister    CAD Maternal Grandmother    CVA Paternal Uncle    Esophageal cancer Neg Hx    Rectal cancer Neg Hx      ROS:  Please see the history of present illness.  All other ROS reviewed and negative.     Physical Exam/Data:   Vitals:   09/22/22 0800 09/22/22 0815 09/22/22 0816 09/22/22 0830  BP: (!) 90/57 (!) 90/57 (!) 90/57 (!) 94/56  Pulse: (!) 141 (!) 122 (!) 131 (!) 124  Resp: (!) 28 (!) 30 16 20   Temp:      TempSrc:      SpO2: 100% 99% 99% 100%  Weight:      Height:       No intake or output data in the 24 hours ending 09/22/22 0837    09/22/2022    4:39 AM 09/01/2022    6:36 PM 08/13/2022    1:32 PM  Last 3 Weights  Weight (lbs) 166 lb 171 lb 8.3 oz 169 lb 12.1 oz  Weight (kg) 75.297 kg 77.8 kg 77 kg     Body mass index is 27.62 kg/m.   General: Sallow complexion, in no acute distress. Sleeping when I walked in, easily aroused. Head: Normocephalic, atraumatic, sclera non-icteric, no xanthomas, nares are without discharge. Neck: Negative for carotid bruits. JVP not elevated. Lungs: Clear  bilaterally to auscultation without wheezes, rales, or rhonchi. Breathing is unlabored. Heart: Irregularly irregular, tachycardic, S1 S2 without murmurs, rubs, or gallops.  Abdomen: Soft, non-tender, non-distended with normoactive bowel sounds. No rebound/guarding. Extremities: No clubbing or cyanosis. No edema. Distal pedal pulses are 2+ and equal bilaterally. Neuro: Alert and oriented X 3, took a moment to remember the year. Moves all extremities spontaneously. Psych: Flat affect   EKG:  The ECGs done today were personally reviewed and demonstrate: Several tracings reviewed, all of which show AF RVR with nonspecific STTW changes, no STEMI Telemetry:  Telemetry was personally reviewed and demonstrates:  AF RVR  Relevant CV Studies:  2d echo 08/13/22   1. Technically difficult study with limited visualization of cardiac  structures.   2. Left ventricular ejection fraction, by estimation, is 60 to 65%. The  left ventricle has normal function. The left ventricle has no regional  wall motion abnormalities. There is mild concentric left ventricular  hypertrophy. Left ventricular diastolic  function could not be evaluated.   3. Right ventricular systolic function is normal. The right ventricular  size is normal.  4. The mitral valve is normal in structure. No evidence of mitral valve  regurgitation. No evidence of mitral stenosis.   5. The aortic valve is normal in structure. Aortic valve regurgitation is  not visualized. No aortic stenosis is present.   6. The inferior vena cava is normal in size with greater than 50%  respiratory variability, suggesting right atrial pressure of 3 mmHg.     Laboratory Data:  High Sensitivity Troponin:   Recent Labs  Lab 09/22/22 0500  TROPONINIHS 11     Chemistry Recent Labs  Lab 09/22/22 0500  NA 136  K 3.3*  CL 98  CO2 25  GLUCOSE 311*  BUN 17  CREATININE 0.90  CALCIUM 9.3  GFRNONAA >60  ANIONGAP 13    No results for input(s):  "PROT", "ALBUMIN", "AST", "ALT", "ALKPHOS", "BILITOT" in the last 168 hours. Lipids No results for input(s): "CHOL", "TRIG", "HDL", "LABVLDL", "LDLCALC", "CHOLHDL" in the last 168 hours.  Hematology Recent Labs  Lab 09/22/22 0500  WBC 6.5  RBC 4.39  HGB 13.8  HCT 41.2  MCV 93.8  MCH 31.4  MCHC 33.5  RDW 12.0  PLT 153   Thyroid No results for input(s): "TSH", "FREET4" in the last 168 hours.  BNPNo results for input(s): "BNP", "PROBNP" in the last 168 hours.  DDimer No results for input(s): "DDIMER" in the last 168 hours.   Radiology/Studies:  DG Chest Port 1 View  Result Date: 09/22/2022 CLINICAL DATA:  Chest pain and palpitations. EXAM: PORTABLE CHEST 1 VIEW COMPARISON:  08/11/2022 FINDINGS: Stable cardiomediastinal contours. Aortic atherosclerotic calcifications. Decreased lung volumes. No pleural fluid, interstitial edema or airspace disease. Osseous structures appear grossly intact. IMPRESSION: Low lung volumes. No active disease. Electronically Signed   By: Signa Kell M.D.   On: 09/22/2022 05:44     Assessment and Plan:   1. Atrial fibrillation with RVR with known hx of PAF, remote bradycardia with diltiazem - back in AF RVR this admission, previously NSR in 07/2022 but given embolic stroke there would be some suspicion that he had had PAF prior to admission - BP too low to add additional traditional metoprolol or diltiazem at this time (received 10mg  IV metoprolol in ED and failed DCCV x 2) - Dr. Clifton James advised IV amiodarone which is being started, we will follow - would hold oral metoprolol today given BP issues and IV dose given earlier - recommend to continue anticoagulation on admission, would suggest oral apixaban if OK with primary team, heparin per pharmacy if they anticipate any other procedures needed - check limited echo when HR <110bpm - f/u TSH ordered  2. Hypotension - etiology not entirely clear, some generalized lethargy the last few days so may need SIRS  type w/u - also appears to have had substantial weight loss - prev weight 240s in 2021, down to 166 - TSH pending - does not appear overtly volume overloaded or shocky otherwise, recent EF 07/2022 normal - we'll give additional IVF 100cc/hr x 10 hours - see MD commentary   3. CAD s/p prior PCIs, chest pain - initial troponin negative and reassuring, CXR with stable cardiomediastinal contours, await follow-up troponin value, ?chest pain related to hypotension and tachycardia, now resolved - anticipate continuing ASA, rosuvastatin, ezetimibe (LFTs added to labs)  4. Recent stroke, suspected embolic in setting of medication nonadherence - intended to be on ASA 81mg  + DOAC per last neurology notes (switched to Eliquis in rehab for insurance purposes)    5. Hypothyroidism -  intended to be on levothyroxine per last med rec, elevated TSH in 07/2022 in setting of nonadherence - f/u TSH pending   6. Hypokalemia - K 3.3, will replete x1, recommend to follow - mag pending   7. DM - home regimen includes Tradjenta, metformin, Ozempic with hyperglycemia 311 here - further mgmt per primary team  8. Weight loss - prev weight 240s in 2021, down to 166   Risk Assessment/Risk Scores:          CHA2DS2-VASc Score = 6   This indicates a 9.7% annual risk of stroke. The patient's score is based upon: CHF History: 0 HTN History: 1 Diabetes History: 1 Stroke History: 2 Vascular Disease History: 1 Age Score: 1 Gender Score: 0       For questions or updates, please contact [facility] Please consult [email or web address] for contact info under    Signed, Laurann Montana, PA-C  09/22/2022 8:37 AM  I have personally seen and examined this patient. I agree with the assessment and plan as outlined above.  67 yo male with known CAD and persistent atrial fib on Eliquis with recent stroke and stay in inpatient rehab. Failure to thrive at home with poor po intake.  On presentation to  the ED was found to have atrial fib with RVR. Cardioversion attempted by ED staff but unsuccessful. He has been started on an amiodarone drip.  Labs reviewed by me EKG reviewed by me and shows atrial fib with RVR Exam: Ill appearing, flat affect. ZO:[id number] irreg Lungs: clear bilat   Ext: no LE edema  Plan: Patient with overall failure to thrive at home post stroke. Chest pain in setting of rapid atrial fib last night. I agree with continuing amiodarone loading IV for now. Continue Eliquis. Will hold beta blocker given hypotension. Not clear if there is another active issue driving his lethargy. We will follow along for cardiac issues. He will be admitted to the medicine service.   Verne Carrow, MD, [facility] 09/22/2022 9:02 AM

## 2022-09-22 NOTE — Progress Notes (Signed)
Low level troponin elevation reviewed with Dr. Clifton James, 11->26. Per our discussion will obtain a f/u value to trend. Will s/o to on call APP to review follow-up value.

## 2022-09-22 NOTE — ED Notes (Signed)
ED TO INPATIENT HANDOFF REPORT  ED Nurse Name and Phone #: Vernona Rieger  [phone]  S Name/Age/Gender Anthony Woods 67 y.o. male Room/Bed: 022C/022C  Code Status   Code Status: Prior  Home/SNF/Other Home Patient oriented to: self, place, time, and situation Is this baseline? Yes   Triage Complete: Triage complete  Chief Complaint Atrial fibrillation with RVR (HCC) [I48.91]  Triage Note Patient BIB [facility] from home with complaint of crushing chest pain & palpitations of heart rate140-220 80/66 initial BP.   18g L AC 324 ASA 1.5L NS  Patient A & O x 4 on arrival   Allergies Allergies  Allergen Reactions   Bee Venom Anaphylaxis    Other reaction(s): Unknown   Actos [Pioglitazone Hydrochloride] Swelling   Lipitor [Atorvastatin] Other (See Comments)    Patient cannot recall reaction   Lisinopril Other (See Comments)    cough    Level of Care/Admitting Diagnosis ED Disposition     ED Disposition  Admit   Condition  --   Comment  Hospital Area: [facility] [[id number]]  Level of Care: Progressive [102]  Admit to Progressive based on following criteria: CARDIOVASCULAR & THORACIC of moderate stability with acute coronary syndrome symptoms/low risk myocardial infarction/hypertensive urgency/arrhythmias/heart failure potentially compromising stability and stable post cardiovascular intervention patients.  May admit patient to [facility] or [facility] if equivalent level of care is available:: No  Covid Evaluation: Asymptomatic - no recent exposure (last 10 days) testing not required  Diagnosis: Atrial fibrillation with RVR [facility]) [[id number]]  Admitting Physician: Jonah Blue [[id number]]  Attending Physician: Jonah Blue [[id number]]  Certification:: I certify this patient will need inpatient services for at least 2 midnights  Estimated Length of Stay: 3          B Medical/Surgery History Past Medical History:  Diagnosis Date   Allergy to bee sting    Anxiety     Arthritis    "right knee" (05/12/2016)   CAD (coronary artery disease)    a. DES to LCx 04/2016. b. DES to prox Cx 08/2017. c. Stable cath 09/2018 with nonobstructive residual disease, normal EF.   Carotid artery disease (HCC)    Headache    "when his sugar goes too high or too low" (05/12/2016)   HTN (hypertension)    Hyperlipidemia    Hypothyroidism    Macular degeneration    Obesity    PAF (paroxysmal atrial fibrillation) (HCC)    Type II diabetes mellitus (HCC)    Past Surgical History:  Procedure Laterality Date   CARDIAC CATHETERIZATION     COLONOSCOPY     CORONARY ANGIOPLASTY WITH STENT PLACEMENT  05/12/2016   CORONARY STENT INTERVENTION N/A 05/12/2016   Procedure: Coronary Stent Intervention;  Surgeon: Corky Crafts, MD;  Location: [facility] INVASIVE CV LAB;  Service: Cardiovascular;  Laterality: N/A;   CORONARY STENT INTERVENTION N/A 08/31/2017   Procedure: CORONARY STENT INTERVENTION;  Surgeon: Corky Crafts, MD;  Location: [facility] INVASIVE CV LAB;  Service: Cardiovascular;  Laterality: N/A;   GANGLION CYST EXCISION Left 1980s   KNEE ARTHROSCOPY Right    Meniscus removal   LEFT HEART CATH AND CORONARY ANGIOGRAPHY N/A 05/12/2016   Procedure: Left Heart Cath and Coronary Angiography;  Surgeon: Corky Crafts, MD;  Location: [facility] INVASIVE CV LAB;  Service: Cardiovascular;  Laterality: N/A;   LEFT HEART CATH AND CORONARY ANGIOGRAPHY N/A 08/31/2017   Procedure: LEFT HEART CATH AND CORONARY ANGIOGRAPHY;  Surgeon: Corky Crafts, MD;  Location: [facility]  INVASIVE CV LAB;  Service: Cardiovascular;  Laterality: N/A;   LEFT HEART CATHETERIZATION WITH CORONARY ANGIOGRAM N/A 02/04/2012   Procedure: LEFT HEART CATHETERIZATION WITH CORONARY ANGIOGRAM;  Surgeon: Kathleene Hazel, MD;  Location: [facility] CATH LAB;  Service: Cardiovascular;  Laterality: N/A;   POLYPECTOMY     RIGHT/LEFT HEART CATH AND CORONARY ANGIOGRAPHY N/A 09/28/2018   Procedure: RIGHT/LEFT HEART CATH AND CORONARY  ANGIOGRAPHY;  Surgeon: Kathleene Hazel, MD;  Location: [facility] INVASIVE CV LAB;  Service: Cardiovascular;  Laterality: N/A;     A IV Location/Drains/Wounds Patient Lines/Drains/Airways Status     Active Line/Drains/Airways     Name Placement date Placement time Site Days   Peripheral IV 09/22/22 18 G Left Antecubital 09/22/22  0434  Antecubital  less than 1   Peripheral IV 09/22/22 18 G Right Antecubital 09/22/22  0520  Antecubital   less than 1            Intake/Output Last 24 hours  Intake/Output Summary (Last 24 hours) at 09/22/2022 0951 Last data filed at 09/22/2022 0901 Gross per 24 hour  Intake --  Output 300 ml  Net -300 ml    Labs/Imaging Results for orders placed or performed during the hospital encounter of 09/22/22 (from the past 48 hour(s))  Basic metabolic panel     Status: Abnormal   Collection Time: 09/22/22  5:00 AM  Result Value Ref Range   Sodium 136 135 - 145 mmol/L   Potassium 3.3 (L) 3.5 - 5.1 mmol/L   Chloride 98 98 - 111 mmol/L   CO2 25 22 - 32 mmol/L   Glucose, Bld 311 (H) 70 - 99 mg/dL    Comment: Glucose reference range applies only to samples taken after fasting for at least 8 hours.   BUN 17 8 - 23 mg/dL   Creatinine, Ser 1.61 0.61 - 1.24 mg/dL   Calcium 9.3 8.9 - 09.6 mg/dL   GFR, Estimated >04 >54 mL/min    Comment: (NOTE) Calculated using the CKD-EPI Creatinine Equation (2021)    Anion gap 13 5 - 15    Comment: Performed at [facility], [address], [place], [place] [address]  CBC     Status: None   Collection Time: 09/22/22  5:00 AM  Result Value Ref Range   WBC 6.5 4.0 - 10.5 K/uL   RBC 4.39 4.22 - 5.81 MIL/uL   Hemoglobin 13.8 13.0 - 17.0 g/dL   HCT 91.4 78.2 - 95.6 %   MCV 93.8 80.0 - 100.0 fL   MCH 31.4 26.0 - 34.0 pg   MCHC 33.5 30.0 - 36.0 g/dL   RDW 21.3 08.6 - 57.8 %   Platelets 153 150 - 400 K/uL   nRBC 0.0 0.0 - 0.2 %    Comment: Performed at [facility], [address] [place], [place] [address]   Troponin I (High Sensitivity)     Status: None   Collection Time: 09/22/22  5:00 AM  Result Value Ref Range   Troponin I (High Sensitivity) 11 <18 ng/L    Comment: (NOTE) Elevated high sensitivity troponin I (hsTnI) values and significant  changes across serial measurements may suggest ACS but many other  chronic and acute conditions are known to elevate hsTnI results.  Refer to the "Links" section for chest pain algorithms and additional  guidance. Performed at [facility], [address] [place], [place] [address]   Hepatic function panel     Status: Abnormal  Collection Time: 09/22/22  5:00 AM  Result Value Ref Range   Total Protein 6.0 (L) 6.5 - 8.1 g/dL   Albumin 2.9 (L) 3.5 - 5.0 g/dL   AST 69 (H) 15 - 41 U/L   ALT 84 (H) 0 - 44 U/L   Alkaline Phosphatase 147 (H) 38 - 126 U/L   Total Bilirubin 0.7 0.3 - 1.2 mg/dL   Bilirubin, Direct 0.2 0.0 - 0.2 mg/dL   Indirect Bilirubin 0.5 0.3 - 0.9 mg/dL    Comment: Performed at [facility], [address], [place], [place] [address]  Troponin I (High Sensitivity)     Status: Abnormal   Collection Time: 09/22/22  7:55 AM  Result Value Ref Range   Troponin I (High Sensitivity) 26 (H) <18 ng/L    Comment: (NOTE) Elevated high sensitivity troponin I (hsTnI) values and significant  changes across serial measurements may suggest ACS but many other  chronic and acute conditions are known to elevate hsTnI results.  Refer to the "Links" section for chest pain algorithms and additional  guidance. Performed at [facility], [address], [place], [place] [address]   Magnesium     Status: None   Collection Time: 09/22/22  7:55 AM  Result Value Ref Range   Magnesium 1.7 1.7 - 2.4 mg/dL    Comment: Performed at [facility], [address] [place], [place] [address]  TSH     Status: None   Collection Time: 09/22/22  7:55 AM  Result Value Ref Range   TSH 2.708 0.350 - 4.500 uIU/mL    Comment: Performed by a  3rd Generation assay with a functional sensitivity of <=0.01 uIU/mL. Performed at [facility], [address] [place], [place] [address]    DG Chest Port 1 View  Result Date: 09/22/2022 CLINICAL DATA:  Chest pain and palpitations. EXAM: PORTABLE CHEST 1 VIEW COMPARISON:  08/11/2022 FINDINGS: Stable cardiomediastinal contours. Aortic atherosclerotic calcifications. Decreased lung volumes. No pleural fluid, interstitial edema or airspace disease. Osseous structures appear grossly intact. IMPRESSION: Low lung volumes. No active disease. Electronically Signed   By: Signa Kell M.D.   On: 09/22/2022 05:44    Pending Labs Unresulted Labs (From admission, onward)     Start     Ordered   09/23/22 0500  Basic metabolic panel  Tomorrow morning,   R        09/22/22 0903   09/23/22 0500  Magnesium  Tomorrow morning,   R        09/22/22 0903   09/23/22 0500  CBC  Tomorrow morning,   R        09/22/22 0903            Vitals/Pain Today's Vitals   09/22/22 0837 09/22/22 0900 09/22/22 0902 09/22/22 0915  BP:  (!) 90/57  93/73  Pulse:  (!) 112  (!) 103  Resp:  13  15  Temp: 98 F (36.7 C)     TempSrc: Oral     SpO2:  100%  96%  Weight:      Height:      PainSc:   4      Isolation Precautions No active isolations  Medications Medications  amiodarone (NEXTERONE PREMIX) 360-4.14 MG/200ML-% (1.8 mg/mL) IV infusion (60 mg/hr Intravenous New Bag/Given 09/22/22 0821)  amiodarone (NEXTERONE PREMIX) 360-4.14 MG/200ML-% (1.8 mg/mL) IV infusion (has no administration in time range)  0.9 %  sodium chloride infusion ( Intravenous  New Bag/Given 09/22/22 0907)  magnesium sulfate IVPB 2 g 50 mL (2 g Intravenous New Bag/Given 09/22/22 0919)  etomidate (AMIDATE) injection 20 mg (6 mg Intravenous Given 09/22/22 0539)  amiodarone (NEXTERONE) 1.8 mg/mL load via infusion 150 mg (150 mg Intravenous Bolus from Bag 09/22/22 0811)  potassium chloride SA (KLOR-CON M) CR tablet 40 mEq (40 mEq Oral Given 09/22/22  0827)    Mobility walks     Focused Assessments Cardiac Assessment Handoff:  Cardiac Rhythm: Atrial fibrillation Lab Results  Component Value Date   CKTOTAL 199 08/12/2022   TROPONINI <0.03 09/09/2018   No results found for: "DDIMER" Does the Patient currently have chest pain? No    R Recommendations: See Admitting Provider Note  Report given to:   Additional Notes: Smoker. Nicotine patch to be ordered.

## 2022-09-22 NOTE — ED Notes (Signed)
Going for echo.

## 2022-09-22 NOTE — ED Notes (Signed)
Cardioversion consent signed by all parties.

## 2022-09-22 NOTE — ED Notes (Signed)
Cardioverting at this time 200J

## 2022-09-22 NOTE — ED Notes (Signed)
2nd cardioversion at this time with 200J

## 2022-09-23 DIAGNOSIS — I4891 Unspecified atrial fibrillation: Secondary | ICD-10-CM | POA: Diagnosis not present

## 2022-09-23 LAB — CBC
HCT: 32.2 % — ABNORMAL LOW (ref 39.0–52.0)
Hemoglobin: 11.3 g/dL — ABNORMAL LOW (ref 13.0–17.0)
MCH: 32.3 pg (ref 26.0–34.0)
MCHC: 35.1 g/dL (ref 30.0–36.0)
MCV: 92 fL (ref 80.0–100.0)
Platelets: 141 10*3/uL — ABNORMAL LOW (ref 150–400)
RBC: 3.5 MIL/uL — ABNORMAL LOW (ref 4.22–5.81)
RDW: 12.1 % (ref 11.5–15.5)
WBC: 3.9 10*3/uL — ABNORMAL LOW (ref 4.0–10.5)
nRBC: 0 % (ref 0.0–0.2)

## 2022-09-23 LAB — BASIC METABOLIC PANEL
Anion gap: 7 (ref 5–15)
BUN: 16 mg/dL (ref 8–23)
CO2: 24 mmol/L (ref 22–32)
Calcium: 8.7 mg/dL — ABNORMAL LOW (ref 8.9–10.3)
Chloride: 105 mmol/L (ref 98–111)
Creatinine, Ser: 0.74 mg/dL (ref 0.61–1.24)
GFR, Estimated: >60 mL/min (ref >60)
Glucose, Bld: 141 mg/dL — ABNORMAL HIGH (ref 70–99)
Potassium: 3.2 mmol/L — ABNORMAL LOW (ref 3.5–5.1)
Sodium: 136 mmol/L (ref 135–145)

## 2022-09-23 LAB — MAGNESIUM: Magnesium: 1.8 mg/dL (ref 1.7–2.4)

## 2022-09-23 LAB — GLUCOSE, CAPILLARY
Glucose-Capillary: 157 mg/dL — ABNORMAL HIGH (ref 70–99)
Glucose-Capillary: 264 mg/dL — ABNORMAL HIGH (ref 70–99)
Glucose-Capillary: 181 mg/dL — ABNORMAL HIGH (ref 70–99)
Glucose-Capillary: 191 mg/dL — ABNORMAL HIGH (ref 70–99)

## 2022-09-23 MED ORDER — MAGNESIUM SULFATE 2 GM/50ML IV SOLN
2.0000 g | Freq: Once | INTRAVENOUS | Status: AC
  Administered 2022-09-23: 2 g via INTRAVENOUS
  Filled 2022-09-23: qty 50

## 2022-09-23 MED ORDER — POTASSIUM CHLORIDE CRYS ER 20 MEQ PO TBCR
40.0000 meq | EXTENDED_RELEASE_TABLET | ORAL | Status: AC
  Administered 2022-09-23 (×2): 40 meq via ORAL
  Filled 2022-09-23 (×2): qty 2

## 2022-09-23 NOTE — Evaluation (Signed)
Occupational Therapy Evaluation Patient Details Name: Anthony Woods MRN: [id number] DOB: 09/19/1955 Today's Date: 09/23/2022   History of Present Illness 67 yo male who presented to the ED on 09/22/22 with chest pain and weakness. Found to be in Afib with RVR, 2 attempted cardioversions were unsuccessful. Admitted for further care and w/u. PMH anxiety, OA, CAD, HTN, hypothyroidism, macular degeneration, obesity, PAF, DM, cardiac cath, coronary stent, knee scope   Clinical Impression   Patient admitted for the diagnosis above.  PTA he lives at home with his spouse, who assists as needed post CVA.  Patient did return home after AIR rehab stay, and per the spouse, is very close to his baseline with regards to toileting and ADL completion.  OT will defer to [facility] OT, as the patient was undergoing [facility] rehab post CVA and AIR stay.  Mobility specialist can be considered for increased mobility here in the acute setting.        Recommendations for follow up therapy are one component of a multi-disciplinary discharge planning process, led by the attending physician.  Recommendations may be updated based on patient status, additional functional criteria and insurance authorization.   Assistance Recommended at Discharge Intermittent Supervision/Assistance  Patient can return home with the following Assist for transportation;Assistance with cooking/housework;A little help with bathing/dressing/bathroom;A little help with walking and/or transfers    Functional Status Assessment  Patient has not had a recent decline in their functional status  Equipment Recommendations  None recommended by OT    Recommendations for Other Services       Precautions / Restrictions Precautions Precautions: Fall Precaution Comments: watch BP, R sided reduced coordination and weakness after CVA earlier this year Restrictions Weight Bearing Restrictions: No      Mobility Bed Mobility Overal bed mobility: Needs  Assistance Bed Mobility: Sit to Supine       Sit to supine: Supervision        Transfers Overall transfer level: Needs assistance   Transfers: Sit to/from Stand, Bed to chair/wheelchair/BSC Sit to Stand: Supervision     Step pivot transfers: Supervision            Balance Overall balance assessment: Needs assistance Sitting-balance support: Feet supported, Bilateral upper extremity supported Sitting balance-Leahy Scale: Good     Standing balance support: Reliant on assistive device for balance Standing balance-Leahy Scale: Poor Standing balance comment: reliant on RW                           ADL either performed or assessed with clinical judgement   ADL Overall ADL's : At baseline                                       General ADL Comments: spouse assists as needed, spouse stating he is at his baseline     Vision Patient Visual Report: No change from baseline Vision Assessment?: No apparent visual deficits     Perception     Praxis      Pertinent Vitals/Pain Pain Assessment Pain Assessment: No/denies pain     Hand Dominance Right   Extremity/Trunk Assessment Upper Extremity Assessment Upper Extremity Assessment: Generalized weakness   Lower Extremity Assessment Lower Extremity Assessment: Defer to PT evaluation   Cervical / Trunk Assessment Cervical / Trunk Assessment: Kyphotic   Communication Communication Communication: No difficulties   Cognition Arousal/Alertness: Awake/alert Behavior  During Therapy: WFL for tasks assessed/performed Overall Cognitive Status: Within Functional Limits for tasks assessed                                                        Home Living Family/patient expects to be discharged to:: Private residence Living Arrangements: Spouse/significant other Available Help at Discharge: Available 24 hours/day Type of Home: Mobile home Home Access: Stairs to  enter Entrance Stairs-Number of Steps: 4 Entrance Stairs-Rails: Right;Left Home Layout: One level     Bathroom Shower/Tub: Producer, television/film/video: Standard Bathroom Accessibility: Yes How Accessible: Accessible via walker Home Equipment: Rolling Walker (2 wheels);Rollator (4 wheels);Cane - single point;Shower seat      Lives With: Spouse    Prior Functioning/Environment Prior Level of Function : Independent/Modified Independent;History of Falls (last six months)             Mobility Comments: had a lot of falls before going to rehab unit in the hospital,  had another about a week ago ADLs Comments: prior to last 3 weeks, pt was independent but now will only shower or dress when he feels like it        OT Problem List: Decreased strength;Decreased activity tolerance;Impaired balance (sitting and/or standing);Decreased safety awareness      OT Treatment/Interventions:      OT Goals(Current goals can be found in the care plan section) Acute Rehab OT Goals Patient Stated Goal: Return home OT Goal Formulation: With patient Time For Goal Achievement: 10/07/22 Potential to Achieve Goals: Good  OT Frequency:      Co-evaluation              AM-PAC OT "6 Clicks" Daily Activity     Outcome Measure Help from another person eating meals?: None Help from another person taking care of personal grooming?: A Little Help from another person toileting, which includes using toliet, bedpan, or urinal?: A Little Help from another person bathing (including washing, rinsing, drying)?: A Little Help from another person to put on and taking off regular upper body clothing?: A Little Help from another person to put on and taking off regular lower body clothing?: A Little 6 Click Score: 19   End of Session Equipment Utilized During Treatment: Gait belt;Rolling walker (2 wheels) Nurse Communication: Mobility status  Activity Tolerance: Patient tolerated treatment  well Patient left: in bed;with call bell/phone within reach;with bed alarm set  OT Visit Diagnosis: Unsteadiness on feet (R26.81);Muscle weakness (generalized) (M62.81)                Time: 1530-1550 OT Time Calculation (min): 20 min Charges:  OT General Charges $OT Visit: 1 Visit OT Evaluation $OT Eval Moderate Complexity: 1 Mod  09/23/2022  RP, OTR/L  Acute Rehabilitation Services  Office:  [phone]   Suzanna Obey 09/23/2022, 3:55 PM

## 2022-09-23 NOTE — Plan of Care (Signed)

## 2022-09-23 NOTE — Progress Notes (Signed)
Progress Note  Patient Name: Anthony Woods Date of Encounter: 09/23/2022  Primary Cardiologist: Verne Carrow, MD   Subjective   Feels tired, weak. Not feeling any palpitations.  He doesn't recall why he came to the hospital. He has memory deficits since his stroke. He does recall that he has had palpitations in the past.    Inpatient Medications    Scheduled Meds:  apixaban  5 mg Oral BID   ezetimibe  10 mg Oral Daily   insulin aspart  0-15 Units Subcutaneous TID WC   insulin aspart  0-5 Units Subcutaneous QHS   levothyroxine  175 mcg Oral Q0600   melatonin  3 mg Oral QHS   nicotine  21 mg Transdermal Daily   QUEtiapine  50 mg Oral QHS   rosuvastatin  20 mg Oral Daily   sodium chloride flush  3 mL Intravenous Q12H   traZODone  75 mg Oral QHS   Continuous Infusions:  amiodarone 30 mg/hr (09/23/22 0822)   PRN Meds: acetaminophen **OR** acetaminophen, hydrALAZINE, ondansetron **OR** ondansetron (ZOFRAN) IV   Vital Signs    Vitals:   09/22/22 1953 09/23/22 0037 09/23/22 0435 09/23/22 0718  BP: 125/67 (!) 144/71 130/74 (!) 155/73  Pulse: 66 69 (!) 59 66  Resp: 18 18 18 18   Temp: 97.6 F (36.4 C) 97.6 F (36.4 C) (!) 97.5 F (36.4 C) 98.4 F (36.9 C)  TempSrc: Oral Oral Oral Oral  SpO2: 96% 97% 92% 96%  Weight:      Height:        Intake/Output Summary (Last 24 hours) at 09/23/2022 0824 Last data filed at 09/23/2022 1610 Gross per 24 hour  Intake 1137.94 ml  Output 500 ml  Net 637.94 ml   Filed Weights   09/22/22 0439  Weight: 75.3 kg    Telemetry    Now sinus rhythm - Personally Reviewed  ECG    Today -  sinus rhythm Yesterday - AF with RVR    - Personally Reviewed  Physical Exam  GEN: No acute distress.   Neck: No JVD Cardiac: RRR, no murmurs, rubs, or gallops.  Respiratory: Clear to auscultation bilaterally. GI: Soft, nontender, non-distended  MS: No edema; No deformity. Neuro:  Nonfocal  Psych: Normal affect   Labs     Chemistry Recent Labs  Lab 09/22/22 0500 09/23/22 0110  NA 136 136  K 3.3* 3.2*  CL 98 105  CO2 25 24  GLUCOSE 311* 141*  BUN 17 16  CREATININE 0.90 0.74  CALCIUM 9.3 8.7*  PROT 6.0*  --   ALBUMIN 2.9*  --   AST 69*  --   ALT 84*  --   ALKPHOS 147*  --   BILITOT 0.7  --   GFRNONAA >60 >60  ANIONGAP 13 7     Hematology Recent Labs  Lab 09/22/22 0500 09/23/22 0110  WBC 6.5 3.9*  RBC 4.39 3.50*  HGB 13.8 11.3*  HCT 41.2 32.2*  MCV 93.8 92.0  MCH 31.4 32.3  MCHC 33.5 35.1  RDW 12.0 12.1  PLT 153 141*    Cardiac EnzymesNo results for input(s): "TROPONINI" in the last 168 hours. No results for input(s): "TROPIPOC" in the last 168 hours.   BNPNo results for input(s): "BNP", "PROBNP" in the last 168 hours.   DDimer No results for input(s): "DDIMER" in the last 168 hours.   Summary of Pertinent studies    TTE: 7/4: Ef 50-55%. Normal LA size 5/24  Cardiac cath:  Imaging:    Patient Profile     67 y.o. male with medical history significant of CAD s/p stent, HTN, HLD, afib, CVA (07/2022), and DM presenting with chest pain/palpitations, found to be in atrial fibrillation with RVR. Cardioversion failed in the OR.  Amiodarone was started   Assessment & Plan    Atrial fibrillation with RVR Likely cause of recent acute stroke Continue apixaban 5 Continue amiodarone -switch to PO after completion of IV load. 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  Amiodarone is less likely to cause TdP than other QT-prolonging medications  Recent stroke Continue eliquis  We will sign off. Please contact me  with questions or concerns.  For questions or updates, please contact [facility] Please consult [email or web address] for contact info under Cardiology/STEMI.      Signed, Maurice Small, MD 09/23/2022, 8:24 AM

## 2022-09-23 NOTE — Progress Notes (Signed)
   09/23/22 1143  Orthostatic Lying   BP- Lying 117/70  Pulse- Lying 85  Orthostatic Sitting  BP- Sitting 128/76  Pulse- Sitting 69  Orthostatic Standing at 0 minutes  BP- Standing at 0 minutes (!) 150/112  Pulse- Standing at 0 minutes 70   ORTHOSTATIC VS as above. Patient shares that he does feel a little bit dizzy and that he legs are feeling a little weak, 3 minute VS unable to be completed. Patient is now transferred back to bed. Will continue to monitor.

## 2022-09-23 NOTE — Plan of Care (Signed)

## 2022-09-23 NOTE — Progress Notes (Signed)
PROGRESS NOTE    Anthony Woods  ZHY:[id number] DOB: [birth year] DOA: 09/22/2022 PCP: Tresa Garter, MD   Brief Narrative:  Anthony Woods is a 67 y.o. male with medical history significant of CAD s/p stent, HTN, HLD, afib, CVA (07/2022), and DM presenting with chest pain/palpitations as well as lethargy and weakness.  Attempted cardioversion x 2 in the ED without success, moderately hypotensive, cardiology recommending amiodarone drip.  Hospitalist called for admission, cardiology called in consult.  Assessment & Plan:   Principal Problem:   Atrial fibrillation with RVR (HCC) Active Problems:   Hypothyroidism   Diabetes mellitus type 2 in obese   Essential hypertension   Coronary atherosclerosis   History of CVA (cerebrovascular accident)   Dyslipidemia   DNR (do not resuscitate)   Afib with RVR -Resistant to cardioversion x 2, converted to sinus while on amiodarone drip -Cardiology following, appreciate insight recommendations.  Suspect transition to p.o. amiodarone in the next 24 hours per their protocol -Echo -EF 50-55% with no regional wall motion abnormalities, left ventricular hypertrophy(moderate) -CHA2DS2-VASc Score is >2 continue home Eliquis.   HTN -Hypotensive and symptomatic in the ED -Blood pressure improving with rate control -Continue to hold home medications, resume as appropriate per cardiology   HLD -Continue Crestor, Zetia   DM, non-insulin-dependent, uncontrolled -Last A1c was >15.5 -Continue to hold home acarbose, linagliptin, metformin and Ozempic -Continue sliding scale insulin, hypoglycemic protocol suspect patient will transition to insulin during hospitalization   CAD -s/p stent -On Eliquis so will hold ASA   CVA -in 07/2022 -On Eliquis -Likely outside the window for concurrent low-dose aspirin or Plavix -Continue Zetia, reports statin allergy   Hypothyroidism -Continue Synthroid -TSH within normal limits   Mood disorder,  unspecified -Continue Seroquel, Trazodone, melatonin -PT/OT evaluations ongoing   Tobacco dependence -Encourage cessation.   -Patch ordered     DVT prophylaxis: apixaban (ELIQUIS) tablet 5 mg  Code Status: DNR Family Communication: Wife at bedside  Status is: Inpatient  Dispo: The patient is from: Home              Anticipated d/c is to: Likely home              Anticipated d/c date is: 40 to 72 hours              Patient currently not medically stable for discharge  Consultants:  Cardiology  Procedures:  Cardioversion x 2 in the ED  Antimicrobials:  None  Subjective: No acute issues or events overnight  Objective: Vitals:   09/22/22 1953 09/23/22 0037 09/23/22 0435 09/23/22 0718  BP: 125/67 (!) 144/71 130/74 (!) 155/73  Pulse: 66 69 (!) 59 66  Resp: 18 18 18 18   Temp: 97.6 F (36.4 C) 97.6 F (36.4 C) (!) 97.5 F (36.4 C) 98.4 F (36.9 C)  TempSrc: Oral Oral Oral Oral  SpO2: 96% 97% 92% 96%  Weight:      Height:        Intake/Output Summary (Last 24 hours) at 09/23/2022 0830 Last data filed at 09/23/2022 0658 Gross per 24 hour  Intake 1137.94 ml  Output 500 ml  Net 637.94 ml   Filed Weights   09/22/22 0439  Weight: 75.3 kg    Examination:  General:  Pleasantly resting in bed, No acute distress. HEENT:  Normocephalic atraumatic.  Sclerae nonicteric, noninjected.  Extraocular movements intact bilaterally. Neck:  Without mass or deformity.  Trachea is midline. Lungs:  Clear to auscultate bilaterally without rhonchi,  wheeze, or rales. Heart:  Regular rate and rhythm.  Without murmurs, rubs, or gallops. Abdomen:  Soft, nontender, nondistended.  Without guarding or rebound. Extremities: Without cyanosis, clubbing, edema, or obvious deformity. Skin:  Warm and dry, no erythema.  Data Reviewed: I have personally reviewed following labs and imaging studies  CBC: Recent Labs  Lab 09/22/22 0500 09/23/22 0110  WBC 6.5 3.9*  HGB 13.8 11.3*  HCT 41.2  32.2*  MCV 93.8 92.0  PLT 153 141*   Basic Metabolic Panel: Recent Labs  Lab 09/22/22 0500 09/22/22 0755 09/23/22 0110  NA 136  --  136  K 3.3*  --  3.2*  CL 98  --  105  CO2 25  --  24  GLUCOSE 311*  --  141*  BUN 17  --  16  CREATININE 0.90  --  0.74  CALCIUM 9.3  --  8.7*  MG  --  1.7 1.8   GFR: Estimated Creatinine Clearance: 86.1 mL/min (by C-G formula based on SCr of 0.74 mg/dL). Liver Function Tests: Recent Labs  Lab 09/22/22 0500  AST 69*  ALT 84*  ALKPHOS 147*  BILITOT 0.7  PROT 6.0*  ALBUMIN 2.9*   CBG: Recent Labs  Lab 09/22/22 1110 09/22/22 1624 09/22/22 2054 09/23/22 0721  GLUCAP 327* 272* 150* 157*   Thyroid Function Tests: Recent Labs    09/22/22 0755  TSH 2.708   No results found for this or any previous visit (from the past 240 hour(s)).   Radiology Studies: ECHOCARDIOGRAM LIMITED  Result Date: 09/22/2022    ECHOCARDIOGRAM LIMITED REPORT   Patient Name:   Anthony Woods Date of Exam: 09/22/2022 Medical Rec #:  [id number]      Height:       65.0 in Accession #:    [id number]     Weight:       166.0 lb Date of Birth:  07-Jan-1956      BSA:          1.828 m Patient Age:    67 years       BP:           97/65 mmHg Patient Gender: M              HR:           98 bpm. Exam Location:  Inpatient Procedure: Limited Echo and Limited Color Doppler Indications:     A fib  History:         Patient has prior history of Echocardiogram examinations, most                  recent 08/13/2022. Arrythmias:Atrial Fibrillation,                  Signs/Symptoms:Chest Pain; Risk Factors:Hypertension, Diabetes                  and Current Smoker.  Sonographer:     Melissa Morford RDCS (AE, PE) Referring Phys:  [id number] Laurann Montana Diagnosing Phys: Epifanio Lesches MD IMPRESSIONS  1. Technically difficult study, in Afib with RVR  2. Left ventricular ejection fraction, by estimation, is 50 to 55%. The left ventricle has low normal function. The left ventricle has no regional wall  motion abnormalities. There is moderate left ventricular hypertrophy.  3. Right ventricular systolic function is normal. The right ventricular size is normal. Tricuspid regurgitation signal is inadequate for assessing PA pressure.  4. The mitral valve is normal in structure.  Trivial mitral valve regurgitation.  5. The aortic valve was not well visualized. Aortic valve regurgitation is not visualized.  6. The inferior vena cava is normal in size with greater than 50% respiratory variability, suggesting right atrial pressure of 3 mmHg. Conclusion(s)/Recommendation(s): Moderate LVH on echo but low voltage on EKG, consider evaluation for cardiac amyloidosis. FINDINGS  Left Ventricle: Left ventricular ejection fraction, by estimation, is 50 to 55%. The left ventricle has low normal function. The left ventricle has no regional wall motion abnormalities. The left ventricular internal cavity size was normal in size. There is moderate left ventricular hypertrophy. Right Ventricle: The right ventricular size is normal. Right ventricular systolic function is normal. Tricuspid regurgitation signal is inadequate for assessing PA pressure. Pericardium: There is no evidence of pericardial effusion. Mitral Valve: The mitral valve is normal in structure. Trivial mitral valve regurgitation. Aortic Valve: The aortic valve was not well visualized. Aortic valve regurgitation is not visualized. Aorta: The aortic root is normal in size and structure. Venous: The inferior vena cava is normal in size with greater than 50% respiratory variability, suggesting right atrial pressure of 3 mmHg. LEFT VENTRICLE PLAX 2D LVIDd:         4.00 cm LVIDs:         3.10 cm LV PW:         1.20 cm LV IVS:        1.10 cm  LEFT ATRIUM         Index LA diam:    4.20 cm 2.30 cm/m   AORTA Ao Root diam: 3.40 cm Epifanio Lesches MD Electronically signed by Epifanio Lesches MD Signature Date/Time: 09/22/2022/11:41:44 AM    Final (Updated)    DG Chest Port 1  View  Result Date: 09/22/2022 CLINICAL DATA:  Chest pain and palpitations. EXAM: PORTABLE CHEST 1 VIEW COMPARISON:  08/11/2022 FINDINGS: Stable cardiomediastinal contours. Aortic atherosclerotic calcifications. Decreased lung volumes. No pleural fluid, interstitial edema or airspace disease. Osseous structures appear grossly intact. IMPRESSION: Low lung volumes. No active disease. Electronically Signed   By: Signa Kell M.D.   On: 09/22/2022 05:44        Scheduled Meds:  apixaban  5 mg Oral BID   ezetimibe  10 mg Oral Daily   insulin aspart  0-15 Units Subcutaneous TID WC   insulin aspart  0-5 Units Subcutaneous QHS   levothyroxine  175 mcg Oral Q0600   melatonin  3 mg Oral QHS   nicotine  21 mg Transdermal Daily   QUEtiapine  50 mg Oral QHS   rosuvastatin  20 mg Oral Daily   sodium chloride flush  3 mL Intravenous Q12H   traZODone  75 mg Oral QHS   Continuous Infusions:  amiodarone 30 mg/hr (09/23/22 0822)     LOS: 1 day   Time spent:  Azucena Fallen, DO [facility]  If 7PM-7AM, please contact night-coverage [email or web address]  09/23/2022, 8:30 AM

## 2022-09-23 NOTE — Evaluation (Signed)
Physical Therapy Evaluation Patient Details Name: Anthony Woods MRN: [id number] DOB: 10-May-1955 Today's Date: 09/23/2022  History of Present Illness  67 yo male who presented to the ED on 09/22/22 with chest pain and weakness. Found to be in Afib with RVR, 2 attempted cardioversions were unsuccessful. Admitted for further care and w/u. PMH anxiety, OA, CAD, HTN, hypothyroidism, macular degeneration, obesity, PAF, DM, cardiac cath, coronary stent, knee scope  Clinical Impression    Pt received in bed, pleasant and very cooperative with PT; slightly tangential but easily redirected and followed commands well today. Generally able to mobilize with only light physical assistance, does have limited functional activity tolerance and ongoing general weakness. Blood pressures did much better, all WNL in supine, sitting, and standing. Left up in recliner with all needs met, spouse present and RN aware of pt status. Will continue working towards his goal of being able to go home safely, definitely recommend post-acute rehab at [facility].         Assistance Recommended at Discharge Frequent or constant Supervision/Assistance  If plan is discharge home, recommend the following:  Can travel by private vehicle  A little help with walking and/or transfers;Help with stairs or ramp for entrance;Assistance with cooking/housework;A little help with bathing/dressing/bathroom;Assist for transportation;Direct supervision/assist for financial management        Equipment Recommendations None recommended by PT (well equipped after recent CIR stay)  Recommendations for Other Services       Functional Status Assessment Patient has had a recent decline in their functional status and demonstrates the ability to make significant improvements in function in a reasonable and predictable amount of time.     Precautions / Restrictions Precautions Precautions: Fall;Other (comment) Precaution Comments: watch BP, R sided reduced  coordination and weakness after CVA earlier this year Restrictions Weight Bearing Restrictions: No      Mobility  Bed Mobility Overal bed mobility: Needs Assistance Bed Mobility: Supine to Sit     Supine to sit: Supervision, HOB elevated     General bed mobility comments: extra time/increased effort    Transfers Overall transfer level: Needs assistance Equipment used: Rolling walker (2 wheels) Transfers: Sit to/from Stand Sit to Stand: Min guard           General transfer comment: Min guard for safety, extra time/increased effort noted    Ambulation/Gait Ambulation/Gait assistance: Min guard Gait Distance (Feet): 30 Feet Assistive device: Rolling walker (2 wheels) Gait Pattern/deviations: Step-through pattern, Decreased step length - right, Decreased step length - left, Trunk flexed Gait velocity: decreased     General Gait Details: slow but steady with RW, easily fatigued  Stairs            Wheelchair Mobility     Tilt Bed    Modified Rankin (Stroke Patients Only)       Balance Overall balance assessment: Needs assistance Sitting-balance support: Feet supported, Bilateral upper extremity supported Sitting balance-Leahy Scale: Good     Standing balance support: Bilateral upper extremity supported, Reliant on assistive device for balance Standing balance-Leahy Scale: Poor Standing balance comment: reliant on RW                             Pertinent Vitals/Pain Pain Assessment Pain Assessment: No/denies pain    Home Living Family/patient expects to be discharged to:: Private residence Living Arrangements: Spouse/significant other Available Help at Discharge: Available 24 hours/day Type of Home: Mobile home Home Access: Stairs to  enter Entrance Stairs-Rails: Right;Left Entrance Stairs-Number of Steps: 4   Home Layout: One level Home Equipment: Agricultural consultant (2 wheels);Rollator (4 wheels);Cane - single point;Shower seat (has  WC ordered)      Prior Function Prior Level of Function : Independent/Modified Independent;History of Falls (last six months)             Mobility Comments: had a lot of falls before going to rehab unit in the hospital,  had another about a week ago       Hand Dominance        Extremity/Trunk Assessment   Upper Extremity Assessment Upper Extremity Assessment: Defer to OT evaluation    Lower Extremity Assessment Lower Extremity Assessment: Generalized weakness    Cervical / Trunk Assessment Cervical / Trunk Assessment: Kyphotic  Communication   Communication: No difficulties  Cognition Arousal/Alertness: Awake/alert Behavior During Therapy: WFL for tasks assessed/performed Overall Cognitive Status: Within Functional Limits for tasks assessed                                          General Comments      Exercises     Assessment/Plan    PT Assessment Patient needs continued PT services  PT Problem List Decreased strength;Decreased balance;Decreased mobility;Decreased activity tolerance;Decreased coordination;Decreased safety awareness       PT Treatment Interventions DME instruction;Functional mobility training;Balance training;Patient/family education;Gait training;Therapeutic activities;Neuromuscular re-education;Stair training;Therapeutic exercise    PT Goals (Current goals can be found in the Care Plan section)  Acute Rehab PT Goals Patient Stated Goal: go home PT Goal Formulation: With patient/family Time For Goal Achievement: 10/07/22 Potential to Achieve Goals: Good    Frequency Min 1X/week     Co-evaluation               AM-PAC PT "6 Clicks" Mobility  Outcome Measure Help needed turning from your back to your side while in a flat bed without using bedrails?: A Little Help needed moving from lying on your back to sitting on the side of a flat bed without using bedrails?: A Little Help needed moving to and from a bed to  a chair (including a wheelchair)?: A Little Help needed standing up from a chair using your arms (e.g., wheelchair or bedside chair)?: A Little Help needed to walk in hospital room?: A Little Help needed climbing 3-5 steps with a railing? : A Lot 6 Click Score: 17    End of Session Equipment Utilized During Treatment: Gait belt Activity Tolerance: Patient tolerated treatment well Patient left: in chair;with call bell/phone within reach Nurse Communication: Mobility status PT Visit Diagnosis: Unsteadiness on feet (R26.81);Muscle weakness (generalized) (M62.81);Difficulty in walking, not elsewhere classified (R26.2)    Time: 8295-6213 PT Time Calculation (min) (ACUTE ONLY): 34 min   Charges:   PT Evaluation $PT Eval Moderate Complexity: 1 Mod PT Treatments $Gait Training: 8-22 mins PT General Charges $$ ACUTE PT VISIT: 1 Visit       Nedra Hai, PT, DPT 09/23/22 2:42 PM

## 2022-09-24 DIAGNOSIS — I4891 Unspecified atrial fibrillation: Secondary | ICD-10-CM | POA: Diagnosis not present

## 2022-09-24 LAB — BASIC METABOLIC PANEL
Anion gap: 10 (ref 5–15)
BUN: 9 mg/dL (ref 8–23)
CO2: 25 mmol/L (ref 22–32)
Calcium: 8.9 mg/dL (ref 8.9–10.3)
Chloride: 104 mmol/L (ref 98–111)
Creatinine, Ser: 0.68 mg/dL (ref 0.61–1.24)
GFR, Estimated: >60 mL/min (ref >60)
Glucose, Bld: 203 mg/dL — ABNORMAL HIGH (ref 70–99)
Potassium: 3.3 mmol/L — ABNORMAL LOW (ref 3.5–5.1)
Sodium: 139 mmol/L (ref 135–145)

## 2022-09-24 LAB — CBC
HCT: 34.3 % — ABNORMAL LOW (ref 39.0–52.0)
Hemoglobin: 11.8 g/dL — ABNORMAL LOW (ref 13.0–17.0)
MCH: 31.1 pg (ref 26.0–34.0)
MCHC: 34.4 g/dL (ref 30.0–36.0)
MCV: 90.3 fL (ref 80.0–100.0)
Platelets: 168 10*3/uL (ref 150–400)
RBC: 3.8 MIL/uL — ABNORMAL LOW (ref 4.22–5.81)
RDW: 12.2 % (ref 11.5–15.5)
WBC: 4.6 10*3/uL (ref 4.0–10.5)
nRBC: 0 % (ref 0.0–0.2)

## 2022-09-24 LAB — GLUCOSE, CAPILLARY
Glucose-Capillary: 209 mg/dL — ABNORMAL HIGH (ref 70–99)
Glucose-Capillary: 219 mg/dL — ABNORMAL HIGH (ref 70–99)
Glucose-Capillary: 202 mg/dL — ABNORMAL HIGH (ref 70–99)
Glucose-Capillary: 335 mg/dL — ABNORMAL HIGH (ref 70–99)

## 2022-09-24 MED ORDER — ALUM & MAG HYDROXIDE-SIMETH 200-200-20 MG/5ML PO SUSP
30.0000 mL | Freq: Four times a day (QID) | ORAL | Status: DC | PRN
  Administered 2022-09-24: 30 mL via ORAL
  Filled 2022-09-24: qty 30

## 2022-09-24 MED ORDER — AMIODARONE HCL 200 MG PO TABS
200.0000 mg | ORAL_TABLET | Freq: Two times a day (BID) | ORAL | Status: DC
  Administered 2022-09-24 – 2022-09-25 (×2): 200 mg via ORAL
  Filled 2022-09-24 (×2): qty 1

## 2022-09-24 MED ORDER — POTASSIUM CHLORIDE CRYS ER 20 MEQ PO TBCR
60.0000 meq | EXTENDED_RELEASE_TABLET | ORAL | Status: AC
  Administered 2022-09-24 (×2): 60 meq via ORAL
  Filled 2022-09-24 (×2): qty 3

## 2022-09-24 MED ORDER — AMIODARONE HCL 200 MG PO TABS: 200.0000 mg | ORAL_TABLET | Freq: Every day | ORAL | Status: DC

## 2022-09-24 NOTE — Plan of Care (Signed)

## 2022-09-24 NOTE — Progress Notes (Signed)
PROGRESS NOTE    Anthony Woods  ZOX:[id number] DOB: 1956/01/17 DOA: 09/22/2022 PCP: Tresa Garter, MD   Brief Narrative:  Anthony Woods is a 67 y.o. male with medical history significant of CAD s/p stent, HTN, HLD, afib, CVA (07/2022), and DM presenting with chest pain/palpitations as well as lethargy and weakness.  Attempted cardioversion x 2 in the ED without success, moderately hypotensive, cardiology recommending amiodarone drip.  Hospitalist called for admission, cardiology called in consult.  Assessment & Plan:   Principal Problem:   Atrial fibrillation with RVR (HCC) Active Problems:   Hypothyroidism   Diabetes mellitus type 2 in obese   Essential hypertension   Coronary atherosclerosis   History of CVA (cerebrovascular accident)   Dyslipidemia   DNR (do not resuscitate)  Afib with RVR -Resistant to cardioversion x 2, converted to sinus while on amiodarone drip -Cardiology following, appreciate insight recommendations.  Suspect transition to p.o. amiodarone in the next 24 hours pending their evaluation. -Echo -EF 50-55% with no regional wall motion abnormalities, left ventricular hypertrophy(moderate) -CHA2DS2-VASc Score is >2 continue home Eliquis.   HTN -Hypotensive and symptomatic in the ED -Blood pressure improving with rate control -Continue to hold home medications, resume as appropriate per cardiology   HLD -Continue Crestor, Zetia   DM, non-insulin-dependent, uncontrolled -Last A1c was >15.5 -Continue to hold home acarbose, linagliptin, metformin and Ozempic -Continue sliding scale insulin, hypoglycemic protocol improving with diet - hoping to avoid discharging on insulin   CAD -s/p stent -On Eliquis so will hold ASA   CVA -in 07/2022 -On Eliquis -Likely outside the window for concurrent low-dose aspirin or Plavix -Continue Zetia, reports statin allergy  Hypothyroidism -Continue Synthroid -TSH within normal limits   Mood disorder,  unspecified -Continue Seroquel, Trazodone, melatonin -PT/OT evaluations ongoing   Tobacco dependence -Encourage cessation.   -Nicotine Patch ordered    DVT prophylaxis: apixaban (ELIQUIS) tablet 5 mg  Code Status: DNR Family Communication: Wife at bedside  Status is: Inpatient  Dispo: The patient is from: Home              Anticipated d/c is to: Likely home              Anticipated d/c date is: 40 to 72 hours              Patient currently not medically stable for discharge  Consultants:  Cardiology  Procedures:  Cardioversion x 2 in the ED  Antimicrobials:  None  Subjective: No acute issues or events overnight  Objective: Vitals:   09/23/22 2000 09/23/22 2031 09/24/22 0400 09/24/22 0742  BP:  126/72 (!) 152/74 (!) 153/72  Pulse:  62 60 64  Resp:  18 18 20   Temp:  97.6 F (36.4 C) (!) 97.5 F (36.4 C) 98.6 F (37 C)  TempSrc:  Oral Oral Oral  SpO2: 91% 94%  92%  Weight:   76.2 kg   Height:        Intake/Output Summary (Last 24 hours) at 09/24/2022 0830 Last data filed at 09/24/2022 0422 Gross per 24 hour  Intake 361.07 ml  Output 2750 ml  Net -2388.93 ml    Filed Weights   09/22/22 0439 09/24/22 0400  Weight: 75.3 kg 76.2 kg    Examination:  General:  Pleasantly resting in bed, No acute distress. HEENT:  Normocephalic atraumatic.  Sclerae nonicteric, noninjected.  Extraocular movements intact bilaterally. Neck:  Without mass or deformity.  Trachea is midline. Lungs:  Clear to auscultate bilaterally  without rhonchi, wheeze, or rales. Heart:  Regular rate and rhythm.  Without murmurs, rubs, or gallops. Abdomen:  Soft, nontender, nondistended.  Without guarding or rebound. Extremities: Without cyanosis, clubbing, edema, or obvious deformity. Skin:  Warm and dry, no erythema.  Data Reviewed: I have personally reviewed following labs and imaging studies  CBC: Recent Labs  Lab 09/22/22 0500 09/23/22 0110 09/24/22 0720  WBC 6.5 3.9* 4.6  HGB 13.8  11.3* 11.8*  HCT 41.2 32.2* 34.3*  MCV 93.8 92.0 90.3  PLT 153 141* 168    Basic Metabolic Panel: Recent Labs  Lab 09/22/22 0500 09/22/22 0755 09/23/22 0110 09/24/22 0720  NA 136  --  136 139  K 3.3*  --  3.2* 3.3*  CL 98  --  105 104  CO2 25  --  24 25  GLUCOSE 311*  --  141* 203*  BUN 17  --  16 9  CREATININE 0.90  --  0.74 0.68  CALCIUM 9.3  --  8.7* 8.9  MG  --  1.7 1.8  --     GFR: Estimated Creatinine Clearance: 86.6 mL/min (by C-G formula based on SCr of 0.68 mg/dL). Liver Function Tests: Recent Labs  Lab 09/22/22 0500  AST 69*  ALT 84*  ALKPHOS 147*  BILITOT 0.7  PROT 6.0*  ALBUMIN 2.9*    CBG: Recent Labs  Lab 09/23/22 0721 09/23/22 1140 09/23/22 1602 09/23/22 2123 09/24/22 0743  GLUCAP 157* 264* 181* 191* 209*    Thyroid Function Tests: Recent Labs    09/22/22 0755  TSH 2.708    No results found for this or any previous visit (from the past 240 hour(s)).   Radiology Studies: ECHOCARDIOGRAM LIMITED  Result Date: 09/22/2022    ECHOCARDIOGRAM LIMITED REPORT   Patient Name:   Anthony Woods Date of Exam: 09/22/2022 Medical Rec #:  [id number]      Height:       65.0 in Accession #:    [id number]     Weight:       166.0 lb Date of Birth:  March 11, 1956      BSA:          1.828 m Patient Age:    67 years       BP:           97/65 mmHg Patient Gender: M              HR:           98 bpm. Exam Location:  Inpatient Procedure: Limited Echo and Limited Color Doppler Indications:     A fib  History:         Patient has prior history of Echocardiogram examinations, most                  recent 08/13/2022. Arrythmias:Atrial Fibrillation,                  Signs/Symptoms:Chest Pain; Risk Factors:Hypertension, Diabetes                  and Current Smoker.  Sonographer:     Anthony Woods RDCS (AE, PE) Referring Phys:  [id number] Anthony Woods Diagnosing Phys: Anthony Lesches MD IMPRESSIONS  1. Technically difficult study, in Afib with RVR  2. Left ventricular ejection  fraction, by estimation, is 50 to 55%. The left ventricle has low normal function. The left ventricle has no regional wall motion abnormalities. There is moderate left ventricular hypertrophy.  3.  Right ventricular systolic function is normal. The right ventricular size is normal. Tricuspid regurgitation signal is inadequate for assessing PA pressure.  4. The mitral valve is normal in structure. Trivial mitral valve regurgitation.  5. The aortic valve was not well visualized. Aortic valve regurgitation is not visualized.  6. The inferior vena cava is normal in size with greater than 50% respiratory variability, suggesting right atrial pressure of 3 mmHg. Conclusion(s)/Recommendation(s): Moderate LVH on echo but low voltage on EKG, consider evaluation for cardiac amyloidosis. FINDINGS  Left Ventricle: Left ventricular ejection fraction, by estimation, is 50 to 55%. The left ventricle has low normal function. The left ventricle has no regional wall motion abnormalities. The left ventricular internal cavity size was normal in size. There is moderate left ventricular hypertrophy. Right Ventricle: The right ventricular size is normal. Right ventricular systolic function is normal. Tricuspid regurgitation signal is inadequate for assessing PA pressure. Pericardium: There is no evidence of pericardial effusion. Mitral Valve: The mitral valve is normal in structure. Trivial mitral valve regurgitation. Aortic Valve: The aortic valve was not well visualized. Aortic valve regurgitation is not visualized. Aorta: The aortic root is normal in size and structure. Venous: The inferior vena cava is normal in size with greater than 50% respiratory variability, suggesting right atrial pressure of 3 mmHg. LEFT VENTRICLE PLAX 2D LVIDd:         4.00 cm LVIDs:         3.10 cm LV PW:         1.20 cm LV IVS:        1.10 cm  LEFT ATRIUM         Index LA diam:    4.20 cm 2.30 cm/m   AORTA Ao Root diam: 3.40 cm Anthony Lesches MD  Electronically signed by Anthony Lesches MD Signature Date/Time: 09/22/2022/11:41:44 AM    Final (Updated)         Scheduled Meds:  apixaban  5 mg Oral BID   ezetimibe  10 mg Oral Daily   insulin aspart  0-15 Units Subcutaneous TID WC   insulin aspart  0-5 Units Subcutaneous QHS   levothyroxine  175 mcg Oral Q0600   melatonin  3 mg Oral QHS   nicotine  21 mg Transdermal Daily   QUEtiapine  50 mg Oral QHS   rosuvastatin  20 mg Oral Daily   sodium chloride flush  3 mL Intravenous Q12H   traZODone  75 mg Oral QHS   Continuous Infusions:  amiodarone 30 mg/hr (09/23/22 2015)     LOS: 2 days   Time spent:  Azucena Fallen, DO [facility]  If 7PM-7AM, please contact night-coverage [email or web address]  09/24/2022, 8:30 AM

## 2022-09-24 NOTE — TOC Initial Note (Signed)
Transition of Care [facility]) - Initial/Assessment Note    Patient Details  Name: Anthony Woods MRN: [id number] Date of Birth: 13-Sep-1955  Transition of Care [facility]) CM/SW Contact:    Kermit Balo, RN Phone Number: 09/24/2022, 11:21 AM  Clinical Narrative:                 Patient is from home with his spouse. They are together most of the time.  Wife manages his medications at home. They deny any issues.  Wife can provide needed transportation and will transport home at d/c. Pt is already active with [facility] for home health services. MD please place orders for resumption of HH services.  TOC following.  Expected Discharge Plan: Home w Home Health Services Barriers to Discharge: Continued Medical Work up   Patient Goals and CMS Choice   CMS Medicare.gov Compare Post Acute Care list provided to:: Patient Choice offered to / list presented to : Patient, Spouse      Expected Discharge Plan and Services   Discharge Planning Services: CM Consult Post Acute Care Choice: Home Health Living arrangements for the past 2 months: Mobile Home                           HH Arranged: PT, OT [facility] Agency: [employer] [facility] Date [facility] Agency Contacted: 09/24/22   Representative spoke with at [facility] Agency: cheryl  Prior Living Arrangements/Services Living arrangements for the past 2 months: Mobile Home Lives with:: Spouse Patient language and need for interpreter reviewed:: Yes Do you feel safe going back to the place where you live?: Yes        Care giver support system in place?: Yes (comment) Current home services: DME (walker/ shower seat) Criminal Activity/Legal Involvement Pertinent to Current Situation/Hospitalization: No - Comment as needed  Activities of Daily Living Home Assistive Devices/Equipment: CBG Meter, Walker (specify type) ADL Screening (condition at time of admission) Patient's cognitive ability adequate to safely complete daily activities?: Yes Is the  patient deaf or have difficulty hearing?: No Does the patient have difficulty seeing, even when wearing glasses/contacts?: No Does the patient have difficulty concentrating, remembering, or making decisions?: Yes Patient able to express need for assistance with ADLs?: Yes Does the patient have difficulty dressing or bathing?: Yes Independently performs ADLs?: No Communication: Independent Dressing (OT): Independent Grooming: Independent Feeding: Independent Bathing: Independent Toileting: Independent In/Out Bed: Independent with device (comment) Is this a change from baseline?: Pre-admission baseline Walks in Home: Independent with device (comment) Is this a change from baseline?: Pre-admission baseline Does the patient have difficulty walking or climbing stairs?: Yes Weakness of Legs: Both Weakness of Arms/Hands: None  Permission Sought/Granted                  Emotional Assessment Appearance:: Appears stated age Attitude/Demeanor/Rapport:  (quiet) Affect (typically observed): Accepting Orientation: : Oriented to Self, Oriented to Place, Oriented to  Time, Oriented to Situation   Psych Involvement: No (comment)  Admission diagnosis:  Atrial fibrillation with RVR (HCC) [I48.91] Patient Active Problem List   Diagnosis Date Noted   Mild neurocognitive disorder due to another medical condition 08/25/2022   Acute embolic stroke (HCC) 08/13/2022   History of CVA (cerebrovascular accident) 08/12/2022   Dyslipidemia 08/12/2022   Noncompliance with medication regimen 08/12/2022   DNR (do not resuscitate) 08/12/2022   Knee strain, right, initial encounter 08/27/2020   Carotid bruit 05/22/2020   COPD (chronic obstructive pulmonary disease) (  HCC) 05/22/2020   Neoplasm of uncertain behavior of skin 01/10/2020   Right knee pain 05/01/2019   DOE (dyspnea on exertion)    Chest pain 09/08/2018   Wrist pain, acute, left 04/24/2018   Angina pectoris, unspecified (HCC)    Stress at  work 12/15/2016   Tobacco dependence 06/04/2016   Atrial fibrillation with RVR (HCC) 05/12/2016   Elevated troponin    Atrial fibrillation (HCC) 05/11/2016   Allergic rhinitis 08/12/2015   Chest pain with moderate risk of acute coronary syndrome 04/14/2015   Urinary frequency 04/14/2015   Osteoarthritis of right knee 12/09/2014   Snoring 09/25/2013   Rash and nonspecific skin eruption 08/08/2012   Insomnia 08/08/2012   Class 1 obesity due to excess calories with body mass index (BMI) of 34.0 to 34.9 in adult 06/10/2011   Well adult exam 06/10/2011   Cough due to angiotensin-converting enzyme inhibitor 10/08/2010   SHOULDER PAIN 03/09/2010   NECK PAIN 03/09/2010   BRONCHITIS, ACUTE 12/13/2007   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   Hypothyroidism 04/04/2007   Diabetes mellitus type 2 in obese 04/04/2007   Essential hypertension 04/04/2007   Coronary atherosclerosis 04/04/2007   Edema 04/04/2007   PCP:  Tresa Garter, MD Pharmacy:   [facility] - [place], [place] - [address] [place] [address] Phone: [phone] Fax: [phone]  [id number] - [address], [place] - [address] [address] Phone: [phone] Fax: [phone]  [facility] [address] [place] [address] Phone: [phone] Fax: [phone]     Social Determinants of Health (SDOH) Social History: SDOH Screenings   Food Insecurity: No Food Insecurity (09/22/2022)  Housing: Low Risk  (09/22/2022)  Transportation Needs: No Transportation Needs (09/22/2022)  Utilities: Not At Risk (09/22/2022)  Depression (PHQ2-9): Low Risk  (10/04/2019)  Tobacco Use: High Risk (09/22/2022)   SDOH Interventions:     Readmission Risk Interventions     No data to display

## 2022-09-24 NOTE — Inpatient Diabetes Management (Signed)
Inpatient Diabetes Program Recommendations  AACE/ADA: New Consensus Statement on Inpatient Glycemic Control (2015)  Target Ranges:  Prepandial:   less than 140 mg/dL      Peak postprandial:   less than 180 mg/dL (1-2 hours)      Critically ill patients:  140 - 180 mg/dL   Lab Results  Component Value Date   GLUCAP 219 (H) 09/24/2022   HGBA1C >15.5 (H) 08/11/2022    Review of Glycemic Control  Latest Reference Range & Units 09/23/22 11:40 09/23/22 16:02 09/23/22 21:23 09/24/22 07:43 09/24/22 12:02  Glucose-Capillary 70 - 99 mg/dL 409 (H) 811 (H) 914 (H) 209 (H) 219 (H)  Diabetes history: DM 2 Outpatient Diabetes medications: Tradjenta 5 mg Daily, Metformin 1000 mg bid Current orders for Inpatient glycemic control:  Novolog 0-15 units tid + hs insulin just started near lunchtime today Inpatient Diabetes Program Recommendations:    Consider adding Semglee 12 units daily while in the hospital.  Note that A1C was markedly increased during last hospitalization.  Needs close monitoring at discharge and possibly insulin?    Thanks,  Beryl Meager, RN, BC-ADM Inpatient Diabetes Coordinator Pager [phone]  (8a-5p)

## 2022-09-24 NOTE — Progress Notes (Signed)
Physical Therapy Treatment Patient Details Name: Anthony Woods MRN: [id number] DOB: Mar 09, 1956 Today's Date: 09/24/2022   History of Present Illness 67 yo male who presented to the ED on 09/22/22 with chest pain and weakness. Found to be in Afib with RVR, 2 attempted cardioversions were unsuccessful. Admitted for further care and w/u. PMH anxiety, OA, CAD, HTN, hypothyroidism, macular degeneration, obesity, PAF, DM, cardiac cath, coronary stent, knee scope    PT Comments  Pt in bathroom attempting BM but reports only flatulence.  He required assistance to mobilize intermittently as he remains unsteady due to weakness from CVA.  When fatigued he required cues to clear B feet with step through pattern.  Will recommend Rollator for home use in aid with energy conservation for community ambulation.      Assistance Recommended at Discharge Frequent or constant Supervision/Assistance  If plan is discharge home, recommend the following:  Can travel by private vehicle    A little help with walking and/or transfers;Help with stairs or ramp for entrance;Assistance with cooking/housework;A little help with bathing/dressing/bathroom;Assist for transportation;Direct supervision/assist for financial management      Equipment Recommendations  Rollator (4 wheels) (for energy conservation)    Recommendations for Other Services       Precautions / Restrictions Precautions Precautions: Fall Precaution Comments: watch BP, R sided reduced coordination and weakness after CVA earlier this year Restrictions Weight Bearing Restrictions: No     Mobility  Bed Mobility               General bed mobility comments: in bathroom pre tx and seated edge of bed post tx    Transfers Overall transfer level: Needs assistance Equipment used: Rolling walker (2 wheels) Transfers: Sit to/from Stand Sit to Stand: Supervision           General transfer comment: Cues for hand placement to and from seated  surface.    Ambulation/Gait Ambulation/Gait assistance: Min guard Gait Distance (Feet): 160 Feet Assistive device: Rolling walker (2 wheels) Gait Pattern/deviations: Step-through pattern, Decreased step length - right, Decreased step length - left, Trunk flexed Gait velocity: decreased     General Gait Details: No respiratory fatigue noted, HR well controlled. When LE fatigued he had difficulty with foot clearance.  Presents with minor LOB x 2 but able to self correct.  Happens when impulsive movements are made.  Narrow BOS noted with toes turned in.   Stairs Stairs: Yes Stairs assistance: Supervision Stair Management: Step to pattern, Two rails Number of Stairs: 4 General stair comments: Cues for sequencing and safety.  No LOB noted.   Wheelchair Mobility     Tilt Bed    Modified Rankin (Stroke Patients Only)       Balance Overall balance assessment: Needs assistance Sitting-balance support: Feet supported, Bilateral upper extremity supported Sitting balance-Leahy Scale: Good Sitting balance - Comments: posterior and right LOB in sitting     Standing balance-Leahy Scale: Poor Standing balance comment: reliant on RW                            Cognition Arousal/Alertness: Awake/alert Behavior During Therapy: WFL for tasks assessed/performed Overall Cognitive Status: Within Functional Limits for tasks assessed                                          Exercises  General Comments        Pertinent Vitals/Pain Pain Assessment Pain Assessment: No/denies pain    Home Living                          Prior Function            PT Goals (current goals can now be found in the care plan section) Acute Rehab PT Goals Patient Stated Goal: go home Potential to Achieve Goals: Good Progress towards PT goals: Progressing toward goals    Frequency    Min 1X/week      PT Plan Current plan remains appropriate     Co-evaluation              AM-PAC PT "6 Clicks" Mobility   Outcome Measure  Help needed turning from your back to your side while in a flat bed without using bedrails?: A Little Help needed moving from lying on your back to sitting on the side of a flat bed without using bedrails?: A Little Help needed moving to and from a bed to a chair (including a wheelchair)?: A Little Help needed standing up from a chair using your arms (e.g., wheelchair or bedside chair)?: A Little Help needed to walk in hospital room?: A Little Help needed climbing 3-5 steps with a railing? : A Little 6 Click Score: 18    End of Session Equipment Utilized During Treatment: Gait belt Activity Tolerance: Patient tolerated treatment well Patient left: with call bell/phone within reach;in bed;with family/visitor present (seated edge of bed.) Nurse Communication: Mobility status PT Visit Diagnosis: Unsteadiness on feet (R26.81);Muscle weakness (generalized) (M62.81);Difficulty in walking, not elsewhere classified (R26.2)     Time: 9147-8295 PT Time Calculation (min) (ACUTE ONLY): 17 min  Charges:    $Gait Training: 8-22 mins PT General Charges $$ ACUTE PT VISIT: 1 Visit                     Anthony Woods , PTA Acute Rehabilitation Services Office [phone]    Anthony Woods Artis Delay 09/24/2022, 1:40 PM

## 2022-09-25 DIAGNOSIS — I4891 Unspecified atrial fibrillation: Secondary | ICD-10-CM | POA: Diagnosis not present

## 2022-09-25 LAB — GLUCOSE, CAPILLARY: Glucose-Capillary: 219 mg/dL — ABNORMAL HIGH (ref 70–99)

## 2022-09-25 MED ORDER — AMIODARONE HCL 200 MG PO TABS: 200.0000 mg | ORAL_TABLET | Freq: Two times a day (BID) | ORAL | 0 refills | Status: DC

## 2022-09-25 MED ORDER — AMIODARONE HCL 200 MG PO TABS: 200.0000 mg | ORAL_TABLET | Freq: Every day | ORAL | 0 refills | Status: DC

## 2022-09-25 NOTE — Discharge Summary (Signed)
Physician Discharge Summary  Anthony Woods ZOX:[id number] DOB: Feb 22, 1956 DOA: 09/22/2022  PCP: Tresa Garter, MD  Admit date: 09/22/2022 Discharge date: 09/25/2022  Admitted From: Home Disposition: Home  Recommendations for Outpatient Follow-up:  Follow up with PCP in 1-2 weeks Follow-up with cardiology in A-fib clinic as scheduled  Home Health: PT OT Equipment/Devices: None  Discharge Condition: Stable CODE STATUS: DNR Diet recommendation: Low-salt low-fat low-carb diet  Brief/Interim Summary: Anthony Woods is a 67 y.o. male with medical history significant of CAD s/p stent, HTN, HLD, afib, CVA (07/2022), and DM presenting with chest pain/palpitations as well as lethargy and weakness.  Attempted cardioversion x 2 in the ED without success, moderately hypotensive, cardiology recommending amiodarone drip.  Hospitalist called for admission, cardiology called in consult.   Patient admitted as above with acute symptomatic A-fib with RVR having failed cardioversion ultimately transition to amiodarone drip and converted to sinus rhythm.  Patient was transition from IV amiodarone to p.o. amiodarone per cardiology recommendations.  Follow-up outpatient with PCP and at atrial fibrillation clinic as scheduled.  Otherwise given improvement in patient's heart rate and symptoms he is otherwise stable for discharge home.  Discharge Diagnoses:  Principal Problem:   Atrial fibrillation with RVR (HCC) Active Problems:   Hypothyroidism   Diabetes mellitus type 2 in obese   Essential hypertension   Coronary atherosclerosis   History of CVA (cerebrovascular accident)   Dyslipidemia   DNR (do not resuscitate)    Discharge Instructions  Discharge Instructions     Amb referral to AFIB Clinic   Complete by: As directed       Allergies as of 09/25/2022       Reactions   Bee Venom Anaphylaxis   Other reaction(s): Unknown   Actos [pioglitazone Hydrochloride] Swelling   Lipitor  [atorvastatin] Other (See Comments)   Patient cannot recall reaction   Lisinopril Other (See Comments)   cough        Medication List     TAKE these medications    acarbose 50 MG tablet Commonly known as: PRECOSE Take 1 tablet (50 mg total) by mouth 3 (three) times daily with meals.   acetaminophen 325 MG tablet Commonly known as: TYLENOL Take 1-2 tablets (325-650 mg total) by mouth every 6 (six) hours as needed for moderate pain or mild pain.   amiodarone 200 MG tablet Commonly known as: PACERONE Take 1 tablet (200 mg total) by mouth 2 (two) times daily for 6 days.   amiodarone 200 MG tablet Commonly known as: PACERONE Take 1 tablet (200 mg total) by mouth daily. Start taking on: October 02, 2022   aspirin EC 81 MG tablet Take 1 tablet (81 mg total) by mouth daily for 30 days then as directed by MD .Swallow whole.   B-12 500 MCG Tabs TAKE 1 TABLET (500 MCG TOTAL) BY MOUTH DAILY. What changed: See the new instructions.   diphenhydrAMINE 25 mg capsule Commonly known as: BENADRYL Take 25 mg by mouth as needed (bee stings).   Eliquis 5 MG Tabs tablet Generic drug: apixaban Take 1 tablet (5 mg total) by mouth 2 (two) times daily.   EPINEPHrine 0.3 mg/0.3 mL Soaj injection Commonly known as: EpiPen 2-Pak Inject 0.3 mg into the muscle as needed for anaphylaxis.   ezetimibe 10 MG tablet Commonly known as: ZETIA Take 1 tablet (10 mg total) by mouth daily.   FreeStyle Libre 3 Reader [place] 1 each by Does not apply route daily.   FreeStyle [place] 3  Sensor Misc 1 each by Does not apply route daily. Place 1 sensor on the skin every 14 days. Use to check glucose continuously   levothyroxine 175 MCG tablet Commonly known as: SYNTHROID Take 1 tablet (175 mcg total) by mouth daily at 6 (six) AM.   magnesium oxide 400 (240 Mg) MG tablet Commonly known as: MAG-OX Take 1 tablet (400 mg total) by mouth daily at 2 PM for 30 days then as directed by MD   melatonin 3 MG Tabs  tablet Take 1 tablet (3 mg total) by mouth at bedtime.   metFORMIN 1000 MG tablet Commonly known as: GLUCOPHAGE Take 1 tablet (1,000 mg total) by mouth 2 (two) times daily with a meal.   metoprolol succinate 25 MG 24 hr tablet Commonly known as: TOPROL-XL Take 0.5 tablets (12.5 mg total) by mouth daily.   nicotine 21 mg/24hr patch Commonly known as: NICODERM CQ - dosed in mg/24 hours Place 1 patch (21 mg total) onto the skin daily.   nitroGLYCERIN 0.4 MG SL tablet Commonly known as: NITROSTAT Place 1 tablet (0.4 mg total) under the tongue every 5 (five) minutes as needed for chest pain.   Ozempic (0.25 or 0.5 MG/DOSE) 2 MG/1.5ML Sopn Generic drug: Semaglutide(0.25 or 0.5MG /DOS) Inject 0.5 mg into the skin once a week.   QUEtiapine 50 MG tablet Commonly known as: SEROQUEL Take 1 tablet (50 mg total) by mouth at bedtime.   rosuvastatin 20 MG tablet Commonly known as: CRESTOR Take 1 tablet (20 mg total) by mouth daily.   Tradjenta 5 MG Tabs tablet Generic drug: linagliptin Take 1 tablet (5 mg total) by mouth daily.   traZODone 150 MG tablet Commonly known as: DESYREL Take 0.5 tablets (75 mg total) by mouth at bedtime.        Follow-up Information     [facility] home health Follow up.   Why: the home health will contact you for the next home visit. Contact information: [phone]               Allergies  Allergen Reactions   Bee Venom Anaphylaxis    Other reaction(s): Unknown   Actos [Pioglitazone Hydrochloride] Swelling   Lipitor [Atorvastatin] Other (See Comments)    Patient cannot recall reaction   Lisinopril Other (See Comments)    cough    Consultations: Cardiology  Procedures/Studies: ECHOCARDIOGRAM LIMITED  Result Date: 09/22/2022    ECHOCARDIOGRAM LIMITED REPORT   Patient Name:   Anthony Woods Date of Exam: 09/22/2022 Medical Rec #:  [id number]      Height:       65.0 in Accession #:    [id number]     Weight:       166.0 lb Date of Birth:   04-04-1955      BSA:          1.828 m Patient Age:    67 years       BP:           97/65 mmHg Patient Gender: M              HR:           98 bpm. Exam Location:  Inpatient Procedure: Limited Echo and Limited Color Doppler Indications:     A fib  History:         Patient has prior history of Echocardiogram examinations, most                  recent 08/13/2022.  Arrythmias:Atrial Fibrillation,                  Signs/Symptoms:Chest Pain; Risk Factors:Hypertension, Diabetes                  and Current Smoker.  Sonographer:     Melissa Morford RDCS (AE, PE) Referring Phys:  [id number] Laurann [place] Diagnosing Phys: Epifanio Lesches MD IMPRESSIONS  1. Technically difficult study, in Afib with RVR  2. Left ventricular ejection fraction, by estimation, is 50 to 55%. The left ventricle has low normal function. The left ventricle has no regional wall motion abnormalities. There is moderate left ventricular hypertrophy.  3. Right ventricular systolic function is normal. The right ventricular size is normal. Tricuspid regurgitation signal is inadequate for assessing PA pressure.  4. The mitral valve is normal in structure. Trivial mitral valve regurgitation.  5. The aortic valve was not well visualized. Aortic valve regurgitation is not visualized.  6. The inferior vena cava is normal in size with greater than 50% respiratory variability, suggesting right atrial pressure of 3 mmHg. Conclusion(s)/Recommendation(s): Moderate LVH on echo but low voltage on EKG, consider evaluation for cardiac amyloidosis. FINDINGS  Left Ventricle: Left ventricular ejection fraction, by estimation, is 50 to 55%. The left ventricle has low normal function. The left ventricle has no regional wall motion abnormalities. The left ventricular internal cavity size was normal in size. There is moderate left ventricular hypertrophy. Right Ventricle: The right ventricular size is normal. Right ventricular systolic function is normal. Tricuspid regurgitation  signal is inadequate for assessing PA pressure. Pericardium: There is no evidence of pericardial effusion. Mitral Valve: The mitral valve is normal in structure. Trivial mitral valve regurgitation. Aortic Valve: The aortic valve was not well visualized. Aortic valve regurgitation is not visualized. Aorta: The aortic root is normal in size and structure. Venous: The inferior vena cava is normal in size with greater than 50% respiratory variability, suggesting right atrial pressure of 3 mmHg. LEFT VENTRICLE PLAX 2D LVIDd:         4.00 cm LVIDs:         3.10 cm LV PW:         1.20 cm LV IVS:        1.10 cm  LEFT ATRIUM         Index LA diam:    4.20 cm 2.30 cm/m   AORTA Ao Root diam: 3.40 cm Epifanio Lesches MD Electronically signed by Epifanio Lesches MD Signature Date/Time: 09/22/2022/11:41:44 AM    Final (Updated)    DG Chest Port 1 View  Result Date: 09/22/2022 CLINICAL DATA:  Chest pain and palpitations. EXAM: PORTABLE CHEST 1 VIEW COMPARISON:  08/11/2022 FINDINGS: Stable cardiomediastinal contours. Aortic atherosclerotic calcifications. Decreased lung volumes. No pleural fluid, interstitial edema or airspace disease. Osseous structures appear grossly intact. IMPRESSION: Low lung volumes. No active disease. Electronically Signed   By: Signa Kell M.D.   On: 09/22/2022 05:44     Subjective: No acute issues or events overnight   Discharge Exam: Vitals:   09/25/22 0656 09/25/22 0738  BP:  (!) 156/74  Pulse: 61 62  Resp:  18  Temp:  97.8 F (36.6 C)  SpO2: 98% 99%   Vitals:   09/25/22 0320 09/25/22 0400 09/25/22 0656 09/25/22 0738  BP: (!) 138/97   (!) 156/74  Pulse: 60  61 62  Resp: 18   18  Temp: 97.8 F (36.6 C)   97.8 F (36.6 C)  TempSrc: Oral  Oral  SpO2: 99% 97% 98% 99%  Weight: 76.9 kg     Height:        General: Pt is alert, awake, not in acute distress Cardiovascular: RRR, S1/S2 +, no rubs, no gallops Respiratory: CTA bilaterally, no wheezing, no  rhonchi Abdominal: Soft, NT, ND, bowel sounds + Extremities: no edema, no cyanosis    The results of significant diagnostics from this hospitalization (including imaging, microbiology, ancillary and laboratory) are listed below for reference.     Microbiology: No results found for this or any previous visit (from the past 240 hour(s)).   Labs: BNP (last 3 results) No results for input(s): "BNP" in the last 8760 hours. Basic Metabolic Panel: Recent Labs  Lab 09/22/22 0500 09/22/22 0755 09/23/22 0110 09/24/22 0720  NA 136  --  136 139  K 3.3*  --  3.2* 3.3*  CL 98  --  105 104  CO2 25  --  24 25  GLUCOSE 311*  --  141* 203*  BUN 17  --  16 9  CREATININE 0.90  --  0.74 0.68  CALCIUM 9.3  --  8.7* 8.9  MG  --  1.7 1.8  --    Liver Function Tests: Recent Labs  Lab 09/22/22 0500  AST 69*  ALT 84*  ALKPHOS 147*  BILITOT 0.7  PROT 6.0*  ALBUMIN 2.9*   CBC: Recent Labs  Lab 09/22/22 0500 09/23/22 0110 09/24/22 0720  WBC 6.5 3.9* 4.6  HGB 13.8 11.3* 11.8*  HCT 41.2 32.2* 34.3*  MCV 93.8 92.0 90.3  PLT 153 141* 168   CBG: Recent Labs  Lab 09/24/22 0743 09/24/22 1202 09/24/22 1525 09/24/22 2017 09/25/22 0736  GLUCAP 209* 219* 202* 335* 219*   Urinalysis    Component Value Date/Time   COLORURINE YELLOW 08/19/2022 1351   APPEARANCEUR HAZY (A) 08/19/2022 1351   LABSPEC 1.018 08/19/2022 1351   PHURINE 5.0 08/19/2022 1351   GLUCOSEU >=500 (A) 08/19/2022 1351   GLUCOSEU 250 (A) 09/14/2017 0850   HGBUR SMALL (A) 08/19/2022 1351   BILIRUBINUR NEGATIVE 08/19/2022 1351   BILIRUBINUR neg 06/19/2014 0936   KETONESUR 5 (A) 08/19/2022 1351   PROTEINUR 100 (A) 08/19/2022 1351   UROBILINOGEN 0.2 09/14/2017 0850   NITRITE NEGATIVE 08/19/2022 1351   LEUKOCYTESUR NEGATIVE 08/19/2022 1351   Sepsis Labs Recent Labs  Lab 09/22/22 0500 09/23/22 0110 09/24/22 0720  WBC 6.5 3.9* 4.6   Microbiology No results found for this or any previous visit (from the past  240 hour(s)).   Time coordinating discharge: Over 30 minutes  SIGNED:   Azucena Fallen, DO [facility] 09/25/2022, 5:39 PM Pager   If 7PM-7AM, please contact night-coverage [email or web address]

## 2022-09-25 NOTE — TOC Transition Note (Addendum)
Transition of Care [facility]) - CM/SW Discharge Note   Patient Details  Name: Anthony Woods MRN: [id number] Date of Birth: [birth year]  Transition of Care [facility]) CM/SW Contact:  Ronny Bacon, RN Phone Number: 09/25/2022, 9:49 AM   Clinical Narrative:  Patient being discharged home today. Spoke with [facility], confirmed that patient is under their care for [facility] PT/OT and is aware that patient is being discharged today. [employer] contacted to deliver Rollator to bedside.    Final next level of care: Home w Home Health Services Barriers to Discharge: No Barriers Identified   Patient Goals and CMS Choice CMS Medicare.gov Compare Post Acute Care list provided to:: Patient Choice offered to / list presented to : Patient, Spouse  Discharge Placement                         Discharge Plan and Services Additional resources added to the After Visit Summary for     Discharge Planning Services: CM Consult Post Acute Care Choice: Home Health                    HH Arranged: PT, OT [facility] Agency: [facility] Date [facility] Agency Contacted: 09/24/22   Representative spoke with at [facility] Agency: cheryl  Social Determinants of Health (SDOH) Interventions SDOH Screenings   Food Insecurity: No Food Insecurity (09/22/2022)  Housing: Low Risk  (09/22/2022)  Transportation Needs: No Transportation Needs (09/22/2022)  Utilities: Not At Risk (09/22/2022)  Depression (PHQ2-9): Low Risk  (10/04/2019)  Tobacco Use: High Risk (09/22/2022)     Readmission Risk Interventions     No data to display

## 2022-09-27 ENCOUNTER — Telehealth: Payer: Self-pay

## 2022-09-27 NOTE — Transitions of Care (Post Inpatient/ED Visit) (Signed)
   09/27/2022  Name: Anthony Woods MRN: [id number] DOB: 06-May-1955  Today's TOC FU Call Status: Today's TOC FU Call Status:: Unsuccessul Call (1st Attempt) Unsuccessful Call (1st Attempt) Date: 09/27/22  Attempted to reach the patient regarding the most recent Inpatient/ED visit.  Follow Up Plan: Additional outreach attempts will be made to reach the patient to complete the Transitions of Care (Post Inpatient/ED visit) call.   Signature   Woodfin Ganja LPN [facility] Nurse Health Advisor Direct Dial [phone]

## 2022-09-28 NOTE — Transitions of Care (Post Inpatient/ED Visit) (Signed)
   09/28/2022  Name: Anthony Woods MRN: [id number] DOB: [birth year]  Today's TOC FU Call Status: Today's TOC FU Call Status:: Unsuccessful Call (2nd Attempt) Unsuccessful Call (1st Attempt) Date: 09/27/22 Unsuccessful Call (2nd Attempt) Date: 09/28/22  Attempted to reach the patient regarding the most recent Inpatient/ED visit.  Follow Up Plan: Additional outreach attempts will be made to reach the patient to complete the Transitions of Care (Post Inpatient/ED visit) call.   Signature   Woodfin Ganja LPN [facility] Nurse Health Advisor Direct Dial [phone]

## 2022-10-04 NOTE — Transitions of Care (Post Inpatient/ED Visit) (Signed)
   10/04/2022  Name: Anthony Woods MRN: [id number] DOB: 1955-05-13  Today's TOC FU Call Status: Today's TOC FU Call Status:: Successful TOC FU Call Competed Unsuccessful Call (1st Attempt) Date: 09/27/22 Unsuccessful Call (2nd Attempt) Date: 09/28/22 Unsuccessful Call (3rd Attempt) Date: 10/04/22 [facility] [address] FU Call Complete Date: 10/04/22  Attempted to reach the patient regarding the most recent Inpatient/ED visit.  Follow Up Plan: No further outreach attempts will be made at this time. We have been unable to contact the patient.  Signature   Woodfin Ganja LPN [facility] Nurse Health Advisor Direct Dial [phone]

## 2022-10-05 ENCOUNTER — Telehealth: Payer: Self-pay | Admitting: Internal Medicine

## 2022-10-05 ENCOUNTER — Encounter
Payer: Medicare Other | Attending: Physical Medicine and Rehabilitation | Admitting: Physical Medicine and Rehabilitation

## 2022-10-05 DIAGNOSIS — I480 Paroxysmal atrial fibrillation: Secondary | ICD-10-CM

## 2022-10-05 DIAGNOSIS — Z8673 Personal history of transient ischemic attack (TIA), and cerebral infarction without residual deficits: Secondary | ICD-10-CM | POA: Diagnosis not present

## 2022-10-05 MED ORDER — FUROSEMIDE 20 MG PO TABS: 20.0000 mg | ORAL_TABLET | Freq: Every day | ORAL | 3 refills | Status: AC | PRN

## 2022-10-05 MED ORDER — ACARBOSE 50 MG PO TABS: 50.0000 mg | ORAL_TABLET | Freq: Three times a day (TID) | ORAL | 0 refills | Status: DC

## 2022-10-05 MED ORDER — TRAZODONE HCL 150 MG PO TABS: 75.0000 mg | ORAL_TABLET | Freq: Every day | ORAL | 0 refills | Status: DC

## 2022-10-05 MED ORDER — QUETIAPINE FUMARATE 50 MG PO TABS: 50.0000 mg | ORAL_TABLET | Freq: Every day | ORAL | 0 refills | Status: DC

## 2022-10-05 MED ORDER — ROSUVASTATIN CALCIUM 20 MG PO TABS: 20.0000 mg | ORAL_TABLET | Freq: Every day | ORAL | 0 refills | Status: DC

## 2022-10-05 MED ORDER — AMIODARONE HCL 200 MG PO TABS: 200.0000 mg | ORAL_TABLET | Freq: Two times a day (BID) | ORAL | 0 refills | Status: DC

## 2022-10-05 MED ORDER — LINAGLIPTIN 5 MG PO TABS: 5.0000 mg | ORAL_TABLET | Freq: Every day | ORAL | 0 refills | Status: DC

## 2022-10-05 MED ORDER — AMIODARONE HCL 200 MG PO TABS: 200.0000 mg | ORAL_TABLET | Freq: Every day | ORAL | 0 refills | Status: DC

## 2022-10-05 MED ORDER — ASPIRIN 81 MG PO TBEC: 81.0000 mg | DELAYED_RELEASE_TABLET | Freq: Every day | ORAL | 0 refills | Status: DC

## 2022-10-05 MED ORDER — EZETIMIBE 10 MG PO TABS: 10.0000 mg | ORAL_TABLET | Freq: Every day | ORAL | 0 refills | Status: DC

## 2022-10-05 MED ORDER — APIXABAN 5 MG PO TABS: 5.0000 mg | ORAL_TABLET | Freq: Two times a day (BID) | ORAL | 0 refills | Status: DC

## 2022-10-05 MED ORDER — METOPROLOL SUCCINATE ER 25 MG PO TB24: 12.5000 mg | ORAL_TABLET | Freq: Every day | ORAL | 0 refills | Status: DC

## 2022-10-05 MED ORDER — LEVOTHYROXINE SODIUM 175 MCG PO TABS: 175.0000 ug | ORAL_TABLET | Freq: Every day | ORAL | 0 refills | Status: DC

## 2022-10-05 MED ORDER — METFORMIN HCL 1000 MG PO TABS: 1000.0000 mg | ORAL_TABLET | Freq: Two times a day (BID) | ORAL | 0 refills | Status: DC

## 2022-10-05 NOTE — Telephone Encounter (Signed)
Pt wants to know can she get a alternate drug other than apixaban (ELIQUIS) 5 MG TABS tablet  pt sister states the medication is 596.00 dollars and she can't afford that. Can we please get a nurse to reach out to sister.  Best call back [phone]

## 2022-10-05 NOTE — Progress Notes (Signed)
   Subjective:    Patient ID: [name], male    DOB: Dec 04, 1955, 67 y.o.   MRN: [id number]  HPI An audio/video tele-health visit is felt to be the most appropriate encounter for this patient at this time. This is a follow up tele-visit via phone. The patient is at home. MD is at office. Prior to scheduling this appointment, our staff discussed the limitations of evaluation and management by telemedicine and the availability of in-person appointments. The patient expressed understanding and agreed to proceed.   Mr. Stohr is a 67 year old man who was seen in CIR for CVA  1) CVA -he has bee recovering well -his sisters and brother have been caring for him as he was recently hospitalized -he needs all his medications refilled   Review of Systems     Objective:   Physical Exam Not performed      Assessment & Plan:   1) CVA -all medications refilled  2) Atrial fibrillation -discussed with sister that family is unable to afford $500 cost of Eliquis -advised following up with PCP regarding alternative options such as warfarin -advised use of turmeric with black pepper in the mean time -discussed that amiodarone and metoprolol help to control heart rate while Eliquis thins the blood  3) Type 2 diabetes -discussed that the Tradjenta is too costly for him too, but he has enough currently until tomorrow at bedtime -continue metformin and acarbose -discussed avoiding added sugars -discussed avoiding artificial sweeteners  25 minutes spent in discussion if his CVA, refilling his medications, discussed that family [place] unable to pay for the Eliquis or the Tradjenta, discussed that his brother has been checking his blood sugars, advised avoiding added sugars and artifical sweeteners, discussed to give me a call if CBGs are elevated and I can send him insulin

## 2022-10-12 NOTE — Telephone Encounter (Signed)
Called sister Olegario Messier) gave MD response. Olegario Messier states they are coming in on 7/29 will talk w/ MD about starting.Marland KitchenRaechel Chute

## 2022-10-12 NOTE — Telephone Encounter (Signed)
It would be Coumadin.  Can they bring [place] to Coumadin clinic periodically?  Thank you

## 2022-10-18 ENCOUNTER — Encounter: Payer: Self-pay | Admitting: Internal Medicine

## 2022-10-18 ENCOUNTER — Ambulatory Visit (INDEPENDENT_AMBULATORY_CARE_PROVIDER_SITE_OTHER): Payer: Medicare Other | Admitting: Internal Medicine

## 2022-10-18 ENCOUNTER — Other Ambulatory Visit: Payer: Self-pay | Admitting: Internal Medicine

## 2022-10-18 VITALS — BP 130/80 | HR 60 | Temp 98.6°F | Ht 65.0 in | Wt 170.0 lb

## 2022-10-18 DIAGNOSIS — I10 Essential (primary) hypertension: Secondary | ICD-10-CM

## 2022-10-18 DIAGNOSIS — Z8673 Personal history of transient ischemic attack (TIA), and cerebral infarction without residual deficits: Secondary | ICD-10-CM

## 2022-10-18 DIAGNOSIS — J449 Chronic obstructive pulmonary disease, unspecified: Secondary | ICD-10-CM

## 2022-10-18 DIAGNOSIS — Z7984 Long term (current) use of oral hypoglycemic drugs: Secondary | ICD-10-CM

## 2022-10-18 DIAGNOSIS — E1169 Type 2 diabetes mellitus with other specified complication: Secondary | ICD-10-CM

## 2022-10-18 DIAGNOSIS — I480 Paroxysmal atrial fibrillation: Secondary | ICD-10-CM

## 2022-10-18 DIAGNOSIS — E669 Obesity, unspecified: Secondary | ICD-10-CM | POA: Diagnosis not present

## 2022-10-18 DIAGNOSIS — R410 Disorientation, unspecified: Secondary | ICD-10-CM

## 2022-10-18 LAB — COMPREHENSIVE METABOLIC PANEL
ALT: 18 U/L (ref 0–53)
AST: 18 U/L (ref 0–37)
Albumin: 4.1 g/dL (ref 3.5–5.2)
Alkaline Phosphatase: 71 U/L (ref 39–117)
BUN: 13 mg/dL (ref 6–23)
CO2: 31 mEq/L (ref 19–32)
Calcium: 10.4 mg/dL (ref 8.4–10.5)
Chloride: 100 mEq/L (ref 96–112)
Creatinine, Ser: 0.79 mg/dL (ref 0.40–1.50)
GFR: 92.73 mL/min (ref >60.00)
Glucose, Bld: 238 mg/dL — ABNORMAL HIGH (ref 70–99)
Potassium: 3.9 mEq/L (ref 3.5–5.1)
Sodium: 139 mEq/L (ref 135–145)
Total Bilirubin: 0.3 mg/dL (ref 0.2–1.2)
Total Protein: 6.6 g/dL (ref 6.0–8.3)

## 2022-10-18 LAB — CBC WITH DIFFERENTIAL/PLATELET
Basophils Absolute: 0.1 10*3/uL (ref 0.0–0.1)
Basophils Relative: 1.3 % (ref 0.0–3.0)
Eosinophils Absolute: 0.3 10*3/uL (ref 0.0–0.7)
Eosinophils Relative: 3.9 % (ref 0.0–5.0)
HCT: 38.6 % — ABNORMAL LOW (ref 39.0–52.0)
Hemoglobin: 12.9 g/dL — ABNORMAL LOW (ref 13.0–17.0)
Lymphocytes Relative: 32.1 % (ref 12.0–46.0)
Lymphs Abs: 2.4 10*3/uL (ref 0.7–4.0)
MCHC: 33.4 g/dL (ref 30.0–36.0)
MCV: 92.9 fl (ref 78.0–100.0)
Monocytes Absolute: 0.5 10*3/uL (ref 0.1–1.0)
Monocytes Relative: 6.3 % (ref 3.0–12.0)
Neutro Abs: 4.1 10*3/uL (ref 1.4–7.7)
Neutrophils Relative %: 56.4 % (ref 43.0–77.0)
Platelets: 191 10*3/uL (ref 150.0–400.0)
RBC: 4.16 Mil/uL — ABNORMAL LOW (ref 4.22–5.81)
RDW: 13.2 % (ref 11.5–15.5)
WBC: 7.3 10*3/uL (ref 4.0–10.5)

## 2022-10-18 LAB — HEMOGLOBIN A1C: Hgb A1c MFr Bld: 9.9 % — ABNORMAL HIGH (ref 4.6–6.5)

## 2022-10-18 MED ORDER — REPAGLINIDE 1 MG PO TABS: 1.0000 mg | ORAL_TABLET | Freq: Three times a day (TID) | ORAL | 5 refills | Status: DC

## 2022-10-18 NOTE — Assessment & Plan Note (Signed)
On Actos. Metformin CBG 374 Labs today - may need Insulin. Off Ozempic due to wt loss

## 2022-10-18 NOTE — Assessment & Plan Note (Addendum)
In NSR now Off Eliquis due to cost Start ASA 325 mg/mo Discussed options, ie Coumadin

## 2022-10-18 NOTE — Progress Notes (Signed)
Subjective:  Patient ID: Anthony Woods, male    DOB: Aug 24, 1955  Age: 67 y.o. MRN: [id number]  CC: Follow-up (B/L knee weakness and pain... Rt knee swelling and b/l ankle swelling)   HPI Anthony Woods presents for CVA Here w/Kathy - sister and wife C/o confusion at night - on Seroquel  Outpatient Medications Prior to Visit  Medication Sig Dispense Refill   acarbose (PRECOSE) 50 MG tablet Take 1 tablet (50 mg total) by mouth 3 (three) times daily with meals. 90 tablet 0   acetaminophen (TYLENOL) 325 MG tablet Take 1-2 tablets (325-650 mg total) by mouth every 6 (six) hours as needed for moderate pain or mild pain.     amiodarone (PACERONE) 200 MG tablet Take 1 tablet (200 mg total) by mouth daily. 30 tablet 0   aspirin EC 81 MG tablet Take 1 tablet (81 mg total) by mouth daily for 30 days then as directed by MD .Swallow whole. 120 tablet 0   Continuous Glucose Receiver (FREESTYLE LIBRE 3 READER) DEVI 1 each by Does not apply route daily. 1 each 3   Continuous Glucose Sensor (FREESTYLE LIBRE 3 SENSOR) MISC 1 each by Does not apply route daily. Place 1 sensor on the skin every 14 days. Use to check glucose continuously 2 each 3   Cyanocobalamin (B-12) 500 MCG TABS TAKE 1 TABLET (500 MCG TOTAL) BY MOUTH DAILY. (Patient taking differently: Take 1 tablet by mouth daily.) 90 tablet 3   diphenhydrAMINE (BENADRYL) 25 mg capsule Take 25 mg by mouth as needed (bee stings).     EPINEPHrine (EPIPEN 2-PAK) 0.3 mg/0.3 mL IJ SOAJ injection Inject 0.3 mg into the muscle as needed for anaphylaxis. 2 each 1   ezetimibe (ZETIA) 10 MG tablet Take 1 tablet (10 mg total) by mouth daily. 30 tablet 0   furosemide (LASIX) 20 MG tablet Take 1 tablet (20 mg total) by mouth daily as needed. 30 tablet 3   levothyroxine (SYNTHROID) 175 MCG tablet Take 1 tablet (175 mcg total) by mouth daily at 6 (six) AM. 30 tablet 0   melatonin 3 MG TABS tablet Take 1 tablet (3 mg total) by mouth at bedtime. 30 tablet 0   metFORMIN  (GLUCOPHAGE) 1000 MG tablet Take 1 tablet (1,000 mg total) by mouth 2 (two) times daily with a meal. 60 tablet 0   metoprolol succinate (TOPROL-XL) 25 MG 24 hr tablet Take 0.5 tablets (12.5 mg total) by mouth daily. 15 tablet 0   nitroGLYCERIN (NITROSTAT) 0.4 MG SL tablet Place 1 tablet (0.4 mg total) under the tongue every 5 (five) minutes as needed for chest pain. 20 tablet 3   QUEtiapine (SEROQUEL) 50 MG tablet Take 1 tablet (50 mg total) by mouth at bedtime. 30 tablet 0   rosuvastatin (CRESTOR) 20 MG tablet Take 1 tablet (20 mg total) by mouth daily. 30 tablet 0   traZODone (DESYREL) 150 MG tablet Take 0.5 tablets (75 mg total) by mouth at bedtime. 15 tablet 0   apixaban (ELIQUIS) 5 MG TABS tablet Take 1 tablet (5 mg total) by mouth 2 (two) times daily. 60 tablet 0   magnesium oxide (MAG-OX) 400 (240 Mg) MG tablet Take 1 tablet (400 mg total) by mouth daily at 2 PM for 30 days then as directed by MD 120 tablet 0   OZEMPIC, 0.25 OR 0.5 MG/DOSE, 2 MG/1.5ML SOPN Inject 0.5 mg into the skin once a week.  6   linagliptin (TRADJENTA) 5 MG TABS tablet Take  1 tablet (5 mg total) by mouth daily. (Patient not taking: Reported on 10/18/2022) 30 tablet 0   No facility-administered medications prior to visit.    ROS: Review of Systems  Constitutional:  Negative for appetite change, fatigue and unexpected weight change.  HENT:  Negative for congestion, nosebleeds, sneezing, sore throat and trouble swallowing.   Eyes:  Negative for itching and visual disturbance.  Respiratory:  Negative for cough.   Cardiovascular:  Negative for chest pain, palpitations and leg swelling.  Gastrointestinal:  Negative for abdominal distention, blood in stool, diarrhea and nausea.  Genitourinary:  Negative for frequency and hematuria.  Musculoskeletal:  Positive for gait problem. Negative for back pain, joint swelling and neck pain.  Skin:  Negative for rash.  Neurological:  Positive for weakness. Negative for dizziness,  tremors and speech difficulty.  Hematological:  Bruises/bleeds easily.  Psychiatric/Behavioral:  Positive for confusion and dysphoric mood. Negative for agitation and sleep disturbance. The patient is not nervous/anxious.     Objective:  BP 130/80 (BP Location: Left Arm, Patient Position: Sitting, Cuff Size: Large)   Pulse 60   Temp 98.6 F (37 C) (Oral)   Ht 5\' 5"  (1.651 m)   Wt 170 lb (77.1 kg)   SpO2 96%   BMI 28.29 kg/m   BP Readings from Last 3 Encounters:  10/18/22 130/80  09/25/22 (!) 156/74  09/03/22 122/78    Wt Readings from Last 3 Encounters:  10/18/22 170 lb (77.1 kg)  09/25/22 169 lb 8 oz (76.9 kg)  09/01/22 171 lb 8.3 oz (77.8 kg)    Physical Exam Constitutional:      General: He is not in acute distress.    Appearance: Normal appearance. He is well-developed.     Comments: NAD  Eyes:     Conjunctiva/sclera: Conjunctivae normal.     Pupils: Pupils are equal, round, and reactive to light.  Neck:     Thyroid: No thyromegaly.     Vascular: No JVD.  Cardiovascular:     Rate and Rhythm: Normal rate and regular rhythm.     Heart sounds: Normal heart sounds. No murmur heard.    No friction rub. No gallop.  Pulmonary:     Effort: Pulmonary effort is normal. No respiratory distress.     Breath sounds: Normal breath sounds. No wheezing or rales.  Chest:     Chest wall: No tenderness.  Abdominal:     General: Bowel sounds are normal. There is no distension.     Palpations: Abdomen is soft. There is no mass.     Tenderness: There is no abdominal tenderness. There is no guarding or rebound.  Musculoskeletal:        General: No tenderness. Normal range of motion.     Cervical back: Normal range of motion.     Right lower leg: No edema.     Left lower leg: No edema.  Lymphadenopathy:     Cervical: No cervical adenopathy.  Skin:    General: Skin is warm and dry.     Findings: No rash.  Neurological:     Mental Status: He is alert. He is disoriented.      Cranial Nerves: No cranial nerve deficit.     Motor: Weakness present. No abnormal muscle tone.     Coordination: Coordination abnormal.     Gait: Gait abnormal.     Deep Tendon Reflexes: Reflexes are normal and symmetric.  Psychiatric:        Behavior: Behavior normal.  Thought Content: Thought content normal.        Judgment: Judgment normal.   RRR w/irreg beats  Using a walker   Lab Results  Component Value Date   WBC 4.6 09/24/2022   HGB 11.8 (L) 09/24/2022   HCT 34.3 (L) 09/24/2022   PLT 168 09/24/2022   GLUCOSE 203 (H) 09/24/2022   CHOL 197 08/13/2022   TRIG 109 08/13/2022   HDL 47 08/13/2022   LDLDIRECT 100.0 12/09/2014   LDLCALC 128 (H) 08/13/2022   ALT 84 (H) 09/22/2022   AST 69 (H) 09/22/2022   NA 139 09/24/2022   K 3.3 (L) 09/24/2022   CL 104 09/24/2022   CREATININE 0.68 09/24/2022   BUN 9 09/24/2022   CO2 25 09/24/2022   TSH 2.708 09/22/2022   PSA 0.41 09/14/2017   INR 1.0 08/10/2022   HGBA1C >15.5 (H) 08/11/2022   MICROALBUR 4.7 (H) 12/06/2013    ECHOCARDIOGRAM LIMITED  Result Date: 09/22/2022    ECHOCARDIOGRAM LIMITED REPORT   Patient Name:   Cheyenne A Prada Date of Exam: 09/22/2022 Medical Rec #:  [id number]      Height:       65.0 in Accession #:    [id number]     Weight:       166.0 lb Date of Birth:  09-Jun-1955      BSA:          1.828 m Patient Age:    67 years       BP:           97/65 mmHg Patient Gender: M              HR:           98 bpm. Exam Location:  Inpatient Procedure: Limited Echo and Limited Color Doppler Indications:     A fib  History:         Patient has prior history of Echocardiogram examinations, most                  recent 08/13/2022. Arrythmias:Atrial Fibrillation,                  Signs/Symptoms:Chest Pain; Risk Factors:Hypertension, Diabetes                  and Current Smoker.  Sonographer:     Melissa Morford RDCS (AE, PE) Referring Phys:  [id number] Laurann Montana Diagnosing Phys: Epifanio Lesches MD IMPRESSIONS  1. Technically  difficult study, in Afib with RVR  2. Left ventricular ejection fraction, by estimation, is 50 to 55%. The left ventricle has low normal function. The left ventricle has no regional wall motion abnormalities. There is moderate left ventricular hypertrophy.  3. Right ventricular systolic function is normal. The right ventricular size is normal. Tricuspid regurgitation signal is inadequate for assessing PA pressure.  4. The mitral valve is normal in structure. Trivial mitral valve regurgitation.  5. The aortic valve was not well visualized. Aortic valve regurgitation is not visualized.  6. The inferior vena cava is normal in size with greater than 50% respiratory variability, suggesting right atrial pressure of 3 mmHg. Conclusion(s)/Recommendation(s): Moderate LVH on echo but low voltage on EKG, consider evaluation for cardiac amyloidosis. FINDINGS  Left Ventricle: Left ventricular ejection fraction, by estimation, is 50 to 55%. The left ventricle has low normal function. The left ventricle has no regional wall motion abnormalities. The left ventricular internal cavity size was normal in size. There is moderate left ventricular  hypertrophy. Right Ventricle: The right ventricular size is normal. Right ventricular systolic function is normal. Tricuspid regurgitation signal is inadequate for assessing PA pressure. Pericardium: There is no evidence of pericardial effusion. Mitral Valve: The mitral valve is normal in structure. Trivial mitral valve regurgitation. Aortic Valve: The aortic valve was not well visualized. Aortic valve regurgitation is not visualized. Aorta: The aortic root is normal in size and structure. Venous: The inferior vena cava is normal in size with greater than 50% respiratory variability, suggesting right atrial pressure of 3 mmHg. LEFT VENTRICLE PLAX 2D LVIDd:         4.00 cm LVIDs:         3.10 cm LV PW:         1.20 cm LV IVS:        1.10 cm  LEFT ATRIUM         Index LA diam:    4.20 cm 2.30  cm/m   AORTA Ao Root diam: 3.40 cm Epifanio Lesches MD Electronically signed by Epifanio Lesches MD Signature Date/Time: 09/22/2022/11:41:44 AM    Final (Updated)    DG Chest Port 1 View  Result Date: 09/22/2022 CLINICAL DATA:  Chest pain and palpitations. EXAM: PORTABLE CHEST 1 VIEW COMPARISON:  08/11/2022 FINDINGS: Stable cardiomediastinal contours. Aortic atherosclerotic calcifications. Decreased lung volumes. No pleural fluid, interstitial edema or airspace disease. Osseous structures appear grossly intact. IMPRESSION: Low lung volumes. No active disease. Electronically Signed   By: Signa Kell M.D.   On: 09/22/2022 05:44    Assessment & Plan:   Problem List Items Addressed This Visit     Diabetes mellitus type 2 in obese - Primary    On Actos. Metformin CBG 374 Labs today - may need Insulin. Off Ozempic due to wt loss      Relevant Orders   Comprehensive metabolic panel   CBC with Differential/Platelet   Hemoglobin A1c   Essential hypertension    BP is controlled      Atrial fibrillation (HCC)    In NSR now Off Eliquis due to cost Start ASA 325 mg/mo Discussed options, ie Coumadin      Relevant Orders   Comprehensive metabolic panel   CBC with Differential/Platelet   Hemoglobin A1c   COPD (chronic obstructive pulmonary disease) (HCC)    Smoking again      History of CVA (cerebrovascular accident) (Chronic)    Smoking again  Off Eliquis due to cost Start ASA 325 mg/mo      Relevant Orders   Comprehensive metabolic panel   CBC with Differential/Platelet   Hemoglobin A1c   Confusion    Post-CVA - on serquel po         No orders of the defined types were placed in this encounter.     Follow-up: No follow-ups on file.  Sonda Primes, MD

## 2022-10-18 NOTE — Assessment & Plan Note (Signed)
BP is controlled 

## 2022-10-18 NOTE — Assessment & Plan Note (Signed)
Post-CVA - on serquel po

## 2022-10-18 NOTE — Assessment & Plan Note (Signed)
Smoking again

## 2022-10-18 NOTE — Assessment & Plan Note (Signed)
Start Prandin

## 2022-10-18 NOTE — Assessment & Plan Note (Addendum)
Smoking again  Off Eliquis due to cost Start ASA 325 mg/mo

## 2022-10-19 DIAGNOSIS — F419 Anxiety disorder, unspecified: Secondary | ICD-10-CM

## 2022-10-19 DIAGNOSIS — G4700 Insomnia, unspecified: Secondary | ICD-10-CM

## 2022-10-19 DIAGNOSIS — Z955 Presence of coronary angioplasty implant and graft: Secondary | ICD-10-CM

## 2022-10-19 DIAGNOSIS — E876 Hypokalemia: Secondary | ICD-10-CM

## 2022-10-19 DIAGNOSIS — R2681 Unsteadiness on feet: Secondary | ICD-10-CM

## 2022-10-19 DIAGNOSIS — E1169 Type 2 diabetes mellitus with other specified complication: Secondary | ICD-10-CM

## 2022-10-19 DIAGNOSIS — E785 Hyperlipidemia, unspecified: Secondary | ICD-10-CM

## 2022-10-19 DIAGNOSIS — I480 Paroxysmal atrial fibrillation: Secondary | ICD-10-CM

## 2022-10-19 DIAGNOSIS — Z6834 Body mass index (BMI) 34.0-34.9, adult: Secondary | ICD-10-CM

## 2022-10-19 DIAGNOSIS — H3530 Unspecified macular degeneration: Secondary | ICD-10-CM

## 2022-10-19 DIAGNOSIS — E039 Hypothyroidism, unspecified: Secondary | ICD-10-CM

## 2022-10-19 DIAGNOSIS — Z9181 History of falling: Secondary | ICD-10-CM

## 2022-10-19 DIAGNOSIS — M1990 Unspecified osteoarthritis, unspecified site: Secondary | ICD-10-CM

## 2022-10-19 DIAGNOSIS — Z7985 Long-term (current) use of injectable non-insulin antidiabetic drugs: Secondary | ICD-10-CM

## 2022-10-19 DIAGNOSIS — I2510 Atherosclerotic heart disease of native coronary artery without angina pectoris: Secondary | ICD-10-CM

## 2022-10-19 DIAGNOSIS — I69398 Other sequelae of cerebral infarction: Secondary | ICD-10-CM

## 2022-10-19 DIAGNOSIS — Z87891 Personal history of nicotine dependence: Secondary | ICD-10-CM

## 2022-10-19 DIAGNOSIS — I69328 Other speech and language deficits following cerebral infarction: Secondary | ICD-10-CM

## 2022-10-19 DIAGNOSIS — I10 Essential (primary) hypertension: Secondary | ICD-10-CM

## 2022-10-19 DIAGNOSIS — Z7984 Long term (current) use of oral hypoglycemic drugs: Secondary | ICD-10-CM

## 2022-10-20 ENCOUNTER — Telehealth: Payer: Self-pay | Admitting: Internal Medicine

## 2022-10-20 NOTE — Telephone Encounter (Signed)
Called Vernona Rieger ok verbal w/ PT, OT and SW.Pt is in good compliance w/appt .Raechel Chute

## 2022-10-20 NOTE — Telephone Encounter (Signed)
Caller & What [employer]:  Vernona Rieger with [facility]   Phone Number:[phone]   Needs Verbal orders for what service & frequency:PT once a week for 3 weeks, ot evaluation and social work evaluation - 2 drug interactions -  Pacerone  & seroquel and trazadone

## 2022-10-25 ENCOUNTER — Encounter: Payer: Self-pay | Admitting: Diagnostic Neuroimaging

## 2022-10-25 ENCOUNTER — Ambulatory Visit (INDEPENDENT_AMBULATORY_CARE_PROVIDER_SITE_OTHER): Payer: Medicare Other | Admitting: Diagnostic Neuroimaging

## 2022-10-25 VITALS — BP 128/71 | HR 64 | Ht 65.0 in | Wt 167.6 lb

## 2022-10-25 DIAGNOSIS — E7849 Other hyperlipidemia: Secondary | ICD-10-CM

## 2022-10-25 DIAGNOSIS — I480 Paroxysmal atrial fibrillation: Secondary | ICD-10-CM

## 2022-10-25 DIAGNOSIS — I639 Cerebral infarction, unspecified: Secondary | ICD-10-CM | POA: Diagnosis not present

## 2022-10-25 DIAGNOSIS — Z7985 Long-term (current) use of injectable non-insulin antidiabetic drugs: Secondary | ICD-10-CM

## 2022-10-25 DIAGNOSIS — E1149 Type 2 diabetes mellitus with other diabetic neurological complication: Secondary | ICD-10-CM

## 2022-10-25 NOTE — Patient Instructions (Signed)
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

## 2022-10-25 NOTE — Progress Notes (Signed)
[facility]  PATIENT: Anthony Woods DOB: [birth year]  REFERRING CLINICIAN: Milinda Antis, PA-C HISTORY FROM: patient REASON FOR VISIT: new consult   HISTORICAL  CHIEF COMPLAINT:  Chief Complaint  Patient presents with   Hospitalization Follow-up    Rm 7. Accompanied by wife. C/o weakness in bilateral knees post stroke, right greater than left. Wife reports a fall this morning. He has balance concerns.    HISTORY OF PRESENT ILLNESS:   UPDATE (10/25/22, VRP): Since discharge, feels stable. Cannot afford eliquis, now on aspirin 325mg  daily. Planning to consider coumadin. Symptoms are stable. Still with balance and gait diff. Living at home. Using walker.   PRIOR HPI (08/12/22, Dr. Selina Cooley): 67 y.o. male with a past medical history of CAD, hypertension, hyperlipidemia, atrial fibrillation on Xarelto, diabetes, and hypothyroidism who initially presented on 5/21 after a fall.  He was then discharged as his wife wanted to take him home.  He returned on 5/22 as a level 2 trauma due to fall on thinners with increasing confusion as well as urinary symptoms.  Additionally it is noted that he has had slurred speech, intermittent weakness on the right side, as well as fluctuating confusion over the last 3 weeks. His wife and sisters are at the bedside. They tell me that for a period of time at the beginning of the year he was not compliant with his medications but had started to take them regularly at the beginning of May. He states that he is not sure if he is taking his medications correctly. Since admission yesterday patient reports no change in his R sided .  REVIEW OF SYSTEMS: Full 14 system review of systems performed and negative with exception of: as per HPI.  ALLERGIES: Allergies  Allergen Reactions   Bee Venom Anaphylaxis    Other reaction(s): Unknown   Actos [Pioglitazone Hydrochloride] Swelling   Lipitor [Atorvastatin] Other (See Comments)    Patient cannot  recall reaction   Lisinopril Other (See Comments)    cough    HOME MEDICATIONS: Outpatient Medications Prior to Visit  Medication Sig Dispense Refill   acarbose (PRECOSE) 50 MG tablet Take 1 tablet (50 mg total) by mouth 3 (three) times daily with meals. 90 tablet 0   acetaminophen (TYLENOL) 325 MG tablet Take 1-2 tablets (325-650 mg total) by mouth every 6 (six) hours as needed for moderate pain or mild pain.     amiodarone (PACERONE) 200 MG tablet Take 1 tablet (200 mg total) by mouth daily. 30 tablet 0   aspirin EC 81 MG tablet Take 1 tablet (81 mg total) by mouth daily for 30 days then as directed by MD .Swallow whole. (Patient taking differently: Take 325 mg by mouth daily.) 120 tablet 0   Continuous Glucose Receiver (FREESTYLE LIBRE 3 READER) DEVI 1 each by Does not apply route daily. 1 each 3   Continuous Glucose Sensor (FREESTYLE LIBRE 3 SENSOR) MISC 1 each by Does not apply route daily. Place 1 sensor on the skin every 14 days. Use to check glucose continuously 2 each 3   Cyanocobalamin (B-12) 500 MCG TABS TAKE 1 TABLET (500 MCG TOTAL) BY MOUTH DAILY. (Patient taking differently: Take 1 tablet by mouth daily.) 90 tablet 3   diphenhydrAMINE (BENADRYL) 25 mg capsule Take 25 mg by mouth as needed (bee stings).     ezetimibe (ZETIA) 10 MG tablet Take 1 tablet (10 mg total) by mouth daily. 30 tablet 0   furosemide (LASIX) 20 MG tablet Take  1 tablet (20 mg total) by mouth daily as needed. 30 tablet 3   levothyroxine (SYNTHROID) 175 MCG tablet Take 1 tablet (175 mcg total) by mouth daily at 6 (six) AM. 30 tablet 0   melatonin 3 MG TABS tablet Take 1 tablet (3 mg total) by mouth at bedtime. 30 tablet 0   metFORMIN (GLUCOPHAGE) 1000 MG tablet Take 1 tablet (1,000 mg total) by mouth 2 (two) times daily with a meal. 60 tablet 0   metoprolol succinate (TOPROL-XL) 25 MG 24 hr tablet Take 0.5 tablets (12.5 mg total) by mouth daily. 15 tablet 0   QUEtiapine (SEROQUEL) 50 MG tablet Take 1 tablet (50  mg total) by mouth at bedtime. 30 tablet 0   repaglinide (PRANDIN) 1 MG tablet Take 1 tablet (1 mg total) by mouth 3 (three) times daily before meals. 90 tablet 5   rosuvastatin (CRESTOR) 20 MG tablet Take 1 tablet (20 mg total) by mouth daily. 30 tablet 0   traZODone (DESYREL) 150 MG tablet Take 0.5 tablets (75 mg total) by mouth at bedtime. 15 tablet 0   EPINEPHrine (EPIPEN 2-PAK) 0.3 mg/0.3 mL IJ SOAJ injection Inject 0.3 mg into the muscle as needed for anaphylaxis. (Patient not taking: Reported on 10/25/2022) 2 each 1   nitroGLYCERIN (NITROSTAT) 0.4 MG SL tablet Place 1 tablet (0.4 mg total) under the tongue every 5 (five) minutes as needed for chest pain. (Patient not taking: Reported on 10/25/2022) 20 tablet 3   No facility-administered medications prior to visit.      PHYSICAL EXAM  GENERAL EXAM/CONSTITUTIONAL: Vitals:  Vitals:   10/25/22 0855  BP: 128/71  Pulse: 64  Weight: 167 lb 9.6 oz (76 kg)  Height: 5\' 5"  (1.651 m)   Body mass index is 27.89 kg/m. Wt Readings from Last 3 Encounters:  10/25/22 167 lb 9.6 oz (76 kg)  10/18/22 170 lb (77.1 kg)  09/25/22 169 lb 8 oz (76.9 kg)   Patient is in no distress; well developed, nourished and groomed; neck is supple  CARDIOVASCULAR: Examination of carotid arteries is normal; no carotid bruits Regular rate and rhythm, no murmurs Examination of peripheral vascular system by observation and palpation is normal  EYES: Ophthalmoscopic exam of optic discs and posterior segments is normal; no papilledema or hemorrhages No results found.  MUSCULOSKELETAL: Gait, strength, tone, movements noted in Neurologic exam below  NEUROLOGIC: MENTAL STATUS:      No data to display         awake, alert, oriented to person, place and time recent and remote memory intact normal attention and concentration language fluent, comprehension intact, naming intact fund of knowledge appropriate  CRANIAL NERVE:  2nd - no papilledema on  fundoscopic exam 2nd, 3rd, 4th, 6th - pupils equal and reactive to light, visual fields full to confrontation, extraocular muscles intact, no nystagmus 5th - facial sensation symmetric 7th - facial strength symmetric 8th - hearing intact 9th - palate elevates symmetrically, uvula midline 11th - shoulder shrug symmetric 12th - tongue protrusion midline MILD DYSARTHRIA  MOTOR:  normal bulk and tone, DIFFUSE 4+ strength in the BUE, BLE  SENSORY:  normal and symmetric to light touch, temperature, vibration; EXCEPT DECR IN FEET  COORDINATION:  finger-nose-finger, fine finger movements normal  REFLEXES:  deep tendon reflexes 1+ and symmetric  GAIT/STATION:  narrow based gait; USING WALKER; SLOW STEPS     DIAGNOSTIC DATA (LABS, IMAGING, TESTING) - I reviewed patient records, labs, notes, testing and imaging myself where available.  Lab  Results  Component Value Date   WBC 7.3 10/18/2022   HGB 12.9 (L) 10/18/2022   HCT 38.6 (L) 10/18/2022   MCV 92.9 10/18/2022   PLT 191.0 10/18/2022      Component Value Date/Time   NA 139 10/18/2022 1532   NA 138 06/04/2020 1002   K 3.9 10/18/2022 1532   CL 100 10/18/2022 1532   CO2 31 10/18/2022 1532   GLUCOSE 238 (H) 10/18/2022 1532   GLUCOSE 156 (H) 02/08/2006 0757   BUN 13 10/18/2022 1532   BUN 14 06/04/2020 1002   CREATININE 0.79 10/18/2022 1532   CALCIUM 10.4 10/18/2022 1532   PROT 6.6 10/18/2022 1532   PROT 6.7 06/04/2020 1002   ALBUMIN 4.1 10/18/2022 1532   ALBUMIN 4.3 06/04/2020 1002   AST 18 10/18/2022 1532   ALT 18 10/18/2022 1532   ALKPHOS 71 10/18/2022 1532   BILITOT 0.3 10/18/2022 1532   BILITOT 0.6 06/04/2020 1002   GFRNONAA >60 09/24/2022 0720   GFRAA 96 09/25/2018 0801   Lab Results  Component Value Date   CHOL 197 08/13/2022   HDL 47 08/13/2022   LDLCALC 128 (H) 08/13/2022   LDLDIRECT 100.0 12/09/2014   TRIG 109 08/13/2022   CHOLHDL 4.2 08/13/2022   Lab Results  Component Value Date   HGBA1C 9.9 (H)  10/18/2022   Lab Results  Component Value Date   VITAMINB12 269 12/13/2018   Lab Results  Component Value Date   TSH 2.708 09/22/2022    CT head 5/22: No acute intracranial pathology Small vessel white matter disease CTA head & neck  Focal occlusion versus severe stenosis of the left vertebral artery Moderate right vertebral artery stenosis 40% stenosis right ICA MRI  brain Acute infarcts: Left pons, bilateral ophthalmic, bilateral parietal lobes Subacute infarct left frontal lobe white matter Chronic small vessel ischemic disease Chronic lacunar infarcts  TTE: 1. Technically difficult study with limited visualization of cardiac structures.   2. Left ventricular ejection fraction, by estimation, is 60 to 65%. The  left ventricle has normal function. The left ventricle has no regional  wall motion abnormalities. There is mild concentric left ventricular  hypertrophy. Left ventricular diastolic  function could not be evaluated.   3. Right ventricular systolic function is normal. The right ventricular  size is normal.   4. The mitral valve is normal in structure. No evidence of mitral valve  regurgitation. No evidence of mitral stenosis.   5. The aortic valve is normal in structure. Aortic valve regurgitation is  not visualized. No aortic stenosis is present.   6. The inferior vena cava is normal in size with greater than 50%  respiratory variability, suggesting right atrial pressure of 3 mmHg.    LDL 128 HgbA1c >15.5    ASSESSMENT AND PLAN  67 y.o. year old male here with:   Dx:  1. Cardioembolic stroke (HCC)   2. Type 2 diabetes mellitus with other neurologic complication, without long-term current use of insulin (HCC)   3. Paroxysmal atrial fibrillation (HCC)   4. Other hyperlipidemia     PLAN:  Multiple cardio-embolic infarcts (due to medication non-compliance / financial factors) --> bilateral acute infarcts left pons, bilateral thalami, bilateral  parietal lobes, subacute left frontal infarct, chronic lacunar infarcts Etiology:  Embolic secondary to anticoagulant medication non-compliance  Continue aspirin 325mg  daily, statin, DM and BP control in future consider to transition anticoagulation for atrial fibrillation (eliquis + aspirin 81mg  daily, because of co-existing CAD; or could consider coumadin alone)  Return for return to PCP, pending if symptoms worsen or fail to improve.    Suanne Marker, MD 10/25/2022, 10:06 AM Certified in Neurology, Neurophysiology and Neuroimaging  [facility] [address], [address] [place], [place] [address] [phone]

## 2022-10-29 ENCOUNTER — Telehealth: Payer: Self-pay | Admitting: Internal Medicine

## 2022-10-29 NOTE — Telephone Encounter (Signed)
Noreene Larsson from [place] called to report a fall from the patient. He fell last week(date unknown). He did not hit his head or report any injuries. Best callback for Noreene Larsson is [phone].

## 2022-10-31 NOTE — Telephone Encounter (Signed)
Noted! Thank you

## 2022-11-01 ENCOUNTER — Telehealth: Payer: Self-pay | Admitting: Internal Medicine

## 2022-11-01 NOTE — Telephone Encounter (Signed)
Zacharia Called from [place]  Effective today pt is okay to have Occupational therapy this week.  Best Called Back Number: [phone]

## 2022-11-03 NOTE — Telephone Encounter (Signed)
Okay thank you

## 2022-11-04 ENCOUNTER — Other Ambulatory Visit: Payer: Self-pay | Admitting: Internal Medicine

## 2022-11-05 ENCOUNTER — Encounter
Payer: Medicare Other | Attending: Physical Medicine and Rehabilitation | Admitting: Physical Medicine and Rehabilitation

## 2022-11-05 ENCOUNTER — Telehealth: Payer: Self-pay | Admitting: Internal Medicine

## 2022-11-05 ENCOUNTER — Encounter: Payer: Self-pay | Admitting: Physical Medicine and Rehabilitation

## 2022-11-05 VITALS — BP 163/80 | HR 64 | Ht 65.0 in | Wt 170.0 lb

## 2022-11-05 DIAGNOSIS — I10 Essential (primary) hypertension: Secondary | ICD-10-CM | POA: Diagnosis not present

## 2022-11-05 DIAGNOSIS — Z794 Long term (current) use of insulin: Secondary | ICD-10-CM

## 2022-11-05 DIAGNOSIS — M25561 Pain in right knee: Secondary | ICD-10-CM | POA: Diagnosis present

## 2022-11-05 DIAGNOSIS — R451 Restlessness and agitation: Secondary | ICD-10-CM | POA: Diagnosis present

## 2022-11-05 DIAGNOSIS — E669 Obesity, unspecified: Secondary | ICD-10-CM | POA: Diagnosis present

## 2022-11-05 DIAGNOSIS — G8929 Other chronic pain: Secondary | ICD-10-CM | POA: Insufficient documentation

## 2022-11-05 DIAGNOSIS — G4701 Insomnia due to medical condition: Secondary | ICD-10-CM | POA: Insufficient documentation

## 2022-11-05 DIAGNOSIS — E1169 Type 2 diabetes mellitus with other specified complication: Secondary | ICD-10-CM | POA: Diagnosis present

## 2022-11-05 DIAGNOSIS — E663 Overweight: Secondary | ICD-10-CM | POA: Diagnosis present

## 2022-11-05 MED ORDER — NIFEDIPINE 20 MG PO CAPS: 20.0000 mg | ORAL_CAPSULE | Freq: Every day | ORAL | 3 refills | Status: DC

## 2022-11-05 NOTE — Progress Notes (Signed)
Subjective:    Patient ID: Anthony Woods, male    DOB: 13-Apr-1955, 67 y.o.   MRN: [id number]  HPI  Mr. Hoard is a 67 year old man who presents for follow-up of CVA.   1) Overweight: -was 290 at one point, 170 today in the office  2) Right knee pain: -tylenol seems to help  3) HTN: -BP approaches 170 systolic at some times  4) Agitation: -he asks why he gets irritable with his wife  5) Type 2 diabetes: -CBGs are running 170s to 190s.   Pain Inventory Average Pain 5 Pain Right Now 8 My pain is constant and sharp  LOCATION OF PAIN  knee  BOWEL Number of stools per week: 7 Oral laxative use No  Type of laxative na Enema or suppository use No  History of colostomy No  Incontinent No   BLADDER Normal In and out cath, frequency na Able to self cath  na Bladder incontinence Yes  Frequent urination No  Leakage with coughing No  Difficulty starting stream No  Incomplete bladder emptying No    Mobility walk with assistance use a walker do you drive?  no use a wheelchair  Function not employed: date last employed 122023  Neuro/Psych bladder control problems  Prior Studies na  Physicians involved in your care Hospital follow up   Family History  Problem Relation Age of Onset   Diabetes Mother    Cancer Mother        male ca   Colon cancer Mother 60   Diabetes Father    Cancer Father        stomach   CVA Father    Stomach cancer Father    Breast cancer Sister    Diabetes Sister    CAD Maternal Grandmother    CVA Paternal Uncle    Esophageal cancer Neg Hx    Rectal cancer Neg Hx    Social History   Socioeconomic History   Marital status: Married    Spouse name: Not on file   Number of children: 1   Years of education: Not on file   Highest education level: Not on file  Occupational History   Occupation: [employer]   Occupation: GRAVE DIGGER    Employer: [employer]    Comment: retired  Tobacco Use   Smoking status:  Every Day    Current packs/day: 3.00    Average packs/day: 3.0 packs/day for 42.0 years (126.0 ttl pk-yrs)    Types: Cigarettes   Smokeless tobacco: Never   Tobacco comments:    Currently slightly less than 1 ppd  Vaping Use   Vaping status: Never Used  Substance and Sexual Activity   Alcohol use: Not Currently   Drug use: No   Sexual activity: Not Currently  Other Topics Concern   Not on file  Social History Narrative   Not on file   Social Determinants of Health   Financial Resource Strain: Not on file  Food Insecurity: No Food Insecurity (09/22/2022)   Hunger Vital Sign    Worried About Running Out of Food in the Last Year: Never true    Ran Out of Food in the Last Year: Never true  Transportation Needs: No Transportation Needs (09/22/2022)   PRAPARE - Administrator, Civil Service (Medical): No    Lack of Transportation (Non-Medical): No  Physical Activity: Not on file  Stress: Not on file  Social Connections: Not on file  Past Surgical History:  Procedure Laterality Date   CARDIAC CATHETERIZATION     COLONOSCOPY     CORONARY ANGIOPLASTY WITH STENT PLACEMENT  05/12/2016   CORONARY STENT INTERVENTION N/A 05/12/2016   Procedure: Coronary Stent Intervention;  Surgeon: Corky Crafts, MD;  Location: [facility] INVASIVE CV LAB;  Service: Cardiovascular;  Laterality: N/A;   CORONARY STENT INTERVENTION N/A 08/31/2017   Procedure: CORONARY STENT INTERVENTION;  Surgeon: Corky Crafts, MD;  Location: [facility] INVASIVE CV LAB;  Service: Cardiovascular;  Laterality: N/A;   GANGLION CYST EXCISION Left 1980s   KNEE ARTHROSCOPY Right    Meniscus removal   LEFT HEART CATH AND CORONARY ANGIOGRAPHY N/A 05/12/2016   Procedure: Left Heart Cath and Coronary Angiography;  Surgeon: Corky Crafts, MD;  Location: [facility] INVASIVE CV LAB;  Service: Cardiovascular;  Laterality: N/A;   LEFT HEART CATH AND CORONARY ANGIOGRAPHY N/A 08/31/2017   Procedure: LEFT HEART CATH AND CORONARY  ANGIOGRAPHY;  Surgeon: Corky Crafts, MD;  Location: [facility] INVASIVE CV LAB;  Service: Cardiovascular;  Laterality: N/A;   LEFT HEART CATHETERIZATION WITH CORONARY ANGIOGRAM N/A 02/04/2012   Procedure: LEFT HEART CATHETERIZATION WITH CORONARY ANGIOGRAM;  Surgeon: Kathleene Hazel, MD;  Location: [facility] CATH LAB;  Service: Cardiovascular;  Laterality: N/A;   POLYPECTOMY     RIGHT/LEFT HEART CATH AND CORONARY ANGIOGRAPHY N/A 09/28/2018   Procedure: RIGHT/LEFT HEART CATH AND CORONARY ANGIOGRAPHY;  Surgeon: Kathleene Hazel, MD;  Location: [facility] INVASIVE CV LAB;  Service: Cardiovascular;  Laterality: N/A;   Past Medical History:  Diagnosis Date   Allergy to bee sting    Anxiety    Arthritis    "right knee" (05/12/2016)   CAD (coronary artery disease)    a. DES to LCx 04/2016. b. DES to prox Cx 08/2017. c. Stable cath 09/2018 with nonobstructive residual disease, normal EF.   Carotid artery disease (HCC)    Headache    "when his sugar goes too high or too low" (05/12/2016)   HTN (hypertension)    Hyperlipidemia    Hypothyroidism    Macular degeneration    Obesity    PAF (paroxysmal atrial fibrillation) (HCC)    Type II diabetes mellitus (HCC)    BP (!) 163/80   Pulse 64   Ht 5\' 5"  (1.651 m)   Wt 170 lb (77.1 kg)   SpO2 96%   BMI 28.29 kg/m   Opioid Risk Score:   Fall Risk Score:  `1  Depression screen [facility] 2/9     11/05/2022   11:41 AM 10/18/2022    2:34 PM 10/04/2019    8:07 AM 12/20/2017    8:44 AM 10/06/2016    2:06 PM 09/07/2016    8:07 AM 06/04/2016    3:15 PM  Depression screen PHQ 2/9  Decreased Interest 3 0 0 0 0 0 0  Down, Depressed, Hopeless 3 0 0 0 0 0 0  PHQ - 2 Score 6 0 0 0 0 0 0  Altered sleeping 1        Tired, decreased energy 1        Change in appetite 0        Feeling bad or failure about yourself  3        Trouble concentrating 0        Moving slowly or fidgety/restless 0        Suicidal thoughts 0        PHQ-9 Score 11  Review of  Systems  Musculoskeletal:        RT knee  All other systems reviewed and are negative.      Objective:   Physical Exam Gen: no distress, normal appearing HEENT: oral mucosa pink and moist, NCAT Cardio: Reg rate Chest: normal effort, normal rate of breathing Abd: soft, non-distended Ext: no edema Psych: pleasant, normal affect Skin: intact Neuro: Alert and orientex     Assessment & Plan:   1) HTN -discussed that wife makes spaghetti, meat, and potatoes for him -Advised checking BP daily at home and logging results to bring into follow-up appointment with PCP and myself. -Reviewed BP meds today.  -Advised regarding healthy foods that can help lower blood pressure and provided with a list: 1) citrus foods- high in vitamins and minerals 2) salmon and other fatty fish - reduces inflammation and oxylipins 3) swiss chard (leafy green)- high level of nitrates 4) pumpkin seeds- one of the best natural sources of magnesium 5) Beans and lentils- high in fiber, magnesium, and potassium 6) Berries- high in flavonoids 7) Amaranth (whole grain, can be cooked similarly to rice and oats)- high in magnesium and fiber 8) Pistachios- even more effective at reducing BP than other nuts 9) Carrots- high in phenolic compounds that relax blood vessels and reduce inflammation 10) Celery- contain phthalides that relax tissues of arterial walls 11) Tomatoes- can also improve cholesterol and reduce risk of heart disease 12) Broccoli- good source of magnesium, calcium, and potassium 13) Greek yogurt: high in potassium and calcium 14) Herbs and spices: Celery seed, cilantro, saffron, lemongrass, black cumin, ginseng, cinnamon, cardamom, sweet basil, and ginger 15) Chia and flax seeds- also help to lower cholesterol and blood sugar 16) Beets- high levels of nitrates that relax blood vessels  17) spinach and bananas- high in potassium  -Provided lise of supplements that can help with hypertension:  1)  magnesium: one high quality brand is Bioptemizers since it contains all 7 types of magnesium, otherwise over the counter magnesium gluconate 400mg  is a good option 2) B vitamins 3) vitamin D 4) potassium 5) CoQ10 6) L-arginine 7) Vitamin C 8) Beetroot -Educated that goal BP is 120/80. -Made goal to incorporate some of the above foods into diet.    2) Overweight:  -discussed his weight loss with Ozempic -switch from canola oil to olive oil  3) Right knee pain: -continue tylenol  4) Type 2 diabetes: -ranging from 170 to 190s -continue acarbose 50mg  TID  5) Agitation:  -magnesium gluconate 250mg  HS  6) Insomnia:  -magnesium gluconate 250mg  HS prescribed

## 2022-11-05 NOTE — Telephone Encounter (Signed)
Called Vernona Rieger back no answer LMOM w/MD response

## 2022-11-05 NOTE — Telephone Encounter (Signed)
Vernona Rieger called from [facility] wanting verbal orders for physical therapy once week for 8 weeks.  Best call back [phone]

## 2022-11-08 NOTE — Telephone Encounter (Signed)
OK. Thx

## 2022-11-12 ENCOUNTER — Other Ambulatory Visit: Payer: Self-pay | Admitting: Physician Assistant

## 2022-11-12 ENCOUNTER — Ambulatory Visit
Admission: EM | Admit: 2022-11-12 | Discharge: 2022-11-12 | Disposition: A | Discharge status: Home / Self Care | Payer: Medicare Other | Attending: Internal Medicine | Admitting: Internal Medicine

## 2022-11-12 DIAGNOSIS — T63461A Toxic effect of venom of wasps, accidental (unintentional), initial encounter: Secondary | ICD-10-CM | POA: Diagnosis not present

## 2022-11-12 DIAGNOSIS — T7840XA Allergy, unspecified, initial encounter: Secondary | ICD-10-CM

## 2022-11-12 MED ORDER — DEXAMETHASONE SODIUM PHOSPHATE 10 MG/ML IJ SOLN
10.0000 mg | Freq: Once | INTRAMUSCULAR | Status: AC
  Administered 2022-11-12: 10 mg via INTRAMUSCULAR

## 2022-11-12 MED ORDER — EPINEPHRINE 0.3 MG/0.3ML IJ SOAJ: 0.3000 mg | INTRAMUSCULAR | 0 refills | Status: DC | PRN

## 2022-11-12 MED ORDER — DIPHENHYDRAMINE HCL 50 MG/ML IJ SOLN
25.0000 mg | Freq: Once | INTRAMUSCULAR | Status: AC
  Administered 2022-11-12: 25 mg via INTRAMUSCULAR

## 2022-11-12 NOTE — ED Provider Notes (Signed)
[facility]    CSN: [id number] Arrival date & time: 11/12/22  1901      History   Chief Complaint Chief Complaint  Patient presents with   Insect Bite    HPI Anthony Woods is a 67 y.o. male.   Patient presents with possible wasp sting to right hand that occurred directly prior to arrival to urgent care.  Reports that he is very allergic to any type of sting. denies any feelings of throat closing or shortness of breath.  He has not taken any medications for symptoms.     Past Medical History:  Diagnosis Date   Allergy to bee sting    Anxiety    Arthritis    "right knee" (05/12/2016)   CAD (coronary artery disease)    a. DES to LCx 04/2016. b. DES to prox Cx 08/2017. c. Stable cath 09/2018 with nonobstructive residual disease, normal EF.   Carotid artery disease (HCC)    Headache    "when his sugar goes too high or too low" (05/12/2016)   HTN (hypertension)    Hyperlipidemia    Hypothyroidism    Macular degeneration    Obesity    PAF (paroxysmal atrial fibrillation) (HCC)    Type II diabetes mellitus (HCC)     Patient Active Problem List   Diagnosis Date Noted   Confusion 10/18/2022   Mild neurocognitive disorder due to another medical condition 08/25/2022   Acute embolic stroke (HCC) 08/13/2022   History of CVA (cerebrovascular accident) 08/12/2022   Dyslipidemia 08/12/2022   Noncompliance with medication regimen 08/12/2022   DNR (do not resuscitate) 08/12/2022   Knee strain, right, initial encounter 08/27/2020   Carotid bruit 05/22/2020   COPD (chronic obstructive pulmonary disease) (HCC) 05/22/2020   Neoplasm of uncertain behavior of skin 01/10/2020   Right knee pain 05/01/2019   DOE (dyspnea on exertion)    Chest pain 09/08/2018   Wrist pain, acute, left 04/24/2018   Angina pectoris, unspecified (HCC)    Stress at work 12/15/2016   Tobacco dependence 06/04/2016   Atrial fibrillation with RVR (HCC) 05/12/2016   Elevated troponin    Atrial  fibrillation (HCC) 05/11/2016   Allergic rhinitis 08/12/2015   Chest pain with moderate risk of acute coronary syndrome 04/14/2015   Urinary frequency 04/14/2015   Osteoarthritis of right knee 12/09/2014   Snoring 09/25/2013   Rash and nonspecific skin eruption 08/08/2012   Insomnia 08/08/2012   Class 1 obesity due to excess calories with body mass index (BMI) of 34.0 to 34.9 in adult 06/10/2011   Well adult exam 06/10/2011   Cough due to angiotensin-converting enzyme inhibitor 10/08/2010   SHOULDER PAIN 03/09/2010   NECK PAIN 03/09/2010   BRONCHITIS, ACUTE 12/13/2007   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   Hypothyroidism 04/04/2007   Diabetes mellitus type 2 in obese 04/04/2007   Essential hypertension 04/04/2007   Coronary atherosclerosis 04/04/2007   Edema 04/04/2007    Past Surgical History:  Procedure Laterality Date   CARDIAC CATHETERIZATION     COLONOSCOPY     CORONARY ANGIOPLASTY WITH STENT PLACEMENT  05/12/2016   CORONARY STENT INTERVENTION N/A 05/12/2016   Procedure: Coronary Stent Intervention;  Surgeon: Corky Crafts, MD;  Location: [facility] INVASIVE CV LAB;  Service: Cardiovascular;  Laterality: N/A;   CORONARY STENT INTERVENTION N/A 08/31/2017   Procedure: CORONARY STENT INTERVENTION;  Surgeon: Corky Crafts, MD;  Location: [facility] INVASIVE CV LAB;  Service: Cardiovascular;  Laterality: N/A;   GANGLION CYST EXCISION Left  1980s   KNEE ARTHROSCOPY Right    Meniscus removal   LEFT HEART CATH AND CORONARY ANGIOGRAPHY N/A 05/12/2016   Procedure: Left Heart Cath and Coronary Angiography;  Surgeon: Corky Crafts, MD;  Location: [facility] INVASIVE CV LAB;  Service: Cardiovascular;  Laterality: N/A;   LEFT HEART CATH AND CORONARY ANGIOGRAPHY N/A 08/31/2017   Procedure: LEFT HEART CATH AND CORONARY ANGIOGRAPHY;  Surgeon: Corky Crafts, MD;  Location: [facility] INVASIVE CV LAB;  Service: Cardiovascular;  Laterality: N/A;   LEFT HEART CATHETERIZATION WITH CORONARY ANGIOGRAM  N/A 02/04/2012   Procedure: LEFT HEART CATHETERIZATION WITH CORONARY ANGIOGRAM;  Surgeon: Kathleene Hazel, MD;  Location: [facility] CATH LAB;  Service: Cardiovascular;  Laterality: N/A;   POLYPECTOMY     RIGHT/LEFT HEART CATH AND CORONARY ANGIOGRAPHY N/A 09/28/2018   Procedure: RIGHT/LEFT HEART CATH AND CORONARY ANGIOGRAPHY;  Surgeon: Kathleene Hazel, MD;  Location: [facility] INVASIVE CV LAB;  Service: Cardiovascular;  Laterality: N/A;       Home Medications    Prior to Admission medications   Medication Sig Start Date End Date Taking? Authorizing Provider  acarbose (PRECOSE) 50 MG tablet Take 1 tablet (50 mg total) by mouth 3 (three) times daily with meals. Annual appt due in Oct must see provider for future refills 11/04/22  Yes Plotnikov, Georgina Quint, MD  amiodarone (PACERONE) 200 MG tablet Take 1 tablet (200 mg total) by mouth daily. Annual appt due in Oct must see provider for future refills 11/04/22  Yes Plotnikov, Georgina Quint, MD  aspirin EC 81 MG tablet Take 1 tablet (81 mg total) by mouth daily for 30 days then as directed by MD .Swallow whole. Patient taking differently: Take 325 mg by mouth daily. 10/05/22  Yes Raulkar, Drema Pry, MD  Continuous Glucose Receiver (FREESTYLE LIBRE 3 READER) DEVI 1 each by Does not apply route daily. 09/01/22  Yes Raulkar, Drema Pry, MD  Continuous Glucose Sensor (FREESTYLE LIBRE 3 SENSOR) MISC 1 each by Does not apply route daily. Place 1 sensor on the skin every 14 days. Use to check glucose continuously 09/01/22  Yes Raulkar, Drema Pry, MD  Cyanocobalamin (B-12) 500 MCG TABS TAKE 1 TABLET (500 MCG TOTAL) BY MOUTH DAILY. Patient taking differently: Take 1 tablet by mouth daily. 02/16/21  Yes Plotnikov, Georgina Quint, MD  diphenhydrAMINE (BENADRYL) 25 mg capsule Take 25 mg by mouth as needed (bee stings).   Yes [provider]  EPINEPHrine (EPIPEN 2-PAK) 0.3 mg/0.3 mL IJ SOAJ injection Inject 0.3 mg into the muscle as needed for anaphylaxis. 12/29/20  Yes  Plotnikov, Georgina Quint, MD  EPINEPHrine 0.3 mg/0.3 mL IJ SOAJ injection Inject 0.3 mg into the muscle as needed for anaphylaxis. 11/12/22  Yes , Rolly Salter E, FNP  ezetimibe (ZETIA) 10 MG tablet Take 1 tablet (10 mg total) by mouth daily. 10/05/22  Yes Raulkar, Drema Pry, MD  furosemide (LASIX) 20 MG tablet Take 1 tablet (20 mg total) by mouth daily as needed. 10/05/22  Yes Raulkar, Drema Pry, MD  levothyroxine (SYNTHROID) 175 MCG tablet TAKE 1 TABLET BY MOUTH DAILY AT 6 AM. 11/04/22  Yes Plotnikov, Georgina Quint, MD  melatonin 3 MG TABS tablet Take 1 tablet (3 mg total) by mouth at bedtime. 09/03/22  Yes Setzer, Lynnell Jude, PA-C  metFORMIN (GLUCOPHAGE) 1000 MG tablet Take 1 tablet (1,000 mg total) by mouth 2 (two) times daily with a meal. 10/05/22  Yes Raulkar, Drema Pry, MD  metoprolol succinate (TOPROL-XL) 25 MG 24 hr tablet Take 0.5 tablets (  12.5 mg total) by mouth daily. 10/05/22  Yes Raulkar, Drema Pry, MD  NIFEdipine (PROCARDIA) 20 MG capsule Take 1 capsule (20 mg total) by mouth daily. 11/05/22  Yes Raulkar, Drema Pry, MD  QUEtiapine (SEROQUEL) 50 MG tablet Take 1 tablet (50 mg total) by mouth at bedtime. Annual appt due in Oct must see provider for future refills 11/04/22  Yes Plotnikov, Georgina Quint, MD  repaglinide (PRANDIN) 1 MG tablet Take 1 tablet (1 mg total) by mouth 3 (three) times daily before meals. 10/18/22  Yes Plotnikov, Georgina Quint, MD  rosuvastatin (CRESTOR) 20 MG tablet Take 1 tablet (20 mg total) by mouth daily. 10/05/22  Yes Raulkar, Drema Pry, MD  rosuvastatin (CRESTOR) 40 MG tablet TAKE 1 TABLET BY MOUTH DAILY. 11/12/22  Yes Kathleene Hazel, MD  traZODone (DESYREL) 150 MG tablet TAKE 1/2 TABLET (75 MG TOTAL) BY MOUTH AT BEDTIME. 11/04/22  Yes Plotnikov, Georgina Quint, MD  acetaminophen (TYLENOL) 325 MG tablet Take 1-2 tablets (325-650 mg total) by mouth every 6 (six) hours as needed for moderate pain or mild pain. 09/03/22   Setzer, Lynnell Jude, PA-C  nitroGLYCERIN (NITROSTAT) 0.4 MG SL tablet  Place 1 tablet (0.4 mg total) under the tongue every 5 (five) minutes as needed for chest pain. 12/29/20   Plotnikov, Georgina Quint, MD    Family History Family History  Problem Relation Age of Onset   Diabetes Mother    Cancer Mother        male ca   Colon cancer Mother 97   Diabetes Father    Cancer Father        stomach   CVA Father    Stomach cancer Father    Breast cancer Sister    Diabetes Sister    CAD Maternal Grandmother    CVA Paternal Uncle    Esophageal cancer Neg Hx    Rectal cancer Neg Hx     Social History Social History   Tobacco Use   Smoking status: Every Day    Current packs/day: 3.00    Average packs/day: 3.0 packs/day for 42.0 years (126.0 ttl pk-yrs)    Types: Cigarettes   Smokeless tobacco: Never   Tobacco comments:    Currently slightly less than 1 ppd  Vaping Use   Vaping status: Never Used  Substance Use Topics   Alcohol use: Not Currently   Drug use: No     Allergies   Bee venom, Actos [pioglitazone hydrochloride], Lipitor [atorvastatin], and Lisinopril   Review of Systems Review of Systems Per HPI  Physical Exam Triage Vital Signs ED Triage Vitals  Encounter Vitals Group     BP 11/12/22 1916 (!) 179/79     Systolic BP Percentile --      Diastolic BP Percentile --      Pulse Rate 11/12/22 1914 77     Resp 11/12/22 1916 16     Temp 11/12/22 1914 98.1 F (36.7 C)     Temp Source 11/12/22 1914 Oral     SpO2 11/12/22 1916 94 %     Weight --      Height --      Head Circumference --      Peak Flow --      Pain Score 11/12/22 2001 0     Pain Loc --      Pain Education --      Exclude from Growth Chart --    No data found.  Updated Vital Signs BP (!) 179/79 (  BP Location: Left Arm)   Pulse 77   Temp 98.1 F (36.7 C) (Oral)   Resp 16   SpO2 94%   Visual Acuity Right Eye Distance:   Left Eye Distance:   Bilateral Distance:    Right Eye Near:   Left Eye Near:    Bilateral Near:     Physical Exam Constitutional:       General: He is not in acute distress.    Appearance: Normal appearance. He is not toxic-appearing or diaphoretic.  HENT:     Head: Normocephalic and atraumatic.     Mouth/Throat:     Mouth: Mucous membranes are moist.     Pharynx: No pharyngeal swelling.  Eyes:     Extraocular Movements: Extraocular movements intact.     Conjunctiva/sclera: Conjunctivae normal.  Cardiovascular:     Rate and Rhythm: Normal rate and regular rhythm.     Pulses: Normal pulses.     Heart sounds: Normal heart sounds.  Pulmonary:     Effort: Pulmonary effort is normal. No respiratory distress.     Breath sounds: Normal breath sounds. No stridor. No wheezing, rhonchi or rales.  Skin:    Comments: Has very mild swelling and erythema present to dorsal surface of right hand between first and second digit.  Neurological:     General: No focal deficit present.     Mental Status: He is alert and oriented to person, place, and time. Mental status is at baseline.  Psychiatric:        Mood and Affect: Mood normal.        Behavior: Behavior normal.        Thought Content: Thought content normal.        Judgment: Judgment normal.      UC Treatments / Results  Labs (all labs ordered are listed, but only abnormal results are displayed) Labs Reviewed - No data to display  EKG   Radiology No results found.  Procedures Procedures (including critical care time)  Medications Ordered in UC Medications  dexamethasone (DECADRON) injection 10 mg (10 mg Intramuscular Given 11/12/22 1935)  diphenhydrAMINE (BENADRYL) injection 25 mg (25 mg Intramuscular Given 11/12/22 1936)    Initial Impression / Assessment and Plan / UC Course  I have reviewed the triage vital signs and the nursing notes.  Pertinent labs & imaging results that were available during my care of the patient were reviewed by me and considered in my medical decision making (see chart for details).     It appears the patient is simply only  having a localized allergic reaction to the hand.  There are no obvious signs of anaphylaxis on exam but given patient's significant history of anaphylaxis, will treat with IM Decadron and IM Benadryl today.  Patient was monitored for a time here in urgent care with no significant shortness of breath, wheezing, stridor, feelings of throat closing.  Therefore, do not think that epinephrine administration or emergent evaluation is necessary.  Patient is not sure if he has an EpiPen at home so will send this for patient to have available as needed.  Advised strict return and ER precautions.  Patient verbalized understanding and was agreeable with plan.  Oxygen rechecked and was sustaining at 95%.  Blood pressure slightly elevated but after further review of the chart, this appears baseline for patient per recent PCP note.  Patient to monitor blood pressure at home and follow-up with PCP or urgent care if it remains elevated.  Also advised  patient to monitoring blood glucose at home over the next 24 to 48 hours given steroid injection.  Although, I do think that benefits outweigh risks of steroid to prevent anaphylaxis. Final Clinical Impressions(s) / UC Diagnoses   Final diagnoses:  None   Discharge Instructions   None    ED Prescriptions     Medication Sig Dispense Auth. Provider   EPINEPHrine 0.3 mg/0.3 mL IJ SOAJ injection Inject 0.3 mg into the muscle as needed for anaphylaxis. 1 each Gustavus Bryant, [place]      PDMP not reviewed this encounter.   Gustavus Bryant, [place] 11/12/22 2006

## 2022-11-12 NOTE — ED Triage Notes (Signed)
Pt presents with insect bite to his right hand. Denies any SOB.

## 2022-12-08 NOTE — Progress Notes (Signed)
Cardiology Office Note    Patient Name: Anthony Woods Date of Encounter: 12/08/2022  Primary Care Provider:  Tresa Garter, MD Primary Cardiologist:  Anthony Carrow, MD Primary Electrophysiologist: None   Past Medical History    Past Medical History:  Diagnosis Date   Allergy to bee sting    Anxiety    Arthritis    "right knee" (05/12/2016)   CAD (coronary artery disease)    a. DES to LCx 04/2016. b. DES to prox Cx 08/2017. c. Stable cath 09/2018 with nonobstructive residual disease, normal EF.   Carotid artery disease (HCC)    Headache    "when his sugar goes too high or too low" (05/12/2016)   HTN (hypertension)    Hyperlipidemia    Hypothyroidism    Macular degeneration    Obesity    PAF (paroxysmal atrial fibrillation) (HCC)    Type II diabetes mellitus (HCC)     History of Present Illness  Anthony Woods is a 67 y.o. male with a PMH of CAD s/p DES to left circumflex 2019 with DES to proximal circumflex in 2019, HTN, HLD, PAF, embolic CVA 07/2022, DM type II, hypothyroidism, osteoarthritis tobacco abuse who presents for posthospital follow-up.  Anthony Woods was seen initially by Anthony Woods in 2013 with complaint of chest pain and shortness of breath.  He had a previous diagnosis of mild CAD in 2003 and underwent outpatient LHC on 01/2012 that showed minor nonobstructive CAD.  He was admitted to the ED on 04/2016 with complaint of chest pain and headache with shortness of breath.  He was found to be in new onset AF and treated with Cardizem drip.  Troponins were mildly elevated and patient underwent left heart cath that showed 75% stenosis in circumflex treated with DES x 1.  Echo February 2018 with LVEF=50-55%. He presented again in 2019 with complaint of chest pain and underwent left heart cath with 80% stenosis in proximal to mid left circumflex treated with PCI/DES x 1.  He was placed on triple therapy with ASA, Plavix and Xarelto.  He was seen 08/2018 with chest pain  and underwent LHC in 09/2018 that showed mild LAD and RCA disease with patent circumflex stents.  He was diagnosed with embolic stroke secondary to medication noncompliance.  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  MRI of the brain showed acute infarcts in the left pons and bilateral ophthalmic, bilateral parietal lobes with subacute infarct in the left frontal lobe with chronic lacunar infarcts.  He underwent a TEE that showed EF of 60 to 65% with no RWMA and no evidence of valvular abnormalities.  He was seen in the ED via EMS on 09/22/2022 with complaint of chest pain and tachycardia.  He was found to be in AF with RVR and underwent DCCV x 2 which was unsuccessful.  He was started on amiodarone drip with mild improvement to heart rate in the 120s with eventual conversion to sinus rhythm.  He was discharged with p.o. amiodarone and was started on Eliquis.  He was seen by his PCP on 10/18/2022 and was no longer on Eliquis due to cost and started on ASA 325 mg with discussion regarding Coumadin.  He is currently completing physical therapy.  During today's visit the patient reports that he has been doing well with no palpitations or bouts of atrial fibrillation.  His blood pressure today was 164/80 and was unfortunately not  rechecked prior to the end of today's visit.  He reports compliance with his current medications and denies any adverse reactions.  He is continuing to build his strength and is working with PT/OT at home.  His wife reports that he sits on the porch and walks around the house in the yard without any difficulty.  We discussed options for affordability regarding Eliquis and patient was provided assistance paperwork today.  If approved patient would like to start Eliquis in the future.  Patient denies chest pain, palpitations, dyspnea, PND, orthopnea, nausea, vomiting, dizziness, syncope, edema, weight gain, or early  satiety.   Review of Systems  Please see the history of present illness.    All other systems reviewed and are otherwise negative except as noted above.  Physical Exam    Wt Readings from Last 3 Encounters:  11/05/22 170 lb (77.1 kg)  10/25/22 167 lb 9.6 oz (76 kg)  10/18/22 170 lb (77.1 kg)   WG:NFAOZ were no vitals filed for this visit.,There is no height or weight on file to calculate BMI. GEN: Well nourished, well developed in no acute distress Neck: No JVD; No carotid bruits Pulmonary: Clear to auscultation without rales, wheezing or rhonchi  Cardiovascular: Normal rate. Regular rhythm. Normal S1. Normal S2.   Murmurs: There is no murmur.  ABDOMEN: Soft, non-tender, non-distended EXTREMITIES:  No edema; No deformity   EKG/LABS/ Recent Cardiac Studies   ECG personally reviewed by me today -none completed today  Risk Assessment/Calculations:    CHA2DS2-VASc Score = 6   This indicates a 9.7% annual risk of stroke. The patient's score is based upon: CHF History: 0 HTN History: 1 Diabetes History: 1 Stroke History: 2 Vascular Disease History: 1 Age Score: 1 Gender Score: 0         Lab Results  Component Value Date   WBC 7.3 10/18/2022   HGB 12.9 (L) 10/18/2022   HCT 38.6 (L) 10/18/2022   MCV 92.9 10/18/2022   PLT 191.0 10/18/2022   Lab Results  Component Value Date   CREATININE 0.79 10/18/2022   BUN 13 10/18/2022   NA 139 10/18/2022   K 3.9 10/18/2022   CL 100 10/18/2022   CO2 31 10/18/2022   Lab Results  Component Value Date   CHOL 197 08/13/2022   HDL 47 08/13/2022   LDLCALC 128 (H) 08/13/2022   LDLDIRECT 100.0 12/09/2014   TRIG 109 08/13/2022   CHOLHDL 4.2 08/13/2022    Lab Results  Component Value Date   HGBA1C 9.9 (H) 10/18/2022   Assessment & Plan    1.  Coronary artery disease: -s/p LHC with PCI to circumflex 04/2016, DES to proximal circumflex 08/2017 with most recent LHC completed 09/2018 showing patent stents -Today patient reports no  chest pain or anginal equivalent. -Continue GDMT with Crestor 40 mg daily, Toprol-XL 12.5 mg daily, ASA 81 mg daily, ezetimibe 10 mg daily, as needed Nitrostat 0.4 mg  2.  PAF: -Patient currently on amiodarone 20 mg with rate currently controlled at 71 bpm. -He is currently not on anticoagulation due to affordability concerns. -Patient was provided assistance paperwork for Eliquis and we will plan to reinitiate if approved in the future. -CHA2DS2-VASc Score = 6 [CHF History: 0, HTN History: 1, Diabetes History: 1, Stroke History: 2, Vascular Disease History: 1, Age Score: 1, Gender Score: 0].  Therefore, the patient's annual risk of stroke is 9.7 %.     -Continue rate control with amiodarone 200 mg and Toprol-XL 12.5 mg -Will  complete surveillance labs with TSH, CMET  3.  History of CVA: -s/p embolic stroke 07/2022 secondary to medication noncompliance -Today patient is doing well and currently working with PT/OT -Patient is interested in resuming Eliquis and was provided assistance paperwork today. -Continue ASA 81 mg  4.  Essential hypertension: -Patient's blood pressure is elevated at 164/80 -We will plan to increase patient's Toprol and add additional medication at follow-up if remaining elevated.  5.  Tobacco abuse: -Smoking cessation encouraged  6.  Hyperlipidemia: -Patient's last LDL cholesterol was 128 -Continue Crestor 40 mg daily  Disposition: Follow-up with Anthony Carrow, MD or APP in 3 months    Signed, Napoleon Form, Leodis Rains, NP 12/08/2022, 9:19 PM [facility]

## 2022-12-10 ENCOUNTER — Encounter: Payer: Self-pay | Admitting: Nurse Practitioner

## 2022-12-10 ENCOUNTER — Ambulatory Visit: Payer: Medicare Other | Attending: Nurse Practitioner | Admitting: Nurse Practitioner

## 2022-12-10 VITALS — BP 164/80 | HR 71 | Ht 65.0 in | Wt 173.4 lb

## 2022-12-10 DIAGNOSIS — I10 Essential (primary) hypertension: Secondary | ICD-10-CM | POA: Diagnosis present

## 2022-12-10 DIAGNOSIS — I480 Paroxysmal atrial fibrillation: Secondary | ICD-10-CM | POA: Diagnosis present

## 2022-12-10 DIAGNOSIS — I2510 Atherosclerotic heart disease of native coronary artery without angina pectoris: Secondary | ICD-10-CM | POA: Diagnosis present

## 2022-12-10 DIAGNOSIS — E785 Hyperlipidemia, unspecified: Secondary | ICD-10-CM

## 2022-12-10 DIAGNOSIS — Z8673 Personal history of transient ischemic attack (TIA), and cerebral infarction without residual deficits: Secondary | ICD-10-CM

## 2022-12-10 NOTE — Patient Instructions (Signed)
Medication Instructions:  Your physician recommends that you continue on your current medications as directed. Please refer to the Current Medication list given to you today.  *If you need a refill on your cardiac medications before your next appointment, please call your pharmacy*   Lab Work: CMET and TSH today.  If you have labs (blood work) drawn today and your tests are completely normal, you will receive your results only by: MyChart Message (if you have MyChart) OR A paper copy in the mail If you have any lab test that is abnormal or we need to change your treatment, we will call you to review the results.   Testing/Procedures: None   Follow-Up: At [facility], you and your health needs are our priority.  As part of our continuing mission to provide you with exceptional heart care, we have created designated Provider Care Teams.  These Care Teams include your primary Cardiologist (physician) and Advanced Practice Providers (APPs -  Physician Assistants and Nurse Practitioners) who all work together to provide you with the care you need, when you need it.  We recommend signing up for the patient portal called "MyChart".  Sign up information is provided on this After Visit Summary.  MyChart is used to connect with patients for Virtual Visits (Telemedicine).  Patients are able to view lab/test results, encounter notes, upcoming appointments, etc.  Non-urgent messages can be sent to your provider as well.   To learn more about what you can do with MyChart, go to [email or web address].    Your next appointment:   3 month(s)  Provider:   Verne Carrow, MD  or Robin Searing, NP         Other Instructions PLEASE CONTINUE TO ELEVATE LEGS.   Low-Sodium Eating Plan Sodium, which is an element that makes up salt, helps you maintain a healthy balance of fluids in your body. Too much sodium can increase your blood pressure and cause fluid and waste to be held in your  body. Your health care provider or dietitian may recommend following this plan if you have high blood pressure (hypertension), kidney disease, liver disease, or heart failure. Eating less sodium can help lower your blood pressure, reduce swelling, and protect your heart, liver, and kidneys. What are tips for following this plan? General guidelines Most people on this plan should limit their sodium intake to 1,500-2,000 mg (milligrams) of sodium each day. Reading food labels  The Nutrition Facts label lists the amount of sodium in one serving of the food. If you eat more than one serving, you must multiply the listed amount of sodium by the number of servings. Choose foods with less than 140 mg of sodium per serving. Avoid foods with 300 mg of sodium or more per serving. Shopping Look for lower-sodium products, often labeled as "low-sodium" or "no salt added." Always check the sodium content even if foods are labeled as "unsalted" or "no salt added". Buy fresh foods. Avoid canned foods and premade or frozen meals. Avoid canned, cured, or processed meats Buy breads that have less than 80 mg of sodium per slice. Cooking Eat more home-cooked food and less restaurant, buffet, and fast food. Avoid adding salt when cooking. Use salt-free seasonings or herbs instead of table salt or sea salt. Check with your health care provider or pharmacist before using salt substitutes. Cook with plant-based oils, such as canola, sunflower, or olive oil. Meal planning When eating at a restaurant, ask that your food be prepared with less salt  or no salt, if possible. Avoid foods that contain MSG (monosodium glutamate). MSG is sometimes added to [place] food, bouillon, and some canned foods. What foods are recommended? The items listed may not be a complete list. Talk with your dietitian about what dietary choices are best for you. Grains Low-sodium cereals, including oats, puffed wheat and rice, and shredded  wheat. Low-sodium crackers. Unsalted rice. Unsalted pasta. Low-sodium bread. Whole-grain breads and whole-grain pasta. Vegetables Fresh or frozen vegetables. "No salt added" canned vegetables. "No salt added" tomato sauce and paste. Low-sodium or reduced-sodium tomato and vegetable juice. Fruits Fresh, frozen, or canned fruit. Fruit juice. Meats and other protein foods Fresh or frozen (no salt added) meat, poultry, seafood, and fish. Low-sodium canned tuna and salmon. Unsalted nuts. Dried peas, beans, and lentils without added salt. Unsalted canned beans. Eggs. Unsalted nut butters. Dairy Milk. Soy milk. Cheese that is naturally low in sodium, such as ricotta cheese, fresh mozzarella, or Swiss cheese Low-sodium or reduced-sodium cheese. Cream cheese. Yogurt. Fats and oils Unsalted butter. Unsalted margarine with no trans fat. Vegetable oils such as canola or olive oils. Seasonings and other foods Fresh and dried herbs and spices. Salt-free seasonings. Low-sodium mustard and ketchup. Sodium-free salad dressing. Sodium-free light mayonnaise. Fresh or refrigerated horseradish. Lemon juice. Vinegar. Homemade, reduced-sodium, or low-sodium soups. Unsalted popcorn and pretzels. Low-salt or salt-free chips. What foods are not recommended? The items listed may not be a complete list. Talk with your dietitian about what dietary choices are best for you. Grains Instant hot cereals. Bread stuffing, pancake, and biscuit mixes. Croutons. Seasoned rice or pasta mixes. Noodle soup cups. Boxed or frozen macaroni and cheese. Regular salted crackers. Self-rising flour. Vegetables Sauerkraut, pickled vegetables, and relishes. Olives. [place] fries. Onion rings. Regular canned vegetables (not low-sodium or reduced-sodium). Regular canned tomato sauce and paste (not low-sodium or reduced-sodium). Regular tomato and vegetable juice (not low-sodium or reduced-sodium). Frozen vegetables in sauces. Meats and other protein  foods Meat or fish that is salted, canned, smoked, spiced, or pickled. Bacon, ham, sausage, hotdogs, corned beef, chipped beef, packaged lunch meats, salt pork, jerky, pickled herring, anchovies, regular canned tuna, sardines, salted nuts. Dairy Processed cheese and cheese spreads. Cheese curds. Blue cheese. Feta cheese. String cheese. Regular cottage cheese. Buttermilk. Canned milk. Fats and oils Salted butter. Regular margarine. Ghee. Bacon fat. Seasonings and other foods Onion salt, garlic salt, seasoned salt, table salt, and sea salt. Canned and packaged gravies. Worcestershire sauce. Tartar sauce. Barbecue sauce. Teriyaki sauce. Soy sauce, including reduced-sodium. Steak sauce. Fish sauce. Oyster sauce. Cocktail sauce. Horseradish that you find on the shelf. Regular ketchup and mustard. Meat flavorings and tenderizers. Bouillon cubes. Hot sauce and Tabasco sauce. Premade or packaged marinades. Premade or packaged taco seasonings. Relishes. Regular salad dressings. Salsa. Potato and tortilla chips. Corn chips and puffs. Salted popcorn and pretzels. Canned or dried soups. Pizza. Frozen entrees and pot pies. Summary Eating less sodium can help lower your blood pressure, reduce swelling, and protect your heart, liver, and kidneys. Most people on this plan should limit their sodium intake to 1,500-2,000 mg (milligrams) of sodium each day. Canned, boxed, and frozen foods are high in sodium. Restaurant foods, fast foods, and pizza are also very high in sodium. You also get sodium by adding salt to food. Try to cook at home, eat more fresh fruits and vegetables, and eat less fast food, canned, processed, or prepared foods. This information is not intended to replace advice given to you by your health care  provider. Make sure you discuss any questions you have with your health care provider. Document Revised: 02/18/2017 Document Reviewed: 03/01/2016 Elsevier Patient Education  2020 [employer]    Heart-Healthy Eating Plan Many factors influence your heart health, including eating and exercise habits. Heart health is also called coronary health. Coronary risk increases with abnormal blood fat (lipid) levels. A heart-healthy eating plan includes limiting unhealthy fats, increasing healthy fats, limiting salt (sodium) intake, and making other diet and lifestyle changes. What is my plan? Your health care provider may recommend that: You limit your fat intake to _________% or less of your total calories each day. You limit your saturated fat intake to _________% or less of your total calories each day. You limit the amount of cholesterol in your diet to less than _________ mg per day. You limit the amount of sodium in your diet to less than _________ mg per day. What are tips for following this plan? Cooking Cook foods using methods other than frying. Baking, boiling, grilling, and broiling are all good options. Other ways to reduce fat include: Removing the skin from poultry. Removing all visible fats from meats. Steaming vegetables in water or broth. Meal planning  At meals, imagine dividing your plate into fourths: Fill one-half of your plate with vegetables and green salads. Fill one-fourth of your plate with whole grains. Fill one-fourth of your plate with lean protein foods. Eat 2-4 cups of vegetables per day. One cup of vegetables equals 1 cup (91 g) broccoli or cauliflower florets, 2 medium carrots, 1 large bell pepper, 1 large sweet potato, 1 large tomato, 1 medium white potato, 2 cups (150 g) raw leafy greens. Eat 1-2 cups of fruit per day. One cup of fruit equals 1 small apple, 1 large banana, 1 cup (237 g) mixed fruit, 1 large orange,  cup (82 g) dried fruit, 1 cup (240 mL) 100% fruit juice. Eat more foods that contain soluble fiber. Examples include apples, broccoli, carrots, beans, peas, and barley. Aim to get 25-30 g of fiber per day. Increase your consumption of  legumes, nuts, and seeds to 4-5 servings per week. One serving of dried beans or legumes equals  cup (90 g) cooked, 1 serving of nuts is  oz (12 almonds, 24 pistachios, or 7 walnut halves), and 1 serving of seeds equals  oz (8 g). Fats Choose healthy fats more often. Choose monounsaturated and polyunsaturated fats, such as olive and canola oils, avocado oil, flaxseeds, walnuts, almonds, and seeds. Eat more omega-3 fats. Choose salmon, mackerel, sardines, tuna, flaxseed oil, and ground flaxseeds. Aim to eat fish at least 2 times each week. Check food labels carefully to identify foods with trans fats or high amounts of saturated fat. Limit saturated fats. These are found in animal products, such as meats, butter, and cream. Plant sources of saturated fats include palm oil, palm kernel oil, and coconut oil. Avoid foods with partially hydrogenated oils in them. These contain trans fats. Examples are stick margarine, some tub margarines, cookies, crackers, and other baked goods. Avoid fried foods. General information Eat more home-cooked food and less restaurant, buffet, and fast food. Limit or avoid alcohol. Limit foods that are high in added sugar and simple starches such as foods made using white refined flour (white breads, pastries, sweets). Lose weight if you are overweight. Losing just 5-10% of your body weight can help your overall health and prevent diseases such as diabetes and heart disease. Monitor your sodium intake, especially if you have high  blood pressure. Talk with your health care provider about your sodium intake. Try to incorporate more vegetarian meals weekly. What foods should I eat? Fruits All fresh, canned (in natural juice), or frozen fruits. Vegetables Fresh or frozen vegetables (raw, steamed, roasted, or grilled). Green salads. Grains Most grains. Choose whole wheat and whole grains most of the time. Rice and pasta, including brown rice and pastas made with whole  wheat. Meats and other proteins Lean, well-trimmed beef, veal, pork, and lamb. Chicken and [place] without skin. All fish and shellfish. Wild duck, rabbit, pheasant, and venison. Egg whites or low-cholesterol egg substitutes. Dried beans, peas, lentils, and tofu. Seeds and most nuts. Dairy Low-fat or nonfat cheeses, including ricotta and mozzarella. Skim or 1% milk (liquid, powdered, or evaporated). Buttermilk made with low-fat milk. Nonfat or low-fat yogurt. Fats and oils Non-hydrogenated (trans-free) margarines. Vegetable oils, including soybean, sesame, sunflower, olive, avocado, peanut, safflower, corn, canola, and cottonseed. Salad dressings or mayonnaise made with a vegetable oil. Beverages Water (mineral or sparkling). Coffee and tea. Unsweetened ice tea. Diet beverages. Sweets and desserts Sherbet, gelatin, and fruit ice. Small amounts of dark chocolate. Limit all sweets and desserts. Seasonings and condiments All seasonings and condiments. The items listed above may not be a complete list of foods and beverages you can eat. Contact a dietitian for more options. What foods should I avoid? Fruits Canned fruit in heavy syrup. Fruit in cream or butter sauce. Fried fruit. Limit coconut. Vegetables Vegetables cooked in cheese, cream, or butter sauce. Fried vegetables. Grains Breads made with saturated or trans fats, oils, or whole milk. Croissants. Sweet rolls. Donuts. High-fat crackers, such as cheese crackers and chips. Meats and other proteins Fatty meats, such as hot dogs, ribs, sausage, bacon, rib-eye roast or steak. High-fat deli meats, such as salami and bologna. Caviar. Domestic duck and goose. Organ meats, such as liver. Dairy Cream, sour cream, cream cheese, and creamed cottage cheese. Whole-milk cheeses. Whole or 2% milk (liquid, evaporated, or condensed). Whole buttermilk. Cream sauce or high-fat cheese sauce. Whole-milk yogurt. Fats and oils Meat fat, or shortening. Cocoa  butter, hydrogenated oils, palm oil, coconut oil, palm kernel oil. Solid fats and shortenings, including bacon fat, salt pork, lard, and butter. Nondairy cream substitutes. Salad dressings with cheese or sour cream. Beverages Regular sodas and any drinks with added sugar. Sweets and desserts Frosting. Pudding. Cookies. Cakes. Pies. Milk chocolate or white chocolate. Buttered syrups. Full-fat ice cream or ice cream drinks. The items listed above may not be a complete list of foods and beverages to avoid. Contact a dietitian for more information. Summary Heart-healthy meal planning includes limiting unhealthy fats, increasing healthy fats, limiting salt (sodium) intake and making other diet and lifestyle changes. Lose weight if you are overweight. Losing just 5-10% of your body weight can help your overall health and prevent diseases such as diabetes and heart disease. Focus on eating a balance of foods, including fruits and vegetables, low-fat or nonfat dairy, lean protein, nuts and legumes, whole grains, and heart-healthy oils and fats. This information is not intended to replace advice given to you by your health care provider. Make sure you discuss any questions you have with your health care provider. Document Revised: 04/13/2021 Document Reviewed: 04/13/2021 Elsevier Patient Education  2024 [employer].

## 2022-12-11 LAB — COMPREHENSIVE METABOLIC PANEL
ALT: 57 IU/L — ABNORMAL HIGH (ref 0–44)
AST: 36 IU/L (ref 0–40)
Albumin: 4.4 g/dL (ref 3.9–4.9)
Alkaline Phosphatase: 94 IU/L (ref 44–121)
BUN/Creatinine Ratio: 12 (ref 10–24)
BUN: 13 mg/dL (ref 8–27)
Bilirubin Total: <0.2 mg/dL (ref 0.0–1.2)
CO2: 23 mmol/L (ref 20–29)
Calcium: 10.2 mg/dL (ref 8.6–10.2)
Chloride: 103 mmol/L (ref 96–106)
Creatinine, Ser: 1.1 mg/dL (ref 0.76–1.27)
Globulin, Total: 2.1 g/dL (ref 1.5–4.5)
Glucose: 94 mg/dL (ref 70–99)
Potassium: 4 mmol/L (ref 3.5–5.2)
Sodium: 140 mmol/L (ref 134–144)
Total Protein: 6.5 g/dL (ref 6.0–8.5)
eGFR: 74 mL/min/{1.73_m2} (ref >59)

## 2022-12-11 LAB — TSH: TSH: 3.06 u[IU]/mL (ref 0.450–4.500)

## 2023-01-18 ENCOUNTER — Ambulatory Visit: Payer: Medicare Other | Admitting: Internal Medicine

## 2023-01-18 ENCOUNTER — Encounter: Payer: Self-pay | Admitting: Internal Medicine

## 2023-01-18 VITALS — BP 130/70 | HR 61 | Temp 98.2°F | Ht 65.0 in | Wt 173.0 lb

## 2023-01-18 DIAGNOSIS — I10 Essential (primary) hypertension: Secondary | ICD-10-CM | POA: Diagnosis not present

## 2023-01-18 DIAGNOSIS — E66811 Obesity, class 1: Secondary | ICD-10-CM

## 2023-01-18 DIAGNOSIS — E1169 Type 2 diabetes mellitus with other specified complication: Secondary | ICD-10-CM

## 2023-01-18 DIAGNOSIS — Z8673 Personal history of transient ischemic attack (TIA), and cerebral infarction without residual deficits: Secondary | ICD-10-CM | POA: Diagnosis not present

## 2023-01-18 DIAGNOSIS — I480 Paroxysmal atrial fibrillation: Secondary | ICD-10-CM | POA: Diagnosis not present

## 2023-01-18 DIAGNOSIS — J449 Chronic obstructive pulmonary disease, unspecified: Secondary | ICD-10-CM

## 2023-01-18 DIAGNOSIS — Z7984 Long term (current) use of oral hypoglycemic drugs: Secondary | ICD-10-CM

## 2023-01-18 DIAGNOSIS — E669 Obesity, unspecified: Secondary | ICD-10-CM

## 2023-01-18 DIAGNOSIS — Z6830 Body mass index (BMI) 30.0-30.9, adult: Secondary | ICD-10-CM

## 2023-01-18 DIAGNOSIS — F0670 Mild neurocognitive disorder due to known physiological condition without behavioral disturbance: Secondary | ICD-10-CM

## 2023-01-18 DIAGNOSIS — F17200 Nicotine dependence, unspecified, uncomplicated: Secondary | ICD-10-CM

## 2023-01-18 DIAGNOSIS — I2510 Atherosclerotic heart disease of native coronary artery without angina pectoris: Secondary | ICD-10-CM

## 2023-01-18 LAB — COMPREHENSIVE METABOLIC PANEL
ALT: 114 U/L — ABNORMAL HIGH (ref 0–53)
AST: 38 U/L — ABNORMAL HIGH (ref 0–37)
Albumin: 4.4 g/dL (ref 3.5–5.2)
Alkaline Phosphatase: 83 U/L (ref 39–117)
BUN: 16 mg/dL (ref 6–23)
CO2: 31 meq/L (ref 19–32)
Calcium: 10.7 mg/dL — ABNORMAL HIGH (ref 8.4–10.5)
Chloride: 103 meq/L (ref 96–112)
Creatinine, Ser: 1.14 mg/dL (ref 0.40–1.50)
GFR: 67.02 mL/min (ref >60.00)
Glucose, Bld: 277 mg/dL — ABNORMAL HIGH (ref 70–99)
Potassium: 4.5 meq/L (ref 3.5–5.1)
Sodium: 141 meq/L (ref 135–145)
Total Bilirubin: 0.3 mg/dL (ref 0.2–1.2)
Total Protein: 7 g/dL (ref 6.0–8.3)

## 2023-01-18 LAB — HEMOGLOBIN A1C: Hgb A1c MFr Bld: 9.6 % — ABNORMAL HIGH (ref 4.6–6.5)

## 2023-01-18 MED ORDER — REPAGLINIDE 1 MG PO TABS: 1.0000 mg | ORAL_TABLET | Freq: Three times a day (TID) | ORAL | 5 refills | Status: DC

## 2023-01-18 NOTE — Patient Instructions (Signed)
Superglue plastic bonder

## 2023-01-18 NOTE — Assessment & Plan Note (Signed)
Smoking again 1.5 PPD - discussed

## 2023-01-18 NOTE — Assessment & Plan Note (Addendum)
H/o embolic stroke secondary to medication noncompliance.  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  MRI of the brain showed acute infarcts in the left pons and bilateral ophthalmic, bilateral parietal lobes with subacute infarct in the left frontal lobe with chronic lacunar infarcts.  He underwent a TEE that showed EF of 60 to 65% with no RWMA and no evidence of valvular abnormalities.  On ASA Smoking again 1.5 PPD - discussed R leg is weaker - using a crutch

## 2023-01-18 NOTE — Assessment & Plan Note (Signed)
Highest wt was 298 lbs Wt Readings from Last 3 Encounters:  01/18/23 173 lb (78.5 kg)  12/10/22 173 lb 6.4 oz (78.7 kg)  11/05/22 170 lb (77.1 kg)

## 2023-01-18 NOTE — Assessment & Plan Note (Signed)
Start Prandin, Metformin Get A1c Risks associated with diet/treatment noncompliance were discussed. Compliance was encouraged.

## 2023-01-18 NOTE — Assessment & Plan Note (Signed)
Post CVA °

## 2023-01-18 NOTE — Progress Notes (Signed)
Subjective:  Patient ID: Anthony Woods, male    DOB: Aug 29, 1955  Age: 67 y.o. MRN: [id number]  CC: Follow-up (3 [facility] F/U)   HPI Anthony Woods presents for CVA, CAD, HTN, DM Not taking repaglinide   Outpatient Medications Prior to Visit  Medication Sig Dispense Refill   acarbose (PRECOSE) 50 MG tablet Take 1 tablet (50 mg total) by mouth 3 (three) times daily with meals. Annual appt due in Oct must see provider for future refills 270 tablet 0   acetaminophen (TYLENOL) 325 MG tablet Take 1-2 tablets (325-650 mg total) by mouth every 6 (six) hours as needed for moderate pain or mild pain.     amiodarone (PACERONE) 200 MG tablet Take 1 tablet (200 mg total) by mouth daily. Annual appt due in Oct must see provider for future refills 90 tablet 0   aspirin EC 81 MG tablet Take 1 tablet (81 mg total) by mouth daily for 30 days then as directed by MD .Swallow whole. (Patient taking differently: Take 325 mg by mouth daily.) 120 tablet 0   Continuous Glucose Receiver (FREESTYLE LIBRE 3 READER) DEVI 1 each by Does not apply route daily. 1 each 3   Continuous Glucose Sensor (FREESTYLE LIBRE 3 SENSOR) MISC 1 each by Does not apply route daily. Place 1 sensor on the skin every 14 days. Use to check glucose continuously 2 each 3   Cyanocobalamin (B-12) 500 MCG TABS TAKE 1 TABLET (500 MCG TOTAL) BY MOUTH DAILY. (Patient taking differently: Take 1 tablet by mouth daily.) 90 tablet 3   diphenhydrAMINE (BENADRYL) 25 mg capsule Take 25 mg by mouth as needed (bee stings).     EPINEPHrine (EPIPEN 2-PAK) 0.3 mg/0.3 mL IJ SOAJ injection Inject 0.3 mg into the muscle as needed for anaphylaxis. 2 each 1   ezetimibe (ZETIA) 10 MG tablet Take 1 tablet (10 mg total) by mouth daily. 30 tablet 0   furosemide (LASIX) 20 MG tablet Take 1 tablet (20 mg total) by mouth daily as needed. 30 tablet 3   levothyroxine (SYNTHROID) 175 MCG tablet TAKE 1 TABLET BY MOUTH DAILY AT 6 AM. 90 tablet 0   melatonin 3 MG TABS tablet Take  1 tablet (3 mg total) by mouth at bedtime. 30 tablet 0   metFORMIN (GLUCOPHAGE) 1000 MG tablet Take 1 tablet (1,000 mg total) by mouth 2 (two) times daily with a meal. 60 tablet 0   metoprolol succinate (TOPROL-XL) 25 MG 24 hr tablet Take 0.5 tablets (12.5 mg total) by mouth daily. 15 tablet 0   NIFEdipine (PROCARDIA) 20 MG capsule Take 1 capsule (20 mg total) by mouth daily. 90 capsule 3   nitroGLYCERIN (NITROSTAT) 0.4 MG SL tablet Place 1 tablet (0.4 mg total) under the tongue every 5 (five) minutes as needed for chest pain. 20 tablet 3   QUEtiapine (SEROQUEL) 50 MG tablet Take 1 tablet (50 mg total) by mouth at bedtime. Annual appt due in Oct must see provider for future refills 90 tablet 0   repaglinide (PRANDIN) 1 MG tablet Take 1 tablet (1 mg total) by mouth 3 (three) times daily before meals. 90 tablet 5   rosuvastatin (CRESTOR) 20 MG tablet Take 1 tablet (20 mg total) by mouth daily. 30 tablet 0   rosuvastatin (CRESTOR) 40 MG tablet TAKE 1 TABLET BY MOUTH DAILY. 90 tablet 0   traZODone (DESYREL) 150 MG tablet TAKE 1/2 TABLET (75 MG TOTAL) BY MOUTH AT BEDTIME. 45 tablet 0   No facility-administered  medications prior to visit.    ROS: Review of Systems  Constitutional:  Negative for appetite change, fatigue and unexpected weight change.  HENT:  Negative for congestion, nosebleeds, sneezing, sore throat and trouble swallowing.   Eyes:  Negative for itching and visual disturbance.  Respiratory:  Negative for cough.   Cardiovascular:  Negative for chest pain, palpitations and leg swelling.  Gastrointestinal:  Negative for abdominal distention, blood in stool, diarrhea and nausea.  Genitourinary:  Negative for frequency and hematuria.  Musculoskeletal:  Negative for back pain, gait problem, joint swelling and neck pain.  Skin:  Negative for rash.  Neurological:  Negative for dizziness, tremors, speech difficulty and weakness.  Psychiatric/Behavioral:  Positive for decreased concentration.  Negative for agitation, dysphoric mood, sleep disturbance and suicidal ideas. The patient is not nervous/anxious.     Objective:  BP 130/70 (BP Location: Right Arm, Patient Position: Sitting, Cuff Size: Normal)   Pulse 61   Temp 98.2 F (36.8 C) (Oral)   Ht 5\' 5"  (1.651 m)   Wt 173 lb (78.5 kg)   SpO2 95%   BMI 28.79 kg/m   BP Readings from Last 3 Encounters:  01/18/23 130/70  12/10/22 (!) 164/80  11/12/22 (!) 179/79    Wt Readings from Last 3 Encounters:  01/18/23 173 lb (78.5 kg)  12/10/22 173 lb 6.4 oz (78.7 kg)  11/05/22 170 lb (77.1 kg)    Physical Exam Constitutional:      General: He is not in acute distress.    Appearance: He is well-developed.     Comments: NAD  Eyes:     Conjunctiva/sclera: Conjunctivae normal.     Pupils: Pupils are equal, round, and reactive to light.  Neck:     Thyroid: No thyromegaly.     Vascular: No JVD.  Cardiovascular:     Rate and Rhythm: Normal rate and regular rhythm.     Heart sounds: Normal heart sounds. No murmur heard.    No friction rub. No gallop.  Pulmonary:     Effort: Pulmonary effort is normal. No respiratory distress.     Breath sounds: Normal breath sounds. No wheezing or rales.  Chest:     Chest wall: No tenderness.  Abdominal:     General: Bowel sounds are normal. There is no distension.     Palpations: Abdomen is soft. There is no mass.     Tenderness: There is no abdominal tenderness. There is no guarding or rebound.  Musculoskeletal:        General: No tenderness. Normal range of motion.     Cervical back: Normal range of motion.     Right lower leg: No edema.     Left lower leg: No edema.  Lymphadenopathy:     Cervical: No cervical adenopathy.  Skin:    General: Skin is warm and dry.     Findings: No rash.  Neurological:     Mental Status: He is alert. Mental status is at baseline.     Cranial Nerves: No cranial nerve deficit.     Motor: No abnormal muscle tone.     Coordination: Coordination  normal.     Gait: Gait normal.     Deep Tendon Reflexes: Reflexes are normal and symmetric.  Psychiatric:        Behavior: Behavior normal.        Thought Content: Thought content normal.        Judgment: Judgment normal.   R leg is weaker - using a  crutch  Lab Results  Component Value Date   WBC 7.3 10/18/2022   HGB 12.9 (L) 10/18/2022   HCT 38.6 (L) 10/18/2022   PLT 191.0 10/18/2022   GLUCOSE 94 12/10/2022   CHOL 197 08/13/2022   TRIG 109 08/13/2022   HDL 47 08/13/2022   LDLDIRECT 100.0 12/09/2014   LDLCALC 128 (H) 08/13/2022   ALT 57 (H) 12/10/2022   AST 36 12/10/2022   NA 140 12/10/2022   K 4.0 12/10/2022   CL 103 12/10/2022   CREATININE 1.10 12/10/2022   BUN 13 12/10/2022   CO2 23 12/10/2022   TSH 3.060 12/10/2022   PSA 0.41 09/14/2017   INR 1.0 08/10/2022   HGBA1C 9.9 (H) 10/18/2022   MICROALBUR 4.7 (H) 12/06/2013    No results found.  Assessment & Plan:   Problem List Items Addressed This Visit     Diabetes mellitus type 2 in obese    Start Prandin, Metformin Get A1c Risks associated with diet/treatment noncompliance were discussed. Compliance was encouraged.       Relevant Orders   Comprehensive metabolic panel   Hemoglobin A1c   Essential hypertension    BP is controlled      Coronary atherosclerosis    Smoking again 1.5 PPD - discussed      Obesity    Highest wt was 298 lbs Wt Readings from Last 3 Encounters:  01/18/23 173 lb (78.5 kg)  12/10/22 173 lb 6.4 oz (78.7 kg)  11/05/22 170 lb (77.1 kg)         Atrial fibrillation (HCC)    Off Eliquis, Xarelto due to cost Discussed options, ie Coumadin On ASA 325 mg/mo Smoking again 1.5 PPD - discussed      Tobacco dependence    Smoking again 1.5 PPD - discussed      COPD (chronic obstructive pulmonary disease) (HCC)    Smoking again 1.5 PPD - discussed      History of CVA (cerebrovascular accident) - Primary (Chronic)    H/o embolic stroke secondary to medication noncompliance.   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  MRI of the brain showed acute infarcts in the left pons and bilateral ophthalmic, bilateral parietal lobes with subacute infarct in the left frontal lobe with chronic lacunar infarcts.  He underwent a TEE that showed EF of 60 to 65% with no RWMA and no evidence of valvular abnormalities.  On ASA Smoking again 1.5 PPD - discussed R leg is weaker - using a crutch       Mild neurocognitive disorder due to another medical condition    Post-CVA         No orders of the defined types were placed in this encounter.     Follow-up: Return in about 3 months (around 04/20/2023) for a follow-up visit.  Sonda Primes, MD

## 2023-01-18 NOTE — Assessment & Plan Note (Addendum)
Off Eliquis, Xarelto due to cost Discussed options, ie Coumadin On ASA 325 mg/mo Smoking again 1.5 PPD - discussed

## 2023-01-18 NOTE — Assessment & Plan Note (Signed)
BP is controlled 

## 2023-01-23 ENCOUNTER — Encounter: Payer: Self-pay | Admitting: Internal Medicine

## 2023-02-04 ENCOUNTER — Encounter
Payer: Medicare Other | Attending: Physical Medicine and Rehabilitation | Admitting: Physical Medicine and Rehabilitation

## 2023-02-04 ENCOUNTER — Encounter: Payer: Self-pay | Admitting: Physical Medicine and Rehabilitation

## 2023-02-04 VITALS — BP 187/71 | HR 61 | Ht 65.0 in | Wt 175.0 lb

## 2023-02-04 DIAGNOSIS — I10 Essential (primary) hypertension: Secondary | ICD-10-CM | POA: Insufficient documentation

## 2023-02-04 DIAGNOSIS — E1169 Type 2 diabetes mellitus with other specified complication: Secondary | ICD-10-CM | POA: Insufficient documentation

## 2023-02-04 DIAGNOSIS — E669 Obesity, unspecified: Secondary | ICD-10-CM | POA: Diagnosis present

## 2023-02-04 DIAGNOSIS — I639 Cerebral infarction, unspecified: Secondary | ICD-10-CM | POA: Insufficient documentation

## 2023-02-04 DIAGNOSIS — Z794 Long term (current) use of insulin: Secondary | ICD-10-CM

## 2023-02-04 DIAGNOSIS — M25361 Other instability, right knee: Secondary | ICD-10-CM | POA: Insufficient documentation

## 2023-02-04 DIAGNOSIS — I480 Paroxysmal atrial fibrillation: Secondary | ICD-10-CM | POA: Diagnosis not present

## 2023-02-04 DIAGNOSIS — R454 Irritability and anger: Secondary | ICD-10-CM | POA: Diagnosis not present

## 2023-02-04 MED ORDER — IRBESARTAN 75 MG PO TABS: 75.0000 mg | ORAL_TABLET | Freq: Every day | ORAL | 3 refills | Status: DC

## 2023-02-04 NOTE — Progress Notes (Addendum)
Subjective:    Patient ID: Anthony Woods, male    DOB: 15-Sep-1955, 67 y.o.   MRN: [id number]  HPI Anthony Woods is a 67 year old man who was seen in CIR for CVA  1) CVA -he has bee recovering well -his sisters and brother have been caring for him as he was recently hospitalized -he needs all his medications refilled -he is doing a lot of walking, most of the time he is using cane -he has not had any falls  2) HTN: BP is 187/71 -does not having blood pressure cuff at home -he takes nifedipine, metoprolol, lasix prn for leg swelling, amiodarone   Review of Systems     Objective:   Physical Exam Gen: no distress, normal appearing HEENT: oral mucosa pink and moist, NCAT Cardio: Reg rate Chest: normal effort, normal rate of breathing Abd: soft, non-distended Ext: no edema Psych: pleasant, normal affect Skin: intact Neuro: Alert and oriented x2, 5/5 strength throughout      Assessment & Plan:   1) CVA -all medications refilled -continue walking  -continue wane -encouraged resistance training  2) Atrial fibrillation -discussed with sister that family is unable to afford $500 cost of Eliquis -advised following up with PCP regarding alternative options such as warfarin -advised use of turmeric with black pepper in the mean time -discussed that amiodarone and metoprolol help to control heart rate while Eliquis thins the blood -HR reviewed and [place] 67 today  3) Type 2 diabetes -discussed that the Tradjenta is too costly for him too, but he has enough currently until tomorrow at bedtime -continue metformin and acarbose -discussed avoiding added sugars -discussed avoiding artificial sweeteners -advised getting any over the counter glucometer  4) HTN: -BP is 187/71 -avapro 75 mg added -continue amiodarone, metprolol, lasix prn, nifedipine -Advised checking BP daily at home and logging results to bring into follow-up appointment with PCP and myself. -Reviewed BP meds  today.  -Advised regarding healthy foods that can help lower blood pressure and provided with a list: 1) citrus foods- high in vitamins and minerals 2) salmon and other fatty fish - reduces inflammation and oxylipins 3) swiss chard (leafy green)- high level of nitrates 4) pumpkin seeds- one of the best natural sources of magnesium 5) Beans and lentils- high in fiber, magnesium, and potassium 6) Berries- high in flavonoids 7) Amaranth (whole grain, can be cooked similarly to rice and oats)- high in magnesium and fiber 8) Pistachios- even more effective at reducing BP than other nuts 9) Carrots- high in phenolic compounds that relax blood vessels and reduce inflammation 10) Celery- contain phthalides that relax tissues of arterial walls 11) Tomatoes- can also improve cholesterol and reduce risk of heart disease 12) Broccoli- good source of magnesium, calcium, and potassium 13) Greek yogurt: high in potassium and calcium 14) Herbs and spices: Celery seed, cilantro, saffron, lemongrass, black cumin, ginseng, cinnamon, cardamom, sweet basil, and ginger 15) Chia and flax seeds- also help to lower cholesterol and blood sugar 16) Beets- high levels of nitrates that relax blood vessels  17) spinach and bananas- high in potassium  -Provided lise of supplements that can help with hypertension:  1) magnesium: one high quality brand is Bioptemizers since it contains all 7 types of magnesium, otherwise over the counter magnesium gluconate 400mg  is a good option 2) B vitamins 3) vitamin D 4) potassium 5) CoQ10 6) L-arginine 7) Vitamin C 8) Beetroot -Educated that goal BP is 120/80. -Made goal to incorporate some of the  above foods into diet.    5) Short temper: -discussed neuropsych follow-up  6) Knee buckling: -advised knee sleeve -discussed quad strengthening with PT -discussed leg lifts while sitting

## 2023-02-04 NOTE — Addendum Note (Signed)
Addended by: Horton Chin on: 02/04/2023 11:21 AM   Modules accepted: Orders

## 2023-02-07 ENCOUNTER — Other Ambulatory Visit: Payer: Self-pay | Admitting: Internal Medicine

## 2023-02-15 ENCOUNTER — Other Ambulatory Visit: Payer: Self-pay | Admitting: Internal Medicine

## 2023-02-19 ENCOUNTER — Other Ambulatory Visit: Payer: Self-pay | Admitting: Internal Medicine

## 2023-02-21 NOTE — Progress Notes (Unsigned)
Cardiology Office Note    Patient Name: Anthony Woods Date of Encounter: 02/21/2023  Primary Care Provider:  Tresa Garter, MD Primary Cardiologist:  Verne Carrow, MD Primary Electrophysiologist: None   Past Medical History    Past Medical History:  Diagnosis Date   Acute embolic stroke (HCC) 08/13/2022   Allergy to bee sting    Anxiety    Arthritis    "right knee" (05/12/2016)   CAD (coronary artery disease)    a. DES to LCx 04/2016. b. DES to prox Cx 08/2017. c. Stable cath 09/2018 with nonobstructive residual disease, normal EF.   Carotid artery disease (HCC)    Headache    "when his sugar goes too high or too low" (05/12/2016)   HTN (hypertension)    Hyperlipidemia    Hypothyroidism    Macular degeneration    Obesity    PAF (paroxysmal atrial fibrillation) (HCC)    Type II diabetes mellitus (HCC)     History of Present Illness  Anthony Woods is a 67 y.o. male with a PMH of CAD s/p DES to left circumflex 2019 with DES to proximal circumflex in 2019, HTN, HLD, PAF, embolic CVA 07/2022, DM type II, hypothyroidism, osteoarthritis tobacco abuse who presents for 71-month follow-up.  Anthony Woods was last seen 11/2022 for hospital follow-up symptomatic AF with RVR with failed cardioversion and transition to amiodarone.  He was previously referred to the AF clinic but did not establish.  During the visit patient was working with PT and OT due to residual weakness from previous stroke and was currently on ASA 325 mg and was provided assistance paperwork for Eliquis.  He had discussed Coumadin as an alternative with his PCP.  He was chest pain  and BP was elevated but appeared controlled at follow-up with PCP in October.  During today's visit the patient reports*** .  Patient denies chest pain, palpitations, dyspnea, PND, orthopnea, nausea, vomiting, dizziness, syncope, edema, weight gain, or early satiety.  ***Notes: -Last ischemic evaluation: -Last echo: -Interim ED  visits: Review of Systems  Please see the history of present illness.    All other systems reviewed and are otherwise negative except as noted above.  Physical Exam    Wt Readings from Last 3 Encounters:  02/04/23 175 lb (79.4 kg)  01/18/23 173 lb (78.5 kg)  12/10/22 173 lb 6.4 oz (78.7 kg)   [id number] were no vitals filed for this visit.,There is no height or weight on file to calculate BMI. GEN: Well nourished, well developed in no acute distress Neck: No JVD; No carotid bruits Pulmonary: Clear to auscultation without rales, wheezing or rhonchi  Cardiovascular: Normal rate. Regular rhythm. Normal S1. Normal S2.   Murmurs: There is no murmur.  ABDOMEN: Soft, non-tender, non-distended EXTREMITIES:  No edema; No deformity   EKG/LABS/ Recent Cardiac Studies   ECG personally reviewed by me today - ***  Risk Assessment/Calculations:   {Does this patient have ATRIAL FIBRILLATION?:[phone]}      Lab Results  Component Value Date   WBC 7.3 10/18/2022   HGB 12.9 (L) 10/18/2022   HCT 38.6 (L) 10/18/2022   MCV 92.9 10/18/2022   PLT 191.0 10/18/2022   Lab Results  Component Value Date   CREATININE 1.14 01/18/2023   BUN 16 01/18/2023   NA 141 01/18/2023   K 4.5 01/18/2023   CL 103 01/18/2023   CO2 31 01/18/2023   Lab Results  Component Value Date   CHOL 197 08/13/2022   HDL  47 08/13/2022   LDLCALC 128 (H) 08/13/2022   LDLDIRECT 100.0 12/09/2014   TRIG 109 08/13/2022   CHOLHDL 4.2 08/13/2022    Lab Results  Component Value Date   HGBA1C 9.6 (H) 01/18/2023   Assessment & Plan    1.  Coronary artery disease: -s/p LHC with PCI to circumflex 04/2016, DES to proximal circumflex 08/2017 with most recent LHC completed 09/2018 showing patent stents -Today patient reports***  2.  PAF: -Patient currently on amiodarone 200 mg with rate currently controlled at 71 bpm. -He is currently not on anticoagulation due to affordability concerns. -Patient was provided assistance  paperwork for Eliquis and we will plan to reinitiate if approved in the future. -CHA2DS2-VASc Score = 6 [CHF History: 0, HTN History: 1, Diabetes History: 1, Stroke History: 2, Vascular Disease History: 1, Age Score: 1, Gender Score: 0].  Therefore, the patient's annual risk of stroke is 9.7 %.     -Continue rate control with amiodarone 200 mg and Toprol-XL 12.5 mg -Will complete surveillance labs with TSH, CMET   3.  History of CVA: -s/p embolic stroke 07/2022 secondary to medication noncompliance -Today patient is doing well and currently working with PT/OT -Patient is interested in resuming Eliquis and was provided assistance paperwork today. -Continue ASA 81 mg   4.  Essential hypertension: -Patient's blood pressure today is***   5.  Tobacco abuse: -Smoking cessation encouraged   6.  Hyperlipidemia: -Patient's last LDL cholesterol was 128 -Continue Crestor 40 mg daily      Disposition: Follow-up with Verne Carrow, MD or APP in *** months {Are you ordering a CV Procedure (e.g. stress test, cath, DCCV, TEE, etc)?   Press F2        :[id number]}   Signed, Anthony Woods, Leodis Rains, NP 02/21/2023, 9:46 AM [facility]

## 2023-02-22 ENCOUNTER — Other Ambulatory Visit: Payer: Self-pay

## 2023-02-22 ENCOUNTER — Ambulatory Visit: Payer: Medicare Other | Attending: Nurse Practitioner | Admitting: Nurse Practitioner

## 2023-02-22 ENCOUNTER — Telehealth: Payer: Self-pay

## 2023-02-22 ENCOUNTER — Encounter: Payer: Self-pay | Admitting: Nurse Practitioner

## 2023-02-22 VITALS — BP 130/60 | HR 71 | Ht 65.0 in | Wt 173.0 lb

## 2023-02-22 DIAGNOSIS — E785 Hyperlipidemia, unspecified: Secondary | ICD-10-CM

## 2023-02-22 DIAGNOSIS — I10 Essential (primary) hypertension: Secondary | ICD-10-CM | POA: Insufficient documentation

## 2023-02-22 DIAGNOSIS — I480 Paroxysmal atrial fibrillation: Secondary | ICD-10-CM | POA: Insufficient documentation

## 2023-02-22 DIAGNOSIS — I2510 Atherosclerotic heart disease of native coronary artery without angina pectoris: Secondary | ICD-10-CM | POA: Diagnosis present

## 2023-02-22 DIAGNOSIS — Z8673 Personal history of transient ischemic attack (TIA), and cerebral infarction without residual deficits: Secondary | ICD-10-CM | POA: Diagnosis present

## 2023-02-22 MED ORDER — APIXABAN 5 MG PO TABS: 5.0000 mg | ORAL_TABLET | Freq: Two times a day (BID) | ORAL | 0 refills | Status: DC

## 2023-02-22 MED ORDER — APIXABAN 5 MG PO TABS: 5.0000 mg | ORAL_TABLET | Freq: Two times a day (BID) | ORAL | 6 refills | Status: AC

## 2023-02-22 NOTE — Patient Instructions (Addendum)
Medication Instructions:  STOP Aspirin  START Eliquis 5mg  Take 1 tablet once a day  *If you need a refill on your cardiac medications before your next appointment, please call your pharmacy*   Lab Work: None ordered   Testing/Procedures: None ordered   Follow-Up: At [facility], you and your health needs are our priority.  As part of our continuing mission to provide you with exceptional heart care, we have created designated Provider Care Teams.  These Care Teams include your primary Cardiologist (physician) and Advanced Practice Providers (APPs -  Physician Assistants and Nurse Practitioners) who all work together to provide you with the care you need, when you need it.  We recommend signing up for the patient portal called "MyChart".  Sign up information is provided on this After Visit Summary.  MyChart is used to connect with patients for Virtual Visits (Telemedicine).  Patients are able to view lab/test results, encounter notes, upcoming appointments, etc.  Non-urgent messages can be sent to your provider as well.   To learn more about what you can do with MyChart, go to [email or web address].    Your next appointment:   6 month(s)  Provider:   Verne Carrow, MD     Other Instructions  1-800-QUIT-NOW  Low-Sodium Eating Plan  Heart-Healthy Eating Plan Eating a healthy diet is important for the health of your heart. A heart-healthy eating plan includes: Eating less unhealthy fats. Eating more healthy fats. Eating less salt in your food. Salt is also called sodium. Making other changes in your diet. Talk with your doctor or a diet specialist (dietitian) to create an eating plan that is right for you. What is my plan? Your doctor may recommend an eating plan that includes: Total fat: ______% or less of total calories a day. Saturated fat: ______% or less of total calories a day. Cholesterol: less than _________mg a day. Sodium: less than _________mg a  day. What are tips for following this plan? Cooking Avoid frying your food. Try to bake, boil, grill, or broil it instead. You can also reduce fat by: Removing the skin from poultry. Removing all visible fats from meats. Steaming vegetables in water or broth. Meal planning  At meals, divide your plate into four equal parts: Fill one-half of your plate with vegetables and green salads. Fill one-fourth of your plate with whole grains. Fill one-fourth of your plate with lean protein foods. Eat 2-4 cups of vegetables per day. One cup of vegetables is: 1 cup (91 g) broccoli or cauliflower florets. 2 medium carrots. 1 large bell pepper. 1 large sweet potato. 1 large tomato. 1 medium white potato. 2 cups (150 g) raw leafy greens. Eat 1-2 cups of fruit per day. One cup of fruit is: 1 small apple 1 large banana 1 cup (237 g) mixed fruit, 1 large orange,  cup (82 g) dried fruit, 1 cup (240 mL) 100% fruit juice. Eat more foods that have soluble fiber. These are apples, broccoli, carrots, beans, peas, and barley. Try to get 20-30 g of fiber per day. Eat 4-5 servings of nuts, legumes, and seeds per week: 1 serving of dried beans or legumes equals  cup (90 g) cooked. 1 serving of nuts is  oz (12 almonds, 24 pistachios, or 7 walnut halves). 1 serving of seeds equals  oz (8 g). General information Eat more home-cooked food. Eat less restaurant, buffet, and fast food. Limit or avoid alcohol. Limit foods that are high in starch and sugar. Avoid fried foods. Lose  weight if you are overweight. Keep track of how much salt (sodium) you eat. This is important if you have high blood pressure. Ask your doctor to tell you more about this. Try to add vegetarian meals each week. Fats Choose healthy fats. These include olive oil and canola oil, flaxseeds, walnuts, almonds, and seeds. Eat more omega-3 fats. These include salmon, mackerel, sardines, tuna, flaxseed oil, and ground flaxseeds. Try to  eat fish at least 2 times each week. Check food labels. Avoid foods with trans fats or high amounts of saturated fat. Limit saturated fats. These are often found in animal products, such as meats, butter, and cream. These are also found in plant foods, such as palm oil, palm kernel oil, and coconut oil. Avoid foods with partially hydrogenated oils in them. These have trans fats. Examples are stick margarine, some tub margarines, cookies, crackers, and other baked goods. What foods should I eat? Fruits All fresh, canned (in natural juice), or frozen fruits. Vegetables Fresh or frozen vegetables (raw, steamed, roasted, or grilled). Green salads. Grains Most grains. Choose whole wheat and whole grains most of the time. Rice and pasta, including brown rice and pastas made with whole wheat. Meats and other proteins Lean, well-trimmed beef, veal, pork, and lamb. Chicken and [place] without skin. All fish and shellfish. Wild duck, rabbit, pheasant, and venison. Egg whites or low-cholesterol egg substitutes. Dried beans, peas, lentils, and tofu. Seeds and most nuts. Dairy Low-fat or nonfat cheeses, including ricotta and mozzarella. Skim or 1% milk that is liquid, powdered, or evaporated. Buttermilk that is made with low-fat milk. Nonfat or low-fat yogurt. Fats and oils Non-hydrogenated (trans-free) margarines. Vegetable oils, including soybean, sesame, sunflower, olive, peanut, safflower, corn, canola, and cottonseed. Salad dressings or mayonnaise made with a vegetable oil. Beverages Mineral water. Coffee and tea. Diet carbonated beverages. Sweets and desserts Sherbet, gelatin, and fruit ice. Small amounts of dark chocolate. Limit all sweets and desserts. Seasonings and condiments All seasonings and condiments. The items listed above may not be a complete list of foods and drinks you can eat. Contact a dietitian for more options. What foods should I avoid? Fruits Canned fruit in heavy syrup.  Fruit in cream or butter sauce. Fried fruit. Limit coconut. Vegetables Vegetables cooked in cheese, cream, or butter sauce. Fried vegetables. Grains Breads that are made with saturated or trans fats, oils, or whole milk. Croissants. Sweet rolls. Donuts. High-fat crackers, such as cheese crackers. Meats and other proteins Fatty meats, such as hot dogs, ribs, sausage, bacon, rib-eye roast or steak. High-fat deli meats, such as salami and bologna. Caviar. Domestic duck and goose. Organ meats, such as liver. Dairy Cream, sour cream, cream cheese, and creamed cottage cheese. Whole-milk cheeses. Whole or 2% milk that is liquid, evaporated, or condensed. Whole buttermilk. Cream sauce or high-fat cheese sauce. Yogurt that is made from whole milk. Fats and oils Meat fat, or shortening. Cocoa butter, hydrogenated oils, palm oil, coconut oil, palm kernel oil. Solid fats and shortenings, including bacon fat, salt pork, lard, and butter. Nondairy cream substitutes. Salad dressings with cheese or sour cream. Beverages Regular sodas and juice drinks with added sugar. Sweets and desserts Frosting. Pudding. Cookies. Cakes. Pies. Milk chocolate or white chocolate. Buttered syrups. Full-fat ice cream or ice cream drinks. The items listed above may not be a complete list of foods and drinks to avoid. Contact a dietitian for more information. Summary Heart-healthy meal planning includes eating less unhealthy fats, eating more healthy fats, and making other  changes in your diet. Eat a balanced diet. This includes fruits and vegetables, low-fat or nonfat dairy, lean protein, nuts and legumes, whole grains, and heart-healthy oils and fats. This information is not intended to replace advice given to you by your health care provider. Make sure you discuss any questions you have with your health care provider. Document Revised: 04/13/2021 Document Reviewed: 04/13/2021 Elsevier Patient Education  2024 [employer]  Inc[employer]  Salt (sodium) helps you keep a healthy balance of fluids in your body. Too much sodium can raise your blood pressure. It can also cause fluid and waste to be held in your body. Your health care provider or dietitian may recommend a low-sodium eating plan if you have high blood pressure (hypertension), kidney disease, liver disease, or heart failure. Eating less sodium can help lower your blood pressure and reduce swelling. It can also protect your heart, liver, and kidneys. What are tips for following this plan? Reading food labels  Check food labels for the amount of sodium per serving. If you eat more than one serving, you must multiply the listed amount by the number of servings. Choose foods with less than 140 milligrams (mg) of sodium per serving. Avoid foods with 300 mg of sodium or more per serving. Always check how much sodium is in a product, even if the label says "unsalted" or "no salt added." Shopping  Buy products labeled as "low-sodium" or "no salt added." Buy fresh foods. Avoid canned foods and pre-made or frozen meals. Avoid canned, cured, or processed meats. Buy breads that have less than 80 mg of sodium per slice. Cooking  Eat more home-cooked food. Try to eat less restaurant, buffet, and fast food. Try not to add salt when you cook. Use salt-free seasonings or herbs instead of table salt or sea salt. Check with your provider or pharmacist before using salt substitutes. Cook with plant-based oils, such as canola, sunflower, or olive oil. Meal planning When eating at a restaurant, ask if your food can be made with less salt or no salt. Avoid dishes labeled as brined, pickled, cured, or smoked. Avoid dishes made with soy sauce, miso, or teriyaki sauce. Avoid foods that have monosodium glutamate (MSG) in them. MSG may be added to some restaurant food, sauces, soups, bouillon, and canned foods. Make meals that can be grilled, baked, poached, roasted, or steamed. These  are often made with less sodium. General information Try to limit your sodium intake to 1,500-2,300 mg each day, or the amount told by your provider. What foods should I eat? Fruits Fresh, frozen, or canned fruit. Fruit juice. Vegetables Fresh or frozen vegetables. "No salt added" canned vegetables. "No salt added" tomato sauce and paste. Low-sodium or reduced-sodium tomato and vegetable juice. Grains Low-sodium cereals, such as oats, puffed wheat and rice, and shredded wheat. Low-sodium crackers. Unsalted rice. Unsalted pasta. Low-sodium bread. Whole grain breads and whole grain pasta. Meats and other proteins Fresh or frozen meat, poultry, seafood, and fish. These should have no added salt. Low-sodium canned tuna and salmon. Unsalted nuts. Dried peas, beans, and lentils without added salt. Unsalted canned beans. Eggs. Unsalted nut butters. Dairy Milk. Soy milk. Cheese that is naturally low in sodium, such as ricotta cheese, fresh mozzarella, or Swiss cheese. Low-sodium or reduced-sodium cheese. Cream cheese. Yogurt. Seasonings and condiments Fresh and dried herbs and spices. Salt-free seasonings. Low-sodium mustard and ketchup. Sodium-free salad dressing. Sodium-free light mayonnaise. Fresh or refrigerated horseradish. Lemon juice. Vinegar. Other foods Homemade, reduced-sodium, or low-sodium soups. Unsalted  popcorn and pretzels. Low-salt or salt-free chips. The items listed above may not be all the foods and drinks you can have. Talk to a dietitian to learn more. What foods should I avoid? Vegetables Sauerkraut, pickled vegetables, and relishes. Olives. [place] fries. Onion rings. Regular canned vegetables, except low-sodium or reduced-sodium items. Regular canned tomato sauce and paste. Regular tomato and vegetable juice. Frozen vegetables in sauces. Grains Instant hot cereals. Bread stuffing, pancake, and biscuit mixes. Croutons. Seasoned rice or pasta mixes. Noodle soup cups. Boxed or  frozen macaroni and cheese. Regular salted crackers. Self-rising flour. Meats and other proteins Meat or fish that is salted, canned, smoked, spiced, or pickled. Precooked or cured meat, such as sausages or meat loaves. Tomasa Blase. Ham. Pepperoni. Hot dogs. Corned beef. Chipped beef. Salt pork. Jerky. Pickled herring, anchovies, and sardines. Regular canned tuna. Salted nuts. Dairy Processed cheese and cheese spreads. Hard cheeses. Cheese curds. Blue cheese. Feta cheese. String cheese. Regular cottage cheese. Buttermilk. Canned milk. Fats and oils Salted butter. Regular margarine. Ghee. Bacon fat. Seasonings and condiments Onion salt, garlic salt, seasoned salt, table salt, and sea salt. Canned and packaged gravies. Worcestershire sauce. Tartar sauce. Barbecue sauce. Teriyaki sauce. Soy sauce, including reduced-sodium soy sauce. Steak sauce. Fish sauce. Oyster sauce. Cocktail sauce. Horseradish that you find on the shelf. Regular ketchup and mustard. Meat flavorings and tenderizers. Bouillon cubes. Hot sauce. Pre-made or packaged marinades. Pre-made or packaged taco seasonings. Relishes. Regular salad dressings. Salsa. Other foods Salted popcorn and pretzels. Corn chips and puffs. Potato and tortilla chips. Canned or dried soups. Pizza. Frozen entrees and pot pies. The items listed above may not be all the foods and drinks you should avoid. Talk to a dietitian to learn more. This information is not intended to replace advice given to you by your health care provider. Make sure you discuss any questions you have with your health care provider. Document Revised: 03/25/2022 Document Reviewed: 03/25/2022 Elsevier Patient Education  2024 [employer].

## 2023-02-22 NOTE — Telephone Encounter (Signed)
Gaston Islam., NP  Alveta Heimlich, CMA We can order them now .       Previous Messages    ----- Message ----- From: Alveta Heimlich, CMA Sent: 02/22/2023  10:03 AM EST To: Gaston Islam., NP  Ok. How soon do you want these labs completed? ----- Message ----- From: Gaston Islam., NP Sent: 02/22/2023   9:40 AM EST To: Alveta Heimlich, CMA  Please order fasting LFTs and lipids

## 2023-02-22 NOTE — Telephone Encounter (Signed)
Patient made aware and voiced understanding. Advised the patient to have labs completed by the end of the week. Patient agreeable and voiced understanding.   Lab orders placed.

## 2023-03-04 ENCOUNTER — Other Ambulatory Visit (HOSPITAL_COMMUNITY): Payer: Self-pay

## 2023-05-06 ENCOUNTER — Encounter
Payer: No Typology Code available for payment source | Attending: Physical Medicine and Rehabilitation | Admitting: Physical Medicine and Rehabilitation

## 2023-05-06 VITALS — BP 180/80 | HR 56 | Ht 65.0 in | Wt 168.0 lb

## 2023-05-06 DIAGNOSIS — F17200 Nicotine dependence, unspecified, uncomplicated: Secondary | ICD-10-CM | POA: Insufficient documentation

## 2023-05-06 DIAGNOSIS — I639 Cerebral infarction, unspecified: Secondary | ICD-10-CM | POA: Diagnosis not present

## 2023-05-06 DIAGNOSIS — R454 Irritability and anger: Secondary | ICD-10-CM | POA: Diagnosis present

## 2023-05-06 MED ORDER — IRBESARTAN 150 MG PO TABS: 150.0000 mg | ORAL_TABLET | Freq: Every day | ORAL | 11 refills | Status: DC

## 2023-05-06 NOTE — Progress Notes (Signed)
Subjective:    Patient ID: [name], male    DOB: Jan 18, 1956, 68 y.o.   MRN: [id number]  HPI Mr. Kosinski is a 68 year old man who was seen in [facility] for CVA  1) CVA -he has been recovering well, walking well -his wife was in the hospital and his sisters were talking badly about her- this is not affecting his care -his sisters and brother have been caring for him as he was recently hospitalized -he needs all his medications refilled -he is doing a lot of walking, most of the time he is using cane -he has not had any falls  2) HTN: BP is 187/71 -he was aggravated by having to wait too long -does not having blood pressure cuff at home -he takes nifedipine, metoprolol, lasix prn for leg swelling, amiodarone   Review of Systems     Objective:   Physical Exam Gen: no distress, normal appearing HEENT: oral mucosa pink and moist, NCAT Cardio: Reg rate Chest: normal effort, normal rate of breathing Abd: soft, non-distended Ext: no edema Psych: pleasant, normal affect Skin: intact Neuro: Alert and oriented x2, 5/5 strength throughout, stable 05/06/23      Assessment & Plan:   1) CVA -all medications refilled -continue walking  -continue wane -encouraged resistance training -discussed Vivistem -discussed FL2 form and starting this process early as he and his wife have impaired mobility and ADLs.   2) Atrial fibrillation -discussed with sister that family is unable to afford $500 cost of Eliquis -advised following up with PCP regarding alternative options such as warfarin -advised use of turmeric with black pepper in the mean time -discussed that amiodarone and metoprolol help to control heart rate while Eliquis thins the blood -HR reviewed and [place] 67 today  3) Type 2 diabetes -discussed that the Tradjenta is too costly for him too, but he has enough currently until tomorrow at bedtime -continue metformin and acarbose -discussed avoiding added sugars -discussed  avoiding artificial sweeteners -advised getting any over the counter glucometer  4) HTN: -BP is 187/71 -increase avapro to 150mg  daily -continue amiodarone, metprolol, lasix prn, nifedipine -Advised checking BP daily at home and logging results to bring into follow-up appointment with PCP and myself. -Reviewed BP meds today.  -Advised regarding healthy foods that can help lower blood pressure and provided with a list: 1) citrus foods- high in vitamins and minerals 2) salmon and other fatty fish - reduces inflammation and oxylipins 3) swiss chard (leafy green)- high level of nitrates 4) pumpkin seeds- one of the best natural sources of magnesium 5) Beans and lentils- high in fiber, magnesium, and potassium 6) Berries- high in flavonoids 7) Amaranth (whole grain, can be cooked similarly to rice and oats)- high in magnesium and fiber 8) Pistachios- even more effective at reducing BP than other nuts 9) Carrots- high in phenolic compounds that relax blood vessels and reduce inflammation 10) Celery- contain phthalides that relax tissues of arterial walls 11) Tomatoes- can also improve cholesterol and reduce risk of heart disease 12) Broccoli- good source of magnesium, calcium, and potassium 13) Greek yogurt: high in potassium and calcium 14) Herbs and spices: Celery seed, cilantro, saffron, lemongrass, black cumin, ginseng, cinnamon, cardamom, sweet basil, and ginger 15) Chia and flax seeds- also help to lower cholesterol and blood sugar 16) Beets- high levels of nitrates that relax blood vessels  17) spinach and bananas- high in potassium  -Provided lise of supplements that can help with hypertension:  1) magnesium:  one high quality brand is Bioptemizers since it contains all 7 types of magnesium, otherwise over the counter magnesium gluconate 400mg  is a good option 2) B vitamins 3) vitamin D 4) potassium 5) CoQ10 6) L-arginine 7) Vitamin C 8) Beetroot -Educated that goal BP is  120/80. -Made goal to incorporate some of the above foods into diet.    5) Short temper: -discussed neuropsych follow-up -discussed positive meditation  6) Knee buckling: -advised knee sleeve -discussed quad strengthening with PT -discussed leg lifts while sitting  7) Active smoker:  -discussed stopping cold [place]

## 2023-05-17 ENCOUNTER — Telehealth: Payer: Self-pay

## 2023-05-17 NOTE — Telephone Encounter (Signed)
(  Key: [id number]) PA Case ID #: [id number] Submitted for Nifedipine

## 2023-05-17 NOTE — Telephone Encounter (Signed)
 Your request has been approved Effective Date: 03/23/2023 Authorization Expiration Date: 05/16/2024

## 2023-06-06 ENCOUNTER — Telehealth: Payer: Self-pay | Admitting: Physical Medicine and Rehabilitation

## 2023-06-06 NOTE — Telephone Encounter (Signed)
 Woman from [employer] called and asked if someone can call the pt for medicine adherance

## 2023-06-07 ENCOUNTER — Encounter: Attending: Physical Medicine and Rehabilitation | Admitting: Physical Medicine and Rehabilitation

## 2023-06-07 DIAGNOSIS — Z91148 Patient's other noncompliance with medication regimen for other reason: Secondary | ICD-10-CM | POA: Insufficient documentation

## 2023-06-07 NOTE — Progress Notes (Signed)
Unable to leave VM as mailbox is full.

## 2023-07-19 ENCOUNTER — Ambulatory Visit (INDEPENDENT_AMBULATORY_CARE_PROVIDER_SITE_OTHER): Payer: Medicare Other | Admitting: Internal Medicine

## 2023-07-19 ENCOUNTER — Encounter: Payer: Self-pay | Admitting: Internal Medicine

## 2023-07-19 VITALS — BP 140/82 | HR 67 | Temp 97.5°F | Ht 65.0 in | Wt 166.2 lb

## 2023-07-19 DIAGNOSIS — E669 Obesity, unspecified: Secondary | ICD-10-CM | POA: Diagnosis not present

## 2023-07-19 DIAGNOSIS — E034 Atrophy of thyroid (acquired): Secondary | ICD-10-CM | POA: Diagnosis not present

## 2023-07-19 DIAGNOSIS — F0670 Mild neurocognitive disorder due to known physiological condition without behavioral disturbance: Secondary | ICD-10-CM

## 2023-07-19 DIAGNOSIS — E6609 Other obesity due to excess calories: Secondary | ICD-10-CM

## 2023-07-19 DIAGNOSIS — I10 Essential (primary) hypertension: Secondary | ICD-10-CM

## 2023-07-19 DIAGNOSIS — Z8673 Personal history of transient ischemic attack (TIA), and cerebral infarction without residual deficits: Secondary | ICD-10-CM

## 2023-07-19 DIAGNOSIS — E1169 Type 2 diabetes mellitus with other specified complication: Secondary | ICD-10-CM | POA: Diagnosis not present

## 2023-07-19 DIAGNOSIS — F39 Unspecified mood [affective] disorder: Secondary | ICD-10-CM | POA: Insufficient documentation

## 2023-07-19 DIAGNOSIS — I2510 Atherosclerotic heart disease of native coronary artery without angina pectoris: Secondary | ICD-10-CM

## 2023-07-19 DIAGNOSIS — R609 Edema, unspecified: Secondary | ICD-10-CM

## 2023-07-19 DIAGNOSIS — Z6830 Body mass index (BMI) 30.0-30.9, adult: Secondary | ICD-10-CM

## 2023-07-19 DIAGNOSIS — E66811 Obesity, class 1: Secondary | ICD-10-CM

## 2023-07-19 LAB — COMPREHENSIVE METABOLIC PANEL WITH GFR
ALT: 12 U/L (ref 0–53)
AST: 13 U/L (ref 0–37)
Albumin: 4.3 g/dL (ref 3.5–5.2)
Alkaline Phosphatase: 92 U/L (ref 39–117)
BUN: 10 mg/dL (ref 6–23)
CO2: 32 meq/L (ref 19–32)
Calcium: 10.6 mg/dL — ABNORMAL HIGH (ref 8.4–10.5)
Chloride: 101 meq/L (ref 96–112)
Creatinine, Ser: 0.96 mg/dL (ref 0.40–1.50)
GFR: 82.08 mL/min (ref >60.00)
Glucose, Bld: 245 mg/dL — ABNORMAL HIGH (ref 70–99)
Potassium: 3.9 meq/L (ref 3.5–5.1)
Sodium: 141 meq/L (ref 135–145)
Total Bilirubin: 0.5 mg/dL (ref 0.2–1.2)
Total Protein: 7.1 g/dL (ref 6.0–8.3)

## 2023-07-19 LAB — CBC WITH DIFFERENTIAL/PLATELET
Basophils Absolute: 0.1 10*3/uL (ref 0.0–0.1)
Basophils Relative: 0.9 % (ref 0.0–3.0)
Eosinophils Absolute: 0.2 10*3/uL (ref 0.0–0.7)
Eosinophils Relative: 2.2 % (ref 0.0–5.0)
HCT: 48 % (ref 39.0–52.0)
Hemoglobin: 16.6 g/dL (ref 13.0–17.0)
Lymphocytes Relative: 23.9 % (ref 12.0–46.0)
Lymphs Abs: 2.4 10*3/uL (ref 0.7–4.0)
MCHC: 34.7 g/dL (ref 30.0–36.0)
MCV: 93 fl (ref 78.0–100.0)
Monocytes Absolute: 0.7 10*3/uL (ref 0.1–1.0)
Monocytes Relative: 6.4 % (ref 3.0–12.0)
Neutro Abs: 6.8 10*3/uL (ref 1.4–7.7)
Neutrophils Relative %: 66.6 % (ref 43.0–77.0)
Platelets: 199 10*3/uL (ref 150.0–400.0)
RBC: 5.16 Mil/uL (ref 4.22–5.81)
RDW: 13.1 % (ref 11.5–15.5)
WBC: 10.2 10*3/uL (ref 4.0–10.5)

## 2023-07-19 LAB — LIPID PANEL
Cholesterol: 210 mg/dL — ABNORMAL HIGH (ref 0–200)
HDL: 44.3 mg/dL (ref >39.00)
LDL Cholesterol: 129 mg/dL — ABNORMAL HIGH (ref 0–99)
NonHDL: 165.85
Total CHOL/HDL Ratio: 5
Triglycerides: 182 mg/dL — ABNORMAL HIGH (ref 0.0–149.0)
VLDL: 36.4 mg/dL (ref 0.0–40.0)

## 2023-07-19 LAB — TSH: TSH: 3.55 u[IU]/mL (ref 0.35–5.50)

## 2023-07-19 LAB — HEMOGLOBIN A1C: Hgb A1c MFr Bld: 12.6 % — ABNORMAL HIGH (ref 4.6–6.5)

## 2023-07-19 LAB — T4, FREE: Free T4: 1.59 ng/dL (ref 0.60–1.60)

## 2023-07-19 NOTE — Assessment & Plan Note (Signed)
-   On Seroquel 

## 2023-07-19 NOTE — Assessment & Plan Note (Signed)
 Anthony Woods lost a lot of wt Check labs

## 2023-07-19 NOTE — Assessment & Plan Note (Signed)
 Hewlett lost a lot of wt. His max wt was 290 lbs Wt Readings from Last 3 Encounters:  07/19/23 166 lb 3.2 oz (75.4 kg)  05/06/23 168 lb (76.2 kg)  02/22/23 173 lb (78.5 kg)

## 2023-07-19 NOTE — Progress Notes (Signed)
 Subjective:  Patient ID: Anthony Woods, male    DOB: 11/20/1955  Age: 68 y.o. MRN: [id number]  CC: Medical Management of Chronic Issues (6 Month Follow Up. Patient has most recently started walking without assistance. Has been doing well but patient's spouse does note them having some exhaustion)   HPI Anthony Woods presents for CVA, A fib, CAD f/u Off ASA Smoking 2 ppd now "I'm bored" Anthony Woods lost a lot of wt He is here w/wife  Planning to move to Senior living apartments  Outpatient Medications Prior to Visit  Medication Sig Dispense Refill   acetaminophen  (TYLENOL ) 325 MG tablet Take 1-2 tablets (325-650 mg total) by mouth every 6 (six) hours as needed for moderate pain or mild pain.     amiodarone  (PACERONE ) 200 MG tablet TAKE 1 TABLET (200 MG TOTAL) BY MOUTH DAILY. ANNUAL APPT DUE IN OCT MUST SEE PROVIDER FOR FUTURE REFILLS 90 tablet 3   apixaban  (ELIQUIS ) 5 MG TABS tablet Take 1 tablet (5 mg total) by mouth 2 (two) times daily. 60 tablet 6   Cyanocobalamin  (B-12) 500 MCG TABS TAKE 1 TABLET (500 MCG TOTAL) BY MOUTH DAILY. 90 tablet 3   furosemide  (LASIX ) 20 MG tablet Take 1 tablet (20 mg total) by mouth daily as needed. 30 tablet 3   irbesartan  (AVAPRO ) 150 MG tablet Take 1 tablet (150 mg total) by mouth daily. 30 tablet 11   levothyroxine  (SYNTHROID ) 175 MCG tablet TAKE 1 TABLET BY MOUTH DAILY AT 6 AM. 90 tablet 3   metoprolol  succinate (TOPROL -XL) 25 MG 24 hr tablet Take 0.5 tablets (12.5 mg total) by mouth daily. 15 tablet 0   NIFEdipine  (PROCARDIA ) 20 MG capsule Take 1 capsule (20 mg total) by mouth daily. 90 capsule 3   nitroGLYCERIN  (NITROSTAT ) 0.4 MG SL tablet Place 1 tablet (0.4 mg total) under the tongue every 5 (five) minutes as needed for chest pain. 20 tablet 3   QUEtiapine  (SEROQUEL ) 50 MG tablet TAKE 1 TABLET (50 MG TOTAL) BY MOUTH AT BEDTIME. ANNUAL APPT DUE IN OCT MUST SEE PROVIDER FOR FUTURE REFILLS 90 tablet 1   repaglinide  (PRANDIN ) 1 MG tablet Take 1 tablet (1 mg  total) by mouth 3 (three) times daily before meals. 90 tablet 5   rosuvastatin  (CRESTOR ) 40 MG tablet TAKE 1 TABLET BY MOUTH DAILY. 90 tablet 0   traZODone  (DESYREL ) 150 MG tablet TAKE 1/2 TABLET (75 MG TOTAL) BY MOUTH AT BEDTIME. 45 tablet 3   acarbose  (PRECOSE ) 50 MG tablet TAKE 1 TABLET BY MOUTH 3 TIMES DAILY WITH MEALS. ANNUAL APPT DUE IN OCT MUST SEE PROVIDER FOR FUTURE REFILLS 90 tablet 3   apixaban  (ELIQUIS ) 5 MG TABS tablet Take 1 tablet (5 mg total) by mouth 2 (two) times daily. 28 tablet 0   ezetimibe  (ZETIA ) 10 MG tablet Take 1 tablet (10 mg total) by mouth daily. 30 tablet 0   melatonin 3 MG TABS tablet Take 1 tablet (3 mg total) by mouth at bedtime. 30 tablet 0   Continuous Glucose Receiver (FREESTYLE LIBRE 3 READER) DEVI 1 each by Does not apply route daily. (Patient not taking: Reported on 07/19/2023) 1 each 3   Continuous Glucose Sensor (FREESTYLE LIBRE 3 SENSOR) MISC 1 each by Does not apply route daily. Place 1 sensor on the skin every 14 days. Use to check glucose continuously (Patient not taking: Reported on 07/19/2023) 2 each 3   diphenhydrAMINE  (BENADRYL ) 25 mg capsule Take 25 mg by mouth as needed (bee  stings). (Patient not taking: Reported on 07/19/2023)     EPINEPHrine  (EPIPEN  2-PAK) 0.3 mg/0.3 mL IJ SOAJ injection Inject 0.3 mg into the muscle as needed for anaphylaxis. (Patient not taking: Reported on 07/19/2023) 2 each 1   metFORMIN  (GLUCOPHAGE ) 1000 MG tablet Take 1 tablet (1,000 mg total) by mouth 2 (two) times daily with a meal. (Patient not taking: Reported on 07/19/2023) 60 tablet 0   No facility-administered medications prior to visit.    ROS: Review of Systems  Constitutional:  Negative for appetite change, fatigue and unexpected weight change.  HENT:  Negative for congestion, nosebleeds, sneezing, sore throat and trouble swallowing.   Eyes:  Negative for itching and visual disturbance.  Respiratory:  Negative for cough.   Cardiovascular:  Negative for chest pain,  palpitations and leg swelling.  Gastrointestinal:  Negative for abdominal distention, blood in stool, diarrhea and nausea.  Genitourinary:  Negative for frequency and hematuria.  Musculoskeletal:  Positive for gait problem. Negative for back pain, joint swelling and neck pain.  Skin:  Negative for rash.  Neurological:  Positive for weakness. Negative for dizziness, tremors and speech difficulty.  Psychiatric/Behavioral:  Positive for decreased concentration and dysphoric mood. Negative for agitation, sleep disturbance and suicidal ideas. The patient is not nervous/anxious.     Objective:  BP (!) 140/82   Pulse 67   Temp (!) 97.5 F (36.4 C)   Ht 5\' 5"  (1.651 m)   Wt 166 lb 3.2 oz (75.4 kg)   SpO2 99%   BMI 27.66 kg/m   BP Readings from Last 3 Encounters:  07/19/23 (!) 140/82  05/06/23 (!) 180/80  02/22/23 130/60    Wt Readings from Last 3 Encounters:  07/19/23 166 lb 3.2 oz (75.4 kg)  05/06/23 168 lb (76.2 kg)  02/22/23 173 lb (78.5 kg)    Physical Exam Constitutional:      General: He is not in acute distress.    Appearance: Normal appearance. He is well-developed.     Comments: NAD  Eyes:     Conjunctiva/sclera: Conjunctivae normal.     Pupils: Pupils are equal, round, and reactive to light.  Neck:     Thyroid : No thyromegaly.     Vascular: No JVD.  Cardiovascular:     Rate and Rhythm: Normal rate and regular rhythm.     Heart sounds: Normal heart sounds. No murmur heard.    No friction rub. No gallop.  Pulmonary:     Effort: Pulmonary effort is normal. No respiratory distress.     Breath sounds: Normal breath sounds. No wheezing or rales.  Chest:     Chest wall: No tenderness.  Abdominal:     General: Bowel sounds are normal. There is no distension.     Palpations: Abdomen is soft. There is no mass.     Tenderness: There is no abdominal tenderness. There is no guarding or rebound.  Musculoskeletal:        General: No tenderness. Normal range of motion.      Cervical back: Normal range of motion.     Right lower leg: No edema.     Left lower leg: No edema.  Lymphadenopathy:     Cervical: No cervical adenopathy.  Skin:    General: Skin is warm and dry.     Findings: No rash.  Neurological:     Mental Status: He is alert and oriented to person, place, and time.     Cranial Nerves: No cranial nerve deficit.  Motor: No abnormal muscle tone.     Coordination: Coordination normal.     Gait: Gait normal.     Deep Tendon Reflexes: Reflexes are normal and symmetric.  Psychiatric:        Behavior: Behavior normal.        Thought Content: Thought content normal.        Judgment: Judgment normal.   Ataxic, no cane In NSR  Lab Results  Component Value Date   WBC 7.3 10/18/2022   HGB 12.9 (L) 10/18/2022   HCT 38.6 (L) 10/18/2022   PLT 191.0 10/18/2022   GLUCOSE 277 (H) 01/18/2023   CHOL 197 08/13/2022   TRIG 109 08/13/2022   HDL 47 08/13/2022   LDLDIRECT 100.0 12/09/2014   LDLCALC 128 (H) 08/13/2022   ALT 114 (H) 01/18/2023   AST 38 (H) 01/18/2023   NA 141 01/18/2023   K 4.5 01/18/2023   CL 103 01/18/2023   CREATININE 1.14 01/18/2023   BUN 16 01/18/2023   CO2 31 01/18/2023   TSH 3.060 12/10/2022   PSA 0.41 09/14/2017   INR 1.0 08/10/2022   HGBA1C 9.6 (H) 01/18/2023   MICROALBUR 4.7 (H) 12/06/2013    No results found.  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     Follow-up: Return in about 3 months (around 10/18/2023) for a follow-up visit.  Anitra Barn, MD

## 2023-07-19 NOTE — Assessment & Plan Note (Signed)
 Anthony Woods lost a lot of wt

## 2023-07-19 NOTE — Assessment & Plan Note (Signed)
Cont w/Levothroid  Labs

## 2023-07-19 NOTE — Assessment & Plan Note (Signed)
Post CVA °

## 2023-07-19 NOTE — Assessment & Plan Note (Signed)
 Smoking 2 ppd now "I'm bored" - discussed

## 2023-07-19 NOTE — Assessment & Plan Note (Signed)
 Planning to move to Senior living apartments

## 2023-07-19 NOTE — Assessment & Plan Note (Signed)
 No relapse Lasix  prn

## 2023-07-20 ENCOUNTER — Ambulatory Visit

## 2023-07-20 VITALS — Ht 65.0 in | Wt 166.0 lb

## 2023-07-20 DIAGNOSIS — E1169 Type 2 diabetes mellitus with other specified complication: Secondary | ICD-10-CM

## 2023-07-20 DIAGNOSIS — F17210 Nicotine dependence, cigarettes, uncomplicated: Secondary | ICD-10-CM

## 2023-07-20 DIAGNOSIS — E669 Obesity, unspecified: Secondary | ICD-10-CM

## 2023-07-20 DIAGNOSIS — Z0000 Encounter for general adult medical examination without abnormal findings: Secondary | ICD-10-CM

## 2023-07-20 DIAGNOSIS — Z1211 Encounter for screening for malignant neoplasm of colon: Secondary | ICD-10-CM

## 2023-07-20 NOTE — Patient Instructions (Signed)
 Anthony Woods , Thank you for taking time to come for your Medicare Wellness Visit. I appreciate your ongoing commitment to your health goals. Please review the following plan we discussed and let me know if I can assist you in the future.   Referrals/Orders/Follow-Ups/Clinician Recommendations: It was nice talking with you today.  You are due for a pneumonia vaccine and can get that at your pharmacy.  You are also due for a kidney evaluation and a foot exam and can get that done during your next office visit.   Remember that you are due for a diabetic eye exam, so please get scheduled with your eye doctor soon.   You are due for a colonoscopy, please call [facility], [phone], to get scheduled for a colonoscopy.    This is a list of the screening recommended for you and due dates:  Health Maintenance  Topic Date Due   Medicare Annual Wellness Visit  Never done   Screening for Lung Cancer  Never done   Yearly kidney health urinalysis for diabetes  12/07/2014   Complete foot exam   03/27/2015   Eye exam for diabetics  04/23/2016   COVID-19 Vaccine (3 - Pfizer risk series) 07/30/2019   Colon Cancer Screening  03/31/2021   Pneumonia Vaccine (3 of 3 - PCV20 or PCV21) 09/14/2021   Flu Shot  10/21/2023   Hemoglobin A1C  01/18/2024   Yearly kidney function blood test for diabetes  07/18/2024   DTaP/Tdap/Td vaccine (3 - Td or Tdap) 09/15/2027   Hepatitis C Screening  Completed   Zoster (Shingles) Vaccine  Completed   HPV Vaccine  Aged Out   Meningitis B Vaccine  Aged Out    Advanced directives: (In Chart) A copy of your advanced directives are scanned into your chart should your provider ever need it.  Next Medicare Annual Wellness Visit scheduled for next year: Yes

## 2023-07-20 NOTE — Progress Notes (Addendum)
 Subjective:   Anthony Woods is a 68 y.o. who presents for a Medicare Wellness preventive visit.  Visit Complete: Virtual I connected with  Anthony Woods on 07/20/23 by a audio enabled telemedicine application and verified that I am speaking with the correct person using two identifiers.  Patient Location: Home  Provider Location: Home Office  I discussed the limitations of evaluation and management by telemedicine. The patient expressed understanding and agreed to proceed.  Vital Signs: Because this visit was a virtual/telehealth visit, some criteria may be missing or patient reported. Any vitals not documented were not able to be obtained and vitals that have been documented are patient reported.  VideoDeclined- This patient declined Librarian, academic. Therefore the visit was completed with audio only.  Persons Participating in Visit: Patient.  AWV Questionnaire: No: Patient Medicare AWV questionnaire was not completed prior to this visit.  Cardiac Risk Factors include: advanced age (>24men, >73 women);hypertension;male gender;Other (see comment);diabetes mellitus;dyslipidemia, Risk factor comments: A-Fib, Coronary atherosclerosis, Acute embolic stroke, COPD     Objective:    Today's Vitals   07/20/23 0816  Weight: 166 lb (75.3 kg)  Height: 5\' 5"  (1.651 m)   Body mass index is 27.62 kg/m.     07/20/2023    8:30 AM 09/22/2022   11:00 AM 09/22/2022    8:43 AM 09/22/2022    8:37 AM 09/22/2022    4:49 AM 08/13/2022    3:20 PM 09/28/2018    7:16 AM  Advanced Directives  Does Patient Have a Medical Advance Directive? Yes Yes Yes Yes Yes No No  Type of Estate agent of [place];Living will Out of facility DNR (pink MOST or yellow form) Out of facility DNR (pink MOST or yellow form) Out of facility DNR (pink MOST or yellow form) Out of facility DNR (pink MOST or yellow form)    Does patient want to make changes to medical advance  directive? No - Patient declined No - Guardian declined No - Patient declined No - Patient declined     Copy of Healthcare Power of Attorney in Chart? Yes - validated most recent copy scanned in chart (See row information)        Would patient like information on creating a medical advance directive?  No - Patient declined No - Patient declined   No - Patient declined No - Patient declined  Pre-existing out of facility DNR order (yellow form or pink MOST form)  Yellow form placed in chart (order not valid for inpatient use) Pink Most/Yellow Form available - Physician notified to receive inpatient order        Current Medications (verified) Outpatient Encounter Medications as of 07/20/2023  Medication Sig   acetaminophen  (TYLENOL ) 325 MG tablet Take 1-2 tablets (325-650 mg total) by mouth every 6 (six) hours as needed for moderate pain or mild pain.   amiodarone  (PACERONE ) 200 MG tablet TAKE 1 TABLET (200 MG TOTAL) BY MOUTH DAILY. ANNUAL APPT DUE IN OCT MUST SEE PROVIDER FOR FUTURE REFILLS   apixaban  (ELIQUIS ) 5 MG TABS tablet Take 1 tablet (5 mg total) by mouth 2 (two) times daily.   Cyanocobalamin  (B-12) 500 MCG TABS TAKE 1 TABLET (500 MCG TOTAL) BY MOUTH DAILY.   diphenhydrAMINE  (BENADRYL ) 25 mg capsule Take 25 mg by mouth as needed (bee stings).   EPINEPHrine  (EPIPEN  2-PAK) 0.3 mg/0.3 mL IJ SOAJ injection Inject 0.3 mg into the muscle as needed for anaphylaxis.   furosemide  (LASIX ) 20 MG  tablet Take 1 tablet (20 mg total) by mouth daily as needed.   irbesartan  (AVAPRO ) 150 MG tablet Take 1 tablet (150 mg total) by mouth daily.   levothyroxine  (SYNTHROID ) 175 MCG tablet TAKE 1 TABLET BY MOUTH DAILY AT 6 AM.   metoprolol  succinate (TOPROL -XL) 25 MG 24 hr tablet Take 0.5 tablets (12.5 mg total) by mouth daily.   NIFEdipine  (PROCARDIA ) 20 MG capsule Take 1 capsule (20 mg total) by mouth daily.   nitroGLYCERIN  (NITROSTAT ) 0.4 MG SL tablet Place 1 tablet (0.4 mg total) under the tongue every 5 (five)  minutes as needed for chest pain.   QUEtiapine  (SEROQUEL ) 50 MG tablet TAKE 1 TABLET (50 MG TOTAL) BY MOUTH AT BEDTIME. ANNUAL APPT DUE IN OCT MUST SEE PROVIDER FOR FUTURE REFILLS   repaglinide  (PRANDIN ) 1 MG tablet Take 1 tablet (1 mg total) by mouth 3 (three) times daily before meals.   rosuvastatin  (CRESTOR ) 40 MG tablet TAKE 1 TABLET BY MOUTH DAILY.   traZODone  (DESYREL ) 150 MG tablet TAKE 1/2 TABLET (75 MG TOTAL) BY MOUTH AT BEDTIME.   Continuous Glucose Receiver (FREESTYLE LIBRE 3 READER) DEVI 1 each by Does not apply route daily. (Patient not taking: Reported on 07/20/2023)   Continuous Glucose Sensor (FREESTYLE LIBRE 3 SENSOR) MISC 1 each by Does not apply route daily. Place 1 sensor on the skin every 14 days. Use to check glucose continuously (Patient not taking: Reported on 07/20/2023)   metFORMIN  (GLUCOPHAGE ) 1000 MG tablet Take 1 tablet (1,000 mg total) by mouth 2 (two) times daily with a meal. (Patient not taking: Reported on 07/20/2023)   No facility-administered encounter medications on file as of 07/20/2023.    Allergies (verified) Bee venom, Actos [pioglitazone hydrochloride], Lipitor [atorvastatin], and Lisinopril    History: Past Medical History:  Diagnosis Date   Acute embolic stroke (HCC) 08/13/2022   Allergy to bee sting    Anxiety    Arthritis    "right knee" (05/12/2016)   CAD (coronary artery disease)    a. DES to LCx 04/2016. b. DES to prox Cx 08/2017. c. Stable cath 09/2018 with nonobstructive residual disease, normal EF.   Carotid artery disease (HCC)    Headache    "when his sugar goes too high or too low" (05/12/2016)   HTN (hypertension)    Hyperlipidemia    Hypothyroidism    Macular degeneration    Obesity    PAF (paroxysmal atrial fibrillation) (HCC)    Type II diabetes mellitus (HCC)    Past Surgical History:  Procedure Laterality Date   CARDIAC CATHETERIZATION     COLONOSCOPY     CORONARY ANGIOPLASTY WITH STENT PLACEMENT  05/12/2016   CORONARY STENT  INTERVENTION N/A 05/12/2016   Procedure: Coronary Stent Intervention;  Surgeon: Lucendia Rusk, MD;  Location: [facility] INVASIVE CV LAB;  Service: Cardiovascular;  Laterality: N/A;   CORONARY STENT INTERVENTION N/A 08/31/2017   Procedure: CORONARY STENT INTERVENTION;  Surgeon: Lucendia Rusk, MD;  Location: [facility] INVASIVE CV LAB;  Service: Cardiovascular;  Laterality: N/A;   GANGLION CYST EXCISION Left 1980s   KNEE ARTHROSCOPY Right    Meniscus removal   LEFT HEART CATH AND CORONARY ANGIOGRAPHY N/A 05/12/2016   Procedure: Left Heart Cath and Coronary Angiography;  Surgeon: Lucendia Rusk, MD;  Location: [facility] INVASIVE CV LAB;  Service: Cardiovascular;  Laterality: N/A;   LEFT HEART CATH AND CORONARY ANGIOGRAPHY N/A 08/31/2017   Procedure: LEFT HEART CATH AND CORONARY ANGIOGRAPHY;  Surgeon: Lucendia Rusk, MD;  Location:  [facility] INVASIVE CV LAB;  Service: Cardiovascular;  Laterality: N/A;   LEFT HEART CATHETERIZATION WITH CORONARY ANGIOGRAM N/A 02/04/2012   Procedure: LEFT HEART CATHETERIZATION WITH CORONARY ANGIOGRAM;  Surgeon: Odie Benne, MD;  Location: [facility] CATH LAB;  Service: Cardiovascular;  Laterality: N/A;   POLYPECTOMY     RIGHT/LEFT HEART CATH AND CORONARY ANGIOGRAPHY N/A 09/28/2018   Procedure: RIGHT/LEFT HEART CATH AND CORONARY ANGIOGRAPHY;  Surgeon: Odie Benne, MD;  Location: [facility] INVASIVE CV LAB;  Service: Cardiovascular;  Laterality: N/A;   Family History  Problem Relation Age of Onset   Diabetes Mother    Cancer Mother        male ca   Colon cancer Mother 17   Diabetes Father    Cancer Father        stomach   CVA Father    Stomach cancer Father    Breast cancer Sister    Diabetes Sister    CAD Maternal Grandmother    CVA Paternal Uncle    Esophageal cancer Neg Hx    Rectal cancer Neg Hx    Social History   Socioeconomic History   Marital status: Married    Spouse name: Debbie   Number of children: 1   Years of education: Not on file    Highest education level: Not on file  Occupational History   Occupation: RETIRED/[employer]   Occupation: GRAVE DIGGER    Employer: [employer]    Comment: retired  Tobacco Use   Smoking status: Every Day    Current packs/day: 3.00    Average packs/day: 3.0 packs/day for 42.0 years (126.0 ttl pk-yrs)    Types: Cigarettes   Smokeless tobacco: Never   Tobacco comments:    Currently slightly less than 1 ppd  Vaping Use   Vaping status: Never Used  Substance and Sexual Activity   Alcohol use: Not Currently   Drug use: No   Sexual activity: Not Currently  Other Topics Concern   Not on file  Social History Narrative   Lives with wife/2025   Social Drivers of Health   Financial Resource Strain: Medium Risk (07/20/2023)   Overall Financial Resource Strain (CARDIA)    Difficulty of Paying Living Expenses: Somewhat hard  Food Insecurity: No Food Insecurity (07/20/2023)   Hunger Vital Sign    Worried About Running Out of Food in the Last Year: Never true    Ran Out of Food in the Last Year: Never true  Transportation Needs: No Transportation Needs (07/20/2023)   PRAPARE - Administrator, Civil Service (Medical): No    Lack of Transportation (Non-Medical): No  Physical Activity: Inactive (07/20/2023)   Exercise Vital Sign    Days of Exercise per Week: 0 days    Minutes of Exercise per Session: 0 min  Stress: No Stress Concern Present (07/20/2023)   [employer] of Occupational Health - Occupational Stress Questionnaire    Feeling of Stress : Not at all  Social Connections: Socially Isolated (07/20/2023)   Social Connection and Isolation Panel [NHANES]    Frequency of Communication with Friends and Family: Once a week    Frequency of Social Gatherings with Friends and Family: Never    Attends Religious Services: Never    Database administrator or Organizations: No    Attends Banker Meetings: Never    Marital Status: Married    Tobacco  Counseling Ready to quit: Not Answered Counseling given: Not Answered Tobacco comments:  Currently slightly less than 1 ppd    Clinical Intake:  Pre-visit preparation completed: Yes  Pain : No/denies pain     BMI - recorded: 27.62 Nutritional Status: BMI 25 -29 Overweight Nutritional Risks: None Diabetes: Yes CBG done?: No Did pt. bring in CBG monitor from home?: No  Lab Results  Component Value Date   HGBA1C 12.6 (H) 07/19/2023   HGBA1C 9.6 (H) 01/18/2023   HGBA1C 9.9 (H) 10/18/2022     How often do you need to have someone help you when you read instructions, pamphlets, or other written materials from your doctor or pharmacy?: 3 - Sometimes  Interpreter Needed?: No  Information entered by :: Rasheed Welty, RMA   Activities of Daily Living     07/20/2023    8:18 AM 09/22/2022   11:00 AM  In your present state of health, do you have any difficulty performing the following activities:  Hearing? 0 0  Vision? 0 0  Difficulty concentrating or making decisions? 0 1  Walking or climbing stairs? 0 1  Dressing or bathing? 0 1  Doing errands, shopping? 0 0  Comment wife drives for him   Preparing Food and eating ? N   Using the Toilet? N   In the past six months, have you accidently leaked urine? N   Do you have problems with loss of bowel control? N   Managing your Medications? N   Managing your Finances? N   Housekeeping or managing your Housekeeping? N     Patient Care Team: Plotnikov, Oakley Bellman, MD as PCP - General Abel Hoe Coy Ditty, MD as PCP - Cardiology (Cardiology) Odie Benne, MD as Attending Physician (Cardiology) Tasia Farr, MD as Consulting Physician (Endocrinology) Odie Benne, MD as Consulting Physician (Cardiology) [facility]  Indicate any recent Medical Services you may have received from other than [facility] providers in the past year (date may be approximate).     Assessment:   This is a routine  wellness examination for [employer].  Hearing/Vision screen Hearing Screening - Comments:: Denies hearing difficulties   Vision Screening - Comments:: Denies vision issues.    Goals Addressed             This Visit's Progress    Patient Stated       Not at this time       Depression Screen  NO CHANGES FROM YESTERDAY, 07/19/23     07/19/2023    8:47 AM 02/04/2023   10:59 AM 11/05/2022   11:41 AM 10/18/2022    2:34 PM 10/04/2019    8:07 AM 12/20/2017    8:44 AM 10/06/2016    2:06 PM  PHQ 2/9 Scores  PHQ - 2 Score 2 0 6 0 0 0 0  PHQ- 9 Score 4  11        Fall Risk     07/20/2023    8:30 AM 07/19/2023    8:47 AM 05/06/2023   11:50 AM 02/04/2023   10:59 AM 11/05/2022   11:41 AM  Fall Risk   Falls in the past year? 1 1 1  0 1  Number falls in past yr: 1 0 1 0 1  Injury with Fall? 0 0  0 0  Risk for fall due to : History of fall(s) Impaired mobility     Follow up Falls evaluation completed;Falls prevention discussed Falls evaluation completed       MEDICARE RISK AT HOME:  Medicare Risk at Home Any stairs  in or around the home?: Yes (outside front and back) If so, are there any without handrails?: Yes Home free of loose throw rugs in walkways, pet beds, electrical cords, etc?: Yes Adequate lighting in your home to reduce risk of falls?: Yes Life alert?: No Use of a cane, walker or w/c?: No Grab bars in the bathroom?: Yes Shower chair or bench in shower?: Yes Elevated toilet seat or a handicapped toilet?: Yes  TIMED UP AND GO:  Was the test performed?  No  Cognitive Function: Declined/Normal: No cognitive concerns noted by patient or family. Patient alert, oriented, able to answer questions appropriately and recall recent events. No signs of memory loss or confusion.        Immunizations Immunization History  Administered Date(s) Administered   Influenza Split 01/06/2012   Influenza Whole 12/05/2009   Influenza, Quadrivalent, Recombinant, Inj, Pf 01/02/2020    Influenza,inj,Quad PF,6+ Mos 02/06/2013, 12/06/2013, 12/09/2014, 12/15/2015, 12/15/2016, 12/13/2018, 12/29/2020   PFIZER(Purple Top)SARS-COV-2 Vaccination 06/11/2019, 07/02/2019   Pneumococcal Conjugate-13 09/14/2016   Pneumococcal Polysaccharide-23 05/24/2013   Td 12/06/2007   Tdap 09/14/2017   Zoster Recombinant(Shingrix ) 01/18/2017, 03/16/2017    Screening Tests Health Maintenance  Topic Date Due   Lung Cancer Screening  Never done   Diabetic kidney evaluation - Urine ACR  12/07/2014   FOOT EXAM  03/27/2015   OPHTHALMOLOGY EXAM  04/23/2016   COVID-19 Vaccine (3 - Pfizer risk series) 07/30/2019   Colonoscopy  03/31/2021   Pneumonia Vaccine 51+ Years old (3 of 3 - PCV20 or PCV21) 09/14/2021   INFLUENZA VACCINE  10/21/2023   HEMOGLOBIN A1C  01/18/2024   Diabetic kidney evaluation - eGFR measurement  07/18/2024   Medicare Annual Wellness (AWV)  07/19/2024   DTaP/Tdap/Td (3 - Td or Tdap) 09/15/2027   Hepatitis C Screening  Completed   Zoster Vaccines- Shingrix   Completed   HPV VACCINES  Aged Out   Meningococcal B Vaccine  Aged Out    Health Maintenance  Health Maintenance Due  Topic Date Due   Lung Cancer Screening  Never done   Diabetic kidney evaluation - Urine ACR  12/07/2014   FOOT EXAM  03/27/2015   OPHTHALMOLOGY EXAM  04/23/2016   COVID-19 Vaccine (3 - Pfizer risk series) 07/30/2019   Colonoscopy  03/31/2021   Pneumonia Vaccine 52+ Years old (3 of 3 - PCV20 or PCV21) 09/14/2021   Health Maintenance Items Addressed: Referral sent to GI for colonoscopy, Lung Cancer Screening ordered, Diabetic Foot Exam scheduled, UACR (Urine Albumin:Creatinine Ratio), See Nurse Notes  Additional Screening:  Vision Screening: Recommended annual ophthalmology exams for early detection of glaucoma and other disorders of the eye.  Dental Screening: Recommended annual dental exams for proper oral hygiene  Community Resource Referral / Chronic Care Management: CRR required this visit?   No   CCM required this visit?  No     Plan:     I have personally reviewed and noted the following in the patient's chart:   Medical and social history Use of alcohol, tobacco or illicit drugs  Current medications and supplements including opioid prescriptions. Patient is not currently taking opioid prescriptions. Functional ability and status Nutritional status Physical activity Advanced directives List of other physicians Hospitalizations, surgeries, and ER visits in previous 12 months Vitals Screenings to include cognitive, depression, and falls Referrals and appointments  In addition, I have reviewed and discussed with patient certain preventive protocols, quality metrics, and best practice recommendations. A written personalized care plan for preventive services as well  as general preventive health recommendations were provided to patient.     Cambri Plourde L Zeriah Baysinger, CMA   07/20/2023   After Visit Summary: (MyChart) Due to this being a telephonic visit, the after visit summary with patients personalized plan was offered to patient via MyChart   Notes: Please refer to Routing Comments.  Medical screening examination/treatment/procedure(s) were performed by non-physician practitioner and as supervising physician I was immediately available for consultation/collaboration.  I agree with above. Adelaide Holy, MD

## 2023-07-21 ENCOUNTER — Other Ambulatory Visit: Payer: Self-pay | Admitting: Internal Medicine

## 2023-07-21 ENCOUNTER — Encounter: Payer: Self-pay | Admitting: Internal Medicine

## 2023-07-21 MED ORDER — REPAGLINIDE 2 MG PO TABS: 2.0000 mg | ORAL_TABLET | Freq: Three times a day (TID) | ORAL | 11 refills | Status: DC

## 2023-08-01 ENCOUNTER — Other Ambulatory Visit: Payer: Self-pay | Admitting: Internal Medicine

## 2023-08-02 ENCOUNTER — Encounter
Payer: No Typology Code available for payment source | Attending: Physical Medicine and Rehabilitation | Admitting: Physical Medicine and Rehabilitation

## 2023-08-02 DIAGNOSIS — Z91148 Patient's other noncompliance with medication regimen for other reason: Secondary | ICD-10-CM | POA: Insufficient documentation

## 2023-08-03 ENCOUNTER — Encounter: Payer: Self-pay | Admitting: Emergency Medicine

## 2023-08-11 ENCOUNTER — Other Ambulatory Visit: Payer: Self-pay | Admitting: Internal Medicine

## 2023-08-11 ENCOUNTER — Other Ambulatory Visit: Payer: Self-pay | Admitting: Cardiovascular Disease

## 2023-08-12 ENCOUNTER — Telehealth: Payer: Self-pay

## 2023-08-12 NOTE — Telephone Encounter (Signed)
 This patient is appearing on a report for being at risk of failing the adherence measure for hypertension (ACEi/ARB) medications this calendar year.   Medication: irbesartan  150 mg Last fill date: 07/26/23 for 30 day supply  Insurance report was not up to date. No action needed at this time.   Abelina Abide, PharmD PGY1 Pharmacy Resident 08/12/2023 2:02 PM

## 2023-08-19 ENCOUNTER — Encounter: Payer: Self-pay | Admitting: Internal Medicine

## 2023-09-08 ENCOUNTER — Telehealth: Payer: Self-pay | Admitting: Internal Medicine

## 2023-09-08 NOTE — Telephone Encounter (Unsigned)
 Copied from CRM [id number]. Topic: General - Other >> Sep 08, 2023  8:21 AM Kita Perish H wrote: Reason for CRM: [facility] If possible for office to assist with contacting patient to ensure he's taking the rosuvastatin  (CRESTOR ) 40 MG tablet properly and if needs any refills he must request them. Reg verified number on file same number they have.   [facility] [phone] Ext [phone]

## 2023-09-13 ENCOUNTER — Other Ambulatory Visit: Payer: Self-pay | Admitting: Nurse Practitioner

## 2023-09-13 NOTE — Telephone Encounter (Signed)
 Attempted to contact pt was unsuccessful

## 2023-09-29 ENCOUNTER — Other Ambulatory Visit: Payer: Self-pay | Admitting: Cardiovascular Disease

## 2023-10-03 DIAGNOSIS — R2681 Unsteadiness on feet: Secondary | ICD-10-CM | POA: Diagnosis not present

## 2023-10-03 DIAGNOSIS — Z008 Encounter for other general examination: Secondary | ICD-10-CM | POA: Diagnosis not present

## 2023-10-03 DIAGNOSIS — Z6841 Body Mass Index (BMI) 40.0 and over, adult: Secondary | ICD-10-CM | POA: Diagnosis not present

## 2023-10-03 DIAGNOSIS — F17210 Nicotine dependence, cigarettes, uncomplicated: Secondary | ICD-10-CM | POA: Diagnosis not present

## 2023-10-20 ENCOUNTER — Ambulatory Visit: Admitting: Internal Medicine

## 2023-10-21 ENCOUNTER — Telehealth: Payer: Self-pay | Admitting: Internal Medicine

## 2023-10-21 NOTE — Telephone Encounter (Signed)
 Copied from CRM #[id number]. Topic: Medical Record Request - Other >> Oct 21, 2023 10:33 AM Suzen RAMAN wrote: Reason for CRM: [facility] would like a call back to confirm receipt of diabetes enrollment note for patient  CB# [phone] [phone]

## 2023-11-11 ENCOUNTER — Other Ambulatory Visit: Payer: Self-pay | Admitting: Physical Medicine and Rehabilitation

## 2023-11-23 ENCOUNTER — Other Ambulatory Visit: Payer: Self-pay | Admitting: Internal Medicine

## 2023-11-23 NOTE — Telephone Encounter (Unsigned)
 Copied from CRM [id number]. Topic: Clinical - Medication Refill >> Nov 23, 2023 11:13 AM Chiquita SQUIBB wrote: Medication: rosuvastatin  rosuvastatin  (CRESTOR ) 40 MG tablet   Has the patient contacted their pharmacy? Yes- [name] from [facility] is calling on behalf of the patient  (Agent: If no, request that the patient contact the pharmacy for the refill. If patient does not wish to contact the pharmacy document the reason why and proceed with request.) (Agent: If yes, when and what did the pharmacy advise?)  This is the patient's preferred pharmacy:  [facility] - [place], [place] - [address] [place] [address] Phone: [phone] Fax: [phone]  Is this the correct pharmacy for this prescription? Yes If no, delete pharmacy and type the correct one.   Has the prescription been filled recently? No  Is the patient out of the medication? No  Has the patient been seen for an appointment in the last year OR does the patient have an upcoming appointment? Yes  Can we respond through MyChart? Yes  Agent: Please be advised that Rx refills may take up to 3 business days. We ask that you follow-up with your pharmacy.

## 2023-11-24 ENCOUNTER — Other Ambulatory Visit: Payer: Self-pay | Admitting: Nurse Practitioner

## 2023-12-15 ENCOUNTER — Ambulatory Visit (INDEPENDENT_AMBULATORY_CARE_PROVIDER_SITE_OTHER): Admitting: Internal Medicine

## 2023-12-15 ENCOUNTER — Encounter: Payer: Self-pay | Admitting: Internal Medicine

## 2023-12-15 VITALS — BP 178/86 | HR 58 | Resp 20 | Ht 65.0 in | Wt 169.2 lb

## 2023-12-15 DIAGNOSIS — F17200 Nicotine dependence, unspecified, uncomplicated: Secondary | ICD-10-CM

## 2023-12-15 DIAGNOSIS — E1169 Type 2 diabetes mellitus with other specified complication: Secondary | ICD-10-CM

## 2023-12-15 DIAGNOSIS — I480 Paroxysmal atrial fibrillation: Secondary | ICD-10-CM

## 2023-12-15 DIAGNOSIS — H543 Unqualified visual loss, both eyes: Secondary | ICD-10-CM | POA: Diagnosis not present

## 2023-12-15 DIAGNOSIS — Z23 Encounter for immunization: Secondary | ICD-10-CM | POA: Diagnosis not present

## 2023-12-15 DIAGNOSIS — E669 Obesity, unspecified: Secondary | ICD-10-CM

## 2023-12-15 DIAGNOSIS — R202 Paresthesia of skin: Secondary | ICD-10-CM

## 2023-12-15 DIAGNOSIS — Z91199 Patient's noncompliance with other medical treatment and regimen due to unspecified reason: Secondary | ICD-10-CM

## 2023-12-15 DIAGNOSIS — E034 Atrophy of thyroid (acquired): Secondary | ICD-10-CM

## 2023-12-15 NOTE — Assessment & Plan Note (Addendum)
 Unclear etiology x 2 wks R eye 20/200 L eye 20/100 Uncontrolled DM. Poor compliance Ophth ref

## 2023-12-15 NOTE — Progress Notes (Signed)
 Subjective:  Patient ID: Anthony Woods, male    DOB: Nov 28, 1955  Age: 68 y.o. MRN: [id number]  CC: Follow-up and Blurred Vision (X 2 weeks)   HPI Anthony Woods presents for Blurred Vision (X 2 weeks) F/u on HTN, CVA Poor historian He is here w/wife  Outpatient Medications Prior to Visit  Medication Sig Dispense Refill   acetaminophen  (TYLENOL ) 325 MG tablet Take 1-2 tablets (325-650 mg total) by mouth every 6 (six) hours as needed for moderate pain or mild pain.     amiodarone  (PACERONE ) 200 MG tablet TAKE 1 TABLET (200 MG TOTAL) BY MOUTH DAILY. ANNUAL APPT DUE IN OCT MUST SEE PROVIDER FOR FUTURE REFILLS 90 tablet 3   apixaban  (ELIQUIS ) 5 MG TABS tablet Take 1 tablet (5 mg total) by mouth 2 (two) times daily. 60 tablet 6   Continuous Glucose Receiver (FREESTYLE LIBRE 3 READER) DEVI 1 each by Does not apply route daily. 1 each 3   Continuous Glucose Sensor (FREESTYLE LIBRE 3 SENSOR) MISC 1 each by Does not apply route daily. Place 1 sensor on the skin every 14 days. Use to check glucose continuously 2 each 3   Cyanocobalamin  (B-12) 500 MCG TABS TAKE 1 TABLET (500 MCG TOTAL) BY MOUTH DAILY. 90 tablet 3   diphenhydrAMINE  (BENADRYL ) 25 mg capsule Take 25 mg by mouth as needed (bee stings).     EPINEPHrine  (EPIPEN  2-PAK) 0.3 mg/0.3 mL IJ SOAJ injection Inject 0.3 mg into the muscle as needed for anaphylaxis. 2 each 1   furosemide  (LASIX ) 20 MG tablet Take 1 tablet (20 mg total) by mouth daily as needed. 30 tablet 3   irbesartan  (AVAPRO ) 150 MG tablet Take 1 tablet (150 mg total) by mouth daily. 30 tablet 11   levothyroxine  (SYNTHROID ) 175 MCG tablet TAKE 1 TABLET BY MOUTH DAILY AT 6 AM. 90 tablet 3   metoprolol  succinate (TOPROL -XL) 25 MG 24 hr tablet Take 0.5 tablets (12.5 mg total) by mouth daily. 15 tablet 0   NIFEdipine  (PROCARDIA ) 20 MG capsule TAKE 1 CAPSULE (20 MG TOTAL) BY MOUTH DAILY. 90 capsule 3   nitroGLYCERIN  (NITROSTAT ) 0.4 MG SL tablet Place 1 tablet (0.4 mg total) under the  tongue every 5 (five) minutes as needed for chest pain. 20 tablet 3   QUEtiapine  (SEROQUEL ) 50 MG tablet TAKE 1 TABLET (50 MG TOTAL) BY MOUTH AT BEDTIME. 90 tablet 1   repaglinide  (PRANDIN ) 2 MG tablet Take 1 tablet (2 mg total) by mouth 3 (three) times daily before meals. 90 tablet 11   rosuvastatin  (CRESTOR ) 40 MG tablet TAKE 1 TABLET BY MOUTH DAILY. **NEED OFFICE VISIT FOR FUTURE REFILLS.** 90 tablet 0   traZODone  (DESYREL ) 150 MG tablet TAKE 1/2 TABLET (75 MG TOTAL) BY MOUTH AT BEDTIME. 45 tablet 3   metFORMIN  (GLUCOPHAGE ) 1000 MG tablet Take 1 tablet (1,000 mg total) by mouth 2 (two) times daily with a meal. (Patient not taking: Reported on 12/15/2023) 60 tablet 0   No facility-administered medications prior to visit.    ROS: Review of Systems  Constitutional:  Negative for appetite change, fatigue, fever and unexpected weight change.  HENT:  Negative for congestion, nosebleeds, sneezing, sore throat and trouble swallowing.   Eyes:  Positive for visual disturbance. Negative for pain and itching.  Respiratory:  Positive for cough.   Cardiovascular:  Negative for chest pain, palpitations and leg swelling.  Gastrointestinal:  Negative for abdominal distention, blood in stool, diarrhea and nausea.  Genitourinary:  Negative for frequency  and hematuria.  Musculoskeletal:  Positive for gait problem. Negative for back pain, joint swelling and neck pain.  Skin:  Negative for rash.  Neurological:  Positive for weakness. Negative for dizziness, tremors and speech difficulty.  Hematological:  Bruises/bleeds easily.  Psychiatric/Behavioral:  Positive for confusion and decreased concentration. Negative for agitation, dysphoric mood, sleep disturbance and suicidal ideas. The patient is not nervous/anxious.     Objective:  BP (!) 178/86 (BP Location: Left Arm, Patient Position: Sitting)   Pulse (!) 58   Resp 20   Ht 5' 5 (1.651 m)   Wt 169 lb 3.2 oz (76.7 kg)   SpO2 97%   BMI 28.16 kg/m   BP  Readings from Last 3 Encounters:  12/15/23 (!) 178/86  07/19/23 (!) 140/82  05/06/23 (!) 180/80    Wt Readings from Last 3 Encounters:  12/15/23 169 lb 3.2 oz (76.7 kg)  07/20/23 166 lb (75.3 kg)  07/19/23 166 lb 3.2 oz (75.4 kg)    Physical Exam Constitutional:      General: He is not in acute distress.    Appearance: He is well-developed.     Comments: NAD  Eyes:     Conjunctiva/sclera: Conjunctivae normal.     Pupils: Pupils are equal, round, and reactive to light.  Neck:     Thyroid : No thyromegaly.     Vascular: No JVD.  Cardiovascular:     Rate and Rhythm: Normal rate and regular rhythm.     Heart sounds: Normal heart sounds. No murmur heard.    No friction rub. No gallop.  Pulmonary:     Effort: Pulmonary effort is normal. No respiratory distress.     Breath sounds: Normal breath sounds. No wheezing or rales.  Chest:     Chest wall: No tenderness.  Abdominal:     General: Bowel sounds are normal. There is no distension.     Palpations: Abdomen is soft. There is no mass.     Tenderness: There is no abdominal tenderness. There is no guarding or rebound.  Musculoskeletal:        General: No tenderness. Normal range of motion.     Cervical back: Normal range of motion.     Right lower leg: No edema.     Left lower leg: No edema.  Lymphadenopathy:     Cervical: No cervical adenopathy.  Skin:    General: Skin is warm and dry.     Findings: No rash.  Neurological:     Mental Status: He is alert. Mental status is at baseline.     Cranial Nerves: No cranial nerve deficit.     Motor: Weakness present. No abnormal muscle tone.     Coordination: Coordination abnormal.     Gait: Gait abnormal.     Deep Tendon Reflexes: Reflexes are normal and symmetric.  Psychiatric:        Behavior: Behavior normal.        Thought Content: Thought content normal.        Judgment: Judgment normal.    Ataxic  R eye 20/200 L eye 20/100 Eye fundi limited exam is ok  Lab Results   Component Value Date   WBC 10.2 07/19/2023   HGB 16.6 07/19/2023   HCT 48.0 07/19/2023   PLT 199.0 07/19/2023   GLUCOSE 245 (H) 07/19/2023   CHOL 210 (H) 07/19/2023   TRIG 182.0 (H) 07/19/2023   HDL 44.30 07/19/2023   LDLDIRECT 100.0 12/09/2014   LDLCALC 129 (H) 07/19/2023  ALT 12 07/19/2023   AST 13 07/19/2023   NA 141 07/19/2023   K 3.9 07/19/2023   CL 101 07/19/2023   CREATININE 0.96 07/19/2023   BUN 10 07/19/2023   CO2 32 07/19/2023   TSH 3.55 07/19/2023   PSA 0.41 09/14/2017   INR 1.0 08/10/2022   HGBA1C 12.6 (H) 07/19/2023    No results found.  Assessment & Plan:   Problem List Items Addressed This Visit     Atrial fibrillation (HCC)   Off Eliquis , Xarelto  due to cost Discussed options, ie Coumadin On ASA 325 mg/mo Smoking again 1.5 PPD - discussed      Relevant Orders   Comprehensive metabolic panel with GFR   Vitamin B12   Hemoglobin A1c   CBC with Differential/Platelet   TSH   T4, free   Lipid panel   Diabetes mellitus type 2 in obese   Kel[place] lost a lot of wt Check labs Uncontrolled DM. Poor compliance Discussed      Relevant Orders   Comprehensive metabolic panel with GFR   Vitamin B12   Hemoglobin A1c   CBC with Differential/Platelet   TSH   T4, free   Lipid panel   Hypothyroidism   Check TSH      Relevant Orders   Comprehensive metabolic panel with GFR   Vitamin B12   Hemoglobin A1c   CBC with Differential/Platelet   TSH   T4, free   Lipid panel   Noncompliance   Risks associated with treatment noncompliance were discussed. Compliance was encouraged.       Tobacco dependence   Smoking 2 ppd - discussed      Vision loss, bilateral - Primary   Unclear etiology x 2 wks R eye 20/200 L eye 20/100 Uncontrolled DM. Poor compliance Ophth ref      Relevant Orders   Ambulatory referral to Ophthalmology   Other Visit Diagnoses       Paresthesia       Relevant Orders   Vitamin B12         No orders of the defined  types were placed in this encounter.     Follow-up: Return in about 3 months (around 03/15/2024) for a follow-up visit.  Marolyn Noel, MD

## 2023-12-15 NOTE — Assessment & Plan Note (Signed)
Risks associated with treatment noncompliance were discussed. Compliance was encouraged.

## 2023-12-15 NOTE — Assessment & Plan Note (Signed)
 Smoking 2 ppd - discussed

## 2023-12-15 NOTE — Assessment & Plan Note (Signed)
 Check TSH

## 2023-12-15 NOTE — Assessment & Plan Note (Signed)
 Off Eliquis, Xarelto due to cost Discussed options, ie Coumadin On ASA 325 mg/mo Smoking again 1.5 PPD - discussed

## 2023-12-15 NOTE — Assessment & Plan Note (Addendum)
 Yaiden lost a lot of wt Check labs Uncontrolled DM. Poor compliance Discussed

## 2023-12-28 NOTE — Progress Notes (Deleted)
 No chief complaint on file.  History of Present Illness: 68 yo male with history of paroxysmal atrial fibrillation, CAD, HTN, DM, CVA in 2024 and tobacco abuse who is here today for cardiac follow up. He was admitted to [facility] February 2018 with chest pain and was found to be in atrial fibrillation. 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 Echo February 2018 with normal LV systolic function. Cardiac cath 08/31/17 with severe stenosis in the proximal Circumflex treated with a drug eluting stent. The other stent in the Circumflex was patent.Thre was mild disease in the LAD and RCA. Last cardiac cath in July 2020 with mild LAD and RCA disease with patent Circumflex stents. He had am embolic stroke in May 2024. Last echo July 2024 with LVEF=50-55%. Moderate LVH. No valve disease. *** He continues to smoke.   He is here today for follow up. The patient denies any chest pain, dyspnea, palpitations, lower extremity edema, orthopnea, PND, dizziness, near syncope or syncope.   Primary Care Physician: Garald Karlynn GAILS, MD  Past Medical History:  Diagnosis Date   Acute embolic stroke (HCC) 08/13/2022   Allergy to bee sting    Anxiety    Arthritis    right knee (05/12/2016)   CAD (coronary artery disease)    a. DES to LCx 04/2016. b. DES to prox Cx 08/2017. c. Stable cath 09/2018 with nonobstructive residual disease, normal EF.   Carotid artery disease    Headache    when his sugar goes too high or too low (05/12/2016)   HTN (hypertension)    Hyperlipidemia    Hypothyroidism    Macular degeneration    Obesity    PAF (paroxysmal atrial fibrillation) (HCC)    Type II diabetes mellitus (HCC)     Past Surgical History:  Procedure Laterality Date   CARDIAC CATHETERIZATION     COLONOSCOPY     CORONARY ANGIOPLASTY WITH STENT PLACEMENT  05/12/2016   CORONARY STENT INTERVENTION N/A 05/12/2016   Procedure: Coronary Stent  Intervention;  Surgeon: Candyce GORMAN Reek, MD;  Location: [facility] INVASIVE CV LAB;  Service: Cardiovascular;  Laterality: N/A;   CORONARY STENT INTERVENTION N/A 08/31/2017   Procedure: CORONARY STENT INTERVENTION;  Surgeon: Reek Candyce GORMAN, MD;  Location: [facility] INVASIVE CV LAB;  Service: Cardiovascular;  Laterality: N/A;   GANGLION CYST EXCISION Left 1980s   KNEE ARTHROSCOPY Right    Meniscus removal   LEFT HEART CATH AND CORONARY ANGIOGRAPHY N/A 05/12/2016   Procedure: Left Heart Cath and Coronary Angiography;  Surgeon: Candyce GORMAN Reek, MD;  Location: [facility] INVASIVE CV LAB;  Service: Cardiovascular;  Laterality: N/A;   LEFT HEART CATH AND CORONARY ANGIOGRAPHY N/A 08/31/2017   Procedure: LEFT HEART CATH AND CORONARY ANGIOGRAPHY;  Surgeon: Reek Candyce GORMAN, MD;  Location: [facility] INVASIVE CV LAB;  Service: Cardiovascular;  Laterality: N/A;   LEFT HEART CATHETERIZATION WITH CORONARY ANGIOGRAM N/A 02/04/2012   Procedure: LEFT HEART CATHETERIZATION WITH CORONARY ANGIOGRAM;  Surgeon: Lonni JONETTA Cash, MD;  Location: [facility] CATH LAB;  Service: Cardiovascular;  Laterality: N/A;   POLYPECTOMY     RIGHT/LEFT HEART CATH AND CORONARY ANGIOGRAPHY N/A 09/28/2018   Procedure: RIGHT/LEFT HEART CATH AND CORONARY ANGIOGRAPHY;  Surgeon: Cash Lonni JONETTA, MD;  Location: [facility] INVASIVE CV LAB;  Service: Cardiovascular;  Laterality: N/A;    Current Outpatient Medications  Medication Sig Dispense Refill   acetaminophen  (TYLENOL ) 325 MG tablet Take 1-2 tablets (325-650 mg total) by mouth  every 6 (six) hours as needed for moderate pain or mild pain.     amiodarone  (PACERONE ) 200 MG tablet TAKE 1 TABLET (200 MG TOTAL) BY MOUTH DAILY. ANNUAL APPT DUE IN OCT MUST SEE PROVIDER FOR FUTURE REFILLS 90 tablet 3   apixaban  (ELIQUIS ) 5 MG TABS tablet Take 1 tablet (5 mg total) by mouth 2 (two) times daily. 60 tablet 6   Continuous Glucose Receiver (FREESTYLE LIBRE 3 READER) DEVI 1 each by Does not apply route daily. 1 each 3    Continuous Glucose Sensor (FREESTYLE LIBRE 3 SENSOR) MISC 1 each by Does not apply route daily. Place 1 sensor on the skin every 14 days. Use to check glucose continuously 2 each 3   Cyanocobalamin  (B-12) 500 MCG TABS TAKE 1 TABLET (500 MCG TOTAL) BY MOUTH DAILY. 90 tablet 3   diphenhydrAMINE  (BENADRYL ) 25 mg capsule Take 25 mg by mouth as needed (bee stings).     EPINEPHrine  (EPIPEN  2-PAK) 0.3 mg/0.3 mL IJ SOAJ injection Inject 0.3 mg into the muscle as needed for anaphylaxis. 2 each 1   furosemide  (LASIX ) 20 MG tablet Take 1 tablet (20 mg total) by mouth daily as needed. 30 tablet 3   irbesartan  (AVAPRO ) 150 MG tablet Take 1 tablet (150 mg total) by mouth daily. 30 tablet 11   levothyroxine  (SYNTHROID ) 175 MCG tablet TAKE 1 TABLET BY MOUTH DAILY AT 6 AM. 90 tablet 3   metFORMIN  (GLUCOPHAGE ) 1000 MG tablet Take 1 tablet (1,000 mg total) by mouth 2 (two) times daily with a meal. (Patient not taking: Reported on 12/15/2023) 60 tablet 0   metoprolol  succinate (TOPROL -XL) 25 MG 24 hr tablet Take 0.5 tablets (12.5 mg total) by mouth daily. 15 tablet 0   NIFEdipine  (PROCARDIA ) 20 MG capsule TAKE 1 CAPSULE (20 MG TOTAL) BY MOUTH DAILY. 90 capsule 3   nitroGLYCERIN  (NITROSTAT ) 0.4 MG SL tablet Place 1 tablet (0.4 mg total) under the tongue every 5 (five) minutes as needed for chest pain. 20 tablet 3   QUEtiapine  (SEROQUEL ) 50 MG tablet TAKE 1 TABLET (50 MG TOTAL) BY MOUTH AT BEDTIME. 90 tablet 1   repaglinide  (PRANDIN ) 2 MG tablet Take 1 tablet (2 mg total) by mouth 3 (three) times daily before meals. 90 tablet 11   rosuvastatin  (CRESTOR ) 40 MG tablet TAKE 1 TABLET BY MOUTH DAILY. **NEED OFFICE VISIT FOR FUTURE REFILLS.** 90 tablet 0   traZODone  (DESYREL ) 150 MG tablet TAKE 1/2 TABLET (75 MG TOTAL) BY MOUTH AT BEDTIME. 45 tablet 3   No current facility-administered medications for this visit.    Allergies  Allergen Reactions   Bee Venom Anaphylaxis    Other reaction(s): Unknown   Actos [Pioglitazone  Hydrochloride] Swelling   Lipitor [Atorvastatin] Other (See Comments)    Patient cannot recall reaction   Lisinopril  Other (See Comments)    cough    Social History   Socioeconomic History   Marital status: Married    Spouse name: Debbie   Number of children: 1   Years of education: Not on file   Highest education level: Not on file  Occupational History   Occupation: RETIRED/[employer]   Occupation: GRAVE DIGGER    Employer: [employer]    Comment: retired  Tobacco Use   Smoking status: Every Day    Current packs/day: 3.00    Average packs/day: 3.0 packs/day for 42.0 years (126.0 ttl pk-yrs)    Types: Cigarettes   Smokeless tobacco: Never   Tobacco comments:  Currently slightly less than 1 ppd  Vaping Use   Vaping status: Never Used  Substance and Sexual Activity   Alcohol use: Not Currently   Drug use: No   Sexual activity: Not Currently  Other Topics Concern   Not on file  Social History Narrative   Lives with wife/2025   Social Drivers of Health   Financial Resource Strain: Medium Risk (07/20/2023)   Overall Financial Resource Strain (CARDIA)    Difficulty of Paying Living Expenses: Somewhat hard  Food Insecurity: No Food Insecurity (07/20/2023)   Hunger Vital Sign    Worried About Running Out of Food in the Last Year: Never true    Ran Out of Food in the Last Year: Never true  Transportation Needs: No Transportation Needs (07/20/2023)   PRAPARE - Administrator, Civil Service (Medical): No    Lack of Transportation (Non-Medical): No  Physical Activity: Inactive (07/20/2023)   Exercise Vital Sign    Days of Exercise per Week: 0 days    Minutes of Exercise per Session: 0 min  Stress: No Stress Concern Present (07/20/2023)   [employer] of Occupational Health - Occupational Stress Questionnaire    Feeling of Stress : Not at all  Social Connections: Socially Isolated (07/20/2023)   Social Connection and Isolation Panel    Frequency of  Communication with Friends and Family: Once a week    Frequency of Social Gatherings with Friends and Family: Never    Attends Religious Services: Never    Database administrator or Organizations: No    Attends Banker Meetings: Never    Marital Status: Married  Catering manager Violence: Not At Risk (07/20/2023)   Humiliation, Afraid, Rape, and Kick questionnaire    Fear of Current or Ex-Partner: No    Emotionally Abused: No    Physically Abused: No    Sexually Abused: No    Family History  Problem Relation Age of Onset   Diabetes Mother    Cancer Mother        male ca   Colon cancer Mother 9   Diabetes Father    Cancer Father        stomach   CVA Father    Stomach cancer Father    Breast cancer Sister    Diabetes Sister    CAD Maternal Grandmother    CVA Paternal Uncle    Esophageal cancer Neg Hx    Rectal cancer Neg Hx     Review of Systems:  As stated in the HPI and otherwise negative.   There were no vitals taken for this visit.  Physical Examination: General: Well developed, well nourished, NAD  HEENT: OP clear, mucus membranes moist  SKIN: warm, dry. No rashes. Neuro: No focal deficits  Musculoskeletal: Muscle strength 5/5 all ext  Psychiatric: Mood and affect normal  Neck: No JVD, no carotid bruits, no thyromegaly, no lymphadenopathy.  Lungs:Clear bilaterally, no wheezes, rhonci, crackles Cardiovascular: Regular rate and rhythm. No murmurs, gallops or rubs. Abdomen:Soft. Bowel sounds present. Non-tender.  Extremities: No lower extremity edema. Pulses are 2 + in the bilateral DP/PT.  EKG:  EKG *** is ordered today. The ekg ordered today demonstrates   Recent Labs: 07/19/2023: ALT 12; BUN 10; Creatinine, Ser 0.96; Hemoglobin 16.6; Platelets 199.0; Potassium 3.9; Sodium 141; TSH 3.55   Lipid Panel    Component Value Date/Time   CHOL 210 (H) 07/19/2023 0919   CHOL 129 09/04/2020 0852   TRIG 182.0 (H)  07/19/2023 0919   TRIG 219 (HH)  02/08/2006 0757   HDL 44.30 07/19/2023 0919   HDL 47 09/04/2020 0852   CHOLHDL 5 07/19/2023 0919   VLDL 36.4 07/19/2023 0919   LDLCALC 129 (H) 07/19/2023 0919   LDLCALC 59 09/04/2020 0852   LDLDIRECT 100.0 12/09/2014 0827     Wt Readings from Last 3 Encounters:  12/15/23 169 lb 3.2 oz (76.7 kg)  07/20/23 166 lb (75.3 kg)  07/19/23 166 lb 3.2 oz (75.4 kg)    Assessment and Plan:   1. CAD with angina: No chest pain. Continue Toprol  and statin.   2. Atrial fibrillation, paroxysmal: Sinus today. Continue Toprol , amiodarone  and Eliquis   3. HTN: BP is controlled. Continue current meds  4. Hyperlipidemia: LDL 129 in April 2025. Continue Crestor . *** check lipids and LFTs now.   5.  Tobacco abuse: Smoking cessation is recommended.   Labs/ tests ordered today include:  No orders of the defined types were placed in this encounter.  Disposition:   F/U with me in one year.    Signed, Lonni Cash, MD 12/28/2023 3:13 PM    [facility] [address] [place], [place], [place]  [address] Phone: [phone]; Fax: [phone]

## 2023-12-29 ENCOUNTER — Ambulatory Visit: Attending: Cardiology | Admitting: Cardiovascular Disease

## 2024-01-23 ENCOUNTER — Other Ambulatory Visit: Payer: Self-pay | Admitting: Internal Medicine

## 2024-01-25 ENCOUNTER — Telehealth: Payer: Self-pay

## 2024-01-25 NOTE — Telephone Encounter (Signed)
 Copied from CRM [id number]. Topic: Clinical - Medical Advice >> Jan 25, 2024 12:45 PM Viola F wrote: Reason for CRM: Patient sister Nathanel would like to know how to move forward with getting patient and his wife Barnie (also a patient of Dr. Garald but dob unknown) into a nursing home? Please call Nathanel at [phone]

## 2024-01-25 NOTE — Telephone Encounter (Signed)
 Copied from CRM [id number]. Topic: Medical Record Request - Records Request >> Jan 25, 2024 12:41 PM Viola F wrote: Reason for CRM: Patient sister Nathanel would like patients med list mailed to her at: [facility] [address] [place], [place] [address]

## 2024-01-27 NOTE — Telephone Encounter (Signed)
 Printed pts med list and placed for outgoing mail as requested.

## 2024-01-27 NOTE — Telephone Encounter (Signed)
*  Attempted to reach pts sister in regards to Nursing home advice.  ** Please inform Anthony Woods of the following information upon her call back to the clinic....  Pts sister would have to reach out to the pts insurance and ask them which nursing home does the pt and his wife qualify for with their coverage in the area they live. Then Pts sister has to call the facilities to inquire about placement.  Once confirmed with facility they will take pt and wife in, Anthony Woods needs to know what all is needed for the pts to be placed (ex. FL2, Immunizations, etc.). Once info is confirmed she is to have pt and wife seen by PCP ASAP to have current OV notes for placement and evaluation by PCP as well for this placement.

## 2024-02-08 ENCOUNTER — Encounter (INDEPENDENT_AMBULATORY_CARE_PROVIDER_SITE_OTHER): Payer: Self-pay | Admitting: Ophthalmology

## 2024-02-14 ENCOUNTER — Other Ambulatory Visit: Payer: Self-pay | Admitting: Physical Medicine and Rehabilitation

## 2024-02-14 ENCOUNTER — Other Ambulatory Visit: Payer: Self-pay | Admitting: Nurse Practitioner

## 2024-02-14 ENCOUNTER — Telehealth: Payer: Self-pay

## 2024-02-14 ENCOUNTER — Other Ambulatory Visit: Payer: Self-pay | Admitting: Internal Medicine

## 2024-02-14 ENCOUNTER — Other Ambulatory Visit: Payer: Self-pay

## 2024-02-14 MED ORDER — METFORMIN HCL 1000 MG PO TABS: 1000.0000 mg | ORAL_TABLET | Freq: Two times a day (BID) | ORAL | 0 refills | Status: DC

## 2024-02-14 MED ORDER — AMIODARONE HCL 200 MG PO TABS: 200.0000 mg | ORAL_TABLET | Freq: Every day | ORAL | 3 refills | Status: AC

## 2024-02-14 NOTE — Telephone Encounter (Signed)
 Copied from CRM [id number]. Topic: Clinical - Medication Question >> Feb 14, 2024 11:42 AM Dedra B wrote: Reason for CRM: Pt sister, Nathanel, called to follow up on refill request sent by pharmacy for amiodarone  (PACERONE ) 200 MG tablet, metFORMIN  (GLUCOPHAGE ) 1000 MG tablet, traZODone  (DESYREL ) 150 MG tablet, and QUEtiapine  (SEROQUEL ) 50 MG tablet.

## 2024-02-14 NOTE — Telephone Encounter (Signed)
 Copied from CRM [id number]. Topic: General - Other >> Feb 14, 2024 11:45 AM Dedra NOVAK wrote: Reason for CRM: Pt sister, Nathanel, wanted to let Dr. Garald know that she thinks they will need the [facility] for pt.

## 2024-02-15 MED ORDER — TRAZODONE HCL 150 MG PO TABS: 150.0000 mg | ORAL_TABLET | Freq: Every day | ORAL | 3 refills | Status: DC

## 2024-02-15 MED ORDER — QUETIAPINE FUMARATE 50 MG PO TABS: 50.0000 mg | ORAL_TABLET | Freq: Every day | ORAL | 1 refills | Status: AC

## 2024-02-15 NOTE — Addendum Note (Signed)
 Addended by: Mariaguadalupe Fialkowski V on: 02/15/2024 07:54 AM   Modules accepted: Orders

## 2024-02-15 NOTE — Telephone Encounter (Signed)
 Done. Thanks.

## 2024-02-22 ENCOUNTER — Other Ambulatory Visit: Payer: Self-pay | Admitting: Pharmacist

## 2024-02-22 ENCOUNTER — Encounter (INDEPENDENT_AMBULATORY_CARE_PROVIDER_SITE_OTHER): Admitting: Ophthalmology

## 2024-02-22 DIAGNOSIS — I10 Essential (primary) hypertension: Secondary | ICD-10-CM | POA: Diagnosis not present

## 2024-02-22 DIAGNOSIS — E113392 Type 2 diabetes mellitus with moderate nonproliferative diabetic retinopathy without macular edema, left eye: Secondary | ICD-10-CM

## 2024-02-22 DIAGNOSIS — H43813 Vitreous degeneration, bilateral: Secondary | ICD-10-CM

## 2024-02-22 DIAGNOSIS — Z7984 Long term (current) use of oral hypoglycemic drugs: Secondary | ICD-10-CM | POA: Diagnosis not present

## 2024-02-22 DIAGNOSIS — H35712 Central serous chorioretinopathy, left eye: Secondary | ICD-10-CM | POA: Diagnosis not present

## 2024-02-22 DIAGNOSIS — H35033 Hypertensive retinopathy, bilateral: Secondary | ICD-10-CM

## 2024-02-22 NOTE — Progress Notes (Signed)
 Pharmacy Quality Measure Review  This patient is appearing on a report for being at risk of failing the Glycemic Status Assessment in Diabetes measure this calendar year.    Last documented A1c or GMI 12.6 on 07/19/23 Urine microalbumin not checked so far this year  Pt has upcoming appt scheduled 03/20/24. Entered urine microalbumin order and left note on appointment as a reminder.  Darrelyn Drum, PharmD, BCPS, CPP Clinical Pharmacist Practitioner [facility] [phone]

## 2024-02-27 NOTE — Telephone Encounter (Unsigned)
 Copied from CRM [id number]. Topic: General - Other >> Feb 27, 2024  3:26 PM Berneda FALCON wrote: Reason for CRM: Patient's sister, Nathanel calling in to check on the status of the [facility] form that Dr. Garald told her that he would do for this patient. She states this is not something she dropped off, but something Dr. SHAUNNA stated they would have and can complete for him. As of tomorrow, this will have been 2 weeks.  Please call Nathanel back at-[phone] Bobbetta sister, on [place])

## 2024-03-08 ENCOUNTER — Other Ambulatory Visit: Payer: Self-pay | Admitting: Internal Medicine

## 2024-03-20 ENCOUNTER — Ambulatory Visit: Admitting: Internal Medicine

## 2024-03-20 ENCOUNTER — Encounter: Payer: Self-pay | Admitting: Internal Medicine

## 2024-03-20 ENCOUNTER — Other Ambulatory Visit: Payer: Self-pay

## 2024-03-20 VITALS — BP 109/59 | HR 84 | Temp 97.9°F | Ht 65.0 in | Wt 159.0 lb

## 2024-03-20 DIAGNOSIS — I10 Essential (primary) hypertension: Secondary | ICD-10-CM

## 2024-03-20 DIAGNOSIS — F39 Unspecified mood [affective] disorder: Secondary | ICD-10-CM | POA: Diagnosis not present

## 2024-03-20 DIAGNOSIS — R609 Edema, unspecified: Secondary | ICD-10-CM

## 2024-03-20 DIAGNOSIS — E669 Obesity, unspecified: Secondary | ICD-10-CM

## 2024-03-20 DIAGNOSIS — E034 Atrophy of thyroid (acquired): Secondary | ICD-10-CM

## 2024-03-20 DIAGNOSIS — Z720 Tobacco use: Secondary | ICD-10-CM | POA: Insufficient documentation

## 2024-03-20 DIAGNOSIS — R202 Paresthesia of skin: Secondary | ICD-10-CM | POA: Diagnosis not present

## 2024-03-20 DIAGNOSIS — I480 Paroxysmal atrial fibrillation: Secondary | ICD-10-CM

## 2024-03-20 DIAGNOSIS — E7800 Pure hypercholesterolemia, unspecified: Secondary | ICD-10-CM | POA: Insufficient documentation

## 2024-03-20 DIAGNOSIS — Z6826 Body mass index (BMI) 26.0-26.9, adult: Secondary | ICD-10-CM

## 2024-03-20 DIAGNOSIS — E1165 Type 2 diabetes mellitus with hyperglycemia: Secondary | ICD-10-CM

## 2024-03-20 LAB — GLUCOSE, POCT (MANUAL RESULT ENTRY): POC Glucose: 187 mg/dL — AB (ref 70–99)

## 2024-03-20 LAB — CBC WITH DIFFERENTIAL/PLATELET
Basophils Absolute: 0.1 K/uL (ref 0.0–0.1)
Basophils Relative: 1.2 % (ref 0.0–3.0)
Eosinophils Absolute: 0.2 K/uL (ref 0.0–0.7)
Eosinophils Relative: 1.8 % (ref 0.0–5.0)
HCT: 41.7 % (ref 39.0–52.0)
Hemoglobin: 14.5 g/dL (ref 13.0–17.0)
Lymphocytes Relative: 14.4 % (ref 12.0–46.0)
Lymphs Abs: 1.3 K/uL (ref 0.7–4.0)
MCHC: 34.8 g/dL (ref 30.0–36.0)
MCV: 91.2 fl (ref 78.0–100.0)
Monocytes Absolute: 0.7 K/uL (ref 0.1–1.0)
Monocytes Relative: 7.5 % (ref 3.0–12.0)
Neutro Abs: 6.6 K/uL (ref 1.4–7.7)
Neutrophils Relative %: 75.1 % (ref 43.0–77.0)
Platelets: 209 K/uL (ref 150.0–400.0)
RBC: 4.57 Mil/uL (ref 4.22–5.81)
RDW: 13.4 % (ref 11.5–15.5)
WBC: 8.8 K/uL (ref 4.0–10.5)

## 2024-03-20 LAB — COMPREHENSIVE METABOLIC PANEL WITH GFR
ALT: 17 U/L (ref 3–53)
AST: 16 U/L (ref 5–37)
Albumin: 4.3 g/dL (ref 3.5–5.2)
Alkaline Phosphatase: 89 U/L (ref 39–117)
BUN: 22 mg/dL (ref 6–23)
CO2: 29 meq/L (ref 19–32)
Calcium: 9.2 mg/dL (ref 8.4–10.5)
Chloride: 99 meq/L (ref 96–112)
Creatinine, Ser: 1.76 mg/dL — ABNORMAL HIGH (ref 0.40–1.50)
GFR: 39.47 mL/min — ABNORMAL LOW
Glucose, Bld: 181 mg/dL — ABNORMAL HIGH (ref 70–99)
Potassium: 2.9 meq/L — ABNORMAL LOW (ref 3.5–5.1)
Sodium: 140 meq/L (ref 135–145)
Total Bilirubin: 0.4 mg/dL (ref 0.2–1.2)
Total Protein: 6.8 g/dL (ref 6.0–8.3)

## 2024-03-20 LAB — T4, FREE: Free T4: 1.57 ng/dL (ref 0.60–1.60)

## 2024-03-20 LAB — HEMOGLOBIN A1C: Hgb A1c MFr Bld: 11.1 % — ABNORMAL HIGH (ref 4.6–6.5)

## 2024-03-20 LAB — TSH: TSH: 5.47 u[IU]/mL (ref 0.35–5.50)

## 2024-03-20 LAB — LIPID PANEL
Cholesterol: 90 mg/dL (ref 28–200)
HDL: 26.5 mg/dL — ABNORMAL LOW
LDL Cholesterol: 24 mg/dL (ref 10–99)
NonHDL: 63.25
Total CHOL/HDL Ratio: 3
Triglycerides: 195 mg/dL — ABNORMAL HIGH (ref 10.0–149.0)
VLDL: 39 mg/dL (ref 0.0–40.0)

## 2024-03-20 LAB — VITAMIN B12: Vitamin B-12: 1320 pg/mL — ABNORMAL HIGH (ref 211–911)

## 2024-03-20 MED ORDER — REPAGLINIDE 2 MG PO TABS: 1.0000 mg | ORAL_TABLET | Freq: Three times a day (TID) | ORAL | Status: AC

## 2024-03-20 NOTE — Assessment & Plan Note (Signed)
 Resolved

## 2024-03-20 NOTE — Assessment & Plan Note (Signed)
 Anthony Woods lost a lot of wt BP Readings from Last 3 Encounters:  03/20/24 (!) 109/59  12/15/23 (!) 178/86  07/19/23 (!) 140/82

## 2024-03-20 NOTE — Assessment & Plan Note (Signed)
 Labs Reduce Prandin  prn to 1/2 tab

## 2024-03-20 NOTE — Progress Notes (Signed)
 "  Subjective:  Patient ID: Anthony Woods, male    DOB: 06-Dec-1955  Age: 68 y.o. MRN: [id number]  CC: Medical Management of Chronic Issues (3 MNTH F/U, Pt states he is not feeling good, fatigue and dizziness. Pt sugar level have been dropping below 70/60.. Did a POCT CBG it is at 187 and pt is fasting)   HPI Anthony Woods presents for DM, h/o CVA, FTT [name] is at the [facility] stays w/his brother  Outpatient Medications Prior to Visit  Medication Sig Dispense Refill   acetaminophen  (TYLENOL ) 325 MG tablet Take 1-2 tablets (325-650 mg total) by mouth every 6 (six) hours as needed for moderate pain or mild pain.     amiodarone  (PACERONE ) 200 MG tablet Take 1 tablet (200 mg total) by mouth daily. Annual appt due in Oct must see provider for future refills 90 tablet 3   apixaban  (ELIQUIS ) 5 MG TABS tablet Take 1 tablet (5 mg total) by mouth 2 (two) times daily. 60 tablet 6   Continuous Glucose Receiver (FREESTYLE LIBRE 3 READER) DEVI 1 each by Does not apply route daily. 1 each 3   Continuous Glucose Sensor (FREESTYLE LIBRE 3 SENSOR) MISC 1 each by Does not apply route daily. Place 1 sensor on the skin every 14 days. Use to check glucose continuously 2 each 3   Cyanocobalamin  (B-12) 500 MCG TABS TAKE 1 TABLET (500 MCG TOTAL) BY MOUTH DAILY. 90 tablet 3   diphenhydrAMINE  (BENADRYL ) 25 mg capsule Take 25 mg by mouth as needed (bee stings).     EPINEPHrine  (EPIPEN  2-PAK) 0.3 mg/0.3 mL IJ SOAJ injection Inject 0.3 mg into the muscle as needed for anaphylaxis. 2 each 1   furosemide  (LASIX ) 20 MG tablet Take 1 tablet (20 mg total) by mouth daily as needed. 30 tablet 3   irbesartan  (AVAPRO ) 150 MG tablet Take 1 tablet (150 mg total) by mouth daily. 30 tablet 11   levothyroxine  (SYNTHROID ) 175 MCG tablet TAKE 1 TABLET BY MOUTH DAILY AT 6:00 AM. 90 tablet 3   metFORMIN  (GLUCOPHAGE ) 1000 MG tablet TAKE 1 TABLET BY MOUTH 2 TIMES DAILY WITH A MEAL. 60 tablet 0   metoprolol  succinate (TOPROL -XL) 25 MG 24 hr  tablet Take 0.5 tablets (12.5 mg total) by mouth daily. 15 tablet 0   NIFEdipine  (PROCARDIA ) 20 MG capsule TAKE 1 CAPSULE (20 MG TOTAL) BY MOUTH DAILY. 90 capsule 3   nitroGLYCERIN  (NITROSTAT ) 0.4 MG SL tablet Place 1 tablet (0.4 mg total) under the tongue every 5 (five) minutes as needed for chest pain. 20 tablet 3   QUEtiapine  (SEROQUEL ) 50 MG tablet Take 1 tablet (50 mg total) by mouth at bedtime. 90 tablet 1   rosuvastatin  (CRESTOR ) 40 MG tablet Take 1 tablet (40 mg total) by mouth daily. 30 tablet 0   traZODone  (DESYREL ) 150 MG tablet Take 1 tablet (150 mg total) by mouth at bedtime. TAKE 1/2 TABLET (75 MG TOTAL) BY MOUTH AT BEDTIME. 45 tablet 3   repaglinide  (PRANDIN ) 2 MG tablet Take 1 tablet (2 mg total) by mouth 3 (three) times daily before meals. 90 tablet 11   No facility-administered medications prior to visit.    ROS: Review of Systems  Constitutional:  Negative for appetite change, fatigue and unexpected weight change.  HENT:  Negative for congestion, nosebleeds, sneezing, sore throat and trouble swallowing.   Eyes:  Negative for itching and visual disturbance.  Respiratory:  Negative for cough.   Cardiovascular:  Negative for chest pain,  palpitations and leg swelling.  Gastrointestinal:  Negative for abdominal distention, blood in stool, diarrhea and nausea.  Genitourinary:  Negative for frequency and hematuria.  Musculoskeletal:  Negative for back pain, gait problem, joint swelling and neck pain.  Skin:  Negative for rash.  Neurological:  Negative for dizziness, tremors, speech difficulty and weakness.  Psychiatric/Behavioral:  Positive for decreased concentration and dysphoric mood. Negative for agitation, sleep disturbance and suicidal ideas. The patient is nervous/anxious.     Objective:  BP (!) 109/59   Pulse 84   Temp 97.9 F (36.6 C) (Oral)   Ht 5' 5 (1.651 m)   Wt 159 lb (72.1 kg)   SpO2 95%   BMI 26.46 kg/m   BP Readings from Last 3 Encounters:  03/20/24  (!) 109/59  12/15/23 (!) 178/86  07/19/23 (!) 140/82    Wt Readings from Last 3 Encounters:  03/20/24 159 lb (72.1 kg)  12/15/23 169 lb 3.2 oz (76.7 kg)  07/20/23 166 lb (75.3 kg)    Physical Exam Constitutional:      General: He is not in acute distress.    Appearance: Normal appearance. He is well-developed.     Comments: NAD  Eyes:     Conjunctiva/sclera: Conjunctivae normal.     Pupils: Pupils are equal, round, and reactive to light.  Neck:     Thyroid : No thyromegaly.     Vascular: No JVD.  Cardiovascular:     Rate and Rhythm: Normal rate and regular rhythm.     Heart sounds: Normal heart sounds. No murmur heard.    No friction rub. No gallop.  Pulmonary:     Effort: Pulmonary effort is normal. No respiratory distress.     Breath sounds: Normal breath sounds. No wheezing or rales.  Chest:     Chest wall: No tenderness.  Abdominal:     General: Bowel sounds are normal. There is no distension.     Palpations: Abdomen is soft. There is no mass.     Tenderness: There is no abdominal tenderness. There is no guarding or rebound.  Musculoskeletal:        General: No tenderness. Normal range of motion.     Cervical back: Normal range of motion.  Lymphadenopathy:     Cervical: No cervical adenopathy.  Skin:    General: Skin is warm and dry.     Findings: No rash.  Neurological:     Mental Status: He is alert. He is disoriented.     Cranial Nerves: No cranial nerve deficit.     Motor: Weakness present. No abnormal muscle tone.     Coordination: Coordination abnormal.     Gait: Gait abnormal.     Deep Tendon Reflexes: Reflexes are normal and symmetric.  Psychiatric:        Behavior: Behavior normal.        Thought Content: Thought content normal.        Judgment: Judgment normal.   Using a walker Sad   Lab Results  Component Value Date   WBC 10.2 07/19/2023   HGB 16.6 07/19/2023   HCT 48.0 07/19/2023   PLT 199.0 07/19/2023   GLUCOSE 245 (H) 07/19/2023    CHOL 210 (H) 07/19/2023   TRIG 182.0 (H) 07/19/2023   HDL 44.30 07/19/2023   LDLDIRECT 100.0 12/09/2014   LDLCALC 129 (H) 07/19/2023   ALT 12 07/19/2023   AST 13 07/19/2023   NA 141 07/19/2023   K 3.9 07/19/2023   CL 101 07/19/2023  CREATININE 0.96 07/19/2023   BUN 10 07/19/2023   CO2 32 07/19/2023   TSH 3.55 07/19/2023   PSA 0.41 09/14/2017   INR 1.0 08/10/2022   HGBA1C 12.6 (H) 07/19/2023    No results found.  Assessment & Plan:   Problem List Items Addressed This Visit     Type 2 diabetes mellitus in patient with obesity (HCC)   Haze lost a lot of wt Check labs  Discussed      Relevant Medications   repaglinide  (PRANDIN ) 2 MG tablet   Other Relevant Orders   Comprehensive metabolic panel with GFR   TSH   Hemoglobin A1c   CBC with Differential/Platelet   HTN (hypertension)   [place] lost a lot of wt BP Readings from Last 3 Encounters:  03/20/24 (!) 109/59  12/15/23 (!) 178/86  07/19/23 (!) 140/82         Relevant Orders   Comprehensive metabolic panel with GFR   TSH   Hemoglobin A1c   CBC with Differential/Platelet   Edema   Resolved      Relevant Orders   Comprehensive metabolic panel with GFR   TSH   Hemoglobin A1c   CBC with Differential/Platelet   Mood disorder   Coping ok [name] is at the [facility] stays w/his brother      Hyperglycemia due to type 2 diabetes mellitus (HCC) - Primary   Labs Reduce Prandin  prn to 1/2 tab      Relevant Medications   repaglinide  (PRANDIN ) 2 MG tablet   Other Relevant Orders   POCT Glucose (CBG) (Completed)      Meds ordered this encounter  Medications   repaglinide  (PRANDIN ) 2 MG tablet    Sig: Take 0.5 tablets (1 mg total) by mouth 3 (three) times daily before meals.      Follow-up: Return in about 3 months (around 06/18/2024) for a follow-up visit.  Marolyn Noel, MD "

## 2024-03-20 NOTE — Assessment & Plan Note (Signed)
 Coping ok Anthony Woods is at the [facility] stays w/his brother

## 2024-03-20 NOTE — Assessment & Plan Note (Signed)
 Anthony Woods lost a lot of wt Check labs  Discussed

## 2024-03-23 ENCOUNTER — Ambulatory Visit: Payer: Self-pay | Admitting: Internal Medicine

## 2024-03-23 MED ORDER — POTASSIUM CHLORIDE ER 10 MEQ PO TBCR: 10.0000 meq | EXTENDED_RELEASE_TABLET | Freq: Two times a day (BID) | ORAL | 3 refills | Status: AC

## 2024-03-29 ENCOUNTER — Other Ambulatory Visit: Payer: Self-pay | Admitting: Internal Medicine

## 2024-04-05 ENCOUNTER — Encounter: Payer: Self-pay | Admitting: Internal Medicine

## 2024-04-05 ENCOUNTER — Telehealth: Payer: Self-pay

## 2024-04-05 NOTE — Telephone Encounter (Signed)
 Copied from CRM [id number]. Topic: General - Other >> Apr 05, 2024  1:41 PM Alfonso HERO wrote: Reason for CRM: patient sister calling for status of FL2 form that was discussed during patients last visit. She also mentioned the patient has loose bowels and she thinks it because the patient wont really eat anything and asking if there is something that can be given to increase his appetite.

## 2024-04-06 ENCOUNTER — Telehealth: Payer: Self-pay

## 2024-04-06 ENCOUNTER — Other Ambulatory Visit: Payer: Self-pay

## 2024-04-06 NOTE — Telephone Encounter (Signed)
 Pt is needing a PA for apixaban  (ELIQUIS ) 5 MG TABS tablet due to CVA (History of CVA (cerebrovascular accident) (Chronic) )

## 2024-04-06 NOTE — Telephone Encounter (Signed)
 I was able to inform pts sister that [facility] form is signed and ready for pick up.  She has stated understanding and will come in soon as she can. She will also be dropping off a copy of the POA paperwork to be added to pts chart.

## 2024-04-09 ENCOUNTER — Telehealth: Payer: Self-pay

## 2024-04-09 ENCOUNTER — Other Ambulatory Visit (HOSPITAL_COMMUNITY): Payer: Self-pay

## 2024-04-09 NOTE — Telephone Encounter (Signed)
 Pharmacy Patient Advocate Encounter   Received notification from Physician's Office that prior authorization for Eliquis  5 mg tablets is required/requested.   Insurance verification completed.   The patient is insured through [employer] .   Per test claim: The current 30 day co-pay is, $124.38.  No PA needed at this time. This test claim was processed through [facility]- copay amounts may vary at other pharmacies due to pharmacy/plan contracts, or as the patient moves through the different stages of their insurance plan.

## 2024-04-14 ENCOUNTER — Other Ambulatory Visit: Payer: Self-pay | Admitting: Internal Medicine

## 2024-04-14 MED ORDER — MIRTAZAPINE 15 MG PO TABS: ORAL_TABLET | ORAL | 5 refills | Status: AC

## 2024-04-14 NOTE — Progress Notes (Signed)
 message

## 2024-04-18 ENCOUNTER — Other Ambulatory Visit: Payer: Self-pay | Admitting: Physical Medicine and Rehabilitation

## 2024-06-18 ENCOUNTER — Ambulatory Visit: Admitting: Internal Medicine

## 2024-07-20 ENCOUNTER — Ambulatory Visit

## 2024-08-22 ENCOUNTER — Encounter (INDEPENDENT_AMBULATORY_CARE_PROVIDER_SITE_OTHER): Admitting: Ophthalmology
